# Patient Record
Sex: Male | Born: 1940
Health system: Southern US, Community
[De-identification: ages and names within clinical notes are randomized; demographics above are authoritative.]

## PROBLEM LIST (undated history)

## (undated) DIAGNOSIS — I4510 Unspecified right bundle-branch block: Secondary | ICD-10-CM

## (undated) DIAGNOSIS — A419 Sepsis, unspecified organism: Secondary | ICD-10-CM

## (undated) DIAGNOSIS — N400 Enlarged prostate without lower urinary tract symptoms: Secondary | ICD-10-CM

## (undated) DIAGNOSIS — N1831 Chronic kidney disease, stage 3a: Secondary | ICD-10-CM

## (undated) DIAGNOSIS — I2510 Atherosclerotic heart disease of native coronary artery without angina pectoris: Secondary | ICD-10-CM

## (undated) DIAGNOSIS — C801 Malignant (primary) neoplasm, unspecified: Secondary | ICD-10-CM

## (undated) DIAGNOSIS — H9190 Unspecified hearing loss, unspecified ear: Secondary | ICD-10-CM

## (undated) DIAGNOSIS — R0609 Other forms of dyspnea: Secondary | ICD-10-CM

## (undated) DIAGNOSIS — K219 Gastro-esophageal reflux disease without esophagitis: Secondary | ICD-10-CM

## (undated) DIAGNOSIS — I4891 Unspecified atrial fibrillation: Secondary | ICD-10-CM

## (undated) DIAGNOSIS — F419 Anxiety disorder, unspecified: Secondary | ICD-10-CM

## (undated) DIAGNOSIS — I493 Ventricular premature depolarization: Secondary | ICD-10-CM

## (undated) DIAGNOSIS — K4091 Unilateral inguinal hernia, without obstruction or gangrene, recurrent: Secondary | ICD-10-CM

## (undated) DIAGNOSIS — K250 Acute gastric ulcer with hemorrhage: Secondary | ICD-10-CM

## (undated) DIAGNOSIS — K5790 Diverticulosis of intestine, part unspecified, without perforation or abscess without bleeding: Secondary | ICD-10-CM

## (undated) DIAGNOSIS — H353290 Exudative age-related macular degeneration, unspecified eye, stage unspecified: Secondary | ICD-10-CM

## (undated) DIAGNOSIS — E785 Hyperlipidemia, unspecified: Secondary | ICD-10-CM

## (undated) DIAGNOSIS — M4186 Other forms of scoliosis, lumbar region: Secondary | ICD-10-CM

## (undated) DIAGNOSIS — M1990 Unspecified osteoarthritis, unspecified site: Secondary | ICD-10-CM

## (undated) DIAGNOSIS — T7840XA Allergy, unspecified, initial encounter: Secondary | ICD-10-CM

## (undated) DIAGNOSIS — R0600 Dyspnea, unspecified: Secondary | ICD-10-CM

## (undated) DIAGNOSIS — K4090 Unilateral inguinal hernia, without obstruction or gangrene, not specified as recurrent: Secondary | ICD-10-CM

## (undated) DIAGNOSIS — Z7902 Long term (current) use of antithrombotics/antiplatelets: Secondary | ICD-10-CM

## (undated) DIAGNOSIS — K635 Polyp of colon: Secondary | ICD-10-CM

## (undated) DIAGNOSIS — F329 Major depressive disorder, single episode, unspecified: Secondary | ICD-10-CM

## (undated) DIAGNOSIS — I255 Ischemic cardiomyopathy: Secondary | ICD-10-CM

## (undated) DIAGNOSIS — I509 Heart failure, unspecified: Secondary | ICD-10-CM

## (undated) DIAGNOSIS — K4020 Bilateral inguinal hernia, without obstruction or gangrene, not specified as recurrent: Secondary | ICD-10-CM

## (undated) DIAGNOSIS — H9313 Tinnitus, bilateral: Secondary | ICD-10-CM

## (undated) DIAGNOSIS — R7303 Prediabetes: Secondary | ICD-10-CM

## (undated) DIAGNOSIS — F32A Depression, unspecified: Secondary | ICD-10-CM

## (undated) DIAGNOSIS — K589 Irritable bowel syndrome without diarrhea: Secondary | ICD-10-CM

## (undated) DIAGNOSIS — J189 Pneumonia, unspecified organism: Secondary | ICD-10-CM

## (undated) DIAGNOSIS — E079 Disorder of thyroid, unspecified: Secondary | ICD-10-CM

## (undated) DIAGNOSIS — K439 Ventral hernia without obstruction or gangrene: Secondary | ICD-10-CM

## (undated) DIAGNOSIS — D649 Anemia, unspecified: Secondary | ICD-10-CM

## (undated) DIAGNOSIS — I700 Atherosclerosis of aorta: Secondary | ICD-10-CM

## (undated) DIAGNOSIS — I219 Acute myocardial infarction, unspecified: Secondary | ICD-10-CM

## (undated) DIAGNOSIS — E039 Hypothyroidism, unspecified: Secondary | ICD-10-CM

## (undated) DIAGNOSIS — I499 Cardiac arrhythmia, unspecified: Secondary | ICD-10-CM

## (undated) DIAGNOSIS — I10 Essential (primary) hypertension: Secondary | ICD-10-CM

## (undated) HISTORY — DX: Essential (primary) hypertension: I10

## (undated) HISTORY — DX: Irritable bowel syndrome, unspecified: K58.9

## (undated) HISTORY — DX: Depression, unspecified: F32.A

## (undated) HISTORY — DX: Major depressive disorder, single episode, unspecified: F32.9

## (undated) HISTORY — DX: Anxiety disorder, unspecified: F41.9

## (undated) HISTORY — DX: Hyperlipidemia, unspecified: E78.5

## (undated) HISTORY — DX: Malignant (primary) neoplasm, unspecified: C80.1

## (undated) HISTORY — DX: Allergy, unspecified, initial encounter: T78.40XA

## (undated) HISTORY — DX: Gastro-esophageal reflux disease without esophagitis: K21.9

## (undated) HISTORY — PX: VASECTOMY: SHX75

## (undated) HISTORY — PX: CARDIAC CATHETERIZATION: SHX172

## (undated) HISTORY — PX: EYE SURGERY: SHX253

## (undated) HISTORY — PX: JOINT REPLACEMENT: SHX530

## (undated) HISTORY — DX: Disorder of thyroid, unspecified: E07.9

## (undated) HISTORY — PX: TONSILLECTOMY: SUR1361

---

## 1966-12-25 HISTORY — PX: CHOLECYSTECTOMY: SHX55

## 1996-02-26 HISTORY — PX: HERNIA REPAIR: SHX51

## 1996-04-18 HISTORY — PX: HERNIA REPAIR: SHX51

## 2002-08-07 HISTORY — PX: HERNIA REPAIR: SHX51

## 2009-01-06 ENCOUNTER — Ambulatory Visit: Payer: Self-pay | Admitting: Family Medicine

## 2009-02-02 ENCOUNTER — Ambulatory Visit: Payer: Self-pay | Admitting: Family Medicine

## 2009-12-01 ENCOUNTER — Ambulatory Visit: Payer: Self-pay | Admitting: Cardiology

## 2012-01-02 DIAGNOSIS — M238X9 Other internal derangements of unspecified knee: Secondary | ICD-10-CM | POA: Diagnosis not present

## 2012-01-08 ENCOUNTER — Ambulatory Visit: Payer: Self-pay

## 2012-01-08 DIAGNOSIS — M7120 Synovial cyst of popliteal space [Baker], unspecified knee: Secondary | ICD-10-CM | POA: Diagnosis not present

## 2012-01-08 DIAGNOSIS — IMO0002 Reserved for concepts with insufficient information to code with codable children: Secondary | ICD-10-CM | POA: Diagnosis not present

## 2012-01-29 DIAGNOSIS — L819 Disorder of pigmentation, unspecified: Secondary | ICD-10-CM | POA: Diagnosis not present

## 2012-01-29 DIAGNOSIS — L723 Sebaceous cyst: Secondary | ICD-10-CM | POA: Diagnosis not present

## 2012-01-30 DIAGNOSIS — M238X9 Other internal derangements of unspecified knee: Secondary | ICD-10-CM | POA: Diagnosis not present

## 2012-02-05 ENCOUNTER — Ambulatory Visit: Payer: Self-pay | Admitting: General Practice

## 2012-02-05 DIAGNOSIS — Z01812 Encounter for preprocedural laboratory examination: Secondary | ICD-10-CM | POA: Diagnosis not present

## 2012-02-05 DIAGNOSIS — M2390 Unspecified internal derangement of unspecified knee: Secondary | ICD-10-CM | POA: Diagnosis not present

## 2012-02-07 ENCOUNTER — Ambulatory Visit: Payer: Self-pay | Admitting: Anesthesiology

## 2012-02-07 DIAGNOSIS — I2510 Atherosclerotic heart disease of native coronary artery without angina pectoris: Secondary | ICD-10-CM | POA: Diagnosis not present

## 2012-02-07 DIAGNOSIS — Z87891 Personal history of nicotine dependence: Secondary | ICD-10-CM | POA: Diagnosis not present

## 2012-02-07 DIAGNOSIS — Z79899 Other long term (current) drug therapy: Secondary | ICD-10-CM | POA: Diagnosis not present

## 2012-02-07 DIAGNOSIS — Z01812 Encounter for preprocedural laboratory examination: Secondary | ICD-10-CM | POA: Diagnosis not present

## 2012-02-07 DIAGNOSIS — M2390 Unspecified internal derangement of unspecified knee: Secondary | ICD-10-CM | POA: Diagnosis not present

## 2012-02-07 DIAGNOSIS — M23329 Other meniscus derangements, posterior horn of medial meniscus, unspecified knee: Secondary | ICD-10-CM | POA: Diagnosis not present

## 2012-02-07 LAB — BASIC METABOLIC PANEL
Chloride: 104 mmol/L (ref 98–107)
Co2: 30 mmol/L (ref 21–32)
Creatinine: 0.89 mg/dL (ref 0.60–1.30)
Potassium: 4.5 mmol/L (ref 3.5–5.1)
Sodium: 142 mmol/L (ref 136–145)

## 2012-02-07 LAB — CBC WITH DIFFERENTIAL/PLATELET
Basophil #: 0 10*3/uL (ref 0.0–0.1)
Basophil %: 0.4 %
Eosinophil #: 0.1 10*3/uL (ref 0.0–0.7)
MCHC: 33.5 g/dL (ref 32.0–36.0)
MCV: 96 fL (ref 80–100)
Monocyte #: 0.4 10*3/uL (ref 0.0–0.7)
Neutrophil #: 3.4 10*3/uL (ref 1.4–6.5)
RBC: 4.77 10*6/uL (ref 4.40–5.90)
WBC: 6 10*3/uL (ref 3.8–10.6)

## 2012-02-14 ENCOUNTER — Ambulatory Visit: Payer: Self-pay | Admitting: General Practice

## 2012-02-14 DIAGNOSIS — I2510 Atherosclerotic heart disease of native coronary artery without angina pectoris: Secondary | ICD-10-CM | POA: Diagnosis not present

## 2012-02-14 DIAGNOSIS — M9420 Chondromalacia, unspecified site: Secondary | ICD-10-CM | POA: Diagnosis not present

## 2012-02-14 DIAGNOSIS — M129 Arthropathy, unspecified: Secondary | ICD-10-CM | POA: Diagnosis not present

## 2012-02-14 DIAGNOSIS — IMO0001 Reserved for inherently not codable concepts without codable children: Secondary | ICD-10-CM | POA: Diagnosis not present

## 2012-02-14 DIAGNOSIS — E079 Disorder of thyroid, unspecified: Secondary | ICD-10-CM | POA: Diagnosis not present

## 2012-02-14 DIAGNOSIS — M2390 Unspecified internal derangement of unspecified knee: Secondary | ICD-10-CM | POA: Diagnosis not present

## 2012-02-14 DIAGNOSIS — M2240 Chondromalacia patellae, unspecified knee: Secondary | ICD-10-CM | POA: Diagnosis not present

## 2012-02-14 DIAGNOSIS — Z8262 Family history of osteoporosis: Secondary | ICD-10-CM | POA: Diagnosis not present

## 2012-02-14 DIAGNOSIS — Z79899 Other long term (current) drug therapy: Secondary | ICD-10-CM | POA: Diagnosis not present

## 2012-02-14 DIAGNOSIS — Z87891 Personal history of nicotine dependence: Secondary | ICD-10-CM | POA: Diagnosis not present

## 2012-02-14 DIAGNOSIS — M23329 Other meniscus derangements, posterior horn of medial meniscus, unspecified knee: Secondary | ICD-10-CM | POA: Diagnosis not present

## 2012-02-14 HISTORY — PX: KNEE ARTHROSCOPY: SUR90

## 2012-04-15 DIAGNOSIS — J029 Acute pharyngitis, unspecified: Secondary | ICD-10-CM | POA: Diagnosis not present

## 2012-04-15 DIAGNOSIS — E039 Hypothyroidism, unspecified: Secondary | ICD-10-CM | POA: Diagnosis not present

## 2012-04-15 DIAGNOSIS — H6690 Otitis media, unspecified, unspecified ear: Secondary | ICD-10-CM | POA: Diagnosis not present

## 2012-05-15 DIAGNOSIS — H6120 Impacted cerumen, unspecified ear: Secondary | ICD-10-CM | POA: Diagnosis not present

## 2012-05-15 DIAGNOSIS — H903 Sensorineural hearing loss, bilateral: Secondary | ICD-10-CM | POA: Diagnosis not present

## 2012-05-29 DIAGNOSIS — E039 Hypothyroidism, unspecified: Secondary | ICD-10-CM | POA: Diagnosis not present

## 2012-05-29 DIAGNOSIS — E785 Hyperlipidemia, unspecified: Secondary | ICD-10-CM | POA: Diagnosis not present

## 2012-05-29 DIAGNOSIS — M549 Dorsalgia, unspecified: Secondary | ICD-10-CM | POA: Diagnosis not present

## 2012-05-29 DIAGNOSIS — I2510 Atherosclerotic heart disease of native coronary artery without angina pectoris: Secondary | ICD-10-CM | POA: Diagnosis not present

## 2012-05-30 DIAGNOSIS — E785 Hyperlipidemia, unspecified: Secondary | ICD-10-CM | POA: Diagnosis not present

## 2012-05-30 DIAGNOSIS — E039 Hypothyroidism, unspecified: Secondary | ICD-10-CM | POA: Diagnosis not present

## 2012-05-30 DIAGNOSIS — Z79899 Other long term (current) drug therapy: Secondary | ICD-10-CM | POA: Diagnosis not present

## 2012-06-05 DIAGNOSIS — R0609 Other forms of dyspnea: Secondary | ICD-10-CM | POA: Diagnosis not present

## 2012-06-05 DIAGNOSIS — I4949 Other premature depolarization: Secondary | ICD-10-CM | POA: Diagnosis not present

## 2012-06-05 DIAGNOSIS — R0989 Other specified symptoms and signs involving the circulatory and respiratory systems: Secondary | ICD-10-CM | POA: Diagnosis not present

## 2012-08-07 DIAGNOSIS — R42 Dizziness and giddiness: Secondary | ICD-10-CM | POA: Diagnosis not present

## 2012-08-07 DIAGNOSIS — R5381 Other malaise: Secondary | ICD-10-CM | POA: Diagnosis not present

## 2012-08-07 DIAGNOSIS — E785 Hyperlipidemia, unspecified: Secondary | ICD-10-CM | POA: Diagnosis not present

## 2012-08-07 DIAGNOSIS — E039 Hypothyroidism, unspecified: Secondary | ICD-10-CM | POA: Diagnosis not present

## 2012-08-16 DIAGNOSIS — I10 Essential (primary) hypertension: Secondary | ICD-10-CM | POA: Diagnosis not present

## 2012-08-16 DIAGNOSIS — R9431 Abnormal electrocardiogram [ECG] [EKG]: Secondary | ICD-10-CM | POA: Diagnosis not present

## 2012-08-16 DIAGNOSIS — I4891 Unspecified atrial fibrillation: Secondary | ICD-10-CM | POA: Diagnosis not present

## 2012-08-16 DIAGNOSIS — R42 Dizziness and giddiness: Secondary | ICD-10-CM | POA: Diagnosis not present

## 2012-09-17 DIAGNOSIS — M1710 Unilateral primary osteoarthritis, unspecified knee: Secondary | ICD-10-CM | POA: Diagnosis not present

## 2012-09-19 DIAGNOSIS — Z23 Encounter for immunization: Secondary | ICD-10-CM | POA: Diagnosis not present

## 2012-11-27 DIAGNOSIS — Z1212 Encounter for screening for malignant neoplasm of rectum: Secondary | ICD-10-CM | POA: Diagnosis not present

## 2012-11-27 DIAGNOSIS — Z125 Encounter for screening for malignant neoplasm of prostate: Secondary | ICD-10-CM | POA: Diagnosis not present

## 2012-11-27 DIAGNOSIS — Z1339 Encounter for screening examination for other mental health and behavioral disorders: Secondary | ICD-10-CM | POA: Diagnosis not present

## 2012-11-27 DIAGNOSIS — Z1331 Encounter for screening for depression: Secondary | ICD-10-CM | POA: Diagnosis not present

## 2012-11-27 DIAGNOSIS — Z0000 Encounter for general adult medical examination without abnormal findings: Secondary | ICD-10-CM | POA: Diagnosis not present

## 2012-11-27 DIAGNOSIS — R9431 Abnormal electrocardiogram [ECG] [EKG]: Secondary | ICD-10-CM | POA: Diagnosis not present

## 2012-12-03 DIAGNOSIS — I4949 Other premature depolarization: Secondary | ICD-10-CM | POA: Diagnosis not present

## 2012-12-03 DIAGNOSIS — I2510 Atherosclerotic heart disease of native coronary artery without angina pectoris: Secondary | ICD-10-CM | POA: Diagnosis not present

## 2012-12-25 HISTORY — PX: COLONOSCOPY: SHX174

## 2013-04-03 DIAGNOSIS — Z1339 Encounter for screening examination for other mental health and behavioral disorders: Secondary | ICD-10-CM | POA: Diagnosis not present

## 2013-04-03 DIAGNOSIS — R9431 Abnormal electrocardiogram [ECG] [EKG]: Secondary | ICD-10-CM | POA: Diagnosis not present

## 2013-04-03 DIAGNOSIS — J320 Chronic maxillary sinusitis: Secondary | ICD-10-CM | POA: Diagnosis not present

## 2013-04-03 DIAGNOSIS — Z1331 Encounter for screening for depression: Secondary | ICD-10-CM | POA: Diagnosis not present

## 2013-04-21 ENCOUNTER — Ambulatory Visit: Payer: Self-pay | Admitting: Gastroenterology

## 2013-04-21 DIAGNOSIS — E079 Disorder of thyroid, unspecified: Secondary | ICD-10-CM | POA: Diagnosis not present

## 2013-04-21 DIAGNOSIS — Z1211 Encounter for screening for malignant neoplasm of colon: Secondary | ICD-10-CM | POA: Diagnosis not present

## 2013-04-21 DIAGNOSIS — Z79899 Other long term (current) drug therapy: Secondary | ICD-10-CM | POA: Diagnosis not present

## 2013-04-21 DIAGNOSIS — Z885 Allergy status to narcotic agent status: Secondary | ICD-10-CM | POA: Diagnosis not present

## 2013-04-21 DIAGNOSIS — Z7982 Long term (current) use of aspirin: Secondary | ICD-10-CM | POA: Diagnosis not present

## 2013-04-21 DIAGNOSIS — K5730 Diverticulosis of large intestine without perforation or abscess without bleeding: Secondary | ICD-10-CM | POA: Diagnosis not present

## 2013-04-21 LAB — HM COLONOSCOPY

## 2013-06-18 DIAGNOSIS — H9319 Tinnitus, unspecified ear: Secondary | ICD-10-CM | POA: Diagnosis not present

## 2013-06-18 DIAGNOSIS — H903 Sensorineural hearing loss, bilateral: Secondary | ICD-10-CM | POA: Diagnosis not present

## 2013-06-18 DIAGNOSIS — Z822 Family history of deafness and hearing loss: Secondary | ICD-10-CM | POA: Diagnosis not present

## 2013-06-25 DIAGNOSIS — H9319 Tinnitus, unspecified ear: Secondary | ICD-10-CM | POA: Diagnosis not present

## 2013-06-25 DIAGNOSIS — R9431 Abnormal electrocardiogram [ECG] [EKG]: Secondary | ICD-10-CM | POA: Diagnosis not present

## 2013-06-25 DIAGNOSIS — E039 Hypothyroidism, unspecified: Secondary | ICD-10-CM | POA: Diagnosis not present

## 2013-06-25 DIAGNOSIS — H9190 Unspecified hearing loss, unspecified ear: Secondary | ICD-10-CM | POA: Diagnosis not present

## 2013-08-08 DIAGNOSIS — M1710 Unilateral primary osteoarthritis, unspecified knee: Secondary | ICD-10-CM | POA: Diagnosis not present

## 2013-08-26 DIAGNOSIS — M1710 Unilateral primary osteoarthritis, unspecified knee: Secondary | ICD-10-CM | POA: Diagnosis not present

## 2013-09-02 DIAGNOSIS — R9431 Abnormal electrocardiogram [ECG] [EKG]: Secondary | ICD-10-CM | POA: Diagnosis not present

## 2013-09-02 DIAGNOSIS — M129 Arthropathy, unspecified: Secondary | ICD-10-CM | POA: Diagnosis not present

## 2013-09-02 DIAGNOSIS — H9190 Unspecified hearing loss, unspecified ear: Secondary | ICD-10-CM | POA: Diagnosis not present

## 2013-09-02 DIAGNOSIS — M1710 Unilateral primary osteoarthritis, unspecified knee: Secondary | ICD-10-CM | POA: Diagnosis not present

## 2013-09-02 DIAGNOSIS — L509 Urticaria, unspecified: Secondary | ICD-10-CM | POA: Diagnosis not present

## 2013-09-09 DIAGNOSIS — M1710 Unilateral primary osteoarthritis, unspecified knee: Secondary | ICD-10-CM | POA: Diagnosis not present

## 2013-09-24 DIAGNOSIS — Z23 Encounter for immunization: Secondary | ICD-10-CM | POA: Diagnosis not present

## 2013-09-30 DIAGNOSIS — M1710 Unilateral primary osteoarthritis, unspecified knee: Secondary | ICD-10-CM | POA: Diagnosis not present

## 2013-11-07 DIAGNOSIS — M129 Arthropathy, unspecified: Secondary | ICD-10-CM | POA: Diagnosis not present

## 2013-11-07 DIAGNOSIS — J069 Acute upper respiratory infection, unspecified: Secondary | ICD-10-CM | POA: Diagnosis not present

## 2013-11-07 DIAGNOSIS — H9190 Unspecified hearing loss, unspecified ear: Secondary | ICD-10-CM | POA: Diagnosis not present

## 2013-11-07 DIAGNOSIS — R9431 Abnormal electrocardiogram [ECG] [EKG]: Secondary | ICD-10-CM | POA: Diagnosis not present

## 2013-12-03 DIAGNOSIS — I4949 Other premature depolarization: Secondary | ICD-10-CM | POA: Diagnosis not present

## 2013-12-03 DIAGNOSIS — I2510 Atherosclerotic heart disease of native coronary artery without angina pectoris: Secondary | ICD-10-CM | POA: Diagnosis not present

## 2013-12-29 DIAGNOSIS — R5381 Other malaise: Secondary | ICD-10-CM | POA: Diagnosis not present

## 2013-12-29 DIAGNOSIS — E039 Hypothyroidism, unspecified: Secondary | ICD-10-CM | POA: Diagnosis not present

## 2013-12-29 DIAGNOSIS — R5383 Other fatigue: Secondary | ICD-10-CM | POA: Diagnosis not present

## 2014-01-22 DIAGNOSIS — M1710 Unilateral primary osteoarthritis, unspecified knee: Secondary | ICD-10-CM | POA: Diagnosis not present

## 2014-01-22 DIAGNOSIS — IMO0002 Reserved for concepts with insufficient information to code with codable children: Secondary | ICD-10-CM | POA: Diagnosis not present

## 2014-02-13 DIAGNOSIS — M129 Arthropathy, unspecified: Secondary | ICD-10-CM | POA: Diagnosis not present

## 2014-02-13 DIAGNOSIS — L738 Other specified follicular disorders: Secondary | ICD-10-CM | POA: Diagnosis not present

## 2014-02-13 DIAGNOSIS — L678 Other hair color and hair shaft abnormalities: Secondary | ICD-10-CM | POA: Diagnosis not present

## 2014-02-13 DIAGNOSIS — H9190 Unspecified hearing loss, unspecified ear: Secondary | ICD-10-CM | POA: Diagnosis not present

## 2014-02-13 DIAGNOSIS — R9431 Abnormal electrocardiogram [ECG] [EKG]: Secondary | ICD-10-CM | POA: Diagnosis not present

## 2014-02-16 ENCOUNTER — Ambulatory Visit: Payer: Self-pay | Admitting: Family Medicine

## 2014-02-16 DIAGNOSIS — J189 Pneumonia, unspecified organism: Secondary | ICD-10-CM | POA: Diagnosis not present

## 2014-02-16 DIAGNOSIS — J9819 Other pulmonary collapse: Secondary | ICD-10-CM | POA: Diagnosis not present

## 2014-02-16 DIAGNOSIS — J209 Acute bronchitis, unspecified: Secondary | ICD-10-CM | POA: Diagnosis not present

## 2014-02-16 DIAGNOSIS — R059 Cough, unspecified: Secondary | ICD-10-CM | POA: Diagnosis not present

## 2014-02-16 DIAGNOSIS — R05 Cough: Secondary | ICD-10-CM | POA: Diagnosis not present

## 2014-02-16 DIAGNOSIS — R9431 Abnormal electrocardiogram [ECG] [EKG]: Secondary | ICD-10-CM | POA: Diagnosis not present

## 2014-03-02 DIAGNOSIS — H903 Sensorineural hearing loss, bilateral: Secondary | ICD-10-CM | POA: Diagnosis not present

## 2014-03-02 DIAGNOSIS — H9319 Tinnitus, unspecified ear: Secondary | ICD-10-CM | POA: Diagnosis not present

## 2014-03-02 DIAGNOSIS — H905 Unspecified sensorineural hearing loss: Secondary | ICD-10-CM | POA: Diagnosis not present

## 2014-03-04 ENCOUNTER — Other Ambulatory Visit: Payer: Self-pay | Admitting: Otolaryngology

## 2014-03-04 DIAGNOSIS — H9319 Tinnitus, unspecified ear: Secondary | ICD-10-CM

## 2014-03-04 DIAGNOSIS — H905 Unspecified sensorineural hearing loss: Secondary | ICD-10-CM

## 2014-03-04 DIAGNOSIS — H903 Sensorineural hearing loss, bilateral: Secondary | ICD-10-CM

## 2014-03-06 DIAGNOSIS — R9431 Abnormal electrocardiogram [ECG] [EKG]: Secondary | ICD-10-CM | POA: Diagnosis not present

## 2014-03-06 DIAGNOSIS — J209 Acute bronchitis, unspecified: Secondary | ICD-10-CM | POA: Diagnosis not present

## 2014-03-06 DIAGNOSIS — R05 Cough: Secondary | ICD-10-CM | POA: Diagnosis not present

## 2014-03-06 DIAGNOSIS — R059 Cough, unspecified: Secondary | ICD-10-CM | POA: Diagnosis not present

## 2014-03-06 DIAGNOSIS — J40 Bronchitis, not specified as acute or chronic: Secondary | ICD-10-CM | POA: Diagnosis not present

## 2014-03-10 ENCOUNTER — Ambulatory Visit
Admission: RE | Admit: 2014-03-10 | Discharge: 2014-03-10 | Disposition: A | Discharge status: Home / Self Care | Payer: Medicare Other | Source: Ambulatory Visit | Attending: Otolaryngology | Admitting: Otolaryngology

## 2014-03-10 DIAGNOSIS — H9319 Tinnitus, unspecified ear: Secondary | ICD-10-CM | POA: Diagnosis not present

## 2014-03-10 DIAGNOSIS — H903 Sensorineural hearing loss, bilateral: Secondary | ICD-10-CM

## 2014-03-10 DIAGNOSIS — H905 Unspecified sensorineural hearing loss: Secondary | ICD-10-CM

## 2014-03-10 MED ORDER — GADOBENATE DIMEGLUMINE 529 MG/ML IV SOLN
17.0000 mL | Freq: Once | INTRAVENOUS | Status: AC | PRN
  Administered 2014-03-10: 17 mL via INTRAVENOUS

## 2014-04-10 DIAGNOSIS — M1710 Unilateral primary osteoarthritis, unspecified knee: Secondary | ICD-10-CM | POA: Diagnosis not present

## 2014-04-10 DIAGNOSIS — IMO0002 Reserved for concepts with insufficient information to code with codable children: Secondary | ICD-10-CM | POA: Diagnosis not present

## 2014-04-17 DIAGNOSIS — R002 Palpitations: Secondary | ICD-10-CM | POA: Diagnosis not present

## 2014-04-17 DIAGNOSIS — R0609 Other forms of dyspnea: Secondary | ICD-10-CM | POA: Diagnosis not present

## 2014-04-17 DIAGNOSIS — R0989 Other specified symptoms and signs involving the circulatory and respiratory systems: Secondary | ICD-10-CM | POA: Diagnosis not present

## 2014-04-17 DIAGNOSIS — I4949 Other premature depolarization: Secondary | ICD-10-CM | POA: Diagnosis not present

## 2014-04-17 DIAGNOSIS — R0789 Other chest pain: Secondary | ICD-10-CM | POA: Diagnosis not present

## 2014-05-07 DIAGNOSIS — R0602 Shortness of breath: Secondary | ICD-10-CM | POA: Diagnosis not present

## 2014-05-14 DIAGNOSIS — I4949 Other premature depolarization: Secondary | ICD-10-CM | POA: Diagnosis not present

## 2014-05-14 DIAGNOSIS — I2510 Atherosclerotic heart disease of native coronary artery without angina pectoris: Secondary | ICD-10-CM | POA: Diagnosis not present

## 2014-05-14 DIAGNOSIS — I209 Angina pectoris, unspecified: Secondary | ICD-10-CM | POA: Diagnosis not present

## 2014-05-19 DIAGNOSIS — IMO0002 Reserved for concepts with insufficient information to code with codable children: Secondary | ICD-10-CM | POA: Diagnosis not present

## 2014-05-19 DIAGNOSIS — M1710 Unilateral primary osteoarthritis, unspecified knee: Secondary | ICD-10-CM | POA: Diagnosis not present

## 2014-05-26 DIAGNOSIS — IMO0002 Reserved for concepts with insufficient information to code with codable children: Secondary | ICD-10-CM | POA: Diagnosis not present

## 2014-05-26 DIAGNOSIS — M1710 Unilateral primary osteoarthritis, unspecified knee: Secondary | ICD-10-CM | POA: Diagnosis not present

## 2014-06-02 DIAGNOSIS — M1710 Unilateral primary osteoarthritis, unspecified knee: Secondary | ICD-10-CM | POA: Diagnosis not present

## 2014-06-02 DIAGNOSIS — IMO0002 Reserved for concepts with insufficient information to code with codable children: Secondary | ICD-10-CM | POA: Diagnosis not present

## 2014-06-29 DIAGNOSIS — IMO0002 Reserved for concepts with insufficient information to code with codable children: Secondary | ICD-10-CM | POA: Diagnosis not present

## 2014-06-29 DIAGNOSIS — M1710 Unilateral primary osteoarthritis, unspecified knee: Secondary | ICD-10-CM | POA: Diagnosis not present

## 2014-07-08 DIAGNOSIS — H1130 Conjunctival hemorrhage, unspecified eye: Secondary | ICD-10-CM | POA: Diagnosis not present

## 2014-10-01 DIAGNOSIS — Z23 Encounter for immunization: Secondary | ICD-10-CM | POA: Diagnosis not present

## 2014-10-15 DIAGNOSIS — H0016 Chalazion left eye, unspecified eyelid: Secondary | ICD-10-CM | POA: Diagnosis not present

## 2014-11-05 DIAGNOSIS — M1712 Unilateral primary osteoarthritis, left knee: Secondary | ICD-10-CM | POA: Diagnosis not present

## 2014-11-25 DIAGNOSIS — J0100 Acute maxillary sinusitis, unspecified: Secondary | ICD-10-CM | POA: Diagnosis not present

## 2014-11-25 DIAGNOSIS — R05 Cough: Secondary | ICD-10-CM | POA: Diagnosis not present

## 2014-11-25 DIAGNOSIS — J40 Bronchitis, not specified as acute or chronic: Secondary | ICD-10-CM | POA: Diagnosis not present

## 2014-12-08 DIAGNOSIS — I493 Ventricular premature depolarization: Secondary | ICD-10-CM | POA: Diagnosis not present

## 2014-12-08 DIAGNOSIS — I25118 Atherosclerotic heart disease of native coronary artery with other forms of angina pectoris: Secondary | ICD-10-CM | POA: Diagnosis not present

## 2014-12-22 DIAGNOSIS — M79671 Pain in right foot: Secondary | ICD-10-CM | POA: Diagnosis not present

## 2014-12-22 DIAGNOSIS — M545 Low back pain: Secondary | ICD-10-CM | POA: Diagnosis not present

## 2014-12-22 DIAGNOSIS — M1990 Unspecified osteoarthritis, unspecified site: Secondary | ICD-10-CM | POA: Diagnosis not present

## 2014-12-22 DIAGNOSIS — M25562 Pain in left knee: Secondary | ICD-10-CM | POA: Diagnosis not present

## 2015-01-06 DIAGNOSIS — H2513 Age-related nuclear cataract, bilateral: Secondary | ICD-10-CM | POA: Diagnosis not present

## 2015-01-06 DIAGNOSIS — M150 Primary generalized (osteo)arthritis: Secondary | ICD-10-CM | POA: Diagnosis not present

## 2015-01-18 DIAGNOSIS — K279 Peptic ulcer, site unspecified, unspecified as acute or chronic, without hemorrhage or perforation: Secondary | ICD-10-CM | POA: Diagnosis not present

## 2015-01-18 DIAGNOSIS — R1084 Generalized abdominal pain: Secondary | ICD-10-CM | POA: Diagnosis not present

## 2015-01-18 DIAGNOSIS — J40 Bronchitis, not specified as acute or chronic: Secondary | ICD-10-CM | POA: Diagnosis not present

## 2015-01-18 LAB — BASIC METABOLIC PANEL
BUN: 23 mg/dL — AB (ref 4–21)
Creatinine: 0.9 mg/dL (ref 0.6–1.3)
Glucose: 98 mg/dL
Potassium: 4.3 mmol/L (ref 3.4–5.3)
Sodium: 142 mmol/L (ref 137–147)

## 2015-01-18 LAB — HEPATIC FUNCTION PANEL
ALT: 13 U/L (ref 10–40)
AST: 16 U/L (ref 14–40)

## 2015-01-18 LAB — CBC AND DIFFERENTIAL
HCT: 46 % (ref 41–53)
Hemoglobin: 15.2 g/dL (ref 13.5–17.5)
WBC: 8.4 10^3/mL

## 2015-01-28 DIAGNOSIS — M1712 Unilateral primary osteoarthritis, left knee: Secondary | ICD-10-CM | POA: Diagnosis not present

## 2015-02-02 DIAGNOSIS — M1712 Unilateral primary osteoarthritis, left knee: Secondary | ICD-10-CM | POA: Diagnosis not present

## 2015-02-02 DIAGNOSIS — M179 Osteoarthritis of knee, unspecified: Secondary | ICD-10-CM | POA: Diagnosis not present

## 2015-03-17 ENCOUNTER — Ambulatory Visit: Payer: Self-pay | Admitting: General Practice

## 2015-03-17 DIAGNOSIS — M1712 Unilateral primary osteoarthritis, left knee: Secondary | ICD-10-CM | POA: Diagnosis not present

## 2015-03-17 DIAGNOSIS — Z01812 Encounter for preprocedural laboratory examination: Secondary | ICD-10-CM | POA: Diagnosis not present

## 2015-03-19 LAB — URINE CULTURE

## 2015-03-22 LAB — MRSA PCR SCREENING

## 2015-03-29 ENCOUNTER — Inpatient Hospital Stay
Admit: 2015-03-29 | Disposition: A | Discharge status: Home / Self Care | Payer: Self-pay | Attending: General Practice | Admitting: General Practice

## 2015-03-29 DIAGNOSIS — Z8249 Family history of ischemic heart disease and other diseases of the circulatory system: Secondary | ICD-10-CM | POA: Diagnosis not present

## 2015-03-29 DIAGNOSIS — Z9852 Vasectomy status: Secondary | ICD-10-CM | POA: Diagnosis not present

## 2015-03-29 DIAGNOSIS — Z791 Long term (current) use of non-steroidal anti-inflammatories (NSAID): Secondary | ICD-10-CM | POA: Diagnosis not present

## 2015-03-29 DIAGNOSIS — Z8262 Family history of osteoporosis: Secondary | ICD-10-CM | POA: Diagnosis not present

## 2015-03-29 DIAGNOSIS — M1712 Unilateral primary osteoarthritis, left knee: Secondary | ICD-10-CM | POA: Diagnosis not present

## 2015-03-29 DIAGNOSIS — F419 Anxiety disorder, unspecified: Secondary | ICD-10-CM | POA: Diagnosis present

## 2015-03-29 DIAGNOSIS — I493 Ventricular premature depolarization: Secondary | ICD-10-CM | POA: Diagnosis present

## 2015-03-29 DIAGNOSIS — H918X9 Other specified hearing loss, unspecified ear: Secondary | ICD-10-CM | POA: Diagnosis present

## 2015-03-29 DIAGNOSIS — R0609 Other forms of dyspnea: Secondary | ICD-10-CM | POA: Diagnosis present

## 2015-03-29 DIAGNOSIS — Z471 Aftercare following joint replacement surgery: Secondary | ICD-10-CM | POA: Diagnosis not present

## 2015-03-29 DIAGNOSIS — E039 Hypothyroidism, unspecified: Secondary | ICD-10-CM | POA: Diagnosis present

## 2015-03-29 DIAGNOSIS — Z7982 Long term (current) use of aspirin: Secondary | ICD-10-CM | POA: Diagnosis not present

## 2015-03-29 DIAGNOSIS — Z9049 Acquired absence of other specified parts of digestive tract: Secondary | ICD-10-CM | POA: Diagnosis present

## 2015-03-29 DIAGNOSIS — D62 Acute posthemorrhagic anemia: Secondary | ICD-10-CM | POA: Diagnosis present

## 2015-03-29 DIAGNOSIS — Z96652 Presence of left artificial knee joint: Secondary | ICD-10-CM | POA: Diagnosis not present

## 2015-03-29 DIAGNOSIS — I2510 Atherosclerotic heart disease of native coronary artery without angina pectoris: Secondary | ICD-10-CM | POA: Diagnosis present

## 2015-03-29 DIAGNOSIS — Z79891 Long term (current) use of opiate analgesic: Secondary | ICD-10-CM | POA: Diagnosis not present

## 2015-03-29 DIAGNOSIS — Z79899 Other long term (current) drug therapy: Secondary | ICD-10-CM | POA: Diagnosis not present

## 2015-03-29 HISTORY — PX: TOTAL KNEE ARTHROPLASTY: SHX125

## 2015-03-30 LAB — HEMOGLOBIN: HGB: 12.5 g/dL — ABNORMAL LOW (ref 13.0–18.0)

## 2015-03-30 LAB — BASIC METABOLIC PANEL
ANION GAP: 5 — AB (ref 7–16)
BUN: 18 mg/dL
CO2: 26 mmol/L
Calcium, Total: 8.3 mg/dL — ABNORMAL LOW
Chloride: 105 mmol/L
Creatinine: 0.79 mg/dL
EGFR (Non-African Amer.): >60
GLUCOSE: 115 mg/dL — AB
Potassium: 3.8 mmol/L
Sodium: 136 mmol/L

## 2015-03-30 LAB — PLATELET COUNT: Platelet: 137 10*3/uL — ABNORMAL LOW (ref 150–440)

## 2015-03-31 LAB — BASIC METABOLIC PANEL
Anion Gap: 6 — ABNORMAL LOW (ref 7–16)
BUN: 14 mg/dL
Calcium, Total: 8.4 mg/dL — ABNORMAL LOW
Chloride: 102 mmol/L
Co2: 29 mmol/L
Creatinine: 0.79 mg/dL
EGFR (African American): >60
Glucose: 100 mg/dL — ABNORMAL HIGH
Potassium: 3.6 mmol/L
Sodium: 137 mmol/L

## 2015-03-31 LAB — PLATELET COUNT: PLATELETS: 147 10*3/uL — AB (ref 150–440)

## 2015-03-31 LAB — HEMOGLOBIN: HGB: 12.2 g/dL — ABNORMAL LOW (ref 13.0–18.0)

## 2015-04-02 DIAGNOSIS — Z96652 Presence of left artificial knee joint: Secondary | ICD-10-CM | POA: Diagnosis not present

## 2015-04-02 DIAGNOSIS — F419 Anxiety disorder, unspecified: Secondary | ICD-10-CM | POA: Diagnosis not present

## 2015-04-02 DIAGNOSIS — Z471 Aftercare following joint replacement surgery: Secondary | ICD-10-CM | POA: Diagnosis not present

## 2015-04-02 DIAGNOSIS — I2510 Atherosclerotic heart disease of native coronary artery without angina pectoris: Secondary | ICD-10-CM | POA: Diagnosis not present

## 2015-04-02 DIAGNOSIS — M1990 Unspecified osteoarthritis, unspecified site: Secondary | ICD-10-CM | POA: Diagnosis not present

## 2015-04-03 DIAGNOSIS — M1990 Unspecified osteoarthritis, unspecified site: Secondary | ICD-10-CM | POA: Diagnosis not present

## 2015-04-03 DIAGNOSIS — F419 Anxiety disorder, unspecified: Secondary | ICD-10-CM | POA: Diagnosis not present

## 2015-04-03 DIAGNOSIS — Z471 Aftercare following joint replacement surgery: Secondary | ICD-10-CM | POA: Diagnosis not present

## 2015-04-03 DIAGNOSIS — Z96652 Presence of left artificial knee joint: Secondary | ICD-10-CM | POA: Diagnosis not present

## 2015-04-03 DIAGNOSIS — I2510 Atherosclerotic heart disease of native coronary artery without angina pectoris: Secondary | ICD-10-CM | POA: Diagnosis not present

## 2015-04-06 DIAGNOSIS — I2510 Atherosclerotic heart disease of native coronary artery without angina pectoris: Secondary | ICD-10-CM | POA: Diagnosis not present

## 2015-04-06 DIAGNOSIS — F419 Anxiety disorder, unspecified: Secondary | ICD-10-CM | POA: Diagnosis not present

## 2015-04-06 DIAGNOSIS — Z96652 Presence of left artificial knee joint: Secondary | ICD-10-CM | POA: Diagnosis not present

## 2015-04-06 DIAGNOSIS — Z471 Aftercare following joint replacement surgery: Secondary | ICD-10-CM | POA: Diagnosis not present

## 2015-04-06 DIAGNOSIS — M1990 Unspecified osteoarthritis, unspecified site: Secondary | ICD-10-CM | POA: Diagnosis not present

## 2015-04-07 DIAGNOSIS — F419 Anxiety disorder, unspecified: Secondary | ICD-10-CM | POA: Diagnosis not present

## 2015-04-07 DIAGNOSIS — M1990 Unspecified osteoarthritis, unspecified site: Secondary | ICD-10-CM | POA: Diagnosis not present

## 2015-04-07 DIAGNOSIS — Z96652 Presence of left artificial knee joint: Secondary | ICD-10-CM | POA: Diagnosis not present

## 2015-04-07 DIAGNOSIS — Z471 Aftercare following joint replacement surgery: Secondary | ICD-10-CM | POA: Diagnosis not present

## 2015-04-07 DIAGNOSIS — I2510 Atherosclerotic heart disease of native coronary artery without angina pectoris: Secondary | ICD-10-CM | POA: Diagnosis not present

## 2015-04-08 DIAGNOSIS — I2510 Atherosclerotic heart disease of native coronary artery without angina pectoris: Secondary | ICD-10-CM | POA: Diagnosis not present

## 2015-04-08 DIAGNOSIS — Z471 Aftercare following joint replacement surgery: Secondary | ICD-10-CM | POA: Diagnosis not present

## 2015-04-08 DIAGNOSIS — F419 Anxiety disorder, unspecified: Secondary | ICD-10-CM | POA: Diagnosis not present

## 2015-04-08 DIAGNOSIS — M1990 Unspecified osteoarthritis, unspecified site: Secondary | ICD-10-CM | POA: Diagnosis not present

## 2015-04-08 DIAGNOSIS — Z96652 Presence of left artificial knee joint: Secondary | ICD-10-CM | POA: Diagnosis not present

## 2015-04-09 DIAGNOSIS — Z471 Aftercare following joint replacement surgery: Secondary | ICD-10-CM | POA: Diagnosis not present

## 2015-04-09 DIAGNOSIS — F419 Anxiety disorder, unspecified: Secondary | ICD-10-CM | POA: Diagnosis not present

## 2015-04-09 DIAGNOSIS — I2510 Atherosclerotic heart disease of native coronary artery without angina pectoris: Secondary | ICD-10-CM | POA: Diagnosis not present

## 2015-04-09 DIAGNOSIS — M1990 Unspecified osteoarthritis, unspecified site: Secondary | ICD-10-CM | POA: Diagnosis not present

## 2015-04-09 DIAGNOSIS — Z96652 Presence of left artificial knee joint: Secondary | ICD-10-CM | POA: Diagnosis not present

## 2015-04-12 DIAGNOSIS — I2510 Atherosclerotic heart disease of native coronary artery without angina pectoris: Secondary | ICD-10-CM | POA: Diagnosis not present

## 2015-04-12 DIAGNOSIS — F419 Anxiety disorder, unspecified: Secondary | ICD-10-CM | POA: Diagnosis not present

## 2015-04-12 DIAGNOSIS — Z471 Aftercare following joint replacement surgery: Secondary | ICD-10-CM | POA: Diagnosis not present

## 2015-04-12 DIAGNOSIS — M1990 Unspecified osteoarthritis, unspecified site: Secondary | ICD-10-CM | POA: Diagnosis not present

## 2015-04-12 DIAGNOSIS — Z96652 Presence of left artificial knee joint: Secondary | ICD-10-CM | POA: Diagnosis not present

## 2015-04-13 DIAGNOSIS — Z96652 Presence of left artificial knee joint: Secondary | ICD-10-CM | POA: Diagnosis not present

## 2015-04-13 DIAGNOSIS — Z96659 Presence of unspecified artificial knee joint: Secondary | ICD-10-CM | POA: Insufficient documentation

## 2015-04-14 DIAGNOSIS — Z96652 Presence of left artificial knee joint: Secondary | ICD-10-CM | POA: Diagnosis not present

## 2015-04-16 DIAGNOSIS — Z96652 Presence of left artificial knee joint: Secondary | ICD-10-CM | POA: Diagnosis not present

## 2015-04-18 NOTE — Op Note (Signed)
PATIENT NAME:  Gary Mueller, Gary Mueller MR#:  [id number] DATE OF BIRTH:  June 17, 1941  DATE OF PROCEDURE:  02/14/2012  PREOPERATIVE DIAGNOSIS: Internal derangement of the left knee.   POSTOPERATIVE DIAGNOSES:  1. Tear of the posterior horn medial meniscus, left knee.  2. 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   PROCEDURES PERFORMED:  1. Left knee arthroscopy. 2. Partial medial meniscectomy. 3. Chondroplasty of the medial and lateral compartments.   SURGEON: Laurice Record. Holley Bouche., MD   ANESTHESIA: General.   ESTIMATED BLOOD LOSS: Minimal.   FLUIDS REPLACED: 800 mL of Crystalloid.   DRAINS: None.   TOURNIQUET TIME: Not used.   INDICATIONS FOR SURGERY: The patient is a 74 year old male who has been seen for complaints of left knee pain and swelling. MRI demonstrated findings consistent with meniscal pathology. After discussion of the risks and benefits of surgical intervention, the patient expressed his understanding of the risks and benefits and agreed with plans for surgical intervention.   PROCEDURE IN DETAIL: The patient was brought in the operating room and, after adequate general anesthesia was achieved, tourniquet was placed on the patient's left thigh and leg was placed in a leg holder. All bony prominences were well padded. The patient's left knee and leg were cleaned and prepped with alcohol and DuraPrep and draped in the usual sterile fashion. A "time-out" was performed as per usual protocol. The anticipated portal sites were injected with 0.25% Marcaine with epinephrine. An anterolateral portal was created and cannula was inserted. The scope was inserted and the knee was distended with fluid using the DePuy Mitek pump. Scope was advanced down the medial gutter into the medial compartment of the knee. Under visualization with the scope, an anteromedial portal was created and hook probe was inserted.  Inspection of the medial compartment demonstrated a flap-type degenerative tear of the posterior horn of the medial meniscus. The flap had actually become lodged between the epicondyle and the capsule medially. The tear was debrided using meniscal punches and a 4.5 mm shaver. Transition zone was trimmed and contoured accordingly using a 4.5 mm shaver. Some additional degenerative changes were noted along the more posterior aspect and this was also debrided using meniscal punches and 4.5 mm shaver. Final contouring was performed using a 50 degree ArthroCare wand. The remaining rim of meniscus was probed and felt to be stable. Anterior horn of the medial meniscus was visualized and probed and felt to be stable. There were changes of grade III chondromalacia involving the medial femoral condyle. These areas were debrided and margins contoured using the 50 degree ArthroCare wand. Lesser changes were noted to the plateau.   The scope was then advanced into the intercondylar notch. Anterior cruciate ligament was visualized and probed and felt to be stable. Scope was removed from the anterolateral portal and reinserted via the anteromedial portal so as to better visualize the lateral compartment. The articular surface along the lateral femoral condyle was in reasonably good condition. There were some changes of grade II to early grade III changes involving the lateral tibial plateau. These were debrided and contoured using the 50 degree ArthroCare wand. Some minor fraying of the inner rim of the lateral meniscus was noted. These areas were debrided using the ArthroCare wand. The remaining portion of the meniscus was probed and felt to be stable. Finally, scope was positioned so as to visualize the patellofemoral articulation. Good patellar tracking was noted. Articular surface was in  reasonably good condition.   The knee was irrigated with copious amounts of fluid and then suctioned dry. The anterolateral portal was  reapproximated using #3-0 nylon. A combination of 0.25% Marcaine with epinephrine and 4 mg morphine was injected via the scope. Scope was removed and the anteromedial portal was reapproximated using #3-0 nylon. Sterile dressing was applied followed by application of an ice wrap.       The patient tolerated the procedure well. He was transported to the recovery room in stable condition.   ____________________________ Laurice Record. Holley Bouche., MD jph:drc D: 02/15/2012 06:41:05 ET T: 02/15/2012 09:41:41 ET JOB#: [id number]  cc: Jeneen Rinks P. Holley Bouche., MD, <Dictator> Laurice Record Holley Bouche MD ELECTRONICALLY SIGNED 02/15/2012 19:06

## 2015-04-19 DIAGNOSIS — Z96652 Presence of left artificial knee joint: Secondary | ICD-10-CM | POA: Diagnosis not present

## 2015-04-21 DIAGNOSIS — Z96652 Presence of left artificial knee joint: Secondary | ICD-10-CM | POA: Diagnosis not present

## 2015-04-23 DIAGNOSIS — Z96652 Presence of left artificial knee joint: Secondary | ICD-10-CM | POA: Diagnosis not present

## 2015-04-25 NOTE — Discharge Summary (Signed)
PATIENT NAME:  Gary Mueller, Gary Mueller MR#:  [id number] DATE OF BIRTH:  1941/09/26  DATE OF ADMISSION:  03/29/2015 DATE OF DISCHARGE:  04/01/2015  ADMITTING DIAGNOSIS: Degenerative arthrosis of left knee.   DISCHARGE DIAGNOSIS: Degenerative arthrosis of the right knee.   HISTORY OF PRESENT ILLNESS: The patient is a 74 year old who has been followed at [facility] for progression of left knee pain. He had a long history of progressive left knee pain. He previously underwent a remote left knee arthroscopy with partial meniscectomy. The patient did not seen any significant improvement in his condition despite meloxicam, intra-articular cortisone injections, as well as a series of Synvisc injection. He had localized most of the pain along the medial aspect of the knee. His pain was aggravated with weightbearing activities. At the time of surgery, he was not using any ambulatory aid. The patient was noted to have some activity-related swelling to the left knee. He denied any gross locking or giving way of the knee. The left knee pain had progressed to the point that it was significantly interfering with his activities of daily living. [facility] showed narrowing of the medial cartilage space with bone-on-bone articulation being noted along with the patient being in a varus alignment. Subchondral sclerosis as well as some osteophyte formation was noted. After discussion of the risks, benefits of surgical intervention, the patient expressed his understanding of the risks and benefits and agreed for plans for surgical intervention.   PROCEDURE: Left total knee arthroplasty using computer-assisted navigation.   ANESTHESIA: Spinal.   SOFT TISSUE RELEASES: Anterior cruciate ligament, posterior cruciate ligament, deep and superficial medial collateral ligaments, as well as the patellofemoral ligament.   IMPLANTS UTILIZED:  DePuy PFC Sigma size 4 posterior stabilized femoral component (cemented),  size 5 MBT tibial component (cemented), 38 mm 3 peg oval dome patella (cemented), and a 10 mm stabilized rotating platform polyethylene insert.   HOSPITAL COURSE:  The patient tolerated the procedure very well. He had no complications. He was then taken to the PACU where he was stabilized and then transferred to the orthopedic floor. He began receiving anticoagulation therapy of Lovenox 30 mg subcutaneous every 12 hours per anesthesia and pharmacy protocol. He was fitted with TED stockings bilaterally. These were allowed to be removed 1 hour per 8 hour shift. The left one was applied on day 2 following removal of the Hemovac and dressing change. The patient also was fitted with AVI compression foot pumps bilaterally set at 80 mmHg.  His calves have been nontender. There has been no evidence of any DVTs. Negative Homan sign.   The patient has denied any chest pain or shortness of breath. Vital signs have been stable. He has been afebrile. Hemodynamically, he is stable. No transfusions were given other than the Autovac transfusion given the first 6 hours postoperatively.   Physical therapy was initiated on day 1 for gait training and transfers. Upon being discharged, he was ambulating greater than 250 feet. Was able go up and down 4 sets of steps. He was independent with bed to chair transfers safely. Occupational therapy was also initiated on day 1 for activities of daily living and assistive devices.   The patient's IV, Foley, and Hemovac were discontinued on day 2 along with a dressing change. The wound was free of any drainage or signs of infection. Polar Care was reapplied to the surgical leg maintaining a temperature of 40 to 50 degrees Fahrenheit.   The patient is being discharged to  home in improved stable condition.  He may continue weight-bearing as tolerated. Continue using a rolling walker until cleared by physical therapy to go to a quad cane. He was instructed on elevation of the lower  extremity. He is to continue with TED stockings bilaterally. These may be removed at night, but are to be worn during the daytime. He is encouraged to continue with incentive spirometer every 1 hour while awake and encourage cough, deep breathing every 2 hours while awake. He is placed on a regular diet. Continue the Polar Care and maintain a temperature of 40 to 50 degrees Fahrenheit. I recommend that he wear this around-the-clock for the first 2 weeks. He has a followup appointment with [facility] on April 19 at 9:45. He is to call the clinic sooner for any temperatures of 101.5 or greater or excessive bleeding.   The patient may resume his regular medications that he was on prior to admission. He was given a prescription for oxycodone 5 to 10 mg every 4 to 6 hours p.r.n. for pain, tramadol 50 to 100 mg every 4 to 6 hours p.r.n. for pain and Lovenox 40 mg subcutaneously daily for 14 days, then discontinue and begin taking 181 mg enteric-coated aspirin per day.   PAST MEDICAL HISTORY:  1.  History of coronary artery disease. 2.  PVCs. 3.  Anxiety. 4.  Hypothyroidism.   ____________________________ Vance Peper, PA jrw:sp D: 04/01/2015 07:35:56 ET T: 04/01/2015 12:08:30 ET JOB#: [id number]  cc: Vance Peper, PA, <Dictator> Silverio Hagan PA ELECTRONICALLY SIGNED 04/03/2015 9:50

## 2015-04-25 NOTE — Op Note (Signed)
PATIENT NAME:  Gary Mueller, Gary Mueller MR#:  [id number] DATE OF BIRTH:  1941-10-27  DATE OF PROCEDURE:  03/29/2015  PREOPERATIVE DIAGNOSIS: Degenerative arthrosis of the left knee (primary).   POSTOPERATIVE DIAGNOSIS: Degenerative arthrosis of the left knee (primary).   PROCEDURE PERFORMED: Left total knee arthroplasty using computer-assisted navigation.   SURGEON: Laurice Record. Holley Bouche., MD   ASSISTANT: Vance Peper, PA (required to maintain retraction throughout the procedure).   ANESTHESIA: Spinal.   ESTIMATED BLOOD LOSS: 50 mL.   FLUIDS REPLACED: 1600 mL of crystalloid.  TOURNIQUET TIME: 77 minutes.   DRAINS: Two medium drains to reinfusion system.  SOFT TISSUE RELEASES: Anterior cruciate ligament, posterior cruciate ligament, deep and superficial medial collateral ligament, and patellofemoral ligament.  IMPLANTS UTILIZED: DePuy PFC Sigma size 4 posterior stabilized femoral component (cemented), size 5 MBT tibial component (cemented), 38 mm 3-peg oval dome patella (cemented), and a 10 mm stabilized rotating platform polyethylene insert.  INDICATIONS FOR SURGERY: The patient is a 74 year old male who has been seen for complaints of progressive left knee pain. X-rays demonstrated severe degenerative changes in tricompartmental fascia with relative varus deformity. After discussion of the risks and benefits of surgical intervention, the patient expressed understanding of the risks and benefits, and agreed with plans for surgical intervention.   PROCEDURE IN DETAIL: The patient was brought into the operating room and, after adequate spinal anesthesia was achieved, a tourniquet was placed on the patient's upper left thigh. The patient's left knee and leg were cleaned and prepped with alcohol and DuraPrep and draped in the usual sterile fashion. A "timeout" was performed as per usual protocol. The left lower extremity was exsanguinated using an Esmarch, and the tourniquet was inflated to 300 mmHg. An  anterior longitudinal incision was made followed by a standard mid vastus approach. A moderate effusion was evacuated. The deep fibers of the medial collateral ligament were elevated in a subperiosteal fashion off the medial flare of the tibia so as to maintain a continuous soft tissue sleeve. The patella was subluxed laterally and the patellofemoral ligament was incised. Inspection of the knee demonstrated full-thickness loss of the articular cartilage to the medial compartment. Lesser changes were noted to the patellofemoral and lateral compartments. Prominent osteophytes were debrided using a rongeur. Anterior and posterior cruciate ligaments were excised. Two 4.0 mm Schanz pins were inserted into the femur and into the tibia for attachment of the array of trackers used for computer-assisted navigation. Hip center was identified using circumduction technique. Distal landmarks were mapped using the computer. The distal femur and proximal tibia were mapped using the computer. Distal femoral cutting guide was positioned using computer-assisted navigation so as to achieve a 5-degree distal valgus cut. Cut was performed and verified using the computer. Distal femur was sized and it was felt that a size 4 femoral component was appropriate. A size 4 cutting guide was positioned and anterior cut was performed and verified using the computer. This was followed by completion of the posterior and chamfer cuts. Femoral cutting guide for the central box was then positioned. A central box cut was performed. Attention was then directed to the proximal tibia. Medial and lateral menisci were excised. The extramedullary tibial cutting guide was positioned using computer-assisted navigation so as to achieve a 0-degree varus-valgus alignment and a 0-degree posterior slope. Cut was performed and verified using the computer. The proximal tibia was sized and it was felt that a size 5 tibial tray was appropriate. Tibial and femoral  trials were positioned, followed  by insertion of a 10 mm polyethylene trial. The knee was felt to be tight medially. A Cobb elevator was used to elevate the superficial fibers of the medial collateral ligament. This allowed for excellent medial and lateral soft tissue balancing both in full extension and in flexion. Finally, the patella was cut and prepared so as to accommodate a 38 mm 3-peg oval dome patella. Patellar trial was placed and the knee was placed through a range of motion with excellent patellar tracking appreciated. The femoral trial was removed after debridement of posterior osteophytes. The central post hole for the tibial component was reamed, followed by insertion of a keel punch. Tibial trials were then removed. The cut surfaces of bone were irrigated with copious amounts of normal saline with antibiotic solution using pulsatile lavage and then suctioned dry. Polymethyl methacrylate cement was prepared in the usual fashion using a vacuum mixer. Cement was applied to the cut surface of the proximal tibia as well as along the undersurface of a size 5 MBT tibial component. The tibial component was positioned and impacted into place. Excess cement was removed using freer elevators. Cement was then applied to the cut surface of the femur as well as on the posterior flanges of a size 4 posterior stabilized femoral component. Femoral component was positioned and impacted into place. Excess cement was removed using [employer] fast streamer. A 10 mm polyethylene trial was inserted and the knee was brought into full extension with steady axial compression applied. Finally, the cement was applied to the backside of the 38 mm 3-peg oval dome patella and the patellar component was positioned and patellar clamp applied. Excess cement was removed using [employer] fast streamer.   After adequate curing of cement, the tourniquet was deflated after a total tourniquet time of 77 minutes. Hemostasis was achieved using  electrocautery. The knee was irrigated with copious amounts of normal saline with antibiotic solution using pulsatile lavage and then suctioned dry. The knee was inspected for any residual cement debris. Then, 20 mL of 1.3% Exparel and 40 mL of normal saline was injected along the posterior capsule, medial and lateral gutters, and along the arthrotomy site. A 10 mm stabilized rotating platform polyethylene insert was inserted and the knee was placed through a range of motion with excellent patellar tracking appreciated and excellent medial and lateral soft tissue balancing also noted. Two medium drains were placed in the wound bed and brought out through a separate stab incision to be attached to a reinfusion system. The medial parapatellar portion of the incision was reapproximated using interrupted sutures of #1 Vicryl. The subcutaneous tissue was approximated in layers using first #0 Vicryl followed by #2-0 Vicryl. Then, 30 mL of 0.25% Marcaine with epinephrine was injected into the subcutaneous tissue in line with the skin incision. The skin edges were then reapproximated using skin staples. A sterile dressing was applied.  The patient tolerated the procedure well. He was transported to the recovery room in stable condition.   ____________________________ Laurice Record. Holley Bouche., MD jph:ST D: 03/29/2015 20:45:01 ET T: 03/29/2015 21:56:47 ET JOB#: [id number]  cc: Laurice Record. Holley Bouche., MD, <Dictator> JAMES P Holley Bouche MD ELECTRONICALLY SIGNED 04/03/2015 10:03

## 2015-04-26 DIAGNOSIS — Z96652 Presence of left artificial knee joint: Secondary | ICD-10-CM | POA: Diagnosis not present

## 2015-04-28 DIAGNOSIS — Z96652 Presence of left artificial knee joint: Secondary | ICD-10-CM | POA: Diagnosis not present

## 2015-04-30 DIAGNOSIS — Z96652 Presence of left artificial knee joint: Secondary | ICD-10-CM | POA: Diagnosis not present

## 2015-05-03 DIAGNOSIS — Z96652 Presence of left artificial knee joint: Secondary | ICD-10-CM | POA: Diagnosis not present

## 2015-05-05 DIAGNOSIS — Z96652 Presence of left artificial knee joint: Secondary | ICD-10-CM | POA: Diagnosis not present

## 2015-05-07 DIAGNOSIS — Z96652 Presence of left artificial knee joint: Secondary | ICD-10-CM | POA: Diagnosis not present

## 2015-05-10 DIAGNOSIS — Z96652 Presence of left artificial knee joint: Secondary | ICD-10-CM | POA: Diagnosis not present

## 2015-05-11 DIAGNOSIS — Z96652 Presence of left artificial knee joint: Secondary | ICD-10-CM | POA: Diagnosis not present

## 2015-05-12 DIAGNOSIS — Z96652 Presence of left artificial knee joint: Secondary | ICD-10-CM | POA: Diagnosis not present

## 2015-05-14 DIAGNOSIS — Z96652 Presence of left artificial knee joint: Secondary | ICD-10-CM | POA: Diagnosis not present

## 2015-05-16 DIAGNOSIS — Z96652 Presence of left artificial knee joint: Secondary | ICD-10-CM | POA: Diagnosis not present

## 2015-05-16 DIAGNOSIS — Z471 Aftercare following joint replacement surgery: Secondary | ICD-10-CM | POA: Diagnosis not present

## 2015-05-16 DIAGNOSIS — M1990 Unspecified osteoarthritis, unspecified site: Secondary | ICD-10-CM | POA: Diagnosis not present

## 2015-05-19 DIAGNOSIS — Z96652 Presence of left artificial knee joint: Secondary | ICD-10-CM | POA: Diagnosis not present

## 2015-06-07 ENCOUNTER — Telehealth: Payer: Self-pay | Admitting: Emergency Medicine

## 2015-06-07 DIAGNOSIS — I493 Ventricular premature depolarization: Secondary | ICD-10-CM | POA: Diagnosis not present

## 2015-06-07 DIAGNOSIS — F419 Anxiety disorder, unspecified: Secondary | ICD-10-CM

## 2015-06-07 DIAGNOSIS — I25118 Atherosclerotic heart disease of native coronary artery with other forms of angina pectoris: Secondary | ICD-10-CM | POA: Diagnosis not present

## 2015-06-07 MED ORDER — ALPRAZOLAM 0.5 MG PO TABS: 0.5000 mg | ORAL_TABLET | Freq: Two times a day (BID) | ORAL | Status: DC | PRN

## 2015-06-07 NOTE — Telephone Encounter (Signed)
Pt called and needs a refill on his Xanax. Thanks bb

## 2015-06-07 NOTE — Telephone Encounter (Signed)
Okay to refill for 6 months 

## 2015-06-07 NOTE — Addendum Note (Signed)
Addended by: Althea Charon D on: 06/07/2015 05:13 PM   Modules accepted: Orders

## 2015-06-16 ENCOUNTER — Telehealth: Payer: Self-pay

## 2015-06-16 NOTE — Telephone Encounter (Signed)
Patient c/o having dizziness intermittently for about 1 week. Patient reports that he his symptoms were at its worse today. Patient reports that this morning around 6:30, he felt the sensation of the whole room spinning. He reports that he had 2 other episodes as well. One around lunch time, then again around 4:30pm. Patient denies any chest pain or SOB. He reports that he had a F/U appt with Dr. Josefa Half last week, and had a normal exam. Patient denies any PMH of Hypertension. Denies any dietary changes. He only takes Xanax 1 tablet at bedtime to help him sleep. Dr. Rosanna Randy was advised of patient's symptoms, and recommended that patient start Meclizine 25 mg po TID prn #90 RF 5. Patient is also scheduled for an appt tomorrow (06/17/15) to see Dr. Rosanna Randy.

## 2015-06-17 ENCOUNTER — Encounter: Payer: Self-pay | Admitting: Family Medicine

## 2015-06-17 ENCOUNTER — Ambulatory Visit (INDEPENDENT_AMBULATORY_CARE_PROVIDER_SITE_OTHER): Payer: Medicare Other | Admitting: Family Medicine

## 2015-06-17 VITALS — BP 120/64 | HR 62 | Temp 97.9°F | Resp 16 | Ht 71.0 in | Wt 181.0 lb

## 2015-06-17 DIAGNOSIS — H9190 Unspecified hearing loss, unspecified ear: Secondary | ICD-10-CM | POA: Insufficient documentation

## 2015-06-17 DIAGNOSIS — F419 Anxiety disorder, unspecified: Secondary | ICD-10-CM | POA: Insufficient documentation

## 2015-06-17 DIAGNOSIS — J309 Allergic rhinitis, unspecified: Secondary | ICD-10-CM | POA: Insufficient documentation

## 2015-06-17 DIAGNOSIS — R42 Dizziness and giddiness: Secondary | ICD-10-CM

## 2015-06-17 DIAGNOSIS — E785 Hyperlipidemia, unspecified: Secondary | ICD-10-CM | POA: Insufficient documentation

## 2015-06-17 DIAGNOSIS — E039 Hypothyroidism, unspecified: Secondary | ICD-10-CM | POA: Insufficient documentation

## 2015-06-17 DIAGNOSIS — K279 Peptic ulcer, site unspecified, unspecified as acute or chronic, without hemorrhage or perforation: Secondary | ICD-10-CM | POA: Insufficient documentation

## 2015-06-17 DIAGNOSIS — G4700 Insomnia, unspecified: Secondary | ICD-10-CM | POA: Insufficient documentation

## 2015-06-17 DIAGNOSIS — I493 Ventricular premature depolarization: Secondary | ICD-10-CM | POA: Insufficient documentation

## 2015-06-17 DIAGNOSIS — Z79899 Other long term (current) drug therapy: Secondary | ICD-10-CM | POA: Insufficient documentation

## 2015-06-17 DIAGNOSIS — H8109 Meniere's disease, unspecified ear: Secondary | ICD-10-CM | POA: Insufficient documentation

## 2015-06-17 DIAGNOSIS — B001 Herpesviral vesicular dermatitis: Secondary | ICD-10-CM | POA: Insufficient documentation

## 2015-06-17 DIAGNOSIS — H9319 Tinnitus, unspecified ear: Secondary | ICD-10-CM | POA: Insufficient documentation

## 2015-06-17 DIAGNOSIS — M25569 Pain in unspecified knee: Secondary | ICD-10-CM | POA: Insufficient documentation

## 2015-06-17 DIAGNOSIS — F329 Major depressive disorder, single episode, unspecified: Secondary | ICD-10-CM | POA: Insufficient documentation

## 2015-06-17 DIAGNOSIS — M549 Dorsalgia, unspecified: Secondary | ICD-10-CM | POA: Insufficient documentation

## 2015-06-17 DIAGNOSIS — I2510 Atherosclerotic heart disease of native coronary artery without angina pectoris: Secondary | ICD-10-CM | POA: Insufficient documentation

## 2015-06-17 DIAGNOSIS — I10 Essential (primary) hypertension: Secondary | ICD-10-CM | POA: Insufficient documentation

## 2015-06-17 DIAGNOSIS — R5383 Other fatigue: Secondary | ICD-10-CM | POA: Insufficient documentation

## 2015-06-17 DIAGNOSIS — K589 Irritable bowel syndrome without diarrhea: Secondary | ICD-10-CM | POA: Insufficient documentation

## 2015-06-17 DIAGNOSIS — I4891 Unspecified atrial fibrillation: Secondary | ICD-10-CM | POA: Insufficient documentation

## 2015-06-17 DIAGNOSIS — K219 Gastro-esophageal reflux disease without esophagitis: Secondary | ICD-10-CM | POA: Insufficient documentation

## 2015-06-17 MED ORDER — DIAZEPAM 2 MG PO TABS: 2.0000 mg | ORAL_TABLET | Freq: Four times a day (QID) | ORAL | Status: DC | PRN

## 2015-06-17 NOTE — Progress Notes (Signed)
Subjective:    Patient ID: Gary Mueller, male    DOB: October 12, 1941, 74 y.o.   MRN: [id number]  Dizziness This is a recurrent problem. The current episode started 1 to 4 weeks ago. The problem occurs constantly. The problem has been gradually worsening. Associated symptoms include fatigue, myalgias, nausea, vertigo, a visual change and weakness. Pertinent negatives include no abdominal pain, chest pain, congestion, coughing, fever, headaches, neck pain, numbness or vomiting. The symptoms are aggravated by bending. Treatments tried: Patient reports that he has taken two doses of meclizine 25 mg  The treatment provided no relief.  Patient reports that symptoms started 2 weeks ago on and off. Patient reports that dizziness has been consistent for the last 2 days. Patient reports that quick movement worsens symptoms.    Review of Systems  Constitutional: Positive for fatigue. Negative for fever.  HENT: Negative for congestion.   Eyes: Negative.   Respiratory: Negative.  Negative for cough.   Cardiovascular: Negative for chest pain.  Gastrointestinal: Positive for nausea. Negative for vomiting and abdominal pain.  Endocrine: Negative.   Musculoskeletal: Positive for myalgias. Negative for neck pain.  Neurological: Positive for dizziness, vertigo and weakness. Negative for numbness and headaches.  Psychiatric/Behavioral: Negative.        Grossly nonfocal. No nystagmus noted.    Patient Active Problem List   Diagnosis Date Noted  . Anxiety 06/17/2015  . Allergic rhinitis 06/17/2015  . CAD in native artery 06/17/2015  . A-fib 06/17/2015  . Back ache 06/17/2015  . Gonalgia 06/17/2015  . Clinical depression 06/17/2015  . Dizziness 06/17/2015  . Polypharmacy 06/17/2015  . Fatigue 06/17/2015  . Cold sore 06/17/2015  . Difficulty hearing 06/17/2015  . HLD (hyperlipidemia) 06/17/2015  . BP (high blood pressure) 06/17/2015  . Adult hypothyroidism 06/17/2015  . Adaptive colitis 06/17/2015   . Cannot sleep 06/17/2015  . Meniere's disease 06/17/2015  . Gastroduodenal ulcer 06/17/2015  . Gastro-esophageal reflux disease without esophagitis 06/17/2015  . Buzzing in ear 06/17/2015  . Beat, premature ventricular 06/17/2015  . H/O total knee replacement 04/13/2015   Past Medical History  Diagnosis Date  . Allergy   . Anxiety   . Depression   . GERD (gastroesophageal reflux disease)   . Hyperlipidemia   . Hypertension   . Thyroid disease    Current Outpatient Prescriptions on File Prior to Visit  Medication Sig  . ALPRAZolam (XANAX) 0.5 MG tablet Take 1 tablet (0.5 mg total) by mouth 2 (two) times daily as needed for anxiety.   No current facility-administered medications on file prior to visit.   Allergies  Allergen Reactions  . Codeine     GI intolerance   Past Surgical History  Procedure Laterality Date  . Cholecystectomy  1968  . Hernia repair Right     inguinal hernia repair  . Total knee arthroplasty Left 03/29/2015    [facility] Dr. Marry Guan   History   Social History  . Marital Status: Married    Spouse Name: Lelon Frohlich  . Number of Children: 3  . Years of Education: N/A   Occupational History  . full time    Social History Main Topics  . Smoking status: Former Smoker    Types: Cigarettes    Quit date: 12/24/1964  . Smokeless tobacco: Never Used  . Alcohol Use: No  . Drug Use: No  . Sexual Activity: Not on file   Other Topics Concern  . Not on file   Social History Narrative  Family History  Problem Relation Age of Onset  . Heart disease Mother     A fib  . Healthy Sister        Objective:   Physical Exam  Constitutional: He is oriented to person, place, and time. He appears well-developed and well-nourished.  Eyes: Conjunctivae and EOM are normal. Pupils are equal, round, and reactive to light.  Cardiovascular: Normal rate and regular rhythm.   Pulmonary/Chest: Effort normal and breath sounds normal.  Neurological: He is alert and oriented  to person, place, and time. No cranial nerve deficit.   Blood pressure 120/64, pulse 62, temperature 97.9 F (36.6 C), temperature source Oral, resp. rate 16, height 5\' 11"  (1.803 m), weight 181 lb (82.101 kg), SpO2 96 %.       Assessment & Plan:   1. Dizziness Worsening. Patient started on Diazepam 2 mg as below. Patient advised to stay well hydrated. Patient advised to call if symptoms are not improving or worsening. Patient aware that he may need referral to ENT. - diazepam (VALIUM) 2 MG tablet; Take 1 tablet (2 mg total) by mouth every 6 (six) hours as needed for anxiety.  Dispense: 120 tablet; Refill: 5

## 2015-06-29 DIAGNOSIS — H8110 Benign paroxysmal vertigo, unspecified ear: Secondary | ICD-10-CM | POA: Diagnosis not present

## 2015-06-29 DIAGNOSIS — H8309 Labyrinthitis, unspecified ear: Secondary | ICD-10-CM | POA: Diagnosis not present

## 2015-06-29 DIAGNOSIS — H903 Sensorineural hearing loss, bilateral: Secondary | ICD-10-CM | POA: Diagnosis not present

## 2015-07-06 DIAGNOSIS — R42 Dizziness and giddiness: Secondary | ICD-10-CM | POA: Diagnosis not present

## 2015-07-19 DIAGNOSIS — H903 Sensorineural hearing loss, bilateral: Secondary | ICD-10-CM | POA: Diagnosis not present

## 2015-07-19 DIAGNOSIS — R42 Dizziness and giddiness: Secondary | ICD-10-CM | POA: Diagnosis not present

## 2015-07-20 ENCOUNTER — Other Ambulatory Visit: Payer: Self-pay | Admitting: Family Medicine

## 2015-08-02 LAB — CBC
HCT: 41.4 % (ref 40.0–52.0)
HGB: 13.6 g/dL (ref 13.0–18.0)
MCH: 31 pg (ref 26.0–34.0)
MCHC: 33 g/dL (ref 32.0–36.0)
MCV: 84 fL (ref 80–100)
PLATELETS: 173 10*3/uL (ref 150–440)
RBC: 4.39 10*6/uL — ABNORMAL LOW (ref 4.40–5.90)
RDW: 12.9 % (ref 11.5–14.5)
WBC: 6.3 10*3/uL (ref 3.8–10.6)

## 2015-08-02 LAB — URINALYSIS, COMPLETE
BACTERIA: NONE SEEN
BILIRUBIN, UR: NEGATIVE
Blood: NEGATIVE
Glucose,UR: NEGATIVE mg/dL (ref 0–75)
Leukocyte Esterase: NEGATIVE
NITRITE: NEGATIVE
PH: 6 (ref 4.5–8.0)
Protein: NEGATIVE
Specific Gravity: 1.016 (ref 1.003–1.030)
Squamous Epithelial: NONE SEEN
WBC UR: NONE SEEN /HPF (ref 0–5)

## 2015-08-02 LAB — BASIC METABOLIC PANEL
ANION GAP: 7 (ref 7–16)
BUN: 30 mg/dL — ABNORMAL HIGH
CALCIUM: 9.3 mg/dL
CREATININE: 0.84 mg/dL
Chloride: 104 mmol/L
Co2: 29 mmol/L
EGFR (African American): >60
GLUCOSE: 101 mg/dL — AB
Potassium: 4.3 mmol/L
SODIUM: 140 mmol/L

## 2015-08-02 LAB — APTT: Activated PTT: 31.1 secs (ref 23.6–35.9)

## 2015-08-02 LAB — PROTIME-INR
INR: 1
PROTHROMBIN TIME: 13.3 s

## 2015-08-02 LAB — SEDIMENTATION RATE: Erythrocyte Sed Rate: 1 mm/hr (ref 0–20)

## 2015-09-29 DIAGNOSIS — Z23 Encounter for immunization: Secondary | ICD-10-CM | POA: Diagnosis not present

## 2015-12-07 DIAGNOSIS — I25118 Atherosclerotic heart disease of native coronary artery with other forms of angina pectoris: Secondary | ICD-10-CM | POA: Diagnosis not present

## 2015-12-07 DIAGNOSIS — I493 Ventricular premature depolarization: Secondary | ICD-10-CM | POA: Diagnosis not present

## 2016-03-08 ENCOUNTER — Ambulatory Visit (INDEPENDENT_AMBULATORY_CARE_PROVIDER_SITE_OTHER): Payer: Medicare Other | Admitting: Family Medicine

## 2016-03-08 ENCOUNTER — Encounter: Payer: Self-pay | Admitting: Family Medicine

## 2016-03-08 VITALS — BP 130/72 | Temp 97.8°F | Resp 16 | Wt 184.0 lb

## 2016-03-08 DIAGNOSIS — J329 Chronic sinusitis, unspecified: Secondary | ICD-10-CM

## 2016-03-08 MED ORDER — AMOXICILLIN-POT CLAVULANATE 875-125 MG PO TABS: 1.0000 | ORAL_TABLET | Freq: Two times a day (BID) | ORAL | Status: DC

## 2016-03-08 NOTE — Progress Notes (Signed)
Patient ID: Gary Mueller, male   DOB: 07-28-1941, 75 y.o.   MRN: [id number]       Patient: Gary Mueller Male    DOB: Dec 24, 1941   75 y.o.   MRN: [id number] Visit Date: 03/08/2016  Today's Provider: Wilhemena Durie, MD   Chief Complaint  Patient presents with  . URI    X 3 days.    Subjective:    URI  This is a new problem. The current episode started in the past 7 days. The problem has been gradually worsening. The maximum temperature recorded prior to his arrival was 100.4 - 100.9 F. The fever has been present for less than 1 day. Associated symptoms include congestion, coughing, ear pain, a plugged ear sensation, rhinorrhea, sinus pain, sneezing and a sore throat. He has tried decongestant for the symptoms. The treatment provided mild relief.  Patient reports that he has had symptoms for the last 3 days. He reports that he has a lot of PND along with sore throat. He denies any shortness of breath or wheezing. However, he does have a productive cough.       Allergies  Allergen Reactions  . Codeine     GI intolerance   Previous Medications   ACYCLOVIR (ZOVIRAX) 400 MG TABLET    Take by mouth.   ALPRAZOLAM (XANAX) 0.5 MG TABLET    Take 1 tablet (0.5 mg total) by mouth 2 (two) times daily as needed for anxiety.   ASCORBIC ACID (VITAMIN C) 1000 MG TABLET    Take by mouth.   ASPIRIN 81 MG TABLET    Take by mouth.   DIAZEPAM (VALIUM) 2 MG TABLET    Take 1 tablet (2 mg total) by mouth every 6 (six) hours as needed for anxiety.   LEVOTHYROXINE (SYNTHROID, LEVOTHROID) 50 MCG TABLET    TAKE ONE TABLET BY MOUTH ONCE DAILY   MECLIZINE (ANTIVERT) 25 MG TABLET    Take 25 mg by mouth 3 (three) times daily as needed for dizziness.   MELOXICAM (MOBIC) 7.5 MG TABLET    Take by mouth. Reported on 03/08/2016   MULTIPLE VITAMIN PO    Take by mouth.   RANITIDINE (ZANTAC) 150 MG TABLET    Take by mouth.    Review of Systems  Constitutional: Positive for fatigue.  HENT: Positive for  congestion, ear pain, postnasal drip, rhinorrhea, sinus pressure, sneezing and sore throat.   Respiratory: Positive for cough.   Cardiovascular: Negative.     Social History  Substance Use Topics  . Smoking status: Former Smoker    Types: Cigarettes    Quit date: 12/24/1964  . Smokeless tobacco: Never Used  . Alcohol Use: No   Objective:   BP 130/72 mmHg  Temp(Src) 97.8 F (36.6 C)  Resp 16  Wt 184 lb (83.462 kg)  Physical Exam  Constitutional: He is oriented to person, place, and time. He appears well-developed and well-nourished.  HENT:  Head: Normocephalic and atraumatic.  Right Ear: External ear normal.  Left Ear: External ear normal.  Nose: Nose normal.  Mouth/Throat: No oropharyngeal exudate.  Eyes: Conjunctivae are normal.  Neck: Neck supple. No thyromegaly present.  Cardiovascular: Normal rate, regular rhythm and normal heart sounds.   Pulmonary/Chest: Effort normal and breath sounds normal.  Abdominal: Soft.  Lymphadenopathy:    He has no cervical adenopathy.  Neurological: He is alert and oriented to person, place, and time.  Skin: Skin is dry.  Psychiatric: He has a normal mood  and affect. His behavior is normal. Judgment and thought content normal.        Assessment & Plan:     1. Sinusitis, unspecified chronicity, unspecified location  - amoxicillin-clavulanate (AUGMENTIN) 875-125 MG tablet; Take 1 tablet by mouth 2 (two) times daily.  Dispense: 20 tablet; Refill: 0 I have done the exam and reviewed the above chart and it is accurate to the best of my knowledge.       Richard Cranford Mon, MD  [facility]

## 2016-03-20 ENCOUNTER — Other Ambulatory Visit: Payer: Self-pay | Admitting: Family Medicine

## 2016-03-20 DIAGNOSIS — J329 Chronic sinusitis, unspecified: Secondary | ICD-10-CM

## 2016-03-20 MED ORDER — AMOXICILLIN-POT CLAVULANATE 875-125 MG PO TABS: 1.0000 | ORAL_TABLET | Freq: Two times a day (BID) | ORAL | Status: DC

## 2016-03-20 NOTE — Telephone Encounter (Signed)
Medication sent into the pharmacy. Patient advised.

## 2016-03-20 NOTE — Telephone Encounter (Signed)
lov was 03/08/16 for sinusitis. Will you refill or need to come in?-aa

## 2016-03-20 NOTE — Telephone Encounter (Signed)
Pt contacted office for refill request on the following medications:  amoxicillin-clavulanate (AUGMENTIN) 875-125 MG tablet.  [place]  CB#[phone]/MW

## 2016-03-20 NOTE — Telephone Encounter (Signed)
Ok--BID for 1 week.

## 2016-03-27 ENCOUNTER — Telehealth: Payer: Self-pay | Admitting: Family Medicine

## 2016-03-27 NOTE — Telephone Encounter (Signed)
Please review-aa 

## 2016-03-27 NOTE — Telephone Encounter (Signed)
Ok thx.

## 2016-03-27 NOTE — Telephone Encounter (Signed)
Pt was in about two weeks ago for a cough, sinus infection.  He still has the lingering cough.  He wants to know if you could call him in the tussinex.  He has finished the antibiotic.  Sinus infection is better.  He uses [facility] on [address]  His call back is [phone]

## 2016-03-28 MED ORDER — HYDROCOD POLST-CPM POLST ER 10-8 MG/5ML PO SUER: 5.0000 mL | Freq: Two times a day (BID) | ORAL | Status: DC | PRN

## 2016-03-28 NOTE — Telephone Encounter (Signed)
Pt advised, we have allergy to codeine for patient but he states that he did fine with this medication before, advised patient he will have to pick up RX to take to the pharmacy-aa

## 2016-03-28 NOTE — Telephone Encounter (Signed)
Yes--172ml--thx

## 2016-03-28 NOTE — Telephone Encounter (Signed)
Does this mean to print him a Rx for Tussinex?

## 2016-03-30 DIAGNOSIS — Z96652 Presence of left artificial knee joint: Secondary | ICD-10-CM | POA: Diagnosis not present

## 2016-04-12 ENCOUNTER — Encounter: Payer: Self-pay | Admitting: Family Medicine

## 2016-04-12 ENCOUNTER — Ambulatory Visit (INDEPENDENT_AMBULATORY_CARE_PROVIDER_SITE_OTHER): Payer: Medicare Other | Admitting: Family Medicine

## 2016-04-12 VITALS — BP 128/60 | HR 74 | Temp 97.7°F | Resp 14 | Wt 189.0 lb

## 2016-04-12 DIAGNOSIS — K279 Peptic ulcer, site unspecified, unspecified as acute or chronic, without hemorrhage or perforation: Secondary | ICD-10-CM

## 2016-04-12 DIAGNOSIS — K2970 Gastritis, unspecified, without bleeding: Secondary | ICD-10-CM

## 2016-04-12 MED ORDER — OMEPRAZOLE 20 MG PO CPDR: 20.0000 mg | DELAYED_RELEASE_CAPSULE | Freq: Every day | ORAL | Status: DC

## 2016-04-12 NOTE — Progress Notes (Signed)
Patient ID: Gary Mueller, male   DOB: [birth year], 75 y.o.   MRN: [id number]    Subjective:  HPI Pt is here today for abdominal pain. It has been occuring since about the fall of this past year. He reports that he spoke with Dr. Rosanna Randy about it and the PA student that was following him at the time. I do not see anything about this in the chart. He reports that shortly after this visit the pain went away, then came back and he ignored it for a while. He reports that it is a difficult pain to describe, it is an inconsistent pain that is made worse by certain foods but is worse if he does not eat. It is located in the center of his upper stomach. He reports that the muscle in this area seem to be tender. Denies nausea, diarrhea, or heartburn. He has not taken anything for his stomach in a while.   Prior to Admission medications   Medication Sig Start Date End Date Taking? Authorizing Provider  ALPRAZolam Duanne Moron) 0.5 MG tablet Take 1 tablet (0.5 mg total) by mouth 2 (two) times daily as needed for anxiety. 06/07/15  Yes Richard Maceo Pro., MD  Ascorbic Acid (VITAMIN C) 1000 MG tablet Take by mouth.   Yes Historical Provider, MD  aspirin 81 MG tablet Take by mouth.   Yes Historical Provider, MD  cholecalciferol (VITAMIN D) 1000 units tablet Take 1,000 Units by mouth 2 (two) times daily.   Yes Historical Provider, MD  levothyroxine (SYNTHROID, LEVOTHROID) 50 MCG tablet TAKE ONE TABLET BY MOUTH ONCE DAILY 07/20/15  Yes Jerrol Banana., MD  MULTIPLE VITAMIN PO Take by mouth. 11/29/11  Yes Historical Provider, MD  acyclovir (ZOVIRAX) 400 MG tablet Take by mouth. Reported on 04/12/2016 03/22/15   Historical Provider, MD  ranitidine (ZANTAC) 150 MG tablet Take by mouth. Reported on 04/12/2016 02/24/14   Historical Provider, MD    Patient Active Problem List   Diagnosis Date Noted  . Anxiety 06/17/2015  . Allergic rhinitis 06/17/2015  . CAD in native artery 06/17/2015  . A-fib ([place]) 06/17/2015  .  Back ache 06/17/2015  . Gonalgia 06/17/2015  . Clinical depression 06/17/2015  . Dizziness 06/17/2015  . Polypharmacy 06/17/2015  . Fatigue 06/17/2015  . Cold sore 06/17/2015  . Difficulty hearing 06/17/2015  . HLD (hyperlipidemia) 06/17/2015  . BP (high blood pressure) 06/17/2015  . Adult hypothyroidism 06/17/2015  . Adaptive colitis 06/17/2015  . Cannot sleep 06/17/2015  . Meniere's disease 06/17/2015  . Gastroduodenal ulcer 06/17/2015  . Gastro-esophageal reflux disease without esophagitis 06/17/2015  . Buzzing in ear 06/17/2015  . Beat, premature ventricular 06/17/2015  . H/O total knee replacement 04/13/2015    Past Medical History  Diagnosis Date  . Allergy   . Anxiety   . Depression   . GERD (gastroesophageal reflux disease)   . Hyperlipidemia   . Hypertension   . Thyroid disease     Social History   Social History  . Marital Status: Married    Spouse Name: Lelon Frohlich  . Number of Children: 3  . Years of Education: N/A   Occupational History  . full time    Social History Main Topics  . Smoking status: Former Smoker    Types: Cigarettes    Quit date: 12/24/1964  . Smokeless tobacco: Never Used  . Alcohol Use: No  . Drug Use: No  . Sexual Activity: Not on file   Other Topics Concern  .  Not on file   Social History Narrative    Allergies  Allergen Reactions  . Codeine     GI intolerance    Review of Systems  Constitutional: Negative.   HENT: Negative.   Eyes: Negative.   Respiratory: Negative.   Cardiovascular: Negative.   Gastrointestinal: Positive for abdominal pain.  Genitourinary: Negative.   Musculoskeletal: Negative.   Skin: Negative.   Neurological: Negative.   Endo/Heme/Allergies: Negative.   Psychiatric/Behavioral: Negative.     Immunization History  Administered Date(s) Administered  . Influenza-Unspecified 09/24/2013  . Pneumococcal Polysaccharide-23 08/10/1998  . Zoster 12/12/2007   Objective:  BP 128/60 mmHg  Pulse 74   Temp(Src) 97.7 F (36.5 C) (Oral)  Resp 14  Wt 189 lb (85.73 kg)  Physical Exam  Constitutional: He is oriented to person, place, and time and well-developed, well-nourished, and in no distress.  HENT:  Head: Normocephalic and atraumatic.  Eyes: Conjunctivae and EOM are normal. Pupils are equal, round, and reactive to light.  Neck: Normal range of motion. Neck supple.  Cardiovascular: Normal rate, regular rhythm, normal heart sounds and intact distal pulses.   Pulmonary/Chest: Effort normal and breath sounds normal.  Abdominal: Soft. Bowel sounds are normal.  Musculoskeletal: Normal range of motion.  Neurological: He is alert and oriented to person, place, and time. He has normal reflexes. Gait normal. GCS score is 15.  Skin: Skin is warm and dry.  Psychiatric: Mood, memory, affect and judgment normal.    Lab Results  Component Value Date   WBC 6.3 03/17/2015   HGB 12.2* 03/31/2015   HCT 41.4 03/17/2015   PLT 147* 03/31/2015   GLUCOSE 100* 03/31/2015   INR 1.0 03/17/2015    CMP     Component Value Date/Time   NA 137 03/31/2015 0624   NA 142 01/18/2015   K 3.6 03/31/2015 0624   K 4.3 01/18/2015   CL 102 03/31/2015 0624   CO2 29 03/31/2015 0624   GLUCOSE 100* 03/31/2015 0624   BUN 14 03/31/2015 0624   BUN 23* 01/18/2015   CREATININE 0.79 03/31/2015 0624   CREATININE 0.9 01/18/2015   CALCIUM 8.4* 03/31/2015 0624   AST 16 01/18/2015   ALT 13 01/18/2015   GFRNONAA >60 03/31/2015 0624   GFRNONAA >60 02/07/2012 1043   GFRAA >60 03/31/2015 0624   GFRAA >60 02/07/2012 1043    Assessment and Plan :  1. Gastritis Omeprazole in the am and Zantac in the pm recheck in 2 months.  - omeprazole (PRILOSEC) 20 MG capsule; Take 1 capsule (20 mg total) by mouth daily.  Dispense: 30 capsule; Refill: 12 No alarm symptoms. 2. Peptic ulcer--possible-- May need GI referral. - omeprazole (PRILOSEC) 20 MG capsule; Take 1 capsule (20 mg total) by mouth daily.  Dispense: 30 capsule;  Refill: 12  I have done the exam and reviewed the above chart and it is accurate to the best of my knowledge.  Patient was seen and examined by Dr. Miguel Aschoff, and noted scribed by Webb Laws, [place] MD [facility] 04/12/2016 3:41 PM

## 2016-04-25 DIAGNOSIS — M19071 Primary osteoarthritis, right ankle and foot: Secondary | ICD-10-CM | POA: Diagnosis not present

## 2016-04-25 DIAGNOSIS — M79671 Pain in right foot: Secondary | ICD-10-CM | POA: Diagnosis not present

## 2016-06-08 DIAGNOSIS — M79641 Pain in right hand: Secondary | ICD-10-CM | POA: Diagnosis not present

## 2016-06-08 DIAGNOSIS — M79671 Pain in right foot: Secondary | ICD-10-CM | POA: Diagnosis not present

## 2016-06-08 DIAGNOSIS — M150 Primary generalized (osteo)arthritis: Secondary | ICD-10-CM | POA: Diagnosis not present

## 2016-06-08 DIAGNOSIS — G8929 Other chronic pain: Secondary | ICD-10-CM | POA: Diagnosis not present

## 2016-06-08 DIAGNOSIS — M79642 Pain in left hand: Secondary | ICD-10-CM | POA: Diagnosis not present

## 2016-06-12 ENCOUNTER — Ambulatory Visit (INDEPENDENT_AMBULATORY_CARE_PROVIDER_SITE_OTHER): Payer: Medicare Other | Admitting: Family Medicine

## 2016-06-12 VITALS — BP 134/62 | HR 60 | Temp 97.9°F | Resp 16 | Wt 187.0 lb

## 2016-06-12 DIAGNOSIS — I10 Essential (primary) hypertension: Secondary | ICD-10-CM

## 2016-06-12 DIAGNOSIS — E039 Hypothyroidism, unspecified: Secondary | ICD-10-CM

## 2016-06-12 DIAGNOSIS — E7800 Pure hypercholesterolemia, unspecified: Secondary | ICD-10-CM

## 2016-06-12 DIAGNOSIS — K219 Gastro-esophageal reflux disease without esophagitis: Secondary | ICD-10-CM

## 2016-06-12 DIAGNOSIS — R5383 Other fatigue: Secondary | ICD-10-CM | POA: Diagnosis not present

## 2016-06-12 NOTE — Progress Notes (Signed)
Gary Mueller  MRN: [id number] DOB: 09-09-1941  Subjective:  HPI   The patient is a 75 year old male who presents for follow up of his acid reflux.  His last visit was on 04/12/16.  At that time he was instructed to take Omeprazole 20 mg every mornoing and Ranitidine 150 mg every evening.  He states that this has helped and feels that his symptoms are well controlled with the mediciation. He has a daily stress of a wife with cancer and sons in poor health for whom he is responsible for her day-to-day welfare.  Patient Active Problem List   Diagnosis Date Noted  . Anxiety 06/17/2015  . Allergic rhinitis 06/17/2015  . CAD in native artery 06/17/2015  . A-fib ([place]) 06/17/2015  . Back ache 06/17/2015  . Gonalgia 06/17/2015  . Clinical depression 06/17/2015  . Dizziness 06/17/2015  . Polypharmacy 06/17/2015  . Fatigue 06/17/2015  . Cold sore 06/17/2015  . Difficulty hearing 06/17/2015  . HLD (hyperlipidemia) 06/17/2015  . BP (high blood pressure) 06/17/2015  . Adult hypothyroidism 06/17/2015  . Adaptive colitis 06/17/2015  . Cannot sleep 06/17/2015  . Meniere's disease 06/17/2015  . Gastroduodenal ulcer 06/17/2015  . Gastro-esophageal reflux disease without esophagitis 06/17/2015  . Buzzing in ear 06/17/2015  . Beat, premature ventricular 06/17/2015  . H/O total knee replacement 04/13/2015    Past Medical History  Diagnosis Date  . Allergy   . Anxiety   . Depression   . GERD (gastroesophageal reflux disease)   . Hyperlipidemia   . Hypertension   . Thyroid disease     Social History   Social History  . Marital Status: Married    Spouse Name: Lelon Frohlich  . Number of Children: 3  . Years of Education: N/A   Occupational History  . full time    Social History Main Topics  . Smoking status: Former Smoker    Types: Cigarettes    Quit date: 12/24/1964  . Smokeless tobacco: Never Used  . Alcohol Use: No  . Drug Use: No  . Sexual Activity: Not on file   Other Topics  Concern  . Not on file   Social History Narrative    Outpatient Prescriptions Prior to Visit  Medication Sig Dispense Refill  . acyclovir (ZOVIRAX) 400 MG tablet Take by mouth. Reported on 04/12/2016    . ALPRAZolam (XANAX) 0.5 MG tablet Take 1 tablet (0.5 mg total) by mouth 2 (two) times daily as needed for anxiety. 60 tablet 5  . Ascorbic Acid (VITAMIN C) 1000 MG tablet Take by mouth.    Marland Kitchen aspirin 81 MG tablet Take by mouth.    . cholecalciferol (VITAMIN D) 1000 units tablet Take 1,000 Units by mouth 2 (two) times daily.    Marland Kitchen levothyroxine (SYNTHROID, LEVOTHROID) 50 MCG tablet TAKE ONE TABLET BY MOUTH ONCE DAILY 90 tablet 3  . MULTIPLE VITAMIN PO Take by mouth.    Marland Kitchen omeprazole (PRILOSEC) 20 MG capsule Take 1 capsule (20 mg total) by mouth daily. 30 capsule 12  . ranitidine (ZANTAC) 150 MG tablet Take by mouth. Reported on 04/12/2016     No facility-administered medications prior to visit.    Allergies  Allergen Reactions  . Codeine     GI intolerance    Review of Systems  Constitutional: Negative for fever and malaise/fatigue.  Respiratory: Negative for cough, shortness of breath and wheezing.   Cardiovascular: Negative for chest pain, orthopnea, claudication and leg swelling. Palpitations: Not new and no  changes.  Gastrointestinal: Negative for heartburn, nausea, vomiting, abdominal pain, diarrhea, constipation, blood in stool and melena.  Neurological: Negative for dizziness, weakness and headaches.   Objective:  BP 134/62 mmHg  Pulse 60  Temp(Src) 97.9 F (36.6 C) (Oral)  Resp 16  Wt 187 lb (84.823 kg)  Physical Exam  Constitutional: He is oriented to person, place, and time and well-developed, well-nourished, and in no distress.  HENT:  Head: Normocephalic.  Eyes: Pupils are equal, round, and reactive to light.  Neck: Normal range of motion.  Cardiovascular: Normal rate, regular rhythm and normal heart sounds.   Pulmonary/Chest: Effort normal and breath sounds  normal.  Abdominal: Soft. Bowel sounds are normal.  Neurological: He is alert and oriented to person, place, and time. Gait normal.  Skin: Skin is warm and dry.  Psychiatric: Mood, memory, affect and judgment normal.    Assessment and Plan :   1. Gastroesophageal reflux disease, esophagitis presence not specified Continue presents medications for at least 6 months.    2. Other fatigue  - CBC with Differential/Platelet - Comprehensive metabolic panel - TSH  3. Hypothyroidism, unspecified hypothyroidism type  - TSH  4. Essential hypertension  - CBC with Differential/Platelet - Comprehensive metabolic panel  5. Hypercholesteremia  - Lipid Panel With LDL/HDL Ratio   Patient was seen and examined by Dr. Delfino Lovett L. Cranford Mon and the note was scribed by Althea Charon, RMA.   Miguel Aschoff MD [facility] 06/12/2016 3:29 PM

## 2016-06-13 DIAGNOSIS — I493 Ventricular premature depolarization: Secondary | ICD-10-CM | POA: Diagnosis not present

## 2016-06-13 DIAGNOSIS — I2510 Atherosclerotic heart disease of native coronary artery without angina pectoris: Secondary | ICD-10-CM | POA: Diagnosis not present

## 2016-07-19 ENCOUNTER — Encounter: Payer: Self-pay | Admitting: Family Medicine

## 2016-07-19 ENCOUNTER — Ambulatory Visit (INDEPENDENT_AMBULATORY_CARE_PROVIDER_SITE_OTHER): Payer: Medicare Other | Admitting: Family Medicine

## 2016-07-19 VITALS — BP 116/62 | HR 72 | Temp 98.5°F | Resp 14 | Wt 186.0 lb

## 2016-07-19 DIAGNOSIS — R1084 Generalized abdominal pain: Secondary | ICD-10-CM

## 2016-07-19 DIAGNOSIS — R5383 Other fatigue: Secondary | ICD-10-CM | POA: Diagnosis not present

## 2016-07-19 DIAGNOSIS — R159 Full incontinence of feces: Secondary | ICD-10-CM

## 2016-07-19 DIAGNOSIS — K219 Gastro-esophageal reflux disease without esophagitis: Secondary | ICD-10-CM | POA: Diagnosis not present

## 2016-07-19 NOTE — Progress Notes (Signed)
Subjective:  HPI  Patient is here to discuss abdominal pain.  Patient states his symptoms currently are just like when he came in in April for this except bowel incontinence was not as bad at that time. He was then started on Omeprazole and Zantac and on follow up in June he was feeling better. ABout 2 to 3 weeks ago the pain has come back-pain located in the middle of the abdomen, rumbling sensation is present, he has had issues with bowel incontinence also. He states some days he has a bowel movement 5 to 6 times a day but then can also go 2 to 3 days and not have a bowel movement. His appetite has not really changed. He has not had any nausea or vomiting. He does feel fatigue.  Prior to Admission medications   Medication Sig Start Date End Date Taking? Authorizing Provider  ALPRAZolam Duanne Moron) 0.5 MG tablet Take 1 tablet (0.5 mg total) by mouth 2 (two) times daily as needed for anxiety. 06/07/15  Yes Richard Maceo Pro., MD  Ascorbic Acid (VITAMIN C) 1000 MG tablet Take by mouth.   Yes Historical Provider, MD  aspirin 81 MG tablet Take by mouth.   Yes Historical Provider, MD  cholecalciferol (VITAMIN D) 1000 units tablet Take 1,000 Units by mouth 2 (two) times daily.   Yes Historical Provider, MD  levothyroxine (SYNTHROID, LEVOTHROID) 50 MCG tablet TAKE ONE TABLET BY MOUTH ONCE DAILY 07/20/15  Yes Jerrol Banana., MD  MULTIPLE VITAMIN PO Take by mouth. 11/29/11  Yes Historical Provider, MD  omeprazole (PRILOSEC) 20 MG capsule Take 1 capsule (20 mg total) by mouth daily. 04/12/16  Yes Richard Maceo Pro., MD  ranitidine (ZANTAC) 150 MG tablet Take by mouth. Reported on 04/12/2016 02/24/14  Yes Historical Provider, MD  acyclovir (ZOVIRAX) 400 MG tablet Take by mouth. Reported on 04/12/2016 03/22/15   Historical Provider, MD    Patient Active Problem List   Diagnosis Date Noted  . Anxiety 06/17/2015  . Allergic rhinitis 06/17/2015  . CAD in native artery 06/17/2015  . A-fib ([place])  06/17/2015  . Back ache 06/17/2015  . Gonalgia 06/17/2015  . Clinical depression 06/17/2015  . Dizziness 06/17/2015  . Polypharmacy 06/17/2015  . Fatigue 06/17/2015  . Cold sore 06/17/2015  . Difficulty hearing 06/17/2015  . HLD (hyperlipidemia) 06/17/2015  . BP (high blood pressure) 06/17/2015  . Adult hypothyroidism 06/17/2015  . Adaptive colitis 06/17/2015  . Cannot sleep 06/17/2015  . Meniere's disease 06/17/2015  . Gastroduodenal ulcer 06/17/2015  . Gastro-esophageal reflux disease without esophagitis 06/17/2015  . Buzzing in ear 06/17/2015  . Beat, premature ventricular 06/17/2015  . H/O total knee replacement 04/13/2015    Past Medical History:  Diagnosis Date  . Allergy   . Anxiety   . Depression   . GERD (gastroesophageal reflux disease)   . Hyperlipidemia   . Hypertension   . Thyroid disease     Social History   Social History  . Marital status: Married    Spouse name: Lelon Frohlich  . Number of children: 3  . Years of education: N/A   Occupational History  . full time    Social History Main Topics  . Smoking status: Former Smoker    Types: Cigarettes    Quit date: 12/24/1964  . Smokeless tobacco: Never Used  . Alcohol use No  . Drug use: No  . Sexual activity: Not on file   Other Topics Concern  . Not on  file   Social History Narrative  . No narrative on file    Allergies  Allergen Reactions  . Codeine     GI intolerance    Review of Systems  Constitutional: Positive for malaise/fatigue.  Respiratory: Negative.   Cardiovascular: Negative.   Gastrointestinal: Positive for abdominal pain.       Bowel incontinence.  Neurological: Positive for weakness.    Immunization History  Administered Date(s) Administered  . Influenza-Unspecified 09/24/2013  . Pneumococcal Polysaccharide-23 08/10/1998  . Zoster 12/12/2007   Objective:  BP 116/62   Pulse 72   Temp 98.5 F (36.9 C)   Resp 14   Wt 186 lb (84.4 kg)   BMI 25.94 kg/m   Physical  Exam  Constitutional: He is oriented to person, place, and time and well-developed, well-nourished, and in no distress.  HENT:  Head: Normocephalic and atraumatic.  Right Ear: External ear normal.  Left Ear: External ear normal.  Eyes: Conjunctivae are normal. Pupils are equal, round, and reactive to light.  Neck: Normal range of motion. Neck supple.  Cardiovascular: Normal rate, regular rhythm, normal heart sounds and intact distal pulses.   No murmur heard. Pulmonary/Chest: Effort normal and breath sounds normal. No respiratory distress. He has no wheezes.  Abdominal: There is tenderness (worse in the mid epigastric).  Musculoskeletal: Normal range of motion. He exhibits no edema.  Neurological: He is alert and oriented to person, place, and time.  Psychiatric: Mood, memory, affect and judgment normal.    Lab Results  Component Value Date   WBC 6.3 03/17/2015   HGB 12.2 (L) 03/31/2015   HCT 41.4 03/17/2015   PLT 147 (L) 03/31/2015   GLUCOSE 100 (H) 03/31/2015   INR 1.0 03/17/2015    CMP     Component Value Date/Time   NA 137 03/31/2015 0624   K 3.6 03/31/2015 0624   CL 102 03/31/2015 0624   CO2 29 03/31/2015 0624   GLUCOSE 100 (H) 03/31/2015 0624   BUN 14 03/31/2015 0624   CREATININE 0.79 03/31/2015 0624   CALCIUM 8.4 (L) 03/31/2015 0624   AST 16 01/18/2015   ALT 13 01/18/2015   GFRNONAA >60 03/31/2015 0624   GFRAA >60 03/31/2015 0624    Assessment and Plan :  1. Generalized abdominal pain Worsening again. Will check labs. Add another Omeprazole tablet at lunch time.  2. Gastroesophageal reflux disease, esophagitis presence not specified  3. Other fatigue  4. Bowel incontinence Use Imodium as needed. Will refer to GI. 5.Diverticulitis possible Try doxycycline.f/u clinically. Patient was seen and examined by Dr. Eulas Post and note was scribed by Theressa Millard, RMA.    Miguel Aschoff MD [facility] 07/19/2016 2:27 PM

## 2016-07-20 ENCOUNTER — Ambulatory Visit: Payer: Medicare Other | Admitting: Family Medicine

## 2016-07-20 LAB — CBC WITH DIFFERENTIAL/PLATELET
BASOS ABS: 0 10*3/uL (ref 0.0–0.2)
Basos: 0 %
EOS (ABSOLUTE): 0.1 10*3/uL (ref 0.0–0.4)
Eos: 1 %
Hematocrit: 43.1 % (ref 37.5–51.0)
Hemoglobin: 14.7 g/dL (ref 12.6–17.7)
Immature Grans (Abs): 0 10*3/uL (ref 0.0–0.1)
Immature Granulocytes: 0 %
LYMPHS ABS: 1.9 10*3/uL (ref 0.7–3.1)
Lymphs: 26 %
MCH: 32.2 pg (ref 26.6–33.0)
MCHC: 34.1 g/dL (ref 31.5–35.7)
MCV: 95 fL (ref 79–97)
MONOS ABS: 0.6 10*3/uL (ref 0.1–0.9)
Monocytes: 9 %
NEUTROS ABS: 4.7 10*3/uL (ref 1.4–7.0)
Neutrophils: 64 %
Platelets: 199 10*3/uL (ref 150–379)
RBC: 4.56 x10E6/uL (ref 4.14–5.80)
RDW: 13.4 % (ref 12.3–15.4)
WBC: 7.3 10*3/uL (ref 3.4–10.8)

## 2016-07-20 LAB — COMPREHENSIVE METABOLIC PANEL
ALK PHOS: 82 IU/L (ref 39–117)
ALT: 19 IU/L (ref 0–44)
AST: 19 IU/L (ref 0–40)
Albumin/Globulin Ratio: 2 (ref 1.2–2.2)
Albumin: 4.2 g/dL (ref 3.5–4.8)
BILIRUBIN TOTAL: 0.6 mg/dL (ref 0.0–1.2)
BUN / CREAT RATIO: 20 (ref 10–24)
BUN: 18 mg/dL (ref 8–27)
CHLORIDE: 98 mmol/L (ref 96–106)
CO2: 25 mmol/L (ref 18–29)
CREATININE: 0.9 mg/dL (ref 0.76–1.27)
Calcium: 9.7 mg/dL (ref 8.6–10.2)
GFR calc Af Amer: 96 mL/min/{1.73_m2} (ref >59)
GFR calc non Af Amer: 83 mL/min/{1.73_m2} (ref >59)
GLOBULIN, TOTAL: 2.1 g/dL (ref 1.5–4.5)
GLUCOSE: 93 mg/dL (ref 65–99)
Potassium: 4.3 mmol/L (ref 3.5–5.2)
SODIUM: 140 mmol/L (ref 134–144)
Total Protein: 6.3 g/dL (ref 6.0–8.5)

## 2016-07-20 LAB — TSH: TSH: 6.45 u[IU]/mL — AB (ref 0.450–4.500)

## 2016-07-20 LAB — LIPASE: LIPASE: 43 U/L (ref 0–59)

## 2016-07-20 MED ORDER — LEVOTHYROXINE SODIUM 75 MCG PO TABS: 75.0000 ug | ORAL_TABLET | Freq: Every day | ORAL | 12 refills | Status: DC

## 2016-07-25 ENCOUNTER — Other Ambulatory Visit: Payer: Self-pay

## 2016-07-25 DIAGNOSIS — K5792 Diverticulitis of intestine, part unspecified, without perforation or abscess without bleeding: Secondary | ICD-10-CM

## 2016-07-25 MED ORDER — SULFAMETHOXAZOLE-TRIMETHOPRIM 800-160 MG PO TABS: 1.0000 | ORAL_TABLET | Freq: Two times a day (BID) | ORAL | 0 refills | Status: DC

## 2016-08-23 ENCOUNTER — Other Ambulatory Visit: Payer: Self-pay

## 2016-09-21 DIAGNOSIS — Z23 Encounter for immunization: Secondary | ICD-10-CM | POA: Diagnosis not present

## 2016-09-22 DIAGNOSIS — L821 Other seborrheic keratosis: Secondary | ICD-10-CM | POA: Diagnosis not present

## 2016-09-22 DIAGNOSIS — L570 Actinic keratosis: Secondary | ICD-10-CM | POA: Diagnosis not present

## 2016-09-22 DIAGNOSIS — C4441 Basal cell carcinoma of skin of scalp and neck: Secondary | ICD-10-CM | POA: Diagnosis not present

## 2016-09-22 DIAGNOSIS — X32XXXA Exposure to sunlight, initial encounter: Secondary | ICD-10-CM | POA: Diagnosis not present

## 2016-09-22 DIAGNOSIS — D485 Neoplasm of uncertain behavior of skin: Secondary | ICD-10-CM | POA: Diagnosis not present

## 2016-10-02 ENCOUNTER — Ambulatory Visit: Payer: Medicare Other | Attending: Rheumatology | Admitting: Occupational Therapy

## 2016-10-02 DIAGNOSIS — M25642 Stiffness of left hand, not elsewhere classified: Secondary | ICD-10-CM

## 2016-10-02 DIAGNOSIS — M6281 Muscle weakness (generalized): Secondary | ICD-10-CM

## 2016-10-02 DIAGNOSIS — M79642 Pain in left hand: Secondary | ICD-10-CM | POA: Diagnosis not present

## 2016-10-02 NOTE — Patient Instructions (Signed)
Ed on joint protection principles And AE   hand out provided   Pt to do moist heat  and then tendon glides AROM  Opposition to all digits 10 reps  2 x day

## 2016-10-02 NOTE — Therapy (Signed)
[place] [facility] [address], [place], [address] Phone: [phone]   Fax:  [phone]  Occupational Therapy Treatment  Patient Details  Name: Gary Mueller MRN: [id number] Date of Birth: 01-26-1941 Referring Provider: Jefm Bryant  Encounter Date: 10/02/2016      OT End of Session - 10/02/16 1712    Visit Number 1   Number of Visits 4   Date for OT Re-Evaluation 10/30/16   OT Start Time 0810   OT Stop Time 0901   OT Time Calculation (min) 51 min   Activity Tolerance Patient tolerated treatment well   Behavior During Therapy [facility] for tasks assessed/performed      Past Medical History:  Diagnosis Date  . Allergy   . Anxiety   . Depression   . GERD (gastroesophageal reflux disease)   . Hyperlipidemia   . Hypertension   . Thyroid disease     Past Surgical History:  Procedure Laterality Date  . CHOLECYSTECTOMY  1968  . HERNIA REPAIR Right    inguinal hernia repair  . TOTAL KNEE ARTHROPLASTY Left 03/29/2015   [facility] Dr. Marry Guan    There were no vitals filed for this visit.      Subjective Assessment - 10/02/16 1705    Currently in Pain? 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[employer],  R hand  domiinant , likes to read, play golf , some yard work      Pharmacist, community Grip (lbs) 57   Right Hand Lateral Pinch 14 lbs   Right Hand 3 Point Pinch 14 lbs   Left Hand Grip (lbs) 40   Left Hand Lateral Pinch 10 lbs   Left Hand 3 Point Pinch 8 lbs     Right Hand AROM   R Index  MCP 0-90 90 Degrees   R Index PIP 0-100 90 Degrees   R Long  MCP 0-90 90 Degrees   R Long PIP 0-100 80 Degrees   R Ring  MCP 0-90 90 Degrees   R Ring PIP 0-100 95 Degrees   R Little  MCP 0-90 95 Degrees   R Little PIP 0-100 85 Degrees     Left Hand AROM   L Index  MCP 0-90 84 Degrees   L Index PIP 0-100 80 Degrees   L Long  MCP 0-90 90 Degrees   L Long PIP 0-100 82 Degrees   L Ring  MCP 0-90 90 Degrees   L Ring PIP 0-100 90 Degrees   L Little  MCP 0-90 90 Degrees   L Little PIP 0-100 80 Degrees     Fluido therapy done prior to review of HEP   Pt to do heat prior to ROM - pain decrease to about 1-2/10    Ed on joint protection principles And AE   hand out provided   Pt to do moist  heat  and then tendon glides AROM  Opposition to all digits 10 reps  2 x day                         OT Education - 10/02/16 1711    Education provided Yes   Education Details HEP and joint protection /AE education done    [employer]) Educated Patient   Methods Explanation;Demonstration;Tactile cues;Verbal cues;Handout   Comprehension Verbal cues required;Returned demonstration;Verbalized understanding          OT Short Term Goals - 10/02/16 1757      OT SHORT TERM GOAL #1   Title Pain on PRWHE  improve with 15 points    Baseline pain on PRWHE at eval 31/50   Time 2   Period Weeks   Status New     OT SHORT TERM GOAL #2   Title Pt to be ind in HEP to increase AROM in L hand digits and decrease pain    Baseline see flowsheet for AROM - MC's and PIP 's decrease    Time 3   Period Weeks   Status New           OT Long Term Goals - 10/02/16 1759      OT LONG TERM GOAL #1   Title Pt verbalize 3 joint protection principles and AE to increase ease at home    Baseline no to very little knowledge   Time 4   Period Weeks   Status New     OT LONG TERM GOAL #2   Title L grip strength improve with at least 5-10 lbs to  report increase functional use on PRWHE   Baseline grip strength R  57/ L 40 lbs - function on PRWHE 30.5/50   Time 4   Period Weeks   Status New               Plan - 10/02/16 1712    Clinical Impression Statement Pt present with diagnosis of arthritis with L hand pain more than R - pt show decrease AROM in digits and decrease grip strength in L - most pain in 3rd digit - all limiting his functional use in  ADL's and IADL's - or increase pain after use of R hand    Rehab Potential Good   OT Frequency 1x / week   OT Duration 4 weeks   OT Treatment/Interventions Self-care/ADL training;Fluidtherapy   Plan assess progress in pain and ROM with Home program   OT Home Exercise Plan see pt instruction    Consulted and Agree with Plan of Care Patient      Patient will benefit from skilled therapeutic intervention in order to improve the following deficits and impairments:  Decreased range of motion, Impaired flexibility, Pain, Impaired UE functional use, Decreased strength  Visit Diagnosis: Pain in left hand - Plan: Ot plan of care cert/re-cert  Stiffness of left hand, not elsewhere classified - Plan: Ot plan of care cert/re-cert  Muscle weakness (generalized) - Plan: Ot plan of care cert/re-cert    Problem List Patient Active Problem List   Diagnosis Date Noted  . Anxiety 06/17/2015  . Allergic rhinitis 06/17/2015  . CAD in native artery 06/17/2015  . A-fib ([place]) 06/17/2015  . Back ache 06/17/2015  . Gonalgia 06/17/2015  . Clinical depression 06/17/2015  . Dizziness 06/17/2015  . Polypharmacy 06/17/2015  . Fatigue 06/17/2015  . Cold sore 06/17/2015  . Difficulty hearing 06/17/2015  .  HLD (hyperlipidemia) 06/17/2015  . BP (high blood pressure) 06/17/2015  . Adult hypothyroidism 06/17/2015  . Adaptive colitis 06/17/2015  . Cannot sleep 06/17/2015  . Meniere's disease 06/17/2015  . Gastroduodenal ulcer 06/17/2015  . Gastro-esophageal reflux disease without esophagitis 06/17/2015  . Buzzing in ear 06/17/2015  . Beat, premature  ventricular 06/17/2015  . H/O total knee replacement 04/13/2015    Rosalyn Gess OTR/L,CLT  10/02/2016, 6:07 PM  [place] [facility] [address] [place], [address] Phone: [phone]   Fax:  [phone]  Name: Gary Mueller MRN: [id number] Date of Birth: 11-07-1941

## 2016-10-11 ENCOUNTER — Encounter: Payer: Medicare Other | Admitting: Occupational Therapy

## 2016-10-16 ENCOUNTER — Ambulatory Visit: Payer: Medicare Other | Admitting: Occupational Therapy

## 2016-10-17 ENCOUNTER — Other Ambulatory Visit: Payer: Self-pay

## 2016-10-17 DIAGNOSIS — F419 Anxiety disorder, unspecified: Secondary | ICD-10-CM

## 2016-10-17 MED ORDER — ALPRAZOLAM 0.5 MG PO TABS: 0.5000 mg | ORAL_TABLET | Freq: Two times a day (BID) | ORAL | 5 refills | Status: DC | PRN

## 2016-10-17 NOTE — Telephone Encounter (Signed)
Refill request from [facility]

## 2016-10-20 DIAGNOSIS — L905 Scar conditions and fibrosis of skin: Secondary | ICD-10-CM | POA: Diagnosis not present

## 2016-10-20 DIAGNOSIS — C4491 Basal cell carcinoma of skin, unspecified: Secondary | ICD-10-CM

## 2016-10-20 DIAGNOSIS — C801 Malignant (primary) neoplasm, unspecified: Secondary | ICD-10-CM

## 2016-10-20 DIAGNOSIS — C4441 Basal cell carcinoma of skin of scalp and neck: Secondary | ICD-10-CM | POA: Diagnosis not present

## 2016-10-20 HISTORY — DX: Basal cell carcinoma of skin, unspecified: C44.91

## 2016-10-20 HISTORY — DX: Malignant (primary) neoplasm, unspecified: C80.1

## 2016-10-23 ENCOUNTER — Ambulatory Visit: Payer: Medicare Other | Admitting: Occupational Therapy

## 2016-10-23 DIAGNOSIS — M6281 Muscle weakness (generalized): Secondary | ICD-10-CM

## 2016-10-23 DIAGNOSIS — M79642 Pain in left hand: Secondary | ICD-10-CM

## 2016-10-23 DIAGNOSIS — M25642 Stiffness of left hand, not elsewhere classified: Secondary | ICD-10-CM | POA: Diagnosis not present

## 2016-10-23 NOTE — Patient Instructions (Signed)
Cont with paraffin   Tendon glides  Opposition  10 reps each Reviewed again joint protection principles - avoid tight and sustained grip  As well as  Use larger joints

## 2016-10-23 NOTE — Therapy (Signed)
[place] [facility] [address] [place], [address] Phone: [phone]   Fax:  [phone]  Occupational Therapy Treatment/discharge  Patient Details  Name: Gary Mueller MRN: [id number] Date of Birth: 12-21-1941 Referring Provider: Jefm Bryant  Encounter Date: 10/23/2016      OT End of Session - 10/23/16 1159    Visit Number 2   Number of Visits 2   Date for OT Re-Evaluation 10/23/16   OT Start Time 1112   OT Stop Time 1155   OT Time Calculation (min) 43 min   Activity Tolerance Patient tolerated treatment well   Behavior During Therapy [facility] for tasks assessed/performed      Past Medical History:  Diagnosis Date  . Allergy   . Anxiety   . Depression   . GERD (gastroesophageal reflux disease)   . Hyperlipidemia   . Hypertension   . Thyroid disease     Past Surgical History:  Procedure Laterality Date  . CHOLECYSTECTOMY  1968  . HERNIA REPAIR Right    inguinal hernia repair  . TOTAL KNEE ARTHROPLASTY Left 03/29/2015   [facility] Dr. Marry Guan    There were no vitals filed for this visit.      Subjective Assessment - 10/23/16 1113    Subjective  DId do parafffin 3 x but I make to much of mess -pain better and did my exercises  - and I played and my finger did not hurt as bad - trying to hold and pick up object more with my palms   Patient Stated Goals Want to get my hand pain better, that I can use them with more ease- I do not want to loose the function - play golf with less pain , reading , pick up small objects    Currently in Pain? Yes   Pain Score 4    Pain Location Hand   Pain Orientation Right;Left   Pain Descriptors / Indicators Aching   Pain Onset More than a month ago            [facility] OT Assessment - 10/23/16 0001      Strength   Right Hand Grip (lbs) 57   Right Hand Lateral Pinch 17 lbs   Right Hand 3 Point Pinch 14 lbs   Left Hand Grip (lbs) 40  46 after paraffin bath   Left Hand  Lateral Pinch 14 lbs   Left Hand 3 Point Pinch 11 lbs     Right Hand AROM   R Index  MCP 0-90 90 Degrees   R Index PIP 0-100 95 Degrees   R Long  MCP 0-90 90 Degrees   R Long PIP 0-100 85 Degrees   R Ring  MCP 0-90 90 Degrees   R Ring PIP 0-100 100 Degrees   R Little  MCP 0-90 95 Degrees   R Little PIP 0-100 90 Degrees     Left Hand AROM   L Index  MCP 0-90 85 Degrees   L Index PIP 0-100 85 Degrees   L Long  MCP 0-90 90 Degrees   L Long PIP 0-100 95 Degrees   L Ring  MCP 0-90 90 Degrees   L Ring PIP 0-100 95 Degrees   L Little  MCP 0-90 90 Degrees   L Little PIP 0-100 85 Degrees    Measured ROM and grip/prehension - see flowsheet Paraffin bath for bilateral hands  - to decrease pain and stiffness ( ed pt on  use of paraffin at home -not to make mess) - pt report he has knowledge how to use it now at home)  Reviewed HEP with pt  Tendon glides  Opposition  Needed min A and mod v/c  Did show increase ROM again after paraffin and grip in L hand increase from 40 to 46 lbs  Reviewed again joint protection principles - avoid tight and sustained grip  As well as  Use larger joints               OT Treatments/Exercises (OP) - 10/23/16 0001      RUE Paraffin   Number Minutes Paraffin 10 Minutes   RUE Paraffin Location Hand   Comments Prior to review of HEP - decrease pain and increase ROM      LUE Paraffin   Number Minutes Paraffin 10 Minutes   LUE Paraffin Location Hand   Comments Prior to review HEP                 OT Education - 10/23/16 1159    Education provided Yes   Education Details HEP and joint protection for discharge   Person(s) Educated Patient   Methods Explanation;Demonstration;Tactile cues;Verbal cues   Comprehension Verbalized understanding;Returned demonstration;Verbal cues required          OT Short Term Goals - 10/23/16 1201      OT SHORT TERM GOAL #1   Title Pain on PRWHE  improve with 15 points    Baseline pain on PRWHE at  eval 31/50 and now 18 /50   Status Partially Met     OT SHORT TERM GOAL #2   Title Pt to be ind in HEP to increase AROM in L hand digits and decrease pain    Status Achieved           OT Long Term Goals - 10/23/16 1201      OT LONG TERM GOAL #1   Title Pt verbalize 3 joint protection principles and AE to increase ease at home    Status Achieved     OT LONG TERM GOAL #2   Title L grip strength improve with at least 5-10 lbs to  report increase functional use on PRWHE   Baseline grip strength R 57/ L 40 lbs - 46lbs in L hand after paraffin    Status Achieved               Plan - 10/23/16 1159    Clinical Impression Statement Pt show increase AROM in bilateral hands - and increase grip/prehension on L hand - pt has knowledge on joint protection and using it- report had less pain in 2nd digit during  golfing -  pt met all goals and discharge with HEP    Rehab Potential Good   OT Treatment/Interventions Self-care/ADL training;Fluidtherapy   Plan discharge with HEP   OT Home Exercise Plan see pt instruction for discharge HEP   Consulted and Agree with Plan of Care Patient      Patient will benefit from skilled therapeutic intervention in order to improve the following deficits and impairments:  Decreased range of motion, Impaired flexibility, Pain, Impaired UE functional use, Decreased strength  Visit Diagnosis: Pain in left hand  Stiffness of left hand, not elsewhere classified  Muscle weakness (generalized)    Problem List Patient Active Problem List   Diagnosis Date Noted  . Anxiety 06/17/2015  . Allergic rhinitis 06/17/2015  . CAD in native artery 06/17/2015  . A-fib ([place])  06/17/2015  . Back ache 06/17/2015  . Gonalgia 06/17/2015  . Clinical depression 06/17/2015  . Dizziness 06/17/2015  . Polypharmacy 06/17/2015  . Fatigue 06/17/2015  . Cold sore 06/17/2015  . Difficulty hearing 06/17/2015  . HLD (hyperlipidemia) 06/17/2015  . BP (high blood  pressure) 06/17/2015  . Adult hypothyroidism 06/17/2015  . Adaptive colitis 06/17/2015  . Cannot sleep 06/17/2015  . Meniere's disease 06/17/2015  . Gastroduodenal ulcer 06/17/2015  . Gastro-esophageal reflux disease without esophagitis 06/17/2015  . Buzzing in ear 06/17/2015  . Beat, premature ventricular 06/17/2015  . H/O total knee replacement 04/13/2015    Rosalyn Gess OTR/L,CLT 10/23/2016, 12:02 PM  [place] [facility] [address] [place], [address] Phone: [phone]   Fax:  [phone]  Name: Gary Mueller MRN: [id number] Date of Birth: June 22, 1941

## 2016-11-07 DIAGNOSIS — M79671 Pain in right foot: Secondary | ICD-10-CM | POA: Diagnosis not present

## 2016-11-07 DIAGNOSIS — G8929 Other chronic pain: Secondary | ICD-10-CM | POA: Diagnosis not present

## 2016-11-07 DIAGNOSIS — M150 Primary generalized (osteo)arthritis: Secondary | ICD-10-CM | POA: Diagnosis not present

## 2016-11-13 ENCOUNTER — Telehealth: Payer: Self-pay | Admitting: Family Medicine

## 2016-11-13 NOTE — Telephone Encounter (Signed)
Spoke with patient on the phone and reprinted labs ordered back in June.KW

## 2016-11-13 NOTE — Telephone Encounter (Addendum)
Pt called stating he has lost his lab sheet that was printed out for him and was wondering if the nurse could reprint another one this evening for pt come to by and pick it up. Thanks CC

## 2016-11-14 DIAGNOSIS — I10 Essential (primary) hypertension: Secondary | ICD-10-CM | POA: Diagnosis not present

## 2016-11-14 DIAGNOSIS — E039 Hypothyroidism, unspecified: Secondary | ICD-10-CM | POA: Diagnosis not present

## 2016-11-14 DIAGNOSIS — E7800 Pure hypercholesterolemia, unspecified: Secondary | ICD-10-CM | POA: Diagnosis not present

## 2016-11-14 DIAGNOSIS — R5383 Other fatigue: Secondary | ICD-10-CM | POA: Diagnosis not present

## 2016-11-15 LAB — CBC WITH DIFFERENTIAL/PLATELET
Basophils Absolute: 0 10*3/uL (ref 0.0–0.2)
Basos: 0 %
EOS (ABSOLUTE): 0.1 10*3/uL (ref 0.0–0.4)
EOS: 1 %
HEMOGLOBIN: 15.7 g/dL (ref 12.6–17.7)
Hematocrit: 44.9 % (ref 37.5–51.0)
Immature Grans (Abs): 0 10*3/uL (ref 0.0–0.1)
Immature Granulocytes: 0 %
LYMPHS ABS: 2.3 10*3/uL (ref 0.7–3.1)
LYMPHS: 29 %
MCH: 31.8 pg (ref 26.6–33.0)
MCHC: 35 g/dL (ref 31.5–35.7)
MCV: 91 fL (ref 79–97)
Monocytes Absolute: 0.6 10*3/uL (ref 0.1–0.9)
Monocytes: 7 %
NEUTROS ABS: 5 10*3/uL (ref 1.4–7.0)
Neutrophils: 63 %
Platelets: 218 10*3/uL (ref 150–379)
RBC: 4.94 x10E6/uL (ref 4.14–5.80)
RDW: 13.3 % (ref 12.3–15.4)
WBC: 7.9 10*3/uL (ref 3.4–10.8)

## 2016-11-15 LAB — COMPREHENSIVE METABOLIC PANEL
ALBUMIN: 4.6 g/dL (ref 3.5–4.8)
ALT: 15 IU/L (ref 0–44)
AST: 16 IU/L (ref 0–40)
Albumin/Globulin Ratio: 2.3 — ABNORMAL HIGH (ref 1.2–2.2)
Alkaline Phosphatase: 85 IU/L (ref 39–117)
BILIRUBIN TOTAL: 0.7 mg/dL (ref 0.0–1.2)
BUN/Creatinine Ratio: 20 (ref 10–24)
BUN: 20 mg/dL (ref 8–27)
CO2: 27 mmol/L (ref 18–29)
Calcium: 9.6 mg/dL (ref 8.6–10.2)
Chloride: 100 mmol/L (ref 96–106)
Creatinine, Ser: 1.01 mg/dL (ref 0.76–1.27)
GFR calc non Af Amer: 71 mL/min/{1.73_m2} (ref >59)
GFR, EST AFRICAN AMERICAN: 83 mL/min/{1.73_m2} (ref >59)
GLOBULIN, TOTAL: 2 g/dL (ref 1.5–4.5)
GLUCOSE: 108 mg/dL — AB (ref 65–99)
Potassium: 4.2 mmol/L (ref 3.5–5.2)
SODIUM: 141 mmol/L (ref 134–144)
TOTAL PROTEIN: 6.6 g/dL (ref 6.0–8.5)

## 2016-11-15 LAB — LIPID PANEL WITH LDL/HDL RATIO
Cholesterol, Total: 228 mg/dL — ABNORMAL HIGH (ref 100–199)
HDL: 43 mg/dL (ref >39)
LDL CALC: 164 mg/dL — AB (ref 0–99)
LDl/HDL Ratio: 3.8 ratio units — ABNORMAL HIGH (ref 0.0–3.6)
Triglycerides: 107 mg/dL (ref 0–149)
VLDL CHOLESTEROL CAL: 21 mg/dL (ref 5–40)

## 2016-11-15 LAB — TSH: TSH: 4.75 u[IU]/mL — AB (ref 0.450–4.500)

## 2016-11-21 ENCOUNTER — Telehealth: Payer: Self-pay

## 2016-11-21 NOTE — Telephone Encounter (Signed)
Patient has been advised. KW 

## 2016-11-21 NOTE — Telephone Encounter (Signed)
-----   Message from Jerrol Banana., MD sent at 11/21/2016  8:12 AM EST ----- Labs stable

## 2016-11-30 DIAGNOSIS — I493 Ventricular premature depolarization: Secondary | ICD-10-CM | POA: Diagnosis not present

## 2016-11-30 DIAGNOSIS — I2510 Atherosclerotic heart disease of native coronary artery without angina pectoris: Secondary | ICD-10-CM | POA: Diagnosis not present

## 2016-12-12 ENCOUNTER — Encounter: Payer: Self-pay | Admitting: Family Medicine

## 2016-12-12 ENCOUNTER — Ambulatory Visit (INDEPENDENT_AMBULATORY_CARE_PROVIDER_SITE_OTHER): Payer: Medicare Other

## 2016-12-12 ENCOUNTER — Ambulatory Visit (INDEPENDENT_AMBULATORY_CARE_PROVIDER_SITE_OTHER): Payer: Medicare Other | Admitting: Family Medicine

## 2016-12-12 VITALS — BP 132/68 | HR 64 | Temp 98.1°F | Ht 70.0 in | Wt 189.0 lb

## 2016-12-12 VITALS — BP 132/69 | HR 64 | Temp 98.1°F | Ht 70.0 in | Wt 189.4 lb

## 2016-12-12 DIAGNOSIS — Z23 Encounter for immunization: Secondary | ICD-10-CM

## 2016-12-12 DIAGNOSIS — E7800 Pure hypercholesterolemia, unspecified: Secondary | ICD-10-CM

## 2016-12-12 DIAGNOSIS — K4090 Unilateral inguinal hernia, without obstruction or gangrene, not specified as recurrent: Secondary | ICD-10-CM | POA: Diagnosis not present

## 2016-12-12 DIAGNOSIS — I10 Essential (primary) hypertension: Secondary | ICD-10-CM

## 2016-12-12 DIAGNOSIS — Z0000 Encounter for general adult medical examination without abnormal findings: Secondary | ICD-10-CM | POA: Diagnosis not present

## 2016-12-12 DIAGNOSIS — E039 Hypothyroidism, unspecified: Secondary | ICD-10-CM | POA: Diagnosis not present

## 2016-12-12 NOTE — Progress Notes (Addendum)
Subjective:   Gary Mueller is a 75 y.o. male who presents for Medicare Annual/Subsequent preventive examination.  Review of Systems:  N/A  Cardiac Risk Factors include: advanced age (>52men, >91 women);dyslipidemia;hypertension;male gender     Objective:    Vitals: BP 132/69 (BP Location: Right Arm)   Pulse 64   Temp 98.1 F (36.7 C) (Oral)   Ht 5\' 10"  (1.778 m)   Wt 189 lb 6 oz (85.9 kg)   BMI 27.17 kg/m   Body mass index is 27.17 kg/m.  Tobacco History  Smoking Status  . Former Smoker  . Types: Cigarettes  . Quit date: 12/24/1964  Smokeless Tobacco  . Never Used     Counseling given: Not Answered   Past Medical History:  Diagnosis Date  . Allergy   . Anxiety   . Depression   . GERD (gastroesophageal reflux disease)   . Hyperlipidemia   . Hypertension   . Thyroid disease    Past Surgical History:  Procedure Laterality Date  . CHOLECYSTECTOMY  1968  . HERNIA REPAIR Right    inguinal hernia repair  . TOTAL KNEE ARTHROPLASTY Left 03/29/2015   [facility] Dr. Marry Guan   Family History  Problem Relation Age of Onset  . Heart disease Mother     A fib  . Healthy Sister    History  Sexual Activity  . Sexual activity: Not on file    Outpatient Encounter Prescriptions as of 12/12/2016  Medication Sig  . acyclovir (ZOVIRAX) 400 MG tablet Take by mouth. Reported on 04/12/2016  . ALPRAZolam (XANAX) 0.5 MG tablet Take 1 tablet (0.5 mg total) by mouth 2 (two) times daily as needed for anxiety.  . Ascorbic Acid (VITAMIN C) 1000 MG tablet Take by mouth.  Marland Kitchen aspirin 81 MG tablet Take by mouth.  . cholecalciferol (VITAMIN D) 1000 units tablet Take 1,000 Units by mouth daily.   Marland Kitchen levothyroxine (SYNTHROID, LEVOTHROID) 75 MCG tablet Take 1 tablet (75 mcg total) by mouth daily.  . MULTIPLE VITAMIN PO Take by mouth.  Marland Kitchen omeprazole (PRILOSEC) 20 MG capsule Take 1 capsule (20 mg total) by mouth daily. (Patient taking differently: Take 20 mg by mouth daily. )  . ranitidine  (ZANTAC) 150 MG tablet Take by mouth. Reported on 04/12/2016  . sulfamethoxazole-trimethoprim (BACTRIM DS,SEPTRA DS) 800-160 MG tablet Take 1 tablet by mouth 2 (two) times daily. (Patient not taking: Reported on 12/12/2016)   No facility-administered encounter medications on file as of 12/12/2016.     Activities of Daily Living In your present state of health, do you have any difficulty performing the following activities: 12/12/2016  Hearing? Y  Vision? Y  Difficulty concentrating or making decisions? N  Walking or climbing stairs? N  Dressing or bathing? N  Doing errands, shopping? N  Preparing Food and eating ? N  Using the Toilet? N  In the past six months, have you accidently leaked urine? N  Do you have problems with loss of bowel control? N  Managing your Medications? N  Managing your Finances? N  Housekeeping or managing your Housekeeping? N  Some recent data might be hidden    Patient Care Team: Jerrol Banana., MD as PCP - General (Family Medicine) Ronnell Freshwater, MD as Referring Physician (Ophthalmology)   Assessment:     Exercise Activities and Dietary recommendations Current Exercise Habits: Home exercise routine, Type of exercise: walking;yoga, Time (Minutes): 20 (15-30), Frequency (Times/Week): 7, Weekly Exercise (Minutes/Week): 140, Intensity: Mild  Goals    .  Increase water intake          Starting 12/12/16, I will increase my water intake to 5-6 glasses a day.      Fall Risk Fall Risk  12/12/2016 08/23/2016 06/17/2015  Falls in the past year? Yes Yes No  Number falls in past yr: 1 1 -  Injury with Fall? No No -  Follow up Falls prevention discussed - -   Depression Screen PHQ 2/9 Scores 12/12/2016 06/17/2015  PHQ - 2 Score 1 0    Cognitive Function     6CIT Screen 12/12/2016  What Year? 0 points  What month? 0 points  What time? 0 points  Count back from 20 0 points  Months in reverse 0 points  Repeat phrase 2 points  Total  Score 2    Immunization History  Administered Date(s) Administered  . Influenza-Unspecified 09/24/2013  . Pneumococcal Conjugate-13 12/12/2016  . Pneumococcal Polysaccharide-23 08/10/1998  . Zoster 12/12/2007   Screening Tests Health Maintenance  Topic Date Due  . TETANUS/TDAP  12/12/2017 (Originally 10/19/1960)  . PNA vac Low Risk Adult (2 of 2 - PPSV23) 12/12/2017  . INFLUENZA VACCINE  Completed  . ZOSTAVAX  Completed      Plan:  I have personally reviewed and addressed the Medicare Annual Wellness questionnaire and have noted the following in the patient's chart:  A. Medical and social history B. Use of alcohol, tobacco or illicit drugs  C. Current medications and supplements D. Functional ability and status E.  Nutritional status F.  Physical activity G. Advance directives H. List of other physicians I.  Hospitalizations, surgeries, and ER visits in previous 12 months J.  [place] such as hearing and vision if needed, cognitive and depression L. Referrals and appointments - none  In addition, I have reviewed and discussed with patient certain preventive protocols, quality metrics, and best practice recommendations. A written personalized care plan for preventive services as well as general preventive health recommendations were provided to patient.  See attached scanned questionnaire for additional information.   Signed,  Fabio Neighbors, LPN Nurse Health Advisor   MD Recommendations: none I have reviewed the health advisors note, was  available for consultation and I agree with documentation and plan. Miguel Aschoff MD [facility]

## 2016-12-12 NOTE — Progress Notes (Signed)
Subjective:  HPI Pt is here for a follow up of chronic problems. Gary Mueller reports that Gary Mueller is doing well. However Gary Mueller wonders if Gary Mueller may have a hernia because Gary Mueller was trying to help his wife after having surgery and Gary Mueller tried to lift her and felt a pain in his left groin area. Gary Mueller reports that Gary Mueller thinks Gary Mueller feels a bulge but is unsure.    Pt was seen earlier this afternoon for his AWE with [facility].  Gary Mueller is married father of 3. 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 Gary Mueller has a daughter who is doing well. Gary Mueller has one son who has chronic medical problems and some mental retardation. Gary Mueller has another son who is not working as an Government social research officer. because of alcoholism. His wife has severe chronic heart disease in the past year has  been diagnosed with ovarian cancer. Prior to Admission medications   Medication Sig Start Date End Date Taking? Authorizing Provider  acyclovir (ZOVIRAX) 400 MG tablet Take by mouth. Reported on 04/12/2016 03/22/15   Historical Provider, MD  ALPRAZolam Duanne Moron) 0.5 MG tablet Take 1 tablet (0.5 mg total) by mouth 2 (two) times daily as needed for anxiety. 10/17/16   Jatavia Keltner Maceo Pro., MD  Ascorbic Acid (VITAMIN C) 1000 MG tablet Take by mouth.    Historical Provider, MD  aspirin 81 MG tablet Take by mouth.    Historical Provider, MD  cholecalciferol (VITAMIN D) 1000 units tablet Take 1,000 Units by mouth daily.     Historical Provider, MD  diclofenac sodium (VOLTAREN) 1 % GEL  11/07/16   Historical Provider, MD  levothyroxine (SYNTHROID, LEVOTHROID) 75 MCG tablet Take 1 tablet (75 mcg total) by mouth daily. 07/20/16   Jerlean Peralta Maceo Pro., MD  MULTIPLE VITAMIN PO Take by mouth. 11/29/11   Historical Provider, MD  omeprazole (PRILOSEC) 20 MG capsule Take 1 capsule (20 mg total) by mouth daily. Patient taking differently: Take 20 mg by mouth daily.  04/12/16   Manu Rubey Maceo Pro., MD  ranitidine (ZANTAC) 150 MG tablet Take by mouth. Reported on 04/12/2016 02/24/14   Historical Provider, MD  sulfamethoxazole-trimethoprim  (BACTRIM DS,SEPTRA DS) 800-160 MG tablet Take 1 tablet by mouth 2 (two) times daily. Patient not taking: Reported on 12/12/2016 07/25/16   Jerrol Banana., MD    Patient Active Problem List   Diagnosis Date Noted  . Anxiety 06/17/2015  . Allergic rhinitis 06/17/2015  . CAD in native artery 06/17/2015  . A-fib ([place]) 06/17/2015  . Back ache 06/17/2015  . Gonalgia 06/17/2015  . Clinical depression 06/17/2015  . Dizziness 06/17/2015  . Polypharmacy 06/17/2015  . Fatigue 06/17/2015  . Cold sore 06/17/2015  . Difficulty hearing 06/17/2015  . HLD (hyperlipidemia) 06/17/2015  . BP (high blood pressure) 06/17/2015  . Adult hypothyroidism 06/17/2015  . Adaptive colitis 06/17/2015  . Cannot sleep 06/17/2015  . Meniere's disease 06/17/2015  . Gastroduodenal ulcer 06/17/2015  . Gastro-esophageal reflux disease without esophagitis 06/17/2015  . Buzzing in ear 06/17/2015  . Beat, premature ventricular 06/17/2015  . H/O total knee replacement 04/13/2015    Past Medical History:  Diagnosis Date  . Allergy   . Anxiety   . Depression   . GERD (gastroesophageal reflux disease)   . Hyperlipidemia   . Hypertension   . Thyroid disease     Social History   Social History  . Marital status: Married    Spouse name: Lelon Frohlich  . Number of children: 3  . Years of  education: N/A   Occupational History  . full time    Social History Main Topics  . Smoking status: Former Smoker    Types: Cigarettes    Quit date: 12/24/1964  . Smokeless tobacco: Never Used  . Alcohol use No     Comment: beer or wine occasionally  . Drug use: No  . Sexual activity: Not on file   Other Topics Concern  . Not on file   Social History Narrative  . No narrative on file    Allergies  Allergen Reactions  . Codeine     GI intolerance    Review of Systems  Constitutional: Negative.   HENT: Positive for congestion, hearing loss and sinus pain.   Eyes: Positive for photophobia.  Respiratory:  Negative.   Cardiovascular: Negative.   Gastrointestinal: Positive for abdominal pain.  Musculoskeletal: Positive for joint pain.  Skin: Negative.   Neurological: Negative.   Endo/Heme/Allergies: Negative.   Psychiatric/Behavioral: Negative.     Immunization History  Administered Date(s) Administered  . Influenza-Unspecified 09/24/2013  . Pneumococcal Conjugate-13 12/12/2016  . Pneumococcal Polysaccharide-23 08/10/1998  . Tdap 05/21/2012, 05/21/2012  . Zoster 12/12/2007    Objective:  BP 132/68   Pulse 64   Temp 98.1 F (36.7 C) (Oral)   Ht 5\' 10"  (1.778 m)   Wt 189 lb (85.7 kg)   BMI 27.12 kg/m   Physical Exam  Constitutional: Gary Mueller is oriented to person, place, and time and well-developed, well-nourished, and in no distress.  HENT:  Head: Normocephalic and atraumatic.  Right Ear: External ear normal.  Left Ear: External ear normal.  Nose: Nose normal.  Mouth/Throat: Oropharynx is clear and moist.  Eyes: Conjunctivae and EOM are normal. Pupils are equal, round, and reactive to light. No scleral icterus.  Neck: Neck supple. No thyromegaly present.  Cardiovascular: Normal rate, regular rhythm, normal heart sounds and intact distal pulses.   Pulmonary/Chest: Effort normal and breath sounds normal.  Abdominal: Soft.  Genitourinary: Rectum normal, prostate normal and penis normal.  Genitourinary Comments: Reducible small left inguinal hernia.  Musculoskeletal: Gary Mueller exhibits no edema.  Neurological: Gary Mueller is alert and oriented to person, place, and time. Gait normal. GCS score is 15.  Skin: Skin is warm and dry.  Psychiatric: Mood, memory, affect and judgment normal.    Lab Results  Component Value Date   WBC 7.9 11/14/2016   HGB 12.2 (L) 03/31/2015   HCT 44.9 11/14/2016   PLT 218 11/14/2016   GLUCOSE 108 (H) 11/14/2016   CHOL 228 (H) 11/14/2016   TRIG 107 11/14/2016   HDL 43 11/14/2016   LDLCALC 164 (H) 11/14/2016   TSH 4.750 (H) 11/14/2016   INR 1.0 03/17/2015     CMP     Component Value Date/Time   NA 141 11/14/2016 0805   NA 137 03/31/2015 0624   K 4.2 11/14/2016 0805   K 3.6 03/31/2015 0624   CL 100 11/14/2016 0805   CL 102 03/31/2015 0624   CO2 27 11/14/2016 0805   CO2 29 03/31/2015 0624   GLUCOSE 108 (H) 11/14/2016 0805   GLUCOSE 100 (H) 03/31/2015 0624   BUN 20 11/14/2016 0805   BUN 14 03/31/2015 0624   CREATININE 1.01 11/14/2016 0805   CREATININE 0.79 03/31/2015 0624   CALCIUM 9.6 11/14/2016 0805   CALCIUM 8.4 (L) 03/31/2015 0624   PROT 6.6 11/14/2016 0805   ALBUMIN 4.6 11/14/2016 0805   AST 16 11/14/2016 0805   ALT 15 11/14/2016 0805   ALKPHOS  85 11/14/2016 0805   BILITOT 0.7 11/14/2016 0805   GFRNONAA 71 11/14/2016 0805   GFRNONAA >60 03/31/2015 0624   GFRAA 83 11/14/2016 0805   GFRAA >60 03/31/2015 0624    Assessment and Plan :  1. Essential hypertension Stable.   2. Hypercholesteremia   3. Adult hypothyroidism   4. Inguinal hernia, left Dr. Bary Castilla did the right side.  - Ambulatory referral to General Surgery Last colonoscopy Dr Candace Cruise 04/21/13. 5.GERD 6.OA 7.Occasional PVCs I have done the exam and reviewed the above chart and it is accurate to the best of my knowledge. Development worker, community has been used in this note in any air is in the dictation or transcription are unintentional.  [facility] 12/12/2016 1:59 PM

## 2016-12-12 NOTE — Patient Instructions (Signed)
Gary Mueller , Thank you for taking time to come for your Medicare Wellness Visit. I appreciate your ongoing commitment to your health goals. Please review the following plan we discussed and let me know if I can assist you in the future.   These are the goals we discussed: Goals    . Increase water intake          Starting 12/12/16, I will increase my water intake to 5-6 glasses a day.       This is a list of the screening recommended for you and due dates:  Health Maintenance  Topic Date Due  . Tetanus Vaccine  12/12/2017*  . Pneumonia vaccines (2 of 2 - PPSV23) 12/12/2017  . Flu Shot  Completed  . Shingles Vaccine  Completed  *Topic was postponed. The date shown is not the original due date.   Preventive Care for Adults  A healthy lifestyle and preventive care can promote health and wellness. Preventive health guidelines for adults include the following key practices.  . A routine yearly physical is a good way to check with your health care provider about your health and preventive screening. It is a chance to share any concerns and updates on your health and to receive a thorough exam.  . Visit your dentist for a routine exam and preventive care every 6 months. Brush your teeth twice a day and floss once a day. Good oral hygiene prevents tooth decay and gum disease.  . The frequency of eye exams is based on your age, health, family medical history, use  of contact lenses, and other factors. Follow your health care provider's ecommendations for frequency of eye exams.  . Eat a healthy diet. Foods like vegetables, fruits, whole grains, low-fat dairy products, and lean protein foods contain the nutrients you need without too many calories. Decrease your intake of foods high in solid fats, added sugars, and salt. Eat the right amount of calories for you. Get information about a proper diet from your health care provider, if necessary.  . Regular physical exercise is one of the most  important things you can do for your health. Most adults should get at least 150 minutes of moderate-intensity exercise (any activity that increases your heart rate and causes you to sweat) each week. In addition, most adults need muscle-strengthening exercises on 2 or more days a week.  Silver Sneakers may be a benefit available to you. To determine eligibility, you may visit the website: www.silversneakers.com or contact program at (318) 784-7313 Mon-Fri between 8AM-8PM.   . Maintain a healthy weight. The body mass index (BMI) is a screening tool to identify possible weight problems. It provides an estimate of body fat based on height and weight. Your health care provider can find your BMI and can help you achieve or maintain a healthy weight.   For adults 20 years and older: ? A BMI below 18.5 is considered underweight. ? A BMI of 18.5 to 24.9 is normal. ? A BMI of 25 to 29.9 is considered overweight. ? A BMI of 30 and above is considered obese.   . Maintain normal blood lipids and cholesterol levels by exercising and minimizing your intake of saturated fat. Eat a balanced diet with plenty of fruit and vegetables. Blood tests for lipids and cholesterol should begin at age 34 and be repeated every 5 years. If your lipid or cholesterol levels are high, you are over 50, or you are at high risk for heart disease, you may  need your cholesterol levels checked more frequently. Ongoing high lipid and cholesterol levels should be treated with medicines if diet and exercise are not working.  . If you smoke, find out from your health care provider how to quit. If you do not use tobacco, please do not start.  . If you choose to drink alcohol, please do not consume more than 2 drinks per day. One drink is considered to be 12 ounces (355 mL) of beer, 5 ounces (148 mL) of wine, or 1.5 ounces (44 mL) of liquor.  . If you are 65-43 years old, ask your health care provider if you should take aspirin to prevent  strokes.  . Use sunscreen. Apply sunscreen liberally and repeatedly throughout the day. You should seek shade when your shadow is shorter than you. Protect yourself by wearing long sleeves, pants, a wide-brimmed hat, and sunglasses year round, whenever you are outdoors.  . Once a month, do a whole body skin exam, using a mirror to look at the skin on your back. Tell your health care provider of new moles, moles that have irregular borders, moles that are larger than a pencil eraser, or moles that have changed in shape or color.

## 2016-12-26 ENCOUNTER — Other Ambulatory Visit: Payer: Self-pay

## 2016-12-26 MED ORDER — AZITHROMYCIN 250 MG PO TABS: ORAL_TABLET | ORAL | 0 refills | Status: DC

## 2017-01-03 ENCOUNTER — Ambulatory Visit (INDEPENDENT_AMBULATORY_CARE_PROVIDER_SITE_OTHER): Payer: Medicare Other | Admitting: General Surgery

## 2017-01-03 ENCOUNTER — Encounter: Payer: Self-pay | Admitting: General Surgery

## 2017-01-03 VITALS — BP 130/72 | HR 68 | Resp 12 | Ht 72.0 in | Wt 189.0 lb

## 2017-01-03 DIAGNOSIS — K4091 Unilateral inguinal hernia, without obstruction or gangrene, recurrent: Secondary | ICD-10-CM

## 2017-01-03 NOTE — Patient Instructions (Addendum)
The patient is aware to call back for any questions or concerns. May take 2 Aleve twice a day for groin pain. Heating pad to the area for comfort.  Send a MyChart message in one week with a status update.  Inguinal Hernia, Adult Introduction An inguinal hernia is when fat or the intestines push through the area where the leg meets the lower belly (groin) and make a rounded lump (bulge). This condition happens over time. There are three types of inguinal hernias. These types include:  Hernias that can be pushed back into the belly (are reducible).  Hernias that cannot be pushed back into the belly (are incarcerated).  Hernias that cannot be pushed back into the belly and lose their blood supply (get strangulated). This type needs emergency surgery. Follow these instructions at home: Lifestyle  Drink enough fluid to keep your urine (pee) clear or pale yellow.  Eat plenty of fruits, vegetables, and whole grains. These have a lot of fiber. Talk with your doctor if you have questions.  Avoid lifting heavy objects.  Avoid standing for long periods of time.  Do not use tobacco products. These include cigarettes, chewing tobacco, or e-cigarettes. If you need help quitting, ask your doctor.  Try to stay at a healthy weight. General instructions  Do not try to force the hernia back in.  Watch your hernia for any changes in color or size. Let your doctor know if there are any changes.  Take over-the-counter and prescription medicines only as told by your doctor.  Keep all follow-up visits as told by your doctor. This is important. Contact a doctor if:  You have a fever.  You have new symptoms.  Your symptoms get worse. Get help right away if:  The area where the legs meets the lower belly has:  Pain that gets worse suddenly.  A bulge that gets bigger suddenly and does not go down.  A bulge that turns red or purple.  A bulge that is painful to the touch.  You are a man and  your scrotum:  Suddenly feels painful.  Suddenly changes in size.  You feel sick to your stomach (nauseous) and this feeling does not go away.  You throw up (vomit) and this keeps happening.  You feel your heart beating a lot more quickly than normal.  You cannot poop (have a bowel movement) or pass gas. This information is not intended to replace advice given to you by your health care provider. Make sure you discuss any questions you have with your health care provider. Document Released: 01/11/2007 Document Revised: 05/18/2016 Document Reviewed: 10/21/2014  2017 Elsevier

## 2017-01-03 NOTE — Progress Notes (Signed)
Patient ID: Gary Mueller, male   DOB: 06-10-1941, 76 y.o.   MRN: [id number]  Chief Complaint  Patient presents with  . Hernia    HPI Gary Mueller is a 76 y.o. male.  Patient here today for an evaluation of a possible left inguinal hernia.  He states that he has noticed a knot about 6 weeks ago.  It does seem to be causing some lower abdominal Discomfort, but the thinks this is fairly minimal. He thinks he first noticied the area after helping get his wife up out of bed after she had surgery at [facility]. Scheduled to start chemoradiation next week. Patient continues to work nearly full-time at [employer]. .  No nausea, vomiting, constipation or diarrhea noted. He admits to IBS and states his bowel habits are unchanged.  HPI  Past Medical History:  Diagnosis Date  . Allergy   . Anxiety   . Cancer ([place]) 10/20/2016   basel cell  . Depression   . GERD (gastroesophageal reflux disease)   . Hyperlipidemia   . Hypertension   . IBS (irritable bowel syndrome)   . Thyroid disease     Past Surgical History:  Procedure Laterality Date  . CHOLECYSTECTOMY  1968  . COLONOSCOPY  2014  . HERNIA REPAIR Left 04/18/1996   inguinal hernia repair/ Dr Bary Castilla  . HERNIA REPAIR Right 02/26/1996   Dr Bary Castilla  . HERNIA REPAIR Left 08/07/2002   Dr Bary Castilla  . TOTAL KNEE ARTHROPLASTY Left 03/29/2015   [facility] Dr. Marry Guan    Family History  Problem Relation Age of Onset  . Heart disease Mother     A fib  . Healthy Sister     Social History Social History  Substance Use Topics  . Smoking status: Former Smoker    Types: Cigarettes    Quit date: 12/24/1964  . Smokeless tobacco: Never Used  . Alcohol use No     Comment: beer or wine occasionally    Allergies  Allergen Reactions  . Codeine     GI intolerance    Current Outpatient Prescriptions  Medication Sig Dispense Refill  . acyclovir (ZOVIRAX) 400 MG tablet Take by mouth. Reported on 04/12/2016    . ALPRAZolam (XANAX)  0.5 MG tablet Take 1 tablet (0.5 mg total) by mouth 2 (two) times daily as needed for anxiety. 60 tablet 5  . Ascorbic Acid (VITAMIN C) 1000 MG tablet Take by mouth.    Marland Kitchen aspirin 81 MG tablet Take by mouth.    . cholecalciferol (VITAMIN D) 1000 units tablet Take 1,000 Units by mouth daily.     . diclofenac sodium (VOLTAREN) 1 % GEL   0  . levothyroxine (SYNTHROID, LEVOTHROID) 75 MCG tablet Take 1 tablet (75 mcg total) by mouth daily. 30 tablet 12  . MULTIPLE VITAMIN PO Take by mouth.    Marland Kitchen omeprazole (PRILOSEC) 20 MG capsule Take 1 capsule (20 mg total) by mouth daily. (Patient taking differently: Take 20 mg by mouth as needed. ) 30 capsule 12  . ranitidine (ZANTAC) 150 MG tablet Take by mouth. Reported on 04/12/2016     No current facility-administered medications for this visit.     Review of Systems Review of Systems  Constitutional: Negative.   Respiratory: Negative.   Cardiovascular: Negative.   Gastrointestinal: Negative for constipation, diarrhea and nausea.    Blood pressure 130/72, pulse 68, resp. rate 12, height 6' (1.829 m), weight 189 lb (85.7 kg).  Physical Exam Physical Exam  Constitutional:  He is oriented to person, place, and time. He appears well-developed and well-nourished.  HENT:  Mouth/Throat: Oropharynx is clear and moist.  Eyes: Conjunctivae are normal. No scleral icterus.  Neck: Neck supple.  Cardiovascular: Normal rate, regular rhythm and normal heart sounds.   Pulmonary/Chest: Effort normal and breath sounds normal.  Abdominal: Soft. Normal appearance and bowel sounds are normal. A hernia is present. Hernia confirmed positive in the left inguinal area.  tender left inguinal area  Genitourinary:     Lymphadenopathy:    He has no cervical adenopathy.  Neurological: He is alert and oriented to person, place, and time.  Skin: Skin is warm and dry.  Psychiatric: His behavior is normal.    Data Reviewed Review of the 08/07/2002 operative note it which  time a recurrent left inguinal hernia repair showed both direct and indirect hernias. A large Prolene hernia system was placed at that time. The operative note describes a medial weakness thought to represent an early direct defect. Original repair had been completed in 1968.  Assessment    Recurrent left inguinal hernia with incarcerated omentum, minimally symptomatic.    Plan    The patient is primary caregiver for his wife who is recovering from cancer. He is having no difficulty with bowel or bladder function and the nonreducible mass is clinically and historically consistent with omentum.  In the attempt to postpone surgical intervention for allow him to care for his wife, he has been asked to make use of 2 Aleve tablets, twice a day and to give a MyChart report in one week regardng his progress. If he becomes essentially asymptomatic and regards to the discomfort present at the site, would plan from sedation until surgical repair is more appropriate. If the patient remains symptomatic, early surgical intervention would be appropriate.    Hernia precautions and incarceration were discussed with the patient. If they develop symptoms of an incarcerated hernia, they were encouraged to seek prompt medical attention.     This information has been scribed by Karie Fetch RN, BSN,BC.   Robert Bellow 01/05/2017, 7:40 PM

## 2017-01-04 DIAGNOSIS — K4091 Unilateral inguinal hernia, without obstruction or gangrene, recurrent: Secondary | ICD-10-CM | POA: Insufficient documentation

## 2017-01-16 ENCOUNTER — Encounter: Payer: Self-pay | Admitting: General Surgery

## 2017-01-17 ENCOUNTER — Encounter: Payer: Self-pay | Admitting: Physician Assistant

## 2017-01-17 ENCOUNTER — Ambulatory Visit (INDEPENDENT_AMBULATORY_CARE_PROVIDER_SITE_OTHER): Payer: Medicare Other | Admitting: Physician Assistant

## 2017-01-17 ENCOUNTER — Ambulatory Visit
Admission: RE | Admit: 2017-01-17 | Discharge: 2017-01-17 | Disposition: A | Discharge status: Home / Self Care | Payer: Medicare Other | Source: Ambulatory Visit | Attending: Physician Assistant | Admitting: Physician Assistant

## 2017-01-17 ENCOUNTER — Telehealth: Payer: Self-pay

## 2017-01-17 VITALS — BP 124/60 | HR 80 | Temp 98.6°F | Resp 16 | Wt 191.0 lb

## 2017-01-17 DIAGNOSIS — R0602 Shortness of breath: Secondary | ICD-10-CM | POA: Diagnosis not present

## 2017-01-17 DIAGNOSIS — R509 Fever, unspecified: Secondary | ICD-10-CM | POA: Insufficient documentation

## 2017-01-17 DIAGNOSIS — J101 Influenza due to other identified influenza virus with other respiratory manifestations: Secondary | ICD-10-CM | POA: Diagnosis not present

## 2017-01-17 DIAGNOSIS — I700 Atherosclerosis of aorta: Secondary | ICD-10-CM | POA: Insufficient documentation

## 2017-01-17 DIAGNOSIS — R05 Cough: Secondary | ICD-10-CM | POA: Diagnosis not present

## 2017-01-17 DIAGNOSIS — J019 Acute sinusitis, unspecified: Secondary | ICD-10-CM | POA: Diagnosis not present

## 2017-01-17 LAB — POCT INFLUENZA A/B
Influenza A, POC: NEGATIVE
Influenza B, POC: NEGATIVE

## 2017-01-17 MED ORDER — OSELTAMIVIR PHOSPHATE 75 MG PO CAPS: 75.0000 mg | ORAL_CAPSULE | Freq: Two times a day (BID) | ORAL | 0 refills | Status: AC

## 2017-01-17 MED ORDER — DOXYCYCLINE HYCLATE 100 MG PO TABS: 100.0000 mg | ORAL_TABLET | Freq: Two times a day (BID) | ORAL | 0 refills | Status: DC

## 2017-01-17 NOTE — Patient Instructions (Addendum)

## 2017-01-17 NOTE — Telephone Encounter (Signed)
-----   Message from Trinna Post, [place] sent at 01/17/2017 11:02 AM EST ----- No evidence of pneumonia. Have sent in Tamiflu to pharmacy instead of doxycycline.

## 2017-01-17 NOTE — Progress Notes (Addendum)
Patient: Gary Mueller Male    DOB: 02-04-1941   76 y.o.   MRN: [id number] Visit Date: 01/17/2017  Today's Provider: Trinna Post, PA-C   Chief Complaint  Patient presents with  . URI   Subjective:    URI   This is a new problem. The current episode started yesterday. The problem has been gradually worsening. The maximum temperature recorded prior to his arrival was 100.4 - 100.9 F. Associated symptoms include congestion, coughing, headaches, rhinorrhea, sneezing, a sore throat and wheezing. Pertinent negatives include no abdominal pain, chest pain, diarrhea, ear pain, joint pain, joint swelling, nausea, neck pain, plugged ear sensation, rash, sinus pain, swollen glands or vomiting. He has tried NSAIDs and decongestant for the symptoms. The treatment provided mild relief.   Patient is a 76 y/o man with history of pneumonia presenting today for cough, congestion, fever x 4 days. He reports coughing a couple of days ago and then feeling feverish yesterday. He is coughing some green colored sputum. Has sinus congestion and headache. No nausea or vomiting. Wife is currently undergoing chemotherapy for uterine cancer and he is concerned he has pneumonia and doesn't want to give it to her.    Allergies  Allergen Reactions  . Codeine     GI intolerance     Current Outpatient Prescriptions:  .  ALPRAZolam (XANAX) 0.5 MG tablet, Take 1 tablet (0.5 mg total) by mouth 2 (two) times daily as needed for anxiety., Disp: 60 tablet, Rfl: 5 .  Ascorbic Acid (VITAMIN C) 1000 MG tablet, Take by mouth., Disp: , Rfl:  .  aspirin 81 MG tablet, Take by mouth., Disp: , Rfl:  .  cholecalciferol (VITAMIN D) 1000 units tablet, Take 1,000 Units by mouth daily. , Disp: , Rfl:  .  levothyroxine (SYNTHROID, LEVOTHROID) 75 MCG tablet, Take 1 tablet (75 mcg total) by mouth daily., Disp: 30 tablet, Rfl: 12 .  MULTIPLE VITAMIN PO, Take by mouth., Disp: , Rfl:  .  omeprazole (PRILOSEC) 20 MG capsule, Take  1 capsule (20 mg total) by mouth daily. (Patient taking differently: Take 20 mg by mouth as needed. ), Disp: 30 capsule, Rfl: 12 .  ranitidine (ZANTAC) 150 MG tablet, Take by mouth. Reported on 04/12/2016, Disp: , Rfl:  .  acyclovir (ZOVIRAX) 400 MG tablet, Take by mouth. Reported on 04/12/2016, Disp: , Rfl:  .  diclofenac sodium (VOLTAREN) 1 % GEL, , Disp: , Rfl: 0 .  oseltamivir (TAMIFLU) 75 MG capsule, Take 1 capsule (75 mg total) by mouth 2 (two) times daily., Disp: 10 capsule, Rfl: 0  Review of Systems  Constitutional: Positive for chills, fatigue and fever (Pt reported having a fever last night off 100.9). Negative for activity change, appetite change, diaphoresis and unexpected weight change.  HENT: Positive for congestion, postnasal drip, rhinorrhea, sinus pressure, sneezing and sore throat. Negative for ear discharge, ear pain, nosebleeds, sinus pain and trouble swallowing.   Eyes: Negative.   Respiratory: Positive for cough, chest tightness, shortness of breath and wheezing. Negative for apnea, choking and stridor.   Cardiovascular: Negative for chest pain.  Gastrointestinal: Negative.  Negative for abdominal pain, diarrhea, nausea and vomiting.  Musculoskeletal: Negative for joint pain and neck pain.  Skin: Negative for rash.  Neurological: Positive for light-headedness and headaches. Negative for dizziness.    Social History  Substance Use Topics  . Smoking status: Former Smoker    Types: Cigarettes    Quit date: 12/24/1964  .  Smokeless tobacco: Never Used  . Alcohol use No     Comment: beer or wine occasionally   Objective:   BP 124/60 (BP Location: Left Arm, Patient Position: Sitting, Cuff Size: Normal)   Pulse 80   Temp 98.6 F (37 C) (Oral)   Resp 16   Wt 191 lb (86.6 kg)   SpO2 94%   BMI 25.90 kg/m   Physical Exam  Constitutional: He is oriented to person, place, and time. He appears well-developed and well-nourished. No distress.  HENT:  Right Ear: External  ear normal.  Left Ear: External ear normal.  Nose: Right sinus exhibits maxillary sinus tenderness and frontal sinus tenderness. Left sinus exhibits maxillary sinus tenderness and frontal sinus tenderness.  Mouth/Throat: Oropharynx is clear and moist and mucous membranes are normal. No oropharyngeal exudate, posterior oropharyngeal edema, posterior oropharyngeal erythema or tonsillar abscesses.  Eyes: Conjunctivae are normal.  Neck: Normal range of motion.  Cardiovascular: Normal rate and regular rhythm.   Pulmonary/Chest: Effort normal. He has wheezes.  Scant wheezing diffusely  Lymphadenopathy:    He has no cervical adenopathy.  Neurological: He is alert and oriented to person, place, and time.  Skin: Skin is warm and dry. He is not diaphoretic.  Psychiatric: He has a normal mood and affect. His behavior is normal.        Assessment & Plan:     1. Influenza A  Had originally thought patient had sinusitis, but rapid flu swab was positive after patient left clinic. Called mobile and left message. Called home and talked to wife who is on DPR and told her about prescription of Tamiflu for influenza.  - oseltamivir (TAMIFLU) 75 MG capsule; Take 1 capsule (75 mg total) by mouth 2 (two) times daily.  Dispense: 10 capsule; Refill: 0  2. Fever, unspecified fever cause  Rapid flu came back positive for Flu A. Sent in Tamiflu. CXR clear.  - POCT Influenza A/B - DG Chest 2 View; Future   Patient Instructions  Influenza, Adult Influenza ("the flu") is an infection in the lungs, nose, and throat (respiratory tract). It is caused by a virus. The flu causes many common cold symptoms, as well as a high fever and body aches. It can make you feel very sick. The flu spreads easily from person to person (is contagious). Getting a flu shot (influenza vaccination) every year is the best way to prevent the flu. Follow these instructions at home:  Take over-the-counter and prescription medicines only  as told by your doctor.  Use a cool mist humidifier to add moisture (humidity) to the air in your home. This can make it easier to breathe.  Rest as needed.  Drink enough fluid to keep your pee (urine) clear or pale yellow.  Cover your mouth and nose when you cough or sneeze.  Wash your hands with soap and water often, especially after you cough or sneeze. If you cannot use soap and water, use hand sanitizer.  Stay home from work or school as told by your doctor. Unless you are visiting your doctor, try to avoid leaving home until your fever has been gone for 24 hours without the use of medicine.  Keep all follow-up visits as told by your doctor. This is important. How is this prevented?  Getting a yearly (annual) flu shot is the best way to avoid getting the flu. You may get the flu shot in late summer, fall, or winter. Ask your doctor when you should get your flu  shot.  Wash your hands often or use hand sanitizer often.  Avoid contact with people who are sick during cold and flu season.  Eat healthy foods.  Drink plenty of fluids.  Get enough sleep.  Exercise regularly. Contact a doctor if:  You get new symptoms.  You have:  Chest pain.  Watery poop (diarrhea).  A fever.  Your cough gets worse.  You start to have more mucus.  You feel sick to your stomach (nauseous).  You throw up (vomit). Get help right away if:  You start to be short of breath or have trouble breathing.  Your skin or nails turn a bluish color.  You have very bad pain or stiffness in your neck.  You get a sudden headache.  You get sudden pain in your face or ear.  You cannot stop throwing up. This information is not intended to replace advice given to you by your health care provider. Make sure you discuss any questions you have with your health care provider. Document Released: 09/19/2008 Document Revised: 05/18/2016 Document Reviewed: 10/05/2015 Elsevier Interactive Patient  Education  2017 [employer].   The entirety of the information documented in the History of Present Illness, Review of Systems and Physical Exam were personally obtained by me. Portions of this information were initially documented by Ashley Royalty, CMA and reviewed by me for thoroughness and accuracy.   Return if symptoms worsen or fail to improve.       Trinna Post, PA-C  [facility]

## 2017-01-17 NOTE — Telephone Encounter (Signed)
LMTCB 01/17/2017  Thanks,   -Mickel Baas

## 2017-01-17 NOTE — Addendum Note (Signed)
Addended by: Trinna Post on: 01/17/2017 11:16 AM   Modules accepted: Orders

## 2017-01-18 NOTE — Telephone Encounter (Signed)
Patient advised as below. Patient verbalizes understanding and is in agreement with treatment plan.  

## 2017-02-02 DIAGNOSIS — M71342 Other bursal cyst, left hand: Secondary | ICD-10-CM | POA: Diagnosis not present

## 2017-02-02 DIAGNOSIS — Z08 Encounter for follow-up examination after completed treatment for malignant neoplasm: Secondary | ICD-10-CM | POA: Diagnosis not present

## 2017-02-02 DIAGNOSIS — Z85828 Personal history of other malignant neoplasm of skin: Secondary | ICD-10-CM | POA: Diagnosis not present

## 2017-03-01 ENCOUNTER — Other Ambulatory Visit: Payer: Self-pay | Admitting: Family Medicine

## 2017-03-01 MED ORDER — AMOXICILLIN-POT CLAVULANATE 875-125 MG PO TABS: 1.0000 | ORAL_TABLET | Freq: Two times a day (BID) | ORAL | 0 refills | Status: DC

## 2017-04-02 ENCOUNTER — Encounter: Payer: Self-pay | Admitting: Family Medicine

## 2017-04-02 ENCOUNTER — Ambulatory Visit (INDEPENDENT_AMBULATORY_CARE_PROVIDER_SITE_OTHER): Payer: Medicare Other | Admitting: Family Medicine

## 2017-04-02 VITALS — BP 128/72 | HR 60 | Temp 97.7°F | Resp 16 | Wt 189.0 lb

## 2017-04-02 DIAGNOSIS — R059 Cough, unspecified: Secondary | ICD-10-CM

## 2017-04-02 DIAGNOSIS — R05 Cough: Secondary | ICD-10-CM | POA: Diagnosis not present

## 2017-04-02 DIAGNOSIS — J0100 Acute maxillary sinusitis, unspecified: Secondary | ICD-10-CM

## 2017-04-02 MED ORDER — DOXYCYCLINE HYCLATE 100 MG PO TABS: 100.0000 mg | ORAL_TABLET | Freq: Two times a day (BID) | ORAL | 0 refills | Status: DC

## 2017-04-02 MED ORDER — HYDROCOD POLST-CPM POLST ER 10-8 MG/5ML PO SUER: 5.0000 mL | Freq: Two times a day (BID) | ORAL | 0 refills | Status: DC | PRN

## 2017-04-02 NOTE — Progress Notes (Signed)
Patient: Gary Mueller Male    DOB: [birth year]   76 y.o.   MRN: [id number] Visit Date: 04/02/2017  Today's Provider: Wilhemena Durie, MD   Chief Complaint  Patient presents with  . Cough   Subjective:    Cough  This is a recurrent problem. The current episode started more than 1 month ago (saw [place] on 01/17/2017, and was treated for influenza A. Was treated with Tamiflu. CXR was negative for pneumonia. Pt reports his improved for about 1 week after being treated, but then sx returned.). The problem has been unchanged. The cough is productive of sputum (green sputum). Associated symptoms include chest pain (pt reports he has chest pain with deep breaths. Pt also concerned because his heart skips a beat.  Pt is F/B cardiology.), headaches, nasal congestion, postnasal drip, a sore throat and shortness of breath (on exertion). Pertinent negatives include no chills, ear congestion, ear pain, fever, heartburn, hemoptysis, myalgias, rhinorrhea, sweats, weight loss or wheezing. Treatments tried: Tamiflu. The treatment provided mild relief. His past medical history is significant for pneumonia. There is no history of environmental allergies.       Allergies  Allergen Reactions  . Codeine     GI intolerance; hallucinations      Current Outpatient Prescriptions:  .  Ascorbic Acid (VITAMIN C) 1000 MG tablet, Take by mouth., Disp: , Rfl:  .  aspirin 81 MG tablet, Take by mouth., Disp: , Rfl:  .  levothyroxine (SYNTHROID, LEVOTHROID) 75 MCG tablet, Take 1 tablet (75 mcg total) by mouth daily., Disp: 30 tablet, Rfl: 12 .  MULTIPLE VITAMIN PO, Take by mouth., Disp: , Rfl:  .  acyclovir (ZOVIRAX) 400 MG tablet, Take by mouth. Reported on 04/12/2016, Disp: , Rfl:  .  ALPRAZolam (XANAX) 0.5 MG tablet, Take 1 tablet (0.5 mg total) by mouth 2 (two) times daily as needed for anxiety. (Patient not taking: Reported on 04/02/2017), Disp: 60 tablet, Rfl: 5 .  chlorpheniramine-HYDROcodone (TUSSIONEX  PENNKINETIC ER) 10-8 MG/5ML SUER, Take 5 mLs by mouth every 12 (twelve) hours as needed for cough., Disp: 140 mL, Rfl: 0 .  cholecalciferol (VITAMIN D) 1000 units tablet, Take 1,000 Units by mouth daily. , Disp: , Rfl:  .  diclofenac sodium (VOLTAREN) 1 % GEL, , Disp: , Rfl: 0 .  doxycycline (VIBRA-TABS) 100 MG tablet, Take 1 tablet (100 mg total) by mouth 2 (two) times daily., Disp: 20 tablet, Rfl: 0 .  omeprazole (PRILOSEC) 20 MG capsule, Take 1 capsule (20 mg total) by mouth daily. (Patient not taking: Reported on 04/02/2017), Disp: 30 capsule, Rfl: 12 .  ranitidine (ZANTAC) 150 MG tablet, Take by mouth. Reported on 04/12/2016, Disp: , Rfl:   Review of Systems  Constitutional: Positive for fatigue. Negative for chills, fever and weight loss.  HENT: Positive for postnasal drip and sore throat. Negative for ear pain and rhinorrhea.   Respiratory: Positive for cough and shortness of breath (on exertion). Negative for hemoptysis and wheezing.   Cardiovascular: Positive for chest pain (pt reports he has chest pain with deep breaths. Pt also concerned because his heart skips a beat.  Pt is F/B cardiology.).  Gastrointestinal: Negative for heartburn.  Musculoskeletal: Negative for myalgias.  Allergic/Immunologic: Negative for environmental allergies.  Neurological: Positive for headaches.    Social History  Substance Use Topics  . Smoking status: Former Smoker    Types: Cigarettes    Quit date: 12/24/1964  . Smokeless tobacco: Never Used  .  Alcohol use No     Comment: beer or wine occasionally   Objective:   BP 128/72 (BP Location: Left Arm, Patient Position: Sitting, Cuff Size: Normal)   Pulse 60   Temp 97.7 F (36.5 C) (Oral)   Resp 16   Wt 189 lb (85.7 kg)   SpO2 97%   BMI 25.63 kg/m  Vitals:   04/02/17 1037  BP: 128/72  Pulse: 60  Resp: 16  Temp: 97.7 F (36.5 C)  TempSrc: Oral  SpO2: 97%  Weight: 189 lb (85.7 kg)     Physical Exam  Constitutional: He appears  well-developed and well-nourished.  HENT:  Head: Normocephalic.  Right Ear: Tympanic membrane normal.  Left Ear: Tympanic membrane normal.  Nose: Right sinus exhibits maxillary sinus tenderness. Left sinus exhibits maxillary sinus tenderness.  Mouth/Throat: Oropharynx is clear and moist.  Eyes: Pupils are equal, round, and reactive to light. Right eye exhibits no discharge. Left eye exhibits no discharge.  Neck: Normal range of motion. Neck supple. No tracheal deviation present. No thyromegaly present.  Cardiovascular: Normal rate, regular rhythm and normal heart sounds.   Pulmonary/Chest: Effort normal and breath sounds normal. No respiratory distress.  Lymphadenopathy:    He has no cervical adenopathy.  Psychiatric: He has a normal mood and affect. His behavior is normal.        Assessment & Plan:     1. Acute maxillary sinusitis, recurrence not specified Will treat with Doxy as below. Call if sx worsen or do not improve. - doxycycline (VIBRA-TABS) 100 MG tablet; Take 1 tablet (100 mg total) by mouth 2 (two) times daily.  Dispense: 20 tablet; Refill: 0  2. Cough Will prescribe Tussionex for sleep. 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 Benefits outweighs risk. - chlorpheniramine-HYDROcodone (TUSSIONEX PENNKINETIC ER) 10-8 MG/5ML SUER; Take 5 mLs by mouth every 12 (twelve) hours as needed for cough.  Dispense: 140 mL; Refill: 0     Patient seen and examined by Miguel Aschoff, MD, and note scribed by Renaldo Fiddler, CMA. I have done the exam and reviewed the above chart and it is accurate to the best of my knowledge. Development worker, community has been used in this note in any air is in the dictation or transcription are unintentional.  Wilhemena Durie, MD  [place]

## 2017-04-10 DIAGNOSIS — M79671 Pain in right foot: Secondary | ICD-10-CM | POA: Diagnosis not present

## 2017-04-10 DIAGNOSIS — M79642 Pain in left hand: Secondary | ICD-10-CM | POA: Diagnosis not present

## 2017-04-14 ENCOUNTER — Other Ambulatory Visit: Payer: Self-pay | Admitting: Family Medicine

## 2017-04-14 DIAGNOSIS — K279 Peptic ulcer, site unspecified, unspecified as acute or chronic, without hemorrhage or perforation: Secondary | ICD-10-CM

## 2017-04-14 DIAGNOSIS — K2970 Gastritis, unspecified, without bleeding: Secondary | ICD-10-CM

## 2017-05-09 DIAGNOSIS — M79645 Pain in left finger(s): Secondary | ICD-10-CM | POA: Diagnosis not present

## 2017-05-09 DIAGNOSIS — M67449 Ganglion, unspecified hand: Secondary | ICD-10-CM | POA: Diagnosis not present

## 2017-05-09 DIAGNOSIS — M19042 Primary osteoarthritis, left hand: Secondary | ICD-10-CM | POA: Diagnosis not present

## 2017-05-14 DIAGNOSIS — M9903 Segmental and somatic dysfunction of lumbar region: Secondary | ICD-10-CM | POA: Diagnosis not present

## 2017-05-14 DIAGNOSIS — M5136 Other intervertebral disc degeneration, lumbar region: Secondary | ICD-10-CM | POA: Diagnosis not present

## 2017-05-18 DIAGNOSIS — I493 Ventricular premature depolarization: Secondary | ICD-10-CM | POA: Diagnosis not present

## 2017-05-18 DIAGNOSIS — I2510 Atherosclerotic heart disease of native coronary artery without angina pectoris: Secondary | ICD-10-CM | POA: Diagnosis not present

## 2017-05-30 DIAGNOSIS — I2510 Atherosclerotic heart disease of native coronary artery without angina pectoris: Secondary | ICD-10-CM | POA: Diagnosis not present

## 2017-08-22 DIAGNOSIS — R0602 Shortness of breath: Secondary | ICD-10-CM | POA: Diagnosis not present

## 2017-08-22 DIAGNOSIS — R079 Chest pain, unspecified: Secondary | ICD-10-CM | POA: Diagnosis not present

## 2017-08-22 DIAGNOSIS — R002 Palpitations: Secondary | ICD-10-CM | POA: Diagnosis not present

## 2017-08-22 DIAGNOSIS — I493 Ventricular premature depolarization: Secondary | ICD-10-CM | POA: Diagnosis not present

## 2017-08-22 DIAGNOSIS — I2510 Atherosclerotic heart disease of native coronary artery without angina pectoris: Secondary | ICD-10-CM | POA: Diagnosis not present

## 2017-08-24 DIAGNOSIS — I493 Ventricular premature depolarization: Secondary | ICD-10-CM | POA: Diagnosis not present

## 2017-09-02 IMAGING — CR DG CHEST 2V
1 series · 2 of 2 positions shown · non-contrast
Comparison: PA and lateral chest x-ray February 16, 2014

CLINICAL DATA: Onset of cough and fever and shortness of breath
with pleuritic chest discomfort on the left since yesterday morning.
History of previous episodes of pneumonia, coronary artery disease,
atrial fibrillation, remote history of smoking.

EXAM:
CHEST  2 VIEW

[Series 1: dg chest 2 view · 0.14mm/px · 2 of 2 slices shown]
[im 1/2]
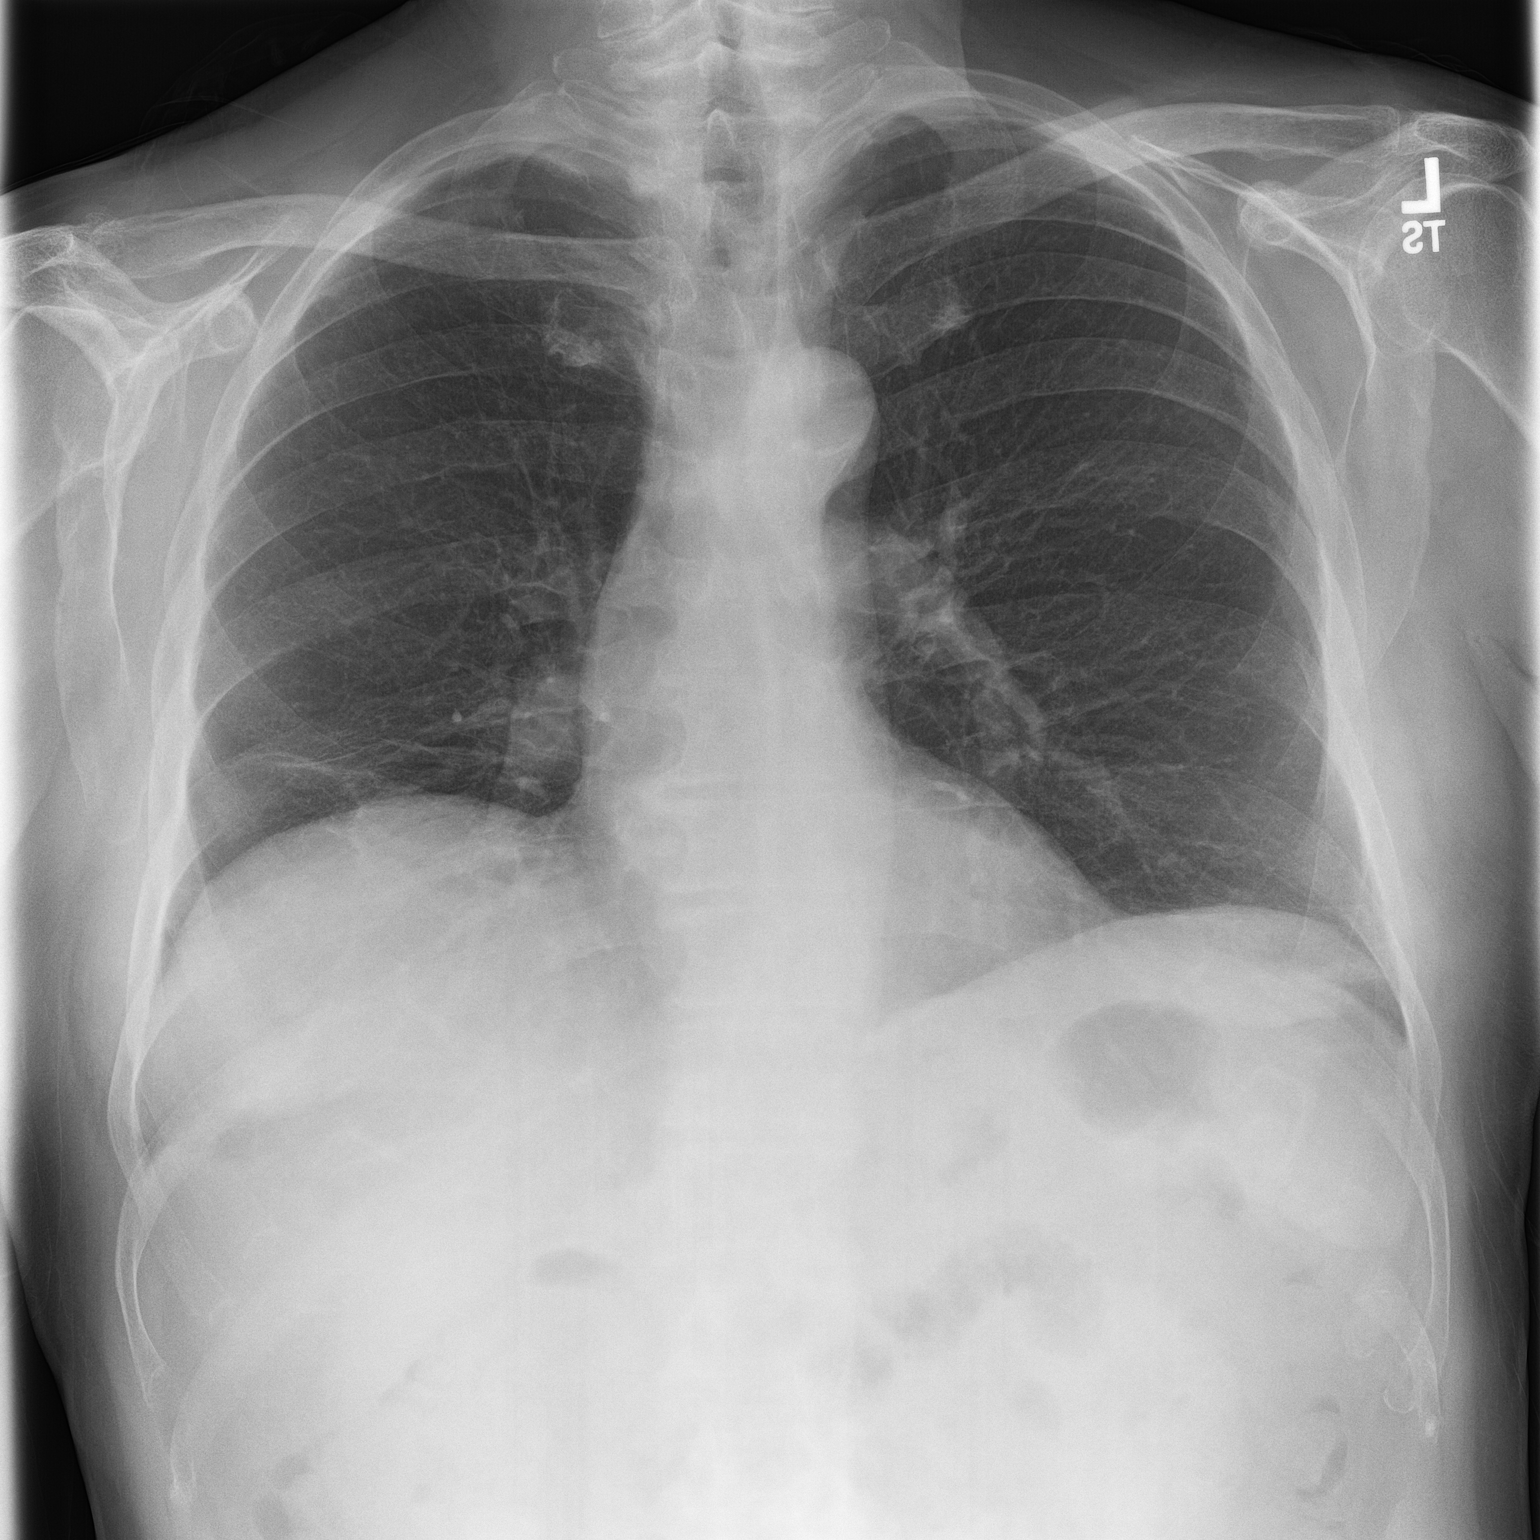
[im 2/2]
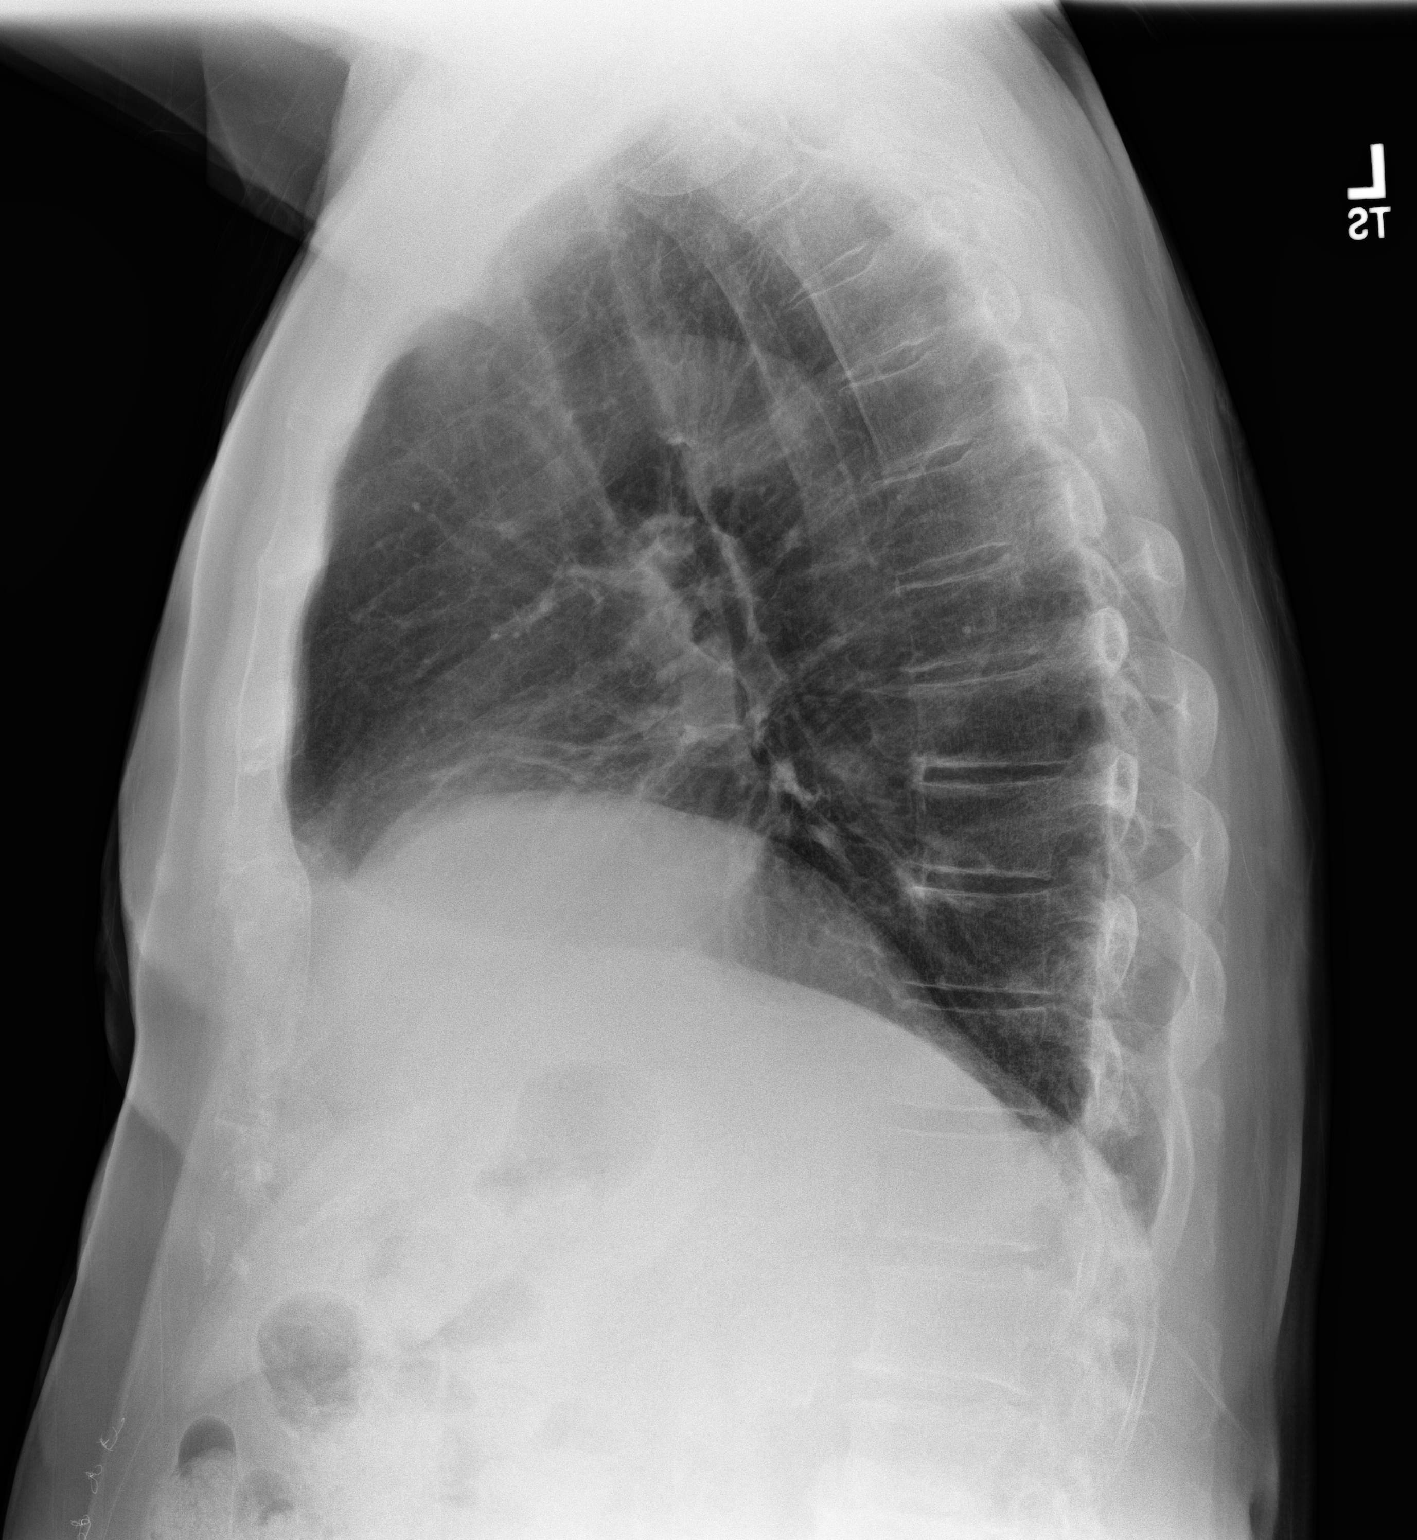

[2 of 2 positions shown; findings below may reference images not displayed]

FINDINGS: The lungs are mildly hypoinflated. There is stable scarring at the
lung bases. The heart and pulmonary vascularity are normal. There is
calcification in the wall of the thoracic aorta. The mediastinum is
normal in width. There is no pleural effusion. The bony thorax
exhibits no acute abnormality.
IMPRESSION: There is no pneumonia nor other acute cardiopulmonary abnormality.

Thoracic aortic atherosclerosis.

## 2017-09-03 DIAGNOSIS — I2510 Atherosclerotic heart disease of native coronary artery without angina pectoris: Secondary | ICD-10-CM | POA: Diagnosis not present

## 2017-09-03 DIAGNOSIS — R0602 Shortness of breath: Secondary | ICD-10-CM | POA: Diagnosis not present

## 2017-09-03 DIAGNOSIS — I493 Ventricular premature depolarization: Secondary | ICD-10-CM | POA: Diagnosis not present

## 2017-09-03 DIAGNOSIS — Z96652 Presence of left artificial knee joint: Secondary | ICD-10-CM | POA: Diagnosis not present

## 2017-09-03 DIAGNOSIS — R079 Chest pain, unspecified: Secondary | ICD-10-CM | POA: Diagnosis not present

## 2017-09-06 DIAGNOSIS — M25561 Pain in right knee: Secondary | ICD-10-CM | POA: Diagnosis not present

## 2017-09-06 DIAGNOSIS — M1711 Unilateral primary osteoarthritis, right knee: Secondary | ICD-10-CM | POA: Diagnosis not present

## 2017-09-22 ENCOUNTER — Other Ambulatory Visit: Payer: Self-pay | Admitting: Family Medicine

## 2017-10-08 ENCOUNTER — Encounter: Payer: Self-pay | Admitting: Family Medicine

## 2017-10-09 ENCOUNTER — Other Ambulatory Visit: Payer: Self-pay

## 2017-10-09 DIAGNOSIS — K279 Peptic ulcer, site unspecified, unspecified as acute or chronic, without hemorrhage or perforation: Secondary | ICD-10-CM

## 2017-10-09 DIAGNOSIS — K2970 Gastritis, unspecified, without bleeding: Secondary | ICD-10-CM

## 2017-10-09 MED ORDER — OMEPRAZOLE 20 MG PO CPDR: 20.0000 mg | DELAYED_RELEASE_CAPSULE | Freq: Every day | ORAL | 3 refills | Status: DC

## 2017-10-31 DIAGNOSIS — Z23 Encounter for immunization: Secondary | ICD-10-CM | POA: Diagnosis not present

## 2017-11-22 DIAGNOSIS — M2391 Unspecified internal derangement of right knee: Secondary | ICD-10-CM | POA: Diagnosis not present

## 2017-11-22 DIAGNOSIS — M25561 Pain in right knee: Secondary | ICD-10-CM | POA: Diagnosis not present

## 2017-11-29 DIAGNOSIS — M2391 Unspecified internal derangement of right knee: Secondary | ICD-10-CM | POA: Diagnosis not present

## 2017-11-29 DIAGNOSIS — M1711 Unilateral primary osteoarthritis, right knee: Secondary | ICD-10-CM | POA: Diagnosis not present

## 2017-12-25 DIAGNOSIS — Z9841 Cataract extraction status, right eye: Secondary | ICD-10-CM

## 2017-12-25 HISTORY — DX: Cataract extraction status, right eye: Z98.41

## 2017-12-26 ENCOUNTER — Encounter
Admission: RE | Admit: 2017-12-26 | Discharge: 2017-12-26 | Disposition: A | Discharge status: Home / Self Care | Payer: Medicare Other | Source: Ambulatory Visit | Attending: Orthopedic Surgery | Admitting: Orthopedic Surgery

## 2017-12-26 ENCOUNTER — Other Ambulatory Visit: Payer: Self-pay

## 2017-12-26 DIAGNOSIS — Z01812 Encounter for preprocedural laboratory examination: Secondary | ICD-10-CM | POA: Diagnosis not present

## 2017-12-26 DIAGNOSIS — I4510 Unspecified right bundle-branch block: Secondary | ICD-10-CM | POA: Insufficient documentation

## 2017-12-26 DIAGNOSIS — R001 Bradycardia, unspecified: Secondary | ICD-10-CM | POA: Diagnosis not present

## 2017-12-26 DIAGNOSIS — Z01810 Encounter for preprocedural cardiovascular examination: Secondary | ICD-10-CM | POA: Diagnosis not present

## 2017-12-26 DIAGNOSIS — Z01818 Encounter for other preprocedural examination: Secondary | ICD-10-CM | POA: Insufficient documentation

## 2017-12-26 HISTORY — DX: Hypothyroidism, unspecified: E03.9

## 2017-12-26 HISTORY — DX: Unspecified hearing loss, unspecified ear: H91.90

## 2017-12-26 HISTORY — DX: Unspecified osteoarthritis, unspecified site: M19.90

## 2017-12-26 HISTORY — DX: Tinnitus, bilateral: H93.13

## 2017-12-26 LAB — CBC
HCT: 44.6 % (ref 40.0–52.0)
HEMOGLOBIN: 15.1 g/dL (ref 13.0–18.0)
MCH: 32.4 pg (ref 26.0–34.0)
MCHC: 33.9 g/dL (ref 32.0–36.0)
MCV: 95.5 fL (ref 80.0–100.0)
PLATELETS: 196 10*3/uL (ref 150–440)
RBC: 4.67 MIL/uL (ref 4.40–5.90)
RDW: 13.6 % (ref 11.5–14.5)
WBC: 7 10*3/uL (ref 3.8–10.6)

## 2017-12-26 NOTE — Patient Instructions (Addendum)
Your procedure is scheduled on: January 02, 2018 (Wednesday )Report to Same Day Surgery 2nd floor medical mall [facility] Entrance-take elevator on left to 2nd floor.  Check in with surgery information desk.) To find out your arrival time please call [phone] between 1PM - 3PM on January 01, 2018 (Tuesday ) Remember: Instructions that are not followed completely may result in serious medical risk, up to and including death, or upon the discretion of your surgeon and anesthesiologist your surgery may need to be rescheduled.    _x___ 1. Do not eat food after midnight the night before your procedure. You may drink clear liquids up to 2 hours before you are scheduled to arrive at the hospital for your procedure.  Do not drink clear liquids within 2 hours of your scheduled arrival to the hospital.  Clear liquids include  --Water or Apple juice without pulp  --Clear carbohydrate beverage such as ClearFast or Gatorade  --Black Coffee or Clear Tea (No milk, no creamers, do not add anything to coffee or tea             Type 1 and type 2 diabetics should only drink water.  No gum chewing or hard candies.     __x__ 2. No Alcohol for 24 hours before or after surgery.   __x__3. No Smoking for 24 prior to surgery.   ____  4. Bring all medications with you on the day of surgery if instructed.    __x__ 5. Notify your doctor if there is any change in your medical condition     (cold, fever, infections).     Do not wear jewelry, make-up, hairpins, clips or nail polish.  Do not wear lotions, powders, or perfumes.  Do not shave 48 hours prior to surgery. Men may shave face and neck.  Do not bring valuables to the hospital.    [facility] is not responsible for any belongings or valuables.               Contacts, dentures or bridgework may not be worn into surgery.  Leave your suitcase in the car. After surgery it may be brought to your room.  For patients admitted to the hospital, discharge time is  determined by your treatment team                        Patients discharged the day of surgery will not be allowed to drive home.  You will need someone to drive you home and stay with you the night of your procedure.    Please read over the following fact sheets that you were given:   [facility] Preparing for Surgery and or MRSA Information   _x___ Take anti-hypertensive listed below, cardiac, seizure, asthma,     anti-reflux and psychiatric medicines with a sip of water. These include:  1. OMEPRAZOLE  2. OMEPRAZOLE AT BEDTIME ON TUESDAY NIGHT  3.  4.  5.  6.  ____Fleets enema or Magnesium Citrate as directed.   _x___ Use CHG Soap or sage wipes as directed on instruction sheet   ____ Use inhalers on the day of surgery and bring to hospital day of surgery  ____ Stop Metformin and Janumet 2 days prior to surgery.    ____ Take 1/2 of usual insulin dose the night before surgery and none on the morning     surgery.   _x___ Follow recommendations from Cardiologist, Pulmonologist or PCP regarding  stopping Aspirin, Coumadin, Plavix ,Eliquis, Effient, or Pradaxa, and Pletal. (STOP ASPIRIN TODAY )  X____Stop Anti-inflammatories such as Advil, Aleve, Ibuprofen, Motrin, Naproxen, Naprosyn, Goodies powders or aspirin products. OK to take Tylenol                           _x___ Stop supplements until after surgery.  But may continue Vitamin D, Vitamin B, and multivitamin       ____ Bring C-Pap to the hospital.

## 2017-12-30 NOTE — Discharge Instructions (Signed)
°  Instructions after Knee Arthroscopy  ° ° Amerigo Mcglory P. Goldman Birchall, [name], M.D.    ° Dept. of Orthopaedics & Sports Medicine ° [facility] ° [address] ° [place], [place]  [address] ° ° Phone: [phone]   Fax: [phone] ° ° °DIET: °• Drink plenty of non-alcoholic fluids & begin a light diet. °• Resume your normal diet the day after surgery. ° °ACTIVITY:  °• You may use crutches or a walker with weight-bearing as tolerated, unless instructed otherwise. °• You may wean yourself off of the walker or crutches as tolerated.  °• Begin doing gentle exercises. Exercising will reduce the pain and swelling, increase motion, and prevent muscle weakness.   °• Avoid strenuous activities or athletics for a minimum of 4-6 weeks after arthroscopic surgery. °• Do not drive or operate any equipment until instructed. ° °WOUND CARE:  °• Place one to two pillows under the knee the first day or two when sitting or lying.  °• Continue to use the ice packs periodically to reduce pain and swelling. °• The small incisions in your knee are closed with nylon stitches. The stitches will be removed in the office. °• The bulky dressing may be removed on the second day after surgery. DO NOT TOUCH THE STITCHES. Put a Band-Aid over each stitch. Do NOT use any ointments or creams on the incisions.  °• You may bathe or shower after the stitches are removed at the first office visit following surgery. ° °MEDICATIONS: °• You may resume your regular medications. °• Please take the pain medication as prescribed. °• Do not take pain medication on an empty stomach. °• Do not drive or drink alcoholic beverages when taking pain medications. ° °CALL THE OFFICE FOR: °• Temperature above 101 degrees °• Excessive bleeding or drainage on the dressing. °• Excessive swelling, coldness, or paleness of the toes. °• Persistent nausea and vomiting. ° °FOLLOW-UP:  °• You should have an appointment to return to the office in 7-10 days after surgery.  °  °

## 2017-12-31 MED ORDER — PROPOFOL 10 MG/ML IV BOLUS
INTRAVENOUS | Status: AC
Start: 2017-12-31 — End: ?
  Filled 2017-12-31: qty 20

## 2018-01-01 MED ORDER — CEFAZOLIN SODIUM-DEXTROSE 2-4 GM/100ML-% IV SOLN
2.0000 g | INTRAVENOUS | Status: AC
  Administered 2018-01-02: 2 g via INTRAVENOUS

## 2018-01-02 ENCOUNTER — Ambulatory Visit: Payer: Medicare Other | Admitting: Anesthesiology

## 2018-01-02 ENCOUNTER — Encounter
Admission: RE | Disposition: A | Discharge status: Home / Self Care | Payer: Self-pay | Source: Ambulatory Visit | Attending: Orthopedic Surgery

## 2018-01-02 ENCOUNTER — Ambulatory Visit
Admission: RE | Admit: 2018-01-02 | Discharge: 2018-01-02 | Disposition: A | Discharge status: Home / Self Care | Payer: Medicare Other | Source: Ambulatory Visit | Attending: Orthopedic Surgery | Admitting: Orthopedic Surgery

## 2018-01-02 DIAGNOSIS — M94261 Chondromalacia, right knee: Secondary | ICD-10-CM | POA: Insufficient documentation

## 2018-01-02 DIAGNOSIS — Z885 Allergy status to narcotic agent status: Secondary | ICD-10-CM | POA: Insufficient documentation

## 2018-01-02 DIAGNOSIS — M23221 Derangement of posterior horn of medial meniscus due to old tear or injury, right knee: Secondary | ICD-10-CM | POA: Insufficient documentation

## 2018-01-02 DIAGNOSIS — M2391 Unspecified internal derangement of right knee: Secondary | ICD-10-CM | POA: Diagnosis not present

## 2018-01-02 DIAGNOSIS — Z96652 Presence of left artificial knee joint: Secondary | ICD-10-CM | POA: Diagnosis not present

## 2018-01-02 DIAGNOSIS — I493 Ventricular premature depolarization: Secondary | ICD-10-CM | POA: Insufficient documentation

## 2018-01-02 DIAGNOSIS — Z8711 Personal history of peptic ulcer disease: Secondary | ICD-10-CM | POA: Insufficient documentation

## 2018-01-02 DIAGNOSIS — Z7982 Long term (current) use of aspirin: Secondary | ICD-10-CM | POA: Diagnosis not present

## 2018-01-02 DIAGNOSIS — Z87891 Personal history of nicotine dependence: Secondary | ICD-10-CM | POA: Diagnosis not present

## 2018-01-02 DIAGNOSIS — E039 Hypothyroidism, unspecified: Secondary | ICD-10-CM | POA: Diagnosis not present

## 2018-01-02 DIAGNOSIS — M238X1 Other internal derangements of right knee: Secondary | ICD-10-CM | POA: Diagnosis present

## 2018-01-02 DIAGNOSIS — Z79899 Other long term (current) drug therapy: Secondary | ICD-10-CM | POA: Insufficient documentation

## 2018-01-02 DIAGNOSIS — I2510 Atherosclerotic heart disease of native coronary artery without angina pectoris: Secondary | ICD-10-CM | POA: Diagnosis not present

## 2018-01-02 DIAGNOSIS — I10 Essential (primary) hypertension: Secondary | ICD-10-CM | POA: Diagnosis not present

## 2018-01-02 DIAGNOSIS — F419 Anxiety disorder, unspecified: Secondary | ICD-10-CM | POA: Insufficient documentation

## 2018-01-02 DIAGNOSIS — M1711 Unilateral primary osteoarthritis, right knee: Secondary | ICD-10-CM | POA: Insufficient documentation

## 2018-01-02 DIAGNOSIS — E785 Hyperlipidemia, unspecified: Secondary | ICD-10-CM | POA: Diagnosis not present

## 2018-01-02 HISTORY — PX: CHONDROPLASTY: SHX5177

## 2018-01-02 HISTORY — PX: KNEE ARTHROSCOPY: SHX127

## 2018-01-02 HISTORY — PX: KNEE ARTHROSCOPY WITH MEDIAL MENISECTOMY: SHX5651

## 2018-01-02 SURGERY — ARTHROSCOPY, KNEE
Anesthesia: General | Laterality: Right

## 2018-01-02 MED ORDER — MORPHINE SULFATE (PF) 4 MG/ML IV SOLN
INTRAVENOUS | Status: AC
Start: 2018-01-02 — End: ?
  Filled 2018-01-02: qty 1

## 2018-01-02 MED ORDER — LACTATED RINGERS IV SOLN
INTRAVENOUS | Status: DC
  Administered 2018-01-02: 14:00:00 via INTRAVENOUS

## 2018-01-02 MED ORDER — GLYCOPYRROLATE 0.2 MG/ML IJ SOLN
INTRAMUSCULAR | Status: AC
  Filled 2018-01-02: qty 1

## 2018-01-02 MED ORDER — PROPOFOL 10 MG/ML IV BOLUS
INTRAVENOUS | Status: AC
  Filled 2018-01-02: qty 20

## 2018-01-02 MED ORDER — FENTANYL CITRATE (PF) 100 MCG/2ML IJ SOLN
INTRAMUSCULAR | Status: AC
  Filled 2018-01-02: qty 2

## 2018-01-02 MED ORDER — MIDAZOLAM HCL 2 MG/2ML IJ SOLN
INTRAMUSCULAR | Status: AC
  Filled 2018-01-02: qty 2

## 2018-01-02 MED ORDER — CEFAZOLIN SODIUM-DEXTROSE 2-4 GM/100ML-% IV SOLN
INTRAVENOUS | Status: AC
  Filled 2018-01-02: qty 100

## 2018-01-02 MED ORDER — DEXAMETHASONE SODIUM PHOSPHATE 10 MG/ML IJ SOLN
INTRAMUSCULAR | Status: AC
  Filled 2018-01-02: qty 1

## 2018-01-02 MED ORDER — LIDOCAINE HCL (CARDIAC) 20 MG/ML IV SOLN
INTRAVENOUS | Status: DC | PRN
  Administered 2018-01-02: 60 mg via INTRAVENOUS

## 2018-01-02 MED ORDER — FENTANYL CITRATE (PF) 100 MCG/2ML IJ SOLN: 25.0000 ug | INTRAMUSCULAR | Status: DC | PRN

## 2018-01-02 MED ORDER — BUPIVACAINE-EPINEPHRINE (PF) 0.25% -1:200000 IJ SOLN
INTRAMUSCULAR | Status: AC
  Filled 2018-01-02: qty 30

## 2018-01-02 MED ORDER — ONDANSETRON HCL 4 MG/2ML IJ SOLN
INTRAMUSCULAR | Status: DC | PRN
  Administered 2018-01-02: 4 mg via INTRAVENOUS

## 2018-01-02 MED ORDER — MORPHINE SULFATE 4 MG/ML IJ SOLN
INTRAMUSCULAR | Status: DC | PRN
  Administered 2018-01-02: 4 mg via SUBCUTANEOUS

## 2018-01-02 MED ORDER — ACETAMINOPHEN 10 MG/ML IV SOLN
INTRAVENOUS | Status: DC | PRN
  Administered 2018-01-02: 1000 mg via INTRAVENOUS

## 2018-01-02 MED ORDER — ACETAMINOPHEN 10 MG/ML IV SOLN
INTRAVENOUS | Status: AC
  Filled 2018-01-02: qty 100

## 2018-01-02 MED ORDER — BUPIVACAINE-EPINEPHRINE 0.25% -1:200000 IJ SOLN
INTRAMUSCULAR | Status: DC | PRN
  Administered 2018-01-02: 25 mL

## 2018-01-02 MED ORDER — HYDROCODONE-ACETAMINOPHEN 5-325 MG PO TABS: 1.0000 | ORAL_TABLET | ORAL | 0 refills | Status: DC | PRN

## 2018-01-02 MED ORDER — ONDANSETRON HCL 4 MG/2ML IJ SOLN: 4.0000 mg | Freq: Once | INTRAMUSCULAR | Status: DC | PRN

## 2018-01-02 MED ORDER — FENTANYL CITRATE (PF) 100 MCG/2ML IJ SOLN
INTRAMUSCULAR | Status: DC | PRN
  Administered 2018-01-02 (×4): 25 ug via INTRAVENOUS

## 2018-01-02 MED ORDER — ONDANSETRON HCL 4 MG/2ML IJ SOLN
INTRAMUSCULAR | Status: AC
  Filled 2018-01-02: qty 2

## 2018-01-02 MED ORDER — CHLORHEXIDINE GLUCONATE 4 % EX LIQD: 60.0000 mL | Freq: Once | CUTANEOUS | Status: DC

## 2018-01-02 MED ORDER — PROPOFOL 10 MG/ML IV BOLUS
INTRAVENOUS | Status: DC | PRN
  Administered 2018-01-02: 150 mg via INTRAVENOUS

## 2018-01-02 MED ORDER — GLYCOPYRROLATE 0.2 MG/ML IJ SOLN
INTRAMUSCULAR | Status: DC | PRN
  Administered 2018-01-02: 0.2 mg via INTRAVENOUS

## 2018-01-02 MED ORDER — DEXAMETHASONE SODIUM PHOSPHATE 10 MG/ML IJ SOLN
INTRAMUSCULAR | Status: DC | PRN
  Administered 2018-01-02: 5 mg via INTRAVENOUS

## 2018-01-02 MED ORDER — EPHEDRINE SULFATE 50 MG/ML IJ SOLN
INTRAMUSCULAR | Status: DC | PRN
  Administered 2018-01-02 (×2): 10 mg via INTRAVENOUS

## 2018-01-02 SURGICAL SUPPLY — 22 items
BLADE SHAVER 4.5 DBL SERAT CV (CUTTER) ×3 IMPLANT
CUFF TOURN 24 STER (MISCELLANEOUS) IMPLANT
CUFF TOURN 30 STER DUAL PORT (MISCELLANEOUS) ×3 IMPLANT
DRSG DERMACEA 8X12 NADH (GAUZE/BANDAGES/DRESSINGS) ×3 IMPLANT
DURAPREP 26ML APPLICATOR (WOUND CARE) ×6 IMPLANT
GAUZE SPONGE 4X4 12PLY STRL (GAUZE/BANDAGES/DRESSINGS) ×3 IMPLANT
GLOVE BIOGEL M STRL SZ7.5 (GLOVE) ×3 IMPLANT
GLOVE INDICATOR 8.0 STRL GRN (GLOVE) ×3 IMPLANT
GOWN STRL REUS W/ TWL LRG LVL3 (GOWN DISPOSABLE) ×2 IMPLANT
GOWN STRL REUS W/TWL LRG LVL3 (GOWN DISPOSABLE) ×4
IV LACTATED RINGER IRRG 3000ML (IV SOLUTION) ×12
IV LR IRRIG 3000ML ARTHROMATIC (IV SOLUTION) ×6 IMPLANT
KIT RM TURNOVER STRD PROC AR (KITS) ×3 IMPLANT
MANIFOLD NEPTUNE II (INSTRUMENTS) ×3 IMPLANT
PACK ARTHROSCOPY KNEE (MISCELLANEOUS) ×3 IMPLANT
SET TUBE SUCT SHAVER OUTFL 24K (TUBING) ×3 IMPLANT
SET TUBE TIP INTRA-ARTICULAR (MISCELLANEOUS) ×3 IMPLANT
SUT ETHILON 3-0 FS-10 30 BLK (SUTURE) ×3
SUTURE EHLN 3-0 FS-10 30 BLK (SUTURE) ×1 IMPLANT
TUBING ARTHRO INFLOW-ONLY STRL (TUBING) ×3 IMPLANT
WAND HAND CNTRL MULTIVAC 50 (MISCELLANEOUS) ×3 IMPLANT
WRAP KNEE W/COLD PACKS 25.5X14 (SOFTGOODS) ×3 IMPLANT

## 2018-01-02 NOTE — Anesthesia Post-op Follow-up Note (Signed)
Anesthesia QCDR form completed.        

## 2018-01-02 NOTE — Anesthesia Preprocedure Evaluation (Signed)
Anesthesia Evaluation  Patient identified by MRN, date of birth, ID band Patient awake    Reviewed: Allergy & Precautions, H&P , NPO status , Patient's Chart, lab work & pertinent test results, reviewed documented beta blocker date and time   Airway Mallampati: II  TM Distance: >3 FB Neck ROM: full    Dental  (+) Teeth Intact   Pulmonary neg pulmonary ROS, former smoker,    Pulmonary exam normal        Cardiovascular Exercise Tolerance: Good hypertension, On Medications + CAD  negative cardio ROS Normal cardiovascular exam Rate:Normal     Neuro/Psych PSYCHIATRIC DISORDERS negative neurological ROS  negative psych ROS   GI/Hepatic negative GI ROS, Neg liver ROS, PUD, GERD  ,  Endo/Other  negative endocrine ROSHypothyroidism   Renal/GU negative Renal ROS  negative genitourinary   Musculoskeletal   Abdominal   Peds  Hematology negative hematology ROS (+)   Anesthesia Other Findings   Reproductive/Obstetrics negative OB ROS                             Anesthesia Physical Anesthesia Plan  ASA: III  Anesthesia Plan: General LMA   Post-op Pain Management:    Induction:   PONV Risk Score and Plan:   Airway Management Planned:   Additional Equipment:   Intra-op Plan:   Post-operative Plan:   Informed Consent: I have reviewed the patients History and Physical, chart, labs and discussed the procedure including the risks, benefits and alternatives for the proposed anesthesia with the patient or authorized representative who has indicated his/her understanding and acceptance.     Plan Discussed with: CRNA  Anesthesia Plan Comments:         Anesthesia Quick Evaluation

## 2018-01-02 NOTE — Transfer of Care (Signed)
Immediate Anesthesia Transfer of Care Note  Patient: Gary Mueller  Procedure(s) Performed: ARTHROSCOPY KNEE (Right ) KNEE ARTHROSCOPY WITH MEDIAL MENISECTOMY (Right ) CHONDROPLASTY (Right )  Patient Location: PACU  Anesthesia Type:General  Level of Consciousness: sedated  Airway & Oxygen Therapy: Patient Spontanous Breathing and Patient connected to face mask oxygen  Post-op Assessment: Report given to RN and Post -op Vital signs reviewed and stable  Post vital signs: Reviewed and stable  Last Vitals:  Vitals:   01/02/18 1321 01/02/18 1608  BP: (!) 151/77   Pulse: (!) 58   Resp: 17   Temp: (!) 36.4 C (P) 36.7 C  SpO2: 99%     Last Pain:  Vitals:   01/02/18 1321  TempSrc: Oral  PainSc: 5          Complications: No apparent anesthesia complications

## 2018-01-02 NOTE — Progress Notes (Signed)
Pedal pulse positive to right foot  Toes warm   Can wiggle toes  Elevated on pillows

## 2018-01-02 NOTE — H&P (Signed)
The patient has been re-examined, and the chart reviewed, and there have been no interval changes to the documented history and physical.    The risks, benefits, and alternatives have been discussed at length. The patient expressed understanding of the risks benefits and agreed with plans for surgical intervention.  Makylie Rivere P. Jomaira Darr, [name] M.D.    

## 2018-01-02 NOTE — Op Note (Signed)
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   ASSISTANT: none  ANESTHESIA: general  ESTIMATED BLOOD LOSS: Minimal  FLUIDS REPLACED: 700 mL of crystalloid  TOURNIQUET TIME: Not used  DRAINS: none  IMPLANTS UTILIZED: None  INDICATIONS FOR SURGERY: TYLIN FORCE is a 77 y.o. year old male who has been seen for complaints of right knee pain.  Clinical findings were consistent with meniscal pathology. After discussion of the risks and benefits of surgical intervention, the patient expressed understanding of the risks benefits and agree with plans for right knee arthroscopy.   PROCEDURE IN DETAIL: The patient was brought into the operating room and, after adequate general anesthesia was achieved, a tourniquet was applied to the right thigh and the leg was placed in the leg holder. All bony prominences were well padded. The patient's right knee was cleaned and prepped with alcohol and Duraprep and draped in the usual sterile fashion. A "timeout" was performed as per usual protocol. The anticipated portal sites were injected with 0.25% Marcaine with epinephrine. An anterolateral incision was made and a cannula was inserted. A moderate effusion was evacuated and the knee was distended with fluid using the pump. The scope was advanced down the medial gutter into the medial compartment. Under visualization with the scope, an anteromedial portal was created and a hooked probe was inserted. The medial meniscus was visualized and probed.  There was a complex tear of the posterior horn of the  medial meniscus.  Tear was debrided using meniscal punches and a 4.5 mm incisor shaver.  Final contouring was performed using a 50 degree ArthroCare wand.  The articular cartilage was visualized.  There was a focal area of grade IV chondromalacia involving the medial tibial plateau.  Areas of grade III chondromalacia were noted to the medial femoral condyle and other areas of the medial tibial plateau.  The areas of chondromalacia were debrided and contoured using the ArthroCare wand.  The scope was then advanced into the intercondylar notch. The anterior cruciate ligament was visualized and probed and felt to be intact. The scope was removed from the lateral portal and reinserted via the anteromedial portal to better visualize the lateral compartment. The lateral meniscus was visualized and probed.  The lateral meniscus demonstrated only mild fraying but no stairs.  The articular cartilage of the lateral compartment was visualized and noted to be in good condition. Finally, the scope was advanced so as to visualize the patellofemoral articulation. Good patellar tracking was appreciated.   The knee was irrigated with copius amounts of fluid and suctioned dry. The anterolateral portal was re-approximated with #3-0 nylon. A combination of 0.25% Marcaine with epinephrine and 4 mg of Morphine were injected via the scope. The scope was removed and the anteromedial portal was re-approximated with #3-0 nylon. A sterile dressing was applied followed by application of an ice wrap.  The patient tolerated the procedure well and was transported to the PACU in stable condition.  Cleora Karnik P. Holley Bouche., M.D.

## 2018-01-02 NOTE — Anesthesia Procedure Notes (Signed)
Procedure Name: LMA Insertion Date/Time: 01/02/2018 2:42 PM Performed by: Dionne Bucy, CRNA Pre-anesthesia Checklist: Patient identified, Patient being monitored, Timeout performed, Emergency Drugs available and Suction available Patient Re-evaluated:Patient Re-evaluated prior to induction Oxygen Delivery Method: Circle system utilized Preoxygenation: Pre-oxygenation with 100% oxygen Induction Type: IV induction Ventilation: Mask ventilation without difficulty LMA: LMA inserted LMA Size: 5.0 Tube type: Oral Number of attempts: 1 Placement Confirmation: positive ETCO2 and breath sounds checked- equal and bilateral Tube secured with: Tape Dental Injury: Teeth and Oropharynx as per pre-operative assessment

## 2018-01-03 ENCOUNTER — Encounter: Payer: Self-pay | Admitting: Orthopedic Surgery

## 2018-01-04 NOTE — Anesthesia Postprocedure Evaluation (Signed)
Anesthesia Post Note  Patient: OAKLEE SUNGA  Procedure(s) Performed: ARTHROSCOPY KNEE (Right ) KNEE ARTHROSCOPY WITH MEDIAL MENISECTOMY (Right ) CHONDROPLASTY (Right )  Patient location during evaluation: PACU Anesthesia Type: General Level of consciousness: awake and alert Pain management: pain level controlled Vital Signs Assessment: post-procedure vital signs reviewed and stable Respiratory status: spontaneous breathing, nonlabored ventilation, respiratory function stable and patient connected to nasal cannula oxygen Cardiovascular status: blood pressure returned to baseline and stable Postop Assessment: no apparent nausea or vomiting Anesthetic complications: no     Last Vitals:  Vitals:   01/02/18 1645 01/02/18 1720  BP: (!) 144/63 (!) 159/71  Pulse: 65 65  Resp: 16   Temp: (!) 36.4 C   SpO2: 99% 99%    Last Pain:  Vitals:   01/03/18 0856  TempSrc:   PainSc: 4                  Martha Clan

## 2018-01-09 ENCOUNTER — Other Ambulatory Visit: Payer: Self-pay

## 2018-01-09 ENCOUNTER — Ambulatory Visit (INDEPENDENT_AMBULATORY_CARE_PROVIDER_SITE_OTHER): Payer: Medicare Other

## 2018-01-09 ENCOUNTER — Ambulatory Visit (INDEPENDENT_AMBULATORY_CARE_PROVIDER_SITE_OTHER): Payer: Medicare Other | Admitting: Family Medicine

## 2018-01-09 VITALS — BP 142/74 | HR 60 | Temp 97.5°F | Resp 16 | Wt 192.0 lb

## 2018-01-09 VITALS — BP 142/74 | HR 60 | Temp 97.5°F | Ht 72.0 in | Wt 192.2 lb

## 2018-01-09 DIAGNOSIS — E039 Hypothyroidism, unspecified: Secondary | ICD-10-CM

## 2018-01-09 DIAGNOSIS — I2510 Atherosclerotic heart disease of native coronary artery without angina pectoris: Secondary | ICD-10-CM | POA: Diagnosis not present

## 2018-01-09 DIAGNOSIS — I4891 Unspecified atrial fibrillation: Secondary | ICD-10-CM | POA: Diagnosis not present

## 2018-01-09 DIAGNOSIS — I10 Essential (primary) hypertension: Secondary | ICD-10-CM | POA: Diagnosis not present

## 2018-01-09 DIAGNOSIS — F419 Anxiety disorder, unspecified: Secondary | ICD-10-CM

## 2018-01-09 DIAGNOSIS — K219 Gastro-esophageal reflux disease without esophagitis: Secondary | ICD-10-CM

## 2018-01-09 DIAGNOSIS — Z0000 Encounter for general adult medical examination without abnormal findings: Secondary | ICD-10-CM | POA: Diagnosis not present

## 2018-01-09 DIAGNOSIS — E785 Hyperlipidemia, unspecified: Secondary | ICD-10-CM

## 2018-01-09 MED ORDER — AMLODIPINE BESYLATE 5 MG PO TABS: 5.0000 mg | ORAL_TABLET | Freq: Every day | ORAL | 3 refills | Status: DC

## 2018-01-09 NOTE — Progress Notes (Signed)
Gary Mueller  MRN: [id number] DOB: [birth year]  Subjective:  HPI   The patient is a 77 year old male who presents for follow up of his chronic illness.  His last visit to the office was for an acute visit for sinusitis.  Prior to that he was seen on 12/12/16.  Hypertension-The patient has a history of hypertension.  He is currently not on any medication for control.  BP Readings from Last 3 Encounters:  01/09/18 (!) 142/74  01/09/18 (!) 142/74  01/02/18 (!) 159/71   GERD-The patient has been on Omeprazole to control his acid reflux.  He reports  Hypothyroidism-Patient is due to have his thyroid level checked.  Hyperlipidemia-patient has history of hyperlipidemia.  He is currently not on medication.    Lab Results  Component Value Date   CHOL 228 (H) 11/14/2016   HDL 43 11/14/2016   LDLCALC 164 (H) 11/14/2016   TRIG 107 11/14/2016   Immunization History  Administered Date(s) Administered  . Influenza-Unspecified 10/05/2011, 09/25/2013  . Pneumococcal Conjugate-13 12/12/2016  . Pneumococcal Polysaccharide-23 08/10/1998  . Tdap 05/21/2012  . Zoster 12/12/2007   04/21/2013 Colonoscopy, Dr. Sylvan Cheese, otherwise normal, repeat in 10 years.   Patient Active Problem List   Diagnosis Date Noted  . Recurrent left inguinal hernia 01/04/2017  . Anxiety 06/17/2015  . Allergic rhinitis 06/17/2015  . CAD in native artery 06/17/2015  . A-fib ([place]) 06/17/2015  . Back ache 06/17/2015  . Gonalgia 06/17/2015  . Clinical depression 06/17/2015  . Dizziness 06/17/2015  . Polypharmacy 06/17/2015  . Fatigue 06/17/2015  . Cold sore 06/17/2015  . Difficulty hearing 06/17/2015  . HLD (hyperlipidemia) 06/17/2015  . BP (high blood pressure) 06/17/2015  . Adult hypothyroidism 06/17/2015  . Adaptive colitis 06/17/2015  . Cannot sleep 06/17/2015  . Meniere's disease 06/17/2015  . Gastroduodenal ulcer 06/17/2015  . Gastro-esophageal reflux disease without esophagitis 06/17/2015   . Buzzing in ear 06/17/2015  . Beat, premature ventricular 06/17/2015  . H/O total knee replacement 04/13/2015    Past Medical History:  Diagnosis Date  . Allergy   . Anxiety   . Arthritis   . Cancer ([place]) 10/20/2016   Basal Cell Skin Cancer  . Depression   . GERD (gastroesophageal reflux disease)   . HOH (hard of hearing)    Bilateral  . Hyperlipidemia   . Hypertension    Patient denies  . Hypothyroidism   . IBS (irritable bowel syndrome)   . Thyroid disease   . Tinnitus of both ears     Social History   Socioeconomic History  . Marital status: Married    Spouse name: Lelon Frohlich  . Number of children: 3  . Years of education: Not on file  . Highest education level: Some college, no degree  Social Needs  . Financial resource strain: Not hard at all  . Food insecurity - worry: Never true  . Food insecurity - inability: Never true  . Transportation needs - medical: No  . Transportation needs - non-medical: No  Occupational History  . Occupation: full time  Tobacco Use  . Smoking status: Former Smoker    Packs/day: 1.50    Types: Cigarettes    Last attempt to quit: 12/24/1964    Years since quitting: 53.0  . Smokeless tobacco: Never Used  Substance and Sexual Activity  . Alcohol use: Yes    Frequency: Never    Comment: wine rare  . Drug use: No  . Sexual activity: Not on file  Other Topics Concern  . Not on file  Social History Narrative  . Not on file    Outpatient Encounter Medications as of 01/09/2018  Medication Sig Note  . acetaminophen (TYLENOL) 500 MG tablet Take 500 mg by mouth every 6 (six) hours as needed.   . Ascorbic Acid (VITAMIN C) 1000 MG tablet Take 1,000 mg by mouth daily at 2 PM.    . aspirin EC 81 MG tablet Take 81 mg by mouth daily at 2 PM.   . cholecalciferol (VITAMIN D) 1000 units tablet Take 1,000 Units by mouth daily.  12/14/2017: On hold per patient   . levothyroxine (SYNTHROID, LEVOTHROID) 75 MCG tablet TAKE ONE TABLET BY MOUTH ONCE  DAILY (Patient taking differently: TAKE ONE TABLET BY MOUTH ONCE DAILY IN THE AFTERNOON.)   . Multiple Vitamin (MULTIVITAMIN WITH MINERALS) TABS tablet Take 1 tablet by mouth daily at 2 PM.   . omeprazole (PRILOSEC) 20 MG capsule Take 1 capsule (20 mg total) by mouth daily. (Patient taking differently: Take 20 mg by mouth daily as needed. )   . vitamin E 400 UNIT capsule Take 400 Units by mouth daily.    No facility-administered encounter medications on file as of 01/09/2018.     Allergies  Allergen Reactions  . Codeine     GI intolerance; hallucinations     Review of Systems  Constitutional: Negative.   HENT: Positive for tinnitus (chronic and unchanged).        Drooling   Eyes: Negative.   Respiratory: Positive for shortness of breath (chronic and unchanged).   Cardiovascular: Negative.   Gastrointestinal: Negative.   Genitourinary: Negative.   Musculoskeletal: Negative.   Skin: Negative.   Neurological: Negative.   Endo/Heme/Allergies: Negative.   Psychiatric/Behavioral: Negative.         Objective:  BP (!) 142/74 (BP Location: Right Arm, Patient Position: Sitting, Cuff Size: Normal)   Pulse 60   Temp (!) 97.5 F (36.4 C) (Oral)   Resp 16   Wt 192 lb (87.1 kg)   BMI 26.04 kg/m   Physical Exam  Constitutional: He is oriented to person, place, and time and well-developed, well-nourished, and in no distress.  HENT:  Head: Normocephalic and atraumatic.  Right Ear: External ear normal.  Left Ear: External ear normal.  Nose: Nose normal.  Mouth/Throat: Oropharynx is clear and moist.  Eyes: Conjunctivae and EOM are normal. Pupils are equal, round, and reactive to light.  Neck: Normal range of motion. Neck supple.  Cardiovascular: Normal rate, regular rhythm, normal heart sounds and intact distal pulses.  Pulmonary/Chest: Effort normal and breath sounds normal.  Abdominal: Soft. Bowel sounds are normal.  Genitourinary: Rectum normal, prostate normal and penis normal.    Genitourinary Comments: Small left inguinal hernia.  Musculoskeletal: Normal range of motion.  Neurological: He is alert and oriented to person, place, and time. Gait normal. GCS score is 15.  Skin: Skin is warm and dry.  Psychiatric: Mood, memory, affect and judgment normal.    Assessment and Plan :  1. Adult hypothyroidism  - TSH  2. Gastro-esophageal reflux disease without esophagitis Controlled. - Comprehensive metabolic panel  3. Hyperlipidemia, unspecified hyperlipidemia type Crestor 20 if LDL greater than 70. - Lipid Panel With LDL/HDL Ratio  4. Essential hypertension Watch /monitor BP at home. - CBC with Differential/Platelet - Comprehensive metabolic panel - TSH - amLODipine (NORVASC) 5 MG tablet; Take 1 tablet (5 mg total) by mouth daily.  Dispense: 90 tablet; Refill: 3 5.Left Inguinal  Hernia 6.Recent Knee Arthroscopy  7.Afib 8.CAD Treated medically.Discussed BP control and lowering LDLC.  I have done the exam and reviewed the chart and it is accurate to the best of my knowledge. Development worker, community has been used and  any errors in dictation or transcription are unintentional. Miguel Aschoff M.D. [facility]

## 2018-01-09 NOTE — Patient Instructions (Signed)
Gary Mueller , Thank you for taking time to come for your Medicare Wellness Visit. I appreciate your ongoing commitment to your health goals. Please review the following plan we discussed and let me know if I can assist you in the future.   Screening recommendations/referrals: Colonoscopy: Up to date Recommended yearly ophthalmology/optometry visit for glaucoma screening and checkup Recommended yearly dental visit for hygiene and checkup  Vaccinations: Influenza vaccine: Up to date Pneumococcal vaccine: Up to date Tdap vaccine: Up to date Shingles vaccine: Pt declines today Shingrix today but has had Zostavax in the past.     Advanced directives: Please bring a copy of your POA (Power of [place]) and/or Living Will to your next appointment.   Conditions/risks identified: Recommend increasing water intake to 4-6 glasses a day.   Next appointment: 2:00 PM today  Preventive Care 65 Years and Older, Male Preventive care refers to lifestyle choices and visits with your health care provider that can promote health and wellness. What does preventive care include?  A yearly physical exam. This is also called an annual well check.  Dental exams once or twice a year.  Routine eye exams. Ask your health care provider how often you should have your eyes checked.  Personal lifestyle choices, including:  Daily care of your teeth and gums.  Regular physical activity.  Eating a healthy diet.  Avoiding tobacco and drug use.  Limiting alcohol use.  Practicing safe sex.  Taking low doses of aspirin every day.  Taking vitamin and mineral supplements as recommended by your health care provider. What happens during an annual well check? The services and screenings done by your health care provider during your annual well check will depend on your age, overall health, lifestyle risk factors, and family history of disease. Counseling  Your health care provider may ask you questions about  your:  Alcohol use.  Tobacco use.  Drug use.  Emotional well-being.  Home and relationship well-being.  Sexual activity.  Eating habits.  History of falls.  Memory and ability to understand (cognition).  Work and work Statistician. Screening  You may have the following tests or measurements:  Height, weight, and BMI.  Blood pressure.  Lipid and cholesterol levels. These may be checked every 5 years, or more frequently if you are over 16 years old.  Skin check.  Lung cancer screening. You may have this screening every year starting at age 41 if you have a 30-pack-year history of smoking and currently smoke or have quit within the past 15 years.  Fecal occult blood test (FOBT) of the stool. You may have this test every year starting at age 62.  Flexible sigmoidoscopy or colonoscopy. You may have a sigmoidoscopy every 5 years or a colonoscopy every 10 years starting at age 50.  Prostate cancer screening. Recommendations will vary depending on your family history and other risks.  Hepatitis C blood test.  Hepatitis B blood test.  Sexually transmitted disease (STD) testing.  Diabetes screening. This is done by checking your blood sugar (glucose) after you have not eaten for a while (fasting). You may have this done every 1-3 years.  Abdominal aortic aneurysm (AAA) screening. You may need this if you are a current or former smoker.  Osteoporosis. You may be screened starting at age 86 if you are at high risk. Talk with your health care provider about your test results, treatment options, and if necessary, the need for more tests. Vaccines  Your health care provider may recommend  certain vaccines, such as:  Influenza vaccine. This is recommended every year.  Tetanus, diphtheria, and acellular pertussis (Tdap, Td) vaccine. You may need a Td booster every 10 years.  Zoster vaccine. You may need this after age 40.  Pneumococcal 13-valent conjugate (PCV13) vaccine.  One dose is recommended after age 73.  Pneumococcal polysaccharide (PPSV23) vaccine. One dose is recommended after age 47. Talk to your health care provider about which screenings and vaccines you need and how often you need them. This information is not intended to replace advice given to you by your health care provider. Make sure you discuss any questions you have with your health care provider. Document Released: 01/07/2016 Document Revised: 08/30/2016 Document Reviewed: 10/12/2015 Elsevier Interactive Patient Education  2017 [place] Prevention in the Home Falls can cause injuries. They can happen to people of all ages. There are many things you can do to make your home safe and to help prevent falls. What can I do on the outside of my home?  Regularly fix the edges of walkways and driveways and fix any cracks.  Remove anything that might make you trip as you walk through a door, such as a raised step or threshold.  Trim any bushes or trees on the path to your home.  Use bright outdoor lighting.  Clear any walking paths of anything that might make someone trip, such as rocks or tools.  Regularly check to see if handrails are loose or broken. Make sure that both sides of any steps have handrails.  Any raised decks and porches should have guardrails on the edges.  Have any leaves, snow, or ice cleared regularly.  Use sand or salt on walking paths during winter.  Clean up any spills in your garage right away. This includes oil or grease spills. What can I do in the bathroom?  Use night lights.  Install grab bars by the toilet and in the tub and shower. Do not use towel bars as grab bars.  Use non-skid mats or decals in the tub or shower.  If you need to sit down in the shower, use a plastic, non-slip stool.  Keep the floor dry. Clean up any water that spills on the floor as soon as it happens.  Remove soap buildup in the tub or shower regularly.  Attach bath  mats securely with double-sided non-slip rug tape.  Do not have throw rugs and other things on the floor that can make you trip. What can I do in the bedroom?  Use night lights.  Make sure that you have a light by your bed that is easy to reach.  Do not use any sheets or blankets that are too big for your bed. They should not hang down onto the floor.  Have a firm chair that has side arms. You can use this for support while you get dressed.  Do not have throw rugs and other things on the floor that can make you trip. What can I do in the kitchen?  Clean up any spills right away.  Avoid walking on wet floors.  Keep items that you use a lot in easy-to-reach places.  If you need to reach something above you, use a strong step stool that has a grab bar.  Keep electrical cords out of the way.  Do not use floor polish or wax that makes floors slippery. If you must use wax, use non-skid floor wax.  Do not have throw rugs and other  things on the floor that can make you trip. What can I do with my stairs?  Do not leave any items on the stairs.  Make sure that there are handrails on both sides of the stairs and use them. Fix handrails that are broken or loose. Make sure that handrails are as long as the stairways.  Check any carpeting to make sure that it is firmly attached to the stairs. Fix any carpet that is loose or worn.  Avoid having throw rugs at the top or bottom of the stairs. If you do have throw rugs, attach them to the floor with carpet tape.  Make sure that you have a light switch at the top of the stairs and the bottom of the stairs. If you do not have them, ask someone to add them for you. What else can I do to help prevent falls?  Wear shoes that:  Do not have high heels.  Have rubber bottoms.  Are comfortable and fit you well.  Are closed at the toe. Do not wear sandals.  If you use a stepladder:  Make sure that it is fully opened. Do not climb a closed  stepladder.  Make sure that both sides of the stepladder are locked into place.  Ask someone to hold it for you, if possible.  Clearly mark and make sure that you can see:  Any grab bars or handrails.  First and last steps.  Where the edge of each step is.  Use tools that help you move around (mobility aids) if they are needed. These include:  Canes.  Walkers.  Scooters.  Crutches.  Turn on the lights when you go into a dark area. Replace any light bulbs as soon as they burn out.  Set up your furniture so you have a clear path. Avoid moving your furniture around.  If any of your floors are uneven, fix them.  If there are any pets around you, be aware of where they are.  Review your medicines with your doctor. Some medicines can make you feel dizzy. This can increase your chance of falling. Ask your doctor what other things that you can do to help prevent falls. This information is not intended to replace advice given to you by your health care provider. Make sure you discuss any questions you have with your health care provider. Document Released: 10/07/2009 Document Revised: 05/18/2016 Document Reviewed: 01/15/2015 Elsevier Interactive Patient Education  2017 [employer].

## 2018-01-09 NOTE — Progress Notes (Signed)
Subjective:   Gary Mueller is a 77 y.o. male who presents for Medicare Annual/Subsequent preventive examination.  Review of Systems:  N/A  Cardiac Risk Factors include: advanced age (>42men, >72 women);dyslipidemia;hypertension;male gender     Objective:    Vitals: BP (!) 142/74 (BP Location: Left Arm)   Pulse 60   Temp (!) 97.5 F (36.4 C) (Oral)   Ht 6' (1.829 m)   Wt 192 lb 3.2 oz (87.2 kg)   BMI 26.07 kg/m   Body mass index is 26.07 kg/m.  Advanced Directives 01/09/2018 01/02/2018 12/26/2017 12/12/2016 06/12/2016 06/17/2015  Does Patient Have a Medical Advance Directive? - Yes Yes Yes Yes Yes  Type of Paramedic of [place];Living will [place];Living will [place];Living will Living will;Healthcare Power of Attorney Living will;Healthcare Power of Attorney Living will  Does patient want to make changes to medical advance directive? - - No - Patient declined - - -  Copy of [place] in Chart? No - copy requested No - copy requested - Yes No - copy requested -    Tobacco Social History   Tobacco Use  Smoking Status Former Smoker  . Packs/day: 1.50  . Types: Cigarettes  . Last attempt to quit: 12/24/1964  . Years since quitting: 53.0  Smokeless Tobacco Never Used     Counseling given: Not Answered   Clinical Intake:  Pre-visit preparation completed: Yes  Pain : 0-10 Pain Score: 5  Pain Type: Chronic pain Pain Location: Knee(Had a prepared meniscus in the right knee 1 week ago. ) Pain Orientation: Right     Nutritional Status: BMI 25 -29 Overweight Nutritional Risks: None Diabetes: No  How often do you need to have someone help you when you read instructions, pamphlets, or other written materials from your doctor or pharmacy?: 1 - Never  Interpreter Needed?: No  Information entered by :: [facility] LPN  Past Medical History:  Diagnosis Date  . Allergy   . Anxiety   .  Arthritis   . Cancer ([place]) 10/20/2016   Basal Cell Skin Cancer  . Depression   . GERD (gastroesophageal reflux disease)   . HOH (hard of hearing)    Bilateral  . Hyperlipidemia   . Hypertension    Patient denies  . Hypothyroidism   . IBS (irritable bowel syndrome)   . Thyroid disease   . Tinnitus of both ears    Past Surgical History:  Procedure Laterality Date  . CHOLECYSTECTOMY  1968  . CHONDROPLASTY Right 01/02/2018   Procedure: CHONDROPLASTY;  Surgeon: Dereck Leep, MD;  Location: [facility] ORS;  Service: Orthopedics;  Laterality: Right;  . COLONOSCOPY  2014  . HERNIA REPAIR Left 04/18/1996   inguinal hernia repair/ Dr Bary Castilla  . HERNIA REPAIR Right 02/26/1996   Dr Bary Castilla  . HERNIA REPAIR Left 08/07/2002   Dr Bary Castilla  . JOINT REPLACEMENT    . KNEE ARTHROSCOPY Right 01/02/2018   Procedure: ARTHROSCOPY KNEE;  Surgeon: Dereck Leep, MD;  Location: [facility] ORS;  Service: Orthopedics;  Laterality: Right;  . KNEE ARTHROSCOPY WITH MEDIAL MENISECTOMY Right 01/02/2018   Procedure: KNEE ARTHROSCOPY WITH MEDIAL MENISECTOMY;  Surgeon: Dereck Leep, MD;  Location: [facility] ORS;  Service: Orthopedics;  Laterality: Right;  . TONSILLECTOMY     as a child  . TOTAL KNEE ARTHROPLASTY Left 03/29/2015   [facility] Dr. Marry Guan   Family History  Problem Relation Age of Onset  . Heart disease Mother  A fib  . Healthy Sister    Social History   Socioeconomic History  . Marital status: Married    Spouse name: Lelon Frohlich  . Number of children: 3  . Years of education: None  . Highest education level: Some college, no degree  Social Needs  . Financial resource strain: Not hard at all  . Food insecurity - worry: Never true  . Food insecurity - inability: Never true  . Transportation needs - medical: No  . Transportation needs - non-medical: No  Occupational History  . Occupation: full time  Tobacco Use  . Smoking status: Former Smoker    Packs/day: 1.50    Types: Cigarettes    Last attempt to  quit: 12/24/1964    Years since quitting: 53.0  . Smokeless tobacco: Never Used  Substance and Sexual Activity  . Alcohol use: Yes    Frequency: Never    Comment: wine rare  . Drug use: No  . Sexual activity: None  Other Topics Concern  . None  Social History Narrative  . None    Outpatient Encounter Medications as of 01/09/2018  Medication Sig  . acetaminophen (TYLENOL) 500 MG tablet Take 500 mg by mouth every 6 (six) hours as needed.  . Ascorbic Acid (VITAMIN C) 1000 MG tablet Take 1,000 mg by mouth daily at 2 PM.   . aspirin EC 81 MG tablet Take 81 mg by mouth daily at 2 PM.  . cholecalciferol (VITAMIN D) 1000 units tablet Take 1,000 Units by mouth daily.   Marland Kitchen levothyroxine (SYNTHROID, LEVOTHROID) 75 MCG tablet TAKE ONE TABLET BY MOUTH ONCE DAILY (Patient taking differently: TAKE ONE TABLET BY MOUTH ONCE DAILY IN THE AFTERNOON.)  . Multiple Vitamin (MULTIVITAMIN WITH MINERALS) TABS tablet Take 1 tablet by mouth daily at 2 PM.  . omeprazole (PRILOSEC) 20 MG capsule Take 1 capsule (20 mg total) by mouth daily. (Patient taking differently: Take 20 mg by mouth daily as needed. )  . vitamin E 400 UNIT capsule Take 400 Units by mouth daily.  . [DISCONTINUED] ALPRAZolam (XANAX) 0.5 MG tablet Take 1 tablet (0.5 mg total) by mouth 2 (two) times daily as needed for anxiety. (Patient not taking: Reported on 01/09/2018)  . [DISCONTINUED] HYDROcodone-acetaminophen (NORCO) 5-325 MG tablet Take 1-2 tablets by mouth every 4 (four) hours as needed for moderate pain.  . [DISCONTINUED] ranitidine (ZANTAC) 150 MG tablet Take 150 mg by mouth daily as needed (for heartburn/indigestion.). Reported on 04/12/2016   No facility-administered encounter medications on file as of 01/09/2018.     Activities of Daily Living In your present state of health, do you have any difficulty performing the following activities: 01/09/2018 12/26/2017  Hearing? Tempie Donning  Comment wears hearing aids in both ears and has tinnitus wear  bilateral hearing aids  Vision? N N  Difficulty concentrating or making decisions? N N  Walking or climbing stairs? N Y  Dressing or bathing? N N  Doing errands, shopping? N -  Preparing Food and eating ? N -  Using the Toilet? N -  In the past six months, have you accidently leaked urine? N -  Do you have problems with loss of bowel control? N -  Managing your Medications? N -  Managing your Finances? N -  Housekeeping or managing your Housekeeping? N -  Some recent data might be hidden    Patient Care Team: Jerrol Banana., MD as PCP - General (Family Medicine) Bary Castilla, Forest Gleason, MD (General Surgery) Paraschos,  Sheppard Coil, MD as Consulting Physician (Cardiology)   Assessment:   This is a routine wellness examination for Lemay.  Exercise Activities and Dietary recommendations Current Exercise Habits: The patient does not participate in regular exercise at present, Exercise limited by: orthopedic condition(s)(Due to knee pain and surgery)  Goals    . DIET - INCREASE WATER INTAKE     Recommend increasing water intake to 4-6 glasses a day.        Fall Risk Fall Risk  01/09/2018 12/12/2016 08/23/2016 06/17/2015  Falls in the past year? No Yes Yes No  Comment - - Emmi Telephone Survey: data to providers prior to load -  Number falls in past yr: - 1 1 -  Comment - - Emmi Telephone Survey Actual Response = 1 -  Injury with Fall? - No No -  Follow up - Falls prevention discussed - -   Is the patient's home free of loose throw rugs in walkways, pet beds, electrical cords, etc?   yes      Grab bars in the bathroom? yes      Handrails on the stairs?   yes      Adequate lighting?   yes  Timed Get Up and Go Performed: N/A  Depression Screen PHQ 2/9 Scores 01/09/2018 12/12/2016 06/17/2015  PHQ - 2 Score 0 1 0    Cognitive Function: Pt declined screening today.      6CIT Screen 12/12/2016  What Year? 0 points  What month? 0 points  What time? 0 points  Count back from  20 0 points  Months in reverse 0 points  Repeat phrase 2 points  Total Score 2    Immunization History  Administered Date(s) Administered  . Influenza-Unspecified 10/05/2011, 09/25/2013  . Pneumococcal Conjugate-13 12/12/2016  . Pneumococcal Polysaccharide-23 08/10/1998  . Tdap 05/21/2012  . Zoster 12/12/2007    Qualifies for Shingles Vaccine? Due for Shingles vaccine. Declined my offer to administer today. Education has been provided regarding the importance of this vaccine. Pt has been advised to call her insurance company to determine her out of pocket expense. Advised she may also receive this vaccine at her local pharmacy or Health Dept. Verbalized acceptance and understanding.  Screening Tests Health Maintenance  Topic Date Due  . PNA vac Low Risk Adult (2 of 2 - PPSV23) 12/12/2017  . TETANUS/TDAP  05/21/2022  . INFLUENZA VACCINE  Completed   Cancer Screenings: Lung: Low Dose CT Chest recommended if Age 63-80 years, 30 pack-year currently smoking OR have quit w/in 15years. Patient does not qualify. Colorectal: Up to date  Additional Screenings:  Hepatitis B/HIV/Syphillis: Pt declines today.  Hepatitis C Screening: Pt declines today.     Plan:  I have personally reviewed and addressed the Medicare Annual Wellness questionnaire and have noted the following in the patient's chart:  A. Medical and social history B. Use of alcohol, tobacco or illicit drugs  C. Current medications and supplements D. Functional ability and status E.  Nutritional status F.  Physical activity G. Advance directives H. List of other physicians I.  Hospitalizations, surgeries, and ER visits in previous 12 months J.  [place] such as hearing and vision if needed, cognitive and depression L. Referrals and appointments - none  In addition, I have reviewed and discussed with patient certain preventive protocols, quality metrics, and best practice recommendations. A written  personalized care plan for preventive services as well as general preventive health recommendations were provided to patient.  See attached scanned  questionnaire for additional information.   Signed,  Fabio Neighbors, LPN Nurse Health Advisor   Nurse Recommendations: None.

## 2018-01-10 DIAGNOSIS — E039 Hypothyroidism, unspecified: Secondary | ICD-10-CM | POA: Diagnosis not present

## 2018-01-10 DIAGNOSIS — K219 Gastro-esophageal reflux disease without esophagitis: Secondary | ICD-10-CM | POA: Diagnosis not present

## 2018-01-10 DIAGNOSIS — E785 Hyperlipidemia, unspecified: Secondary | ICD-10-CM | POA: Diagnosis not present

## 2018-01-10 DIAGNOSIS — I10 Essential (primary) hypertension: Secondary | ICD-10-CM | POA: Diagnosis not present

## 2018-01-11 LAB — CBC WITH DIFFERENTIAL/PLATELET
BASOS: 0 %
Basophils Absolute: 0 10*3/uL (ref 0.0–0.2)
EOS (ABSOLUTE): 0.1 10*3/uL (ref 0.0–0.4)
EOS: 2 %
HEMATOCRIT: 44 % (ref 37.5–51.0)
Hemoglobin: 14.8 g/dL (ref 13.0–17.7)
Immature Grans (Abs): 0 10*3/uL (ref 0.0–0.1)
Immature Granulocytes: 0 %
LYMPHS ABS: 2.7 10*3/uL (ref 0.7–3.1)
Lymphs: 37 %
MCH: 32.1 pg (ref 26.6–33.0)
MCHC: 33.6 g/dL (ref 31.5–35.7)
MCV: 95 fL (ref 79–97)
MONOS ABS: 0.6 10*3/uL (ref 0.1–0.9)
Monocytes: 9 %
Neutrophils Absolute: 3.8 10*3/uL (ref 1.4–7.0)
Neutrophils: 52 %
Platelets: 201 10*3/uL (ref 150–379)
RBC: 4.61 x10E6/uL (ref 4.14–5.80)
RDW: 13.2 % (ref 12.3–15.4)
WBC: 7.2 10*3/uL (ref 3.4–10.8)

## 2018-01-11 LAB — LIPID PANEL WITH LDL/HDL RATIO
CHOLESTEROL TOTAL: 212 mg/dL — AB (ref 100–199)
HDL: 33 mg/dL — ABNORMAL LOW (ref >39)
LDL Calculated: 121 mg/dL — ABNORMAL HIGH (ref 0–99)
LDL/HDL RATIO: 3.7 ratio — AB (ref 0.0–3.6)
Triglycerides: 290 mg/dL — ABNORMAL HIGH (ref 0–149)
VLDL Cholesterol Cal: 58 mg/dL — ABNORMAL HIGH (ref 5–40)

## 2018-01-11 LAB — COMPREHENSIVE METABOLIC PANEL
A/G RATIO: 2.2 (ref 1.2–2.2)
ALK PHOS: 79 IU/L (ref 39–117)
ALT: 17 IU/L (ref 0–44)
AST: 14 IU/L (ref 0–40)
Albumin: 4.4 g/dL (ref 3.5–4.8)
BUN/Creatinine Ratio: 22 (ref 10–24)
BUN: 20 mg/dL (ref 8–27)
Bilirubin Total: 0.5 mg/dL (ref 0.0–1.2)
CO2: 26 mmol/L (ref 20–29)
Calcium: 10.3 mg/dL — ABNORMAL HIGH (ref 8.6–10.2)
Chloride: 103 mmol/L (ref 96–106)
Creatinine, Ser: 0.9 mg/dL (ref 0.76–1.27)
GFR calc Af Amer: 94 mL/min/{1.73_m2} (ref >59)
GFR calc non Af Amer: 82 mL/min/{1.73_m2} (ref >59)
GLOBULIN, TOTAL: 2 g/dL (ref 1.5–4.5)
Glucose: 97 mg/dL (ref 65–99)
POTASSIUM: 4.3 mmol/L (ref 3.5–5.2)
SODIUM: 143 mmol/L (ref 134–144)
Total Protein: 6.4 g/dL (ref 6.0–8.5)

## 2018-01-11 LAB — TSH: TSH: 8.28 u[IU]/mL — ABNORMAL HIGH (ref 0.450–4.500)

## 2018-01-14 ENCOUNTER — Telehealth: Payer: Self-pay

## 2018-01-14 ENCOUNTER — Other Ambulatory Visit: Payer: Self-pay

## 2018-01-14 MED ORDER — ROSUVASTATIN CALCIUM 20 MG PO TABS: 20.0000 mg | ORAL_TABLET | Freq: Every day | ORAL | 3 refills | Status: DC

## 2018-01-14 MED ORDER — LEVOTHYROXINE SODIUM 100 MCG PO TABS: 75.0000 ug | ORAL_TABLET | Freq: Every day | ORAL | 3 refills | Status: DC

## 2018-01-14 NOTE — Telephone Encounter (Signed)
Pt stated he was supposed to call back to advise Gary Mueller what his BP is this morning. Pt stated his BP was 150/78. Please advise. Thanks TNP

## 2018-01-14 NOTE — Telephone Encounter (Signed)
Advised  ED 

## 2018-01-14 NOTE — Telephone Encounter (Signed)
Patient was advised as below.    He wanted to inform us that since starting the blood pressure medicine he has been waking with a headache that lasts about an hour.  He is having dizziness when he gets up from sitting or when he bends over.  He also states that he is in general feeling weak.  He did not have any of these symptoms prior to starting the medicine. He was asked to have his blood pressure checked and let us know what the reading is when he has it done.  He said he would have to go somewhere and check it.    Please advise.   ----- Message from Jerrol Banana., MD sent at 01/11/2018  9:16 AM EST ----- Increase synthroid from 75 to 133mcg daily. Start Crestor 20mg  daily. Recheck 1 month.

## 2018-01-14 NOTE — Telephone Encounter (Signed)
Noted  ED

## 2018-01-14 NOTE — Telephone Encounter (Signed)
-----   Message from Jerrol Banana., MD sent at 01/11/2018  9:16 AM EST ----- Increase synthroid from 75 to 187mcg daily. Start Crestor 20mg  daily. Recheck 1 month.

## 2018-01-14 NOTE — Telephone Encounter (Signed)
Stop amlodipine and see if he feels better over next few days.

## 2018-01-22 ENCOUNTER — Encounter: Payer: Self-pay | Admitting: Family Medicine

## 2018-01-22 ENCOUNTER — Ambulatory Visit (INDEPENDENT_AMBULATORY_CARE_PROVIDER_SITE_OTHER): Payer: Medicare Other | Admitting: Family Medicine

## 2018-01-22 ENCOUNTER — Telehealth: Payer: Self-pay

## 2018-01-22 VITALS — BP 122/60 | HR 60 | Temp 97.9°F | Resp 16 | Wt 193.0 lb

## 2018-01-22 DIAGNOSIS — F419 Anxiety disorder, unspecified: Secondary | ICD-10-CM

## 2018-01-22 DIAGNOSIS — E785 Hyperlipidemia, unspecified: Secondary | ICD-10-CM

## 2018-01-22 DIAGNOSIS — I2510 Atherosclerotic heart disease of native coronary artery without angina pectoris: Secondary | ICD-10-CM

## 2018-01-22 DIAGNOSIS — I4891 Unspecified atrial fibrillation: Secondary | ICD-10-CM | POA: Diagnosis not present

## 2018-01-22 NOTE — Telephone Encounter (Signed)
Blood pressure has been running 170's over 60's-70's since the last visit.  Today it is 211/77 and patient is feeling a little light headed.   His wife has 3 medical appointments today one of which is at [facility].   Patient has been instructed to come in to the office and we would work him in.   He said he is going to try and come over now but, will call back if he is unable.

## 2018-01-22 NOTE — Progress Notes (Signed)
Gary Mueller  MRN: [id number] DOB: 11/05/1941  Subjective:  HPI   The patient is a 77 year old male who presents today for evaluation  Of elevated blood pressure with light headedness.  The patient was seen on 01/08/18 and started on Amlodipine for his blood pressure.    BP Readings from Last 3 Encounters:  01/22/18 122/60  01/09/18 (!) 142/74  01/09/18 (!) 142/74   Since that time he has been getting reading slightly higher than the reading we had in the office when he checked it at the pharmacy.  However, today he reports having a reading of 211/77 and light headed feeling.  He denies any chest pain.    He was at the cancer center with his wife and they checked his BP and got 156/71.  They told him that they thought his machine may not have been calibrated.  He has been using a wrist cuff.   Patient Active Problem List   Diagnosis Date Noted  . Recurrent left inguinal hernia 01/04/2017  . Anxiety 06/17/2015  . Allergic rhinitis 06/17/2015  . CAD in native artery 06/17/2015  . A-fib ([place]) 06/17/2015  . Back ache 06/17/2015  . Gonalgia 06/17/2015  . Clinical depression 06/17/2015  . Dizziness 06/17/2015  . Polypharmacy 06/17/2015  . Fatigue 06/17/2015  . Cold sore 06/17/2015  . Difficulty hearing 06/17/2015  . HLD (hyperlipidemia) 06/17/2015  . BP (high blood pressure) 06/17/2015  . Adult hypothyroidism 06/17/2015  . Adaptive colitis 06/17/2015  . Cannot sleep 06/17/2015  . Meniere's disease 06/17/2015  . Gastro-esophageal reflux disease without esophagitis 06/17/2015  . Buzzing in ear 06/17/2015  . Beat, premature ventricular 06/17/2015  . H/O total knee replacement 04/13/2015    Past Medical History:  Diagnosis Date  . Allergy   . Anxiety   . Arthritis   . Cancer ([place]) 10/20/2016   Basal Cell Skin Cancer  . Depression   . GERD (gastroesophageal reflux disease)   . HOH (hard of hearing)    Bilateral  . Hyperlipidemia   . Hypertension    Patient denies    . Hypothyroidism   . IBS (irritable bowel syndrome)   . Thyroid disease   . Tinnitus of both ears     Social History   Socioeconomic History  . Marital status: Married    Spouse name: Lelon Frohlich  . Number of children: 3  . Years of education: Not on file  . Highest education level: Some college, no degree  Social Needs  . Financial resource strain: Not hard at all  . Food insecurity - worry: Never true  . Food insecurity - inability: Never true  . Transportation needs - medical: No  . Transportation needs - non-medical: No  Occupational History  . Occupation: full time  Tobacco Use  . Smoking status: Former Smoker    Packs/day: 1.50    Types: Cigarettes    Last attempt to quit: 12/24/1964    Years since quitting: 53.1  . Smokeless tobacco: Never Used  Substance and Sexual Activity  . Alcohol use: Yes    Frequency: Never    Comment: wine rare  . Drug use: No  . Sexual activity: Not on file  Other Topics Concern  . Not on file  Social History Narrative  . Not on file    Outpatient Encounter Medications as of 01/22/2018  Medication Sig Note  . acetaminophen (TYLENOL) 500 MG tablet Take 500 mg by mouth every 6 (six) hours as needed.   Marland Kitchen  amLODipine (NORVASC) 5 MG tablet Take 1 tablet (5 mg total) by mouth daily.   . Ascorbic Acid (VITAMIN C) 1000 MG tablet Take 1,000 mg by mouth daily at 2 PM.    . aspirin EC 81 MG tablet Take 81 mg by mouth daily at 2 PM.   . cholecalciferol (VITAMIN D) 1000 units tablet Take 1,000 Units by mouth daily.  12/14/2017: On hold per patient   . levothyroxine (SYNTHROID, LEVOTHROID) 100 MCG tablet Take 1 tablet (100 mcg total) by mouth daily.   . Multiple Vitamin (MULTIVITAMIN WITH MINERALS) TABS tablet Take 1 tablet by mouth daily at 2 PM.   . omeprazole (PRILOSEC) 20 MG capsule Take 1 capsule (20 mg total) by mouth daily. (Patient taking differently: Take 20 mg by mouth daily as needed. )   . rosuvastatin (CRESTOR) 20 MG tablet Take 1 tablet (20  mg total) by mouth daily.   . vitamin E 400 UNIT capsule Take 400 Units by mouth daily.    No facility-administered encounter medications on file as of 01/22/2018.     Allergies  Allergen Reactions  . Codeine     GI intolerance; hallucinations     Review of Systems  Constitutional: Negative for malaise/fatigue.  HENT: Negative.   Eyes: Negative.   Respiratory: Positive for shortness of breath (chronic). Negative for cough and wheezing.   Cardiovascular: Negative for chest pain, palpitations, orthopnea, claudication and leg swelling.  Gastrointestinal: Negative.   Skin: Negative.   Neurological: Negative for dizziness (lightheadedness), weakness and headaches.  Endo/Heme/Allergies: Negative.   Psychiatric/Behavioral: Negative.     Objective:  BP 122/60 (BP Location: Right Arm, Patient Position: Sitting, Cuff Size: Normal)   Pulse 60   Temp 97.9 F (36.6 C) (Oral)   Resp 16   Wt 193 lb (87.5 kg)   BMI 26.18 kg/m   Physical Exam  Constitutional: He is oriented to person, place, and time and well-developed, well-nourished, and in no distress.  HENT:  Head: Normocephalic and atraumatic.  Right Ear: External ear normal.  Left Ear: External ear normal.  Nose: Nose normal.  Eyes: Conjunctivae are normal. Pupils are equal, round, and reactive to light. No scleral icterus.  Neck: Normal range of motion. No thyromegaly present.  Cardiovascular: Normal rate, regular rhythm and normal heart sounds.  Pulmonary/Chest: Effort normal and breath sounds normal.  Abdominal: Soft.  Neurological: He is alert and oriented to person, place, and time. Gait normal. GCS score is 15.  Skin: Skin is warm.  Psychiatric: Mood, memory, affect and judgment normal.    Assessment and Plan :  HTN Possibly double Norvasc to 10mg . GERD  I have done the exam and reviewed the chart and it is accurate to the best of my knowledge. Development worker, community has been used and  any errors in dictation or  transcription are unintentional. Miguel Aschoff M.D. [facility]

## 2018-02-01 DIAGNOSIS — L728 Other follicular cysts of the skin and subcutaneous tissue: Secondary | ICD-10-CM | POA: Diagnosis not present

## 2018-02-01 DIAGNOSIS — Z85828 Personal history of other malignant neoplasm of skin: Secondary | ICD-10-CM | POA: Diagnosis not present

## 2018-02-01 DIAGNOSIS — Z08 Encounter for follow-up examination after completed treatment for malignant neoplasm: Secondary | ICD-10-CM | POA: Diagnosis not present

## 2018-02-01 DIAGNOSIS — D485 Neoplasm of uncertain behavior of skin: Secondary | ICD-10-CM | POA: Diagnosis not present

## 2018-02-01 DIAGNOSIS — D0461 Carcinoma in situ of skin of right upper limb, including shoulder: Secondary | ICD-10-CM | POA: Diagnosis not present

## 2018-02-04 DIAGNOSIS — M25561 Pain in right knee: Secondary | ICD-10-CM | POA: Diagnosis not present

## 2018-02-13 ENCOUNTER — Ambulatory Visit (INDEPENDENT_AMBULATORY_CARE_PROVIDER_SITE_OTHER): Payer: Medicare Other | Admitting: Family Medicine

## 2018-02-13 ENCOUNTER — Encounter: Payer: Self-pay | Admitting: Family Medicine

## 2018-02-13 VITALS — BP 130/60 | HR 64 | Temp 97.6°F | Resp 16 | Wt 192.0 lb

## 2018-02-13 DIAGNOSIS — I2510 Atherosclerotic heart disease of native coronary artery without angina pectoris: Secondary | ICD-10-CM

## 2018-02-13 DIAGNOSIS — I10 Essential (primary) hypertension: Secondary | ICD-10-CM

## 2018-02-13 DIAGNOSIS — E785 Hyperlipidemia, unspecified: Secondary | ICD-10-CM | POA: Diagnosis not present

## 2018-02-13 MED ORDER — EZETIMIBE 10 MG PO TABS: 10.0000 mg | ORAL_TABLET | Freq: Every day | ORAL | 12 refills | Status: DC

## 2018-02-13 NOTE — Progress Notes (Signed)
Patient: Gary Mueller Male    DOB: [birth year]   77 y.o.   MRN: [id number] Visit Date: 02/13/2018  Today's Provider: Wilhemena Durie, MD   Chief Complaint  Patient presents with  . Follow-up  . Hypertension   Subjective:    HPI  Hypertension, follow-up:  BP Readings from Last 3 Encounters:  01/22/18 122/60  01/09/18 (!) 142/74  01/09/18 (!) 142/74    He was last seen for hypertension 4 weeks ago.  BP at that visit was 122/60. Management since that visit includes possibly doubling dose of Norvasc to 46m daily. He reports good compliance with treatment. He is not having side effects.  He is exercising. He is adherent to low salt diet.   Outside blood pressures are checked at the pharmacy and average 140-150/ 70's. He is experiencing chest pain, dyspnea and irregular heart beat.  Patient denies chest pressure/discomfort, lower extremity edema, near-syncope, orthopnea, palpitations, paroxysmal nocturnal dyspnea, syncope and tachypnea.   Cardiovascular risk factors include advanced age (older than 530for men, 69for women), hypertension and male gender.  Use of agents associated with hypertension: NSAIDS.     Weight trend: stable Wt Readings from Last 3 Encounters:  01/22/18 193 lb (87.5 kg)  01/09/18 192 lb (87.1 kg)  01/09/18 192 lb 3.2 oz (87.2 kg)    Current diet: in general, a "healthy" diet    ------------------------------------------------------------------------  Lipid/Cholesterol, Follow-up:   Last seen for this1 months ago.  Management changes since that visit include started Rosuvastatin.  . Last Lipid Panel:    Component Value Date/Time   CHOL 212 (H) 01/10/2018 0831   TRIG 290 (H) 01/10/2018 0831   HDL 33 (L) 01/10/2018 0831   LDLCALC 121 (H) 01/10/2018 0831    Risk factors for vascular disease include hypercholesterolemia and hypertension  He reports poor compliance with treatment. Patient stopped taking Rosuvastatin a few days  ago due to it causing severe body aches and dizziness. He is having side effects.  Current symptoms include visual disturbances and have been stable. Weight trend: stable Prior visit with dietician: no Current diet: in general, a "healthy" diet   Current exercise: Tai Chi and walking  Wt Readings from Last 3 Encounters:  01/22/18 193 lb (87.5 kg)  01/09/18 192 lb (87.1 kg)  01/09/18 192 lb 3.2 oz (87.2 kg)    -------------------------------------------------------------------     Allergies  Allergen Reactions  . Codeine     GI intolerance; hallucinations      Current Outpatient Medications:  .  acetaminophen (TYLENOL) 500 MG tablet, Take 500 mg by mouth every 6 (six) hours as needed., Disp: , Rfl:  .  amLODipine (NORVASC) 5 MG tablet, Take 1 tablet (5 mg total) by mouth daily., Disp: 90 tablet, Rfl: 3 .  Ascorbic Acid (VITAMIN C) 1000 MG tablet, Take 1,000 mg by mouth daily at 2 PM. , Disp: , Rfl:  .  aspirin EC 81 MG tablet, Take 81 mg by mouth daily at 2 PM., Disp: , Rfl:  .  cholecalciferol (VITAMIN D) 1000 units tablet, Take 1,000 Units by mouth daily. , Disp: , Rfl:  .  levothyroxine (SYNTHROID, LEVOTHROID) 100 MCG tablet, Take 1 tablet (100 mcg total) by mouth daily., Disp: 90 tablet, Rfl: 3 .  Multiple Vitamin (MULTIVITAMIN WITH MINERALS) TABS tablet, Take 1 tablet by mouth daily at 2 PM., Disp: , Rfl:  .  omeprazole (PRILOSEC) 20 MG capsule, Take 1 capsule (20 mg total)  by mouth daily. (Patient taking differently: Take 20 mg by mouth daily as needed. ), Disp: 90 capsule, Rfl: 3 .  rosuvastatin (CRESTOR) 20 MG tablet, Take 1 tablet (20 mg total) by mouth daily., Disp: 90 tablet, Rfl: 3 .  vitamin E 400 UNIT capsule, Take 400 Units by mouth daily., Disp: , Rfl:   Review of Systems  Constitutional: Negative for appetite change, chills and fever.  HENT: Negative.   Eyes: Negative.   Respiratory: Positive for shortness of breath. Negative for chest tightness and wheezing.    Cardiovascular: Positive for chest pain. Negative for palpitations.  Gastrointestinal: Negative for abdominal pain, nausea and vomiting.  Endocrine: Negative.   Musculoskeletal: Positive for myalgias (resolved after stopping Rosuvastatin).  Allergic/Immunologic: Negative.   Neurological: Negative.   Hematological: Negative.   Psychiatric/Behavioral: Negative.     Social History   Tobacco Use  . Smoking status: Former Smoker    Packs/day: 1.50    Types: Cigarettes    Last attempt to quit: 12/24/1964    Years since quitting: 53.1  . Smokeless tobacco: Never Used  Substance Use Topics  . Alcohol use: Yes    Frequency: Never    Comment: wine rare   Objective:   BP 130/60 (BP Location: Right Arm, Patient Position: Sitting, Cuff Size: Normal)   Pulse 64   Temp 97.6 F (36.4 C) (Oral)   Resp 16   Wt 192 lb (87.1 kg)   SpO2 96% Comment: room air  BMI 26.04 kg/m  There were no vitals filed for this visit.   Physical Exam  Constitutional: He is oriented to person, place, and time. He appears well-developed and well-nourished.  HENT:  Head: Normocephalic and atraumatic.  Eyes: Conjunctivae are normal.  Neck: No thyromegaly present.  Cardiovascular: Normal rate, regular rhythm and normal heart sounds.  Pulmonary/Chest: Effort normal and breath sounds normal.  Abdominal: Soft.  Neurological: He is alert and oriented to person, place, and time.  Skin: Skin is warm and dry.  Psychiatric: He has a normal mood and affect. His behavior is normal. Judgment and thought content normal.        Assessment & Plan:     1. Hyperlipidemia, unspecified hyperlipidemia type  Patient is intolerance to Statins. Will discontinue Rosuvastatin and try Zetia 27m daily. Will check Lipids in 4 weeks.  - ezetimibe (ZETIA) 10 MG tablet; Take 1 tablet (10 mg total) by mouth daily.  Dispense: 30 tablet; Refill: 12  2. Essential hypertension Stable. No change. Continue current medication. 3.Mild  CAD     I have done the exam and reviewed the above chart and it is accurate to the best of my knowledge. DDevelopment worker, communityhas been used in this note in any air is in the dictation or transcription are unintentional.  RWilhemena Durie MD  [facility]

## 2018-02-28 DIAGNOSIS — D0461 Carcinoma in situ of skin of right upper limb, including shoulder: Secondary | ICD-10-CM | POA: Diagnosis not present

## 2018-03-04 DIAGNOSIS — R0602 Shortness of breath: Secondary | ICD-10-CM | POA: Diagnosis not present

## 2018-03-04 DIAGNOSIS — R079 Chest pain, unspecified: Secondary | ICD-10-CM | POA: Diagnosis not present

## 2018-03-04 DIAGNOSIS — I493 Ventricular premature depolarization: Secondary | ICD-10-CM | POA: Diagnosis not present

## 2018-03-04 DIAGNOSIS — I2510 Atherosclerotic heart disease of native coronary artery without angina pectoris: Secondary | ICD-10-CM | POA: Diagnosis not present

## 2018-03-04 DIAGNOSIS — E7801 Familial hypercholesterolemia: Secondary | ICD-10-CM | POA: Diagnosis not present

## 2018-03-08 DIAGNOSIS — M1711 Unilateral primary osteoarthritis, right knee: Secondary | ICD-10-CM | POA: Diagnosis not present

## 2018-03-18 DIAGNOSIS — M1711 Unilateral primary osteoarthritis, right knee: Secondary | ICD-10-CM | POA: Diagnosis not present

## 2018-03-25 DIAGNOSIS — M1711 Unilateral primary osteoarthritis, right knee: Secondary | ICD-10-CM | POA: Diagnosis not present

## 2018-04-29 DIAGNOSIS — R21 Rash and other nonspecific skin eruption: Secondary | ICD-10-CM | POA: Diagnosis not present

## 2018-07-09 DIAGNOSIS — L300 Nummular dermatitis: Secondary | ICD-10-CM | POA: Diagnosis not present

## 2018-08-27 DIAGNOSIS — H2513 Age-related nuclear cataract, bilateral: Secondary | ICD-10-CM | POA: Diagnosis not present

## 2018-09-02 DIAGNOSIS — R0602 Shortness of breath: Secondary | ICD-10-CM | POA: Diagnosis not present

## 2018-09-02 DIAGNOSIS — I493 Ventricular premature depolarization: Secondary | ICD-10-CM | POA: Diagnosis not present

## 2018-09-02 DIAGNOSIS — I2510 Atherosclerotic heart disease of native coronary artery without angina pectoris: Secondary | ICD-10-CM | POA: Diagnosis not present

## 2018-09-02 DIAGNOSIS — I480 Paroxysmal atrial fibrillation: Secondary | ICD-10-CM | POA: Diagnosis not present

## 2018-09-02 DIAGNOSIS — E7801 Familial hypercholesterolemia: Secondary | ICD-10-CM | POA: Diagnosis not present

## 2018-09-23 DIAGNOSIS — H2511 Age-related nuclear cataract, right eye: Secondary | ICD-10-CM | POA: Diagnosis not present

## 2018-09-24 DIAGNOSIS — Z23 Encounter for immunization: Secondary | ICD-10-CM | POA: Diagnosis not present

## 2018-10-02 ENCOUNTER — Encounter: Payer: Self-pay | Admitting: *Deleted

## 2018-10-02 DIAGNOSIS — M25561 Pain in right knee: Secondary | ICD-10-CM | POA: Diagnosis not present

## 2018-10-02 DIAGNOSIS — M1711 Unilateral primary osteoarthritis, right knee: Secondary | ICD-10-CM | POA: Diagnosis not present

## 2018-10-10 ENCOUNTER — Ambulatory Visit: Payer: Medicare Other | Admitting: Certified Registered Nurse Anesthetist

## 2018-10-10 ENCOUNTER — Encounter: Payer: Self-pay | Admitting: *Deleted

## 2018-10-10 ENCOUNTER — Other Ambulatory Visit: Payer: Self-pay

## 2018-10-10 ENCOUNTER — Encounter
Admission: RE | Disposition: A | Discharge status: Home / Self Care | Payer: Self-pay | Source: Ambulatory Visit | Attending: Ophthalmology

## 2018-10-10 ENCOUNTER — Ambulatory Visit
Admission: RE | Admit: 2018-10-10 | Discharge: 2018-10-10 | Disposition: A | Discharge status: Home / Self Care | Payer: Medicare Other | Source: Ambulatory Visit | Attending: Ophthalmology | Admitting: Ophthalmology

## 2018-10-10 DIAGNOSIS — H2511 Age-related nuclear cataract, right eye: Secondary | ICD-10-CM | POA: Insufficient documentation

## 2018-10-10 DIAGNOSIS — I10 Essential (primary) hypertension: Secondary | ICD-10-CM | POA: Diagnosis not present

## 2018-10-10 DIAGNOSIS — Z79899 Other long term (current) drug therapy: Secondary | ICD-10-CM | POA: Diagnosis not present

## 2018-10-10 DIAGNOSIS — Z85828 Personal history of other malignant neoplasm of skin: Secondary | ICD-10-CM | POA: Diagnosis not present

## 2018-10-10 DIAGNOSIS — K219 Gastro-esophageal reflux disease without esophagitis: Secondary | ICD-10-CM | POA: Diagnosis not present

## 2018-10-10 DIAGNOSIS — Z888 Allergy status to other drugs, medicaments and biological substances status: Secondary | ICD-10-CM | POA: Insufficient documentation

## 2018-10-10 DIAGNOSIS — Z7982 Long term (current) use of aspirin: Secondary | ICD-10-CM | POA: Diagnosis not present

## 2018-10-10 DIAGNOSIS — M1990 Unspecified osteoarthritis, unspecified site: Secondary | ICD-10-CM | POA: Diagnosis not present

## 2018-10-10 DIAGNOSIS — F419 Anxiety disorder, unspecified: Secondary | ICD-10-CM | POA: Insufficient documentation

## 2018-10-10 DIAGNOSIS — Z87891 Personal history of nicotine dependence: Secondary | ICD-10-CM | POA: Insufficient documentation

## 2018-10-10 DIAGNOSIS — E785 Hyperlipidemia, unspecified: Secondary | ICD-10-CM | POA: Insufficient documentation

## 2018-10-10 DIAGNOSIS — I4891 Unspecified atrial fibrillation: Secondary | ICD-10-CM | POA: Diagnosis not present

## 2018-10-10 DIAGNOSIS — E039 Hypothyroidism, unspecified: Secondary | ICD-10-CM | POA: Insufficient documentation

## 2018-10-10 DIAGNOSIS — F329 Major depressive disorder, single episode, unspecified: Secondary | ICD-10-CM | POA: Insufficient documentation

## 2018-10-10 DIAGNOSIS — Z885 Allergy status to narcotic agent status: Secondary | ICD-10-CM | POA: Insufficient documentation

## 2018-10-10 DIAGNOSIS — K589 Irritable bowel syndrome without diarrhea: Secondary | ICD-10-CM | POA: Diagnosis not present

## 2018-10-10 HISTORY — DX: Cardiac arrhythmia, unspecified: I49.9

## 2018-10-10 HISTORY — PX: CATARACT EXTRACTION W/PHACO: SHX586

## 2018-10-10 SURGERY — PHACOEMULSIFICATION, CATARACT, WITH IOL INSERTION
Anesthesia: Monitor Anesthesia Care | Site: Eye | Laterality: Right

## 2018-10-10 MED ORDER — LIDOCAINE HCL (PF) 4 % IJ SOLN
INTRAMUSCULAR | Status: AC
  Filled 2018-10-10: qty 5

## 2018-10-10 MED ORDER — MOXIFLOXACIN HCL 0.5 % OP SOLN
OPHTHALMIC | Status: DC | PRN
  Administered 2018-10-10: 0.2 mL via OPHTHALMIC

## 2018-10-10 MED ORDER — SODIUM CHLORIDE 0.9 % IV SOLN
INTRAVENOUS | Status: DC
  Administered 2018-10-10: 06:00:00 via INTRAVENOUS

## 2018-10-10 MED ORDER — EPINEPHRINE PF 1 MG/ML IJ SOLN
INTRAMUSCULAR | Status: AC
  Filled 2018-10-10: qty 1

## 2018-10-10 MED ORDER — POVIDONE-IODINE 5 % OP SOLN
OPHTHALMIC | Status: DC | PRN
  Administered 2018-10-10: 1 via OPHTHALMIC

## 2018-10-10 MED ORDER — FENTANYL CITRATE (PF) 100 MCG/2ML IJ SOLN
INTRAMUSCULAR | Status: AC
  Filled 2018-10-10: qty 2

## 2018-10-10 MED ORDER — NA HYALUR & NA CHOND-NA HYALUR 0.55-0.5 ML IO KIT
PACK | INTRAOCULAR | Status: DC | PRN
  Administered 2018-10-10: 1 via OPHTHALMIC

## 2018-10-10 MED ORDER — TRYPAN BLUE 0.06 % OP SOLN
OPHTHALMIC | Status: DC | PRN
  Administered 2018-10-10: 0.5 mL via INTRAOCULAR

## 2018-10-10 MED ORDER — NA HYALUR & NA CHOND-NA HYALUR 0.55-0.5 ML IO KIT
PACK | INTRAOCULAR | Status: AC
  Filled 2018-10-10: qty 1.05

## 2018-10-10 MED ORDER — TETRACAINE HCL 0.5 % OP SOLN
1.0000 [drp] | OPHTHALMIC | Status: DC | PRN
  Administered 2018-10-10 (×3): 1 [drp] via OPHTHALMIC

## 2018-10-10 MED ORDER — ARMC OPHTHALMIC DILATING DROPS
OPHTHALMIC | Status: AC
  Administered 2018-10-10: 1 via OPHTHALMIC
  Filled 2018-10-10: qty 0.5

## 2018-10-10 MED ORDER — POVIDONE-IODINE 5 % OP SOLN
OPHTHALMIC | Status: AC
  Filled 2018-10-10: qty 30

## 2018-10-10 MED ORDER — EPINEPHRINE PF 1 MG/ML IJ SOLN
INTRAOCULAR | Status: DC | PRN
  Administered 2018-10-10: 1 mL via OPHTHALMIC

## 2018-10-10 MED ORDER — MOXIFLOXACIN HCL 0.5 % OP SOLN
1.0000 [drp] | OPHTHALMIC | Status: DC | PRN
Start: 2018-10-10 — End: 2018-10-10

## 2018-10-10 MED ORDER — ARMC OPHTHALMIC DILATING DROPS
1.0000 "application " | OPHTHALMIC | Status: AC
  Administered 2018-10-10 (×3): 1 via OPHTHALMIC

## 2018-10-10 MED ORDER — MOXIFLOXACIN HCL 0.5 % OP SOLN
OPHTHALMIC | Status: AC
  Filled 2018-10-10: qty 3

## 2018-10-10 MED ORDER — FENTANYL CITRATE (PF) 100 MCG/2ML IJ SOLN
INTRAMUSCULAR | Status: DC | PRN
  Administered 2018-10-10 (×2): 25 ug via INTRAVENOUS
  Administered 2018-10-10: 50 ug via INTRAVENOUS

## 2018-10-10 MED ORDER — LIDOCAINE HCL (PF) 4 % IJ SOLN
INTRAOCULAR | Status: DC | PRN
  Administered 2018-10-10: 2 mL via OPHTHALMIC

## 2018-10-10 MED ORDER — TETRACAINE HCL 0.5 % OP SOLN
OPHTHALMIC | Status: AC
  Administered 2018-10-10: 1 [drp] via OPHTHALMIC
  Filled 2018-10-10: qty 4

## 2018-10-10 MED ORDER — TRYPAN BLUE 0.06 % OP SOLN
OPHTHALMIC | Status: AC
  Filled 2018-10-10: qty 0.5

## 2018-10-10 SURGICAL SUPPLY — 19 items
DISSECTOR HYDRO NUCLEUS 50X22 (MISCELLANEOUS) ×12 IMPLANT
GLOVE BIOGEL M 6.5 STRL (GLOVE) ×3 IMPLANT
GOWN STRL REUS W/ TWL LRG LVL3 (GOWN DISPOSABLE) ×1 IMPLANT
GOWN STRL REUS W/ TWL XL LVL3 (GOWN DISPOSABLE) ×1 IMPLANT
GOWN STRL REUS W/TWL LRG LVL3 (GOWN DISPOSABLE) ×2
GOWN STRL REUS W/TWL XL LVL3 (GOWN DISPOSABLE) ×2
KNIFE 45D UP 2.3 (MISCELLANEOUS) ×3 IMPLANT
LABEL CATARACT MEDS ST (LABEL) ×3 IMPLANT
LENS IOL ACRSF IQ TRC 4 19.5 ×1 IMPLANT
LENS IOL ACRYSOF IQ TORIC 19.5 ×2 IMPLANT
LENS IOL IQ TORIC 4 19.5 ×1 IMPLANT
PACK CATARACT (MISCELLANEOUS) ×3 IMPLANT
PACK CATARACT KING (MISCELLANEOUS) ×3 IMPLANT
PACK EYE AFTER SURG (MISCELLANEOUS) ×3 IMPLANT
SOL BSS BAG (MISCELLANEOUS) ×3
SOLUTION BSS BAG (MISCELLANEOUS) ×1 IMPLANT
SYR 5ML LL (SYRINGE) ×3 IMPLANT
WATER STERILE IRR 250ML POUR (IV SOLUTION) ×3 IMPLANT
WIPE NON LINTING 3.25X3.25 (MISCELLANEOUS) ×3 IMPLANT

## 2018-10-10 NOTE — H&P (Signed)
  I have reviewed with the H&P and agree with its findings. There have been no interval changes.  Marchia Meiers MD

## 2018-10-10 NOTE — Transfer of Care (Signed)
Immediate Anesthesia Transfer of Care Note  Patient: Gary Mueller  Procedure(s) Performed: CATARACT EXTRACTION PHACO AND INTRAOCULAR LENS PLACEMENT (IOC) (Right Eye)  Patient Location: Short Stay  Anesthesia Type:MAC  Level of Consciousness: awake, alert  and oriented  Airway & Oxygen Therapy: Patient Spontanous Breathing  Post-op Assessment: Report given to RN and Post -op Vital signs reviewed and stable  Post vital signs: Reviewed and stable  Last Vitals:  Vitals Value Taken Time  BP 155/61 10/10/2018  8:40 AM  Temp 36.4 C 10/10/2018  8:40 AM  Pulse 56 10/10/2018  8:40 AM  Resp 16 10/10/2018  8:40 AM  SpO2 99 % 10/10/2018  8:40 AM    Last Pain:  Vitals:   10/10/18 0840  TempSrc: Temporal  PainSc: 0-No pain         Complications: No apparent anesthesia complications

## 2018-10-10 NOTE — Anesthesia Procedure Notes (Signed)
Procedure Name: MAC Performed by: Demetrius Charity, CRNA Pre-anesthesia Checklist: Patient identified, Emergency Drugs available, Suction available, Patient being monitored and Timeout performed Oxygen Delivery Method: Nasal cannula

## 2018-10-10 NOTE — Anesthesia Post-op Follow-up Note (Signed)
Anesthesia QCDR form completed.        

## 2018-10-10 NOTE — Anesthesia Postprocedure Evaluation (Signed)
Anesthesia Post Note  Patient: Gary Mueller  Procedure(s) Performed: CATARACT EXTRACTION PHACO AND INTRAOCULAR LENS PLACEMENT (IOC) (Right Eye)  Patient location during evaluation: Short Stay Anesthesia Type: MAC Level of consciousness: awake and alert and oriented Pain management: satisfactory to patient Vital Signs Assessment: post-procedure vital signs reviewed and stable Respiratory status: spontaneous breathing Cardiovascular status: stable Anesthetic complications: no     Last Vitals:  Vitals:   10/10/18 0610 10/10/18 0840  BP:  (!) 155/61  Pulse:  (!) 56  Resp:  16  Temp: (!) 36.3 C 36.4 C  SpO2:  99%    Last Pain:  Vitals:   10/10/18 0840  TempSrc: Temporal  PainSc: 0-No pain                 Blima Singer

## 2018-10-10 NOTE — Op Note (Signed)
  PREOPERATIVE DIAGNOSIS:  Nuclear sclerotic cataract of the right eye.   POSTOPERATIVE DIAGNOSIS:  Nuclear sclerotic cataract of the right eye.   OPERATIVE PROCEDURE: Procedure(s): CATARACT EXTRACTION PHACO AND INTRAOCULAR LENS PLACEMENT (IOC)   SURGEON:  Marchia Meiers, MD.   ANESTHESIA: 1.      Managed anesthesia care. 2.     0.58ml of Shugarcaine was instilled following the paracentesis  Anesthesiologist: Emmie Niemann, MD CRNA: Demetrius Charity, CRNA  COMPLICATIONS:  None.   TECHNIQUE:  Divide and conquer    DESCRIPTION OF PROCEDURE:  The patient was examined and consented in the preoperative holding area where the aforementioned topical anesthesia was applied to the right eye.  The patient was brought back to the Operating Room where he was sat upright on the gurney and given a target to fixate upon while the eye was marked at the 3:00 and 9:00 position.  The patient was then reclined on the operating table.  The eye was prepped and draped in the usual sterile ophthalmic fashion and a lid speculum was placed. A paracentesis was created with the side port blade and the anterior chamber was filled with viscoelastic. A near clear corneal incision was performed with the steel keratome. A continuous curvilinear capsulorrhexis was performed with a cystotome followed by the capsulorrhexis forceps. Hydrodissection and hydrodelineation were carried out with BSS on a blunt cannula. The lens was removed and the remaining cortical material was removed with the irrigation-aspiration handpiece. The eye was inflated with viscoelastic and the lens  was placed in the eye and rotated to within a few degrees of the predetermined orientation.  The remaining viscoelastic was removed from the eye.  The Sinskey hook was used to rotate the toric lens into its final resting place at 179 degrees.  The eye was inflated to a physiologic pressure and found to be watertight. 0.86ml of Vigamox was placed in the anterior  chamber.  The eye was dressed with Vigamox. The patient was given protective glasses to wear throughout the day and a shield with which to sleep tonight. The patient was also given drops with which to begin a drop regimen today and will follow-up with me in one day. Implant Name Type Inv. Item Serial No. Manufacturer Lot No. LRB No. Used  LENS IOL TORIC 19.5 - [id number]  LENS IOL TORIC 19.5 [id number] ALCON  Right 1   Procedure(s) with comments: CATARACT EXTRACTION PHACO AND INTRAOCULAR LENS PLACEMENT (IOC) (Right) - [place] 00:41 CDE 6.87 Fluid pack lot # [id number]  Electronically signed: Marchia Meiers 10/10/2018 8:41 AM

## 2018-10-10 NOTE — Anesthesia Preprocedure Evaluation (Signed)
Anesthesia Evaluation  Patient identified by MRN, date of birth, ID band Patient awake    Reviewed: Allergy & Precautions, NPO status , Patient's Chart, lab work & pertinent test results  History of Anesthesia Complications Negative for: history of anesthetic complications  Airway Mallampati: II  TM Distance: >3 FB Neck ROM: Full    Dental no notable dental hx.    Pulmonary neg sleep apnea, neg COPD, former smoker,    breath sounds clear to auscultation- rhonchi (-) wheezing      Cardiovascular Exercise Tolerance: Good hypertension, Pt. on medications (-) CAD, (-) Past MI, (-) Cardiac Stents and (-) CABG + dysrhythmias Atrial Fibrillation  Rhythm:Regular Rate:Normal - Systolic murmurs and - Diastolic murmurs    Neuro/Psych PSYCHIATRIC DISORDERS Anxiety Depression negative neurological ROS     GI/Hepatic Neg liver ROS, GERD  ,  Endo/Other  neg diabetesHypothyroidism   Renal/GU negative Renal ROS     Musculoskeletal  (+) Arthritis ,   Abdominal (+) - obese,   Peds  Hematology   Anesthesia Other Findings Past Medical History: No date: Allergy No date: Anxiety No date: Arthritis 10/20/2016: Cancer ([place])     Comment:  Basal Cell Skin Cancer No date: Depression No date: Dysrhythmia     Comment:  A FIB No date: GERD (gastroesophageal reflux disease) No date: HOH (hard of hearing)     Comment:  Bilateral No date: Hyperlipidemia No date: Hypertension     Comment:  Patient denies No date: Hypothyroidism No date: IBS (irritable bowel syndrome) No date: Thyroid disease No date: Tinnitus of both ears   Reproductive/Obstetrics                             Anesthesia Physical Anesthesia Plan  ASA: II  Anesthesia Plan: MAC   Post-op Pain Management:    Induction: Intravenous  PONV Risk Score and Plan: 1 and Midazolam  Airway Management Planned: Natural Airway  Additional  Equipment:   Intra-op Plan:   Post-operative Plan:   Informed Consent: I have reviewed the patients History and Physical, chart, labs and discussed the procedure including the risks, benefits and alternatives for the proposed anesthesia with the patient or authorized representative who has indicated his/her understanding and acceptance.     Plan Discussed with: CRNA and Anesthesiologist  Anesthesia Plan Comments:         Anesthesia Quick Evaluation

## 2018-10-10 NOTE — Discharge Instructions (Addendum)
Eye Surgery Discharge Instructions  Expect mild scratchy sensation or mild soreness. DO NOT RUB YOUR EYE!  The day of surgery:  Minimal physical activity, but bed rest is not required  No reading, computer work, or close hand work  No bending, lifting, or straining.  May watch TV  For 24 hours:  No driving, legal decisions, or alcoholic beverages  Safety precautions  Eat anything you prefer: It is better to start with liquids, then soup then solid foods.  Solar shield eyeglasses should be worn for comfort in the sunlight/patch while sleeping  Resume all regular medications including aspirin or Coumadin if these were discontinued prior to surgery. You may shower, bathe, shave, or wash your hair. Tylenol may be taken for mild discomfort. EYE DROPS AS INSTRUCTED BY MD - SEE EYE DROP INSTRUCTION SHEET.  Call your doctor if you experience significant pain, nausea, or vomiting, fever > 101 or other signs of infection. [phone] or [phone] Specific instructions:  Follow-up Information    Marchia Meiers, MD Follow up.   Specialty:  Ophthalmology Why:  10/11/18 @ 9:00 am Contact information: [address] [place] [address] [phone]

## 2018-11-11 DIAGNOSIS — H2512 Age-related nuclear cataract, left eye: Secondary | ICD-10-CM | POA: Diagnosis not present

## 2018-11-13 ENCOUNTER — Encounter: Payer: Self-pay | Admitting: *Deleted

## 2018-11-14 DIAGNOSIS — D2272 Melanocytic nevi of left lower limb, including hip: Secondary | ICD-10-CM | POA: Diagnosis not present

## 2018-11-14 DIAGNOSIS — D225 Melanocytic nevi of trunk: Secondary | ICD-10-CM | POA: Diagnosis not present

## 2018-11-14 DIAGNOSIS — L300 Nummular dermatitis: Secondary | ICD-10-CM | POA: Diagnosis not present

## 2018-11-14 DIAGNOSIS — L821 Other seborrheic keratosis: Secondary | ICD-10-CM | POA: Diagnosis not present

## 2018-11-14 DIAGNOSIS — D2262 Melanocytic nevi of left upper limb, including shoulder: Secondary | ICD-10-CM | POA: Diagnosis not present

## 2018-11-14 DIAGNOSIS — D2261 Melanocytic nevi of right upper limb, including shoulder: Secondary | ICD-10-CM | POA: Diagnosis not present

## 2018-11-14 DIAGNOSIS — D2271 Melanocytic nevi of right lower limb, including hip: Secondary | ICD-10-CM | POA: Diagnosis not present

## 2018-11-28 ENCOUNTER — Ambulatory Visit: Payer: Medicare Other | Admitting: Certified Registered"

## 2018-11-28 ENCOUNTER — Encounter: Payer: Self-pay | Admitting: *Deleted

## 2018-11-28 ENCOUNTER — Ambulatory Visit
Admission: RE | Admit: 2018-11-28 | Discharge: 2018-11-28 | Disposition: A | Discharge status: Home / Self Care | Payer: Medicare Other | Source: Ambulatory Visit | Attending: Ophthalmology | Admitting: Ophthalmology

## 2018-11-28 ENCOUNTER — Encounter
Admission: RE | Disposition: A | Discharge status: Home / Self Care | Payer: Self-pay | Source: Ambulatory Visit | Attending: Ophthalmology

## 2018-11-28 ENCOUNTER — Other Ambulatory Visit: Payer: Self-pay

## 2018-11-28 DIAGNOSIS — I10 Essential (primary) hypertension: Secondary | ICD-10-CM | POA: Diagnosis not present

## 2018-11-28 DIAGNOSIS — Z7982 Long term (current) use of aspirin: Secondary | ICD-10-CM | POA: Diagnosis not present

## 2018-11-28 DIAGNOSIS — Z87891 Personal history of nicotine dependence: Secondary | ICD-10-CM | POA: Insufficient documentation

## 2018-11-28 DIAGNOSIS — Z96652 Presence of left artificial knee joint: Secondary | ICD-10-CM | POA: Diagnosis not present

## 2018-11-28 DIAGNOSIS — H59212 Accidental puncture and laceration of left eye and adnexa during an ophthalmic procedure: Secondary | ICD-10-CM | POA: Diagnosis not present

## 2018-11-28 DIAGNOSIS — F418 Other specified anxiety disorders: Secondary | ICD-10-CM | POA: Diagnosis not present

## 2018-11-28 DIAGNOSIS — Z85828 Personal history of other malignant neoplasm of skin: Secondary | ICD-10-CM | POA: Insufficient documentation

## 2018-11-28 DIAGNOSIS — H2102 Hyphema, left eye: Secondary | ICD-10-CM | POA: Diagnosis not present

## 2018-11-28 DIAGNOSIS — H2512 Age-related nuclear cataract, left eye: Secondary | ICD-10-CM | POA: Diagnosis not present

## 2018-11-28 DIAGNOSIS — E039 Hypothyroidism, unspecified: Secondary | ICD-10-CM | POA: Diagnosis not present

## 2018-11-28 DIAGNOSIS — Y838 Other surgical procedures as the cause of abnormal reaction of the patient, or of later complication, without mention of misadventure at the time of the procedure: Secondary | ICD-10-CM | POA: Diagnosis not present

## 2018-11-28 DIAGNOSIS — Z79899 Other long term (current) drug therapy: Secondary | ICD-10-CM | POA: Insufficient documentation

## 2018-11-28 DIAGNOSIS — Z79890 Hormone replacement therapy (postmenopausal): Secondary | ICD-10-CM | POA: Diagnosis not present

## 2018-11-28 HISTORY — PX: ANTERIOR VITRECTOMY: SHX1173

## 2018-11-28 HISTORY — PX: CATARACT EXTRACTION W/PHACO: SHX586

## 2018-11-28 SURGERY — PHACOEMULSIFICATION, CATARACT, WITH IOL INSERTION
Anesthesia: Monitor Anesthesia Care | Site: Eye | Laterality: Left

## 2018-11-28 MED ORDER — MOXIFLOXACIN HCL 0.5 % OP SOLN: 1.0000 [drp] | OPHTHALMIC | Status: DC | PRN

## 2018-11-28 MED ORDER — TETRACAINE HCL 0.5 % OP SOLN
1.0000 [drp] | OPHTHALMIC | Status: AC | PRN
  Administered 2018-11-28 (×3): 1 [drp] via OPHTHALMIC

## 2018-11-28 MED ORDER — CARBACHOL 0.01 % IO SOLN
INTRAOCULAR | Status: DC | PRN
  Administered 2018-11-28: 0.5 mL via INTRAOCULAR

## 2018-11-28 MED ORDER — POVIDONE-IODINE 5 % OP SOLN
OPHTHALMIC | Status: AC
  Filled 2018-11-28: qty 60

## 2018-11-28 MED ORDER — DORZOLAMIDE HCL-TIMOLOL MAL 2-0.5 % OP SOLN
OPHTHALMIC | Status: DC | PRN
  Administered 2018-11-28: 1 [drp] via OPHTHALMIC

## 2018-11-28 MED ORDER — MOXIFLOXACIN HCL 0.5 % OP SOLN
OPHTHALMIC | Status: AC
  Filled 2018-11-28: qty 3

## 2018-11-28 MED ORDER — ACETAZOLAMIDE SODIUM 500 MG IJ SOLR
INTRAMUSCULAR | Status: AC
  Filled 2018-11-28: qty 500

## 2018-11-28 MED ORDER — TRIAMCINOLONE ACETONIDE 40 MG/ML IJ SUSP
INTRAMUSCULAR | Status: DC | PRN
  Administered 2018-11-28: 40 mg

## 2018-11-28 MED ORDER — NA HYALUR & NA CHOND-NA HYALUR 0.55-0.5 ML IO KIT
PACK | INTRAOCULAR | Status: DC | PRN
  Administered 2018-11-28 (×3): 1 via OPHTHALMIC

## 2018-11-28 MED ORDER — EPINEPHRINE PF 1 MG/ML IJ SOLN
INTRAOCULAR | Status: DC | PRN
  Administered 2018-11-28: 07:00:00 via OPHTHALMIC

## 2018-11-28 MED ORDER — DORZOLAMIDE HCL-TIMOLOL MAL 2-0.5 % OP SOLN
OPHTHALMIC | Status: AC
  Filled 2018-11-28: qty 10

## 2018-11-28 MED ORDER — TRYPAN BLUE 0.06 % OP SOLN
OPHTHALMIC | Status: AC
  Filled 2018-11-28: qty 0.5

## 2018-11-28 MED ORDER — SODIUM CHLORIDE 0.9 % IV SOLN
INTRAVENOUS | Status: DC
  Administered 2018-11-28: 07:00:00 via INTRAVENOUS

## 2018-11-28 MED ORDER — EPINEPHRINE PF 1 MG/ML IJ SOLN
INTRAMUSCULAR | Status: AC
  Filled 2018-11-28: qty 2

## 2018-11-28 MED ORDER — ARMC OPHTHALMIC DILATING DROPS
OPHTHALMIC | Status: AC
  Filled 2018-11-28: qty 0.5

## 2018-11-28 MED ORDER — BSS IO SOLN
INTRAOCULAR | Status: DC | PRN
  Administered 2018-11-28 (×2): 15 mL via INTRAOCULAR

## 2018-11-28 MED ORDER — LIDOCAINE HCL (PF) 4 % IJ SOLN
INTRAMUSCULAR | Status: AC
  Filled 2018-11-28: qty 5

## 2018-11-28 MED ORDER — TETRACAINE HCL 0.5 % OP SOLN
OPHTHALMIC | Status: AC
  Administered 2018-11-28: 1 [drp] via OPHTHALMIC
  Filled 2018-11-28: qty 4

## 2018-11-28 MED ORDER — PHENYLEPHRINE HCL 10 MG/ML IJ SOLN
INTRAMUSCULAR | Status: AC
  Filled 2018-11-28: qty 1

## 2018-11-28 MED ORDER — NA HYALUR & NA CHOND-NA HYALUR 0.55-0.5 ML IO KIT
PACK | INTRAOCULAR | Status: AC
  Filled 2018-11-28: qty 1.05

## 2018-11-28 MED ORDER — LIDOCAINE HCL (PF) 4 % IJ SOLN
INTRAOCULAR | Status: DC | PRN
  Administered 2018-11-28: 4 mL via OPHTHALMIC

## 2018-11-28 MED ORDER — FENTANYL CITRATE (PF) 100 MCG/2ML IJ SOLN: 25.0000 ug | INTRAMUSCULAR | Status: DC | PRN

## 2018-11-28 MED ORDER — ARMC OPHTHALMIC DILATING DROPS
1.0000 "application " | OPHTHALMIC | Status: AC
  Administered 2018-11-28 (×3): 1 via OPHTHALMIC

## 2018-11-28 MED ORDER — NEOMYCIN-POLYMYXIN-DEXAMETH 3.5-10000-0.1 OP OINT
TOPICAL_OINTMENT | OPHTHALMIC | Status: AC
  Filled 2018-11-28: qty 3.5

## 2018-11-28 MED ORDER — MIDAZOLAM HCL 5 MG/5ML IJ SOLN
INTRAMUSCULAR | Status: DC | PRN
  Administered 2018-11-28 (×2): 0.5 mg via INTRAVENOUS

## 2018-11-28 MED ORDER — MOXIFLOXACIN HCL 0.5 % OP SOLN
OPHTHALMIC | Status: DC | PRN
  Administered 2018-11-28: 0.2 mL via OPHTHALMIC

## 2018-11-28 MED ORDER — TRIAMCINOLONE ACETONIDE 40 MG/ML IJ SUSP
INTRAMUSCULAR | Status: AC
  Filled 2018-11-28: qty 1

## 2018-11-28 MED ORDER — NEOMYCIN-POLYMYXIN-DEXAMETH 3.5-10000-0.1 OP OINT
TOPICAL_OINTMENT | OPHTHALMIC | Status: DC | PRN
  Administered 2018-11-28: 1 via OPHTHALMIC

## 2018-11-28 MED ORDER — POVIDONE-IODINE 5 % OP SOLN
OPHTHALMIC | Status: DC | PRN
  Administered 2018-11-28 (×2): 1 via OPHTHALMIC

## 2018-11-28 MED ORDER — ACETAZOLAMIDE SODIUM 500 MG IJ SOLR
INTRAMUSCULAR | Status: DC | PRN
  Administered 2018-11-28: 500 mg via INTRAVENOUS

## 2018-11-28 MED ORDER — NA CHONDROIT SULF-NA HYALURON 40-30 MG/ML IO SOLN
INTRAOCULAR | Status: DC | PRN
  Administered 2018-11-28: 0.5 mL via INTRAOCULAR

## 2018-11-28 MED ORDER — FENTANYL CITRATE (PF) 100 MCG/2ML IJ SOLN
INTRAMUSCULAR | Status: AC
  Filled 2018-11-28: qty 2

## 2018-11-28 MED ORDER — FENTANYL CITRATE (PF) 100 MCG/2ML IJ SOLN
INTRAMUSCULAR | Status: DC | PRN
  Administered 2018-11-28 (×4): 25 ug via INTRAVENOUS

## 2018-11-28 MED ORDER — NA CHONDROIT SULF-NA HYALURON 40-30 MG/ML IO SOLN
INTRAOCULAR | Status: AC
  Filled 2018-11-28: qty 0.5

## 2018-11-28 MED ORDER — MIDAZOLAM HCL 2 MG/2ML IJ SOLN
INTRAMUSCULAR | Status: AC
  Filled 2018-11-28: qty 2

## 2018-11-28 MED ORDER — TRYPAN BLUE 0.06 % OP SOLN
OPHTHALMIC | Status: DC | PRN
  Administered 2018-11-28: 0.5 mL via INTRAOCULAR

## 2018-11-28 MED ORDER — ONDANSETRON HCL 4 MG/2ML IJ SOLN: 4.0000 mg | Freq: Once | INTRAMUSCULAR | Status: DC | PRN

## 2018-11-28 SURGICAL SUPPLY — 26 items
BNDG EYE OVAL (GAUZE/BANDAGES/DRESSINGS) ×12 IMPLANT
DISSECTOR HYDRO NUCLEUS 50X22 (MISCELLANEOUS) ×12 IMPLANT
GLOVE BIOGEL M 6.5 STRL (GLOVE) ×3 IMPLANT
GOWN STRL REUS W/ TWL LRG LVL3 (GOWN DISPOSABLE) ×1 IMPLANT
GOWN STRL REUS W/ TWL XL LVL3 (GOWN DISPOSABLE) ×1 IMPLANT
GOWN STRL REUS W/TWL LRG LVL3 (GOWN DISPOSABLE) ×2
GOWN STRL REUS W/TWL XL LVL3 (GOWN DISPOSABLE) ×2
KNIFE 45D UP 2.3 (MISCELLANEOUS) ×3 IMPLANT
LABEL CATARACT MEDS ST (LABEL) ×3 IMPLANT
LENS IOL ACRSF IQ TRC 4 20.0 IMPLANT
LENS IOL ACRSF MP 19.0 (Intraocular Lens) ×1 IMPLANT
LENS IOL ACRYSOF IQ TORIC 20.0 IMPLANT
LENS IOL ACRYSOF POST 19.0 (Intraocular Lens) ×3 IMPLANT
LENS IOL IQ TORIC 4 20.0 IMPLANT
PACK CATARACT (MISCELLANEOUS) ×3 IMPLANT
PACK CATARACT KING (MISCELLANEOUS) ×3 IMPLANT
PACK EYE AFTER SURG (MISCELLANEOUS) ×3 IMPLANT
PACK VIT ANT 23G (MISCELLANEOUS) ×3 IMPLANT
SHIELD EYE LENSE ONLY DISP (GAUZE/BANDAGES/DRESSINGS) ×3 IMPLANT
SOL BSS BAG (MISCELLANEOUS) ×3
SOLUTION BSS BAG (MISCELLANEOUS) ×1 IMPLANT
SPEAR FASTAKII (SLEEVE) ×9 IMPLANT
SUT ETHILON 10 0 CS140 6 (SUTURE) ×3 IMPLANT
SYR 5ML LL (SYRINGE) ×3 IMPLANT
WATER STERILE IRR 250ML POUR (IV SOLUTION) ×3 IMPLANT
WIPE NON LINTING 3.25X3.25 (MISCELLANEOUS) ×6 IMPLANT

## 2018-11-28 NOTE — Anesthesia Post-op Follow-up Note (Signed)
Anesthesia QCDR form completed.        

## 2018-11-28 NOTE — Discharge Instructions (Signed)
Eye Surgery Discharge Instructions    Expect mild scratchy sensation or mild soreness. DO NOT RUB YOUR EYE!  The day of surgery:  Minimal physical activity, but bed rest is not required  No reading, computer work, or close hand work  No bending, lifting, or straining.  May watch TV  For 24 hours:  No driving, legal decisions, or alcoholic beverages  Safety precautions  Eat anything you prefer: It is better to start with liquids, then soup then solid foods.  _____ Eye patch should be worn until postoperative exam tomorrow.  ____ Solar shield eyeglasses should be worn for comfort in the sunlight/patch while sleeping  Resume all regular medications including aspirin or Coumadin if these were discontinued prior to surgery. You may shower, bathe, shave, or wash your hair. Tylenol may be taken for mild discomfort.  Call your doctor if you experience significant pain, nausea, or vomiting, fever > 101 or other signs of infection. [phone] or [phone] Specific instructions:  Follow-up Information    Marchia Meiers, MD Follow up.   Specialty:  Ophthalmology Why:  December 6 at 9:30am Contact information: [address] [place] [address] [phone]

## 2018-11-28 NOTE — Op Note (Signed)
  PREOPERATIVE DIAGNOSIS:  Nuclear sclerotic cataract of the LEFT eye.   POSTOPERATIVE DIAGNOSIS:  Nuclear sclerotic cataract of the LEFT eye.   OPERATIVE PROCEDURE: Cataract surgery OS   SURGEON:  Marchia Meiers, MD.   ANESTHESIA:  Anesthesiologist: Alvin Critchley, MD CRNA: Silvana Newness, CRNA  1.      Managed anesthesia care. 2.     0.97ml of Shugarcaine was instilled following the paracentesis   COMPLICATIONS:  Posterior capsule tear; placement of sulcus IOL; anterior vitrectomy; hyphema   TECHNIQUE:   Divide and conquer   DESCRIPTION OF PROCEDURE:  The patient was examined and consented in the preoperative holding area where the aforementioned topical anesthesia was applied to the LEFT eye and then brought back to the Operating Room where the left eye was prepped and draped in the usual sterile ophthalmic fashion and a lid speculum was placed. A paracentesis was created with the side port blade, the anterior chamber was washed out with trypan blue to stain the anterior capsule, and the anterior chamber was filled with viscoelastic. A near clear corneal incision was performed with the steel keratome. A continuous curvilinear capsulorrhexis was performed with a cystotome followed by the capsulorrhexis forceps. Hydrodissection and hydrodelineation were carried out with BSS on a blunt cannula. The lens was removed in a divide and conquer  technique and the remaining cortical material was removed with the irrigation-aspiration handpiece. The capsular bag was inflated with viscoelastic.  At this point, a posterior capsular tear was noted from approximately 12:00 to 2:00. Provisc was injected to tamponade the vitreous. The main wound was enlarged to approximately 2.8mm, and the 19.0 sulcus IOL was injected into the anterior chamber. The IOL was re-positioned so that the haptics were in the sulcus. A hyphema was noted. Anterior vitrectomy was performed to remove blood from the [facility], as well as vitreous  in the [facility] that we had stained with triescence. At the conclusion of this, the lens was well placed, bleeding had stopped, and no additional vitreous was seen in the [facility]. A 10-0 nylon suture was placed in the main wound. The wounds were hydrated. The anterior chamber was flushed and the eye was inflated to physiologic pressure. 0.39ml Vigamox was placed in the anterior chamber. The wounds were found to be water tight. The eye was dressed with Maxitrol ointment and a pressure patch was placed.  The patient was discharged in good condition and will follow up at [facility] tomorrow. Implant Name Type Inv. Item Serial No. Manufacturer Lot No. LRB No. Used  LENS IOL TORIC 20.0 - [id number]  LENS IOL TORIC 20.0 [id number] ALCON  Left 1  LENS IOL POST 19.0 - [id number] Intraocular Lens LENS IOL POST 19.0 [id number] ALCON  Left 1    Procedure(s) with comments: CATARACT EXTRACTION PHACO AND INTRAOCULAR LENS PLACEMENT (IOC)-LEFT (Left) - [place] 00:47.7 CDE 8.92 Fluid Pack lot # [id number] ANTERIOR VITRECTOMY (Left)  Electronically signed: Marchia Meiers 11/28/2018 9:51 AM

## 2018-11-28 NOTE — Anesthesia Preprocedure Evaluation (Signed)
Anesthesia Evaluation  Patient identified by MRN, date of birth, ID band Patient awake    Reviewed: Allergy & Precautions, NPO status , Patient's Chart, lab work & pertinent test results  History of Anesthesia Complications Negative for: history of anesthetic complications  Airway Mallampati: II  TM Distance: >3 FB Neck ROM: Full    Dental no notable dental hx.    Pulmonary neg sleep apnea, neg COPD, former smoker,    breath sounds clear to auscultation- rhonchi (-) wheezing      Cardiovascular Exercise Tolerance: Good hypertension, Pt. on medications (-) Past MI, (-) Cardiac Stents and (-) CABG + dysrhythmias Atrial Fibrillation  Rhythm:Regular Rate:Normal - Systolic murmurs and - Diastolic murmurs    Neuro/Psych PSYCHIATRIC DISORDERS Anxiety Depression negative neurological ROS     GI/Hepatic Neg liver ROS, GERD  ,  Endo/Other  neg diabetesHypothyroidism   Renal/GU negative Renal ROS     Musculoskeletal  (+) Arthritis ,   Abdominal (+) - obese,   Peds  Hematology   Anesthesia Other Findings Past Medical History: No date: Allergy No date: Anxiety No date: Arthritis 10/20/2016: Cancer ([place])     Comment:  Basal Cell Skin Cancer No date: Depression No date: Dysrhythmia     Comment:  A FIB No date: GERD (gastroesophageal reflux disease) No date: HOH (hard of hearing)     Comment:  Bilateral No date: Hyperlipidemia No date: Hypertension     Comment:  Patient denies No date: Hypothyroidism No date: IBS (irritable bowel syndrome) No date: Thyroid disease No date: Tinnitus of both ears   Reproductive/Obstetrics                             Anesthesia Physical  Anesthesia Plan  ASA: III  Anesthesia Plan: MAC   Post-op Pain Management:    Induction: Intravenous  PONV Risk Score and Plan: 1 and Midazolam  Airway Management Planned: Natural Airway  Additional Equipment:    Intra-op Plan:   Post-operative Plan:   Informed Consent: I have reviewed the patients History and Physical, chart, labs and discussed the procedure including the risks, benefits and alternatives for the proposed anesthesia with the patient or authorized representative who has indicated his/her understanding and acceptance.     Plan Discussed with: CRNA and Anesthesiologist  Anesthesia Plan Comments:         Anesthesia Quick Evaluation

## 2018-11-28 NOTE — Anesthesia Postprocedure Evaluation (Signed)
Anesthesia Post Note  Patient: Gary Mueller  Procedure(s) Performed: CATARACT EXTRACTION PHACO AND INTRAOCULAR LENS PLACEMENT (IOC)-LEFT (Left Eye) ANTERIOR VITRECTOMY (Left Eye)  Patient location during evaluation: PACU Anesthesia Type: MAC Level of consciousness: awake and alert Pain management: pain level controlled Vital Signs Assessment: post-procedure vital signs reviewed and stable Respiratory status: spontaneous breathing, nonlabored ventilation, respiratory function stable and patient connected to nasal cannula oxygen Cardiovascular status: stable and blood pressure returned to baseline Postop Assessment: no apparent nausea or vomiting Anesthetic complications: no     Last Vitals:  Vitals:   11/28/18 0830 11/28/18 0841  BP: (!) (P) 136/46 (!) 136/46  Pulse:  (!) 51  Resp: (P) 16   Temp: (!) (P) 36.2 C   SpO2: (P) 99%     Last Pain:  Vitals:   11/28/18 0622  TempSrc: Oral  PainSc: 0-No pain                 Silvana Newness A

## 2018-11-28 NOTE — H&P (Signed)
   I have reviewed the patient's H&P and agree with its findings. There have been no interval changes.  Toryn Mcclinton MD Ophthalmology 

## 2018-11-28 NOTE — Transfer of Care (Signed)
Immediate Anesthesia Transfer of Care Note  Patient: Gary Mueller  Procedure(s) Performed: CATARACT EXTRACTION PHACO AND INTRAOCULAR LENS PLACEMENT (IOC)-LEFT (Left Eye) ANTERIOR VITRECTOMY (Left Eye)  Patient Location: PACU  Anesthesia Type:MAC  Level of Consciousness: awake, alert , oriented and patient cooperative  Airway & Oxygen Therapy: Patient Spontanous Breathing  Post-op Assessment: Report given to RN, Post -op Vital signs reviewed and stable and Patient moving all extremities X 4  Post vital signs: Reviewed and stable  Last Vitals:  Vitals Value Taken Time  BP 136/46 11/28/2018  8:41 AM  Temp    Pulse 51 11/28/2018  8:41 AM  Resp    SpO2      Last Pain:  Vitals:   11/28/18 0622  TempSrc: Oral  PainSc: 0-No pain         Complications: No apparent anesthesia complications

## 2018-11-28 NOTE — OR Nursing (Signed)
Discharge instructions discussed with pt and daughter. Both voice understanding. 

## 2018-12-02 DIAGNOSIS — Z961 Presence of intraocular lens: Secondary | ICD-10-CM | POA: Diagnosis not present

## 2018-12-02 DIAGNOSIS — H4312 Vitreous hemorrhage, left eye: Secondary | ICD-10-CM | POA: Diagnosis not present

## 2018-12-03 ENCOUNTER — Other Ambulatory Visit: Payer: Self-pay | Admitting: Family Medicine

## 2018-12-03 DIAGNOSIS — I10 Essential (primary) hypertension: Secondary | ICD-10-CM

## 2018-12-09 DIAGNOSIS — H5989 Other postprocedural complications and disorders of eye and adnexa, not elsewhere classified: Secondary | ICD-10-CM | POA: Diagnosis not present

## 2018-12-30 DIAGNOSIS — H5989 Other postprocedural complications and disorders of eye and adnexa, not elsewhere classified: Secondary | ICD-10-CM | POA: Diagnosis not present

## 2019-01-02 DIAGNOSIS — Z961 Presence of intraocular lens: Secondary | ICD-10-CM | POA: Diagnosis not present

## 2019-01-07 ENCOUNTER — Ambulatory Visit (INDEPENDENT_AMBULATORY_CARE_PROVIDER_SITE_OTHER): Payer: Medicare Other | Admitting: Family Medicine

## 2019-01-07 ENCOUNTER — Encounter: Payer: Self-pay | Admitting: Family Medicine

## 2019-01-07 VITALS — BP 140/70 | HR 61 | Temp 97.7°F | Resp 16 | Wt 179.8 lb

## 2019-01-07 DIAGNOSIS — J0100 Acute maxillary sinusitis, unspecified: Secondary | ICD-10-CM

## 2019-01-07 MED ORDER — AMOXICILLIN 875 MG PO TABS: 875.0000 mg | ORAL_TABLET | Freq: Two times a day (BID) | ORAL | 0 refills | Status: DC

## 2019-01-07 MED ORDER — SERTRALINE HCL 50 MG PO TABS: 25.0000 mg | ORAL_TABLET | Freq: Every day | ORAL | 0 refills | Status: DC

## 2019-01-07 NOTE — Progress Notes (Signed)
Patient: Gary Mueller Male    DOB: Nov 06, 1941   78 y.o.   MRN: [id number] Visit Date: 01/07/2019  Today's Provider: Vernie Murders, PA   Chief Complaint  Patient presents with  . Sinusitis   Subjective:     Sinusitis  This is a new problem. The current episode started in the past 7 days (Sunday evening). The problem has been gradually worsening since onset. There has been no fever. The pain is moderate. Associated symptoms include congestion, headaches (left side), sinus pressure and sneezing. Pertinent negatives include no chills, coughing, ear pain, shortness of breath, sore throat or swollen glands. (Pain on left side of face) Past treatments include spray decongestants. The treatment provided no relief.   Past Medical History:  Diagnosis Date  . Allergy   . Anxiety   . Arthritis   . Cancer ([place]) 10/20/2016   Basal Cell Skin Cancer  . Depression   . Dysrhythmia    A FIB  . GERD (gastroesophageal reflux disease)   . HOH (hard of hearing)    Bilateral  . Hyperlipidemia   . Hypertension    Patient denies  . Hypothyroidism   . IBS (irritable bowel syndrome)   . Thyroid disease   . Tinnitus of both ears    Past Surgical History:  Procedure Laterality Date  . ANTERIOR VITRECTOMY Left 11/28/2018   Procedure: ANTERIOR VITRECTOMY;  Surgeon: Marchia Meiers, MD;  Location: [facility] ORS;  Service: Ophthalmology;  Laterality: Left;  . CATARACT EXTRACTION W/PHACO Right 10/10/2018   Procedure: CATARACT EXTRACTION PHACO AND INTRAOCULAR LENS PLACEMENT ([place]);  Surgeon: Marchia Meiers, MD;  Location: [facility] ORS;  Service: Ophthalmology;  Laterality: Right;  [place] 00:41 CDE 6.87 Fluid pack lot # [id number]  . CATARACT EXTRACTION W/PHACO Left 11/28/2018   Procedure: CATARACT EXTRACTION PHACO AND INTRAOCULAR LENS PLACEMENT (IOC)-LEFT;  Surgeon: Marchia Meiers, MD;  Location: [facility] ORS;  Service: Ophthalmology;  Laterality: Left;  [place] 00:47.7 CDE 8.92 Fluid Pack lot # [id number]  .  CHOLECYSTECTOMY  1968  . CHONDROPLASTY Right 01/02/2018   Procedure: CHONDROPLASTY;  Surgeon: Dereck Leep, MD;  Location: [facility] ORS;  Service: Orthopedics;  Laterality: Right;  . COLONOSCOPY  2014  . HERNIA REPAIR Left 04/18/1996   inguinal hernia repair/ Dr Bary Castilla  . HERNIA REPAIR Right 02/26/1996   Dr Bary Castilla  . HERNIA REPAIR Left 08/07/2002   Dr Bary Castilla  . JOINT REPLACEMENT    . KNEE ARTHROSCOPY Right 01/02/2018   Procedure: ARTHROSCOPY KNEE;  Surgeon: Dereck Leep, MD;  Location: [facility] ORS;  Service: Orthopedics;  Laterality: Right;  . KNEE ARTHROSCOPY WITH MEDIAL MENISECTOMY Right 01/02/2018   Procedure: KNEE ARTHROSCOPY WITH MEDIAL MENISECTOMY;  Surgeon: Dereck Leep, MD;  Location: [facility] ORS;  Service: Orthopedics;  Laterality: Right;  . TONSILLECTOMY     as a child  . TOTAL KNEE ARTHROPLASTY Left 03/29/2015   [facility] Dr. Marry Guan   Family History  Problem Relation Age of Onset  . Heart disease Mother        A fib  . Healthy Sister     Allergies  Allergen Reactions  . Codeine     GI intolerance; hallucinations   . Statins     Muscle pain    Current Outpatient Medications:  .  acetaminophen (TYLENOL) 500 MG tablet, Take 1,000 mg by mouth daily as needed for moderate pain., Disp: , Rfl:  .  amLODipine (NORVASC) 5 MG tablet, TAKE ONE TABLET BY MOUTH EVERY DAY,  Disp: 90 tablet, Rfl: 3 .  Ascorbic Acid (VITAMIN C) 1000 MG tablet, Take 1,000 mg by mouth every evening. , Disp: , Rfl:  .  aspirin EC 81 MG tablet, Take 81 mg by mouth every evening. , Disp: , Rfl:  .  cholecalciferol (VITAMIN D3) 25 MCG (1000 UT) tablet, Take 1,000 Units by mouth every evening., Disp: , Rfl:  .  diclofenac sodium (VOLTAREN) 1 % GEL, Apply 1 application topically as needed (knee pain)., Disp: , Rfl:  .  ezetimibe (ZETIA) 10 MG tablet, Take 1 tablet (10 mg total) by mouth daily., Disp: 30 tablet, Rfl: 12 .  levothyroxine (SYNTHROID, LEVOTHROID) 100 MCG tablet, TAKE ONE TABLET BY MOUTH EVERY DAY,  Disp: 90 tablet, Rfl: 3 .  Multiple Vitamin (MULTIVITAMIN WITH MINERALS) TABS tablet, Take 1 tablet by mouth every evening. , Disp: , Rfl:   Review of Systems  Constitutional: Negative for chills and fever.  HENT: Positive for congestion, postnasal drip, rhinorrhea, sinus pressure, sinus pain (left side) and sneezing. Negative for ear discharge, ear pain, facial swelling and sore throat.   Respiratory: Negative for cough, chest tightness, shortness of breath and wheezing.   Cardiovascular: Negative for chest pain, palpitations and leg swelling.  Neurological: Positive for headaches (left side).   Social History   Tobacco Use  . Smoking status: Former Smoker    Packs/day: 1.50    Types: Cigarettes    Last attempt to quit: 12/24/1964    Years since quitting: 54.0  . Smokeless tobacco: Never Used  Substance Use Topics  . Alcohol use: Not Currently    Frequency: Never    Comment: wine rare     Objective:   BP 140/70 (BP Location: Right Arm, Patient Position: Sitting, Cuff Size: Large)   Pulse 61   Temp 97.7 F (36.5 C) (Oral)   Resp 16   Wt 179 lb 12.8 oz (81.6 kg)   SpO2 97%   BMI 23.72 kg/m  Vitals:   01/07/19 1056  BP: 140/70  Pulse: 61  Resp: 16  Temp: 97.7 F (36.5 C)  TempSrc: Oral  SpO2: 97%  Weight: 179 lb 12.8 oz (81.6 kg)   Physical Exam Constitutional:      General: He is not in acute distress.    Appearance: He is well-developed.  HENT:     Head: Normocephalic and atraumatic.     Right Ear: Hearing and tympanic membrane normal.     Left Ear: Hearing and tympanic membrane normal.     Ears:     Comments: Wears hearing aids bilaterally.    Nose: Nose normal.     Comments: Tender maxillary sinuses to palpation and percussion (L>R). Poor transillumination.    Mouth/Throat:     Pharynx: Oropharynx is clear.  Eyes:     General: Lids are normal. No scleral icterus.       Right eye: No discharge.        Left eye: No discharge.     Conjunctiva/sclera:  Conjunctivae normal.  Neck:     Musculoskeletal: Normal range of motion and neck supple.  Cardiovascular:     Rate and Rhythm: Normal rate and regular rhythm.     Heart sounds: Normal heart sounds.  Pulmonary:     Effort: Pulmonary effort is normal. No respiratory distress.     Breath sounds: Normal breath sounds.  Musculoskeletal: Normal range of motion.  Skin:    Findings: No lesion or rash.  Neurological:     Mental  Status: He is alert and oriented to person, place, and time.  Psychiatric:        Speech: Speech normal.        Behavior: Behavior normal.        Thought Content: Thought content normal.       Assessment & Plan    1. Acute maxillary sinusitis, recurrence not specified Onset 01-05-19 with rhinorrhea, sneezing, headache and stuffy head. No fever or cough. Will treat sinusitis with Amoxil, Mucinex and Flonase. May use Tylenol prn discomfort. Recheck if no better in 5-7 days. - amoxicillin (AMOXIL) 875 MG tablet; Take 1 tablet (875 mg total) by mouth 2 (two) times daily.  Dispense: 20 tablet; Refill: [place], [place]  [facility]

## 2019-01-07 NOTE — Patient Instructions (Signed)
Sinusitis, Adult  Sinusitis is inflammation of your sinuses. Sinuses are hollow spaces in the bones around your face. Your sinuses are located:   Around your eyes.   In the middle of your forehead.   Behind your nose.   In your cheekbones.  Mucus normally drains out of your sinuses. When your nasal tissues become inflamed or swollen, mucus can become trapped or blocked. This allows bacteria, viruses, and fungi to grow, which leads to infection. Most infections of the sinuses are caused by a virus.  Sinusitis can develop quickly. It can last for up to 4 weeks (acute) or for more than 12 weeks (chronic). Sinusitis often develops after a cold.  What are the causes?  This condition is caused by anything that creates swelling in the sinuses or stops mucus from draining. This includes:   Allergies.   Asthma.   Infection from bacteria or viruses.   Deformities or blockages in your nose or sinuses.   Abnormal growths in the nose (nasal polyps).   Pollutants, such as chemicals or irritants in the air.   Infection from fungi (rare).  What increases the risk?  You are more likely to develop this condition if you:   Have a weak body defense system (immune system).   Do a lot of swimming or diving.   Overuse nasal sprays.   Smoke.  What are the signs or symptoms?  The main symptoms of this condition are pain and a feeling of pressure around the affected sinuses. Other symptoms include:   Stuffy nose or congestion.   Thick drainage from your nose.   Swelling and warmth over the affected sinuses.   Headache.   Upper toothache.   A cough that may get worse at night.   Extra mucus that collects in the throat or the back of the nose (postnasal drip).   Decreased sense of smell and taste.   Fatigue.   A fever.   Sore throat.   Bad breath.  How is this diagnosed?  This condition is diagnosed based on:   Your symptoms.   Your medical history.   A physical exam.   Tests to find out if your condition is  acute or chronic. This may include:  ? Checking your nose for nasal polyps.  ? Viewing your sinuses using a device that has a light (endoscope).  ? Testing for allergies or bacteria.  ? Imaging tests, such as an MRI or CT scan.  In rare cases, a bone biopsy may be done to rule out more serious types of fungal sinus disease.  How is this treated?  Treatment for sinusitis depends on the cause and whether your condition is chronic or acute.   If caused by a virus, your symptoms should go away on their own within 10 days. You may be given medicines to relieve symptoms. They include:  ? Medicines that shrink swollen nasal passages (topical intranasal decongestants).  ? Medicines that treat allergies (antihistamines).  ? A spray that eases inflammation of the nostrils (topical intranasal corticosteroids).  ? Rinses that help get rid of thick mucus in your nose (nasal saline washes).   If caused by bacteria, your health care provider may recommend waiting to see if your symptoms improve. Most bacterial infections will get better without antibiotic medicine. You may be given antibiotics if you have:  ? A severe infection.  ? A weak immune system.   If caused by narrow nasal passages or nasal polyps, you may need   to have surgery.  Follow these instructions at home:  Medicines   Take, use, or apply over-the-counter and prescription medicines only as told by your health care provider. These may include nasal sprays.   If you were prescribed an antibiotic medicine, take it as told by your health care provider. Do not stop taking the antibiotic even if you start to feel better.  Hydrate and humidify     Drink enough fluid to keep your urine pale yellow. Staying hydrated will help to thin your mucus.   Use a cool mist humidifier to keep the humidity level in your home above 50%.   Inhale steam for 10-15 minutes, 3-4 times a day, or as told by your health care provider. You can do this in the bathroom while a hot shower is  running.   Limit your exposure to cool or dry air.  Rest   Rest as much as possible.   Sleep with your head raised (elevated).   Make sure you get enough sleep each night.  General instructions     Apply a warm, moist washcloth to your face 3-4 times a day or as told by your health care provider. This will help with discomfort.   Wash your hands often with soap and water to reduce your exposure to germs. If soap and water are not available, use hand sanitizer.   Do not smoke. Avoid being around people who are smoking (secondhand smoke).   Keep all follow-up visits as told by your health care provider. This is important.  Contact a health care provider if:   You have a fever.   Your symptoms get worse.   Your symptoms do not improve within 10 days.  Get help right away if:   You have a severe headache.   You have persistent vomiting.   You have severe pain or swelling around your face or eyes.   You have vision problems.   You develop confusion.   Your neck is stiff.   You have trouble breathing.  Summary   Sinusitis is soreness and inflammation of your sinuses. Sinuses are hollow spaces in the bones around your face.   This condition is caused by nasal tissues that become inflamed or swollen. The swelling traps or blocks the flow of mucus. This allows bacteria, viruses, and fungi to grow, which leads to infection.   If you were prescribed an antibiotic medicine, take it as told by your health care provider. Do not stop taking the antibiotic even if you start to feel better.   Keep all follow-up visits as told by your health care provider. This is important.  This information is not intended to replace advice given to you by your health care provider. Make sure you discuss any questions you have with your health care provider.  Document Released: 12/11/2005 Document Revised: 05/13/2018 Document Reviewed: 05/13/2018  Elsevier Interactive Patient Education  2019 [employer]

## 2019-01-30 ENCOUNTER — Ambulatory Visit: Payer: Medicare Other

## 2019-01-30 ENCOUNTER — Telehealth: Payer: Self-pay | Admitting: Family Medicine

## 2019-01-30 NOTE — Telephone Encounter (Signed)
I left a message on both the home and mobile numbers explaining that we need to reschedule his AWV appointment because McKenzie is out of the office today. VDM (DD)

## 2019-02-07 ENCOUNTER — Ambulatory Visit (INDEPENDENT_AMBULATORY_CARE_PROVIDER_SITE_OTHER): Payer: Medicare Other

## 2019-02-07 VITALS — BP 136/60 | HR 51 | Temp 98.5°F | Ht 73.0 in | Wt 178.0 lb

## 2019-02-07 DIAGNOSIS — Z23 Encounter for immunization: Secondary | ICD-10-CM | POA: Diagnosis not present

## 2019-02-07 DIAGNOSIS — Z0000 Encounter for general adult medical examination without abnormal findings: Secondary | ICD-10-CM | POA: Diagnosis not present

## 2019-02-07 NOTE — Progress Notes (Signed)
Subjective:   Gary Mueller is a 78 y.o. male who presents for Medicare Annual/Subsequent preventive examination.  Review of Systems:  N/A  Cardiac Risk Factors include: advanced age (>65men, >8 women);dyslipidemia;hypertension;male gender     Objective:    Vitals: BP 136/60 (BP Location: Right Arm)   Pulse (!) 51   Temp 98.5 F (36.9 C) (Oral)   Ht 6\' 1"  (1.854 m)   Wt 178 lb (80.7 kg)   BMI 23.48 kg/m   Body mass index is 23.48 kg/m.  Advanced Directives 02/07/2019 10/10/2018 01/09/2018 01/02/2018 12/26/2017 12/12/2016 06/12/2016  Does Patient Have a Medical Advance Directive? Yes No - Yes Yes Yes Yes  Type of Paramedic of [place];Living will - [place];Living will [place];Living will [place];Living will Living will;Healthcare Power of Attorney Living will;Healthcare Power of Attorney  Does patient want to make changes to medical advance directive? - - - - No - Patient declined - -  Copy of [place] in Chart? No - copy requested - No - copy requested No - copy requested - Yes No - copy requested  Would patient like information on creating a medical advance directive? - No - Patient declined - - - - -    Tobacco Social History   Tobacco Use  Smoking Status Former Smoker  . Packs/day: 1.50  . Types: Cigarettes  . Last attempt to quit: 12/24/1964  . Years since quitting: 54.1  Smokeless Tobacco Never Used     Counseling given: Not Answered   Clinical Intake:  Pre-visit preparation completed: Yes  Pain : No/denies pain Pain Score: 0-No pain     Nutritional Status: BMI of 19-24  Normal Nutritional Risks: None Diabetes: No  How often do you need to have someone help you when you read instructions, pamphlets, or other written materials from your doctor or pharmacy?: 1 - Never  Interpreter Needed?: No  Information entered by :: [facility], LPN  Past Medical  History:  Diagnosis Date  . Allergy   . Anxiety   . Arthritis   . Cancer ([place]) 10/20/2016   Basal Cell Skin Cancer  . Depression   . Dysrhythmia    A FIB  . GERD (gastroesophageal reflux disease)   . HOH (hard of hearing)    Bilateral  . Hyperlipidemia   . Hypertension    Patient denies  . Hypothyroidism   . IBS (irritable bowel syndrome)   . Thyroid disease   . Tinnitus of both ears    Past Surgical History:  Procedure Laterality Date  . ANTERIOR VITRECTOMY Left 11/28/2018   Procedure: ANTERIOR VITRECTOMY;  Surgeon: Marchia Meiers, MD;  Location: [facility] ORS;  Service: Ophthalmology;  Laterality: Left;  . CATARACT EXTRACTION W/PHACO Right 10/10/2018   Procedure: CATARACT EXTRACTION PHACO AND INTRAOCULAR LENS PLACEMENT ([place]);  Surgeon: Marchia Meiers, MD;  Location: [facility] ORS;  Service: Ophthalmology;  Laterality: Right;  [place] 00:41 CDE 6.87 Fluid pack lot # [id number]  . CATARACT EXTRACTION W/PHACO Left 11/28/2018   Procedure: CATARACT EXTRACTION PHACO AND INTRAOCULAR LENS PLACEMENT ([facility])-LEFT;  Surgeon: Marchia Meiers, MD;  Location: [facility] ORS;  Service: Ophthalmology;  Laterality: Left;  [place] 00:47.7 CDE 8.92 Fluid Pack lot # [id number]  . CHOLECYSTECTOMY  1968  . CHONDROPLASTY Right 01/02/2018   Procedure: CHONDROPLASTY;  Surgeon: Dereck Leep, MD;  Location: [facility] ORS;  Service: Orthopedics;  Laterality: Right;  . COLONOSCOPY  2014  . HERNIA REPAIR Left  04/18/1996   inguinal hernia repair/ Dr Bary Castilla  . HERNIA REPAIR Right 02/26/1996   Dr Bary Castilla  . HERNIA REPAIR Left 08/07/2002   Dr Bary Castilla  . JOINT REPLACEMENT    . KNEE ARTHROSCOPY Right 01/02/2018   Procedure: ARTHROSCOPY KNEE;  Surgeon: Dereck Leep, MD;  Location: [facility] ORS;  Service: Orthopedics;  Laterality: Right;  . KNEE ARTHROSCOPY WITH MEDIAL MENISECTOMY Right 01/02/2018   Procedure: KNEE ARTHROSCOPY WITH MEDIAL MENISECTOMY;  Surgeon: Dereck Leep, MD;  Location: [facility] ORS;  Service: Orthopedics;  Laterality: Right;  .  TONSILLECTOMY     as a child  . TOTAL KNEE ARTHROPLASTY Left 03/29/2015   [facility] Dr. Marry Guan   Family History  Problem Relation Age of Onset  . Heart disease Mother        A fib  . Healthy Sister    Social History   Socioeconomic History  . Marital status: Widowed    Spouse name: Lelon Frohlich  . Number of children: 3  . Years of education: Not on file  . Highest education level: Some college, no degree  Occupational History  . Occupation: full time  Social Needs  . Financial resource strain: Not hard at all  . Food insecurity:    Worry: Never true    Inability: Never true  . Transportation needs:    Medical: No    Non-medical: No  Tobacco Use  . Smoking status: Former Smoker    Packs/day: 1.50    Types: Cigarettes    Last attempt to quit: 12/24/1964    Years since quitting: 54.1  . Smokeless tobacco: Never Used  Substance and Sexual Activity  . Alcohol use: Yes    Frequency: Never    Comment: 1 beer or glass of wine monthly  . Drug use: No  . Sexual activity: Not on file  Lifestyle  . Physical activity:    Days per week: 0 days    Minutes per session: 0 min  . Stress: Not on file  Relationships  . Social connections:    Talks on phone: Patient refused    Gets together: Patient refused    Attends religious service: Patient refused    Active member of club or organization: Patient refused    Attends meetings of clubs or organizations: Patient refused    Relationship status: Patient refused  Other Topics Concern  . Not on file  Social History Narrative  . Not on file    Outpatient Encounter Medications as of 02/07/2019  Medication Sig  . acetaminophen (TYLENOL) 500 MG tablet Take 1,000 mg by mouth daily as needed for moderate pain.  Marland Kitchen amLODipine (NORVASC) 5 MG tablet TAKE ONE TABLET BY MOUTH EVERY DAY  . Ascorbic Acid (VITAMIN C) 1000 MG tablet Take 1,000 mg by mouth every evening.   Marland Kitchen aspirin EC 81 MG tablet Take 81 mg by mouth every evening.   . cholecalciferol  (VITAMIN D3) 25 MCG (1000 UT) tablet Take 1,000 Units by mouth every evening.  . diclofenac sodium (VOLTAREN) 1 % GEL Apply 1 application topically as needed (knee pain).  Marland Kitchen ezetimibe (ZETIA) 10 MG tablet Take 1 tablet (10 mg total) by mouth daily.  Marland Kitchen levothyroxine (SYNTHROID, LEVOTHROID) 100 MCG tablet TAKE ONE TABLET BY MOUTH EVERY DAY  . Multiple Vitamin (MULTIVITAMIN WITH MINERALS) TABS tablet Take 1 tablet by mouth every evening.   . prednisoLONE acetate (PRED FORTE) 1 % ophthalmic suspension Place 1 drop into the left eye every 6 (six) hours as  needed.   . sertraline (ZOLOFT) 50 MG tablet Take 0.5 tablets (25 mg total) by mouth at bedtime.  Marland Kitchen amoxicillin (AMOXIL) 875 MG tablet Take 1 tablet (875 mg total) by mouth 2 (two) times daily. (Patient not taking: Reported on 02/07/2019)   No facility-administered encounter medications on file as of 02/07/2019.     Activities of Daily Living In your present state of health, do you have any difficulty performing the following activities: 02/07/2019  Hearing? Y  Comment Wears bilateral hearing aids due to hearing loss in both ears.  Vision? Y  Comment Due to torn retina. Being followed by [place].   Difficulty concentrating or making decisions? N  Walking or climbing stairs? Y  Comment Due to right knee pain.   Dressing or bathing? N  Doing errands, shopping? N  Preparing Food and eating ? N  Using the Toilet? N  In the past six months, have you accidently leaked urine? N  Do you have problems with loss of bowel control? N  Managing your Medications? N  Managing your Finances? N  Housekeeping or managing your Housekeeping? N  Some recent data might be hidden    Patient Care Team: Jerrol Banana., MD as PCP - General (Family Medicine) Bary Castilla, Forest Gleason, MD (General Surgery) Isaias Cowman, MD as Consulting Physician (Cardiology) Marry Guan Laurice Record, MD (Orthopedic Surgery) Phil Dopp, MD (Ophthalmology)     Assessment:   This is a routine wellness examination for [place].  Exercise Activities and Dietary recommendations Current Exercise Habits: The patient does not participate in regular exercise at present(Walks a lot at job and plays golf. ), Exercise limited by: None identified  Goals    . DIET - INCREASE WATER INTAKE     Recommend increasing water intake to 4-6 glasses a day.     . Exercise 3x per week (30 min per time)     Recommend to exercise for 3 days a week for at least 30 minutes at a time.     . Increase water intake     Starting 12/12/16, I will increase my water intake to 5-6 glasses a day.       Fall Risk Fall Risk  02/07/2019 01/09/2018 12/12/2016 08/23/2016 06/17/2015  Falls in the past year? 0 No Yes Yes No  Comment - - - Emmi Telephone Survey: data to providers prior to load -  Number falls in past yr: - - 1 1 -  Comment - - - Emmi Telephone Survey Actual Response = 1 -  Injury with Fall? - - No No -  Follow up - - Falls prevention discussed - -   FALL RISK PREVENTION PERTAINING TO THE HOME:  Any stairs in or around the home WITH handrails? Yes  Home free of loose throw rugs in walkways, pet beds, electrical cords, etc? Yes  Adequate lighting in your home to reduce risk of falls? Yes   ASSISTIVE DEVICES UTILIZED TO PREVENT FALLS:  Life alert? No  Use of a cane, walker or w/c? No  Grab bars in the bathroom? Yes  Shower chair or bench in shower? No  Elevated toilet seat or a handicapped toilet? No    TIMED UP AND GO:  Was the test performed? No .    Depression Screen PHQ 2/9 Scores 02/07/2019 01/09/2018 12/12/2016 06/17/2015  PHQ - 2 Score 1 0 1 0    Cognitive Function: Declined today.      6CIT Screen 12/12/2016  What Year? 0  points  What month? 0 points  What time? 0 points  Count back from 20 0 points  Months in reverse 0 points  Repeat phrase 2 points  Total Score 2    Immunization History  Administered Date(s) Administered  .  Influenza-Unspecified 10/05/2011, 09/25/2013, 10/25/2017, 09/24/2018  . Pneumococcal Conjugate-13 12/12/2016  . Pneumococcal Polysaccharide-23 08/10/1998, 02/07/2019  . Tdap 05/21/2012  . Zoster 12/12/2007  . Zoster Recombinat (Shingrix) 08/13/2018, 11/07/2018    Qualifies for Shingles Vaccine? Up to date  Tdap: Up to date  Flu Vaccine: Up to date  Pneumococcal Vaccine: Up to date  Screening Tests Health Maintenance  Topic Date Due  . TETANUS/TDAP  05/21/2022  . INFLUENZA VACCINE  Completed  . PNA vac Low Risk Adult  Completed   Cancer Screenings:  Colorectal Screening: No longer required.   Lung Cancer Screening: (Low Dose CT Chest recommended if Age 1-80 years, 30 pack-year currently smoking OR have quit w/in 15years.) does not qualify.    Additional Screening:  Vision Screening: Recommended annual ophthalmology exams for early detection of glaucoma and other disorders of the eye.  Dental Screening: Recommended annual dental exams for proper oral hygiene  Community Resource Referral:  CRR required this visit?  No        Plan:  I have personally reviewed and addressed the Medicare Annual Wellness questionnaire and have noted the following in the patient's chart:  A. Medical and social history B. Use of alcohol, tobacco or illicit drugs  C. Current medications and supplements D. Functional ability and status E.  Nutritional status F.  Physical activity G. Advance directives H. List of other physicians I.  Hospitalizations, surgeries, and ER visits in previous 12 months J.  [place] such as hearing and vision if needed, cognitive and depression L. Referrals and appointments - none  In addition, I have reviewed and discussed with patient certain preventive protocols, quality metrics, and best practice recommendations. A written personalized care plan for preventive services as well as general preventive health recommendations were provided to  patient.  See attached scanned questionnaire for additional information.   Signed,  Fabio Neighbors, LPN Nurse Health Advisor   Nurse Recommendations: None.

## 2019-02-07 NOTE — Patient Instructions (Addendum)
Gary Mueller , Thank you for taking time to come for your Medicare Wellness Visit. I appreciate your ongoing commitment to your health goals. Please review the following plan we discussed and let me know if I can assist you in the future.   Screening recommendations/referrals: Colonoscopy: No longer required.  Recommended yearly ophthalmology/optometry visit for glaucoma screening and checkup Recommended yearly dental visit for hygiene and checkup  Vaccinations: Influenza vaccine: Up to date Pneumococcal vaccine: Completed series today. Tdap vaccine: Up to date, due 04/2022 Shingles vaccine: Completed series    Advanced directives: Please bring a copy of your POA (Power of Attorney) and/or Living Will to your next appointment.   Conditions/risks identified: Recommend to exercise for 3 days a week for at least 30 minutes at a time.   Next appointment: 04/20/19 @ 2:00 PM with Dr Rosanna Randy.   Preventive Care 80 Years and Older, Male Preventive care refers to lifestyle choices and visits with your health care provider that can promote health and wellness. What does preventive care include?  A yearly physical exam. This is also called an annual well check.  Dental exams once or twice a year.  Routine eye exams. Ask your health care provider how often you should have your eyes checked.  Personal lifestyle choices, including:  Daily care of your teeth and gums.  Regular physical activity.  Eating a healthy diet.  Avoiding tobacco and drug use.  Limiting alcohol use.  Practicing safe sex.  Taking low doses of aspirin every day.  Taking vitamin and mineral supplements as recommended by your health care provider. What happens during an annual well check? The services and screenings done by your health care provider during your annual well check will depend on your age, overall health, lifestyle risk factors, and family history of disease. Counseling  Your health care provider may  ask you questions about your:  Alcohol use.  Tobacco use.  Drug use.  Emotional well-being.  Home and relationship well-being.  Sexual activity.  Eating habits.  History of falls.  Memory and ability to understand (cognition).  Work and work Statistician. Screening  You may have the following tests or measurements:  Height, weight, and BMI.  Blood pressure.  Lipid and cholesterol levels. These may be checked every 5 years, or more frequently if you are over 10 years old.  Skin check.  Lung cancer screening. You may have this screening every year starting at age 61 if you have a 30-pack-year history of smoking and currently smoke or have quit within the past 15 years.  Fecal occult blood test (FOBT) of the stool. You may have this test every year starting at age 12.  Flexible sigmoidoscopy or colonoscopy. You may have a sigmoidoscopy every 5 years or a colonoscopy every 10 years starting at age 63.  Prostate cancer screening. Recommendations will vary depending on your family history and other risks.  Hepatitis C blood test.  Hepatitis B blood test.  Sexually transmitted disease (STD) testing.  Diabetes screening. This is done by checking your blood sugar (glucose) after you have not eaten for a while (fasting). You may have this done every 1-3 years.  Abdominal aortic aneurysm (AAA) screening. You may need this if you are a current or former smoker.  Osteoporosis. You may be screened starting at age 80 if you are at high risk. Talk with your health care provider about your test results, treatment options, and if necessary, the need for more tests. Vaccines  Your health  care provider may recommend certain vaccines, such as:  Influenza vaccine. This is recommended every year.  Tetanus, diphtheria, and acellular pertussis (Tdap, Td) vaccine. You may need a Td booster every 10 years.  Zoster vaccine. You may need this after age 80.  Pneumococcal 13-valent  conjugate (PCV13) vaccine. One dose is recommended after age 80.  Pneumococcal polysaccharide (PPSV23) vaccine. One dose is recommended after age 80. Talk to your health care provider about which screenings and vaccines you need and how often you need them. This information is not intended to replace advice given to you by your health care provider. Make sure you discuss any questions you have with your health care provider. Document Released: 01/07/2016 Document Revised: 08/30/2016 Document Reviewed: 10/12/2015 Elsevier Interactive Patient Education  2017 [place] Prevention in the Home Falls can cause injuries. They can happen to people of all ages. There are many things you can do to make your home safe and to help prevent falls. What can I do on the outside of my home?  Regularly fix the edges of walkways and driveways and fix any cracks.  Remove anything that might make you trip as you walk through a door, such as a raised step or threshold.  Trim any bushes or trees on the path to your home.  Use bright outdoor lighting.  Clear any walking paths of anything that might make someone trip, such as rocks or tools.  Regularly check to see if handrails are loose or broken. Make sure that both sides of any steps have handrails.  Any raised decks and porches should have guardrails on the edges.  Have any leaves, snow, or ice cleared regularly.  Use sand or salt on walking paths during winter.  Clean up any spills in your garage right away. This includes oil or grease spills. What can I do in the bathroom?  Use night lights.  Install grab bars by the toilet and in the tub and shower. Do not use towel bars as grab bars.  Use non-skid mats or decals in the tub or shower.  If you need to sit down in the shower, use a plastic, non-slip stool.  Keep the floor dry. Clean up any water that spills on the floor as soon as it happens.  Remove soap buildup in the tub or  shower regularly.  Attach bath mats securely with double-sided non-slip rug tape.  Do not have throw rugs and other things on the floor that can make you trip. What can I do in the bedroom?  Use night lights.  Make sure that you have a light by your bed that is easy to reach.  Do not use any sheets or blankets that are too big for your bed. They should not hang down onto the floor.  Have a firm chair that has side arms. You can use this for support while you get dressed.  Do not have throw rugs and other things on the floor that can make you trip. What can I do in the kitchen?  Clean up any spills right away.  Avoid walking on wet floors.  Keep items that you use a lot in easy-to-reach places.  If you need to reach something above you, use a strong step stool that has a grab bar.  Keep electrical cords out of the way.  Do not use floor polish or wax that makes floors slippery. If you must use wax, use non-skid floor wax.  Do not have  throw rugs and other things on the floor that can make you trip. What can I do with my stairs?  Do not leave any items on the stairs.  Make sure that there are handrails on both sides of the stairs and use them. Fix handrails that are broken or loose. Make sure that handrails are as long as the stairways.  Check any carpeting to make sure that it is firmly attached to the stairs. Fix any carpet that is loose or worn.  Avoid having throw rugs at the top or bottom of the stairs. If you do have throw rugs, attach them to the floor with carpet tape.  Make sure that you have a light switch at the top of the stairs and the bottom of the stairs. If you do not have them, ask someone to add them for you. What else can I do to help prevent falls?  Wear shoes that:  Do not have high heels.  Have rubber bottoms.  Are comfortable and fit you well.  Are closed at the toe. Do not wear sandals.  If you use a stepladder:  Make sure that it is fully  opened. Do not climb a closed stepladder.  Make sure that both sides of the stepladder are locked into place.  Ask someone to hold it for you, if possible.  Clearly mark and make sure that you can see:  Any grab bars or handrails.  First and last steps.  Where the edge of each step is.  Use tools that help you move around (mobility aids) if they are needed. These include:  Canes.  Walkers.  Scooters.  Crutches.  Turn on the lights when you go into a dark area. Replace any light bulbs as soon as they burn out.  Set up your furniture so you have a clear path. Avoid moving your furniture around.  If any of your floors are uneven, fix them.  If there are any pets around you, be aware of where they are.  Review your medicines with your doctor. Some medicines can make you feel dizzy. This can increase your chance of falling. Ask your doctor what other things that you can do to help prevent falls. This information is not intended to replace advice given to you by your health care provider. Make sure you discuss any questions you have with your health care provider. Document Released: 10/07/2009 Document Revised: 05/18/2016 Document Reviewed: 01/15/2015 Elsevier Interactive Patient Education  2017 [employer].

## 2019-02-10 DIAGNOSIS — H5989 Other postprocedural complications and disorders of eye and adnexa, not elsewhere classified: Secondary | ICD-10-CM | POA: Diagnosis not present

## 2019-02-24 DIAGNOSIS — H353132 Nonexudative age-related macular degeneration, bilateral, intermediate dry stage: Secondary | ICD-10-CM | POA: Diagnosis not present

## 2019-02-24 DIAGNOSIS — H4312 Vitreous hemorrhage, left eye: Secondary | ICD-10-CM | POA: Diagnosis not present

## 2019-02-24 DIAGNOSIS — Z961 Presence of intraocular lens: Secondary | ICD-10-CM | POA: Diagnosis not present

## 2019-02-24 DIAGNOSIS — H5989 Other postprocedural complications and disorders of eye and adnexa, not elsewhere classified: Secondary | ICD-10-CM | POA: Diagnosis not present

## 2019-02-24 DIAGNOSIS — H35352 Cystoid macular degeneration, left eye: Secondary | ICD-10-CM | POA: Diagnosis not present

## 2019-02-27 ENCOUNTER — Ambulatory Visit (INDEPENDENT_AMBULATORY_CARE_PROVIDER_SITE_OTHER): Payer: Medicare Other | Admitting: Family Medicine

## 2019-02-27 ENCOUNTER — Encounter: Payer: Self-pay | Admitting: Family Medicine

## 2019-02-27 VITALS — BP 142/70 | HR 56 | Temp 98.5°F | Wt 179.0 lb

## 2019-02-27 DIAGNOSIS — J0100 Acute maxillary sinusitis, unspecified: Secondary | ICD-10-CM

## 2019-02-27 MED ORDER — AMOXICILLIN 875 MG PO TABS: 875.0000 mg | ORAL_TABLET | Freq: Two times a day (BID) | ORAL | 0 refills | Status: DC

## 2019-02-27 NOTE — Progress Notes (Signed)
Patient: Gary Mueller Male    DOB: [birth year]   78 y.o.   MRN: [id number] Visit Date: 02/27/2019  Today's Provider: Vernie Murders, PA   No chief complaint on file.  Subjective:     URI   This is a new problem. The current episode started in the past 7 days (started 02/25/19). The problem has been gradually worsening. There has been no fever. Associated symptoms include congestion, coughing, headaches and a sore throat. Pertinent negatives include no ear pain. Associated symptoms comments: Coughing up (yuckie) mucus. He has tried decongestant for the symptoms.   Past Medical History:  Diagnosis Date   Allergy    Anxiety    Arthritis    Cancer ([place]) 10/20/2016   Basal Cell Skin Cancer   Depression    Dysrhythmia    A FIB   GERD (gastroesophageal reflux disease)    HOH (hard of hearing)    Bilateral   Hyperlipidemia    Hypertension    Patient denies   Hypothyroidism    IBS (irritable bowel syndrome)    Thyroid disease    Tinnitus of both ears    Past Surgical History:  Procedure Laterality Date   ANTERIOR VITRECTOMY Left 11/28/2018   Procedure: ANTERIOR VITRECTOMY;  Surgeon: Marchia Meiers, MD;  Location: [facility] ORS;  Service: Ophthalmology;  Laterality: Left;   CATARACT EXTRACTION W/PHACO Right 10/10/2018   Procedure: CATARACT EXTRACTION PHACO AND INTRAOCULAR LENS PLACEMENT (IOC);  Surgeon: Marchia Meiers, MD;  Location: [facility] ORS;  Service: Ophthalmology;  Laterality: Right;  [place] 00:41 CDE 6.87 Fluid pack lot # [id number]   CATARACT EXTRACTION W/PHACO Left 11/28/2018   Procedure: CATARACT EXTRACTION PHACO AND INTRAOCULAR LENS PLACEMENT (IOC)-LEFT;  Surgeon: Marchia Meiers, MD;  Location: [facility] OR[facility];  Service: Ophthalmology;  Laterality: Left;  [place] 00:47.7 CDE 8.92 Fluid Pack lot # [id number]   CHOLECYSTECTOMY  1968   CHONDROPLASTY Right 01/02/2018   Procedure: CHONDROPLASTY;  Surgeon: Dereck Leep, MD;  Location: [facility] ORS;  Service: Orthopedics;   Laterality: Right;   COLONOSCOPY  2014   HERNIA REPAIR Left 04/18/1996   inguinal hernia repair/ Dr Bary Castilla   HERNIA REPAIR Right 02/26/1996   Dr Bary Castilla   HERNIA REPAIR Left 08/07/2002   Dr Bary Castilla   JOINT REPLACEMENT     KNEE ARTHROSCOPY Right 01/02/2018   Procedure: ARTHROSCOPY KNEE;  Surgeon: Dereck Leep, MD;  Location: [facility] OR[facility];  Service: Orthopedics;  Laterality: Right;   KNEE ARTHROSCOPY WITH MEDIAL MENISECTOMY Right 01/02/2018   Procedure: KNEE ARTHROSCOPY WITH MEDIAL MENISECTOMY;  Surgeon: Dereck Leep, MD;  Location: [facility] OR[facility];  Service: Orthopedics;  Laterality: Right;   TONSILLECTOMY     as a child   TOTAL KNEE ARTHROPLASTY Left 03/29/2015   [facility] Dr. Marry Guan   Family History  Problem Relation Age of Onset   Heart disease Mother        A fib   Healthy Sister    Allergies  Allergen Reactions   Codeine     GI intolerance; hallucinations    Statins     Muscle pain    Current Outpatient Medications:    acetaminophen (TYLENOL) 500 MG tablet, Take 1,000 mg by mouth daily as needed for moderate pain., Disp: , Rfl:    amLODipine (NORVASC) 5 MG tablet, TAKE ONE TABLET BY MOUTH EVERY DAY, Disp: 90 tablet, Rfl: 3   Ascorbic Acid (VITAMIN C) 1000 MG tablet, Take 1,000 mg by mouth every evening. , Disp: ,  Rfl:    aspirin EC 81 MG tablet, Take 81 mg by mouth every evening. , Disp: , Rfl:    cholecalciferol (VITAMIN D3) 25 MCG (1000 UT) tablet, Take 1,000 Units by mouth every evening., Disp: , Rfl:    diclofenac sodium (VOLTAREN) 1 % GEL, Apply 1 application topically as needed (knee pain)., Disp: , Rfl:    levothyroxine (SYNTHROID, LEVOTHROID) 100 MCG tablet, TAKE ONE TABLET BY MOUTH EVERY DAY, Disp: 90 tablet, Rfl: 3   Multiple Vitamin (MULTIVITAMIN WITH MINERALS) TABS tablet, Take 1 tablet by mouth every evening. , Disp: , Rfl:    prednisoLONE acetate (PRED FORTE) 1 % ophthalmic suspension, Place 1 drop into the left eye every 6 (six) hours as needed. ,  Disp: , Rfl:    sertraline (ZOLOFT) 50 MG tablet, Take 0.5 tablets (25 mg total) by mouth at bedtime., Disp: 1 tablet, Rfl: 0   amoxicillin (AMOXIL) 875 MG tablet, Take 1 tablet (875 mg total) by mouth 2 (two) times daily. (Patient not taking: Reported on 02/07/2019), Disp: 20 tablet, Rfl: 0   ezetimibe (ZETIA) 10 MG tablet, Take 1 tablet (10 mg total) by mouth daily. (Patient not taking: Reported on 02/27/2019), Disp: 30 tablet, Rfl: 12  Review of Systems  Constitutional: Positive for chills and fatigue.  HENT: Positive for congestion, sore throat and trouble swallowing. Negative for ear pain.   Respiratory: Positive for cough and shortness of breath.   Neurological: Positive for headaches. Negative for dizziness.   Social History   Tobacco Use   Smoking status: Former Smoker    Packs/day: 1.50    Types: Cigarettes    Last attempt to quit: 12/24/1964    Years since quitting: 54.2   Smokeless tobacco: Never Used  Substance Use Topics   Alcohol use: Yes    Frequency: Never    Comment: 1 beer or glass of wine monthly     Objective:   BP (!) 142/70 (BP Location: Left Arm, Patient Position: Sitting, Cuff Size: Normal)    Pulse (!) 56    Temp 98.5 F (36.9 C) (Oral)    Wt 179 lb (81.2 kg)    SpO2 97%    BMI 23.62 kg/m  Vitals:   02/27/19 0955  BP: (!) 142/70  Pulse: (!) 56  Temp: 98.5 F (36.9 C)  TempSrc: Oral  SpO2: 97%  Weight: 179 lb (81.2 kg)   Physical Exam HENT:     Head: Normocephalic and atraumatic.     Right Ear: Tympanic membrane normal.     Left Ear: Tympanic membrane normal.     Nose:     Comments: No transillumination of maxillary sinuses with minimal tenderness.    Mouth/Throat:     Pharynx: No oropharyngeal exudate.     Comments: Slight irritation from PND. Eyes:     Conjunctiva/sclera: Conjunctivae normal.  Neck:     Musculoskeletal: Neck supple.  Cardiovascular:     Rate and Rhythm: Normal rate and regular rhythm.     Heart sounds: Normal heart  sounds.  Pulmonary:     Effort: Pulmonary effort is normal.     Breath sounds: Normal breath sounds.  Lymphadenopathy:     Cervical: No cervical adenopathy.  Neurological:     Mental Status: He is alert and oriented to person, place, and time.       Assessment & Plan    1. Subacute maxillary sinusitis Onset the past couple days. Similar to sinusitis in January 2020. Has tried Mucinex  and cough drops with some relief. Feels a swollen scratch throat with PND and some sputum production. Symptoms seem worse in the evenings and early morning. Recommend repeat of Amoxil treatment and add Flonase to the above OTC symptomatic treatment regimen. Increase fluid intake and recheck prn. - amoxicillin (AMOXIL) 875 MG tablet; Take 1 tablet (875 mg total) by mouth 2 (two) times daily.  Dispense: 20 tablet; Refill: [place], [place]  [facility]

## 2019-03-03 ENCOUNTER — Telehealth: Payer: Self-pay | Admitting: Family Medicine

## 2019-03-03 DIAGNOSIS — I493 Ventricular premature depolarization: Secondary | ICD-10-CM | POA: Diagnosis not present

## 2019-03-03 DIAGNOSIS — I2510 Atherosclerotic heart disease of native coronary artery without angina pectoris: Secondary | ICD-10-CM | POA: Diagnosis not present

## 2019-03-03 DIAGNOSIS — R0602 Shortness of breath: Secondary | ICD-10-CM | POA: Diagnosis not present

## 2019-03-03 DIAGNOSIS — E785 Hyperlipidemia, unspecified: Secondary | ICD-10-CM | POA: Diagnosis not present

## 2019-03-03 NOTE — Telephone Encounter (Signed)
Pt called saying he seen Dr. Lorinda Creed today and he suggested that Dr. Rosanna Randy put him on a low dose of Crestor 5 mg/  Dr. Lorinda Creed drew his labs a couple months ago and his Cholesterol was up.  Pt ststes he has an appt with Dr. Rosanna Randy in April  [place]  CB#  [phone]  Thanks Con Memos

## 2019-03-04 ENCOUNTER — Other Ambulatory Visit: Payer: Self-pay | Admitting: Family Medicine

## 2019-03-04 DIAGNOSIS — M1711 Unilateral primary osteoarthritis, right knee: Secondary | ICD-10-CM | POA: Diagnosis not present

## 2019-03-04 DIAGNOSIS — E785 Hyperlipidemia, unspecified: Secondary | ICD-10-CM

## 2019-03-04 MED ORDER — ROSUVASTATIN CALCIUM 5 MG PO TABS: 5.0000 mg | ORAL_TABLET | Freq: Every day | ORAL | 3 refills | Status: DC

## 2019-03-04 NOTE — Telephone Encounter (Signed)
Please start rosuvastatin 5 mg daily.  We will check labs when he comes in in April

## 2019-03-04 NOTE — Telephone Encounter (Signed)
Pt returned call and was advised that rx was sent to pharmacy.  dbs

## 2019-03-04 NOTE — Telephone Encounter (Signed)
LMTCB.  Sent rosuvastatin to pharmacy.  dbs

## 2019-03-04 NOTE — Telephone Encounter (Signed)
Please Review

## 2019-03-06 DIAGNOSIS — H538 Other visual disturbances: Secondary | ICD-10-CM | POA: Diagnosis not present

## 2019-03-06 DIAGNOSIS — H35352 Cystoid macular degeneration, left eye: Secondary | ICD-10-CM | POA: Diagnosis not present

## 2019-03-06 DIAGNOSIS — H353132 Nonexudative age-related macular degeneration, bilateral, intermediate dry stage: Secondary | ICD-10-CM | POA: Diagnosis not present

## 2019-03-14 ENCOUNTER — Other Ambulatory Visit: Payer: Self-pay | Admitting: Family Medicine

## 2019-03-14 DIAGNOSIS — E785 Hyperlipidemia, unspecified: Secondary | ICD-10-CM

## 2019-04-11 ENCOUNTER — Other Ambulatory Visit: Payer: Self-pay | Admitting: Family Medicine

## 2019-04-11 MED ORDER — AMOXICILLIN-POT CLAVULANATE 875-125 MG PO TABS: 1.0000 | ORAL_TABLET | Freq: Two times a day (BID) | ORAL | 0 refills | Status: DC

## 2019-04-11 NOTE — Progress Notes (Signed)
Treating sinusitis during covid pandemic.

## 2019-04-17 ENCOUNTER — Encounter: Payer: Medicare Other | Admitting: Family Medicine

## 2019-04-21 ENCOUNTER — Other Ambulatory Visit: Payer: Self-pay

## 2019-04-22 MED ORDER — SERTRALINE HCL 50 MG PO TABS: 25.0000 mg | ORAL_TABLET | Freq: Every day | ORAL | 3 refills | Status: DC

## 2019-05-26 DIAGNOSIS — H27122 Anterior dislocation of lens, left eye: Secondary | ICD-10-CM | POA: Diagnosis not present

## 2019-05-26 DIAGNOSIS — H35352 Cystoid macular degeneration, left eye: Secondary | ICD-10-CM | POA: Diagnosis not present

## 2019-05-26 DIAGNOSIS — H353132 Nonexudative age-related macular degeneration, bilateral, intermediate dry stage: Secondary | ICD-10-CM | POA: Diagnosis not present

## 2019-05-26 DIAGNOSIS — H4312 Vitreous hemorrhage, left eye: Secondary | ICD-10-CM | POA: Diagnosis not present

## 2019-06-02 DIAGNOSIS — H35352 Cystoid macular degeneration, left eye: Secondary | ICD-10-CM | POA: Diagnosis not present

## 2019-06-02 DIAGNOSIS — H353132 Nonexudative age-related macular degeneration, bilateral, intermediate dry stage: Secondary | ICD-10-CM | POA: Diagnosis not present

## 2019-06-08 DIAGNOSIS — Z1159 Encounter for screening for other viral diseases: Secondary | ICD-10-CM | POA: Diagnosis not present

## 2019-06-08 DIAGNOSIS — Z01812 Encounter for preprocedural laboratory examination: Secondary | ICD-10-CM | POA: Diagnosis not present

## 2019-06-08 DIAGNOSIS — H25812 Combined forms of age-related cataract, left eye: Secondary | ICD-10-CM | POA: Diagnosis not present

## 2019-06-08 DIAGNOSIS — H27122 Anterior dislocation of lens, left eye: Secondary | ICD-10-CM | POA: Diagnosis not present

## 2019-06-10 DIAGNOSIS — H27122 Anterior dislocation of lens, left eye: Secondary | ICD-10-CM | POA: Diagnosis not present

## 2019-06-10 DIAGNOSIS — H25812 Combined forms of age-related cataract, left eye: Secondary | ICD-10-CM | POA: Diagnosis not present

## 2019-06-16 ENCOUNTER — Other Ambulatory Visit: Payer: Self-pay

## 2019-06-16 ENCOUNTER — Ambulatory Visit (INDEPENDENT_AMBULATORY_CARE_PROVIDER_SITE_OTHER): Payer: Medicare Other | Admitting: Family Medicine

## 2019-06-16 ENCOUNTER — Encounter: Payer: Self-pay | Admitting: Family Medicine

## 2019-06-16 VITALS — BP 144/57 | HR 54 | Temp 98.5°F | Resp 16 | Ht 72.0 in | Wt 175.0 lb

## 2019-06-16 DIAGNOSIS — I10 Essential (primary) hypertension: Secondary | ICD-10-CM

## 2019-06-16 DIAGNOSIS — E785 Hyperlipidemia, unspecified: Secondary | ICD-10-CM

## 2019-06-16 DIAGNOSIS — I2510 Atherosclerotic heart disease of native coronary artery without angina pectoris: Secondary | ICD-10-CM

## 2019-06-16 DIAGNOSIS — E039 Hypothyroidism, unspecified: Secondary | ICD-10-CM

## 2019-06-16 NOTE — Progress Notes (Signed)
Patient: Gary Mueller Male    DOB: 1941/02/20   78 y.o.   MRN: [id number] Visit Date: 06/16/2019  Today's Provider: Wilhemena Durie, MD   Chief Complaint  Patient presents with  . Hyperlipidemia  . Hypertension  . Hypothyroidism   Subjective:   Patient had AWV on 02-08-2019.   HPI  Wife died earlier this year. He has  2 sons and a daughter in [place]. Son Merry Proud has intellectual disabilities and works for pt. Other son has has recent recurrence of alcoholism but has been sober for past month. Daughter is doing well. Pt had cataract surgery Dec 2019 and has had a torn cornea now followed at [facility].  Lipid/Cholesterol, Follow-up:   Last seen for this6 months ago.  Management changes since that visit include no changes. . Last Lipid Panel:    Component Value Date/Time   CHOL 212 (H) 01/10/2018 0831   TRIG 290 (H) 01/10/2018 0831   HDL 33 (L) 01/10/2018 0831   LDLCALC 121 (H) 01/10/2018 0831    Risk factors for vascular disease include hypertension  He reports good compliance with treatment. He is not having side effects.  Current symptoms include none and have been stable. Weight trend: stable Prior visit with dietician: no Current diet: well balanced Current exercise: no regular exercise  Wt Readings from Last 3 Encounters:  06/16/19 175 lb (79.4 kg)  02/27/19 179 lb (81.2 kg)  02/08/19 178 lb (80.7 kg)    Hypertension, follow-up:  BP Readings from Last 3 Encounters:  06/16/19 (!) 144/57  02/27/19 (!) 142/70  02-08-19 136/60    He was last seen for hypertension 6 months ago.  BP at that visit was 140/70. Management since that visit includes no changes. He reports good compliance with treatment. He is not having side effects.  He is not exercising. He is adherent to low salt diet.   Outside blood pressures are not being checked. He is experiencing none.  Patient denies exertional chest pressure/discomfort, lower extremity edema and  palpitations.      Hypothyroidism, follow up: Patient was last seen in the office 6 months ago. No medications were changed since last visit. He is currently taking levothyroxine 172mcg daily, and reports good compliance and good symptom control.  Lab Results  Component Value Date   TSH 8.280 (H) 01/10/2018    Allergies  Allergen Reactions  . Codeine     GI intolerance; hallucinations   . Statins     Muscle pain     Current Outpatient Medications:  .  acetaminophen (TYLENOL) 500 MG tablet, Take 1,000 mg by mouth daily as needed for moderate pain., Disp: , Rfl:  .  amLODipine (NORVASC) 5 MG tablet, TAKE ONE TABLET BY MOUTH EVERY DAY, Disp: 90 tablet, Rfl: 3 .  amoxicillin (AMOXIL) 875 MG tablet, Take 1 tablet (875 mg total) by mouth 2 (two) times daily., Disp: 20 tablet, Rfl: 0 .  Ascorbic Acid (VITAMIN C) 1000 MG tablet, Take 1,000 mg by mouth every evening. , Disp: , Rfl:  .  aspirin EC 81 MG tablet, Take 81 mg by mouth every evening. , Disp: , Rfl:  .  cholecalciferol (VITAMIN D3) 25 MCG (1000 UT) tablet, Take 1,000 Units by mouth every evening., Disp: , Rfl:  .  diclofenac sodium (VOLTAREN) 1 % GEL, Apply 1 application topically as needed (knee pain)., Disp: , Rfl:  .  ezetimibe (ZETIA) 10 MG tablet, TAKE 1 TABLET BY MOUTH  DAILY, Disp: 30 tablet, Rfl: 12 .  levothyroxine (SYNTHROID, LEVOTHROID) 100 MCG tablet, TAKE ONE TABLET BY MOUTH EVERY DAY, Disp: 90 tablet, Rfl: 3 .  Multiple Vitamin (MULTIVITAMIN WITH MINERALS) TABS tablet, Take 1 tablet by mouth every evening. , Disp: , Rfl:  .  prednisoLONE acetate (PRED FORTE) 1 % ophthalmic suspension, Place 1 drop into the left eye every 6 (six) hours as needed. , Disp: , Rfl:  .  rosuvastatin (CRESTOR) 5 MG tablet, Take 1 tablet (5 mg total) by mouth daily., Disp: 90 tablet, Rfl: 3 .  sertraline (ZOLOFT) 50 MG tablet, Take 0.5 tablets (25 mg total) by mouth at bedtime., Disp: 45 tablet, Rfl: 3 .  amoxicillin-clavulanate (AUGMENTIN)  875-125 MG tablet, Take 1 tablet by mouth 2 (two) times daily., Disp: 20 tablet, Rfl: 0  Review of Systems  Constitutional: Negative.   HENT: Negative.   Eyes: Negative.   Respiratory: Negative.   Cardiovascular: Negative.   Gastrointestinal: Negative.   Endocrine: Negative.   Musculoskeletal: Negative.   Skin: Negative.   Allergic/Immunologic: Negative.   Neurological: Negative.   Hematological: Negative.   Psychiatric/Behavioral: Negative.     Social History   Tobacco Use  . Smoking status: Former Smoker    Packs/day: 1.50    Types: Cigarettes    Quit date: 12/24/1964    Years since quitting: 54.5  . Smokeless tobacco: Never Used  Substance Use Topics  . Alcohol use: Yes    Frequency: Never    Comment: 1 beer or glass of wine monthly   Depression screen [facility] 2/9 02/07/2019 01/09/2018 12/12/2016  Decreased Interest 0 0 0  Down, Depressed, Hopeless 1 0 1  PHQ - 2 Score 1 0 1   6CIT Screen 06/16/2019 12/12/2016  What Year? 0 points 0 points  What month? 0 points 0 points  What time? 0 points 0 points  Count back from 20 0 points 0 points  Months in reverse 0 points 0 points  Repeat phrase 2 points 2 points  Total Score 2 2       Objective:   BP (!) 144/57 (BP Location: Left Arm, Patient Position: Sitting, Cuff Size: Normal)   Pulse (!) 54   Temp 98.5 F (36.9 C)   Resp 16   Ht 6' (1.829 m)   Wt 175 lb (79.4 kg)   BMI 23.73 kg/m  Vitals:   06/16/19 1026  BP: (!) 144/57  Pulse: (!) 54  Resp: 16  Temp: 98.5 F (36.9 C)  Weight: 175 lb (79.4 kg)  Height: 6' (1.829 m)     Physical Exam Vitals signs reviewed.  Constitutional:      Appearance: Normal appearance. He is well-developed.  HENT:     Head: Normocephalic and atraumatic.     Right Ear: External ear normal.     Left Ear: External ear normal.     Nose: Nose normal.  Eyes:     General: No scleral icterus.    Conjunctiva/sclera: Conjunctivae normal.  Neck:     Thyroid: No thyromegaly.      Vascular: No carotid bruit.  Cardiovascular:     Rate and Rhythm: Normal rate and regular rhythm.     Heart sounds: Normal heart sounds.  Pulmonary:     Effort: Pulmonary effort is normal.     Breath sounds: Normal breath sounds.  Abdominal:     Palpations: Abdomen is soft.  Lymphadenopathy:     Cervical: No cervical adenopathy.  Skin:  General: Skin is warm and dry.  Neurological:     General: No focal deficit present.     Mental Status: He is alert and oriented to person, place, and time.  Psychiatric:        Mood and Affect: Mood normal.        Behavior: Behavior normal.        Thought Content: Thought content normal.        Judgment: Judgment normal.         Assessment & Plan    1. CAD in native artery  All risk factors treated.Followed by dr Saralyn Pilar.  - Comprehensive metabolic panel  2. Hyperlipidemia, unspecified hyperlipidemia type Crestor. - Lipid panel  3. Essential hypertension On norvasc.RTC 6 months. - CBC with Differential/Platelet  4. Adult hypothyroidism On synthroid 113mcg. - TSH    I have done the exam and reviewed the above chart and it is accurate to the best of my knowledge. Development worker, community has been used in this note in any air is in the dictation or transcription are unintentional.  Wilhemena Durie, MD  [place]

## 2019-06-17 LAB — COMPREHENSIVE METABOLIC PANEL
ALT: 21 IU/L (ref 0–44)
AST: 21 IU/L (ref 0–40)
Albumin/Globulin Ratio: 2.9 — ABNORMAL HIGH (ref 1.2–2.2)
Albumin: 4.7 g/dL (ref 3.7–4.7)
Alkaline Phosphatase: 78 IU/L (ref 39–117)
BUN/Creatinine Ratio: 20 (ref 10–24)
BUN: 18 mg/dL (ref 8–27)
Bilirubin Total: 0.7 mg/dL (ref 0.0–1.2)
CO2: 23 mmol/L (ref 20–29)
Calcium: 9.3 mg/dL (ref 8.6–10.2)
Chloride: 103 mmol/L (ref 96–106)
Creatinine, Ser: 0.91 mg/dL (ref 0.76–1.27)
GFR calc Af Amer: 92 mL/min/{1.73_m2} (ref >59)
GFR calc non Af Amer: 80 mL/min/{1.73_m2} (ref >59)
Globulin, Total: 1.6 g/dL (ref 1.5–4.5)
Glucose: 101 mg/dL — ABNORMAL HIGH (ref 65–99)
Potassium: 4.3 mmol/L (ref 3.5–5.2)
Sodium: 142 mmol/L (ref 134–144)
Total Protein: 6.3 g/dL (ref 6.0–8.5)

## 2019-06-17 LAB — CBC WITH DIFFERENTIAL/PLATELET
Basophils Absolute: 0 x10E3/uL (ref 0.0–0.2)
Basos: 0 %
EOS (ABSOLUTE): 0.1 x10E3/uL (ref 0.0–0.4)
Eos: 1 %
Hematocrit: 40.6 % (ref 37.5–51.0)
Hemoglobin: 14.2 g/dL (ref 13.0–17.7)
Immature Grans (Abs): 0 x10E3/uL (ref 0.0–0.1)
Immature Granulocytes: 0 %
Lymphocytes Absolute: 2.4 x10E3/uL (ref 0.7–3.1)
Lymphs: 30 %
MCH: 32.3 pg (ref 26.6–33.0)
MCHC: 35 g/dL (ref 31.5–35.7)
MCV: 92 fL (ref 79–97)
Monocytes Absolute: 0.5 x10E3/uL (ref 0.1–0.9)
Monocytes: 6 %
Neutrophils Absolute: 5 x10E3/uL (ref 1.4–7.0)
Neutrophils: 63 %
Platelets: 181 x10E3/uL (ref 150–450)
RBC: 4.4 x10E6/uL (ref 4.14–5.80)
RDW: 11.9 % (ref 11.6–15.4)
WBC: 7.9 x10E3/uL (ref 3.4–10.8)

## 2019-06-17 LAB — LIPID PANEL
Chol/HDL Ratio: 3.1 ratio (ref 0.0–5.0)
Cholesterol, Total: 150 mg/dL (ref 100–199)
HDL: 49 mg/dL (ref >39)
LDL Calculated: 81 mg/dL (ref 0–99)
Triglycerides: 101 mg/dL (ref 0–149)
VLDL Cholesterol Cal: 20 mg/dL (ref 5–40)

## 2019-06-17 LAB — TSH: TSH: 0.894 u[IU]/mL (ref 0.450–4.500)

## 2019-06-19 ENCOUNTER — Telehealth: Payer: Self-pay | Admitting: Family Medicine

## 2019-06-19 ENCOUNTER — Telehealth: Payer: Self-pay

## 2019-06-19 NOTE — Telephone Encounter (Signed)
It is ok but can cut synthroid from 100 to 50 mcg daily

## 2019-06-19 NOTE — Telephone Encounter (Signed)
Pt's TSH - level 0.894 in recent labs. Wants to know what Dr. Rosanna Randy would advise him to do since it's low.  Thanks, [employer]

## 2019-06-19 NOTE — Telephone Encounter (Signed)
Tried calling patient, no answer. Will try again later.  

## 2019-06-19 NOTE — Telephone Encounter (Signed)
Please review. Thanks!  

## 2019-06-19 NOTE — Telephone Encounter (Signed)
-----   Message from Jerrol Banana., MD sent at 06/19/2019  7:43 AM EDT ----- Lipids much better.

## 2019-06-20 NOTE — Telephone Encounter (Signed)
Patient reports that since he is asymptomatic, he will continue to take his current dose.

## 2019-06-20 NOTE — Telephone Encounter (Signed)
Patient advised.

## 2019-07-07 DIAGNOSIS — H353132 Nonexudative age-related macular degeneration, bilateral, intermediate dry stage: Secondary | ICD-10-CM | POA: Insufficient documentation

## 2019-07-07 DIAGNOSIS — H35352 Cystoid macular degeneration, left eye: Secondary | ICD-10-CM | POA: Diagnosis not present

## 2019-07-07 HISTORY — DX: Cystoid macular degeneration, left eye: H35.352

## 2019-08-07 ENCOUNTER — Other Ambulatory Visit: Payer: Self-pay

## 2019-08-07 DIAGNOSIS — R6889 Other general symptoms and signs: Secondary | ICD-10-CM | POA: Diagnosis not present

## 2019-08-07 DIAGNOSIS — Z20822 Contact with and (suspected) exposure to covid-19: Secondary | ICD-10-CM

## 2019-08-09 LAB — NOVEL CORONAVIRUS, NAA: SARS-CoV-2, NAA: NOT DETECTED

## 2019-08-09 LAB — SPECIMEN STATUS REPORT

## 2019-08-18 DIAGNOSIS — H353132 Nonexudative age-related macular degeneration, bilateral, intermediate dry stage: Secondary | ICD-10-CM | POA: Diagnosis not present

## 2019-08-18 DIAGNOSIS — H35352 Cystoid macular degeneration, left eye: Secondary | ICD-10-CM | POA: Diagnosis not present

## 2019-09-04 DIAGNOSIS — E785 Hyperlipidemia, unspecified: Secondary | ICD-10-CM | POA: Diagnosis not present

## 2019-09-04 DIAGNOSIS — I493 Ventricular premature depolarization: Secondary | ICD-10-CM | POA: Diagnosis not present

## 2019-09-04 DIAGNOSIS — R0602 Shortness of breath: Secondary | ICD-10-CM | POA: Diagnosis not present

## 2019-09-04 DIAGNOSIS — I2510 Atherosclerotic heart disease of native coronary artery without angina pectoris: Secondary | ICD-10-CM | POA: Diagnosis not present

## 2019-09-10 DIAGNOSIS — L309 Dermatitis, unspecified: Secondary | ICD-10-CM | POA: Diagnosis not present

## 2019-09-15 ENCOUNTER — Telehealth: Payer: Self-pay | Admitting: Family Medicine

## 2019-09-15 NOTE — Telephone Encounter (Signed)
Pt wants to know if he can get a flu and pneumonia vaccine the same day  [phone]

## 2019-09-15 NOTE — Telephone Encounter (Signed)
Appointment scheduled.

## 2019-09-25 ENCOUNTER — Ambulatory Visit (INDEPENDENT_AMBULATORY_CARE_PROVIDER_SITE_OTHER): Payer: Medicare Other | Admitting: Family Medicine

## 2019-09-25 ENCOUNTER — Other Ambulatory Visit: Payer: Self-pay

## 2019-09-25 DIAGNOSIS — Z23 Encounter for immunization: Secondary | ICD-10-CM

## 2019-10-02 NOTE — Progress Notes (Signed)
Vaccines only 

## 2019-10-14 DIAGNOSIS — L309 Dermatitis, unspecified: Secondary | ICD-10-CM | POA: Diagnosis not present

## 2019-11-24 DIAGNOSIS — H353132 Nonexudative age-related macular degeneration, bilateral, intermediate dry stage: Secondary | ICD-10-CM | POA: Diagnosis not present

## 2019-11-24 DIAGNOSIS — H35352 Cystoid macular degeneration, left eye: Secondary | ICD-10-CM | POA: Diagnosis not present

## 2020-01-05 ENCOUNTER — Ambulatory Visit: Payer: Medicare Other | Admitting: Family Medicine

## 2020-01-22 DIAGNOSIS — H40023 Open angle with borderline findings, high risk, bilateral: Secondary | ICD-10-CM | POA: Diagnosis not present

## 2020-01-27 ENCOUNTER — Encounter: Payer: Self-pay | Admitting: Family Medicine

## 2020-01-27 ENCOUNTER — Other Ambulatory Visit: Payer: Self-pay

## 2020-01-27 ENCOUNTER — Ambulatory Visit (INDEPENDENT_AMBULATORY_CARE_PROVIDER_SITE_OTHER): Payer: Medicare Other | Admitting: Family Medicine

## 2020-01-27 VITALS — BP 134/61 | HR 58 | Temp 96.6°F | Wt 173.0 lb

## 2020-01-27 DIAGNOSIS — I10 Essential (primary) hypertension: Secondary | ICD-10-CM | POA: Diagnosis not present

## 2020-01-27 DIAGNOSIS — I2510 Atherosclerotic heart disease of native coronary artery without angina pectoris: Secondary | ICD-10-CM

## 2020-01-27 DIAGNOSIS — E039 Hypothyroidism, unspecified: Secondary | ICD-10-CM | POA: Diagnosis not present

## 2020-01-27 DIAGNOSIS — E785 Hyperlipidemia, unspecified: Secondary | ICD-10-CM

## 2020-01-27 DIAGNOSIS — S0502XD Injury of conjunctiva and corneal abrasion without foreign body, left eye, subsequent encounter: Secondary | ICD-10-CM

## 2020-01-27 DIAGNOSIS — I4891 Unspecified atrial fibrillation: Secondary | ICD-10-CM | POA: Diagnosis not present

## 2020-01-27 DIAGNOSIS — M25561 Pain in right knee: Secondary | ICD-10-CM | POA: Diagnosis not present

## 2020-01-27 DIAGNOSIS — M1711 Unilateral primary osteoarthritis, right knee: Secondary | ICD-10-CM | POA: Diagnosis not present

## 2020-01-27 NOTE — Progress Notes (Signed)
Patient: Gary Mueller Male    DOB: 09-12-1941   79 y.o.   MRN: [id number] Visit Date: 01/27/2020  Today's Provider: Wilhemena Durie, MD   Chief Complaint  Patient presents with  . Hypertension   Subjective:     HPI  Pt now retired as of January 1st. He is doing well. Widowed. Daughter doing well but both sons in 71s with  Health issues. CAD in native artery  From 06/16/2019-All risk factors treated. Followed by dr Saralyn Pilar. Labs improving.  Hyperlipidemia, unspecified hyperlipidemia type From 06/16/2019-on Crestor. Labs improving.  Essential hypertension From 06/16/2019-On norvasc. Labs improving.  Adult hypothyroidism From 06/16/2019-On synthroid 141mcg. Labs improving.   Allergies  Allergen Reactions  . Codeine     GI intolerance; hallucinations   . Statins     Muscle pain     Current Outpatient Medications:  .  acetaminophen (TYLENOL) 500 MG tablet, Take 1,000 mg by mouth daily as needed for moderate pain., Disp: , Rfl:  .  amLODipine (NORVASC) 5 MG tablet, TAKE ONE TABLET BY MOUTH EVERY DAY, Disp: 90 tablet, Rfl: 3 .  amoxicillin (AMOXIL) 875 MG tablet, Take 1 tablet (875 mg total) by mouth 2 (two) times daily., Disp: 20 tablet, Rfl: 0 .  amoxicillin-clavulanate (AUGMENTIN) 875-125 MG tablet, Take 1 tablet by mouth 2 (two) times daily., Disp: 20 tablet, Rfl: 0 .  Ascorbic Acid (VITAMIN C) 1000 MG tablet, Take 1,000 mg by mouth every evening. , Disp: , Rfl:  .  aspirin EC 81 MG tablet, Take 81 mg by mouth every evening. , Disp: , Rfl:  .  cholecalciferol (VITAMIN D3) 25 MCG (1000 UT) tablet, Take 1,000 Units by mouth every evening., Disp: , Rfl:  .  diclofenac sodium (VOLTAREN) 1 % GEL, Apply 1 application topically as needed (knee pain)., Disp: , Rfl:  .  ezetimibe (ZETIA) 10 MG tablet, TAKE 1 TABLET BY MOUTH DAILY, Disp: 30 tablet, Rfl: 12 .  levothyroxine (SYNTHROID, LEVOTHROID) 100 MCG tablet, TAKE ONE TABLET BY MOUTH EVERY DAY, Disp: 90 tablet,  Rfl: 3 .  Multiple Vitamin (MULTIVITAMIN WITH MINERALS) TABS tablet, Take 1 tablet by mouth every evening. , Disp: , Rfl:  .  prednisoLONE acetate (PRED FORTE) 1 % ophthalmic suspension, Place 1 drop into the left eye every 6 (six) hours as needed. , Disp: , Rfl:  .  rosuvastatin (CRESTOR) 5 MG tablet, Take 1 tablet (5 mg total) by mouth daily., Disp: 90 tablet, Rfl: 3 .  sertraline (ZOLOFT) 50 MG tablet, Take 0.5 tablets (25 mg total) by mouth at bedtime., Disp: 45 tablet, Rfl: 3  Review of Systems  Constitutional: Negative for appetite change, chills and fever.  HENT: Negative.   Eyes: Positive for visual disturbance.       He had complication from [id number] cataract surgery and is now followed at [facility].  Respiratory: Negative for chest tightness, shortness of breath and wheezing.   Cardiovascular: Negative for chest pain and palpitations.  Gastrointestinal: Negative for abdominal pain, nausea and vomiting.  Endocrine: Negative.   Allergic/Immunologic: Negative.   Neurological: Negative.   Hematological: Negative.   Psychiatric/Behavioral: Negative.     Social History   Tobacco Use  . Smoking status: Former Smoker    Packs/day: 1.50    Types: Cigarettes    Quit date: 12/24/1964    Years since quitting: 55.1  . Smokeless tobacco: Never Used  Substance Use Topics  . Alcohol use: Yes    Comment:  1 beer or glass of wine monthly      Objective:   There were no vitals taken for this visit. There were no vitals filed for this visit.There is no height or weight on file to calculate BMI.   Physical Exam Vitals reviewed.  Constitutional:      Appearance: Normal appearance. He is well-developed.  HENT:     Head: Normocephalic and atraumatic.     Right Ear: External ear normal.     Left Ear: External ear normal.     Nose: Nose normal.  Eyes:     General: No scleral icterus.    Conjunctiva/sclera: Conjunctivae normal.  Neck:     Thyroid: No thyromegaly.     Vascular: No carotid  bruit.  Cardiovascular:     Rate and Rhythm: Normal rate and regular rhythm.     Heart sounds: Normal heart sounds.  Pulmonary:     Effort: Pulmonary effort is normal.     Breath sounds: Normal breath sounds.  Abdominal:     Palpations: Abdomen is soft.  Lymphadenopathy:     Cervical: No cervical adenopathy.  Skin:    General: Skin is warm and dry.  Neurological:     General: No focal deficit present.     Mental Status: He is alert and oriented to person, place, and time.  Psychiatric:        Mood and Affect: Mood normal.        Behavior: Behavior normal.        Thought Content: Thought content normal.        Judgment: Judgment normal.      No results found for any visits on 01/27/20.     Assessment & Plan    1. Essential hypertension  - CBC w/Diff/Platelet - Comprehensive Metabolic Panel (CMET) - Lipid panel - TSH  2. Hyperlipidemia, unspecified hyperlipidemia type Crestor/zetia - CBC w/Diff/Platelet - Comprehensive Metabolic Panel (CMET) - Lipid panel - TSH  3. CAD in native artery Risk factors treated. - CBC w/Diff/Platelet - Comprehensive Metabolic Panel (CMET) - Lipid panel - TSH  4. Adult hypothyroidism  - CBC w/Diff/Platelet - Comprehensive Metabolic Panel (CMET) - Lipid panel - TSH  5. Atrial fibrillation, unspecified type ([place])   6. Abrasion of left cornea, subsequent encounter Per [facility].   Follow up in 4-6 months for CPE.     Richard Cranford Mon, MD  [facility]

## 2020-01-28 DIAGNOSIS — I10 Essential (primary) hypertension: Secondary | ICD-10-CM | POA: Diagnosis not present

## 2020-01-28 DIAGNOSIS — I2510 Atherosclerotic heart disease of native coronary artery without angina pectoris: Secondary | ICD-10-CM | POA: Diagnosis not present

## 2020-01-28 DIAGNOSIS — E039 Hypothyroidism, unspecified: Secondary | ICD-10-CM | POA: Diagnosis not present

## 2020-01-28 DIAGNOSIS — E785 Hyperlipidemia, unspecified: Secondary | ICD-10-CM | POA: Diagnosis not present

## 2020-01-29 LAB — LIPID PANEL
Chol/HDL Ratio: 4.8 ratio (ref 0.0–5.0)
Cholesterol, Total: 193 mg/dL (ref 100–199)
HDL: 40 mg/dL (ref >39)
LDL Chol Calc (NIH): 134 mg/dL — ABNORMAL HIGH (ref 0–99)
Triglycerides: 103 mg/dL (ref 0–149)
VLDL Cholesterol Cal: 19 mg/dL (ref 5–40)

## 2020-01-29 LAB — CBC WITH DIFFERENTIAL/PLATELET
Basophils Absolute: 0 10*3/uL (ref 0.0–0.2)
Basos: 0 %
EOS (ABSOLUTE): 0.1 10*3/uL (ref 0.0–0.4)
Eos: 2 %
Hematocrit: 41.4 % (ref 37.5–51.0)
Hemoglobin: 14 g/dL (ref 13.0–17.7)
Immature Grans (Abs): 0 10*3/uL (ref 0.0–0.1)
Immature Granulocytes: 0 %
Lymphocytes Absolute: 1.9 10*3/uL (ref 0.7–3.1)
Lymphs: 37 %
MCH: 31.9 pg (ref 26.6–33.0)
MCHC: 33.8 g/dL (ref 31.5–35.7)
MCV: 94 fL (ref 79–97)
Monocytes Absolute: 0.4 10*3/uL (ref 0.1–0.9)
Monocytes: 7 %
Neutrophils Absolute: 2.7 10*3/uL (ref 1.4–7.0)
Neutrophils: 54 %
Platelets: 209 10*3/uL (ref 150–450)
RBC: 4.39 x10E6/uL (ref 4.14–5.80)
RDW: 11.6 % (ref 11.6–15.4)
WBC: 5 10*3/uL (ref 3.4–10.8)

## 2020-01-29 LAB — COMPREHENSIVE METABOLIC PANEL
ALT: 28 IU/L (ref 0–44)
AST: 25 IU/L (ref 0–40)
Albumin/Globulin Ratio: 2.3 — ABNORMAL HIGH (ref 1.2–2.2)
Albumin: 4.2 g/dL (ref 3.7–4.7)
Alkaline Phosphatase: 83 IU/L (ref 39–117)
BUN/Creatinine Ratio: 33 — ABNORMAL HIGH (ref 10–24)
BUN: 28 mg/dL — ABNORMAL HIGH (ref 8–27)
Bilirubin Total: 0.3 mg/dL (ref 0.0–1.2)
CO2: 24 mmol/L (ref 20–29)
Calcium: 9.4 mg/dL (ref 8.6–10.2)
Chloride: 107 mmol/L — ABNORMAL HIGH (ref 96–106)
Creatinine, Ser: 0.84 mg/dL (ref 0.76–1.27)
GFR calc Af Amer: 96 mL/min/{1.73_m2} (ref >59)
GFR calc non Af Amer: 83 mL/min/{1.73_m2} (ref >59)
Globulin, Total: 1.8 g/dL (ref 1.5–4.5)
Glucose: 103 mg/dL — ABNORMAL HIGH (ref 65–99)
Potassium: 4.2 mmol/L (ref 3.5–5.2)
Sodium: 143 mmol/L (ref 134–144)
Total Protein: 6 g/dL (ref 6.0–8.5)

## 2020-01-29 LAB — TSH: TSH: 1.29 u[IU]/mL (ref 0.450–4.500)

## 2020-01-30 ENCOUNTER — Telehealth: Payer: Self-pay | Admitting: *Deleted

## 2020-01-30 NOTE — Telephone Encounter (Signed)
-----   Message from Jerrol Banana., MD sent at 01/30/2020  9:24 AM EST ----- Labs in normal range.

## 2020-01-30 NOTE — Telephone Encounter (Signed)
Tried calling ppt with results. No answer and no vm. Okay for [facility] triage to give results.

## 2020-02-02 ENCOUNTER — Other Ambulatory Visit: Payer: Self-pay | Admitting: Family Medicine

## 2020-02-02 DIAGNOSIS — I10 Essential (primary) hypertension: Secondary | ICD-10-CM

## 2020-02-03 DIAGNOSIS — M1711 Unilateral primary osteoarthritis, right knee: Secondary | ICD-10-CM | POA: Diagnosis not present

## 2020-02-04 NOTE — Telephone Encounter (Signed)
Advised 

## 2020-02-10 DIAGNOSIS — M1711 Unilateral primary osteoarthritis, right knee: Secondary | ICD-10-CM | POA: Diagnosis not present

## 2020-02-13 ENCOUNTER — Other Ambulatory Visit: Payer: Self-pay | Admitting: Family Medicine

## 2020-02-17 DIAGNOSIS — M1711 Unilateral primary osteoarthritis, right knee: Secondary | ICD-10-CM | POA: Diagnosis not present

## 2020-02-23 DIAGNOSIS — H35352 Cystoid macular degeneration, left eye: Secondary | ICD-10-CM | POA: Diagnosis not present

## 2020-02-23 DIAGNOSIS — H353132 Nonexudative age-related macular degeneration, bilateral, intermediate dry stage: Secondary | ICD-10-CM | POA: Diagnosis not present

## 2020-03-03 DIAGNOSIS — I493 Ventricular premature depolarization: Secondary | ICD-10-CM | POA: Diagnosis not present

## 2020-03-03 DIAGNOSIS — R0602 Shortness of breath: Secondary | ICD-10-CM | POA: Diagnosis not present

## 2020-03-03 DIAGNOSIS — I2510 Atherosclerotic heart disease of native coronary artery without angina pectoris: Secondary | ICD-10-CM | POA: Diagnosis not present

## 2020-03-03 DIAGNOSIS — R079 Chest pain, unspecified: Secondary | ICD-10-CM | POA: Diagnosis not present

## 2020-03-03 DIAGNOSIS — E785 Hyperlipidemia, unspecified: Secondary | ICD-10-CM | POA: Diagnosis not present

## 2020-03-04 ENCOUNTER — Other Ambulatory Visit: Payer: Self-pay

## 2020-03-04 ENCOUNTER — Ambulatory Visit (INDEPENDENT_AMBULATORY_CARE_PROVIDER_SITE_OTHER): Payer: Medicare Other | Admitting: Family Medicine

## 2020-03-04 ENCOUNTER — Encounter: Payer: Self-pay | Admitting: Family Medicine

## 2020-03-04 VITALS — BP 111/65 | HR 53 | Temp 96.8°F | Resp 17 | Ht 72.0 in | Wt 172.3 lb

## 2020-03-04 DIAGNOSIS — E785 Hyperlipidemia, unspecified: Secondary | ICD-10-CM | POA: Diagnosis not present

## 2020-03-04 DIAGNOSIS — I4891 Unspecified atrial fibrillation: Secondary | ICD-10-CM

## 2020-03-04 DIAGNOSIS — I2510 Atherosclerotic heart disease of native coronary artery without angina pectoris: Secondary | ICD-10-CM

## 2020-03-04 DIAGNOSIS — I10 Essential (primary) hypertension: Secondary | ICD-10-CM | POA: Diagnosis not present

## 2020-03-04 DIAGNOSIS — K279 Peptic ulcer, site unspecified, unspecified as acute or chronic, without hemorrhage or perforation: Secondary | ICD-10-CM

## 2020-03-04 MED ORDER — PANTOPRAZOLE SODIUM 40 MG PO TBEC: 40.0000 mg | DELAYED_RELEASE_TABLET | Freq: Every day | ORAL | 2 refills | Status: DC

## 2020-03-04 NOTE — Progress Notes (Signed)
Patient: Gary Mueller Male    DOB: Mar 01, 1941   79 y.o.   MRN: [id number] Visit Date: 03/04/2020  Today's Provider: Wilhemena Durie, MD   Chief Complaint  Patient presents with  . Dizziness  . Abdominal Pain  . Emesis   Subjective:    Patient states that his has taken both COVID vaccines. Patient states that he is having dizziness, loss bowel movement (tarry stool) and vomiting (tarry vomit) that started on Monday. Patient states that he is having moderate abdominal pain and weakness. Patient saw Dr. Saralyn Pilar yesterday and was advised to see his pcp.   4 days ago he was just not feeling well and then developed black stools and then threw up blood a couple of times.  He states the smell was exactly like it was when his wife had a upper GI bleed several years ago. Since then he has had no further hematemesis but his stools remain dark until yesterday.  Not tarry.  They are back to normal color today.  He is still having some mild abdominal pain.  Allergies  Allergen Reactions  . Codeine     GI intolerance; hallucinations   . Statins     Muscle pain     Current Outpatient Medications:  .  acetaminophen (TYLENOL) 500 MG tablet, Take 1,000 mg by mouth daily as needed for moderate pain., Disp: , Rfl:  .  amLODipine (NORVASC) 5 MG tablet, TAKE ONE TABLET EVERY DAY, Disp: 90 tablet, Rfl: 3 .  Ascorbic Acid (VITAMIN C) 1000 MG tablet, Take 1,000 mg by mouth every evening. , Disp: , Rfl:  .  aspirin EC 81 MG tablet, Take 81 mg by mouth every evening. , Disp: , Rfl:  .  cholecalciferol (VITAMIN D3) 25 MCG (1000 UT) tablet, Take 1,000 Units by mouth every evening., Disp: , Rfl:  .  diclofenac sodium (VOLTAREN) 1 % GEL, Apply 1 application topically as needed (knee pain)., Disp: , Rfl:  .  levothyroxine (SYNTHROID) 100 MCG tablet, TAKE 1 TABLET EVERY DAY ON EMPTY STOMACHWITH A GLASS OF WATER AT LEAST 30-60 MINBEFORE BREAKFAST, Disp: 90 tablet, Rfl: 1 .  Multiple Vitamin  (MULTIVITAMIN WITH MINERALS) TABS tablet, Take 1 tablet by mouth every evening. , Disp: , Rfl:  .  amoxicillin (AMOXIL) 875 MG tablet, Take 1 tablet (875 mg total) by mouth 2 (two) times daily. (Patient not taking: Reported on 03/04/2020), Disp: 20 tablet, Rfl: 0 .  amoxicillin-clavulanate (AUGMENTIN) 875-125 MG tablet, Take 1 tablet by mouth 2 (two) times daily. (Patient not taking: Reported on 03/04/2020), Disp: 20 tablet, Rfl: 0 .  ezetimibe (ZETIA) 10 MG tablet, TAKE 1 TABLET BY MOUTH DAILY (Patient not taking: Reported on 03/04/2020), Disp: 30 tablet, Rfl: 12 .  prednisoLONE acetate (PRED FORTE) 1 % ophthalmic suspension, Place 1 drop into the left eye every 6 (six) hours as needed. , Disp: , Rfl:  .  rosuvastatin (CRESTOR) 5 MG tablet, Take 1 tablet (5 mg total) by mouth daily. (Patient not taking: Reported on 01/27/2020), Disp: 90 tablet, Rfl: 3 .  sertraline (ZOLOFT) 50 MG tablet, Take 0.5 tablets (25 mg total) by mouth at bedtime. (Patient not taking: Reported on 01/27/2020), Disp: 45 tablet, Rfl: 3  Review of Systems  Constitutional: Negative for appetite change, chills, fatigue and fever.  Eyes: Negative.   Respiratory: Negative for chest tightness, shortness of breath and wheezing.   Cardiovascular: Negative for chest pain and palpitations.  Gastrointestinal: Positive for  vomiting. Negative for abdominal pain, change in bowel habit and nausea.  Endocrine: Negative.   Allergic/Immunologic: Negative.   Neurological: Positive for dizziness and weakness.  Hematological: Negative.   Psychiatric/Behavioral: Negative.     Social History   Tobacco Use  . Smoking status: Former Smoker    Packs/day: 1.50    Types: Cigarettes    Quit date: 12/24/1964    Years since quitting: 55.2  . Smokeless tobacco: Never Used  Substance Use Topics  . Alcohol use: Yes    Comment: 1 beer or glass of wine monthly      Objective:   BP 111/65 (BP Location: Right Arm, Patient Position: Sitting, Cuff Size:  Normal)   Pulse (!) 53   Temp (!) 96.8 F (36 C) (Temporal)   Resp 17   Ht 6' (1.829 m)   Wt 172 lb 4.8 oz (78.2 kg)   SpO2 100%   BMI 23.37 kg/m  Vitals:   03/04/20 1134  BP: 111/65  Pulse: (!) 53  Resp: 17  Temp: (!) 96.8 F (36 C)  TempSrc: Temporal  SpO2: 100%  Weight: 172 lb 4.8 oz (78.2 kg)  Height: 6' (1.829 m)  Body mass index is 23.37 kg/m.   Physical Exam Vitals reviewed.  Constitutional:      Appearance: Normal appearance. He is well-developed.  HENT:     Head: Normocephalic and atraumatic.     Right Ear: External ear normal.     Left Ear: External ear normal.     Nose: Nose normal.  Eyes:     General: No scleral icterus.    Conjunctiva/sclera: Conjunctivae normal.  Neck:     Thyroid: No thyromegaly.     Vascular: No carotid bruit.  Cardiovascular:     Rate and Rhythm: Normal rate and regular rhythm.     Heart sounds: Normal heart sounds.  Pulmonary:     Effort: Pulmonary effort is normal.     Breath sounds: Normal breath sounds.  Abdominal:     General: Bowel sounds are normal.     Palpations: Abdomen is soft.     Tenderness: There is abdominal tenderness in the epigastric area. There is no guarding or rebound.  Lymphadenopathy:     Cervical: No cervical adenopathy.  Skin:    General: Skin is warm and dry.  Neurological:     General: No focal deficit present.     Mental Status: He is alert and oriented to person, place, and time.  Psychiatric:        Mood and Affect: Mood normal.        Behavior: Behavior normal.        Thought Content: Thought content normal.        Judgment: Judgment normal.      No results found for any visits on 03/04/20.     Assessment & Plan    1. PUD (peptic ulcer disease) Try pantoprazole 40 mg daily. Refer back to [facility].  Clinically this is PUD.  Will stop Voltaren gel although it is unlikely to be a problem.  He is on nothing else I can see that could be a GI irritant.  Refer back to GI for  consideration of EGD. - CBC w/Diff/Platelet - Lipase - Comprehensive Metabolic Panel (CMET) - H Pylori, IGM, IGG, IGA AB - pantoprazole (PROTONIX) 40 MG tablet; Take 1 tablet (40 mg total) by mouth daily.  Dispense: 30 tablet; Refill: 2 - Ambulatory referral to Gastroenterology  2. CAD in  native artery   3. Atrial fibrillation, unspecified type ([place]) Consider stopping at baby aspirin also if the pain continues  4. Essential hypertension On amlodipine  5. Hyperlipidemia, unspecified hyperlipidemia type On rosuvastatin.    Follow up in 1-2 months.    I,April Miller,acting as a scribe for Wilhemena Durie, MD.,have documented all relevant documentation on the behalf of Wilhemena Durie, MD,as directed by  Wilhemena Durie, MD while in the presence of Wilhemena Durie, MD.      Wilhemena Durie, MD  [facility]

## 2020-03-04 NOTE — Patient Instructions (Addendum)
Stop taking Voltaren gel. Follow up in 1-2 months.

## 2020-03-08 LAB — CBC WITH DIFFERENTIAL/PLATELET
Basophils Absolute: 0 10*3/uL (ref 0.0–0.2)
Basos: 0 %
EOS (ABSOLUTE): 0.1 10*3/uL (ref 0.0–0.4)
Eos: 2 %
Hematocrit: 34 % — ABNORMAL LOW (ref 37.5–51.0)
Hemoglobin: 11.1 g/dL — ABNORMAL LOW (ref 13.0–17.7)
Immature Grans (Abs): 0 10*3/uL (ref 0.0–0.1)
Immature Granulocytes: 0 %
Lymphocytes Absolute: 2 10*3/uL (ref 0.7–3.1)
Lymphs: 37 %
MCH: 31.1 pg (ref 26.6–33.0)
MCHC: 32.6 g/dL (ref 31.5–35.7)
MCV: 95 fL (ref 79–97)
Monocytes Absolute: 0.4 10*3/uL (ref 0.1–0.9)
Monocytes: 8 %
Neutrophils Absolute: 2.9 10*3/uL (ref 1.4–7.0)
Neutrophils: 53 %
Platelets: 203 10*3/uL (ref 150–450)
RBC: 3.57 x10E6/uL — ABNORMAL LOW (ref 4.14–5.80)
RDW: 12.3 % (ref 11.6–15.4)
WBC: 5.5 10*3/uL (ref 3.4–10.8)

## 2020-03-08 LAB — H PYLORI, IGM, IGG, IGA AB
H pylori, IgM Abs: <9 units (ref 0.0–8.9)
H. pylori, IgA Abs: <9 units (ref 0.0–8.9)
H. pylori, IgG AbS: 0.06 Index Value (ref 0.00–0.79)

## 2020-03-08 LAB — COMPREHENSIVE METABOLIC PANEL
ALT: 23 IU/L (ref 0–44)
AST: 20 IU/L (ref 0–40)
Albumin/Globulin Ratio: 2.3 — ABNORMAL HIGH (ref 1.2–2.2)
Albumin: 4.1 g/dL (ref 3.7–4.7)
Alkaline Phosphatase: 73 IU/L (ref 39–117)
BUN/Creatinine Ratio: 26 — ABNORMAL HIGH (ref 10–24)
BUN: 22 mg/dL (ref 8–27)
Bilirubin Total: 0.2 mg/dL (ref 0.0–1.2)
CO2: 25 mmol/L (ref 20–29)
Calcium: 9.4 mg/dL (ref 8.6–10.2)
Chloride: 106 mmol/L (ref 96–106)
Creatinine, Ser: 0.84 mg/dL (ref 0.76–1.27)
GFR calc Af Amer: 96 mL/min/{1.73_m2} (ref >59)
GFR calc non Af Amer: 83 mL/min/{1.73_m2} (ref >59)
Globulin, Total: 1.8 g/dL (ref 1.5–4.5)
Glucose: 95 mg/dL (ref 65–99)
Potassium: 4.2 mmol/L (ref 3.5–5.2)
Sodium: 143 mmol/L (ref 134–144)
Total Protein: 5.9 g/dL — ABNORMAL LOW (ref 6.0–8.5)

## 2020-03-08 LAB — LIPASE: Lipase: 51 U/L (ref 13–78)

## 2020-03-10 DIAGNOSIS — H401134 Primary open-angle glaucoma, bilateral, indeterminate stage: Secondary | ICD-10-CM | POA: Diagnosis not present

## 2020-03-25 DIAGNOSIS — M170 Bilateral primary osteoarthritis of knee: Secondary | ICD-10-CM | POA: Diagnosis not present

## 2020-03-25 DIAGNOSIS — Z96652 Presence of left artificial knee joint: Secondary | ICD-10-CM | POA: Diagnosis not present

## 2020-03-28 DIAGNOSIS — M1711 Unilateral primary osteoarthritis, right knee: Secondary | ICD-10-CM | POA: Insufficient documentation

## 2020-04-11 NOTE — Discharge Instructions (Signed)
Instructions after Total Knee Replacement   Gary Mueller [name], [name], M.D.     Dept. of Orthopaedics & Sports Medicine  [facility]  [address]  [place], [place]  [address]  Phone: [phone]   Fax: [phone]    DIET: Drink plenty of non-alcoholic fluids. Resume your normal diet. Include foods high in fiber.  ACTIVITY:  You may use crutches or a walker with weight-bearing as tolerated, unless instructed otherwise. You may be weaned off of the walker or crutches by your Physical Therapist.  Do NOT place pillows under the knee. Anything placed under the knee could limit your ability to straighten the knee.   Continue doing gentle exercises. Exercising will reduce the pain and swelling, increase motion, and prevent muscle weakness.   Please continue to use the TED compression stockings for 6 weeks. You may remove the stockings at night, but should reapply them in the morning. Do not drive or operate any equipment until instructed.  WOUND CARE:  Continue to use the PolarCare or ice packs periodically to reduce pain and swelling. You may bathe or shower after the staples are removed at the first office visit following surgery.  MEDICATIONS: You may resume your regular medications. Please take the pain medication as prescribed on the medication. Do not take pain medication on an empty stomach. You have been given a prescription for a blood thinner (Lovenox or Coumadin). Please take the medication as instructed. (NOTE: After completing a 2 week course of Lovenox, take one Enteric-coated aspirin once a day. This along with elevation will help reduce the possibility of phlebitis in your operated leg.) Do not drive or drink alcoholic beverages when taking pain medications.  CALL THE OFFICE FOR: Temperature above 101 degrees Excessive bleeding or drainage on the dressing. Excessive swelling, coldness, or paleness of the toes. Persistent nausea and vomiting.  FOLLOW-UP:  You  should have an appointment to return to the office in 10-14 days after surgery. Arrangements have been made for continuation of Physical Therapy (either home therapy or outpatient therapy).   [facility] Department Directory         [email or web address]       [email or web address]          Cardiology  Appointments: [place] - [phone] [place] - [phone]  Endocrinology  Appointments: [place] - [phone] [place] - [phone]  Gastroenterology  Appointments: [place] - [phone] [place] - [phone]        General Surgery   Appointments: [place] - [phone]  Internal Medicine/Family Medicine  Appointments: [place] - [phone] [place] - [phone] [place] - [phone]  Metabolic and Weigh Loss Surgery  Appointments: [place] - [phone]        Neurology  Appointments: [place] - [phone] [place] - [phone]  Neurosurgery  Appointments: [place] - [phone]  Obstetrics & Gynecology  Appointments: [place] - [phone] [place] - [phone]        Pediatrics  Appointments: [place] - [phone] [place] - [phone]  Physiatry  Appointments: [place] -[phone]  Physical Therapy  Appointments: [place] - [phone] [place] - [phone]        Podiatry  Appointments: [place] - [phone] [place] - [phone]  Pulmonology  Appointments: [place] - [phone]  Rheumatology  Appointments: [place] - [phone]        [place] Location: [facility]  [address] , [place]  [address]  [place] Location: [facility] [address] [place], [place]  [address]  [place] Location: [facility] [address] [place], [place]  [address]    

## 2020-04-13 ENCOUNTER — Ambulatory Visit: Payer: Self-pay | Admitting: Family Medicine

## 2020-04-22 ENCOUNTER — Other Ambulatory Visit: Payer: Self-pay

## 2020-04-22 ENCOUNTER — Encounter
Admission: RE | Admit: 2020-04-22 | Discharge: 2020-04-22 | Disposition: A | Discharge status: Home / Self Care | Payer: Medicare Other | Source: Ambulatory Visit | Attending: Orthopedic Surgery | Admitting: Orthopedic Surgery

## 2020-04-22 DIAGNOSIS — Z01818 Encounter for other preprocedural examination: Secondary | ICD-10-CM | POA: Insufficient documentation

## 2020-04-22 HISTORY — DX: Unilateral inguinal hernia, without obstruction or gangrene, not specified as recurrent: K40.90

## 2020-04-22 HISTORY — DX: Pneumonia, unspecified organism: J18.9

## 2020-04-22 LAB — CBC
HCT: 40.4 % (ref 39.0–52.0)
Hemoglobin: 13.4 g/dL (ref 13.0–17.0)
MCH: 31.3 pg (ref 26.0–34.0)
MCHC: 33.2 g/dL (ref 30.0–36.0)
MCV: 94.4 fL (ref 80.0–100.0)
Platelets: 174 10*3/uL (ref 150–400)
RBC: 4.28 MIL/uL (ref 4.22–5.81)
RDW: 13.8 % (ref 11.5–15.5)
WBC: 6.8 10*3/uL (ref 4.0–10.5)
nRBC: 0 % (ref 0.0–0.2)

## 2020-04-22 LAB — TYPE AND SCREEN
ABO/RH(D): B POS
Antibody Screen: NEGATIVE

## 2020-04-22 LAB — URINALYSIS, ROUTINE W REFLEX MICROSCOPIC
Bilirubin Urine: NEGATIVE
Glucose, UA: NEGATIVE mg/dL
Hgb urine dipstick: NEGATIVE
Ketones, ur: NEGATIVE mg/dL
Leukocytes,Ua: NEGATIVE
Nitrite: NEGATIVE
Protein, ur: NEGATIVE mg/dL
Specific Gravity, Urine: >1.03 — ABNORMAL HIGH (ref 1.005–1.030)
pH: 6 (ref 5.0–8.0)

## 2020-04-22 LAB — SURGICAL PCR SCREEN
MRSA, PCR: NEGATIVE
Staphylococcus aureus: POSITIVE — AB

## 2020-04-22 LAB — COMPREHENSIVE METABOLIC PANEL
ALT: 23 U/L (ref 0–44)
AST: 25 U/L (ref 15–41)
Albumin: 4.3 g/dL (ref 3.5–5.0)
Alkaline Phosphatase: 69 U/L (ref 38–126)
Anion gap: 6 (ref 5–15)
BUN: 26 mg/dL — ABNORMAL HIGH (ref 8–23)
CO2: 26 mmol/L (ref 22–32)
Calcium: 9.4 mg/dL (ref 8.9–10.3)
Chloride: 106 mmol/L (ref 98–111)
Creatinine, Ser: 0.75 mg/dL (ref 0.61–1.24)
GFR calc Af Amer: >60 mL/min (ref >60)
GFR calc non Af Amer: >60 mL/min (ref >60)
Glucose, Bld: 93 mg/dL (ref 70–99)
Potassium: 4.1 mmol/L (ref 3.5–5.1)
Sodium: 138 mmol/L (ref 135–145)
Total Bilirubin: 0.8 mg/dL (ref 0.3–1.2)
Total Protein: 6.8 g/dL (ref 6.5–8.1)

## 2020-04-22 LAB — APTT: aPTT: 31 seconds (ref 24–36)

## 2020-04-22 LAB — PROTIME-INR
INR: 1.1 (ref 0.8–1.2)
Prothrombin Time: 13.5 seconds (ref 11.4–15.2)

## 2020-04-22 LAB — C-REACTIVE PROTEIN: CRP: 0.5 mg/dL (ref <1.0)

## 2020-04-22 LAB — SEDIMENTATION RATE: Sed Rate: 8 mm/hr (ref 0–20)

## 2020-04-22 NOTE — Patient Instructions (Addendum)
COVID TESTING Date: Wednesday, May 5 Testing site:  [facility] Hours:  [id number] am - 1:00 pm Once you are tested, you are asked to stay quarantined (avoiding public places) until after your surgery.   Your procedure is scheduled on: Friday, May 7 Report to Day Surgery on the 2nd floor of the [facility]. To find out your arrival time, please call [phone] between 1PM - 3PM on: Thursday, May 6  REMEMBER: Instructions that are not followed completely may result in serious medical risk, up to and including death; or upon the discretion of your surgeon and anesthesiologist your surgery may need to be rescheduled.  Do not eat food after midnight the night before surgery.  No gum chewing, lozengers or hard candies.  You may however, drink CLEAR liquids up to 2 hours before you are scheduled to arrive for your surgery. Do not drink anything within 2 hours of your scheduled arrival time.  Clear liquids include: - water  - apple juice without pulp - gatorade (not RED) - black coffee or tea (Do NOT add milk or creamers to the coffee or tea) Do NOT drink anything that is not on this list.  ENSURE PRE-SURGERY CARBOHYDRATE DRINK:  Complete drinking 2 hours prior to scheduled arrival time.  TAKE THESE MEDICATIONS THE MORNING OF SURGERY WITH A SIP OF WATER:  1.  Amlodipine 2.  Levothyroxine  Stop Anti-inflammatories (NSAIDS) such as Advil, Aleve, Ibuprofen, Motrin, Naproxen, Naprosyn and Aspirin based products such as Excedrin, Goodys Powder, BC Powder. (May take Tylenol or Acetaminophen if needed.)  Stop ANY OVER THE COUNTER supplements until after surgery. (May continue multivitamin.)  No Alcohol for 24 hours before or after surgery.  On the morning of surgery brush your teeth with toothpaste and water, you may rinse your mouth with mouthwash if you wish. Do not swallow any toothpaste or mouthwash.  Do not wear jewelry.  Do not  wear lotions, powders, or perfumes.   Do not shave 48 hours prior to surgery.   Hearing aids and dentures may not be worn into surgery.  Do not bring valuables to the hospital. [facility] is not responsible for any missing/lost belongings or valuables.   Use CHG Soap as directed on instruction sheet.  Notify your doctor if there is any change in your medical condition (cold, fever, infection).  Wear comfortable clothing (specific to your surgery type) to the hospital.  Plan for stool softeners for home use; pain medications have a tendency to cause constipation. You can also help prevent constipation by eating foods high in fiber such as fruits and vegetables and drinking plenty of fluids as your diet allows.  After surgery, you can help prevent lung complications by doing breathing exercises.  Take deep breaths and cough every 1-2 hours. Your doctor may order a device called an Incentive Spirometer to help you take deep breaths. When coughing or sneezing, hold a pillow firmly against your incision with both hands. This is called "splinting." Doing this helps protect your incision. It also decreases belly discomfort.  If you are being admitted to the hospital overnight, leave your suitcase in the car. After surgery it may be brought to your room.  Please call the [place] Dept. at [phone] if you have any questions about these instructions.  Visitation Policy:  Patients undergoing a surgery or procedure may have one family member or support person with them as long as that person is not  COVID-19 positive or experiencing its symptoms.  That person may remain in the waiting area during the procedure.  Inpatient Visitation Update:  Two designated support people may visit a patient during visiting hours 7 am to 8 pm. It must be the same two designated people for the duration of the patient stay. The visitors may come and go during the day, and there is no switching out  to have different visitors. A mask must be worn at all times, including in the patient room.

## 2020-04-23 LAB — URINE CULTURE
Culture: NO GROWTH
Special Requests: NORMAL

## 2020-04-28 ENCOUNTER — Other Ambulatory Visit: Payer: Self-pay

## 2020-04-28 ENCOUNTER — Other Ambulatory Visit
Admission: RE | Admit: 2020-04-28 | Discharge: 2020-04-28 | Disposition: A | Discharge status: Home / Self Care | Payer: Medicare Other | Source: Ambulatory Visit | Attending: Orthopedic Surgery | Admitting: Orthopedic Surgery

## 2020-04-28 LAB — SARS CORONAVIRUS 2 (TAT 6-24 HRS): SARS Coronavirus 2: NEGATIVE

## 2020-04-28 MED ORDER — PROPOFOL 10 MG/ML IV BOLUS
INTRAVENOUS | Status: AC
  Filled 2020-04-28: qty 20

## 2020-04-29 MED ORDER — TRANEXAMIC ACID-NACL 1000-0.7 MG/100ML-% IV SOLN: 1000.0000 mg | INTRAVENOUS | Status: DC

## 2020-04-29 MED ORDER — CEFAZOLIN SODIUM-DEXTROSE 2-4 GM/100ML-% IV SOLN: 2.0000 g | INTRAVENOUS | Status: DC

## 2020-04-30 ENCOUNTER — Inpatient Hospital Stay: Payer: Medicare Other

## 2020-04-30 ENCOUNTER — Inpatient Hospital Stay
Admission: RE | Admit: 2020-04-30 | Discharge: 2020-05-02 | DRG: 470 | Disposition: A | Discharge status: Home Health | Payer: Medicare Other | Attending: Orthopedic Surgery | Admitting: Orthopedic Surgery

## 2020-04-30 ENCOUNTER — Other Ambulatory Visit: Payer: Self-pay

## 2020-04-30 ENCOUNTER — Encounter
Admission: RE | Disposition: A | Discharge status: Home Health | Payer: Self-pay | Source: Home / Self Care | Attending: Orthopedic Surgery

## 2020-04-30 ENCOUNTER — Inpatient Hospital Stay: Payer: Medicare Other | Admitting: Anesthesiology

## 2020-04-30 ENCOUNTER — Encounter: Payer: Self-pay | Admitting: Orthopedic Surgery

## 2020-04-30 DIAGNOSIS — M1711 Unilateral primary osteoarthritis, right knee: Principal | ICD-10-CM | POA: Diagnosis present

## 2020-04-30 DIAGNOSIS — I2510 Atherosclerotic heart disease of native coronary artery without angina pectoris: Secondary | ICD-10-CM | POA: Diagnosis present

## 2020-04-30 DIAGNOSIS — I10 Essential (primary) hypertension: Secondary | ICD-10-CM | POA: Diagnosis present

## 2020-04-30 DIAGNOSIS — Z79899 Other long term (current) drug therapy: Secondary | ICD-10-CM | POA: Diagnosis not present

## 2020-04-30 DIAGNOSIS — Z8249 Family history of ischemic heart disease and other diseases of the circulatory system: Secondary | ICD-10-CM

## 2020-04-30 DIAGNOSIS — Z79890 Hormone replacement therapy (postmenopausal): Secondary | ICD-10-CM

## 2020-04-30 DIAGNOSIS — Z96651 Presence of right artificial knee joint: Secondary | ICD-10-CM | POA: Diagnosis not present

## 2020-04-30 DIAGNOSIS — Z20822 Contact with and (suspected) exposure to covid-19: Secondary | ICD-10-CM | POA: Diagnosis present

## 2020-04-30 DIAGNOSIS — H9193 Unspecified hearing loss, bilateral: Secondary | ICD-10-CM | POA: Diagnosis present

## 2020-04-30 DIAGNOSIS — Z87891 Personal history of nicotine dependence: Secondary | ICD-10-CM

## 2020-04-30 DIAGNOSIS — Z85828 Personal history of other malignant neoplasm of skin: Secondary | ICD-10-CM

## 2020-04-30 DIAGNOSIS — Z96659 Presence of unspecified artificial knee joint: Secondary | ICD-10-CM

## 2020-04-30 DIAGNOSIS — Z96652 Presence of left artificial knee joint: Secondary | ICD-10-CM | POA: Diagnosis present

## 2020-04-30 DIAGNOSIS — Z885 Allergy status to narcotic agent status: Secondary | ICD-10-CM | POA: Diagnosis not present

## 2020-04-30 DIAGNOSIS — E039 Hypothyroidism, unspecified: Secondary | ICD-10-CM | POA: Diagnosis present

## 2020-04-30 DIAGNOSIS — Z8701 Personal history of pneumonia (recurrent): Secondary | ICD-10-CM

## 2020-04-30 DIAGNOSIS — K219 Gastro-esophageal reflux disease without esophagitis: Secondary | ICD-10-CM | POA: Diagnosis present

## 2020-04-30 DIAGNOSIS — M25761 Osteophyte, right knee: Secondary | ICD-10-CM | POA: Diagnosis present

## 2020-04-30 DIAGNOSIS — E785 Hyperlipidemia, unspecified: Secondary | ICD-10-CM | POA: Diagnosis present

## 2020-04-30 DIAGNOSIS — Z471 Aftercare following joint replacement surgery: Secondary | ICD-10-CM | POA: Diagnosis not present

## 2020-04-30 DIAGNOSIS — Z888 Allergy status to other drugs, medicaments and biological substances status: Secondary | ICD-10-CM

## 2020-04-30 DIAGNOSIS — F418 Other specified anxiety disorders: Secondary | ICD-10-CM | POA: Diagnosis not present

## 2020-04-30 HISTORY — PX: KNEE ARTHROPLASTY: SHX992

## 2020-04-30 LAB — ABO/RH: ABO/RH(D): B POS

## 2020-04-30 SURGERY — ARTHROPLASTY, KNEE, TOTAL, USING IMAGELESS COMPUTER-ASSISTED NAVIGATION
Anesthesia: Spinal | Site: Knee | Laterality: Right

## 2020-04-30 MED ORDER — PANTOPRAZOLE SODIUM 40 MG PO TBEC
40.0000 mg | DELAYED_RELEASE_TABLET | Freq: Two times a day (BID) | ORAL | Status: DC
  Administered 2020-04-30 – 2020-05-02 (×4): 40 mg via ORAL
  Filled 2020-04-30 (×4): qty 1

## 2020-04-30 MED ORDER — ROSUVASTATIN CALCIUM 5 MG PO TABS
5.0000 mg | ORAL_TABLET | Freq: Every day | ORAL | Status: DC
  Administered 2020-05-01 – 2020-05-02 (×2): 5 mg via ORAL
  Filled 2020-04-30 (×2): qty 1

## 2020-04-30 MED ORDER — LEVOTHYROXINE SODIUM 100 MCG PO TABS
100.0000 ug | ORAL_TABLET | Freq: Every day | ORAL | Status: DC
  Administered 2020-05-01 – 2020-05-02 (×2): 100 ug via ORAL
  Filled 2020-04-30 (×2): qty 1

## 2020-04-30 MED ORDER — OXYCODONE HCL 5 MG PO TABS
5.0000 mg | ORAL_TABLET | ORAL | Status: DC | PRN
  Filled 2020-04-30: qty 1

## 2020-04-30 MED ORDER — CELECOXIB 200 MG PO CAPS
ORAL_CAPSULE | ORAL | Status: AC
  Administered 2020-04-30: 07:00:00 400 mg via ORAL
  Filled 2020-04-30: qty 2

## 2020-04-30 MED ORDER — CELECOXIB 200 MG PO CAPS
200.0000 mg | ORAL_CAPSULE | Freq: Two times a day (BID) | ORAL | Status: DC
  Administered 2020-04-30 – 2020-05-02 (×4): 200 mg via ORAL
  Filled 2020-04-30 (×4): qty 1

## 2020-04-30 MED ORDER — ALUM & MAG HYDROXIDE-SIMETH 200-200-20 MG/5ML PO SUSP: 30.0000 mL | ORAL | Status: DC | PRN

## 2020-04-30 MED ORDER — CHLORHEXIDINE GLUCONATE 4 % EX LIQD: 60.0000 mL | Freq: Once | CUTANEOUS | Status: DC

## 2020-04-30 MED ORDER — ACETAMINOPHEN 325 MG PO TABS: 325.0000 mg | ORAL_TABLET | Freq: Four times a day (QID) | ORAL | Status: DC | PRN

## 2020-04-30 MED ORDER — GABAPENTIN 300 MG PO CAPS
ORAL_CAPSULE | ORAL | Status: AC
  Administered 2020-04-30: 300 mg via ORAL
  Filled 2020-04-30: qty 1

## 2020-04-30 MED ORDER — BUPIVACAINE LIPOSOME 1.3 % IJ SUSP
INTRAMUSCULAR | Status: AC
  Filled 2020-04-30: qty 20

## 2020-04-30 MED ORDER — LIDOCAINE HCL (PF) 2 % IJ SOLN
INTRAMUSCULAR | Status: AC
  Filled 2020-04-30: qty 5

## 2020-04-30 MED ORDER — METOCLOPRAMIDE HCL 10 MG PO TABS
10.0000 mg | ORAL_TABLET | Freq: Three times a day (TID) | ORAL | Status: DC
  Administered 2020-04-30 – 2020-05-02 (×7): 10 mg via ORAL
  Filled 2020-04-30 (×7): qty 1

## 2020-04-30 MED ORDER — GABAPENTIN 300 MG PO CAPS: 300.0000 mg | ORAL_CAPSULE | Freq: Once | ORAL | Status: AC

## 2020-04-30 MED ORDER — LACTATED RINGERS IV SOLN: INTRAVENOUS | Status: DC

## 2020-04-30 MED ORDER — DEXAMETHASONE SODIUM PHOSPHATE 10 MG/ML IJ SOLN: 8.0000 mg | Freq: Once | INTRAMUSCULAR | Status: AC

## 2020-04-30 MED ORDER — SODIUM CHLORIDE FLUSH 0.9 % IV SOLN
INTRAVENOUS | Status: AC
  Filled 2020-04-30: qty 40

## 2020-04-30 MED ORDER — TRANEXAMIC ACID-NACL 1000-0.7 MG/100ML-% IV SOLN
INTRAVENOUS | Status: DC | PRN
  Administered 2020-04-30: 1000 mg via INTRAVENOUS

## 2020-04-30 MED ORDER — ONDANSETRON HCL 4 MG/2ML IJ SOLN: 4.0000 mg | Freq: Four times a day (QID) | INTRAMUSCULAR | Status: DC | PRN

## 2020-04-30 MED ORDER — BUPIVACAINE HCL (PF) 0.25 % IJ SOLN
INTRAMUSCULAR | Status: AC
  Filled 2020-04-30: qty 60

## 2020-04-30 MED ORDER — BUPIVACAINE HCL (PF) 0.25 % IJ SOLN
INTRAMUSCULAR | Status: DC | PRN
  Administered 2020-04-30: 60 mL

## 2020-04-30 MED ORDER — METOCLOPRAMIDE HCL 5 MG/ML IJ SOLN: 5.0000 mg | Freq: Three times a day (TID) | INTRAMUSCULAR | Status: DC | PRN

## 2020-04-30 MED ORDER — CEFAZOLIN SODIUM-DEXTROSE 2-4 GM/100ML-% IV SOLN
2.0000 g | Freq: Four times a day (QID) | INTRAVENOUS | Status: AC
  Administered 2020-04-30 – 2020-05-01 (×4): 2 g via INTRAVENOUS
  Filled 2020-04-30 (×4): qty 100

## 2020-04-30 MED ORDER — TRAMADOL HCL 50 MG PO TABS: 50.0000 mg | ORAL_TABLET | ORAL | Status: DC | PRN

## 2020-04-30 MED ORDER — DIPHENHYDRAMINE HCL 12.5 MG/5ML PO ELIX: 12.5000 mg | ORAL_SOLUTION | ORAL | Status: DC | PRN

## 2020-04-30 MED ORDER — METOCLOPRAMIDE HCL 10 MG PO TABS: 5.0000 mg | ORAL_TABLET | Freq: Three times a day (TID) | ORAL | Status: DC | PRN

## 2020-04-30 MED ORDER — TRANEXAMIC ACID-NACL 1000-0.7 MG/100ML-% IV SOLN
INTRAVENOUS | Status: AC
  Administered 2020-04-30: 12:00:00 1000 mg via INTRAVENOUS
  Filled 2020-04-30: qty 100

## 2020-04-30 MED ORDER — VITAMIN E 180 MG (400 UNIT) PO CAPS
400.0000 [IU] | ORAL_CAPSULE | Freq: Every day | ORAL | Status: DC
  Administered 2020-05-01 – 2020-05-02 (×2): 400 [IU] via ORAL
  Filled 2020-04-30 (×3): qty 1

## 2020-04-30 MED ORDER — FENTANYL CITRATE (PF) 100 MCG/2ML IJ SOLN
INTRAMUSCULAR | Status: DC | PRN
  Administered 2020-04-30 (×4): 25 ug via INTRAVENOUS
  Administered 2020-04-30: 12.5 ug via INTRAVENOUS

## 2020-04-30 MED ORDER — PROPOFOL 500 MG/50ML IV EMUL
INTRAVENOUS | Status: AC
  Filled 2020-04-30: qty 50

## 2020-04-30 MED ORDER — ENOXAPARIN SODIUM 30 MG/0.3ML ~~LOC~~ SOLN
30.0000 mg | Freq: Two times a day (BID) | SUBCUTANEOUS | Status: DC
  Administered 2020-05-01 – 2020-05-02 (×3): 30 mg via SUBCUTANEOUS
  Filled 2020-04-30 (×3): qty 0.3

## 2020-04-30 MED ORDER — FAMOTIDINE 20 MG PO TABS: 20.0000 mg | ORAL_TABLET | Freq: Once | ORAL | Status: AC

## 2020-04-30 MED ORDER — EPHEDRINE SULFATE 50 MG/ML IJ SOLN
INTRAMUSCULAR | Status: DC | PRN
  Administered 2020-04-30 (×2): 10 mg via INTRAVENOUS

## 2020-04-30 MED ORDER — DEXMEDETOMIDINE HCL IN NACL 200 MCG/50ML IV SOLN
INTRAVENOUS | Status: DC | PRN
  Administered 2020-04-30 (×2): 2 ug via INTRAVENOUS
  Administered 2020-04-30 (×3): 4 ug via INTRAVENOUS

## 2020-04-30 MED ORDER — GLYCOPYRROLATE 0.2 MG/ML IJ SOLN
INTRAMUSCULAR | Status: DC | PRN
Start: 2020-04-30 — End: 2020-04-30
  Administered 2020-04-30: .2 mg via INTRAVENOUS

## 2020-04-30 MED ORDER — CEFAZOLIN SODIUM-DEXTROSE 2-4 GM/100ML-% IV SOLN
INTRAVENOUS | Status: AC
  Filled 2020-04-30: qty 100

## 2020-04-30 MED ORDER — BISACODYL 10 MG RE SUPP: 10.0000 mg | Freq: Every day | RECTAL | Status: DC | PRN

## 2020-04-30 MED ORDER — ACETAMINOPHEN 10 MG/ML IV SOLN
INTRAVENOUS | Status: DC | PRN
  Administered 2020-04-30: 1000 mg via INTRAVENOUS

## 2020-04-30 MED ORDER — CELECOXIB 200 MG PO CAPS: 400.0000 mg | ORAL_CAPSULE | Freq: Once | ORAL | Status: AC

## 2020-04-30 MED ORDER — MENTHOL 3 MG MT LOZG
1.0000 | LOZENGE | OROMUCOSAL | Status: DC | PRN
  Administered 2020-05-01: 3 mg via ORAL
  Filled 2020-04-30 (×2): qty 9

## 2020-04-30 MED ORDER — PROPOFOL 500 MG/50ML IV EMUL
INTRAVENOUS | Status: DC | PRN
  Administered 2020-04-30: 50 ug/kg/min via INTRAVENOUS

## 2020-04-30 MED ORDER — PHENOL 1.4 % MT LIQD
1.0000 | OROMUCOSAL | Status: DC | PRN
  Filled 2020-04-30: qty 177

## 2020-04-30 MED ORDER — GABAPENTIN 300 MG PO CAPS
300.0000 mg | ORAL_CAPSULE | Freq: Every day | ORAL | Status: DC
  Administered 2020-04-30 – 2020-05-01 (×2): 300 mg via ORAL
  Filled 2020-04-30 (×2): qty 1

## 2020-04-30 MED ORDER — OXYCODONE HCL 5 MG PO TABS: 10.0000 mg | ORAL_TABLET | ORAL | Status: DC | PRN

## 2020-04-30 MED ORDER — ENSURE ENLIVE PO LIQD: 296.0000 mL | Freq: Once | ORAL | Status: DC

## 2020-04-30 MED ORDER — PROPOFOL 10 MG/ML IV BOLUS
INTRAVENOUS | Status: DC | PRN
  Administered 2020-04-30 (×2): 20 mg via INTRAVENOUS

## 2020-04-30 MED ORDER — FENTANYL CITRATE (PF) 100 MCG/2ML IJ SOLN
INTRAMUSCULAR | Status: AC
  Filled 2020-04-30: qty 2

## 2020-04-30 MED ORDER — ADULT MULTIVITAMIN W/MINERALS CH: 1.0000 | ORAL_TABLET | Freq: Every evening | ORAL | Status: DC

## 2020-04-30 MED ORDER — TRANEXAMIC ACID-NACL 1000-0.7 MG/100ML-% IV SOLN
INTRAVENOUS | Status: AC
  Filled 2020-04-30: qty 100

## 2020-04-30 MED ORDER — CEFAZOLIN SODIUM-DEXTROSE 2-3 GM-%(50ML) IV SOLR
INTRAVENOUS | Status: DC | PRN
  Administered 2020-04-30: 2 g via INTRAVENOUS

## 2020-04-30 MED ORDER — FERROUS SULFATE 325 (65 FE) MG PO TABS
325.0000 mg | ORAL_TABLET | Freq: Two times a day (BID) | ORAL | Status: DC
  Administered 2020-04-30 – 2020-05-02 (×4): 325 mg via ORAL
  Filled 2020-04-30 (×4): qty 1

## 2020-04-30 MED ORDER — OXYCODONE HCL 5 MG PO TABS
ORAL_TABLET | ORAL | Status: AC
  Administered 2020-04-30: 13:00:00 5 mg via ORAL
  Filled 2020-04-30: qty 1

## 2020-04-30 MED ORDER — DEXAMETHASONE SODIUM PHOSPHATE 10 MG/ML IJ SOLN
INTRAMUSCULAR | Status: AC
  Administered 2020-04-30: 07:00:00 8 mg via INTRAVENOUS
  Filled 2020-04-30: qty 1

## 2020-04-30 MED ORDER — SODIUM CHLORIDE 0.9 % IV SOLN
INTRAVENOUS | Status: DC | PRN
  Administered 2020-04-30: 30 ug/min via INTRAVENOUS

## 2020-04-30 MED ORDER — SODIUM CHLORIDE 0.9 % IV SOLN
INTRAVENOUS | Status: DC | PRN
  Administered 2020-04-30: 60 mL

## 2020-04-30 MED ORDER — NEOMYCIN-POLYMYXIN B GU 40-200000 IR SOLN
Status: DC | PRN
  Administered 2020-04-30: 16 mL

## 2020-04-30 MED ORDER — ASCORBIC ACID 500 MG PO TABS
1000.0000 mg | ORAL_TABLET | Freq: Every day | ORAL | Status: DC
  Administered 2020-05-01 – 2020-05-02 (×2): 1000 mg via ORAL
  Filled 2020-04-30 (×2): qty 2

## 2020-04-30 MED ORDER — SENNOSIDES-DOCUSATE SODIUM 8.6-50 MG PO TABS
1.0000 | ORAL_TABLET | Freq: Two times a day (BID) | ORAL | Status: DC
  Administered 2020-04-30 – 2020-05-02 (×4): 1 via ORAL
  Filled 2020-04-30 (×4): qty 1

## 2020-04-30 MED ORDER — SODIUM CHLORIDE 0.9 % IV SOLN: INTRAVENOUS | Status: DC

## 2020-04-30 MED ORDER — PROPOFOL 10 MG/ML IV BOLUS
INTRAVENOUS | Status: AC
  Filled 2020-04-30: qty 20

## 2020-04-30 MED ORDER — ACETAMINOPHEN 10 MG/ML IV SOLN
INTRAVENOUS | Status: AC
  Filled 2020-04-30: qty 100

## 2020-04-30 MED ORDER — FLEET ENEMA 7-19 GM/118ML RE ENEM: 1.0000 | ENEMA | Freq: Once | RECTAL | Status: DC | PRN

## 2020-04-30 MED ORDER — ACETAMINOPHEN 10 MG/ML IV SOLN
1000.0000 mg | Freq: Four times a day (QID) | INTRAVENOUS | Status: AC
  Administered 2020-04-30 – 2020-05-01 (×4): 1000 mg via INTRAVENOUS
  Filled 2020-04-30 (×4): qty 100

## 2020-04-30 MED ORDER — TRANEXAMIC ACID-NACL 1000-0.7 MG/100ML-% IV SOLN: 1000.0000 mg | Freq: Once | INTRAVENOUS | Status: AC

## 2020-04-30 MED ORDER — ONDANSETRON HCL 4 MG/2ML IJ SOLN: 4.0000 mg | Freq: Once | INTRAMUSCULAR | Status: DC | PRN

## 2020-04-30 MED ORDER — HYDROMORPHONE HCL 1 MG/ML IJ SOLN: 0.5000 mg | INTRAMUSCULAR | Status: DC | PRN

## 2020-04-30 MED ORDER — FENTANYL CITRATE (PF) 100 MCG/2ML IJ SOLN: 25.0000 ug | INTRAMUSCULAR | Status: DC | PRN

## 2020-04-30 MED ORDER — FAMOTIDINE 20 MG PO TABS
ORAL_TABLET | ORAL | Status: AC
  Administered 2020-04-30: 07:00:00 20 mg via ORAL
  Filled 2020-04-30: qty 1

## 2020-04-30 MED ORDER — AMLODIPINE BESYLATE 5 MG PO TABS
5.0000 mg | ORAL_TABLET | Freq: Every day | ORAL | Status: DC
  Administered 2020-05-01 – 2020-05-02 (×2): 5 mg via ORAL
  Filled 2020-04-30 (×2): qty 1

## 2020-04-30 MED ORDER — ONDANSETRON HCL 4 MG PO TABS: 4.0000 mg | ORAL_TABLET | Freq: Four times a day (QID) | ORAL | Status: DC | PRN

## 2020-04-30 MED ORDER — MAGNESIUM HYDROXIDE 400 MG/5ML PO SUSP
30.0000 mL | Freq: Every day | ORAL | Status: DC
  Administered 2020-05-01 – 2020-05-02 (×2): 30 mL via ORAL
  Filled 2020-04-30 (×2): qty 30

## 2020-04-30 SURGICAL SUPPLY — 71 items
BATTERY INSTRU NAVIGATION (MISCELLANEOUS) ×12 IMPLANT
BLADE SAW 70X12.5 (BLADE) ×3 IMPLANT
BLADE SAW 90X13X1.19 OSCILLAT (BLADE) ×3 IMPLANT
BLADE SAW 90X25X1.19 OSCILLAT (BLADE) ×3 IMPLANT
CANISTER SUCT 3000ML PPV (MISCELLANEOUS) ×3 IMPLANT
CEMENT HV SMART SET (Cement) ×4 IMPLANT
CEMENT TIBIA MBT SIZE 5 (Knees) IMPLANT
COOLER POLAR GLACIER W/PUMP (MISCELLANEOUS) ×3 IMPLANT
COVER WAND RF STERILE (DRAPES) ×3 IMPLANT
CUFF TOURN SGL QUICK 24 (TOURNIQUET CUFF) ×2
CUFF TRNQT CYL 24X4X16.5-23 (TOURNIQUET CUFF) IMPLANT
DRAPE 3/4 80X56 (DRAPES) ×3 IMPLANT
DRSG DERMACEA 8X12 NADH (GAUZE/BANDAGES/DRESSINGS) ×3 IMPLANT
DRSG OPSITE POSTOP 4X12 (GAUZE/BANDAGES/DRESSINGS) ×2 IMPLANT
DRSG OPSITE POSTOP 4X14 (GAUZE/BANDAGES/DRESSINGS) ×3 IMPLANT
DRSG TEGADERM 4X4.75 (GAUZE/BANDAGES/DRESSINGS) ×3 IMPLANT
DURAPREP 26ML APPLICATOR (WOUND CARE) ×6 IMPLANT
ELECT REM PT RETURN 9FT ADLT (ELECTROSURGICAL) ×3
ELECTRODE REM PT RTRN 9FT ADLT (ELECTROSURGICAL) ×1 IMPLANT
EX-PIN ORTHOLOCK NAV 4X150 (PIN) ×6 IMPLANT
FEMUR SIGMA PS SZ 4.0 R (Femur) ×2 IMPLANT
GLOVE BIO SURGEON STRL SZ7.5 (GLOVE) ×6 IMPLANT
GLOVE BIOGEL M STRL SZ7.5 (GLOVE) ×6 IMPLANT
GLOVE BIOGEL PI IND STRL 7.5 (GLOVE) ×1 IMPLANT
GLOVE BIOGEL PI INDICATOR 7.5 (GLOVE) ×2
GLOVE INDICATOR 8.0 STRL GRN (GLOVE) ×3 IMPLANT
GOWN STRL REUS W/ TWL LRG LVL3 (GOWN DISPOSABLE) ×2 IMPLANT
GOWN STRL REUS W/ TWL XL LVL3 (GOWN DISPOSABLE) ×1 IMPLANT
GOWN STRL REUS W/TWL LRG LVL3 (GOWN DISPOSABLE) ×4
GOWN STRL REUS W/TWL XL LVL3 (GOWN DISPOSABLE) ×2
HEMOVAC 400CC 10FR (MISCELLANEOUS) ×3 IMPLANT
HOLDER FOLEY CATH W/STRAP (MISCELLANEOUS) ×3 IMPLANT
HOOD PEEL AWAY FLYTE STAYCOOL (MISCELLANEOUS) ×6 IMPLANT
KIT TURNOVER KIT A (KITS) ×3 IMPLANT
KNIFE SCULPS 14X20 (INSTRUMENTS) ×3 IMPLANT
LABEL OR SOLS (LABEL) ×3 IMPLANT
MANIFOLD NEPTUNE II (INSTRUMENTS) ×3 IMPLANT
NDL SAFETY ECLIPSE 18X1.5 (NEEDLE) ×1 IMPLANT
NDL SPNL 20GX3.5 QUINCKE YW (NEEDLE) ×2 IMPLANT
NEEDLE HYPO 18GX1.5 SHARP (NEEDLE) ×2
NEEDLE SPNL 20GX3.5 QUINCKE YW (NEEDLE) ×6 IMPLANT
NS IRRIG 500ML POUR BTL (IV SOLUTION) ×3 IMPLANT
PACK TOTAL KNEE (MISCELLANEOUS) ×3 IMPLANT
PAD WRAPON POLAR KNEE (MISCELLANEOUS) ×1 IMPLANT
PATELLA DOME PFC 41MM (Knees) ×2 IMPLANT
PENCIL SMOKE EVACUATOR COATED (MISCELLANEOUS) ×3 IMPLANT
PENCIL SMOKE ULTRAEVAC 22 CON (MISCELLANEOUS) ×3 IMPLANT
PIN DRILL QUICK PACK ×3 IMPLANT
PIN FIXATION 1/8DIA X 3INL (PIN) ×9 IMPLANT
PLATE ROT INSERT 12.5MM SIZE 4 (Plate) ×2 IMPLANT
PULSAVAC PLUS IRRIG FAN TIP (DISPOSABLE) ×3
SOL .9 NS 3000ML IRR  AL (IV SOLUTION) ×2
SOL .9 NS 3000ML IRR UROMATIC (IV SOLUTION) ×1 IMPLANT
SOL PREP PVP 2OZ (MISCELLANEOUS) ×3
SOLUTION PREP PVP 2OZ (MISCELLANEOUS) ×1 IMPLANT
SPONGE DRAIN TRACH 4X4 STRL 2S (GAUZE/BANDAGES/DRESSINGS) ×3 IMPLANT
STAPLER SKIN PROX 35W (STAPLE) ×3 IMPLANT
STRAP TIBIA SHORT (MISCELLANEOUS) ×3 IMPLANT
SUCTION FRAZIER HANDLE 10FR (MISCELLANEOUS) ×2
SUCTION TUBE FRAZIER 10FR DISP (MISCELLANEOUS) ×1 IMPLANT
SUT VIC AB 0 CT1 36 (SUTURE) ×6 IMPLANT
SUT VIC AB 1 CT1 36 (SUTURE) ×6 IMPLANT
SUT VIC AB 2-0 CT2 27 (SUTURE) ×3 IMPLANT
SYR 20ML LL LF (SYRINGE) ×3 IMPLANT
SYR 30ML LL (SYRINGE) ×6 IMPLANT
TIBIA MBT CEMENT SIZE 5 (Knees) ×3 IMPLANT
TIP FAN IRRIG PULSAVAC PLUS (DISPOSABLE) ×1 IMPLANT
TOWEL OR 17X26 4PK STRL BLUE (TOWEL DISPOSABLE) ×3 IMPLANT
TOWER CARTRIDGE SMART MIX (DISPOSABLE) ×3 IMPLANT
TRAY FOLEY MTR SLVR 16FR STAT (SET/KITS/TRAYS/PACK) ×3 IMPLANT
WRAPON POLAR PAD KNEE (MISCELLANEOUS) ×3

## 2020-04-30 NOTE — Anesthesia Preprocedure Evaluation (Addendum)
Anesthesia Evaluation  Patient identified by MRN, date of birth, ID band Patient awake    Reviewed: Allergy & Precautions, NPO status , Patient's Chart, lab work & pertinent test results  History of Anesthesia Complications Negative for: history of anesthetic complications  Airway Mallampati: II       Dental   Pulmonary neg sleep apnea, neg COPD, Not current smoker, former smoker,           Cardiovascular hypertension, Pt. on medications (-) Past MI and (-) CHF + dysrhythmias (hx of afib) Atrial Fibrillation (-) Valvular Problems/Murmurs     Neuro/Psych neg Seizures Anxiety Depression    GI/Hepatic Neg liver ROS, GERD  Medicated and Controlled,  Endo/Other  neg diabetesHypothyroidism   Renal/GU negative Renal ROS     Musculoskeletal   Abdominal   Peds  Hematology   Anesthesia Other Findings   Reproductive/Obstetrics                             Anesthesia Physical Anesthesia Plan  ASA: III  Anesthesia Plan: General   Post-op Pain Management:    Induction:   PONV Risk Score and Plan: 2  Airway Management Planned: LMA  Additional Equipment:   Intra-op Plan:   Post-operative Plan:   Informed Consent: I have reviewed the patients History and Physical, chart, labs and discussed the procedure including the risks, benefits and alternatives for the proposed anesthesia with the patient or authorized representative who has indicated his/her understanding and acceptance.       Plan Discussed with:   Anesthesia Plan Comments:        Anesthesia Quick Evaluation

## 2020-04-30 NOTE — Op Note (Signed)
OPERATIVE NOTE  DATE OF SURGERY:  04/30/2020  PATIENT NAME:  ZAMARIAN KWAK   DOB: 06-Jul-1941  MRN: [id number]  PRE-OPERATIVE DIAGNOSIS: Degenerative arthrosis of the right knee, primary  POST-OPERATIVE DIAGNOSIS:  Same  PROCEDURE:  Right total knee arthroplasty using computer-assisted navigation  SURGEON:  Marciano Sequin. M.D.  ANESTHESIA: spinal  ESTIMATED BLOOD LOSS: 50 mL  FLUIDS REPLACED: 700 mL of crystalloid  TOURNIQUET TIME: 101 minutes  DRAINS: 2 medium Hemovac drains  SOFT TISSUE RELEASES: Anterior cruciate ligament, posterior cruciate ligament, deep and superficial medial collateral ligament, patellofemoral ligament  IMPLANTS UTILIZED: DePuy PFC Sigma size 4 posterior stabilized femoral component (cemented), size 5 MBT tibial component (cemented), 41 mm 3 peg oval dome patella (cemented), and a 12.5 mm stabilized rotating platform polyethylene insert.  INDICATIONS FOR SURGERY: GEON CLAUDE is a 79 y.o. year old male with a long history of progressive knee pain. X-rays demonstrated severe degenerative changes in tricompartmental fashion. The patient had not seen any significant improvement despite conservative nonsurgical intervention. After discussion of the risks and benefits of surgical intervention, the patient expressed understanding of the risks benefits and agree with plans for total knee arthroplasty.   The risks, benefits, and alternatives were discussed at length including but not limited to the risks of infection, bleeding, nerve injury, stiffness, blood clots, the need for revision surgery, cardiopulmonary complications, among others, and they were willing to proceed.  PROCEDURE IN DETAIL: The patient was brought into the operating room and, after adequate spinal anesthesia was achieved, a tourniquet was placed on the patient's upper thigh. The patient's knee and leg were cleaned and prepped with alcohol and DuraPrep and draped in the usual sterile fashion.  A "timeout" was performed as per usual protocol. The lower extremity was exsanguinated using an Esmarch, and the tourniquet was inflated to 300 mmHg. An anterior longitudinal incision was made followed by a standard mid vastus approach. The deep fibers of the medial collateral ligament were elevated in a subperiosteal fashion off of the medial flare of the tibia so as to maintain a continuous soft tissue sleeve. The patella was subluxed laterally and the patellofemoral ligament was incised. Inspection of the knee demonstrated severe degenerative changes with full-thickness loss of articular cartilage. Osteophytes were debrided using a rongeur. Anterior and posterior cruciate ligaments were excised. Two 4.0 mm Schanz pins were inserted in the femur and into the tibia for attachment of the array of trackers used for computer-assisted navigation. Hip center was identified using a circumduction technique. Distal landmarks were mapped using the computer. The distal femur and proximal tibia were mapped using the computer. The distal femoral cutting guide was positioned using computer-assisted navigation so as to achieve a 5 distal valgus cut. The femur was sized and it was felt that a size 4 femoral component was appropriate. A size 4 femoral cutting guide was positioned and the anterior cut was performed and verified using the computer. This was followed by completion of the posterior and chamfer cuts. Femoral cutting guide for the central box was then positioned in the center box cut was performed.  Attention was then directed to the proximal tibia. Medial and lateral menisci were excised. The extramedullary tibial cutting guide was positioned using computer-assisted navigation so as to achieve a 0 varus-valgus alignment and 0 posterior slope. The cut was performed and verified using the computer. The proximal tibia was sized and it was felt that a size 5 tibial tray was appropriate. Tibial and femoral trials  were  inserted followed by insertion of a 10 mm polyethylene insert.  The knee was felt to be tight both in flexion and in extension.  Trials were removed and the extra medullary tibial cutting guide was repositioned so as to resect an additional 2 mm of bone from the tibia.  Trial components were reinserted followed by insertion of a 12.5 mm polyethylene trial. The knee was felt to be tight medially. A Cobb elevator was used to elevate the superficial fibers of the medial collateral ligament.  This allowed for excellent mediolateral soft tissue balancing both in flexion and in full extension. Finally, the patella was cut and prepared so as to accommodate a 41 mm 3 peg oval dome patella. A patella trial was placed and the knee was placed through a range of motion with excellent patellar tracking appreciated. The femoral trial was removed after debridement of posterior osteophytes. The central post-hole for the tibial component was reamed followed by insertion of a keel punch. Tibial trials were then removed. Cut surfaces of bone were irrigated with copious amounts of normal saline with antibiotic solution using pulsatile lavage and then suctioned dry. Polymethylmethacrylate cement was prepared in the usual fashion using a vacuum mixer. Cement was applied to the cut surface of the proximal tibia as well as along the undersurface of a size 5 MBT tibial component. Tibial component was positioned and impacted into place. Excess cement was removed using [employer] fast streamer. Cement was then applied to the cut surfaces of the femur as well as along the posterior flanges of the size 4 femoral component. The femoral component was positioned and impacted into place. Excess cement was removed using [employer] fast streamer. A 12.5 mm polyethylene trial was inserted and the knee was brought into full extension with steady axial compression applied. Finally, cement was applied to the backside of a 41 mm 3 peg oval dome patella and the patellar  component was positioned and patellar clamp applied. Excess cement was removed using [employer] fast streamer. After adequate curing of the cement, the tourniquet was deflated after a total tourniquet time of 101 minutes. Hemostasis was achieved using electrocautery. The knee was irrigated with copious amounts of normal saline with antibiotic solution using pulsatile lavage and then suctioned dry. 20 mL of 1.3% Exparel and 60 mL of 0.25% Marcaine in 40 mL of normal saline was injected along the posterior capsule, medial and lateral gutters, and along the arthrotomy site. A 12.5 mm stabilized rotating platform polyethylene insert was inserted and the knee was placed through a range of motion with excellent mediolateral soft tissue balancing appreciated and excellent patellar tracking noted. 2 medium drains were placed in the wound bed and brought out through separate stab incisions. The medial parapatellar portion of the incision was reapproximated using interrupted sutures of #1 Vicryl. Subcutaneous tissue was approximated in layers using first #0 Vicryl followed #2-0 Vicryl. The skin was approximated with skin staples. A sterile dressing was applied.  The patient tolerated the procedure well and was transported to the recovery room in stable condition.    Marua Qin P. Holley Bouche., M.D.

## 2020-04-30 NOTE — H&P (Signed)
ORTHOPAEDIC HISTORY & PHYSICAL Progress Notes Gary Mueller, [place] - 04/23/2020 9:15 AM EDT [facility] Chief Complaint:   Chief Complaint  Patient presents with  . Knee Pain  H & P RIGHT KNEE   History of Present Illness:   Gary Mueller is a 79 y.o. male that presents to clinic today for his preoperative history and evaluation. Patient presents unaccompanied. The patient is scheduled to undergo a right total knee arthroplasty on 04/30/20 by Dr. Marry Guan. He reports a long history of medial knee pain. He reports associated swelling with some giving way of the knee. He denies associated numbness or tingling, denies locking of the knee.   The patient's symptoms have progressed to the point that they decrease his quality of life. The patient has previously undergone conservative treatment including NSAIDS and injections to the knee, as well as a right knee arthroscopy, without adequate control of his symptoms.  Patient denies history of back surgery, blood clots, or significant cardiac history.  Past Medical, Surgical, Family, Social History, Allergies, Medications:   Past Medical History:  Past Medical History:  Diagnosis Date  . Anxiety  . Arthritis  affects right knee and right hand  . CME (cystoid macular edema), left 07/07/2019  . Coronary artery disease  Insignificant coronary artery disease by cardiac catheterization 12/01/09 revealing 30% stenosis left main.  Marland Kitchen Hypertension  . Hypothyroidism  . Nonexudative age-related macular degeneration, bilateral, intermediate dry stage 07/07/2019  . Premature ventricular contractions  Occasional   Past Surgical History:  Past Surgical History:  Procedure Laterality Date  . CHOLECYSTECTOMY 1967  . HERNIA REPAIR Bilateral  . INJECTION INTRAVITREAL PHARMACOLOGIC AGENT Left 06/02/2019  ASK OS  . KNEE ARTHROSCOPY Left  Left Knee Arthroscopy, partial menisectomy, and chondroplasty 02/14/12   . Left total knee arthoplasty using computer assisted navigation Left 04.04.16  . LENS EYE SURGERY Left 11/28/2018  CE and PCIOL OS (elsewhere)  . LENS EYE SURGERY Right 09/2018  CE and PCIOL OD (elsewhere)  . REPOSITIONING INTRAOCULAR LENS Left 06/10/2019  Procedure: L- REPOSITIONING OF INTRAOCULAR LENS PROSTHESIS, REQUIRING AN INCISION (SEPARATE PROCEDURE); Surgeon: Phil Dopp, MD; Location: [facility] OR; Service: Ophthalmology; Laterality: Left;  . Right knee arthroscopy, partial medial meniscectomy, and chondroplasty 01/02/2018  Dr Marry Guan  . VASECTOMY   Current Medications:  Current Outpatient Medications  Medication Sig Dispense Refill  . amLODIPine (NORVASC) 5 MG tablet Take 5 mg by mouth once daily  . ascorbic acid, vitamin C, (VITAMIN C) 1000 MG tablet Take 1,000 mg by mouth once daily  . lutein 10 mg Tab Take 1 tablet by mouth once daily  . vitamin E 400 UNIT capsule Take 400 Units by mouth once daily  . levothyroxine (SYNTHROID) 100 MCG tablet TAKE 1 TABLET EVERY DAY ON EMPTY STOMACHWITH A GLASS OF WATER AT LEAST 30-60 MINBEFORE BREAKFAST  . multivitamin tablet Take 1 tablet by mouth once daily.  . pantoprazole (PROTONIX) 40 MG DR tablet  . rosuvastatin (CRESTOR) 5 MG tablet   No current facility-administered medications for this visit.   Allergies:  Allergies  Allergen Reactions  . Codeine Hallucination  . Statins-Hmg-Coa Reductase Inhibitors Muscle Pain   Social History:  Social History   Socioeconomic History  . Marital status: Widowed  Spouse name: Lelon Frohlich  . Number of children: 3  . Years of education: 70  . Highest education level: Not on file  Occupational History  . Occupation: Animator- Investment banker, operational  Tobacco Use  .  Smoking status: Former Smoker  Packs/day: 3.00  Years: 5.00  Pack years: 15.00  Types: Cigarettes  Quit date: 1965  Years since quitting: 56.3  . Smokeless tobacco: Never Used  Vaping Use  . Vaping Use: Former  Substance and  Sexual Activity  . Alcohol use: No  Alcohol/week: 0.0 standard drinks  . Drug use: No  . Sexual activity: Defer  Partners: Female  Other Topics Concern  . Not on file  Social History Narrative  . Not on file   Social Determinants of Health   Financial Resource Strain:  . Difficulty of Paying Living Expenses:  Food Insecurity:  . Worried About Charity fundraiser in the Last Year:  . Arboriculturist in the Last Year:  Transportation Needs:  . Film/video editor (Medical):  Marland Kitchen Lack of Transportation (Non-Medical):  Physical Activity:  . Days of Exercise per Week:  . Minutes of Exercise per Session:  Stress:  . Feeling of Stress :  Social Connections:  . Frequency of Communication with Friends and Family:  . Frequency of Social Gatherings with Friends and Family:  . Attends Religious Services:  . Active Member of Clubs or Organizations:  . Attends Archivist Meetings:  Marland Kitchen Marital Status:   Family History:  Family History  Problem Relation Age of Onset  . Osteoporosis (Thinning of bones) Mother  . High blood pressure (Hypertension) Mother  . Myocardial Infarction (Heart attack) Maternal Grandfather  . Glaucoma Neg Hx  . Macular degeneration Neg Hx  . Diabetes Neg Hx  . Blindness Neg Hx   Review of Systems:   A 10+ ROS was performed, reviewed, and the pertinent orthopaedic findings are documented in the HPI.   Physical Examination:   BP 130/70  Ht 182.9 cm (6')  Wt 77.5 kg (170 lb 12.8 oz)  BMI 23.16 kg/m   Patient is a well-developed, well-nourished male in no acute distress. Patient has normal mood and affect. Patient is alert and oriented to person, place, and time.   HEENT: Atraumatic, normocephalic. Pupils equal and reactive to light. Extraocular motion intact. Noninjected sclera.  Cardiovascular: Regular rate and rhythm, with no murmurs, rubs, or gallops. Distal pulses palpable.  Respiratory: Lungs clear to auscultation bilaterally.    Right Knee: Soft tissue swelling: mild Effusion: none Erythema: none Crepitance: mild Tenderness: medial Alignment: relative varus Mediolateral laxity: medial pseudolaxity Posterior sag: negative Patellar tracking: Good tracking without evidence of subluxation or tilt Atrophy: No significant atrophy.  Quadriceps tone was good. Range of motion: 0/8/117 degrees   Sensation intact over the saphenous, lateral sural cutaneous, superficial fibular, and deep fibular nerve distributions.  Tests Performed/Reviewed:  X-rays  No new radiographs were obtained today. Previous radiographs of the right knee from 03/25/20 were reviewed and revealed narrowing of the medial cartilage space with bone-on-bone articulation and associated varus alignment. Osteophyte formation is noted. Subchondral sclerosis is noted. No evidence of fracture or dislocation.   Impression:   ICD-10-CM  1. Primary osteoarthritis of right knee M17.11   Plan:   The patient has end-stage degenerative changes of the right knee. It was explained to the patient that the condition is progressive in nature. Having failed conservative treatment, the patient has elected to proceed with a total joint arthroplasty. The patient will undergo a total joint arthroplasty with Dr. Marry Guan. The risks of surgery, including blood clot and infection, were discussed with the patient. Measures to reduce these risks, including the use of anticoagulation, perioperative antibiotics, and  early ambulation were discussed. The importance of postoperative physical therapy was discussed with the patient. The patient elects to proceed with surgery. The patient is instructed to stop all blood thinners prior to surgery. The patient is instructed to call the hospital the day before surgery to learn of the proper arrival time.   Contact our office with any questions or concerns. Follow up as indicated, or sooner should any new problems arise, if conditions  worsen, or if they are otherwise concerned.   Gary Fudge, PA-C [facility] [address] [place], [place] [address] Phone: [phone]  This note was generated in part with voice recognition software and I apologize for any typographical errors that were not detected and corrected.   Electronically signed by Gary Fudge, PA at 04/26/2020 10:43 AM EDT

## 2020-04-30 NOTE — Anesthesia Procedure Notes (Signed)
Spinal  Patient location during procedure: OR Start time: 04/30/2020 7:31 AM End time: 04/30/2020 7:47 AM Staffing Anesthesiologist: Gunnar Fusi, MD Resident/CRNA: Allean Found, CRNA Preanesthetic Checklist Completed: patient identified, IV checked, site marked, risks and benefits discussed, surgical consent, monitors and equipment checked, pre-op evaluation and timeout performed Spinal Block Patient position: sitting Prep: Betadine Patient monitoring: heart rate, continuous pulse ox, blood pressure and cardiac monitor Approach: midline Location: L4-5 Injection technique: single-shot Needle Needle type: Introducer and Pencan  Needle gauge: 24 G Needle length: 9 cm Needle insertion depth: 7.5 cm Assessment Sensory level: T6 Additional Notes Negative paresthesia. Negative blood return. Positive free-flowing CSF. Expiration date of kit checked and confirmed. Patient tolerated procedure well, without complications.

## 2020-04-30 NOTE — Transfer of Care (Signed)
Immediate Anesthesia Transfer of Care Note  Patient: Gary Mueller  Procedure(s) Performed: COMPUTER ASSISTED TOTAL KNEE ARTHROPLASTY (Right Knee)  Patient Location: PACU  Anesthesia Type:Spinal  Level of Consciousness: awake, alert  and oriented  Airway & Oxygen Therapy: Patient connected to face mask oxygen  Post-op Assessment: Post -op Vital signs reviewed and stable  Post vital signs: stable  Last Vitals:  Vitals Value Taken Time  BP 101/54 04/30/20 1148  Temp    Pulse 66 04/30/20 1150  Resp 23 04/30/20 1150  SpO2 97 % 04/30/20 1150  Vitals shown include unvalidated device data.  Last Pain:  Vitals:   04/30/20 0629  TempSrc: Tympanic  PainSc: 5          Complications: No apparent anesthesia complications

## 2020-04-30 NOTE — H&P (Signed)
The patient has been re-examined, and the chart reviewed, and there have been no interval changes to the documented history and physical.    The risks, benefits, and alternatives have been discussed at length. The patient expressed understanding of the risks benefits and agreed with plans for surgical intervention.  Lotus Gover P. Annabelle Rexroad, [name] M.D.    

## 2020-04-30 NOTE — Progress Notes (Signed)
Initial Nutrition Assessment  DOCUMENTATION CODES:   Not applicable  INTERVENTION:   Recommend Ensure Enlive po BID, each supplement provides 350 kcal and 20 grams of protein  Recommend MVI daily   NUTRITION DIAGNOSIS:   Increased nutrient needs related to post-op healing as evidenced by increased estimated needs.  GOAL:   Patient will meet greater than or equal to 90% of their needs  MONITOR:   PO intake, Supplement acceptance, Labs, Weight trends, Skin, I & O's  REASON FOR ASSESSMENT:   Malnutrition Screening Tool    ASSESSMENT:   79 y/o male with h/o CAD and HTN who is s/p right total knee arthroplasty 5/7   Met with pt in room today. Pt reports fairly good appetite and oral intake at baseline. Pt ate 100% of his lunch today. Pt reports that his UBW used to be between 185-190lbs. Pt reports gradual weight loss over the past several years r/t the loss of his wife; pt reports that he eats out a lot less now. Per chart, pt appears weight stable for the past year. RD discussed with pt the importance of adequate nutrition needed for post op healing. Recommended supplements between meals to prevent loss of lean muscle.   Medications reviewed and include: vitamin C, lovenox, ferrous sulfate, synthroid, Mg, reglan, MVI, protonix, cefazolin, senokot, vitamin E, NaCl '@100ml' /hr  Labs reviewed:   NUTRITION - FOCUSED PHYSICAL EXAM:    Most Recent Value  Orbital Region  No depletion  Upper Arm Region  Moderate depletion  Thoracic and Lumbar Region  Mild depletion  Buccal Region  No depletion  Temple Region  Mild depletion  Clavicle Bone Region  Moderate depletion  Clavicle and Acromion Bone Region  Moderate depletion  Scapular Bone Region  Mild depletion  Dorsal Hand  No depletion  Patellar Region  No depletion  Anterior Thigh Region  No depletion  Posterior Calf Region  No depletion  Edema (RD Assessment)  None  Hair  Reviewed  Eyes  Reviewed  Mouth  Reviewed  Skin   Reviewed  Nails  Reviewed     Diet Order:   Diet Order            Diet regular Room service appropriate? Yes; Fluid consistency: Thin  Diet effective now             EDUCATION NEEDS:   Education needs have been addressed  Skin:  Skin Assessment: Reviewed RN Assessment(incision R knee)  Last BM:  pta  Height:   Ht Readings from Last 1 Encounters:  04/30/20 6' (1.829 m)    Weight:   Wt Readings from Last 1 Encounters:  04/30/20 78 kg    Ideal Body Weight:  80.9 kg  BMI:  Body mass index is 23.32 kg/m.  Estimated Nutritional Needs:   Kcal:  2000-2300kcal/day  Protein:  100-115g/day  Fluid:  >2L/day  Koleen Distance MS, RD, LDN Please refer to [facility] for RD and/or RD on-call/weekend/after hours pager

## 2020-04-30 NOTE — Evaluation (Signed)
Physical Therapy Evaluation Patient Details Name: Gary Mueller MRN: [id number] DOB: August 20, 1941 Today's Date: 04/30/2020   History of Present Illness  79 y/o s/p R TKA 04/30/20.  Clinical Impression  Pt showed great motivation with POD0 PT exam and had very good results with all metrics.  He had nearly 90* of AROM, easily performed SLRs, did not need physical assist with mobility, maintained stable vitals and was able to ambulate ~75 ft with consistent walker motion (minimal R WBing hesitancy).  Pt reports he has taken up throw rugs, has been doing "prehab" exercises and generally shows great trajectory per initial PT interaction.  Pt will have family support when first returning home and should be able to transition home once medically ready for d/c.    Follow Up Recommendations Home health PT;Follow surgeon's recommendation for DC plan and follow-up therapies;Supervision - Intermittent    Equipment Recommendations  Rolling walker with 5" wheels;3in1 (PT)    Recommendations for Other Services       Precautions / Restrictions Precautions Precautions: Fall;Knee Precaution Booklet Issued: Yes (comment)(HEP) Restrictions Weight Bearing Restrictions: Yes RLE Weight Bearing: Weight bearing as tolerated      Mobility  Bed Mobility Overal bed mobility: Independent             General bed mobility comments: Pt able to transition to sitting EOB confidently and w/o assist  Transfers Overall transfer level: Modified independent Equipment used: Rolling walker (2 wheeled)             General transfer comment: cuing for UE use/set up and sequencing.  He did try to sit before squared/backed up with recliner - cues for safety awareness  Ambulation/Gait Ambulation/Gait assistance: Supervision Gait Distance (Feet): 75 Feet Assistive device: Rolling walker (2 wheeled)       General Gait Details: Pt with mild hesitancy on R LE, but able to maintain consistent walker motion w/o  excessive UE reliance.  No buckling, stable vitals, good overall execution for first post-op bout of ambulation.  Stairs            Wheelchair Mobility    Modified Rankin (Stroke Patients Only)       Balance Overall balance assessment: Modified Independent                                           Pertinent Vitals/Pain Pain Assessment: 0-10 Pain Score: 3 (increases as expected with ROM, but POD0 appropriate)    Home Living Family/patient expects to be discharged to:: Private residence Living Arrangements: Alone(wife passed 2 years ago) Available Help at Discharge: Family(children will be covering 24/7 initially, 3 live locally) Type of Home: House Home Access: Stairs to enter Entrance Stairs-Rails: Left Entrance Stairs-Number of Steps: 3 Home Layout: Multi-level;Able to live on main level with bedroom/bathroom Home Equipment: Walker - 4 wheels      Prior Function Level of Independence: Independent         Comments: Pt does not need AD, drives, manages the home, runs errands, etc     Hand Dominance        Extremity/Trunk Assessment   Upper Extremity Assessment Upper Extremity Assessment: Overall WFL for tasks assessed    Lower Extremity Assessment Lower Extremity Assessment: Overall WFL for tasks assessed(expected post-op weakness, confident SLRs w/o warm up)       Communication   Communication: No difficulties;HOH  Cognition Arousal/Alertness:  Awake/alert Behavior During Therapy: WFL for tasks assessed/performed Overall Cognitive Status: Within Functional Limits for tasks assessed                                        General Comments      Exercises Total Joint Exercises Ankle Circles/Pumps: AROM;10 reps Quad Sets: Strengthening;10 reps Short Arc Quad: AROM;Strengthening;10 reps Heel Slides: AROM;Strengthening;10 reps Hip ABduction/ADduction: Strengthening;10 reps Straight Leg Raises: AROM;10 reps Knee  Flexion: PROM;5 reps Goniometric ROM: 0-94 (89 AROM)   Assessment/Plan    PT Assessment Patient needs continued PT services  PT Problem List Decreased strength;Decreased range of motion;Decreased activity tolerance;Decreased balance;Decreased mobility;Decreased coordination;Decreased knowledge of precautions;Decreased safety awareness;Decreased knowledge of use of DME;Pain       PT Treatment Interventions DME instruction;Gait training;Stair training;Functional mobility training;Therapeutic exercise;Therapeutic activities;Balance training;Neuromuscular re-education;Patient/family education    PT Goals (Current goals can be found in the Care Plan section)  Acute Rehab PT Goals Patient Stated Goal: pt very motivated to do all he needs to maximize success PT Goal Formulation: With patient Time For Goal Achievement: 05/14/20 Potential to Achieve Goals: Good    Frequency BID   Barriers to discharge        Co-evaluation               AM-PAC PT "6 Clicks" Mobility  Outcome Measure Help needed turning from your back to your side while in a flat bed without using bedrails?: None Help needed moving from lying on your back to sitting on the side of a flat bed without using bedrails?: None Help needed moving to and from a bed to a chair (including a wheelchair)?: None Help needed standing up from a chair using your arms (e.g., wheelchair or bedside chair)?: A Little Help needed to walk in hospital room?: A Little Help needed climbing 3-5 steps with a railing? : A Little 6 Click Score: 21    End of Session Equipment Utilized During Treatment: Gait belt Activity Tolerance: Patient tolerated treatment well Patient left: in chair;with call bell/phone within reach(notified nursing of malfunctioning alarm hook up) Nurse Communication: Mobility status PT Visit Diagnosis: Muscle weakness (generalized) (M62.81);Difficulty in walking, not elsewhere classified (R26.2);Pain Pain - Right/Left:  Right Pain - part of body: Knee    Time: [id number] PT Time Calculation (min) (ACUTE ONLY): 49 min   Charges:   PT Evaluation $PT Eval Low Complexity: 1 Low PT Treatments $Gait Training: 8-22 mins $Therapeutic Exercise: 8-22 mins        Kreg Shropshire, DPT 04/30/2020, 4:39 PM

## 2020-04-30 NOTE — Anesthesia Procedure Notes (Signed)
Date/Time: 04/30/2020 8:20 AM Performed by: Allean Found, CRNA Airway Equipment and Method: Oral airway Placement Confirmation: positive ETCO2

## 2020-05-01 NOTE — Progress Notes (Signed)
Physical Therapy Treatment Patient Details Name: Gary Mueller MRN: [id number] DOB: 10/16/1941 Today's Date: 05/01/2020    History of Present Illness 79 y/o s/p R TKA 04/30/20.    PT Comments    Patient was able to perform stairs and household ambulation distance goal with supervision/ guarding this session. He demonstrates exceptional quadriceps strength and knee ROM at this time in his course of recovery. He reports minimal pain and denies need for pain medications when asked by RN. He is progressing quite well.     Follow Up Recommendations  Home health PT;Follow surgeon's recommendation for DC plan and follow-up therapies;Supervision - Intermittent     Equipment Recommendations  Rolling walker with 5" wheels;3in1 (PT)    Recommendations for Other Services       Precautions / Restrictions Precautions Precautions: Fall;Knee Precaution Booklet Issued: Yes (comment)(HEP) Restrictions Weight Bearing Restrictions: Yes RLE Weight Bearing: Weight bearing as tolerated    Mobility  Bed Mobility Overal bed mobility: Independent             General bed mobility comments: Patient is able to get to the EOB without assistance or discomfort.  Transfers Overall transfer level: Modified independent Equipment used: Rolling walker (2 wheeled)             General transfer comment: Patient is able to transfer from chair and from bed w/o need for PT assistance and very minimal cuing.  Ambulation/Gait Ambulation/Gait assistance: Supervision Gait Distance (Feet): 250 Feet Assistive device: Rolling walker (2 wheeled)     Gait velocity interpretation: 1.31 - 2.62 ft/sec, indicative of limited community ambulator General Gait Details: Patient demonstrates decreased stride length and externally rotated LEs. No buckling or fatigue noted/observed.   Stairs             Wheelchair Mobility    Modified Rankin (Stroke Patients Only)       Balance Overall balance  assessment: Modified Independent                                          Cognition Arousal/Alertness: Awake/alert Behavior During Therapy: WFL for tasks assessed/performed Overall Cognitive Status: Within Functional Limits for tasks assessed                                        Exercises Total Joint Exercises Ankle Circles/Pumps: AROM;10 reps Heel Slides: AROM;Strengthening;15 reps Straight Leg Raises: AROM;10 reps;15 reps Long Arc Quad: 15 reps;Both;Strengthening Knee Flexion: PROM;15 reps;Both Goniometric ROM: 5-105 Other Exercises Other Exercises: Patient was able to ascend/descend 4 steps with CGA x 1 and 1 hand rail for 2 separate bouts    General Comments General comments (skin integrity, edema, etc.): Drain/IV in place throughout session.      Pertinent Vitals/Pain Pain Assessment: No/denies pain    Home Living                      Prior Function            PT Goals (current goals can now be found in the care plan section) Acute Rehab PT Goals Patient Stated Goal: pt very motivated to do all he needs to maximize success PT Goal Formulation: With patient Time For Goal Achievement: 05/14/20 Potential to Achieve Goals: Good Progress towards PT goals: Progressing  toward goals    Frequency    BID      PT Plan Current plan remains appropriate    Co-evaluation              AM-PAC PT "6 Clicks" Mobility   Outcome Measure  Help needed turning from your back to your side while in a flat bed without using bedrails?: None Help needed moving from lying on your back to sitting on the side of a flat bed without using bedrails?: None Help needed moving to and from a bed to a chair (including a wheelchair)?: None Help needed standing up from a chair using your arms (e.g., wheelchair or bedside chair)?: None Help needed to walk in hospital room?: None Help needed climbing 3-5 steps with a railing? : None 6 Click  Score: 24    End of Session Equipment Utilized During Treatment: Gait belt Activity Tolerance: Patient tolerated treatment well Patient left: in chair;with call bell/phone within reach;with family/visitor present(notified nursing of malfunctioning alarm hook up) Nurse Communication: Mobility status PT Visit Diagnosis: Muscle weakness (generalized) (M62.81);Difficulty in walking, not elsewhere classified (R26.2);Pain Pain - Right/Left: Right Pain - part of body: Knee     Time: [id number] PT Time Calculation (min) (ACUTE ONLY): 33 min  Charges:  $Gait Training: 8-22 mins $Therapeutic Exercise: 8-22 mins            Royce Macadamia PT, DPT, CSCS             05/01/2020, 10:19 AM

## 2020-05-01 NOTE — Progress Notes (Signed)
Physical Therapy Treatment Patient Details Name: Gary Mueller MRN: [id number] DOB: Sep 05, 1941 Today's Date: 05/01/2020    History of Present Illness 79 y/o s/p R TKA 04/30/20.    PT Comments    Patient has continued to progress quite well, of note he did seem to have an audible "popping" sound mid-way through ambulation with his RLE. No pain, alteration in gait pattern observed. PT informed RN. In supine, patient was able to flex knee through full ROM with no auditory sounds. He was able to perform toilet transfer appropriately. He has met all acute rehab goals at this point.    Follow Up Recommendations  Home health PT;Follow surgeon's recommendation for DC plan and follow-up therapies;Supervision - Intermittent     Equipment Recommendations  Rolling walker with 5" wheels;3in1 (PT)    Recommendations for Other Services       Precautions / Restrictions Precautions Precautions: Fall;Knee Precaution Booklet Issued: Yes (comment)(HEP) Restrictions Weight Bearing Restrictions: Yes RLE Weight Bearing: Weight bearing as tolerated    Mobility  Bed Mobility Overal bed mobility: Independent             General bed mobility comments: Patient is able to get to the EOB without assistance or discomfort. Excellent speed demonstrated.  Transfers Overall transfer level: Modified independent Equipment used: Rolling walker (2 wheeled)             General transfer comment: Patient is able to transfer from chair and from bed w/o need for PT assistance and very minimal cuing.Excellent speed and balance demonstrated  Ambulation/Gait Ambulation/Gait assistance: Supervision Gait Distance (Feet): 250 Feet Assistive device: Rolling walker (2 wheeled) Gait Pattern/deviations: WFL(Within Functional Limits)   Gait velocity interpretation: <1.31 ft/sec, indicative of household ambulator General Gait Details: Cued patient to ambulate with step through gait pattern with notable  improvement in gait pattern. Of note an audiple "popping" sound noted in gait session, unclear if from patient, RW, or ancillary equipment.No alterations in gait pattern, no pain reported by patient. Informed RN.   Stairs             Wheelchair Mobility    Modified Rankin (Stroke Patients Only)       Balance Overall balance assessment: Modified Independent                                          Cognition Arousal/Alertness: Awake/alert Behavior During Therapy: WFL for tasks assessed/performed Overall Cognitive Status: Within Functional Limits for tasks assessed                                        Exercises Total Joint Exercises Ankle Circles/Pumps: AROM;20 reps Heel Slides: AROM;Strengthening;15 reps Straight Leg Raises: AROM;15 reps Long Arc Quad: 15 reps;Both;Strengthening Goniometric ROM: 5-105 in AM session Marching in Standing: Seated;15 reps;AROM Other Exercises Other Exercises: Assisted patient with toilet transfer with cuing for directions to elevated commode seat.    General Comments General comments (skin integrity, edema, etc.): Drain in place, no IV throughout session.      Pertinent Vitals/Pain Pain Assessment: No/denies pain(Reports very minimal discomfort to complete session.)    Home Living                      Prior Function  PT Goals (current goals can now be found in the care plan section) Acute Rehab PT Goals Patient Stated Goal: pt very motivated to do all he needs to maximize success PT Goal Formulation: With patient Time For Goal Achievement: 05/14/20 Potential to Achieve Goals: Good Progress towards PT goals: Progressing toward goals    Frequency    BID      PT Plan Current plan remains appropriate    Co-evaluation              AM-PAC PT "6 Clicks" Mobility   Outcome Measure  Help needed turning from your back to your side while in a flat bed without using  bedrails?: None Help needed moving from lying on your back to sitting on the side of a flat bed without using bedrails?: None Help needed moving to and from a bed to a chair (including a wheelchair)?: None Help needed standing up from a chair using your arms (e.g., wheelchair or bedside chair)?: None Help needed to walk in hospital room?: None Help needed climbing 3-5 steps with a railing? : A Little 6 Click Score: 23    End of Session Equipment Utilized During Treatment: Gait belt Activity Tolerance: Patient tolerated treatment well Patient left: in chair;with call bell/phone within reach(notified nursing of malfunctioning alarm hook up) Nurse Communication: Mobility status PT Visit Diagnosis: Muscle weakness (generalized) (M62.81);Difficulty in walking, not elsewhere classified (R26.2);Pain Pain - Right/Left: Right Pain - part of body: Knee     Time: 1422-1445 PT Time Calculation (min) (ACUTE ONLY): 23 min  Charges:  $Gait Training: 8-22 mins $Therapeutic Exercise: 8-22 mins            Royce Macadamia PT, DPT, CSCS             05/01/2020, 3:50 PM

## 2020-05-01 NOTE — Evaluation (Signed)
Occupational Therapy Evaluation Patient Details Name: Gary Mueller MRN: [id number] DOB: 1941-06-02 Today's Date: 05/01/2020    History of Present Illness 79 y/o s/p R TKA 04/30/20.   Clinical Impression   Gary Mueller was seen for OT evaluation this date, POD#1 from above surgery. Pt was independent in all I/ADLs prior to surgery including driving. Pt is eager to return to PLOF with less pain and improved safety and independence. Pt currently requires SBA + RW for toilet transfer c SUPERVISION for perihygiene at standard commode c riser. SBA + RW hand washing standing sink side. MOD I don/doff L sock at bed level. MIN A don/doff R sock at bed level - assist for threading over toes. Pt and caregiver instructed in polar care mgt, falls prevention strategies, home/routines modifications, DME/AE for LB bathing and dressing tasks, and compression stocking mgt. Pt presents to acute OT demonstrating impaired ADL performance and functional mobility 2/2 decreased LB access and decreased activity tolerance. Pt would benefit from skilled OT to address noted impairments and functional limitations (see below for any additional details) in order to maximize safety and independence while minimizing falls risk and caregiver burden. Upon hospital discharge, recommend HHOT to maximize pt safety and return to functional independence during meaningful occupations of daily life     Follow Up Recommendations  Home health OT;Supervision - Intermittent;Follow surgeon's recommendation for DC plan and follow-up therapies    Equipment Recommendations  3 in 1 bedside commode    Recommendations for Other Services       Precautions / Restrictions Precautions Precautions: Fall;Knee Precaution Booklet Issued: Yes (comment)(HEP) Restrictions Weight Bearing Restrictions: Yes RLE Weight Bearing: Weight bearing as tolerated      Mobility Bed Mobility Overal bed mobility: Independent             General bed mobility  comments: Sit>sup c flat bed   Transfers Overall transfer level: Modified independent Equipment used: Rolling walker (2 wheeled)             General transfer comment: MOD I + RW + MIN VCs for safe t/f technique sit<>stand at chair, commode, and bed.     Balance Overall balance assessment: Modified Independent                                         ADL either performed or assessed with clinical judgement   ADL Overall ADL's : Needs assistance/impaired                                       General ADL Comments: SBA + RW toilet t/f c SUPERVISION perihygiene. SBA + RW hand washing standing sink side. MOD I don/doff L sock at bed level. MIN A don/doff R sock at bed level - assist for threading over toes. INDEPENDENT self-feeding/srinking at bed level     Vision         Perception     Praxis      Pertinent Vitals/Pain Pain Assessment: No/denies pain     Hand Dominance     Extremity/Trunk Assessment Upper Extremity Assessment Upper Extremity Assessment: Overall WFL for tasks assessed   Lower Extremity Assessment Lower Extremity Assessment: Overall WFL for tasks assessed       Communication Communication Communication: No difficulties;HOH   Cognition Arousal/Alertness: Awake/alert Behavior During  Therapy: WFL for tasks assessed/performed Overall Cognitive Status: Within Functional Limits for tasks assessed                                     General Comments  Drain/IV in place throughout session.    Exercises Exercises: Other exercises Other Exercises Other Exercises: Pt and caregiver educated re: falls prevention, polar care mgmnt, home/routines modifications, adapted dressing techniques, pet management, safe RW technique, DME recs, and d/c recs Other Exercises: Toileting at standard commode, hand washing, sitting/standing balance/tolerance, sit<>stand, sit>sup, bed mobility, ~20 feet in room mobility    Shoulder Instructions      Home Living Family/patient expects to be discharged to:: Private residence Living Arrangements: Alone(wife passed 2 years ago) Available Help at Discharge: Family;Available 24 hours/day(children will be covering 24/7 initially, 3 live locally) Type of Home: House Home Access: Stairs to enter [employer] of Steps: 3 Entrance Stairs-Rails: Left Home Layout: Multi-level;Able to live on main level with bedroom/bathroom Alternate Level Stairs-Number of Steps: flight   Bathroom Shower/Tub: Occupational psychologist: Standard Bathroom Accessibility: Yes How Accessible: Accessible via walker Home Equipment: Walker - 4 wheels          Prior Functioning/Environment Level of Independence: Independent        Comments: Pt does not need AD, drives, manages the home, runs errands, etc        OT Problem List: Decreased strength;Decreased range of motion;Decreased knowledge of use of DME or AE      OT Treatment/Interventions: Self-care/ADL training;Therapeutic exercise;Neuromuscular education;Energy conservation;DME and/or AE instruction;Therapeutic activities;Patient/family education;Balance training    OT Goals(Current goals can be found in the care plan section) Acute Rehab OT Goals Patient Stated Goal: return to PLOF OT Goal Formulation: With patient/family Time For Goal Achievement: 05/15/20 Potential to Achieve Goals: Good ADL Goals Pt Will Perform Lower Body Dressing: with modified independence;sit to/from stand(c LRAD PRN) Pt Will Transfer to Toilet: with modified independence;ambulating;regular height toilet(c LRAD PRN) Pt Will Perform Toileting - Clothing Manipulation and hygiene: with modified independence;sit to/from stand(c LRAD PRN)  OT Frequency: Min 1X/week   Barriers to D/C:            Co-evaluation              AM-PAC OT "6 Clicks" Daily Activity     Outcome Measure Help from another person eating meals?:  None Help from another person taking care of personal grooming?: None Help from another person toileting, which includes using toliet, bedpan, or urinal?: A Little Help from another person bathing (including washing, rinsing, drying)?: A Little Help from another person to put on and taking off regular upper body clothing?: None Help from another person to put on and taking off regular lower body clothing?: A Little 6 Click Score: 21   End of Session Equipment Utilized During Treatment: Rolling walker  Activity Tolerance: Patient tolerated treatment well Patient left: in bed;with call bell/phone within reach;with family/visitor present;with SCD's reapplied  OT Visit Diagnosis: Other abnormalities of gait and mobility (R26.89)                Time: [id number] OT Time Calculation (min): 16 min Charges:  OT General Charges $OT Visit: 1 Visit OT Evaluation $OT Eval Low Complexity: 1 Low OT Treatments $Self Care/Home Management : 8-22 mins  Dessie Coma, M.S. OTR/L  05/01/20, 10:50 AM

## 2020-05-01 NOTE — Anesthesia Postprocedure Evaluation (Signed)
Anesthesia Post Note  Patient: Gary Mueller  Procedure(s) Performed: COMPUTER ASSISTED TOTAL KNEE ARTHROPLASTY (Right Knee)  Patient location during evaluation: Other (regular hospital floor) Anesthesia Type: Spinal Level of consciousness: oriented and awake and alert Pain management: pain level controlled Vital Signs Assessment: post-procedure vital signs reviewed and stable Respiratory status: spontaneous breathing, respiratory function stable and nonlabored ventilation Cardiovascular status: blood pressure returned to baseline and stable Postop Assessment: no headache, no backache and no apparent nausea or vomiting Anesthetic complications: no     Last Vitals:  Vitals:   05/01/20 0856 05/01/20 1014  BP: 124/62 (!) 158/64  Pulse: (!) 57 (!) 58  Resp: 19   Temp: 36.7 C   SpO2: 97%     Last Pain:  Vitals:   05/01/20 0856  TempSrc: Oral  PainSc:                  Arita Miss

## 2020-05-01 NOTE — Progress Notes (Signed)
   Subjective: 1 Day Post-Op Procedure(s) (LRB): COMPUTER ASSISTED TOTAL KNEE ARTHROPLASTY (Right) Patient reports pain as mild.   Patient is well, and has had no acute complaints or problems Denies any CP, SOB, ABD pain. + BM We will continue therapy today.  Plan is to go Home after hospital stay.  Objective: Vital signs in last 24 hours: Temp:  [97.1 F (36.2 C)-97.8 F (36.6 C)] 97.8 F (36.6 C) (05/08 0458) Pulse Rate:  [53-67] 65 (05/08 0458) Resp:  [11-17] 17 (05/08 0458) BP: (98-149)/(47-67) 134/57 (05/08 0458) SpO2:  [92 %-99 %] 96 % (05/08 0458)  Intake/Output from previous day: 05/07 0701 - 05/08 0700 In: 1996.1 [P.O.:240; I.V.:1256.1; IV Piggyback:500] Out: 1170 [Urine:800; Drains:320; Blood:50] Intake/Output this shift: No intake/output data recorded.  No results for input(s): HGB in the last 72 hours. No results for input(s): WBC, RBC, HCT, PLT in the last 72 hours. No results for input(s): NA, K, CL, CO2, BUN, CREATININE, GLUCOSE, CALCIUM in the last 72 hours. No results for input(s): LABPT, INR in the last 72 hours.  EXAM General - Patient is Alert, Appropriate and Oriented Extremity - Neurovascular intact Sensation intact distally Intact pulses distally Dorsiflexion/Plantar flexion intact No cellulitis present Compartment soft Dressing - dressing C/D/I, no drainage and Hemovac intact Motor Function - intact, moving foot and toes well on exam.   Past Medical History:  Diagnosis Date  . Allergy   . Anxiety   . Arthritis   . Cancer ([place]) 10/20/2016   Basal Cell Skin Cancer; lip, neck  . Cystoid macular edema of left eye 07/07/2019  . Depression   . Dysrhythmia    A FIB  . GERD (gastroesophageal reflux disease)   . HOH (hard of hearing)    Bilateral  . Hyperlipidemia   . Hypertension    Patient denies  . Hypothyroidism   . IBS (irritable bowel syndrome)   . Inguinal hernia    bilateral  . Pneumonia   . Thyroid disease   . Tinnitus of  both ears     Assessment/Plan:   1 Day Post-Op Procedure(s) (LRB): COMPUTER ASSISTED TOTAL KNEE ARTHROPLASTY (Right) Active Problems:   Total knee replacement status  Estimated body mass index is 23.32 kg/m as calculated from the following:   Height as of this encounter: 6' (1.829 m).   Weight as of this encounter: 78 kg. Advance diet Up with therapy, weightbearing as tolerated Vital signs are stable Pain well controlled.  Good progress with physical therapy yesterday. Patient has had bowel movement. Plan on discharge to home with home health PT tomorrow  DVT Prophylaxis - Lovenox, Foot Pumps and TED hose Weight-Bearing as tolerated to right leg   T. Rachelle Hora, PA-C [place] 05/01/2020, 7:51 AM

## 2020-05-02 MED ORDER — CELECOXIB 200 MG PO CAPS: 200.0000 mg | ORAL_CAPSULE | Freq: Two times a day (BID) | ORAL | 0 refills | Status: DC

## 2020-05-02 MED ORDER — TRAMADOL HCL 50 MG PO TABS: 50.0000 mg | ORAL_TABLET | ORAL | 0 refills | Status: DC | PRN

## 2020-05-02 MED ORDER — OXYCODONE HCL 5 MG PO TABS: 5.0000 mg | ORAL_TABLET | ORAL | 0 refills | Status: DC | PRN

## 2020-05-02 MED ORDER — ENOXAPARIN SODIUM 40 MG/0.4ML ~~LOC~~ SOLN: 40.0000 mg | SUBCUTANEOUS | 0 refills | Status: DC

## 2020-05-02 NOTE — TOC Transition Note (Addendum)
Transition of Care [facility]) - CM/SW Discharge Note   Patient Details  Name: Gary Mueller MRN: [id number] Date of Birth: 01-22-1941  Transition of Care [facility]) CM/SW Contact:  Boris Sharper, LCSW Phone Number: 05/02/2020, 9:14 AM   Clinical Narrative:    Pt medically stable for discharge per MD. CSW notified pt's daughter of discharge and she will be transporting him home. CSW contacted Helene Kelp with [facility] to notify her of pt's discharge. [facility] to follow for [facility] PT and OT. Pt's daughter notified CSW that pt will need RW and 3n1. CSW notified Brad with [facility] of DME needs, RW and 3n1 will be delivered to pt's room.   Final next level of care: [place] Barriers to Discharge: No Barriers Identified   Patient Goals and CMS Choice        Discharge Placement                Patient to be transferred to facility by: [name] Name of family member notified: [place] Patient and family notified of of transfer: 05/02/20  Discharge Plan and Services                DME Arranged: 3-N-1, Walker rolling DME Agency: [employer] Date DME Agency Contacted: 05/02/20 Time DME Agency Contacted: [phone] Representative spoke with at DME Agency: [place]: OT, PT [place] Agency: [facility] (now [facility]) Date [place]: 05/02/20 Time [place]: [phone] Representative spoke with at [place]: [place] ([place]) Interventions     Readmission Risk Interventions No flowsheet data found.

## 2020-05-02 NOTE — Progress Notes (Signed)
Patient discharging home, IV removed, instructions and prescriptions given to patient, verbalized understanding. Dressing changed on knee, dry and intact. Daughter to transport patient home.

## 2020-05-02 NOTE — Discharge Summary (Signed)
Physician Discharge Summary  Patient ID: Gary Mueller MRN: [id number] DOB/AGE: [birth year] 79 y.o.  Admit date: 04/30/2020 Discharge date: 05/02/2020  Admission Diagnoses:  Total knee replacement status [Z96.659]   Discharge Diagnoses: Patient Active Problem List   Diagnosis Date Noted  . Total knee replacement status 04/30/2020  . Primary osteoarthritis of right knee 03/28/2020  . CME (cystoid macular edema), left 07/07/2019  . Nonexudative age-related macular degeneration, bilateral, intermediate dry stage 07/07/2019  . SOB (shortness of breath) on exertion 08/22/2017  . Chest pain with high risk for cardiac etiology 08/22/2017  . Recurrent left inguinal hernia 01/04/2017  . Allergic rhinitis 06/17/2015  . CAD in native artery 06/17/2015  . A-fib ([place]) 06/17/2015  . Back ache 06/17/2015  . Gonalgia 06/17/2015  . Polypharmacy 06/17/2015  . Cold sore 06/17/2015  . Difficulty hearing 06/17/2015  . HLD (hyperlipidemia) 06/17/2015  . BP (high blood pressure) 06/17/2015  . Adult hypothyroidism 06/17/2015  . Meniere's disease 06/17/2015  . Buzzing in ear 06/17/2015  . Beat, premature ventricular 06/17/2015  . Status post total left knee replacement 04/13/2015    Past Medical History:  Diagnosis Date  . Allergy   . Anxiety   . Arthritis   . Cancer ([place]) 10/20/2016   Basal Cell Skin Cancer; lip, neck  . Cystoid macular edema of left eye 07/07/2019  . Depression   . Dysrhythmia    A FIB  . GERD (gastroesophageal reflux disease)   . HOH (hard of hearing)    Bilateral  . Hyperlipidemia   . Hypertension    Patient denies  . Hypothyroidism   . IBS (irritable bowel syndrome)   . Inguinal hernia    bilateral  . Pneumonia   . Thyroid disease   . Tinnitus of both ears      Transfusion: none   Consultants (if any):   Discharged Condition: Improved  Hospital Course: EFOSA SIRCY is an 79 y.o. male who was admitted 04/30/2020 with a diagnosis of right knee  osteoarthritis and went to the operating room on 04/30/2020 and underwent the above named procedures.    Surgeries: Procedure(s): COMPUTER ASSISTED TOTAL KNEE ARTHROPLASTY on 04/30/2020 Patient tolerated the surgery well. Taken to PACU where she was stabilized and then transferred to the orthopedic floor.  Started on Lovenox 30 mg q 12 hrs. Foot pumps applied bilaterally at 80 mm. Heels elevated on bed with rolled towels. No evidence of DVT. Negative [place]. Physical therapy started on day #1 for gait training and transfer. OT started day #1 for ADL and assisted devices.  Patient's foley was d/c on day #1. Patient's IV and hemovac was d/c on day #2.  On post op day #2 patient completed all goals of physical therapy, pain well controlled, vital signs stable, patient was stable and ready for discharge to home with HHPT.  Implants: DePuy PFC Sigma size 4 posterior stabilized femoral component (cemented), size 5 MBT tibial component (cemented), 41 mm 3 peg oval dome patella (cemented), and a 12.5 mm stabilized rotating platform polyethylene insert  He was given perioperative antibiotics:  Anti-infectives (From admission, onward)   Start     Dose/Rate Route Frequency Ordered Stop   04/30/20 1400  ceFAZolin (ANCEF) IVPB 2g/100 mL premix     2 g 200 mL/hr over 30 Minutes Intravenous Every 6 hours 04/30/20 1313 05/01/20 0834   04/30/20 0620  ceFAZolin (ANCEF) 2-4 GM/100ML-% IVPB    Note to Pharmacy: Myles Lipps   : cabinet override  04/30/20 0620 04/30/20 1829   04/30/20 0600  ceFAZolin (ANCEF) IVPB 2g/100 mL premix  Status:  Discontinued     2 g 200 mL/hr over 30 Minutes Intravenous On call to O.R. 04/29/20 2251 04/30/20 [id number]    .  He was given sequential compression devices, early ambulation, and Lovenox TEDs for DVT prophylaxis.  He benefited maximally from the hospital stay and there were no complications.    Recent vital signs:  Vitals:   05/01/20 2215 05/02/20 0748  BP: (!)  133/56 (!) 149/63  Pulse: (!) 59 (!) 56  Resp: 17   Temp: 98.3 F (36.8 C) 97.7 F (36.5 C)  SpO2: 97% 96%    Recent laboratory studies:  Lab Results  Component Value Date   HGB 13.4 04/22/2020   HGB 11.1 (L) 03/04/2020   HGB 14.0 01/28/2020   Lab Results  Component Value Date   WBC 6.8 04/22/2020   PLT 174 04/22/2020   Lab Results  Component Value Date   INR 1.1 04/22/2020   Lab Results  Component Value Date   NA 138 04/22/2020   K 4.1 04/22/2020   CL 106 04/22/2020   CO2 26 04/22/2020   BUN 26 (H) 04/22/2020   CREATININE 0.75 04/22/2020   GLUCOSE 93 04/22/2020    Discharge Medications:   Allergies as of 05/02/2020      Reactions   Codeine    GI intolerance; hallucinations    Statins    Muscle pain      Medication List    TAKE these medications   acetaminophen 500 MG tablet Commonly known as: TYLENOL Take 1,000 mg by mouth daily as needed for moderate pain.   amLODipine 5 MG tablet Commonly known as: NORVASC TAKE ONE TABLET EVERY DAY   ascorbic acid 1000 MG tablet Commonly known as: VITAMIN C Take 1 tablet by mouth daily.   celecoxib 200 MG capsule Commonly known as: CELEBREX Take 1 capsule (200 mg total) by mouth 2 (two) times daily.   enoxaparin 40 MG/0.4ML injection Commonly known as: Lovenox Inject 0.4 mLs (40 mg total) into the skin daily for 14 days.   levothyroxine 100 MCG tablet Commonly known as: SYNTHROID TAKE 1 TABLET EVERY DAY ON EMPTY STOMACHWITH A GLASS OF WATER AT LEAST 30-60 MINBEFORE BREAKFAST What changed: See the new instructions.   Lutein 10 MG Tabs Take 1 tablet by mouth daily.   multivitamin with minerals Tabs tablet Take 1 tablet by mouth every evening.   oxyCODONE 5 MG immediate release tablet Commonly known as: Oxy IR/ROXICODONE Take 1 tablet (5 mg total) by mouth every 4 (four) hours as needed for moderate pain (pain score 4-6).   pantoprazole 40 MG tablet Commonly known as: PROTONIX Take 1 tablet by mouth  daily.   rosuvastatin 5 MG tablet Commonly known as: Crestor Take 1 tablet (5 mg total) by mouth daily.   traMADol 50 MG tablet Commonly known as: ULTRAM Take 1-2 tablets (50-100 mg total) by mouth every 4 (four) hours as needed for moderate pain.   vitamin E 180 MG (400 UNITS) capsule Take 1 capsule by mouth daily.            Durable Medical Equipment  (From admission, onward)         Start     Ordered   04/30/20 1314  DME Walker rolling  Once    Question:  Patient needs a walker to treat with the following condition  Answer:  Total knee replacement  status   04/30/20 1313   04/30/20 1314  DME Bedside commode  Once    Question:  Patient needs a bedside commode to treat with the following condition  Answer:  Total knee replacement status   04/30/20 1313          Diagnostic Studies: DG Knee Right Port  Result Date: 04/30/2020 CLINICAL DATA:  Right knee arthroplasty. EXAM: PORTABLE RIGHT KNEE - 1-2 VIEW COMPARISON:  None. FINDINGS: Total knee arthroplasty components appear well seated. No complicating features are identified. IMPRESSION: Well seated components of a total knee arthroplasty. Electronically Signed   By: Marijo Sanes M.D.   On: 04/30/2020 12:13    Disposition:     Follow-up Information    [facility] On 05/14/2020.   Specialty: Orthopedic Surgery Why: at 1:15pm Contact information: [place] [address] [phone]        Dereck Leep, MD On 06/15/2020.   Specialty: Orthopedic Surgery Why: at 2:30pm Contact information: [place] [address] [phone]            Signed: Feliberto Gottron 05/02/2020, 8:59 AM

## 2020-05-02 NOTE — Progress Notes (Signed)
Physical Therapy Treatment Patient Details Name: Gary Mueller MRN: [id number] DOB: March 15, 1941 Today's Date: 05/02/2020    History of Present Illness 79 y/o s/p R TKA 04/30/20.    PT Comments    Minimal pain.  Completed exercises, lap around unit and stair training with ease.  Reviewed d/c instructions, safety and expectations.  No further questions or concerns.  No" popping" with gait as noted yesterday.  Follow Up Recommendations  Home health PT;Follow surgeon's recommendation for DC plan and follow-up therapies;Supervision - Intermittent     Equipment Recommendations  Rolling walker with 5" wheels;3in1 (PT)    Recommendations for Other Services       Precautions / Restrictions Precautions Precautions: Fall;Knee Precaution Booklet Issued: Yes (comment)(HEP) Restrictions Weight Bearing Restrictions: Yes RLE Weight Bearing: Weight bearing as tolerated    Mobility  Bed Mobility Overal bed mobility: Independent                Transfers Overall transfer level: Modified independent Equipment used: Rolling walker (2 wheeled)                Ambulation/Gait Ambulation/Gait assistance: Supervision Gait Distance (Feet): 250 Feet Assistive device: Rolling walker (2 wheeled) Gait Pattern/deviations: WFL(Within Functional Limits) Gait velocity: decreased   General Gait Details: no popping noted today   Stairs Stairs: Yes Stairs assistance: Min guard;Supervision Stair Management: One rail Left;Step to pattern;Alternating pattern Number of Stairs: 4 General stair comments: does well with either gait pattern.  Rail on left so he does better with good foot down first with no pain or buckling.   Wheelchair Mobility    Modified Rankin (Stroke Patients Only)       Balance Overall balance assessment: Modified Independent                                          Cognition Arousal/Alertness: Awake/alert Behavior During Therapy: WFL for  tasks assessed/performed Overall Cognitive Status: Within Functional Limits for tasks assessed                                        Exercises Total Joint Exercises Ankle Circles/Pumps: AROM;20 reps Long Arc Quad: 15 reps;Strengthening;Right Knee Flexion: PROM;15 reps;Right Goniometric ROM: 5-108 limited by rail under bed vs pain    General Comments        Pertinent Vitals/Pain Pain Assessment: No/denies pain    Home Living                      Prior Function            PT Goals (current goals can now be found in the care plan section) Progress towards PT goals: Progressing toward goals    Frequency    BID      PT Plan Current plan remains appropriate    Co-evaluation              AM-PAC PT "6 Clicks" Mobility   Outcome Measure  Help needed turning from your back to your side while in a flat bed without using bedrails?: None Help needed moving from lying on your back to sitting on the side of a flat bed without using bedrails?: None Help needed moving to and from a bed to a chair (including a wheelchair)?: None Help  needed standing up from a chair using your arms (e.g., wheelchair or bedside chair)?: None Help needed to walk in hospital room?: None Help needed climbing 3-5 steps with a railing? : A Little 6 Click Score: 23    End of Session Equipment Utilized During Treatment: Gait belt Activity Tolerance: Patient tolerated treatment well Patient left: in chair;with call bell/phone within reach Nurse Communication: Mobility status Pain - Right/Left: Right Pain - part of body: Knee     Time: 0820-0833 PT Time Calculation (min) (ACUTE ONLY): 13 min  Charges:  $Gait Training: 8-22 mins                     Chesley Noon, PTA 05/02/20, 9:09 AM

## 2020-05-02 NOTE — Plan of Care (Signed)
  Problem: Increased Nutrient Needs (NI-5.1) Goal: Food and/or nutrient delivery Description: Individualized approach for food/nutrient provision. Outcome: Adequate for Discharge   Problem: Education: Goal: Knowledge of the prescribed therapeutic regimen will improve Outcome: Adequate for Discharge Goal: Individualized Educational Video(s) Outcome: Adequate for Discharge   Problem: Activity: Goal: Ability to avoid complications of mobility impairment will improve Outcome: Adequate for Discharge Goal: Range of joint motion will improve Outcome: Adequate for Discharge   Problem: Clinical Measurements: Goal: Postoperative complications will be avoided or minimized Outcome: Adequate for Discharge   Problem: Pain Management: Goal: Pain level will decrease with appropriate interventions Outcome: Adequate for Discharge   Problem: Skin Integrity: Goal: Will show signs of wound healing Outcome: Adequate for Discharge

## 2020-05-02 NOTE — Progress Notes (Signed)
   Subjective: 2 Days Post-Op Procedure(s) (LRB): COMPUTER ASSISTED TOTAL KNEE ARTHROPLASTY (Right) Patient reports pain as mild.  Patient completed all tasks with PT this morning.  Pain well controlled. Patient is well, and has had no acute complaints or problems Denies any CP, SOB, ABD pain. We will continue therapy today.  Plan is to go Home after hospital stay.  Objective: Vital signs in last 24 hours: Temp:  [97.7 F (36.5 C)-98.3 F (36.8 C)] 97.7 F (36.5 C) (05/09 0748) Pulse Rate:  [56-59] 56 (05/09 0748) Resp:  [17-19] 17 (05/08 2215) BP: (124-158)/(56-64) 149/63 (05/09 0748) SpO2:  [96 %-97 %] 96 % (05/09 0748)  Intake/Output from previous day: 05/08 0701 - 05/09 0700 In: 937.8 [P.O.:240; I.V.:461.5; IV Piggyback:236.3] Out: 30 [Drains:30] Intake/Output this shift: No intake/output data recorded.  No results for input(s): HGB in the last 72 hours. No results for input(s): WBC, RBC, HCT, PLT in the last 72 hours. No results for input(s): NA, K, CL, CO2, BUN, CREATININE, GLUCOSE, CALCIUM in the last 72 hours. No results for input(s): LABPT, INR in the last 72 hours.  EXAM General - Patient is Alert, Appropriate and Oriented Extremity - Neurovascular intact Sensation intact distally Intact pulses distally Dorsiflexion/Plantar flexion intact No cellulitis present Compartment soft Dressing - dressing C/D/I, no drainage and Hemovac intact Motor Function - intact, moving foot and toes well on exam.   Past Medical History:  Diagnosis Date  . Allergy   . Anxiety   . Arthritis   . Cancer ([place]) 10/20/2016   Basal Cell Skin Cancer; lip, neck  . Cystoid macular edema of left eye 07/07/2019  . Depression   . Dysrhythmia    A FIB  . GERD (gastroesophageal reflux disease)   . HOH (hard of hearing)    Bilateral  . Hyperlipidemia   . Hypertension    Patient denies  . Hypothyroidism   . IBS (irritable bowel syndrome)   . Inguinal hernia    bilateral  .  Pneumonia   . Thyroid disease   . Tinnitus of both ears     Assessment/Plan:   2 Days Post-Op Procedure(s) (LRB): COMPUTER ASSISTED TOTAL KNEE ARTHROPLASTY (Right) Active Problems:   Total knee replacement status  Estimated body mass index is 23.32 kg/m as calculated from the following:   Height as of this encounter: 6' (1.829 m).   Weight as of this encounter: 78 kg. Advance diet Up with therapy, weightbearing as tolerated Vital signs are stable Pain well controlled.   Discharge home with home health PT today.  DVT Prophylaxis - Lovenox, Foot Pumps and TED hose Weight-Bearing as tolerated to right leg   T. Rachelle Hora, PA-C [place] 05/02/2020, 8:53 AM

## 2020-05-03 ENCOUNTER — Other Ambulatory Visit: Payer: Self-pay | Admitting: Family Medicine

## 2020-05-03 DIAGNOSIS — I10 Essential (primary) hypertension: Secondary | ICD-10-CM | POA: Diagnosis not present

## 2020-05-03 DIAGNOSIS — E039 Hypothyroidism, unspecified: Secondary | ICD-10-CM | POA: Diagnosis not present

## 2020-05-03 DIAGNOSIS — Z96653 Presence of artificial knee joint, bilateral: Secondary | ICD-10-CM | POA: Diagnosis not present

## 2020-05-03 DIAGNOSIS — H9193 Unspecified hearing loss, bilateral: Secondary | ICD-10-CM | POA: Diagnosis not present

## 2020-05-03 DIAGNOSIS — M19041 Primary osteoarthritis, right hand: Secondary | ICD-10-CM | POA: Diagnosis not present

## 2020-05-03 DIAGNOSIS — H353132 Nonexudative age-related macular degeneration, bilateral, intermediate dry stage: Secondary | ICD-10-CM | POA: Diagnosis not present

## 2020-05-03 DIAGNOSIS — E785 Hyperlipidemia, unspecified: Secondary | ICD-10-CM | POA: Diagnosis not present

## 2020-05-03 DIAGNOSIS — H8109 Meniere's disease, unspecified ear: Secondary | ICD-10-CM | POA: Diagnosis not present

## 2020-05-03 DIAGNOSIS — Z471 Aftercare following joint replacement surgery: Secondary | ICD-10-CM | POA: Diagnosis not present

## 2020-05-03 DIAGNOSIS — F419 Anxiety disorder, unspecified: Secondary | ICD-10-CM | POA: Diagnosis not present

## 2020-05-03 DIAGNOSIS — Z87891 Personal history of nicotine dependence: Secondary | ICD-10-CM | POA: Diagnosis not present

## 2020-05-03 DIAGNOSIS — K589 Irritable bowel syndrome without diarrhea: Secondary | ICD-10-CM | POA: Diagnosis not present

## 2020-05-03 DIAGNOSIS — I4891 Unspecified atrial fibrillation: Secondary | ICD-10-CM | POA: Diagnosis not present

## 2020-05-03 DIAGNOSIS — Z85828 Personal history of other malignant neoplasm of skin: Secondary | ICD-10-CM | POA: Diagnosis not present

## 2020-05-03 DIAGNOSIS — Z7901 Long term (current) use of anticoagulants: Secondary | ICD-10-CM | POA: Diagnosis not present

## 2020-05-03 DIAGNOSIS — I2510 Atherosclerotic heart disease of native coronary artery without angina pectoris: Secondary | ICD-10-CM | POA: Diagnosis not present

## 2020-05-05 DIAGNOSIS — I4891 Unspecified atrial fibrillation: Secondary | ICD-10-CM | POA: Diagnosis not present

## 2020-05-05 DIAGNOSIS — E039 Hypothyroidism, unspecified: Secondary | ICD-10-CM | POA: Diagnosis not present

## 2020-05-05 DIAGNOSIS — I2510 Atherosclerotic heart disease of native coronary artery without angina pectoris: Secondary | ICD-10-CM | POA: Diagnosis not present

## 2020-05-05 DIAGNOSIS — Z471 Aftercare following joint replacement surgery: Secondary | ICD-10-CM | POA: Diagnosis not present

## 2020-05-05 DIAGNOSIS — I10 Essential (primary) hypertension: Secondary | ICD-10-CM | POA: Diagnosis not present

## 2020-05-05 DIAGNOSIS — M19041 Primary osteoarthritis, right hand: Secondary | ICD-10-CM | POA: Diagnosis not present

## 2020-05-07 DIAGNOSIS — E039 Hypothyroidism, unspecified: Secondary | ICD-10-CM | POA: Diagnosis not present

## 2020-05-07 DIAGNOSIS — I2510 Atherosclerotic heart disease of native coronary artery without angina pectoris: Secondary | ICD-10-CM | POA: Diagnosis not present

## 2020-05-07 DIAGNOSIS — Z471 Aftercare following joint replacement surgery: Secondary | ICD-10-CM | POA: Diagnosis not present

## 2020-05-07 DIAGNOSIS — I4891 Unspecified atrial fibrillation: Secondary | ICD-10-CM | POA: Diagnosis not present

## 2020-05-07 DIAGNOSIS — M19041 Primary osteoarthritis, right hand: Secondary | ICD-10-CM | POA: Diagnosis not present

## 2020-05-07 DIAGNOSIS — I10 Essential (primary) hypertension: Secondary | ICD-10-CM | POA: Diagnosis not present

## 2020-05-09 DIAGNOSIS — I2510 Atherosclerotic heart disease of native coronary artery without angina pectoris: Secondary | ICD-10-CM | POA: Diagnosis not present

## 2020-05-09 DIAGNOSIS — I4891 Unspecified atrial fibrillation: Secondary | ICD-10-CM | POA: Diagnosis not present

## 2020-05-09 DIAGNOSIS — Z471 Aftercare following joint replacement surgery: Secondary | ICD-10-CM | POA: Diagnosis not present

## 2020-05-09 DIAGNOSIS — M19041 Primary osteoarthritis, right hand: Secondary | ICD-10-CM | POA: Diagnosis not present

## 2020-05-09 DIAGNOSIS — E039 Hypothyroidism, unspecified: Secondary | ICD-10-CM | POA: Diagnosis not present

## 2020-05-09 DIAGNOSIS — I10 Essential (primary) hypertension: Secondary | ICD-10-CM | POA: Diagnosis not present

## 2020-05-11 DIAGNOSIS — Z471 Aftercare following joint replacement surgery: Secondary | ICD-10-CM | POA: Diagnosis not present

## 2020-05-11 DIAGNOSIS — I2510 Atherosclerotic heart disease of native coronary artery without angina pectoris: Secondary | ICD-10-CM | POA: Diagnosis not present

## 2020-05-11 DIAGNOSIS — I10 Essential (primary) hypertension: Secondary | ICD-10-CM | POA: Diagnosis not present

## 2020-05-11 DIAGNOSIS — I4891 Unspecified atrial fibrillation: Secondary | ICD-10-CM | POA: Diagnosis not present

## 2020-05-11 DIAGNOSIS — E039 Hypothyroidism, unspecified: Secondary | ICD-10-CM | POA: Diagnosis not present

## 2020-05-11 DIAGNOSIS — M19041 Primary osteoarthritis, right hand: Secondary | ICD-10-CM | POA: Diagnosis not present

## 2020-05-13 DIAGNOSIS — I2510 Atherosclerotic heart disease of native coronary artery without angina pectoris: Secondary | ICD-10-CM | POA: Diagnosis not present

## 2020-05-13 DIAGNOSIS — I10 Essential (primary) hypertension: Secondary | ICD-10-CM | POA: Diagnosis not present

## 2020-05-13 DIAGNOSIS — E039 Hypothyroidism, unspecified: Secondary | ICD-10-CM | POA: Diagnosis not present

## 2020-05-13 DIAGNOSIS — M19041 Primary osteoarthritis, right hand: Secondary | ICD-10-CM | POA: Diagnosis not present

## 2020-05-13 DIAGNOSIS — Z471 Aftercare following joint replacement surgery: Secondary | ICD-10-CM | POA: Diagnosis not present

## 2020-05-13 DIAGNOSIS — I4891 Unspecified atrial fibrillation: Secondary | ICD-10-CM | POA: Diagnosis not present

## 2020-05-14 ENCOUNTER — Other Ambulatory Visit: Payer: Self-pay | Admitting: Orthopedic Surgery

## 2020-05-14 ENCOUNTER — Other Ambulatory Visit: Payer: Self-pay

## 2020-05-14 ENCOUNTER — Ambulatory Visit
Admission: RE | Admit: 2020-05-14 | Discharge: 2020-05-14 | Disposition: A | Discharge status: Home / Self Care | Payer: Medicare Other | Source: Ambulatory Visit | Attending: Orthopedic Surgery | Admitting: Orthopedic Surgery

## 2020-05-14 DIAGNOSIS — Z96651 Presence of right artificial knee joint: Secondary | ICD-10-CM | POA: Insufficient documentation

## 2020-05-14 DIAGNOSIS — M7989 Other specified soft tissue disorders: Secondary | ICD-10-CM | POA: Diagnosis not present

## 2020-05-14 DIAGNOSIS — T8484XA Pain due to internal orthopedic prosthetic devices, implants and grafts, initial encounter: Secondary | ICD-10-CM

## 2020-05-14 DIAGNOSIS — M79661 Pain in right lower leg: Secondary | ICD-10-CM | POA: Diagnosis not present

## 2020-05-17 DIAGNOSIS — R29898 Other symptoms and signs involving the musculoskeletal system: Secondary | ICD-10-CM | POA: Diagnosis not present

## 2020-05-17 DIAGNOSIS — M25661 Stiffness of right knee, not elsewhere classified: Secondary | ICD-10-CM | POA: Diagnosis not present

## 2020-05-17 DIAGNOSIS — M25561 Pain in right knee: Secondary | ICD-10-CM | POA: Diagnosis not present

## 2020-05-17 DIAGNOSIS — Z96651 Presence of right artificial knee joint: Secondary | ICD-10-CM | POA: Diagnosis not present

## 2020-05-18 ENCOUNTER — Other Ambulatory Visit: Payer: Self-pay | Admitting: Family Medicine

## 2020-05-20 DIAGNOSIS — M25561 Pain in right knee: Secondary | ICD-10-CM | POA: Diagnosis not present

## 2020-05-20 DIAGNOSIS — Z96651 Presence of right artificial knee joint: Secondary | ICD-10-CM | POA: Diagnosis not present

## 2020-05-22 ENCOUNTER — Other Ambulatory Visit: Payer: Self-pay | Admitting: Family Medicine

## 2020-05-22 NOTE — Telephone Encounter (Signed)
Requested medication (s) are due for refill today: yes  Requested medication (s) are on the active medication list: ---  Last refill:  04/30/20  Future visit scheduled: yes  Notes to clinic:  hx med and provider   Requested Prescriptions  Pending Prescriptions Disp Refills   pantoprazole (PROTONIX) 40 MG tablet [Pharmacy Med Name: PANTOPRAZOLE SODIUM 40 MG DR TAB] 30 tablet     Sig: TAKE ONE TABLET (40 MG) BY MOUTH EVERY DAY      Gastroenterology: Proton Pump Inhibitors Passed - 05/22/2020 12:39 PM      Passed - Valid encounter within last 12 months    Recent Outpatient Visits           2 months ago PUD (peptic ulcer disease)   [facility] Gary Mueller., MD   3 months ago Essential hypertension   [facility] Gary Mueller., MD   8 months ago Flu vaccine need   [facility] Gary Mueller., MD   11 months ago CAD in native artery   [facility] Gary Mueller., MD   1 year ago Subacute maxillary sinusitis   [facility] [place], Vickki Muff, [place]       Future Appointments             In 1 week Gary Mueller., MD [facility], [place]   In 1 month Gary Mueller., MD [facility], [place]

## 2020-05-25 DIAGNOSIS — M25561 Pain in right knee: Secondary | ICD-10-CM | POA: Diagnosis not present

## 2020-05-25 DIAGNOSIS — Z96651 Presence of right artificial knee joint: Secondary | ICD-10-CM | POA: Diagnosis not present

## 2020-05-25 NOTE — Telephone Encounter (Signed)
Please advise refill? Rx not filled by provider before.

## 2020-05-26 ENCOUNTER — Ambulatory Visit: Payer: Self-pay | Admitting: Family Medicine

## 2020-05-27 DIAGNOSIS — Z96651 Presence of right artificial knee joint: Secondary | ICD-10-CM | POA: Diagnosis not present

## 2020-05-27 DIAGNOSIS — M25561 Pain in right knee: Secondary | ICD-10-CM | POA: Diagnosis not present

## 2020-06-01 DIAGNOSIS — Z96651 Presence of right artificial knee joint: Secondary | ICD-10-CM | POA: Diagnosis not present

## 2020-06-01 DIAGNOSIS — M25561 Pain in right knee: Secondary | ICD-10-CM | POA: Diagnosis not present

## 2020-06-02 ENCOUNTER — Ambulatory Visit: Payer: Medicare Other | Admitting: Family Medicine

## 2020-06-02 DIAGNOSIS — Z471 Aftercare following joint replacement surgery: Secondary | ICD-10-CM | POA: Diagnosis not present

## 2020-06-03 DIAGNOSIS — Z96651 Presence of right artificial knee joint: Secondary | ICD-10-CM | POA: Diagnosis not present

## 2020-06-03 DIAGNOSIS — M25561 Pain in right knee: Secondary | ICD-10-CM | POA: Diagnosis not present

## 2020-06-23 ENCOUNTER — Ambulatory Visit: Payer: Self-pay | Admitting: Family Medicine

## 2020-06-24 DIAGNOSIS — Z471 Aftercare following joint replacement surgery: Secondary | ICD-10-CM | POA: Diagnosis not present

## 2020-06-29 ENCOUNTER — Ambulatory Visit (INDEPENDENT_AMBULATORY_CARE_PROVIDER_SITE_OTHER): Payer: Medicare Other

## 2020-06-29 ENCOUNTER — Other Ambulatory Visit: Payer: Self-pay

## 2020-06-29 VITALS — BP 132/60 | HR 65 | Temp 97.9°F | Ht 72.0 in | Wt 172.6 lb

## 2020-06-29 DIAGNOSIS — Z0000 Encounter for general adult medical examination without abnormal findings: Secondary | ICD-10-CM

## 2020-06-29 NOTE — Progress Notes (Signed)
Subjective:   Gary Mueller is a 79 y.o. male who presents for Medicare Annual/Subsequent preventive examination.  Review of Systems    N/A  Cardiac Risk Factors include: advanced age (>30men, >70 women);dyslipidemia;male gender;hypertension     Objective:    Today's Vitals   06/29/20 1436  BP: 132/60  Pulse: 65  Temp: 97.9 F (36.6 C)  TempSrc: Temporal  SpO2: 96%  Weight: 172 lb 9.6 oz (78.3 kg)  Height: 6' (1.829 m)  PainSc: 0-No pain   Body mass index is 23.41 kg/m.  Advanced Directives 06/29/2020 04/30/2020 04/22/2020 02/07/2019 10/10/2018 01/09/2018 01/02/2018  Does Patient Have a Medical Advance Directive? Yes Yes Yes Yes No - Yes  Type of Paramedic of [place];Living will Healthcare Power of [place];Living will [place];Living will - [place];Living will [place];Living will  Does patient want to make changes to medical advance directive? - No - Patient declined No - Patient declined - - - -  Copy of [place] in Chart? No - copy requested Yes - validated most recent copy scanned in chart (See row information) No - copy requested No - copy requested - No - copy requested No - copy requested  Would patient like information on creating a medical advance directive? - - - - No - Patient declined - -    Current Medications (verified) Outpatient Encounter Medications as of 06/29/2020  Medication Sig  . acetaminophen (TYLENOL) 500 MG tablet Take 1,000 mg by mouth daily as needed for moderate pain.  Marland Kitchen amLODipine (NORVASC) 5 MG tablet TAKE ONE TABLET EVERY DAY (Patient taking differently: Take 5 mg by mouth daily. )  . ascorbic acid (VITAMIN C) 1000 MG tablet Take 1 tablet by mouth daily.  Marland Kitchen levothyroxine (SYNTHROID) 100 MCG tablet TAKE 1 TABLET EVERY DAY ON EMPTY STOMACHWITH A GLASS OF WATER AT LEAST 30-60 MINBEFORE BREAKFAST  . Multiple Vitamin  (MULTIVITAMIN WITH MINERALS) TABS tablet Take 1 tablet by mouth every evening.   . pantoprazole (PROTONIX) 40 MG tablet TAKE ONE TABLET (40 MG) BY MOUTH EVERY DAY  . rosuvastatin (CRESTOR) 5 MG tablet TAKE ONE TABLET BY MOUTH EVERY DAY  . vitamin E 180 MG (400 UNITS) capsule Take 1 capsule by mouth daily.  . celecoxib (CELEBREX) 200 MG capsule Take 1 capsule (200 mg total) by mouth 2 (two) times daily.  Marland Kitchen enoxaparin (LOVENOX) 40 MG/0.4ML injection Inject 0.4 mLs (40 mg total) into the skin daily for 14 days.  . Lutein 10 MG TABS Take 1 tablet by mouth daily.  Marland Kitchen oxyCODONE (OXY IR/ROXICODONE) 5 MG immediate release tablet Take 1 tablet (5 mg total) by mouth every 4 (four) hours as needed for moderate pain (pain score 4-6).  Marland Kitchen traMADol (ULTRAM) 50 MG tablet Take 1-2 tablets (50-100 mg total) by mouth every 4 (four) hours as needed for moderate pain.   No facility-administered encounter medications on file as of 06/29/2020.    Allergies (verified) Codeine and Statins   History: Past Medical History:  Diagnosis Date  . Allergy   . Anxiety   . Arthritis   . Cancer ([place]) 10/20/2016   Basal Cell Skin Cancer; lip, neck  . Cystoid macular edema of left eye 07/07/2019  . Depression   . Dysrhythmia    A FIB  . GERD (gastroesophageal reflux disease)   . HOH (hard of hearing)    Bilateral  . Hyperlipidemia   .  Hypertension    Patient denies  . Hypothyroidism   . IBS (irritable bowel syndrome)   . Inguinal hernia    bilateral  . Pneumonia   . Thyroid disease   . Tinnitus of both ears    Past Surgical History:  Procedure Laterality Date  . ANTERIOR VITRECTOMY Left 11/28/2018   Procedure: ANTERIOR VITRECTOMY;  Surgeon: Marchia Meiers, MD;  Location: [facility] ORS;  Service: Ophthalmology;  Laterality: Left;  . CARDIAC CATHETERIZATION    . CATARACT EXTRACTION W/PHACO Right 10/10/2018   Procedure: CATARACT EXTRACTION PHACO AND INTRAOCULAR LENS PLACEMENT ([place]);  Surgeon: Marchia Meiers, MD;   Location: [facility] OR[facility];  Service: Ophthalmology;  Laterality: Right;  [place] 00:41 CDE 6.87 Fluid pack lot # [id number]  . CATARACT EXTRACTION W/PHACO Left 11/28/2018   Procedure: CATARACT EXTRACTION PHACO AND INTRAOCULAR LENS PLACEMENT ([facility])-LEFT;  Surgeon: Marchia Meiers, MD;  Location: [facility] ORS;  Service: Ophthalmology;  Laterality: Left;  [place] 00:47.7 CDE 8.92 Fluid Pack lot # [id number]  . CHOLECYSTECTOMY  1968  . CHONDROPLASTY Right 01/02/2018   Procedure: CHONDROPLASTY;  Surgeon: Dereck Leep, MD;  Location: [facility] ORS;  Service: Orthopedics;  Laterality: Right;  . COLONOSCOPY  2014  . EYE SURGERY    . HERNIA REPAIR Left 04/18/1996   inguinal hernia repair/ Dr Bary Castilla  . HERNIA REPAIR Right 02/26/1996   Dr Bary Castilla  . HERNIA REPAIR Left 08/07/2002   Dr Bary Castilla  . JOINT REPLACEMENT    . KNEE ARTHROPLASTY Right 04/30/2020   Procedure: COMPUTER ASSISTED TOTAL KNEE ARTHROPLASTY;  Surgeon: Dereck Leep, MD;  Location: [facility] OR[facility];  Service: Orthopedics;  Laterality: Right;  . KNEE ARTHROSCOPY Right 01/02/2018   Procedure: ARTHROSCOPY KNEE;  Surgeon: Dereck Leep, MD;  Location: [facility] OR[facility];  Service: Orthopedics;  Laterality: Right;  . KNEE ARTHROSCOPY Left 02/14/2012   partial menisectomy and chondroplasty  . KNEE ARTHROSCOPY WITH MEDIAL MENISECTOMY Right 01/02/2018   Procedure: KNEE ARTHROSCOPY WITH MEDIAL MENISECTOMY;  Surgeon: Dereck Leep, MD;  Location: [facility];  Service: Orthopedics;  Laterality: Right;  . TONSILLECTOMY     as a child  . TOTAL KNEE ARTHROPLASTY Left 03/29/2015   [facility] Dr. Marry Guan  . VASECTOMY     Family History  Problem Relation Age of Onset  . Heart disease Mother        A fib  . Healthy Sister    Social History   Socioeconomic History  . Marital status: Widowed    Spouse name: Lelon Frohlich  . Number of children: 3  . Years of education: Not on file  . Highest education level: Some college, no degree  Occupational History  . Occupation: retired  Tobacco Use  . Smoking  status: Former Smoker    Packs/day: 1.50    Types: Cigarettes    Quit date: 12/24/1964    Years since quitting: 55.5  . Smokeless tobacco: Never Used  Vaping Use  . Vaping Use: Never used  Substance and Sexual Activity  . Alcohol use: Yes    Comment: 0-1 beer or glass of wine monthly  . Drug use: No  . Sexual activity: Not on file  Other Topics Concern  . Not on file  Social History Narrative  . Not on file   Social Determinants of Health   Financial Resource Strain: Low Risk   . Difficulty of Paying Living Expenses: Not hard at all  Food Insecurity: No Food Insecurity  . Worried About Charity fundraiser in the Last Year: Never true  .  Ran Out of Food in the Last Year: Never true  Transportation Needs: No Transportation Needs  . Lack of Transportation (Medical): No  . Lack of Transportation (Non-Medical): No  Physical Activity: Inactive  . Days of Exercise per Week: 0 days  . Minutes of Exercise per Session: 0 min  Stress: No Stress Concern Present  . Feeling of Stress : Not at all  Social Connections: Moderately Isolated  . Frequency of Communication with Friends and Family: More than three times a week  . Frequency of Social Gatherings with Friends and Family: More than three times a week  . Attends Religious Services: Never  . Active Member of Clubs or Organizations: Yes  . Attends Archivist Meetings: More than 4 times per year  . Marital Status: Widowed    Tobacco Counseling Counseling given: Not Answered   Clinical Intake:  Pre-visit preparation completed: Yes  Pain : No/denies pain Pain Score: 0-No pain     Nutritional Status: BMI of 19-24  Normal Nutritional Risks: None Diabetes: No  How often do you need to have someone help you when you read instructions, pamphlets, or other written materials from your doctor or pharmacy?: 1 - Never  Diabetic? No  Interpreter Needed?: No  Information entered by :: [facility], LPN   Activities of  Daily Living In your present state of health, do you have any difficulty performing the following activities: 06/29/2020 04/30/2020  Hearing? Tempie Donning  Comment Wears bilateral hearing aids. -  Vision? Y N  Comment Due to sx from a torn cornea and a damaged retina. Wears eye glasses. -  Difficulty concentrating or making decisions? N N  Walking or climbing stairs? N Y  Dressing or bathing? N N  Doing errands, shopping? N N  Preparing Food and eating ? N -  Using the Toilet? N -  In the past six months, have you accidently leaked urine? N -  Do you have problems with loss of bowel control? N -  Managing your Medications? N -  Managing your Finances? N -  Housekeeping or managing your Housekeeping? N -  Some recent data might be hidden    Patient Care Team: Jerrol Banana., MD as PCP - General (Family Medicine) Bary Castilla, Forest Gleason, MD (General Surgery) Isaias Cowman, MD as Consulting Physician (Cardiology) Marry Guan, Laurice Record, MD (Orthopedic Surgery) Phil Dopp, MD (Ophthalmology) Mettu, Leandro Reasoner, MD as Referring Physician (Ophthalmology) Byrd Hesselbach, MD as Referring Physician (Ophthalmology)  Indicate any recent Medical Services you may have received from other than [facility] providers in the past year (date may be approximate).     Assessment:   This is a routine wellness examination for [place].  Hearing/Vision screen No exam data present  Dietary issues and exercise activities discussed: Current Exercise Habits: The patient does not participate in regular exercise at present (Plays golf and walks dog daily.), Exercise limited by: None identified  Goals    . Exercise 3x per week (30 min per time)     Recommend to exercise for 3 days a week for at least 30 minutes at a time.     . Increase water intake     Starting 12/12/16, I will increase my water intake to 5-6 glasses a day.      Depression Screen PHQ 2/9 Scores 06/29/2020 02/07/2019 01/09/2018  12/12/2016 06/17/2015  PHQ - 2 Score 0 1 0 1 0    Fall Risk Fall Risk  06/29/2020 03/04/2020 02/07/2019 01/09/2018  12/12/2016  Falls in the past year? 0 0 0 No Yes  Comment - - - - -  Number falls in past yr: 0 0 - - 1  Comment - - - - -  Injury with Fall? 0 0 - - No  Follow up - - - - Falls prevention discussed    Any stairs in or around the home? Yes  If so, are there any without handrails? No  Home free of loose throw rugs in walkways, pet beds, electrical cords, etc? Yes  Adequate lighting in your home to reduce risk of falls? Yes   ASSISTIVE DEVICES UTILIZED TO PREVENT FALLS:  Life alert? No  Use of a cane, walker or w/c? No  Grab bars in the bathroom? No  Shower chair or bench in shower? Yes  Elevated toilet seat or a handicapped toilet? No   TIMED UP AND GO:  Was the test performed? Yes .  Length of time to ambulate 10 feet: 9 sec.   Gait steady and fast without use of assistive device  Cognitive Function: Declined today.      6CIT Screen 06/16/2019 12/12/2016  What Year? 0 points 0 points  What month? 0 points 0 points  What time? 0 points 0 points  Count back from 20 0 points 0 points  Months in reverse 0 points 0 points  Repeat phrase 2 points 2 points  Total Score 2 2    Immunizations Immunization History  Administered Date(s) Administered  . Fluad Quad(high Dose 65+) 09/25/2019  . Influenza-Unspecified 10/05/2011, 09/25/2013, 10/25/2017, 09/24/2018  . PFIZER SARS-COV-2 Vaccination 12/30/2019, 01/20/2020  . Pneumococcal Conjugate-13 12/12/2016  . Pneumococcal Polysaccharide-23 08/10/1998, 02/07/2019  . Tdap 05/21/2012  . Zoster 12/12/2007  . Zoster Recombinat (Shingrix) 08/13/2018, 11/07/2018    TDAP status: Up to date Flu Vaccine status: Up to date Pneumococcal vaccine status: Up to date Covid-19 vaccine status: Completed vaccines  Qualifies for Shingles Vaccine? Yes   Zostavax completed No   Shingrix Completed?: Yes  Screening Tests Health  Maintenance  Topic Date Due  . INFLUENZA VACCINE  07/25/2020  . TETANUS/TDAP  05/21/2022  . COVID-19 Vaccine  Completed  . PNA vac Low Risk Adult  Completed    Health Maintenance  There are no preventive care reminders to display for this patient.  Colorectal cancer screening: No longer required.   Lung Cancer Screening: (Low Dose CT Chest recommended if Age 81-80 years, 30 pack-year currently smoking OR have quit w/in 15years.) does not qualify.    Additional Screening:  Vision Screening: Recommended annual ophthalmology exams for early detection of glaucoma and other disorders of the eye. Is the patient up to date with their annual eye exam?  Yes  Who is the provider or what is the name of the office in which the patient attends annual eye exams? Dr Jimmye Norman If pt is not established with a provider, would they like to be referred to a provider to establish care? No .   Dental Screening: Recommended annual dental exams for proper oral hygiene  Community Resource Referral / Chronic Care Management: CRR required this visit?  No   CCM required this visit?  No      Plan:     I have personally reviewed and noted the following in the patient's chart:   . Medical and social history . Use of alcohol, tobacco or illicit drugs  . Current medications and supplements . Functional ability and status . Nutritional status . Physical activity .  Advanced directives . List of other physicians . Hospitalizations, surgeries, and ER visits in previous 12 months . Vitals . Screenings to include cognitive, depression, and falls . Referrals and appointments  In addition, I have reviewed and discussed with patient certain preventive protocols, quality metrics, and best practice recommendations. A written personalized care plan for preventive services as well as general preventive health recommendations were provided to patient.     Emony Dormer [place], [place]   03/25/6605   Nurse Notes:  None.

## 2020-06-29 NOTE — Patient Instructions (Signed)
[name] , Thank you for taking time to come for your Medicare Wellness Visit. I appreciate your ongoing commitment to your health goals. Please review the following plan we discussed and let me know if I can assist you in the future.   Screening recommendations/referrals: Colonoscopy: No longer required.  Recommended yearly ophthalmology/optometry visit for glaucoma screening and checkup Recommended yearly dental visit for hygiene and checkup  Vaccinations: Influenza vaccine: Done 09/25/19 Pneumococcal vaccine: Completed series Tdap vaccine: Up to date, due 04/2022 Shingles vaccine: Completed series    Advanced directives: Please bring a copy of your POA (Power of Attorney) and/or Living Will to your next appointment.   Conditions/risks identified: Recommend to start exercising 3 days a week for 30 minutes at a time. Continue to increase water intake to 6-8 8 oz glasses a day.   Next appointment: 07/08/20 @ 1:00 PM with Dr Gary Mueller   Preventive Care 81 Years and Older, Male Preventive care refers to lifestyle choices and visits with your health care provider that can promote health and wellness. What does preventive care include?  A yearly physical exam. This is also called an annual well check.  Dental exams once or twice a year.  Routine eye exams. Ask your health care provider how often you should have your eyes checked.  Personal lifestyle choices, including:  Daily care of your teeth and gums.  Regular physical activity.  Eating a healthy diet.  Avoiding tobacco and drug use.  Limiting alcohol use.  Practicing safe sex.  Taking low doses of aspirin every day.  Taking vitamin and mineral supplements as recommended by your health care provider. What happens during an annual well check? The services and screenings done by your health care provider during your annual well check will depend on your age, overall health, lifestyle risk factors, and family history of  disease. Counseling  Your health care provider may ask you questions about your:  Alcohol use.  Tobacco use.  Drug use.  Emotional well-being.  Home and relationship well-being.  Sexual activity.  Eating habits.  History of falls.  Memory and ability to understand (cognition).  Work and work Statistician. Screening  You may have the following tests or measurements:  Height, weight, and BMI.  Blood pressure.  Lipid and cholesterol levels. These may be checked every 5 years, or more frequently if you are over 81 years old.  Skin check.  Lung cancer screening. You may have this screening every year starting at age 81 if you have a 30-pack-year history of smoking and currently smoke or have quit within the past 15 years.  Fecal occult blood test (FOBT) of the stool. You may have this test every year starting at age 81.  Flexible sigmoidoscopy or colonoscopy. You may have a sigmoidoscopy every 5 years or a colonoscopy every 10 years starting at age 81.  Prostate cancer screening. Recommendations will vary depending on your family history and other risks.  Hepatitis C blood test.  Hepatitis B blood test.  Sexually transmitted disease (STD) testing.  Diabetes screening. This is done by checking your blood sugar (glucose) after you have not eaten for a while (fasting). You may have this done every 1-3 years.  Abdominal aortic aneurysm (AAA) screening. You may need this if you are a current or former smoker.  Osteoporosis. You may be screened starting at age 81 if you are at high risk. Talk with your health care provider about your test results, treatment options, and if necessary, the  need for more tests. Vaccines  Your health care provider may recommend certain vaccines, such as:  Influenza vaccine. This is recommended every year.  Tetanus, diphtheria, and acellular pertussis (Tdap, Td) vaccine. You may need a Td booster every 10 years.  Zoster vaccine. You may  need this after age 81.  Pneumococcal 13-valent conjugate (PCV13) vaccine. One dose is recommended after age 81.  Pneumococcal polysaccharide (PPSV23) vaccine. One dose is recommended after age 81. Talk to your health care provider about which screenings and vaccines you need and how often you need them. This information is not intended to replace advice given to you by your health care provider. Make sure you discuss any questions you have with your health care provider. Document Released: 01/07/2016 Document Revised: 08/30/2016 Document Reviewed: 10/12/2015 Elsevier Interactive Patient Education  2017 [employer] Prevention in the Home Falls can cause injuries. They can happen to people of all ages. There are many things you can do to make your home safe and to help prevent falls. What can I do on the outside of my home?  Regularly fix the edges of walkways and driveways and fix any cracks.  Remove anything that might make you trip as you walk through a door, such as a raised step or threshold.  Trim any bushes or trees on the path to your home.  Use bright outdoor lighting.  Clear any walking paths of anything that might make someone trip, such as rocks or tools.  Regularly check to see if handrails are loose or broken. Make sure that both sides of any steps have handrails.  Any raised decks and porches should have guardrails on the edges.  Have any leaves, snow, or ice cleared regularly.  Use sand or salt on walking paths during winter.  Clean up any spills in your garage right away. This includes oil or grease spills. What can I do in the bathroom?  Use night lights.  Install grab bars by the toilet and in the tub and shower. Do not use towel bars as grab bars.  Use non-skid mats or decals in the tub or shower.  If you need to sit down in the shower, use a plastic, non-slip stool.  Keep the floor dry. Clean up any water that spills on the floor as soon as it  happens.  Remove soap buildup in the tub or shower regularly.  Attach bath mats securely with double-sided non-slip rug tape.  Do not have throw rugs and other things on the floor that can make you trip. What can I do in the bedroom?  Use night lights.  Make sure that you have a light by your bed that is easy to reach.  Do not use any sheets or blankets that are too big for your bed. They should not hang down onto the floor.  Have a firm chair that has side arms. You can use this for support while you get dressed.  Do not have throw rugs and other things on the floor that can make you trip. What can I do in the kitchen?  Clean up any spills right away.  Avoid walking on wet floors.  Keep items that you use a lot in easy-to-reach places.  If you need to reach something above you, use a strong step stool that has a grab bar.  Keep electrical cords out of the way.  Do not use floor polish or wax that makes floors slippery. If you must use wax,  use non-skid floor wax.  Do not have throw rugs and other things on the floor that can make you trip. What can I do with my stairs?  Do not leave any items on the stairs.  Make sure that there are handrails on both sides of the stairs and use them. Fix handrails that are broken or loose. Make sure that handrails are as long as the stairways.  Check any carpeting to make sure that it is firmly attached to the stairs. Fix any carpet that is loose or worn.  Avoid having throw rugs at the top or bottom of the stairs. If you do have throw rugs, attach them to the floor with carpet tape.  Make sure that you have a light switch at the top of the stairs and the bottom of the stairs. If you do not have them, ask someone to add them for you. What else can I do to help prevent falls?  Wear shoes that:  Do not have high heels.  Have rubber bottoms.  Are comfortable and fit you well.  Are closed at the toe. Do not wear sandals.  If you  use a stepladder:  Make sure that it is fully opened. Do not climb a closed stepladder.  Make sure that both sides of the stepladder are locked into place.  Ask someone to hold it for you, if possible.  Clearly mark and make sure that you can see:  Any grab bars or handrails.  First and last steps.  Where the edge of each step is.  Use tools that help you move around (mobility aids) if they are needed. These include:  Canes.  Walkers.  Scooters.  Crutches.  Turn on the lights when you go into a dark area. Replace any light bulbs as soon as they burn out.  Set up your furniture so you have a clear path. Avoid moving your furniture around.  If any of your floors are uneven, fix them.  If there are any pets around you, be aware of where they are.  Review your medicines with your doctor. Some medicines can make you feel dizzy. This can increase your chance of falling. Ask your doctor what other things that you can do to help prevent falls. This information is not intended to replace advice given to you by your health care provider. Make sure you discuss any questions you have with your health care provider. Document Released: 10/07/2009 Document Revised: 05/18/2016 Document Reviewed: 01/15/2015 Elsevier Interactive Patient Education  2017 [employer].

## 2020-07-05 ENCOUNTER — Telehealth: Payer: Self-pay

## 2020-07-05 NOTE — Telephone Encounter (Signed)
Copied from [place] [id number]. Topic: General - Call Back - No Documentation >> Jul 05, 2020  9:13 PM Doyce Loose D wrote: Reason for CRM: Pt is providing number for health care directive registered with the state of [place]/ # [id number]

## 2020-07-08 ENCOUNTER — Ambulatory Visit: Payer: Self-pay | Admitting: Family Medicine

## 2020-07-19 NOTE — Progress Notes (Signed)
Trena Platt Cummings,acting as a scribe for Wilhemena Durie, MD.,have documented all relevant documentation on the behalf of Wilhemena Durie, MD,as directed by  Wilhemena Durie, MD while in the presence of Wilhemena Durie, MD.  Established patient visit   Patient: Gary Mueller   DOB: 03/02/1941   79 y.o. Male  MRN: [id number] Visit Date: 07/22/2020  Today's healthcare provider: Wilhemena Durie, MD   Chief Complaint  Patient presents with   Atrial Fibrillation   Hyperlipidemia   Hypertension   Subjective    HPI  Patient comes in today for follow-up.  He is doing well after right total knee replacement and is having his glaucoma and eye problems still followed by [facility] ophthalmology.  Overall he is doing fairly well.  He is in joint retirement since 12/26/2019 Patient had AWV with [facility] 06/29/2020.  Hypertension, follow-up  BP Readings from Last 3 Encounters:  07/22/20 (!) 119/64  06/29/20 132/60  05/02/20 (!) 149/63   Wt Readings from Last 3 Encounters:  07/22/20 173 lb 12.8 oz (78.8 kg)  06/29/20 172 lb 9.6 oz (78.3 kg)  04/30/20 171 lb 15.3 oz (78 kg)     He was last seen for hypertension 4 months ago.  BP at that visit was 111/65. Management since that visit includes; On amlodipine. He reports excellent compliance with treatment. He is not having side effects.  He is exercising. He is not adherent to low salt diet.   Outside blood pressures are not being checked.  He does not smoke.  Use of agents associated with hypertension: none.   --------------------------------------------------------------------------------------------------- Lipid/Cholesterol, follow-up  Last Lipid Panel: Lab Results  Component Value Date   CHOL 193 01/28/2020   LDLCALC 134 (H) 01/28/2020   HDL 40 01/28/2020   TRIG 103 01/28/2020    He was last seen for this 4 months ago.  Management since that visit includes; On rosuvastatin. He reports excellent compliance with  treatment. He is not having side effects.  He is following a Regular diet. Current exercise: walking  Last metabolic panel Lab Results  Component Value Date   GLUCOSE 93 04/22/2020   NA 138 04/22/2020   K 4.1 04/22/2020   BUN 26 (H) 04/22/2020   CREATININE 0.75 04/22/2020   GFRNONAA >60 04/22/2020   GFRAA >60 04/22/2020   CALCIUM 9.4 04/22/2020   AST 25 04/22/2020   ALT 23 04/22/2020   The ASCVD Risk score (Goff DC [name], et al., 2013) failed to calculate for the following reasons:   The 2013 ASCVD risk score is only valid for ages 52 to 2  ---------------------------------------------------------------------------------------------------   PUD (peptic ulcer disease) From 03/04/2020-Try pantoprazole 40 mg daily. Refer back to [facility] GI.  Clinically this is PUD.  Will stop Voltaren gel although it is unlikely to be a problem.  He is on nothing else I can see that could be a GI irritant.  Refer back to GI for consideration of EGD.  Atrial fibrillation, unspecified type ([place]) From 03/04/2020-Consider stopping at baby aspirin also if the pain continues.   Social History   Tobacco Use   Smoking status: Former Smoker    Packs/day: 1.50    Types: Cigarettes    Quit date: 12/24/1964    Years since quitting: 55.6   Smokeless tobacco: Never Used  Vaping Use   Vaping Use: Never used  Substance Use Topics   Alcohol use: Yes    Comment: 0-1 beer or glass of wine monthly  Drug use: No       Medications: Outpatient Medications Prior to Visit  Medication Sig   acetaminophen (TYLENOL) 500 MG tablet Take 1,000 mg by mouth daily as needed for moderate pain.   amLODipine (NORVASC) 5 MG tablet TAKE ONE TABLET EVERY DAY (Patient taking differently: Take 5 mg by mouth daily. )   ascorbic acid (VITAMIN C) 1000 MG tablet Take 1 tablet by mouth daily.   levothyroxine (SYNTHROID) 100 MCG tablet TAKE 1 TABLET EVERY DAY ON EMPTY STOMACHWITH A GLASS OF WATER AT LEAST 30-60  MINBEFORE BREAKFAST   Lutein 10 MG TABS Take 1 tablet by mouth daily.   Multiple Vitamin (MULTIVITAMIN WITH MINERALS) TABS tablet Take 1 tablet by mouth every evening.    pantoprazole (PROTONIX) 40 MG tablet TAKE ONE TABLET (40 MG) BY MOUTH EVERY DAY   rosuvastatin (CRESTOR) 5 MG tablet TAKE ONE TABLET BY MOUTH EVERY DAY   vitamin E 180 MG (400 UNITS) capsule Take 1 capsule by mouth daily.   celecoxib (CELEBREX) 200 MG capsule Take 1 capsule (200 mg total) by mouth 2 (two) times daily. (Patient not taking: Reported on 07/22/2020)   enoxaparin (LOVENOX) 40 MG/0.4ML injection Inject 0.4 mLs (40 mg total) into the skin daily for 14 days.   oxyCODONE (OXY IR/ROXICODONE) 5 MG immediate release tablet Take 1 tablet (5 mg total) by mouth every 4 (four) hours as needed for moderate pain (pain score 4-6). (Patient not taking: Reported on 07/22/2020)   traMADol (ULTRAM) 50 MG tablet Take 1-2 tablets (50-100 mg total) by mouth every 4 (four) hours as needed for moderate pain. (Patient not taking: Reported on 07/22/2020)   No facility-administered medications prior to visit.    Review of Systems  Constitutional: Negative.   HENT: Negative.   Eyes: Positive for photophobia.  Respiratory: Negative.   Cardiovascular: Negative.   Gastrointestinal: Negative.   Endocrine: Negative.   Genitourinary: Negative.   Musculoskeletal: Positive for arthralgias and back pain.  Skin: Negative.   Allergic/Immunologic: Negative.   Neurological: Negative.   Hematological: Negative.   Psychiatric/Behavioral: Negative.        Objective    BP (!) 119/64 (BP Location: Left Arm, Patient Position: Sitting, Cuff Size: Normal)    Pulse 51    Temp 97.7 F (36.5 C) (Oral)    Ht 6' (1.829 m)    Wt 173 lb 12.8 oz (78.8 kg)    BMI 23.57 kg/m  BP Readings from Last 3 Encounters:  07/22/20 (!) 119/64  06/29/20 132/60  05/02/20 (!) 149/63   Wt Readings from Last 3 Encounters:  07/22/20 173 lb 12.8 oz (78.8 kg)   06/29/20 172 lb 9.6 oz (78.3 kg)  04/30/20 171 lb 15.3 oz (78 kg)      Physical Exam Vitals reviewed.  Constitutional:      Appearance: Normal appearance. He is well-developed.  HENT:     Head: Normocephalic and atraumatic.     Right Ear: External ear normal.     Left Ear: External ear normal.     Nose: Nose normal.  Eyes:     General: No scleral icterus.    Conjunctiva/sclera: Conjunctivae normal.  Neck:     Thyroid: No thyromegaly.     Vascular: No carotid bruit.  Cardiovascular:     Rate and Rhythm: Normal rate and regular rhythm.     Heart sounds: Normal heart sounds.  Pulmonary:     Effort: Pulmonary effort is normal.     Breath sounds: Normal  breath sounds.  Abdominal:     Palpations: Abdomen is soft.  Lymphadenopathy:     Cervical: No cervical adenopathy.  Skin:    General: Skin is warm and dry.  Neurological:     General: No focal deficit present.     Mental Status: He is alert and oriented to person, place, and time.  Psychiatric:        Mood and Affect: Mood normal.        Behavior: Behavior normal.        Thought Content: Thought content normal.        Judgment: Judgment normal.       No results found for any visits on 07/22/20.  Assessment & Plan     1. Essential hypertension Good control.  2. Adult hypothyroidism Follow-up TSH  3. Hyperlipidemia, unspecified hyperlipidemia type   4. Atrial fibrillation, unspecified type ([place])   5. PUD (peptic ulcer disease) Restart pantoprazole for 2 more months as he is having some recent epigastric distress.  I do think the patient has had peptic ulcer earlier this year.  Etc. GI referral if symptoms persist despite PPI.   Return in about 4 months (around 11/22/2020).         Aailyah Dunbar Cranford Mon, MD  [facility] [phone] (phone) [phone] (fax)  [employer]

## 2020-07-22 ENCOUNTER — Encounter: Payer: Self-pay | Admitting: Family Medicine

## 2020-07-22 ENCOUNTER — Ambulatory Visit (INDEPENDENT_AMBULATORY_CARE_PROVIDER_SITE_OTHER): Payer: Medicare Other | Admitting: Family Medicine

## 2020-07-22 ENCOUNTER — Other Ambulatory Visit: Payer: Self-pay

## 2020-07-22 VITALS — BP 119/64 | HR 51 | Temp 97.7°F | Ht 72.0 in | Wt 173.8 lb

## 2020-07-22 DIAGNOSIS — I4891 Unspecified atrial fibrillation: Secondary | ICD-10-CM | POA: Diagnosis not present

## 2020-07-22 DIAGNOSIS — E039 Hypothyroidism, unspecified: Secondary | ICD-10-CM | POA: Diagnosis not present

## 2020-07-22 DIAGNOSIS — K279 Peptic ulcer, site unspecified, unspecified as acute or chronic, without hemorrhage or perforation: Secondary | ICD-10-CM

## 2020-07-22 DIAGNOSIS — I10 Essential (primary) hypertension: Secondary | ICD-10-CM

## 2020-07-22 DIAGNOSIS — E785 Hyperlipidemia, unspecified: Secondary | ICD-10-CM

## 2020-07-22 DIAGNOSIS — I2510 Atherosclerotic heart disease of native coronary artery without angina pectoris: Secondary | ICD-10-CM

## 2020-07-22 NOTE — Patient Instructions (Signed)
RESTART PANTOPRAZOLE FOR 2 MONTHS!!!

## 2020-08-11 DIAGNOSIS — Z85828 Personal history of other malignant neoplasm of skin: Secondary | ICD-10-CM | POA: Diagnosis not present

## 2020-08-11 DIAGNOSIS — L570 Actinic keratosis: Secondary | ICD-10-CM | POA: Diagnosis not present

## 2020-08-11 DIAGNOSIS — B351 Tinea unguium: Secondary | ICD-10-CM | POA: Diagnosis not present

## 2020-08-12 DIAGNOSIS — M8949 Other hypertrophic osteoarthropathy, multiple sites: Secondary | ICD-10-CM | POA: Diagnosis not present

## 2020-08-12 DIAGNOSIS — M79641 Pain in right hand: Secondary | ICD-10-CM | POA: Diagnosis not present

## 2020-08-12 DIAGNOSIS — M1812 Unilateral primary osteoarthritis of first carpometacarpal joint, left hand: Secondary | ICD-10-CM | POA: Diagnosis not present

## 2020-08-12 DIAGNOSIS — M79642 Pain in left hand: Secondary | ICD-10-CM | POA: Diagnosis not present

## 2020-08-18 ENCOUNTER — Other Ambulatory Visit: Payer: Self-pay

## 2020-08-18 ENCOUNTER — Encounter: Payer: Self-pay | Admitting: Occupational Therapy

## 2020-08-18 ENCOUNTER — Ambulatory Visit: Payer: Medicare Other | Attending: Rheumatology | Admitting: Occupational Therapy

## 2020-08-18 DIAGNOSIS — M79641 Pain in right hand: Secondary | ICD-10-CM | POA: Diagnosis not present

## 2020-08-18 DIAGNOSIS — M25642 Stiffness of left hand, not elsewhere classified: Secondary | ICD-10-CM | POA: Insufficient documentation

## 2020-08-18 DIAGNOSIS — M79642 Pain in left hand: Secondary | ICD-10-CM | POA: Diagnosis not present

## 2020-08-18 DIAGNOSIS — M25641 Stiffness of right hand, not elsewhere classified: Secondary | ICD-10-CM | POA: Insufficient documentation

## 2020-08-18 DIAGNOSIS — M6281 Muscle weakness (generalized): Secondary | ICD-10-CM | POA: Diagnosis not present

## 2020-08-18 NOTE — Therapy (Signed)
[place] [facility] [address] [place], [address] Phone: [phone]   Fax:  [phone]  Occupational Therapy Evaluation  Patient Details  Name: Gary Mueller MRN: [id number] Date of Birth: [birth year] Referring Provider (OT): Dr Jefm Bryant   Encounter Date: 08/18/2020   OT End of Session - 08/18/20 1631    Visit Number 1    Number of Visits 4    Date for OT Re-Evaluation 09/15/20    OT Start Time 1510    OT Stop Time 1608    OT Time Calculation (min) 58 min    Activity Tolerance Patient tolerated treatment well    Behavior During Therapy [facility] for tasks assessed/performed           Past Medical History:  Diagnosis Date  . Allergy   . Anxiety   . Arthritis   . Cancer ([place]) 10/20/2016   Basal Cell Skin Cancer; lip, neck  . Cystoid macular edema of left eye 07/07/2019  . Depression   . Dysrhythmia    A FIB  . GERD (gastroesophageal reflux disease)   . HOH (hard of hearing)    Bilateral  . Hyperlipidemia   . Hypertension    Patient denies  . Hypothyroidism   . IBS (irritable bowel syndrome)   . Inguinal hernia    bilateral  . Pneumonia   . Thyroid disease   . Tinnitus of both ears     Past Surgical History:  Procedure Laterality Date  . ANTERIOR VITRECTOMY Left 11/28/2018   Procedure: ANTERIOR VITRECTOMY;  Surgeon: Marchia Meiers, MD;  Location: [facility] ORS;  Service: Ophthalmology;  Laterality: Left;  . CARDIAC CATHETERIZATION    . CATARACT EXTRACTION W/PHACO Right 10/10/2018   Procedure: CATARACT EXTRACTION PHACO AND INTRAOCULAR LENS PLACEMENT ([place]);  Surgeon: Marchia Meiers, MD;  Location: [facility] ORS;  Service: Ophthalmology;  Laterality: Right;  [place] 00:41 CDE 6.87 Fluid pack lot # [id number]  . CATARACT EXTRACTION W/PHACO Left 11/28/2018   Procedure: CATARACT EXTRACTION PHACO AND INTRAOCULAR LENS PLACEMENT (I[facility])-LEFT;  Surgeon: Marchia Meiers, MD;  Location: [facility] ORS;  Service: Ophthalmology;   Laterality: Left;  [place] 00:47.7 CDE 8.92 Fluid Pack lot # [id number]  . CHOLECYSTECTOMY  1968  . CHONDROPLASTY Right 01/02/2018   Procedure: CHONDROPLASTY;  Surgeon: Dereck Leep, MD;  Location: [facility] ORS;  Service: Orthopedics;  Laterality: Right;  . COLONOSCOPY  2014  . EYE SURGERY    . HERNIA REPAIR Left 04/18/1996   inguinal hernia repair/ Dr Bary Castilla  . HERNIA REPAIR Right 02/26/1996   Dr Bary Castilla  . HERNIA REPAIR Left 08/07/2002   Dr Bary Castilla  . JOINT REPLACEMENT    . KNEE ARTHROPLASTY Right 04/30/2020   Procedure: COMPUTER ASSISTED TOTAL KNEE ARTHROPLASTY;  Surgeon: Dereck Leep, MD;  Location: [facility] ORS;  Service: Orthopedics;  Laterality: Right;  . KNEE ARTHROSCOPY Right 01/02/2018   Procedure: ARTHROSCOPY KNEE;  Surgeon: Dereck Leep, MD;  Location: [facility] ORS;  Service: Orthopedics;  Laterality: Right;  . KNEE ARTHROSCOPY Left 02/14/2012   partial menisectomy and chondroplasty  . KNEE ARTHROSCOPY WITH MEDIAL MENISECTOMY Right 01/02/2018   Procedure: KNEE ARTHROSCOPY WITH MEDIAL MENISECTOMY;  Surgeon: Dereck Leep, MD;  Location: [facility] ORS;  Service: Orthopedics;  Laterality: Right;  . TONSILLECTOMY     as a child  . TOTAL KNEE ARTHROPLASTY Left 03/29/2015   [facility] Dr. Marry Guan  . VASECTOMY      There were no vitals filed for this visit.  Subjective Assessment - 08/18/20 1623    Subjective  My hands got worse again the last 6 months - but the R middle finger painfull longer than that - harder time picking up and holding like glass, pain with laudry, change bed , gripping objects    Pertinent History Pt has long standing history of OA - and was seen by this OT in 2017 for bilateral hand pain - done great in couple of session then in ROM , pain and strength - pt refer by Rheumathology again because of increase pain and decrease ROM /strength    Patient Stated Goals TO get the pain and range of motion better in my hands again - and little more strength hopely to hold and carry objects  better    Currently in Pain? Yes    Pain Score 6     Pain Location Hand    Pain Orientation Right;Left    Pain Descriptors / Indicators Aching;Shooting    Pain Type Chronic pain    Pain Onset More than a month ago    Pain Frequency Intermittent             [facility] OT Assessment - 08/18/20 0001      Assessment   Medical Diagnosis OA with bilateral hand pain     Referring Provider (OT) Dr Jefm Bryant    Onset Date/Surgical Date 01/26/20    Hand Dominance Right      Home  Environment   Lives With Alone      Prior Function   Vocation Retired    Leisure Do little yardwork, dog, read kindle mostly , golf       Strength   Right Hand Grip (lbs) 40    Right Hand Lateral Pinch 14 lbs    Right Hand 3 Point Pinch 11 lbs    Left Hand Grip (lbs) 45    Left Hand Lateral Pinch 12 lbs    Left Hand 3 Point Pinch 11 lbs      Right Hand AROM   R Thumb Opposition to Index --   base of 5th pain in thumb CMC    R Index  MCP 0-90 90 Degrees    R Index PIP 0-100 85 Degrees    R Long  MCP 0-90 90 Degrees    R Long PIP 0-100 65 Degrees    R Ring  MCP 0-90 85 Degrees    R Ring PIP 0-100 90 Degrees    R Little  MCP 0-90 90 Degrees    R Little PIP 0-100 75 Degrees      Left Hand AROM   L Thumb Opposition to Index --   Opposition to base of 5th pain at thumb CMC    L Index  MCP 0-90 80 Degrees    L Index PIP 0-100 70 Degrees    L Long  MCP 0-90 85 Degrees    L Long PIP 0-100 80 Degrees    L Ring  MCP 0-90 85 Degrees    L Ring PIP 0-100 85 Degrees    L Little  MCP 0-90 90 Degrees    L Little PIP 0-100 80 Degrees           done fluido with L hand - decrease stiffbess and increase ROM  Done contrast on R hand - had increase ROM    Moist heat - in water  Tendon glides - pain free and not force - composite fist to 3 cm foam block -  few days - if pain free can go to 2 cm foam block 10 reps  Opposition to all digits - 5 reps  Pain free  Joint protection principles ed on - hand out  provided  Compression sleeves provided to use night and daytime as much for L 2nd PIP and R 3rd digit PIP               OT Education - 08/18/20 1631    Education Details findings of eval and HEP    Person(s) Educated Patient    Methods Explanation;Demonstration;Tactile cues;Verbal cues;Handout    Comprehension Verbal cues required;Returned demonstration;Verbalized understanding            OT Short Term Goals - 08/18/20 1634      OT SHORT TERM GOAL #1   Title Pain on PRWHE  improve with 15 points     Baseline pain on PRWHE at eval 36/50    Time 3    Period Weeks    Status New    Target Date 09/09/20      OT SHORT TERM GOAL #2   Title Pt to be ind in HEP to increase AROM in bilateral hands digits and decrease pain    Baseline see flowsheet for AROM - MC's and PIP 's decrease  since compare to 2017    Time 3    Period Weeks    Status New    Target Date 09/08/20             OT Long Term Goals - 08/18/20 1637      OT LONG TERM GOAL #1   Title Pt verbalize 3 joint protection principles and AE to increase ease at home     Baseline forgot them - since seen in 2017    Time 4    Period Weeks    Status New    Target Date 09/15/20      OT LONG TERM GOAL #2   Title R grip strength improve with at least 5-10 lbs to  report increase functional use holding and picking up glass or other objects    Baseline grip strength  was R 57/ L 46 lbs  in 2017 - now 40 and prehension decrease too    Time 4    Period Weeks    Status New    Target Date 09/15/20                 Plan - 08/18/20 1632    Clinical Impression Statement Pt refer to OT for bilateral hand pain because of diagnosis of OA - pt with pain worse in PIP's than MC's - and L thumb CMC worse than R CMC - pt was seen in 2017 and responded great in OT - but show this date comapre to then with decrease AROM at [facility] and PIP's as well as grip and prehension in R hand - limiting his functional use of hands in ADL's  and IADL's - pt can benefit from OT services    OT Occupational Profile and History Problem Focused Assessment - Including review of records relating to presenting problem    Occupational performance deficits (Please refer to evaluation for details): ADL's;IADL's;Play;Leisure    Body Structure / Function / Physical Skills ADL;Flexibility;ROM;UE functional use;FMC;Pain;Strength;IADL    Rehab Potential Good    Clinical Decision Making Limited treatment options, no task modification necessary    Comorbidities Affecting Occupational Performance: May have comorbidities impacting occupational performance   OA  Modification or Assistance to Complete Evaluation  No modification of tasks or assist necessary to complete eval    OT Frequency 1x / week    OT Duration 4 weeks    OT Treatment/Interventions Self-care/ADL training;Therapeutic exercise;Paraffin;Fluidtherapy;Contrast Bath;Manual Therapy;Splinting;Patient/family education    Plan assess progress in HEP    Consulted and Agree with Plan of Care Patient           Patient will benefit from skilled therapeutic intervention in order to improve the following deficits and impairments:   Body Structure / Function / Physical Skills: ADL, Flexibility, ROM, UE functional use, FMC, Pain, Strength, IADL       Visit Diagnosis: Pain in left hand - Plan: Ot plan of care cert/re-cert  Pain in right hand - Plan: Ot plan of care cert/re-cert  Stiffness of left hand, not elsewhere classified - Plan: Ot plan of care cert/re-cert  Stiffness of right hand, not elsewhere classified - Plan: Ot plan of care cert/re-cert  Muscle weakness (generalized) - Plan: Ot plan of care cert/re-cert    Problem List Patient Active Problem List   Diagnosis Date Noted  . Total knee replacement status 04/30/2020  . Primary osteoarthritis of right knee 03/28/2020  . CME (cystoid macular edema), left 07/07/2019  . Nonexudative age-related macular degeneration,  bilateral, intermediate dry stage 07/07/2019  . SOB (shortness of breath) on exertion 08/22/2017  . Chest pain with high risk for cardiac etiology 08/22/2017  . Recurrent left inguinal hernia 01/04/2017  . Allergic rhinitis 06/17/2015  . CAD in native artery 06/17/2015  . A-fib ([place]) 06/17/2015  . Back ache 06/17/2015  . Gonalgia 06/17/2015  . Polypharmacy 06/17/2015  . Cold sore 06/17/2015  . Difficulty hearing 06/17/2015  . HLD (hyperlipidemia) 06/17/2015  . BP (high blood pressure) 06/17/2015  . Adult hypothyroidism 06/17/2015  . Meniere's disease 06/17/2015  . Buzzing in ear 06/17/2015  . Beat, premature ventricular 06/17/2015  . Status post total left knee replacement 04/13/2015    Rosalyn Gess OTR/l,CLT 08/18/2020, 4:41 PM  [place] [facility] [address], [place], [address] Phone: [phone]   Fax:  [phone]  Name: Gary Mueller MRN: [id number] Date of Birth: Mar 17, 1941

## 2020-08-18 NOTE — Patient Instructions (Signed)
Moist heat - in water  Tendon glides - pain free and not force - composite fist to 3 cm foam block - few days - if pain free can go to 2 cm foam block 10 reps  Opposition to all digits - 5 reps  Pain free  Joint protection principles ed on - hand out provided  Compression sleeves provided to use night and daytime as much for L 2nd PIP and R 3rd digit PIP

## 2020-08-19 DIAGNOSIS — M79671 Pain in right foot: Secondary | ICD-10-CM | POA: Diagnosis not present

## 2020-08-19 DIAGNOSIS — M19071 Primary osteoarthritis, right ankle and foot: Secondary | ICD-10-CM | POA: Diagnosis not present

## 2020-08-19 DIAGNOSIS — Z79899 Other long term (current) drug therapy: Secondary | ICD-10-CM | POA: Diagnosis not present

## 2020-08-19 DIAGNOSIS — B351 Tinea unguium: Secondary | ICD-10-CM | POA: Diagnosis not present

## 2020-08-26 ENCOUNTER — Ambulatory Visit: Payer: Medicare Other | Attending: Rheumatology | Admitting: Occupational Therapy

## 2020-08-26 ENCOUNTER — Other Ambulatory Visit: Payer: Self-pay

## 2020-08-26 DIAGNOSIS — M6281 Muscle weakness (generalized): Secondary | ICD-10-CM | POA: Diagnosis not present

## 2020-08-26 DIAGNOSIS — M25641 Stiffness of right hand, not elsewhere classified: Secondary | ICD-10-CM

## 2020-08-26 DIAGNOSIS — M25642 Stiffness of left hand, not elsewhere classified: Secondary | ICD-10-CM | POA: Insufficient documentation

## 2020-08-26 DIAGNOSIS — M79641 Pain in right hand: Secondary | ICD-10-CM | POA: Diagnosis not present

## 2020-08-26 DIAGNOSIS — M79642 Pain in left hand: Secondary | ICD-10-CM | POA: Diagnosis not present

## 2020-08-26 NOTE — Therapy (Signed)
[facility] [address], [place], [address] Phone: [phone]   Fax:  [phone]  Occupational Therapy Treatment  Patient Details  Name: Gary Mueller MRN: [id number] Date of Birth: 1941/10/30 Referring Provider (OT): Dr Jefm Bryant   Encounter Date: 08/26/2020   OT End of Session - 08/26/20 1139    Visit Number 2    Number of Visits 2    Date for OT Re-Evaluation 08/26/20    OT Start Time 1055    OT Stop Time 1128    OT Time Calculation (min) 33 min    Activity Tolerance Patient tolerated treatment well    Behavior During Therapy [facility] for tasks assessed/performed           Past Medical History:  Diagnosis Date  . Allergy   . Anxiety   . Arthritis   . Cancer ([place]) 10/20/2016   Basal Cell Skin Cancer; lip, neck  . Cystoid macular edema of left eye 07/07/2019  . Depression   . Dysrhythmia    A FIB  . GERD (gastroesophageal reflux disease)   . HOH (hard of hearing)    Bilateral  . Hyperlipidemia   . Hypertension    Patient denies  . Hypothyroidism   . IBS (irritable bowel syndrome)   . Inguinal hernia    bilateral  . Pneumonia   . Thyroid disease   . Tinnitus of both ears     Past Surgical History:  Procedure Laterality Date  . ANTERIOR VITRECTOMY Left 11/28/2018   Procedure: ANTERIOR VITRECTOMY;  Surgeon: Marchia Meiers, MD;  Location: [facility] ORS;  Service: Ophthalmology;  Laterality: Left;  . CARDIAC CATHETERIZATION    . CATARACT EXTRACTION W/PHACO Right 10/10/2018   Procedure: CATARACT EXTRACTION PHACO AND INTRAOCULAR LENS PLACEMENT ([place]);  Surgeon: Marchia Meiers, MD;  Location: [facility] ORS;  Service: Ophthalmology;  Laterality: Right;  [place] 00:41 CDE 6.87 Fluid pack lot # [id number]  . CATARACT EXTRACTION W/PHACO Left 11/28/2018   Procedure: CATARACT EXTRACTION PHACO AND INTRAOCULAR LENS PLACEMENT (I[facility])-LEFT;  Surgeon: Marchia Meiers, MD;  Location: [facility] ORS;  Service: Ophthalmology;   Laterality: Left;  [place] 00:47.7 CDE 8.92 Fluid Pack lot # [id number]  . CHOLECYSTECTOMY  1968  . CHONDROPLASTY Right 01/02/2018   Procedure: CHONDROPLASTY;  Surgeon: Dereck Leep, MD;  Location: [facility] ORS;  Service: Orthopedics;  Laterality: Right;  . COLONOSCOPY  2014  . EYE SURGERY    . HERNIA REPAIR Left 04/18/1996   inguinal hernia repair/ Dr Bary Castilla  . HERNIA REPAIR Right 02/26/1996   Dr Bary Castilla  . HERNIA REPAIR Left 08/07/2002   Dr Bary Castilla  . JOINT REPLACEMENT    . KNEE ARTHROPLASTY Right 04/30/2020   Procedure: COMPUTER ASSISTED TOTAL KNEE ARTHROPLASTY;  Surgeon: Dereck Leep, MD;  Location: [facility];  Service: Orthopedics;  Laterality: Right;  . KNEE ARTHROSCOPY Right 01/02/2018   Procedure: ARTHROSCOPY KNEE;  Surgeon: Dereck Leep, MD;  Location: [facility] OR[facility];  Service: Orthopedics;  Laterality: Right;  . KNEE ARTHROSCOPY Left 02/14/2012   partial menisectomy and chondroplasty  . KNEE ARTHROSCOPY WITH MEDIAL MENISECTOMY Right 01/02/2018   Procedure: KNEE ARTHROSCOPY WITH MEDIAL MENISECTOMY;  Surgeon: Dereck Leep, MD;  Location: [facility] OR[facility];  Service: Orthopedics;  Laterality: Right;  . TONSILLECTOMY     as a child  . TOTAL KNEE ARTHROPLASTY Left 03/29/2015   [facility] Dr. Marry Guan  . VASECTOMY      There were no vitals filed for this visit.  Subjective Assessment - 08/26/20 1134    Subjective  Doing much better - did the warm water 4 x day and the exercises - trying to hold my book and kindle with my palm - my L thumb and R middle finger still hurting - shot did not help and the splint not helping    Pertinent History Pt has long standing history of OA - and was seen by this OT in 2017 for bilateral hand pain - done great in couple of session then in ROM , pain and strength - pt refer by Rheumathology again because of increase pain and decrease ROM /strength    Patient Stated Goals TO get the pain and range of motion better in my hands again - and little more strength hopely to hold and  carry objects better    Currently in Pain? Yes    Pain Score 5     Pain Location --   L thumb CMC   Pain Orientation Left    Pain Descriptors / Indicators Aching;Shooting    Pain Type Chronic pain    Pain Onset More than a month ago    Pain Frequency Intermittent              [facility] OT Assessment - 08/26/20 0001      Strength   Right Hand Grip (lbs) 45    Right Hand Lateral Pinch 15 lbs    Right Hand 3 Point Pinch 13 lbs    Left Hand Grip (lbs) 46    Left Hand Lateral Pinch 12 lbs    Left Hand 3 Point Pinch 11 lbs      Right Hand AROM   R Thumb Opposition to Index --   base of 5th - pain at [facility]   R Index  MCP 0-90 90 Degrees    R Index PIP 0-100 90 Degrees    R Long  MCP 0-90 90 Degrees    R Long PIP 0-100 72 Degrees    R Ring  MCP 0-90 90 Degrees    R Ring PIP 0-100 98 Degrees    R Little  MCP 0-90 90 Degrees    R Little PIP 0-100 75 Degrees      Left Hand AROM   L Thumb Opposition to Index --   base of 5th - pain at [facility]   L Index  MCP 0-90 80 Degrees    L Index PIP 0-100 70 Degrees    L Long  MCP 0-90 90 Degrees    L Long PIP 0-100 90 Degrees    L Ring  MCP 0-90 90 Degrees    L Ring PIP 0-100 90 Degrees    L Little  MCP 0-90 90 Degrees    L Little PIP 0-100 85 Degrees          measurements taken - see flowsheet - progress in grip and prehension in R hand  Increase AROM in all digits - bilateral hands  Pain still at L CMC of thumb and R PIP            OT Treatments/Exercises (OP) - 08/26/20 0001      RUE Fluidotherapy   Number Minutes Fluidotherapy 8 Minutes    RUE Fluidotherapy Location Hand;Wrist    Comments stiffness and pain decrease      LUE Fluidotherapy   Number Minutes Fluidotherapy 8 Minutes    LUE Fluidotherapy Location Hand;Wrist    Comments pain and stiffness decrease  done fluido bilateral hands -  decrease stiffbess and increase ROM  Cont at home  Moist heat - in water  Tendon glides review - pain free and not force  - can do composite fist to palm now 10 reps  Opposition to all digits - 5 reps  Pain free  Joint protection principles ed on - hand out provided  [employer] sleeves provided to use night and daytime as much for L 2nd PIP and R 3rd digit PIP  And pt showed to order them on [employer] with [employer] neoprene splint to use during activities that cause pain to L thumb - pt report feeling better supported at thumb [facility] when using it in clinic         OT Education - 08/26/20 1139    Education Details cont with HEP/joint protection  and splint wearing    Person(s) Educated Patient    Methods Explanation;Demonstration;Tactile cues;Verbal cues;Handout    Comprehension Verbal cues required;Returned demonstration;Verbalized understanding            OT Short Term Goals - 08/18/20 1634      OT SHORT TERM GOAL #1   Title Pain on PRWHE  improve with 15 points     Baseline pain on PRWHE at eval 36/50    Time 3    Period Weeks    Status New    Target Date 09/09/20      OT SHORT TERM GOAL #2   Title Pt to be ind in HEP to increase AROM in bilateral hands digits and decrease pain    Baseline see flowsheet for AROM - MC's and PIP 's decrease  since compare to 2017    Time 3    Period Weeks    Status New    Target Date 09/08/20             OT Long Term Goals - 08/18/20 1637      OT LONG TERM GOAL #1   Title Pt verbalize 3 joint protection principles and AE to increase ease at home     Baseline forgot them - since seen in 2017    Time 4    Period Weeks    Status New    Target Date 09/15/20      OT LONG TERM GOAL #2   Title R grip strength improve with at least 5-10 lbs to  report increase functional use holding and picking up glass or other objects    Baseline grip strength  was R 57/ L 46 lbs  in 2017 - now 40 and prehension decrease too    Time 4    Period Weeks    Status New    Target Date 09/15/20                 Plan - 08/26/20 1140    Clinical Impression  Statement Pt was seen the last time 2017 and responded great to HEP -and this date done great since last weeks evaluation - show increase AROM in bilateral hands digits flexion , and grip and prehension strength on R hand - pt report still pain in L thumb CMC - fitted with CMC neoprene that pt report feels providing better support for thumb CMC - and then cont haveing pain at R 3rd digit- pt to order some compression from [employer] to wear - pt independent in HEP and joint protection - discharge at this time    OT Occupational Profile and History Problem Focused  Assessment - Including review of records relating to presenting problem    Occupational performance deficits (Please refer to evaluation for details): ADL's;IADL's;Play;Leisure    Body Structure / Function / Physical Skills ADL;Flexibility;ROM;UE functional use;FMC;Pain;Strength;IADL    Rehab Potential Good    Clinical Decision Making Limited treatment options, no task modification necessary    Comorbidities Affecting Occupational Performance: May have comorbidities impacting occupational performance    Modification or Assistance to Complete Evaluation  No modification of tasks or assist necessary to complete eval    OT Frequency 1x / week    OT Duration 4 weeks    OT Treatment/Interventions Self-care/ADL training;Therapeutic exercise;Paraffin;Fluidtherapy;Contrast Bath;Manual Therapy;Splinting;Patient/family education    Plan assess progress in HEP    Consulted and Agree with Plan of Care Patient           Patient will benefit from skilled therapeutic intervention in order to improve the following deficits and impairments:   Body Structure / Function / Physical Skills: ADL, Flexibility, ROM, UE functional use, FMC, Pain, Strength, IADL       Visit Diagnosis: Pain in left hand  Pain in right hand  Stiffness of left hand, not elsewhere classified  Stiffness of right hand, not elsewhere classified  Muscle weakness  (generalized)    Problem List Patient Active Problem List   Diagnosis Date Noted  . Total knee replacement status 04/30/2020  . Primary osteoarthritis of right knee 03/28/2020  . CME (cystoid macular edema), left 07/07/2019  . Nonexudative age-related macular degeneration, bilateral, intermediate dry stage 07/07/2019  . SOB (shortness of breath) on exertion 08/22/2017  . Chest pain with high risk for cardiac etiology 08/22/2017  . Recurrent left inguinal hernia 01/04/2017  . Allergic rhinitis 06/17/2015  . CAD in native artery 06/17/2015  . A-fib ([place]) 06/17/2015  . Back ache 06/17/2015  . Gonalgia 06/17/2015  . Polypharmacy 06/17/2015  . Cold sore 06/17/2015  . Difficulty hearing 06/17/2015  . HLD (hyperlipidemia) 06/17/2015  . BP (high blood pressure) 06/17/2015  . Adult hypothyroidism 06/17/2015  . Meniere's disease 06/17/2015  . Buzzing in ear 06/17/2015  . Beat, premature ventricular 06/17/2015  . Status post total left knee replacement 04/13/2015    Rosalyn Gess OTR/l,CLT 08/26/2020, 11:45 AM  [facility] [address] [place], [address] Phone: [phone]   Fax:  [phone]  Name: CHRISTIN MCCREEDY MRN: [id number] Date of Birth: 10-11-1941

## 2020-09-02 DIAGNOSIS — I2510 Atherosclerotic heart disease of native coronary artery without angina pectoris: Secondary | ICD-10-CM | POA: Diagnosis not present

## 2020-09-02 DIAGNOSIS — E785 Hyperlipidemia, unspecified: Secondary | ICD-10-CM | POA: Diagnosis not present

## 2020-09-02 DIAGNOSIS — I493 Ventricular premature depolarization: Secondary | ICD-10-CM | POA: Diagnosis not present

## 2020-09-02 DIAGNOSIS — Z23 Encounter for immunization: Secondary | ICD-10-CM | POA: Diagnosis not present

## 2020-09-06 DIAGNOSIS — H353132 Nonexudative age-related macular degeneration, bilateral, intermediate dry stage: Secondary | ICD-10-CM | POA: Diagnosis not present

## 2020-09-06 DIAGNOSIS — H35352 Cystoid macular degeneration, left eye: Secondary | ICD-10-CM | POA: Diagnosis not present

## 2020-09-07 DIAGNOSIS — Z23 Encounter for immunization: Secondary | ICD-10-CM | POA: Diagnosis not present

## 2020-09-20 DIAGNOSIS — Z23 Encounter for immunization: Secondary | ICD-10-CM | POA: Diagnosis not present

## 2020-09-29 DIAGNOSIS — D3701 Neoplasm of uncertain behavior of lip: Secondary | ICD-10-CM | POA: Diagnosis not present

## 2020-09-29 DIAGNOSIS — L570 Actinic keratosis: Secondary | ICD-10-CM | POA: Diagnosis not present

## 2020-09-29 DIAGNOSIS — D040 Carcinoma in situ of skin of lip: Secondary | ICD-10-CM | POA: Diagnosis not present

## 2020-11-03 DIAGNOSIS — Z961 Presence of intraocular lens: Secondary | ICD-10-CM | POA: Diagnosis not present

## 2020-11-03 DIAGNOSIS — H02889 Meibomian gland dysfunction of unspecified eye, unspecified eyelid: Secondary | ICD-10-CM | POA: Diagnosis not present

## 2020-11-15 DIAGNOSIS — D040 Carcinoma in situ of skin of lip: Secondary | ICD-10-CM | POA: Diagnosis not present

## 2020-11-15 DIAGNOSIS — L988 Other specified disorders of the skin and subcutaneous tissue: Secondary | ICD-10-CM | POA: Diagnosis not present

## 2020-11-15 DIAGNOSIS — L814 Other melanin hyperpigmentation: Secondary | ICD-10-CM | POA: Diagnosis not present

## 2020-11-15 DIAGNOSIS — L578 Other skin changes due to chronic exposure to nonionizing radiation: Secondary | ICD-10-CM | POA: Diagnosis not present

## 2020-11-23 ENCOUNTER — Ambulatory Visit: Payer: Self-pay | Admitting: Family Medicine

## 2020-11-23 ENCOUNTER — Other Ambulatory Visit: Payer: Self-pay | Admitting: Family Medicine

## 2020-11-25 DIAGNOSIS — L814 Other melanin hyperpigmentation: Secondary | ICD-10-CM | POA: Diagnosis not present

## 2020-11-25 DIAGNOSIS — L821 Other seborrheic keratosis: Secondary | ICD-10-CM | POA: Diagnosis not present

## 2020-11-25 DIAGNOSIS — L570 Actinic keratosis: Secondary | ICD-10-CM | POA: Diagnosis not present

## 2020-11-25 DIAGNOSIS — B351 Tinea unguium: Secondary | ICD-10-CM | POA: Diagnosis not present

## 2020-11-25 DIAGNOSIS — D229 Melanocytic nevi, unspecified: Secondary | ICD-10-CM | POA: Diagnosis not present

## 2020-11-25 DIAGNOSIS — Z85828 Personal history of other malignant neoplasm of skin: Secondary | ICD-10-CM | POA: Diagnosis not present

## 2020-12-28 IMAGING — US US EXTREM LOW VENOUS*R*
2 series · 14 of 24 positions shown · non-contrast
Comparison: None.

CLINICAL DATA: 78-year-old male with right lower extremity pain and
swelling following total knee replacement

EXAM:
RIGHT LOWER EXTREMITY VENOUS DOPPLER ULTRASOUND
TECHNIQUE: Gray-scale sonography with compression, as well as color and duplex
ultrasound, were performed to evaluate the deep venous system(s)
from the level of the common femoral vein through the popliteal and
proximal calf veins.

[Series 1: us venous img lower uni right (dvt) · portal-venous · 12 of 34 slices shown]
[im 1/34]
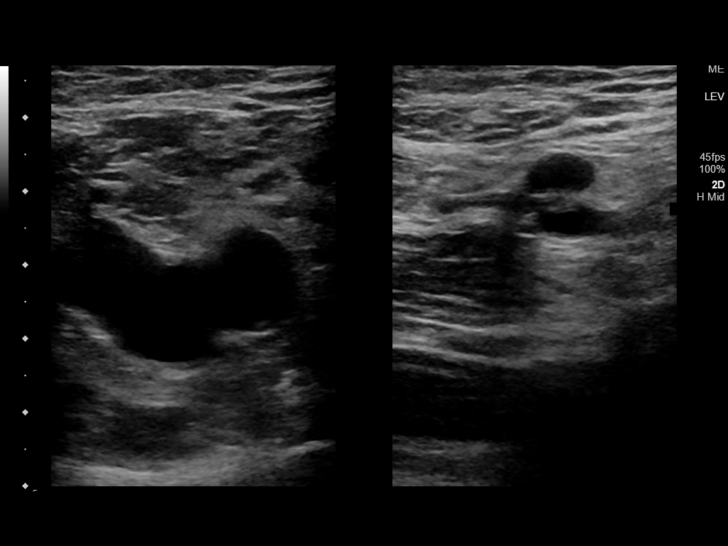
[im 4/34]
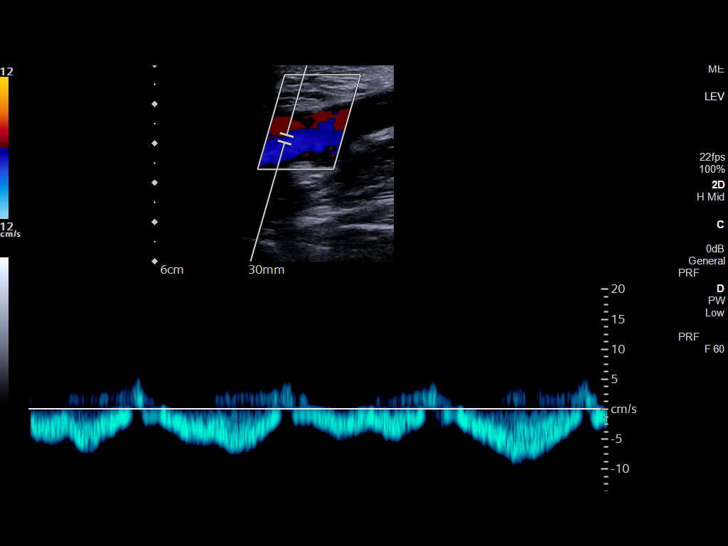
[im 7/34]
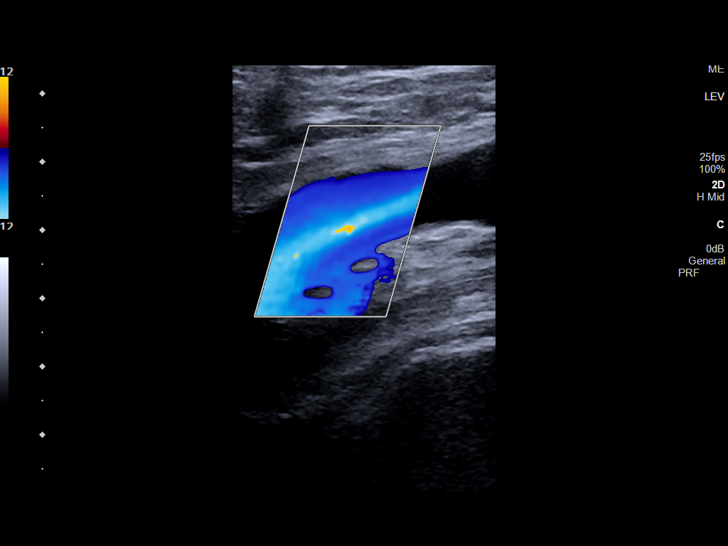
[im 10/34]
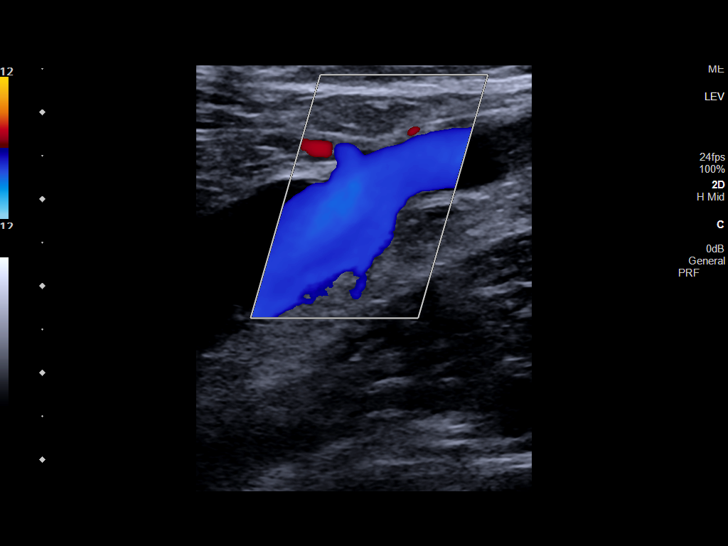
[im 12/34]
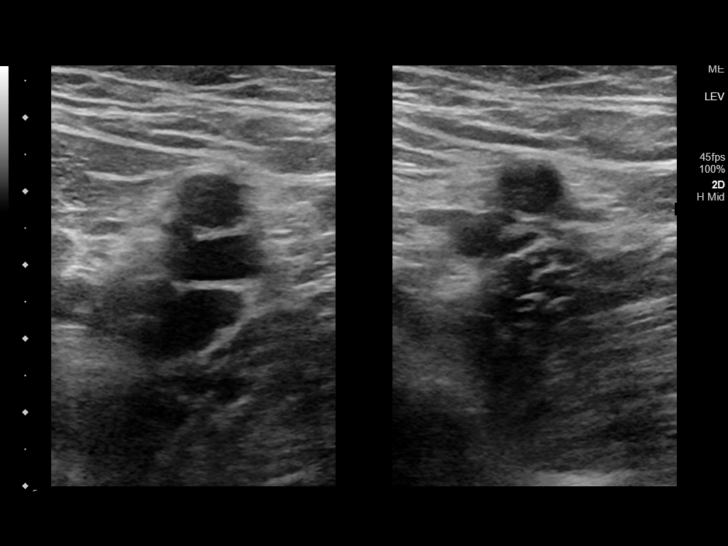
[im 15/34]
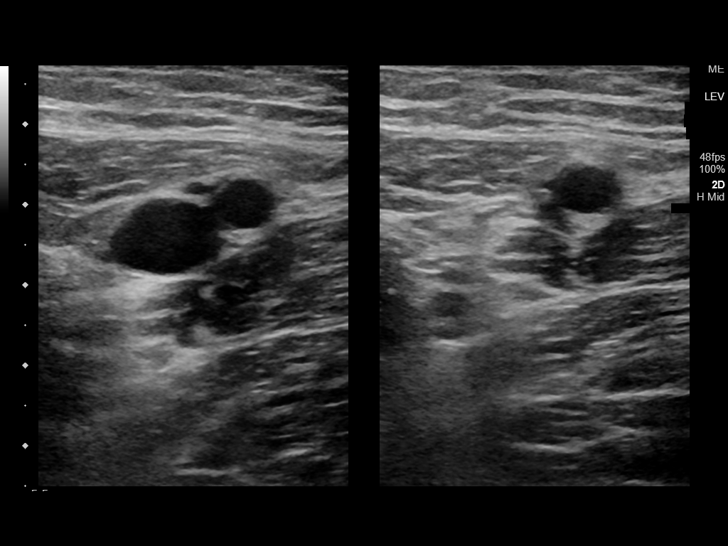
[im 19/34]
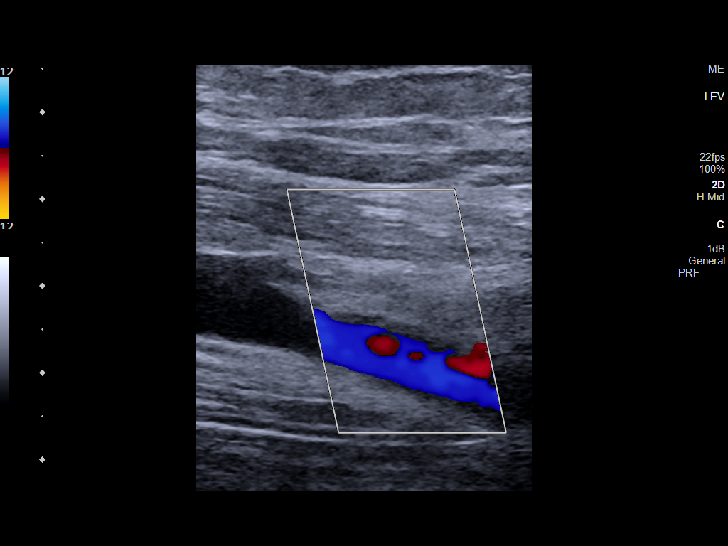
[im 20/34]
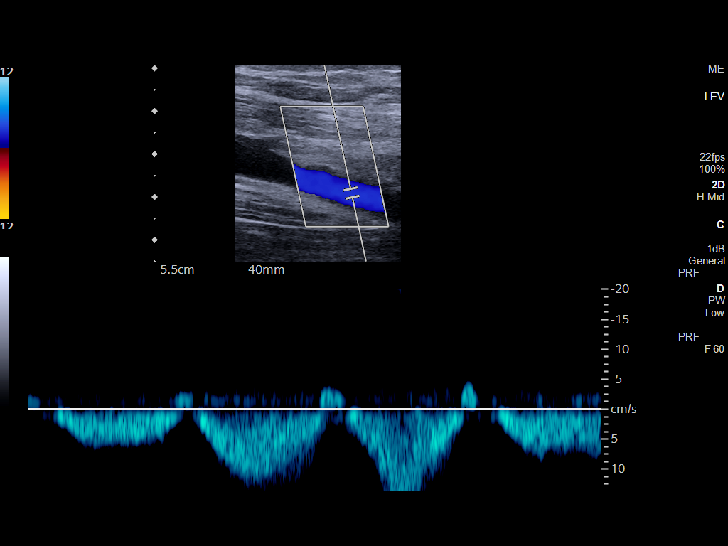
[im 24/34]
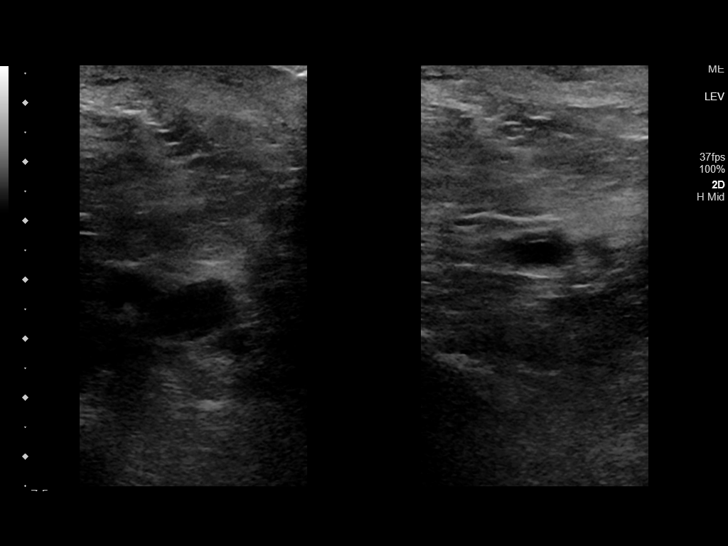
[im 27/34]
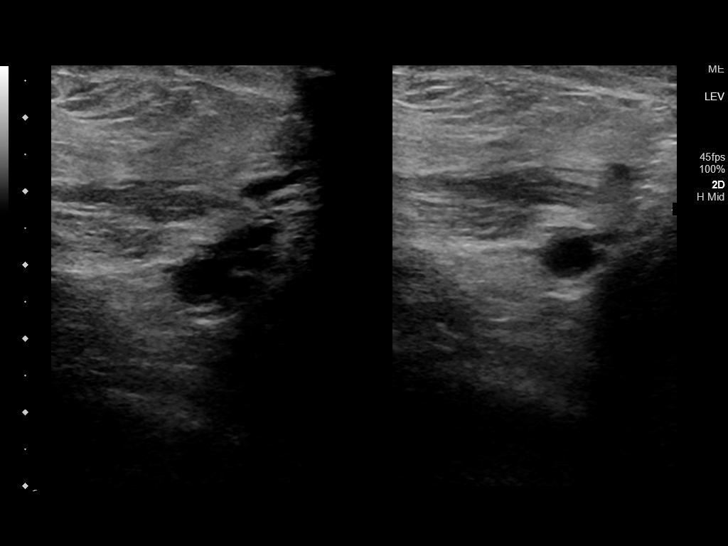
[im 30/34]
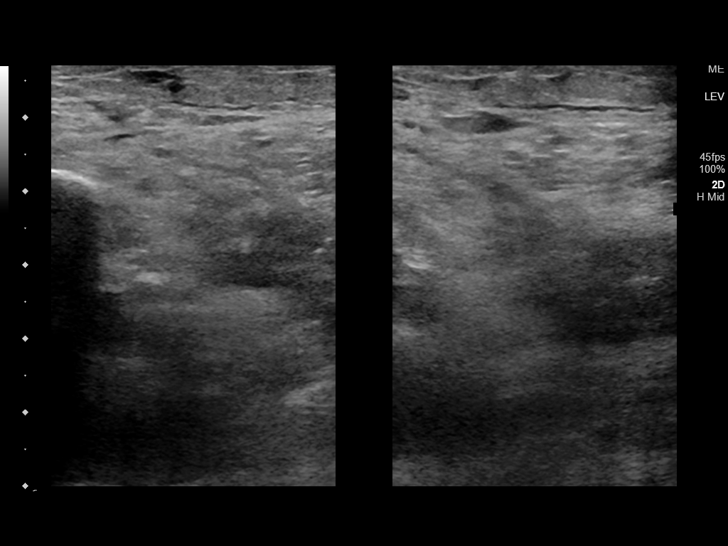
[im 32/34]
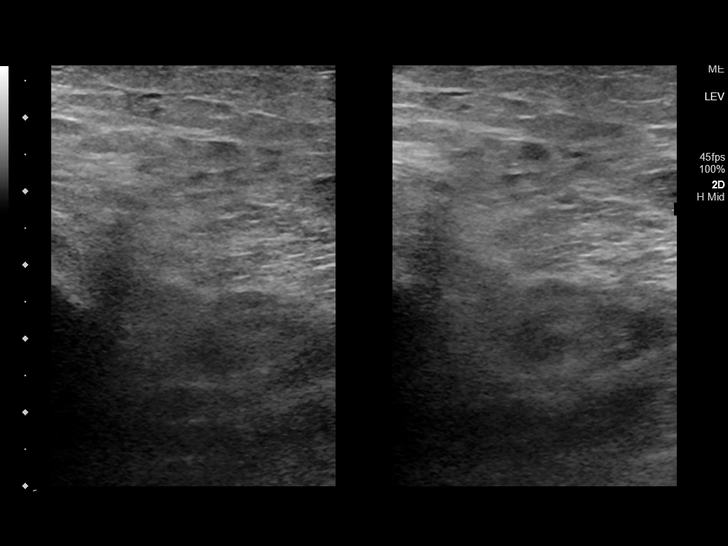

[Series 1001: lev · 2 of 4 slices shown]
[im 1/4]
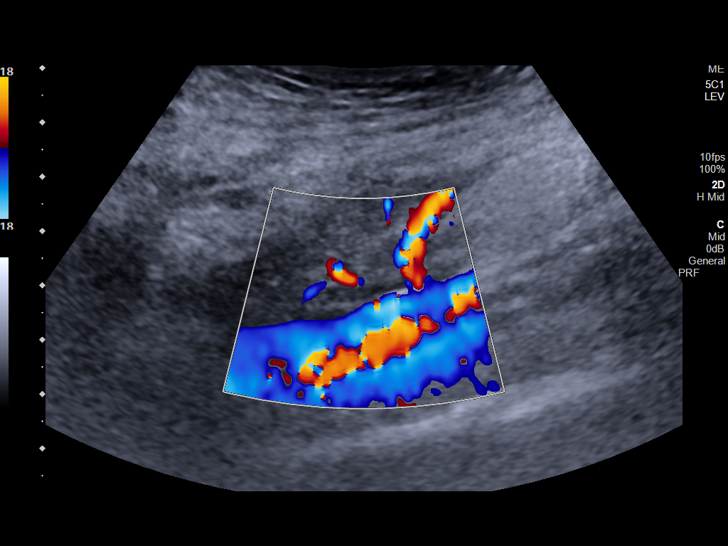
[im 4/4]
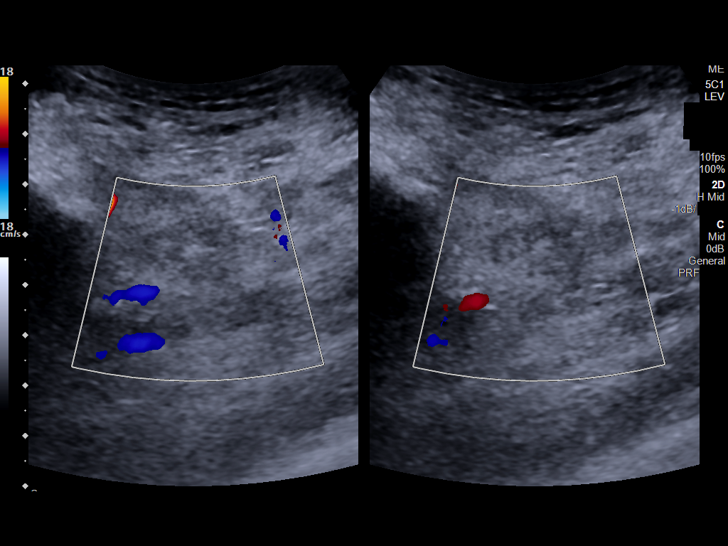

[14 of 24 positions shown; findings below may reference images not displayed]

FINDINGS: VENOUS

Normal compressibility of the common femoral, superficial femoral,
and popliteal veins, as well as the visualized calf veins.
Visualized portions of profunda femoral vein and great saphenous
vein unremarkable. No filling defects to suggest DVT on grayscale or
color Doppler imaging. Doppler waveforms show normal direction of
venous flow, normal respiratory plasticity and response to
augmentation.

Limited views of the contralateral common femoral vein are
unremarkable.

OTHER

None.

Limitations: none
IMPRESSION: Negative.

## 2021-01-19 DIAGNOSIS — Z85828 Personal history of other malignant neoplasm of skin: Secondary | ICD-10-CM | POA: Diagnosis not present

## 2021-01-31 ENCOUNTER — Encounter: Payer: Self-pay | Admitting: Family Medicine

## 2021-01-31 ENCOUNTER — Ambulatory Visit (INDEPENDENT_AMBULATORY_CARE_PROVIDER_SITE_OTHER): Payer: Medicare Other | Admitting: Family Medicine

## 2021-01-31 ENCOUNTER — Other Ambulatory Visit: Payer: Self-pay

## 2021-01-31 VITALS — BP 132/77 | HR 52 | Temp 98.3°F | Resp 16 | Ht 72.0 in | Wt 177.0 lb

## 2021-01-31 DIAGNOSIS — I2510 Atherosclerotic heart disease of native coronary artery without angina pectoris: Secondary | ICD-10-CM

## 2021-01-31 DIAGNOSIS — M1990 Unspecified osteoarthritis, unspecified site: Secondary | ICD-10-CM | POA: Diagnosis not present

## 2021-01-31 DIAGNOSIS — R2681 Unsteadiness on feet: Secondary | ICD-10-CM

## 2021-01-31 DIAGNOSIS — G629 Polyneuropathy, unspecified: Secondary | ICD-10-CM

## 2021-01-31 DIAGNOSIS — E039 Hypothyroidism, unspecified: Secondary | ICD-10-CM

## 2021-01-31 DIAGNOSIS — E785 Hyperlipidemia, unspecified: Secondary | ICD-10-CM | POA: Diagnosis not present

## 2021-01-31 DIAGNOSIS — I4891 Unspecified atrial fibrillation: Secondary | ICD-10-CM

## 2021-01-31 DIAGNOSIS — R2689 Other abnormalities of gait and mobility: Secondary | ICD-10-CM

## 2021-01-31 DIAGNOSIS — I10 Essential (primary) hypertension: Secondary | ICD-10-CM | POA: Diagnosis not present

## 2021-01-31 MED ORDER — GABAPENTIN 100 MG PO CAPS: 100.0000 mg | ORAL_CAPSULE | Freq: Three times a day (TID) | ORAL | 12 refills | Status: DC

## 2021-01-31 NOTE — Progress Notes (Signed)
I,April Miller,acting as a scribe for Wilhemena Durie, MD.,have documented all relevant documentation on the behalf of Wilhemena Durie, MD,as directed by  Wilhemena Durie, MD while in the presence of Wilhemena Durie, MD.   Established patient visit   Patient: Gary Mueller   DOB: 08-May-1941   79 y.o. Male  MRN: [id number] Visit Date: 01/31/2021  Today's healthcare provider: Wilhemena Durie, MD   Chief Complaint  Patient presents with  . Follow-up  . Hypertension  . Hyperlipidemia  . Hypothyroidism   Subjective    HPI  Patient comes in today for follow-up.  He feels well and is taking his medications.  Does complain of pain in his ankles and feet at night.  Bothers him mainly when he tries to sleep.  It feels cold at times.  When he feels his feet they are not cold.  He has not had routine labs in about a year. Hypertension, follow-up  BP Readings from Last 3 Encounters:  01/31/21 132/77  07/22/20 (!) 119/64  06/29/20 132/60   Wt Readings from Last 3 Encounters:  01/31/21 177 lb (80.3 kg)  07/22/20 173 lb 12.8 oz (78.8 kg)  06/29/20 172 lb 9.6 oz (78.3 kg)     He was last seen for hypertension 6 months ago.  BP at that visit was 119/64. Management since that visit includes; good control. He reports good compliance with treatment. He is not having side effects. none He is exercising. He is adherent to low salt diet.   Outside blood pressures are not checking.  He does not smoke.  Use of agents associated with hypertension: none.   --------------------------------------------------------------------  Lipid/Cholesterol, follow-up  Last Lipid Panel: Lab Results  Component Value Date   CHOL 193 01/28/2020   LDLCALC 134 (H) 01/28/2020   HDL 40 01/28/2020   TRIG 103 01/28/2020    He was last seen for this 1 years ago.  Management since that visit includes; on rosuvastatin.   He reports good compliance with treatment. He is not having side  effects. none He is following a Regular, Low Sodium diet. Current exercise: walking  Last metabolic panel Lab Results  Component Value Date   GLUCOSE 93 04/22/2020   NA 138 04/22/2020   K 4.1 04/22/2020   BUN 26 (H) 04/22/2020   CREATININE 0.75 04/22/2020   GFRNONAA >60 04/22/2020   GFRAA >60 04/22/2020   CALCIUM 9.4 04/22/2020   AST 25 04/22/2020   ALT 23 04/22/2020   The ASCVD Risk score (Goff DC [name], et al., 2013) failed to calculate for the following reasons:   The 2013 ASCVD risk score is only valid for ages 17 to 3  --------------------------------------------------------------------  Hypothyroid, follow-up  Lab Results  Component Value Date   TSH 1.290 01/28/2020   TSH 0.894 06/16/2019   TSH 8.280 (H) 01/10/2018   Wt Readings from Last 3 Encounters:  01/31/21 177 lb (80.3 kg)  07/22/20 173 lb 12.8 oz (78.8 kg)  06/29/20 172 lb 9.6 oz (78.3 kg)    He was last seen for hypothyroid 1 years ago.  Management since that visit includes; on levothyroxine 100 mcg. He reports good compliance with treatment. He is not having side effects. none  --------------------------------------------------------------------  PUD (peptic ulcer disease) From 07/22/2020-Restarted pantoprazole for 2 more months as he is having some recent epigastric distress.  I do think the patient has had peptic ulcer earlier this year.  Etc. GI referral if symptoms persist  despite PPI.  Patient also has complaints of pain in his ankles and feet at night. Pain has been occurring for a while.         Medications: Outpatient Medications Prior to Visit  Medication Sig  . acetaminophen (TYLENOL) 500 MG tablet Take 1,000 mg by mouth daily as needed for moderate pain.  Marland Kitchen amLODipine (NORVASC) 5 MG tablet TAKE ONE TABLET EVERY DAY (Patient taking differently: Take 5 mg by mouth daily.)  . ascorbic acid (VITAMIN C) 1000 MG tablet Take 1 tablet by mouth daily.  Marland Kitchen levothyroxine (SYNTHROID) 100 MCG  tablet TAKE 1 TABLET EVERY DAY ON EMPTY STOMACHWITH A GLASS OF WATER AT LEAST 30-60 MINBEFORE BREAKFAST  . Lutein 10 MG TABS Take 1 tablet by mouth daily.  . Multiple Vitamin (MULTIVITAMIN WITH MINERALS) TABS tablet Take 1 tablet by mouth every evening.   . pantoprazole (PROTONIX) 40 MG tablet TAKE ONE TABLET (40 MG) BY MOUTH EVERY DAY  . rosuvastatin (CRESTOR) 5 MG tablet TAKE ONE TABLET BY MOUTH EVERY DAY  . vitamin E 180 MG (400 UNITS) capsule Take 1 capsule by mouth daily.  . celecoxib (CELEBREX) 200 MG capsule Take 1 capsule (200 mg total) by mouth 2 (two) times daily. (Patient not taking: No sig reported)  . enoxaparin (LOVENOX) 40 MG/0.4ML injection Inject 0.4 mLs (40 mg total) into the skin daily for 14 days.  Marland Kitchen oxyCODONE (OXY IR/ROXICODONE) 5 MG immediate release tablet Take 1 tablet (5 mg total) by mouth every 4 (four) hours as needed for moderate pain (pain score 4-6). (Patient not taking: No sig reported)  . traMADol (ULTRAM) 50 MG tablet Take 1-2 tablets (50-100 mg total) by mouth every 4 (four) hours as needed for moderate pain. (Patient not taking: No sig reported)   No facility-administered medications prior to visit.    Review of Systems  Constitutional: Negative for appetite change, chills and fever.  Respiratory: Negative for chest tightness, shortness of breath and wheezing.   Cardiovascular: Negative for chest pain and palpitations.  Gastrointestinal: Negative for abdominal pain, nausea and vomiting.        Objective    BP 132/77 (BP Location: Left Arm, Patient Position: Sitting, Cuff Size: Normal)   Pulse (!) 52   Temp 98.3 F (36.8 C) (Oral)   Resp 16   Ht 6' (1.829 m)   Wt 177 lb (80.3 kg)   SpO2 99%   BMI 24.01 kg/m  BP Readings from Last 3 Encounters:  01/31/21 132/77  07/22/20 (!) 119/64  06/29/20 132/60   Wt Readings from Last 3 Encounters:  01/31/21 177 lb (80.3 kg)  07/22/20 173 lb 12.8 oz (78.8 kg)  06/29/20 172 lb 9.6 oz (78.3 kg)        Physical Exam Vitals reviewed.  Constitutional:      Appearance: Normal appearance. He is well-developed.  HENT:     Head: Normocephalic and atraumatic.     Right Ear: External ear normal.     Left Ear: External ear normal.     Nose: Nose normal.  Eyes:     General: No scleral icterus.    Conjunctiva/sclera: Conjunctivae normal.  Neck:     Thyroid: No thyromegaly.     Vascular: No carotid bruit.  Cardiovascular:     Rate and Rhythm: Normal rate and regular rhythm.     Pulses: Normal pulses.     Heart sounds: Normal heart sounds.  Pulmonary:     Effort: Pulmonary effort is normal.  Breath sounds: Normal breath sounds.  Abdominal:     Palpations: Abdomen is soft.  Lymphadenopathy:     Cervical: No cervical adenopathy.  Skin:    General: Skin is warm and dry.     Capillary Refill: Capillary refill takes less than 2 seconds.  Neurological:     General: No focal deficit present.     Mental Status: He is alert and oriented to person, place, and time.  Psychiatric:        Mood and Affect: Mood normal.        Behavior: Behavior normal.        Thought Content: Thought content normal.        Judgment: Judgment normal.       No results found for any visits on 01/31/21.  Assessment & Plan     1. Essential hypertension  - CBC w/Diff/Platelet - Comprehensive Metabolic Panel (CMET) - TSH - Lipid panel - Vitamin B12 - Folate  2. Adult hypothyroidism  - CBC w/Diff/Platelet - Comprehensive Metabolic Panel (CMET) - TSH - Lipid panel - Vitamin B12 - Folate  3. Hyperlipidemia, unspecified hyperlipidemia type  - CBC w/Diff/Platelet - Comprehensive Metabolic Panel (CMET) - TSH - Lipid panel - Vitamin B12 - Folate  4. Atrial fibrillation, unspecified type (HCC)  - CBC w/Diff/Platelet - Comprehensive Metabolic Panel (CMET) - TSH - Lipid panel - Vitamin B12 - Folate  5. CAD in native artery  - CBC w/Diff/Platelet - Comprehensive Metabolic Panel (CMET) -  TSH - Lipid panel - Vitamin B12 - Folate  6. Neuropathy I think his foot and ankle symptoms are neuropathy.  Lab work will treat with gabapentin.  Consider alpha folic acid in the future. - gabapentin (NEURONTIN) 100 MG capsule; Take 1 capsule (100 mg total) by mouth 3 (three) times daily.  Dispense: 30 capsule; Refill: 12 - CBC w/Diff/Platelet - Comprehensive Metabolic Panel (CMET) - TSH - Lipid panel - Vitamin B12 - Folate  7. Osteoarthritis, unspecified osteoarthritis type, unspecified site  - gabapentin (NEURONTIN) 100 MG capsule; Take 1 capsule (100 mg total) by mouth 3 (three) times daily.  Dispense: 30 capsule; Refill: 12 - CBC w/Diff/Platelet - Comprehensive Metabolic Panel (CMET) - TSH - Lipid panel - Vitamin B12 - Folate  8. Unsteadiness on feet   - Vitamin B12  9. Other abnormalities of gait and mobility   - Folate   Return in about 3 months (around 04/30/2021).         Alarik Radu Cranford Mon, MD  [facility] [phone] (phone) [phone] (fax)  [place]

## 2021-02-01 DIAGNOSIS — E039 Hypothyroidism, unspecified: Secondary | ICD-10-CM | POA: Diagnosis not present

## 2021-02-01 DIAGNOSIS — G629 Polyneuropathy, unspecified: Secondary | ICD-10-CM | POA: Diagnosis not present

## 2021-02-01 DIAGNOSIS — I10 Essential (primary) hypertension: Secondary | ICD-10-CM | POA: Diagnosis not present

## 2021-02-01 DIAGNOSIS — I4891 Unspecified atrial fibrillation: Secondary | ICD-10-CM | POA: Diagnosis not present

## 2021-02-01 DIAGNOSIS — R2681 Unsteadiness on feet: Secondary | ICD-10-CM | POA: Diagnosis not present

## 2021-02-01 DIAGNOSIS — R2689 Other abnormalities of gait and mobility: Secondary | ICD-10-CM | POA: Diagnosis not present

## 2021-02-01 DIAGNOSIS — I2510 Atherosclerotic heart disease of native coronary artery without angina pectoris: Secondary | ICD-10-CM | POA: Diagnosis not present

## 2021-02-01 DIAGNOSIS — E785 Hyperlipidemia, unspecified: Secondary | ICD-10-CM | POA: Diagnosis not present

## 2021-02-01 DIAGNOSIS — M1990 Unspecified osteoarthritis, unspecified site: Secondary | ICD-10-CM | POA: Diagnosis not present

## 2021-02-02 LAB — COMPREHENSIVE METABOLIC PANEL WITH GFR
ALT: 14 IU/L (ref 0–44)
AST: 21 IU/L (ref 0–40)
Albumin/Globulin Ratio: 2.9 — ABNORMAL HIGH (ref 1.2–2.2)
Albumin: 4.7 g/dL — ABNORMAL HIGH (ref 3.6–4.6)
Alkaline Phosphatase: 83 IU/L (ref 44–121)
BUN/Creatinine Ratio: 16 (ref 10–24)
BUN: 17 mg/dL (ref 8–27)
Bilirubin Total: 0.3 mg/dL (ref 0.0–1.2)
CO2: 22 mmol/L (ref 20–29)
Calcium: 9.6 mg/dL (ref 8.6–10.2)
Chloride: 105 mmol/L (ref 96–106)
Creatinine, Ser: 1.07 mg/dL (ref 0.76–1.27)
GFR calc Af Amer: 75 mL/min/1.73
GFR calc non Af Amer: 65 mL/min/1.73
Globulin, Total: 1.6 g/dL (ref 1.5–4.5)
Glucose: 114 mg/dL — ABNORMAL HIGH (ref 65–99)
Potassium: 4.1 mmol/L (ref 3.5–5.2)
Sodium: 142 mmol/L (ref 134–144)
Total Protein: 6.3 g/dL (ref 6.0–8.5)

## 2021-02-02 LAB — CBC WITH DIFFERENTIAL/PLATELET
Basophils Absolute: 0 x10E3/uL (ref 0.0–0.2)
Basos: 0 %
EOS (ABSOLUTE): 0.1 x10E3/uL (ref 0.0–0.4)
Eos: 1 %
Hematocrit: 42 % (ref 37.5–51.0)
Hemoglobin: 14.3 g/dL (ref 13.0–17.7)
Immature Grans (Abs): 0 x10E3/uL (ref 0.0–0.1)
Immature Granulocytes: 0 %
Lymphocytes Absolute: 2.2 x10E3/uL (ref 0.7–3.1)
Lymphs: 33 %
MCH: 31.9 pg (ref 26.6–33.0)
MCHC: 34 g/dL (ref 31.5–35.7)
MCV: 94 fL (ref 79–97)
Monocytes Absolute: 0.5 x10E3/uL (ref 0.1–0.9)
Monocytes: 8 %
Neutrophils Absolute: 3.8 x10E3/uL (ref 1.4–7.0)
Neutrophils: 58 %
Platelets: 195 x10E3/uL (ref 150–450)
RBC: 4.48 x10E6/uL (ref 4.14–5.80)
RDW: 12 % (ref 11.6–15.4)
WBC: 6.6 x10E3/uL (ref 3.4–10.8)

## 2021-02-02 LAB — LIPID PANEL
Chol/HDL Ratio: 5 ratio (ref 0.0–5.0)
Cholesterol, Total: 201 mg/dL — ABNORMAL HIGH (ref 100–199)
HDL: 40 mg/dL
LDL Chol Calc (NIH): 139 mg/dL — ABNORMAL HIGH (ref 0–99)
Triglycerides: 119 mg/dL (ref 0–149)
VLDL Cholesterol Cal: 22 mg/dL (ref 5–40)

## 2021-02-02 LAB — TSH: TSH: 2.45 u[IU]/mL (ref 0.450–4.500)

## 2021-02-02 LAB — VITAMIN B12: Vitamin B-12: 662 pg/mL (ref 232–1245)

## 2021-02-02 LAB — FOLATE: Folate: >20 ng/mL

## 2021-03-02 DIAGNOSIS — H02402 Unspecified ptosis of left eyelid: Secondary | ICD-10-CM | POA: Diagnosis not present

## 2021-03-16 DIAGNOSIS — L910 Hypertrophic scar: Secondary | ICD-10-CM | POA: Diagnosis not present

## 2021-03-16 DIAGNOSIS — Z85828 Personal history of other malignant neoplasm of skin: Secondary | ICD-10-CM | POA: Diagnosis not present

## 2021-03-16 DIAGNOSIS — L578 Other skin changes due to chronic exposure to nonionizing radiation: Secondary | ICD-10-CM | POA: Diagnosis not present

## 2021-03-21 DIAGNOSIS — E785 Hyperlipidemia, unspecified: Secondary | ICD-10-CM | POA: Diagnosis not present

## 2021-03-21 DIAGNOSIS — I2510 Atherosclerotic heart disease of native coronary artery without angina pectoris: Secondary | ICD-10-CM | POA: Diagnosis not present

## 2021-03-21 DIAGNOSIS — I493 Ventricular premature depolarization: Secondary | ICD-10-CM | POA: Diagnosis not present

## 2021-03-21 DIAGNOSIS — R0602 Shortness of breath: Secondary | ICD-10-CM | POA: Diagnosis not present

## 2021-04-01 DIAGNOSIS — H02402 Unspecified ptosis of left eyelid: Secondary | ICD-10-CM | POA: Diagnosis not present

## 2021-04-25 ENCOUNTER — Other Ambulatory Visit: Payer: Self-pay | Admitting: Family Medicine

## 2021-04-25 DIAGNOSIS — H1132 Conjunctival hemorrhage, left eye: Secondary | ICD-10-CM | POA: Diagnosis not present

## 2021-04-25 DIAGNOSIS — I10 Essential (primary) hypertension: Secondary | ICD-10-CM

## 2021-04-28 DIAGNOSIS — Z96653 Presence of artificial knee joint, bilateral: Secondary | ICD-10-CM | POA: Diagnosis not present

## 2021-05-02 DIAGNOSIS — M79641 Pain in right hand: Secondary | ICD-10-CM | POA: Diagnosis not present

## 2021-05-02 DIAGNOSIS — M79642 Pain in left hand: Secondary | ICD-10-CM | POA: Diagnosis not present

## 2021-05-02 DIAGNOSIS — M1812 Unilateral primary osteoarthritis of first carpometacarpal joint, left hand: Secondary | ICD-10-CM | POA: Diagnosis not present

## 2021-05-02 DIAGNOSIS — M1990 Unspecified osteoarthritis, unspecified site: Secondary | ICD-10-CM | POA: Diagnosis not present

## 2021-05-03 ENCOUNTER — Ambulatory Visit: Payer: Self-pay | Admitting: Family Medicine

## 2021-05-17 DIAGNOSIS — L910 Hypertrophic scar: Secondary | ICD-10-CM | POA: Diagnosis not present

## 2021-05-26 DIAGNOSIS — L570 Actinic keratosis: Secondary | ICD-10-CM | POA: Diagnosis not present

## 2021-05-26 DIAGNOSIS — Z85828 Personal history of other malignant neoplasm of skin: Secondary | ICD-10-CM | POA: Diagnosis not present

## 2021-05-26 DIAGNOSIS — D229 Melanocytic nevi, unspecified: Secondary | ICD-10-CM | POA: Diagnosis not present

## 2021-05-26 DIAGNOSIS — L821 Other seborrheic keratosis: Secondary | ICD-10-CM | POA: Diagnosis not present

## 2021-05-26 DIAGNOSIS — L814 Other melanin hyperpigmentation: Secondary | ICD-10-CM | POA: Diagnosis not present

## 2021-06-06 DIAGNOSIS — H35352 Cystoid macular degeneration, left eye: Secondary | ICD-10-CM | POA: Diagnosis not present

## 2021-06-07 DIAGNOSIS — Z79899 Other long term (current) drug therapy: Secondary | ICD-10-CM | POA: Diagnosis not present

## 2021-06-07 DIAGNOSIS — M79642 Pain in left hand: Secondary | ICD-10-CM | POA: Diagnosis not present

## 2021-06-07 DIAGNOSIS — M159 Polyosteoarthritis, unspecified: Secondary | ICD-10-CM | POA: Diagnosis not present

## 2021-06-07 DIAGNOSIS — M79641 Pain in right hand: Secondary | ICD-10-CM | POA: Diagnosis not present

## 2021-06-07 DIAGNOSIS — M1990 Unspecified osteoarthritis, unspecified site: Secondary | ICD-10-CM | POA: Diagnosis not present

## 2021-07-05 ENCOUNTER — Ambulatory Visit: Payer: Medicare Other | Admitting: Family Medicine

## 2021-07-05 DIAGNOSIS — Z85828 Personal history of other malignant neoplasm of skin: Secondary | ICD-10-CM | POA: Diagnosis not present

## 2021-07-05 DIAGNOSIS — L905 Scar conditions and fibrosis of skin: Secondary | ICD-10-CM | POA: Diagnosis not present

## 2021-07-21 ENCOUNTER — Other Ambulatory Visit: Payer: Self-pay

## 2021-07-21 ENCOUNTER — Encounter: Payer: Self-pay | Admitting: Family Medicine

## 2021-07-21 ENCOUNTER — Ambulatory Visit (INDEPENDENT_AMBULATORY_CARE_PROVIDER_SITE_OTHER): Payer: Medicare Other | Admitting: Family Medicine

## 2021-07-21 VITALS — BP 130/68 | HR 52 | Temp 98.6°F | Ht 72.0 in | Wt 176.0 lb

## 2021-07-21 DIAGNOSIS — L739 Follicular disorder, unspecified: Secondary | ICD-10-CM | POA: Diagnosis not present

## 2021-07-21 DIAGNOSIS — E039 Hypothyroidism, unspecified: Secondary | ICD-10-CM

## 2021-07-21 DIAGNOSIS — E785 Hyperlipidemia, unspecified: Secondary | ICD-10-CM

## 2021-07-21 DIAGNOSIS — I2510 Atherosclerotic heart disease of native coronary artery without angina pectoris: Secondary | ICD-10-CM | POA: Diagnosis not present

## 2021-07-21 DIAGNOSIS — M1990 Unspecified osteoarthritis, unspecified site: Secondary | ICD-10-CM

## 2021-07-21 DIAGNOSIS — I10 Essential (primary) hypertension: Secondary | ICD-10-CM | POA: Diagnosis not present

## 2021-07-21 DIAGNOSIS — I4891 Unspecified atrial fibrillation: Secondary | ICD-10-CM

## 2021-07-21 MED ORDER — BACTROBAN NASAL 2 % NA OINT: 1.0000 "application " | TOPICAL_OINTMENT | Freq: Two times a day (BID) | NASAL | 5 refills | Status: DC

## 2021-07-21 NOTE — Progress Notes (Signed)
Established patient visit   Patient: Gary Mueller   DOB: 01/27/1941   80 y.o. Male  MRN: [id number] Visit Date: 07/21/2021  Today's healthcare provider: Wilhemena Durie, MD   Chief Complaint  Patient presents with   Hypertension   Hyperlipidemia   Hypothyroidism   nose lesion   Subjective    HPI  Patient comes in today for follow-up. He has a history of MRSA with screening for surgeries. He states he has had folliculitis in his nose recently consistent with his MRSA. Hypertension, follow-up  BP Readings from Last 3 Encounters:  07/21/21 130/68  01/31/21 132/77  07/22/20 (!) 119/64   Wt Readings from Last 3 Encounters:  07/21/21 176 lb (79.8 kg)  01/31/21 177 lb (80.3 kg)  07/22/20 173 lb 12.8 oz (78.8 kg)     He was last seen for hypertension 5 months ago.  BP at that visit was 132/77. Management since that visit includes no medication changes.  He reports good compliance with treatment. He is not having side effects.  He is following a Regular diet. He is exercising. He does not smoke.  Use of agents associated with hypertension: none.   Outside blood pressures are checked occasionally. Symptoms: No chest pain No chest pressure  No palpitations No syncope  No dyspnea No orthopnea  No paroxysmal nocturnal dyspnea No lower extremity edema   Pertinent labs: Lab Results  Component Value Date   CHOL 201 (H) 02/01/2021   HDL 40 02/01/2021   LDLCALC 139 (H) 02/01/2021   TRIG 119 02/01/2021   CHOLHDL 5.0 02/01/2021   Lab Results  Component Value Date   NA 142 02/01/2021   K 4.1 02/01/2021   CREATININE 1.07 02/01/2021   GFRNONAA 65 02/01/2021   GFRAA 75 02/01/2021   GLUCOSE 114 (H) 02/01/2021     The ASCVD Risk score (Goff DC [name], et al., 2013) failed to calculate for the following reasons:   The 2013 ASCVD risk score is only valid for ages 36 to 82    Lipid/Cholesterol, Follow-up  Last lipid panel Other pertinent labs  Lab Results   Component Value Date   CHOL 201 (H) 02/01/2021   HDL 40 02/01/2021   LDLCALC 139 (H) 02/01/2021   TRIG 119 02/01/2021   CHOLHDL 5.0 02/01/2021   Lab Results  Component Value Date   ALT 14 02/01/2021   AST 21 02/01/2021   PLT 195 02/01/2021   TSH 2.450 02/01/2021     He was last seen for this 5 months ago.  Management since that visit includes no medication changes.  He reports good compliance with treatment. He is not having side effects.   Symptoms: No chest pain No chest pressure/discomfort  No dyspnea No lower extremity edema  No numbness or tingling of extremity No orthopnea  No palpitations No paroxysmal nocturnal dyspnea  No speech difficulty No syncope   Current diet: well balanced Current exercise: walking  The ASCVD Risk score Mikey Bussing DC [name], et al., 2013) failed to calculate for the following reasons:   The 2013 ASCVD risk score is only valid for ages 31 to 36  Hypothyroid, follow-up  Lab Results  Component Value Date   TSH 2.450 02/01/2021   TSH 1.290 01/28/2020   TSH 0.894 06/16/2019   Wt Readings from Last 3 Encounters:  07/21/21 176 lb (79.8 kg)  01/31/21 177 lb (80.3 kg)  07/22/20 173 lb 12.8 oz (78.8 kg)    He was last  seen for hypothyroid 5 months ago.  Management since that visit includes no medication changes. He reports good compliance with treatment. He is not having side effects.   Symptoms: No change in energy level No constipation  No diarrhea No heat / cold intolerance  No nervousness No palpitations  No weight changes        Medications: Outpatient Medications Prior to Visit  Medication Sig   acetaminophen (TYLENOL) 500 MG tablet Take 1,000 mg by mouth daily as needed for moderate pain.   amLODipine (NORVASC) 5 MG tablet TAKE ONE TABLET EVERY DAY   ascorbic acid (VITAMIN C) 1000 MG tablet Take 1 tablet by mouth daily.   gabapentin (NEURONTIN) 100 MG capsule Take 1 capsule (100 mg total) by mouth 3 (three) times daily.    levothyroxine (SYNTHROID) 100 MCG tablet TAKE 1 TABLET EVERY DAY ON EMPTY STOMACHWITH A GLASS OF WATER AT LEAST 30-60 MINBEFORE BREAKFAST   Multiple Vitamin (MULTIVITAMIN WITH MINERALS) TABS tablet Take 1 tablet by mouth every evening.    pantoprazole (PROTONIX) 40 MG tablet TAKE ONE TABLET (40 MG) BY MOUTH EVERY DAY   rosuvastatin (CRESTOR) 5 MG tablet TAKE ONE TABLET BY MOUTH EVERY DAY   vitamin E 180 MG (400 UNITS) capsule Take 1 capsule by mouth daily.   celecoxib (CELEBREX) 200 MG capsule Take 1 capsule (200 mg total) by mouth 2 (two) times daily.   enoxaparin (LOVENOX) 40 MG/0.4ML injection Inject 0.4 mLs (40 mg total) into the skin daily for 14 days.   Lutein 10 MG TABS Take 1 tablet by mouth daily.   oxyCODONE (OXY IR/ROXICODONE) 5 MG immediate release tablet Take 1 tablet (5 mg total) by mouth every 4 (four) hours as needed for moderate pain (pain score 4-6). (Patient not taking: No sig reported)   traMADol (ULTRAM) 50 MG tablet Take 1-2 tablets (50-100 mg total) by mouth every 4 (four) hours as needed for moderate pain.   No facility-administered medications prior to visit.    Review of Systems  Constitutional:  Negative for activity change and fatigue.  Respiratory:  Negative for cough and shortness of breath.   Musculoskeletal:  Negative for arthralgias and myalgias.  Neurological:  Negative for dizziness, light-headedness and headaches.  Psychiatric/Behavioral:  Negative for self-injury, sleep disturbance and suicidal ideas. The patient is not nervous/anxious.        Objective    BP 130/68   Pulse (!) 52   Temp 98.6 F (37 C)   Ht 6' (1.829 m)   Wt 176 lb (79.8 kg)   BMI 23.87 kg/m  BP Readings from Last 3 Encounters:  07/21/21 130/68  01/31/21 132/77  07/22/20 (!) 119/64   Wt Readings from Last 3 Encounters:  07/21/21 176 lb (79.8 kg)  01/31/21 177 lb (80.3 kg)  07/22/20 173 lb 12.8 oz (78.8 kg)       Physical Exam Vitals reviewed.  Constitutional:       Appearance: Normal appearance. He is well-developed.  HENT:     Head: Normocephalic and atraumatic.     Right Ear: External ear normal.     Left Ear: External ear normal.     Nose: Nose normal.  Eyes:     General: No scleral icterus.    Conjunctiva/sclera: Conjunctivae normal.  Neck:     Thyroid: No thyromegaly.     Vascular: No carotid bruit.  Cardiovascular:     Rate and Rhythm: Normal rate and regular rhythm.     Pulses: Normal pulses.  Heart sounds: Normal heart sounds.  Pulmonary:     Effort: Pulmonary effort is normal.     Breath sounds: Normal breath sounds.  Abdominal:     Palpations: Abdomen is soft.  Lymphadenopathy:     Cervical: No cervical adenopathy.  Skin:    General: Skin is warm and dry.     Capillary Refill: Capillary refill takes less than 2 seconds.  Neurological:     General: No focal deficit present.     Mental Status: He is alert and oriented to person, place, and time.  Psychiatric:        Mood and Affect: Mood normal.        Behavior: Behavior normal.        Thought Content: Thought content normal.        Judgment: Judgment normal.      No results found for any visits on 07/21/21.  Assessment & Plan     1. Essential hypertension Good control on amlodipine  2. Adult hypothyroidism Followed levels for treatment with Synthroid  3. Hyperlipidemia, unspecified hyperlipidemia type On rosuvastatin  4. Folliculitis History of MRSA.  Give refills on mupirocin - mupirocin nasal ointment (BACTROBAN NASAL) 2 %; Place 1 application into the nose 2 (two) times daily. Use one-half of tube in each nostril twice daily for five (5) days. After application, press sides of nose together and gently massage.  Dispense: 10 g; Refill: 5  5. Atrial fibrillation, unspecified type ([place]) Followed by cardiology  6. CAD in native artery All risk factors treated.  7. Osteoarthritis, unspecified osteoarthritis type, unspecified site On Celebrex as  needed.   No follow-ups on file.      I, Wilhemena Durie, MD, have reviewed all documentation for this visit. The documentation on 08/06/21 for the exam, diagnosis, procedures, and orders are all accurate and complete.    Chellsea Beckers Cranford Mon, MD  [facility] [phone] (phone) [phone] (fax)  [place]

## 2021-07-28 ENCOUNTER — Other Ambulatory Visit: Payer: Self-pay | Admitting: Family Medicine

## 2021-07-28 DIAGNOSIS — I10 Essential (primary) hypertension: Secondary | ICD-10-CM

## 2021-07-28 NOTE — Telephone Encounter (Signed)
Future OV 01/19/22. Approved per protocol.  Requested Prescriptions  Pending Prescriptions Disp Refills  . amLODipine (NORVASC) 5 MG tablet [Pharmacy Med Name: AMLODIPINE BESYLATE 5 MG TAB] 90 tablet 0    Sig: TAKE ONE TABLET EVERY DAY     Cardiovascular:  Calcium Channel Blockers Passed - 07/28/2021 11:15 AM      Passed - Last BP in normal range    BP Readings from Last 1 Encounters:  07/21/21 130/68         Passed - Valid encounter within last 6 months    Recent Outpatient Visits          1 week ago Essential hypertension   [facility] Jerrol Banana., MD   5 months ago Essential hypertension   [facility] Jerrol Banana., MD   1 year ago Essential hypertension   [facility] Jerrol Banana., MD   1 year ago PUD (peptic ulcer disease)   [facility] Jerrol Banana., MD   1 year ago Essential hypertension   [facility] Jerrol Banana., MD      Future Appointments            In 5 months Jerrol Banana., MD [facility], [place]

## 2021-08-11 ENCOUNTER — Other Ambulatory Visit: Payer: Self-pay | Admitting: Family Medicine

## 2021-08-11 NOTE — Telephone Encounter (Signed)
   Notes to clinic:  last filled on 01/23/2021 Review for continued use    Requested Prescriptions  Pending Prescriptions Disp Refills   pantoprazole (PROTONIX) 40 MG tablet [Pharmacy Med Name: PANTOPRAZOLE SODIUM 40 MG DR TAB] 30 tablet 11    Sig: TAKE ONE TABLET (40 MG) BY MOUTH EVERY DAY     Gastroenterology: Proton Pump Inhibitors Passed - 08/11/2021  1:02 PM      Passed - Valid encounter within last 12 months    Recent Outpatient Visits           3 weeks ago Essential hypertension   [facility] Jerrol Banana., MD   6 months ago Essential hypertension   [facility] Jerrol Banana., MD   1 year ago Essential hypertension   [facility] Jerrol Banana., MD   1 year ago PUD (peptic ulcer disease)   [facility] Jerrol Banana., MD   1 year ago Essential hypertension   [facility] Jerrol Banana., MD       Future Appointments             In 5 months Jerrol Banana., MD [facility], [place]

## 2021-08-22 DIAGNOSIS — L814 Other melanin hyperpigmentation: Secondary | ICD-10-CM | POA: Diagnosis not present

## 2021-08-22 DIAGNOSIS — D489 Neoplasm of uncertain behavior, unspecified: Secondary | ICD-10-CM | POA: Diagnosis not present

## 2021-08-22 DIAGNOSIS — L988 Other specified disorders of the skin and subcutaneous tissue: Secondary | ICD-10-CM | POA: Diagnosis not present

## 2021-08-22 DIAGNOSIS — L578 Other skin changes due to chronic exposure to nonionizing radiation: Secondary | ICD-10-CM | POA: Diagnosis not present

## 2021-08-22 DIAGNOSIS — L570 Actinic keratosis: Secondary | ICD-10-CM | POA: Diagnosis not present

## 2021-08-25 ENCOUNTER — Telehealth: Payer: Self-pay

## 2021-08-25 NOTE — Telephone Encounter (Signed)
No mention in patient's chart were anyone has tried to call him.

## 2021-08-25 NOTE — Telephone Encounter (Signed)
Copied from [place] [id number]. Topic: General - Other >> Aug 25, 2021  2:42 PM Camille Bal, [place] wrote: Reason for [id number] called says missed call, not sure what its about. Please call back

## 2021-09-07 DIAGNOSIS — Z79899 Other long term (current) drug therapy: Secondary | ICD-10-CM | POA: Diagnosis not present

## 2021-09-07 DIAGNOSIS — M159 Polyosteoarthritis, unspecified: Secondary | ICD-10-CM | POA: Diagnosis not present

## 2021-10-03 DIAGNOSIS — I493 Ventricular premature depolarization: Secondary | ICD-10-CM | POA: Diagnosis not present

## 2021-10-03 DIAGNOSIS — R0602 Shortness of breath: Secondary | ICD-10-CM | POA: Diagnosis not present

## 2021-10-03 DIAGNOSIS — I2510 Atherosclerotic heart disease of native coronary artery without angina pectoris: Secondary | ICD-10-CM | POA: Diagnosis not present

## 2021-10-03 DIAGNOSIS — E785 Hyperlipidemia, unspecified: Secondary | ICD-10-CM | POA: Diagnosis not present

## 2021-10-03 DIAGNOSIS — Z23 Encounter for immunization: Secondary | ICD-10-CM | POA: Diagnosis not present

## 2021-10-13 DIAGNOSIS — M65311 Trigger thumb, right thumb: Secondary | ICD-10-CM | POA: Diagnosis not present

## 2021-10-13 DIAGNOSIS — M159 Polyosteoarthritis, unspecified: Secondary | ICD-10-CM | POA: Diagnosis not present

## 2021-10-13 DIAGNOSIS — Z79899 Other long term (current) drug therapy: Secondary | ICD-10-CM | POA: Diagnosis not present

## 2021-10-13 DIAGNOSIS — M1990 Unspecified osteoarthritis, unspecified site: Secondary | ICD-10-CM | POA: Diagnosis not present

## 2021-10-18 ENCOUNTER — Encounter: Payer: Self-pay | Admitting: Intensive Care

## 2021-10-18 ENCOUNTER — Inpatient Hospital Stay
Admission: EM | Admit: 2021-10-18 | Discharge: 2021-10-20 | DRG: 247 | Disposition: A | Discharge status: Home / Self Care | Payer: Medicare Other | Attending: Internal Medicine | Admitting: Internal Medicine

## 2021-10-18 ENCOUNTER — Other Ambulatory Visit: Payer: Self-pay

## 2021-10-18 ENCOUNTER — Emergency Department: Payer: Medicare Other

## 2021-10-18 DIAGNOSIS — Z9049 Acquired absence of other specified parts of digestive tract: Secondary | ICD-10-CM | POA: Diagnosis not present

## 2021-10-18 DIAGNOSIS — I2510 Atherosclerotic heart disease of native coronary artery without angina pectoris: Secondary | ICD-10-CM | POA: Diagnosis present

## 2021-10-18 DIAGNOSIS — I214 Non-ST elevation (NSTEMI) myocardial infarction: Secondary | ICD-10-CM

## 2021-10-18 DIAGNOSIS — Z20822 Contact with and (suspected) exposure to covid-19: Secondary | ICD-10-CM | POA: Diagnosis present

## 2021-10-18 DIAGNOSIS — I4891 Unspecified atrial fibrillation: Secondary | ICD-10-CM | POA: Diagnosis present

## 2021-10-18 DIAGNOSIS — Z888 Allergy status to other drugs, medicaments and biological substances status: Secondary | ICD-10-CM | POA: Diagnosis not present

## 2021-10-18 DIAGNOSIS — Z8249 Family history of ischemic heart disease and other diseases of the circulatory system: Secondary | ICD-10-CM | POA: Diagnosis not present

## 2021-10-18 DIAGNOSIS — K219 Gastro-esophageal reflux disease without esophagitis: Secondary | ICD-10-CM

## 2021-10-18 DIAGNOSIS — Z885 Allergy status to narcotic agent status: Secondary | ICD-10-CM | POA: Diagnosis not present

## 2021-10-18 DIAGNOSIS — Z85828 Personal history of other malignant neoplasm of skin: Secondary | ICD-10-CM | POA: Diagnosis not present

## 2021-10-18 DIAGNOSIS — H9193 Unspecified hearing loss, bilateral: Secondary | ICD-10-CM | POA: Diagnosis present

## 2021-10-18 DIAGNOSIS — M1990 Unspecified osteoarthritis, unspecified site: Secondary | ICD-10-CM | POA: Diagnosis present

## 2021-10-18 DIAGNOSIS — M069 Rheumatoid arthritis, unspecified: Secondary | ICD-10-CM | POA: Diagnosis present

## 2021-10-18 DIAGNOSIS — Z79890 Hormone replacement therapy (postmenopausal): Secondary | ICD-10-CM

## 2021-10-18 DIAGNOSIS — I10 Essential (primary) hypertension: Secondary | ICD-10-CM | POA: Diagnosis present

## 2021-10-18 DIAGNOSIS — Z79891 Long term (current) use of opiate analgesic: Secondary | ICD-10-CM | POA: Diagnosis not present

## 2021-10-18 DIAGNOSIS — Z87891 Personal history of nicotine dependence: Secondary | ICD-10-CM

## 2021-10-18 DIAGNOSIS — R001 Bradycardia, unspecified: Secondary | ICD-10-CM | POA: Diagnosis not present

## 2021-10-18 DIAGNOSIS — E039 Hypothyroidism, unspecified: Secondary | ICD-10-CM | POA: Diagnosis present

## 2021-10-18 DIAGNOSIS — Z7982 Long term (current) use of aspirin: Secondary | ICD-10-CM

## 2021-10-18 DIAGNOSIS — I200 Unstable angina: Secondary | ICD-10-CM

## 2021-10-18 DIAGNOSIS — Z79899 Other long term (current) drug therapy: Secondary | ICD-10-CM | POA: Diagnosis not present

## 2021-10-18 DIAGNOSIS — E785 Hyperlipidemia, unspecified: Secondary | ICD-10-CM | POA: Diagnosis present

## 2021-10-18 DIAGNOSIS — I252 Old myocardial infarction: Secondary | ICD-10-CM | POA: Diagnosis not present

## 2021-10-18 DIAGNOSIS — J309 Allergic rhinitis, unspecified: Secondary | ICD-10-CM | POA: Diagnosis present

## 2021-10-18 DIAGNOSIS — I25110 Atherosclerotic heart disease of native coronary artery with unstable angina pectoris: Secondary | ICD-10-CM | POA: Diagnosis present

## 2021-10-18 DIAGNOSIS — F419 Anxiety disorder, unspecified: Secondary | ICD-10-CM | POA: Diagnosis present

## 2021-10-18 DIAGNOSIS — Z96653 Presence of artificial knee joint, bilateral: Secondary | ICD-10-CM | POA: Diagnosis present

## 2021-10-18 DIAGNOSIS — R079 Chest pain, unspecified: Secondary | ICD-10-CM | POA: Diagnosis not present

## 2021-10-18 HISTORY — DX: Non-ST elevation (NSTEMI) myocardial infarction: I21.4

## 2021-10-18 LAB — BASIC METABOLIC PANEL
Anion gap: 8 (ref 5–15)
BUN: 27 mg/dL — ABNORMAL HIGH (ref 8–23)
CO2: 25 mmol/L (ref 22–32)
Calcium: 9.6 mg/dL (ref 8.9–10.3)
Chloride: 106 mmol/L (ref 98–111)
Creatinine, Ser: 0.94 mg/dL (ref 0.61–1.24)
GFR, Estimated: >60 mL/min (ref >60)
Glucose, Bld: 147 mg/dL — ABNORMAL HIGH (ref 70–99)
Potassium: 4.2 mmol/L (ref 3.5–5.1)
Sodium: 139 mmol/L (ref 135–145)

## 2021-10-18 LAB — TROPONIN I (HIGH SENSITIVITY)
Troponin I (High Sensitivity): 822 ng/L (ref <18)
Troponin I (High Sensitivity): 819 ng/L (ref <18)

## 2021-10-18 LAB — CBC
HCT: 41.2 % (ref 39.0–52.0)
Hemoglobin: 14.2 g/dL (ref 13.0–17.0)
MCH: 32.7 pg (ref 26.0–34.0)
MCHC: 34.5 g/dL (ref 30.0–36.0)
MCV: 94.9 fL (ref 80.0–100.0)
Platelets: 200 10*3/uL (ref 150–400)
RBC: 4.34 MIL/uL (ref 4.22–5.81)
RDW: 13.2 % (ref 11.5–15.5)
WBC: 9.3 10*3/uL (ref 4.0–10.5)
nRBC: 0 % (ref 0.0–0.2)

## 2021-10-18 LAB — APTT: aPTT: 30 seconds (ref 24–36)

## 2021-10-18 LAB — PROTIME-INR
INR: 1 (ref 0.8–1.2)
Prothrombin Time: 13.4 seconds (ref 11.4–15.2)

## 2021-10-18 MED ORDER — CARVEDILOL 3.125 MG PO TABS
3.1250 mg | ORAL_TABLET | Freq: Two times a day (BID) | ORAL | Status: DC
  Administered 2021-10-20: 3.125 mg via ORAL
  Filled 2021-10-18: qty 1

## 2021-10-18 MED ORDER — ACETAMINOPHEN 325 MG PO TABS: 650.0000 mg | ORAL_TABLET | ORAL | Status: DC | PRN

## 2021-10-18 MED ORDER — HEPARIN (PORCINE) 25000 UT/250ML-% IV SOLN
900.0000 [IU]/h | INTRAVENOUS | Status: DC
  Administered 2021-10-18: 900 [IU]/h via INTRAVENOUS
  Filled 2021-10-18: qty 250

## 2021-10-18 MED ORDER — ASPIRIN EC 81 MG PO TBEC: 81.0000 mg | DELAYED_RELEASE_TABLET | Freq: Every day | ORAL | Status: DC

## 2021-10-18 MED ORDER — HEPARIN BOLUS VIA INFUSION
4000.0000 [IU] | Freq: Once | INTRAVENOUS | Status: AC
  Administered 2021-10-18: 4000 [IU] via INTRAVENOUS
  Filled 2021-10-18: qty 4000

## 2021-10-18 MED ORDER — NITROGLYCERIN 0.4 MG SL SUBL: 0.4000 mg | SUBLINGUAL_TABLET | SUBLINGUAL | Status: DC | PRN

## 2021-10-18 MED ORDER — NITROGLYCERIN 0.4 MG SL SUBL
0.4000 mg | SUBLINGUAL_TABLET | SUBLINGUAL | Status: DC | PRN
  Administered 2021-10-18: 0.4 mg via SUBLINGUAL
  Filled 2021-10-18 (×2): qty 1

## 2021-10-18 MED ORDER — ONDANSETRON HCL 4 MG/2ML IJ SOLN: 4.0000 mg | Freq: Four times a day (QID) | INTRAMUSCULAR | Status: DC | PRN

## 2021-10-18 MED ORDER — ASPIRIN 81 MG PO CHEW
324.0000 mg | CHEWABLE_TABLET | Freq: Once | ORAL | Status: AC
  Administered 2021-10-18: 324 mg via ORAL
  Filled 2021-10-18: qty 4

## 2021-10-18 NOTE — ED Notes (Signed)
Family at bedside. NADN

## 2021-10-18 NOTE — Progress Notes (Addendum)
[facility] for heparin Indication: chest pain/ACS  Allergies  Allergen Reactions   Codeine     GI intolerance; hallucinations    Statins     Muscle pain    Patient Measurements: Height: 6' (182.9 cm) Weight: 77.1 kg (170 lb) IBW/kg (Calculated) : 77.6 Heparin Dosing Weight: 77 kg  Vital Signs: Temp: 98 F (36.7 C) (10/25 1851) Temp Source: Oral (10/25 1851) BP: 134/72 (10/25 2133) Pulse Rate: 61 (10/25 2108)  Labs: Recent Labs    10/18/21 1846 10/18/21 2117  HGB 14.2  --   HCT 41.2  --   PLT 200  --   APTT  --  30  LABPROT 13.4  --   INR 1.0  --   CREATININE 0.94  --   TROPONINIHS 822*  --     Estimated Creatinine Clearance: 67.2 mL/min (by C-G formula based on SCr of 0.94 mg/dL).   Medical History: Past Medical History:  Diagnosis Date   Allergy    Anxiety    Arthritis    Cancer ([place]) 10/20/2016   Basal Cell Skin Cancer; lip, neck   Cystoid macular edema of left eye 07/07/2019   Depression    Dysrhythmia    A FIB   GERD (gastroesophageal reflux disease)    HOH (hard of hearing)    Bilateral   Hyperlipidemia    Hypertension    Patient denies   Hypothyroidism    IBS (irritable bowel syndrome)    Inguinal hernia    bilateral   Pneumonia    Thyroid disease    Tinnitus of both ears      Assessment: 80 year old male presented with chest pain. Pharmacy consult for heparin management.  Goal of Therapy:  Heparin level 0.3-0.7 units/ml Monitor platelets by anticoagulation protocol: Yes   Plan:  Heparin 4000 unit bolus Start heparin infusion at 900 units/hr Check HL 10/26 at 0600 CBC daily while on heparin  Tawnya Crook, PharmD, BCPS Clinical Pharmacist 10/18/2021 9:44 PM

## 2021-10-18 NOTE — ED Notes (Signed)
Lab reports high troponin; acuity level changed; will take pt to next available room

## 2021-10-18 NOTE — ED Notes (Signed)
Pt to room 12; assisted into hosp gown & on card monitor; Dr Cheri Fowler notified of troponin results; care nurse B, Norris RN notified of pt and results as well

## 2021-10-18 NOTE — ED Notes (Signed)
Pt alert and family at bedside. NADN

## 2021-10-18 NOTE — ED Triage Notes (Signed)
Patient c/o chest pain that started this AM and has not subsided. Similar episode last week that subsided. Reports pain radiates down left arm.

## 2021-10-18 NOTE — ED Provider Notes (Signed)
[facility] Emergency Department Provider Note   ____________________________________________   Event Date/Time   First MD Initiated Contact with Patient 10/18/21 2046     (approximate)  I have reviewed the triage vital signs and the nursing notes.   HISTORY  Chief Complaint Chest Pain    HPI Gary Mueller is a 80 y.o. male who presents for chest pain  LOCATION: Left chest DURATION: Began approximately 12 hours prior to arrival TIMING: Stable since onset SEVERITY: Severe QUALITY: Chest pressure CONTEXT: Patient states he has a history of coronary artery disease currently being followed by Dr. Saralyn Pilar in cardiology and began having left-sided/substernal chest pain that radiates to the left arm MODIFYING FACTORS: Patient states that exertion slightly worsens this pain and is partially relieved at rest ASSOCIATED SYMPTOMS: Left arm pain   Per medical record review, patient has history of CAD          Past Medical History:  Diagnosis Date   Allergy    Anxiety    Arthritis    Cancer ([place]) 10/20/2016   Basal Cell Skin Cancer; lip, neck   Cystoid macular edema of left eye 07/07/2019   Depression    Dysrhythmia    A FIB   GERD (gastroesophageal reflux disease)    HOH (hard of hearing)    Bilateral   Hyperlipidemia    Hypertension    Patient denies   Hypothyroidism    IBS (irritable bowel syndrome)    Inguinal hernia    bilateral   Pneumonia    Thyroid disease    Tinnitus of both ears     Patient Active Problem List   Diagnosis Date Noted   Total knee replacement status 04/30/2020   Primary osteoarthritis of right knee 03/28/2020   CME (cystoid macular edema), left 07/07/2019   Nonexudative age-related macular degeneration, bilateral, intermediate dry stage 07/07/2019   SOB (shortness of breath) on exertion 08/22/2017   Chest pain with high risk for cardiac etiology 08/22/2017   Recurrent left inguinal hernia 01/04/2017    Allergic rhinitis 06/17/2015   CAD in native artery 06/17/2015   A-fib ([place]) 06/17/2015   Back ache 06/17/2015   Gonalgia 06/17/2015   Polypharmacy 06/17/2015   Cold sore 06/17/2015   Difficulty hearing 06/17/2015   HLD (hyperlipidemia) 06/17/2015   BP (high blood pressure) 06/17/2015   Adult hypothyroidism 06/17/2015   Meniere's disease 06/17/2015   Buzzing in ear 06/17/2015   Beat, premature ventricular 06/17/2015   Status post total left knee replacement 04/13/2015    Past Surgical History:  Procedure Laterality Date   ANTERIOR VITRECTOMY Left 11/28/2018   Procedure: ANTERIOR VITRECTOMY;  Surgeon: Marchia Meiers, MD;  Location: [facility] ORS;  Service: Ophthalmology;  Laterality: Left;   CARDIAC CATHETERIZATION     CATARACT EXTRACTION W/PHACO Right 10/10/2018   Procedure: CATARACT EXTRACTION PHACO AND INTRAOCULAR LENS PLACEMENT ([place]);  Surgeon: Marchia Meiers, MD;  Location: [facility] ORS;  Service: Ophthalmology;  Laterality: Right;  [place] 00:41 CDE 6.87 Fluid pack lot # [id number]   CATARACT EXTRACTION W/PHACO Left 11/28/2018   Procedure: CATARACT EXTRACTION PHACO AND INTRAOCULAR LENS PLACEMENT ([facility])-LEFT;  Surgeon: Marchia Meiers, MD;  Location: [facility] ORS;  Service: Ophthalmology;  Laterality: Left;  [place] 00:47.7 CDE 8.92 Fluid Pack lot # [id number]   CHOLECYSTECTOMY  1968   CHONDROPLASTY Right 01/02/2018   Procedure: CHONDROPLASTY;  Surgeon: Dereck Leep, MD;  Location: [facility] ORS;  Service: Orthopedics;  Laterality: Right;   COLONOSCOPY  2014   EYE  SURGERY     HERNIA REPAIR Left 04/18/1996   inguinal hernia repair/ Dr Bary Castilla   HERNIA REPAIR Right 02/26/1996   Dr Bary Castilla   HERNIA REPAIR Left 08/07/2002   Dr Bary Castilla   JOINT REPLACEMENT     KNEE ARTHROPLASTY Right 04/30/2020   Procedure: COMPUTER ASSISTED TOTAL KNEE ARTHROPLASTY;  Surgeon: Dereck Leep, MD;  Location: [facility] ORS;  Service: Orthopedics;  Laterality: Right;   KNEE ARTHROSCOPY Right 01/02/2018   Procedure: ARTHROSCOPY KNEE;   Surgeon: Dereck Leep, MD;  Location: [facility] ORS;  Service: Orthopedics;  Laterality: Right;   KNEE ARTHROSCOPY Left 02/14/2012   partial menisectomy and chondroplasty   KNEE ARTHROSCOPY WITH MEDIAL MENISECTOMY Right 01/02/2018   Procedure: KNEE ARTHROSCOPY WITH MEDIAL MENISECTOMY;  Surgeon: Dereck Leep, MD;  Location: [facility] ORS;  Service: Orthopedics;  Laterality: Right;   TONSILLECTOMY     as a child   TOTAL KNEE ARTHROPLASTY Left 03/29/2015   [facility] Dr. Marry Guan   VASECTOMY      Prior to Admission medications   Medication Sig Start Date End Date Taking? Authorizing Provider  acetaminophen (TYLENOL) 500 MG tablet Take 1,000 mg by mouth daily as needed for moderate pain.    [provider]  amLODipine (NORVASC) 5 MG tablet TAKE ONE TABLET EVERY DAY 07/28/21   Jerrol Banana., MD  ascorbic acid (VITAMIN C) 1000 MG tablet Take 1 tablet by mouth daily.    [provider]  celecoxib (CELEBREX) 200 MG capsule Take 1 capsule (200 mg total) by mouth 2 (two) times daily. 05/02/20   Duanne Guess, PA-C  enoxaparin (LOVENOX) 40 MG/0.4ML injection Inject 0.4 mLs (40 mg total) into the skin daily for 14 days. 05/02/20 05/16/20  Duanne Guess, PA-C  gabapentin (NEURONTIN) 100 MG capsule Take 1 capsule (100 mg total) by mouth 3 (three) times daily. 01/31/21   Jerrol Banana., MD  levothyroxine (SYNTHROID) 100 MCG tablet TAKE 1 TABLET EVERY DAY ON EMPTY STOMACHWITH A GLASS OF WATER AT LEAST 30-60 MINBEFORE BREAKFAST 11/23/20   Jerrol Banana., MD  Lutein 10 MG TABS Take 1 tablet by mouth daily.    [provider]  Multiple Vitamin (MULTIVITAMIN WITH MINERALS) TABS tablet Take 1 tablet by mouth every evening.     [provider]  mupirocin nasal ointment (BACTROBAN NASAL) 2 % Place 1 application into the nose 2 (two) times daily. Use one-half of tube in each nostril twice daily for five (5) days. After application, press sides of nose together and gently  massage. 07/21/21   Jerrol Banana., MD  oxyCODONE (OXY IR/ROXICODONE) 5 MG immediate release tablet Take 1 tablet (5 mg total) by mouth every 4 (four) hours as needed for moderate pain (pain score 4-6). Patient not taking: No sig reported 05/02/20   Duanne Guess, PA-C  pantoprazole (PROTONIX) 40 MG tablet TAKE ONE TABLET (40 MG) BY MOUTH EVERY DAY 08/11/21   Jerrol Banana., MD  rosuvastatin (CRESTOR) 5 MG tablet TAKE ONE TABLET BY MOUTH EVERY DAY 05/03/20   Jerrol Banana., MD  traMADol (ULTRAM) 50 MG tablet Take 1-2 tablets (50-100 mg total) by mouth every 4 (four) hours as needed for moderate pain. 05/02/20   Duanne Guess, PA-C  vitamin E 180 MG (400 UNITS) capsule Take 1 capsule by mouth daily.    [provider]    Allergies Codeine and Statins  Family History  Problem Relation Age of  Onset   Heart disease Mother        A fib   Healthy Sister     Social History Social History   Tobacco Use   Smoking status: Former    Packs/day: 1.50    Types: Cigarettes    Quit date: 12/24/1964    Years since quitting: 56.8   Smokeless tobacco: Never  Vaping Use   Vaping Use: Never used  Substance Use Topics   Alcohol use: Yes    Comment: 0-1 beer or glass of wine monthly   Drug use: No    Review of Systems Constitutional: No fever/chills Eyes: No visual changes. ENT: No sore throat. Cardiovascular: Endorses chest pain. Respiratory: Denies shortness of breath. Gastrointestinal: No abdominal pain.  No nausea, no vomiting.  No diarrhea. Genitourinary: Negative for dysuria. Musculoskeletal: Negative for acute arthralgias Skin: Negative for rash. Neurological: Negative for headaches, weakness/numbness/paresthesias in any extremity Psychiatric: Negative for suicidal ideation/homicidal ideation   ____________________________________________   PHYSICAL EXAM:  VITAL SIGNS: ED Triage Vitals [10/18/21 1851]  Enc Vitals Group     BP 128/73      Pulse Rate 68     Resp 18     Temp 98 F (36.7 C)     Temp Source Oral     SpO2 98 %     Weight 170 lb (77.1 kg)     Height 6' (1.829 m)     Head Circumference      Peak Flow      Pain Score 7     Pain Loc      Pain Edu?      Excl. in [place]?    Constitutional: Alert and oriented. Well appearing and in no acute distress. Eyes: Conjunctivae are normal. PERRL. Head: Atraumatic. Nose: No congestion/rhinnorhea. Mouth/Throat: Mucous membranes are moist. Neck: No stridor Cardiovascular: Grossly normal heart sounds.  Good peripheral circulation. Respiratory: Normal respiratory effort.  No retractions. Gastrointestinal: Soft and nontender. No distention. Musculoskeletal: No obvious deformities Neurologic:  Normal speech and language. No gross focal neurologic deficits are appreciated. Skin:  Skin is warm and dry. No rash noted. Psychiatric: Mood and affect are normal. Speech and behavior are normal.  ____________________________________________   LABS (all labs ordered are listed, but only abnormal results are displayed)  Labs Reviewed  BASIC METABOLIC PANEL - Abnormal; Notable for the following components:      Result Value   Glucose, Bld 147 (*)    BUN 27 (*)    All other components within normal limits  TROPONIN I (HIGH SENSITIVITY) - Abnormal; Notable for the following components:   Troponin I (High Sensitivity) 822 (*)    All other components within normal limits  CBC  PROTIME-INR  APTT  TROPONIN I (HIGH SENSITIVITY)   ____________________________________________  EKG  ED ECG REPORT I, Naaman Plummer, the attending physician, personally viewed and interpreted this ECG.  Date: 10/18/2021 EKG Time: 1848 Rate: 66 Rhythm: normal sinus rhythm QRS Axis: normal Intervals: RBBB ST/T Wave abnormalities: normal Narrative Interpretation: RBBB, no evidence of acute ischemia  ____________________________________________  RADIOLOGY  ED MD interpretation:  2 view Chest  x-ray shows bibasilar scarring and mild elevation of the right hemidiaphragm with no active disease  Official radiology report(s): DG Chest 2 View  Result Date: 10/18/2021 CLINICAL DATA:  Chest pain EXAM: CHEST - 2 VIEW COMPARISON:  01/17/2017 FINDINGS: Mild elevation of the right hemidiaphragm. Bibasilar scarring, stable. Heart is normal size. No effusions or acute bony abnormality. IMPRESSION: Bibasilar  scarring. Mild elevation of the right hemidiaphragm. No active disease. Electronically Signed   By: Rolm Baptise M.D.   On: 10/18/2021 19:10    ____________________________________________   PROCEDURES  Procedure(s) performed (including Critical Care):  .1-3 Lead EKG Interpretation Performed by: Naaman Plummer, MD Authorized by: Naaman Plummer, MD     Interpretation: abnormal     ECG rate:  66   ECG rate assessment: normal     Rhythm: sinus rhythm     Ectopy: none     Conduction: normal   Comments:     RBBB  CRITICAL CARE Performed by: Naaman Plummer  Total critical care time: 33 minutes  Critical care time was exclusive of separately billable procedures and treating other patients.  Critical care was necessary to treat or prevent imminent or life-threatening deterioration.  Critical care was time spent personally by me on the following activities: development of treatment plan with patient and/or surrogate as well as nursing, discussions with consultants, evaluation of patient's response to treatment, examination of patient, obtaining history from patient or surrogate, ordering and performing treatments and interventions, ordering and review of laboratory studies, ordering and review of radiographic studies, pulse oximetry and re-evaluation of patient's condition.  ____________________________________________   INITIAL IMPRESSION / ASSESSMENT AND PLAN / ED COURSE  As part of my medical decision making, I reviewed the following data within the electronic medical record,  if available:  Nursing notes reviewed and incorporated, Labs reviewed, EKG interpreted, Old chart reviewed, Radiograph reviewed and Notes from prior ED visits reviewed and incorporated        Workup: ECG, CXR, CBC, CMP, Troponin Findings: ECG: No overt evidence of STEMI. No evidence of Brugadas sign, delta wave, epsilon wave, significantly prolonged QTc, or malignant arrhythmia Troponin: 822 Other Labs unremarkable for emergent problems. CXR: Without PTX, PNA, or widened mediastinum  HEART Score: 6  Given History, Exam, and Workup concern for NSTEMI.  I have low suspicion for Pneumothorax, Pneumonia, Pulmonary Embolus, Tamponade, Aortic Dissection  Interventions: ASA 324or325mg  Heparin Bolus 60-70u/kg (max 5000) Heparin gtt about 12-15u/kg/hr (max 1000/hr) PRN analgesia with fentanyl, morphine PRN antiemetic therapy  Dispo: Admit      ____________________________________________   FINAL CLINICAL IMPRESSION(S) / ED DIAGNOSES  Final diagnoses:  Unstable angina pectoris (HCC)  NSTEMI (non-ST elevated myocardial infarction) [facility])     ED Discharge Orders     None        Note:  This document was prepared using Dragon voice recognition software and may include unintentional dictation errors.    Naaman Plummer, MD 10/18/21 2142

## 2021-10-18 NOTE — ED Notes (Signed)
Patient transported to X-ray 

## 2021-10-18 NOTE — H&P (Signed)
History and Physical    Gary Mueller:[id number] DOB: June 28, 1941 DOA: 10/18/2021  PCP: Jerrol Banana., MD   Patient coming from: home  I have personally briefly reviewed patient's old medical records in [place]  Chief Complaint: chest pain  HPI: Gary Mueller is a 80 y.o. male with medical history significant for PUD, HTN, hypothyroidism CAD with known 30% stenosis left main by cardiac cath December 2010 with a nonacute Myoview in 2018 who presents to the ED with a 1 week history of episodic exertional chest pain, retrosternal,  radiating down left arm relieved with rest..  At baseline patient is very active, playing golf and getting 10,000 steps daily however he has been increasingly fatigued with routine daily activities, since the onset of the first episode a week ago.  On the day of arrival, he had an episode of chest pain that was very severe and decided to come into the emergency room.  Patient had been doing well prior to this time and was last seen by his cardiologist on 10/03/21.  Patient denies associated nausea, vomiting or diaphoresis and denies cough, shortness of breath, palpitations or lightheadedness and denies fever or chills.  ED course: On arrival vitals within normal limits Blood work: Troponin 822> 819 and otherwise unremarkable  EKG, personally viewed and interpreted: Normal sinus rhythm at 66 with right bundle branch block  Imaging: Chest x-ray with bibasilar scarring.  Mild elevation of the right hemidiaphragm no active disease  Patient started on a heparin infusion given chewable aspirin and sublingual nitro.  Hospitalist consulted for admission.  Review of Systems: As per HPI otherwise all other systems on review of systems negative.    Past Medical History:  Diagnosis Date   Allergy    Anxiety    Arthritis    Cancer ([place]) 10/20/2016   Basal Cell Skin Cancer; lip, neck   Cystoid macular edema of left eye 07/07/2019   Depression     Dysrhythmia    A FIB   GERD (gastroesophageal reflux disease)    HOH (hard of hearing)    Bilateral   Hyperlipidemia    Hypertension    Patient denies   Hypothyroidism    IBS (irritable bowel syndrome)    Inguinal hernia    bilateral   Pneumonia    Thyroid disease    Tinnitus of both ears     Past Surgical History:  Procedure Laterality Date   ANTERIOR VITRECTOMY Left 11/28/2018   Procedure: ANTERIOR VITRECTOMY;  Surgeon: Marchia Meiers, MD;  Location: [facility] ORS;  Service: Ophthalmology;  Laterality: Left;   CARDIAC CATHETERIZATION     CATARACT EXTRACTION W/PHACO Right 10/10/2018   Procedure: CATARACT EXTRACTION PHACO AND INTRAOCULAR LENS PLACEMENT ([place]);  Surgeon: Marchia Meiers, MD;  Location: [facility];  Service: Ophthalmology;  Laterality: Right;  [place] 00:41 CDE 6.87 Fluid pack lot # [id number]   CATARACT EXTRACTION W/PHACO Left 11/28/2018   Procedure: CATARACT EXTRACTION PHACO AND INTRAOCULAR LENS PLACEMENT ([facility])-LEFT;  Surgeon: Marchia Meiers, MD;  Location: [facility];  Service: Ophthalmology;  Laterality: Left;  [place] 00:47.7 CDE 8.92 Fluid Pack lot # [id number]   CHOLECYSTECTOMY  1968   CHONDROPLASTY Right 01/02/2018   Procedure: CHONDROPLASTY;  Surgeon: Dereck Leep, MD;  Location: [facility];  Service: Orthopedics;  Laterality: Right;   COLONOSCOPY  2014   EYE SURGERY     HERNIA REPAIR Left 04/18/1996   inguinal hernia repair/ Dr Bary Castilla   HERNIA REPAIR Right 02/26/1996  Dr Bary Castilla   HERNIA REPAIR Left 08/07/2002   Dr Bary Castilla   JOINT REPLACEMENT     KNEE ARTHROPLASTY Right 04/30/2020   Procedure: COMPUTER ASSISTED TOTAL KNEE ARTHROPLASTY;  Surgeon: Dereck Leep, MD;  Location: [facility];  Service: Orthopedics;  Laterality: Right;   KNEE ARTHROSCOPY Right 01/02/2018   Procedure: ARTHROSCOPY KNEE;  Surgeon: Dereck Leep, MD;  Location: [facility];  Service: Orthopedics;  Laterality: Right;   KNEE ARTHROSCOPY Left 02/14/2012   partial menisectomy and chondroplasty   KNEE  ARTHROSCOPY WITH MEDIAL MENISECTOMY Right 01/02/2018   Procedure: KNEE ARTHROSCOPY WITH MEDIAL MENISECTOMY;  Surgeon: Dereck Leep, MD;  Location: [facility];  Service: Orthopedics;  Laterality: Right;   TONSILLECTOMY     as a child   TOTAL KNEE ARTHROPLASTY Left 03/29/2015   [facility] Dr. Marry Guan   VASECTOMY       reports that he quit smoking about 56 years ago. His smoking use included cigarettes. He smoked an average of 1.5 packs per day. He has never used smokeless tobacco. He reports current alcohol use. He reports that he does not use drugs.  Allergies  Allergen Reactions   Codeine     GI intolerance; hallucinations    Statins     Muscle pain    Family History  Problem Relation Age of Onset   Heart disease Mother        A fib   Healthy Sister       Prior to Admission medications   Medication Sig Start Date End Date Taking? Authorizing Provider  acetaminophen (TYLENOL) 500 MG tablet Take 1,000 mg by mouth daily as needed for moderate pain.    [provider]  amLODipine (NORVASC) 5 MG tablet TAKE ONE TABLET EVERY DAY 07/28/21   Jerrol Banana., MD  ascorbic acid (VITAMIN C) 1000 MG tablet Take 1 tablet by mouth daily.    [provider]  celecoxib (CELEBREX) 200 MG capsule Take 1 capsule (200 mg total) by mouth 2 (two) times daily. 05/02/20   Duanne Guess, PA-C  gabapentin (NEURONTIN) 100 MG capsule Take 1 capsule (100 mg total) by mouth 3 (three) times daily. 01/31/21   Jerrol Banana., MD  levothyroxine (SYNTHROID) 100 MCG tablet TAKE 1 TABLET EVERY DAY ON EMPTY STOMACHWITH A GLASS OF WATER AT LEAST 30-60 MINBEFORE BREAKFAST 11/23/20   Jerrol Banana., MD  Multiple Vitamin (MULTIVITAMIN WITH MINERALS) TABS tablet Take 1 tablet by mouth every evening.     [provider]  mupirocin nasal ointment (BACTROBAN NASAL) 2 % Place 1 application into the nose 2 (two) times daily. Use one-half of tube in each nostril twice daily for five (5)  days. After application, press sides of nose together and gently massage. 07/21/21   Jerrol Banana., MD  pantoprazole (PROTONIX) 40 MG tablet TAKE ONE TABLET (40 MG) BY MOUTH EVERY DAY 08/11/21   Jerrol Banana., MD  rosuvastatin (CRESTOR) 5 MG tablet TAKE ONE TABLET BY MOUTH EVERY DAY 05/03/20   Jerrol Banana., MD  traMADol (ULTRAM) 50 MG tablet Take 1-2 tablets (50-100 mg total) by mouth every 4 (four) hours as needed for moderate pain. 05/02/20   Duanne Guess, PA-C  vitamin E 180 MG (400 UNITS) capsule Take 1 capsule by mouth daily.    [provider]    Physical Exam: Vitals:   10/18/21 1851 10/18/21 2108 10/18/21 2133  BP: 128/73 (!) 148/70 134/72  Pulse: 68 61   Resp: 18 14 14   Temp: 98 F (36.7 C)    TempSrc: Oral    SpO2: 98% 96% 97%  Weight: 77.1 kg    Height: 6' (1.829 m)       Vitals:   10/18/21 1851 10/18/21 2108 10/18/21 2133  BP: 128/73 (!) 148/70 134/72  Pulse: 68 61   Resp: 18 14 14   Temp: 98 F (36.7 C)    TempSrc: Oral    SpO2: 98% 96% 97%  Weight: 77.1 kg    Height: 6' (1.829 m)        Constitutional: Alert and oriented x 3 . Not in any apparent distress HEENT:      Head: Normocephalic and atraumatic.         Eyes: PERLA, EOMI, Conjunctivae are normal. Sclera is non-icteric.       Mouth/Throat: Mucous membranes are moist.       Neck: Supple with no signs of meningismus. Cardiovascular: Regular rate and rhythm. No murmurs, gallops, or rubs. 2+ symmetrical distal pulses are present . No JVD. No LE edema Respiratory: Respiratory effort normal .Lungs sounds clear bilaterally. No wheezes, crackles, or rhonchi.  Gastrointestinal: Soft, non tender, and non distended with positive bowel sounds.  Genitourinary: No CVA tenderness. Musculoskeletal: Nontender with normal range of motion in all extremities. No cyanosis, or erythema of extremities. Neurologic:  Face is symmetric. Moving all extremities. No gross focal neurologic  deficits . Skin: Skin is warm, dry.  No rash or ulcers Psychiatric: Mood and affect are normal    Labs on Admission: I have personally reviewed following labs and imaging studies  CBC: Recent Labs  Lab 10/18/21 1846  WBC 9.3  HGB 14.2  HCT 41.2  MCV 94.9  PLT 258   Basic Metabolic Panel: Recent Labs  Lab 10/18/21 1846  NA 139  K 4.2  CL 106  CO2 25  GLUCOSE 147*  BUN 27*  CREATININE 0.94  CALCIUM 9.6   GFR: Estimated Creatinine Clearance: 67.2 mL/min (by C-G formula based on SCr of 0.94 mg/dL). Liver Function Tests: No results for input(s): AST, ALT, ALKPHOS, BILITOT, PROT, ALBUMIN in the last 168 hours. No results for input(s): LIPASE, AMYLASE in the last 168 hours. No results for input(s): AMMONIA in the last 168 hours. Coagulation Profile: Recent Labs  Lab 10/18/21 1846  INR 1.0   Cardiac Enzymes: No results for input(s): CKTOTAL, CKMB, CKMBINDEX, TROPONINI in the last 168 hours. BNP (last 3 results) No results for input(s): PROBNP in the last 8760 hours. HbA1C: No results for input(s): HGBA1C in the last 72 hours. CBG: No results for input(s): GLUCAP in the last 168 hours. Lipid Profile: No results for input(s): CHOL, HDL, LDLCALC, TRIG, CHOLHDL, LDLDIRECT in the last 72 hours. Thyroid Function Tests: No results for input(s): TSH, T4TOTAL, FREET4, T3FREE, THYROIDAB in the last 72 hours. Anemia Panel: No results for input(s): VITAMINB12, FOLATE, FERRITIN, TIBC, IRON, RETICCTPCT in the last 72 hours. Urine analysis:    Component Value Date/Time   COLORURINE YELLOW 04/22/2020 1226   APPEARANCEUR CLEAR 04/22/2020 1226   APPEARANCEUR CLEAR 03/17/2015 0928   LABSPEC >1.030 (H) 04/22/2020 1226   LABSPEC 1.016 03/17/2015 0928   PHURINE 6.0 04/22/2020 1226   GLUCOSEU NEGATIVE 04/22/2020 1226   GLUCOSEU NEGATIVE 03/17/2015 0928   HGBUR NEGATIVE 04/22/2020 1226   BILIRUBINUR NEGATIVE 04/22/2020 [place] 03/17/2015 [place] 04/22/2020 1226   PROTEINUR NEGATIVE 04/22/2020 1226  NITRITE NEGATIVE 04/22/2020 1226   LEUKOCYTESUR NEGATIVE 04/22/2020 1226   LEUKOCYTESUR NEGATIVE 03/17/2015 0928    Radiological Exams on Admission: DG Chest 2 View  Result Date: 10/18/2021 CLINICAL DATA:  Chest pain EXAM: CHEST - 2 VIEW COMPARISON:  01/17/2017 FINDINGS: Mild elevation of the right hemidiaphragm. Bibasilar scarring, stable. Heart is normal size. No effusions or acute bony abnormality. IMPRESSION: Bibasilar scarring. Mild elevation of the right hemidiaphragm. No active disease. Electronically Signed   By: Rolm Baptise M.D.   On: 10/18/2021 19:10     Assessment/Plan 80 year old male with history of PUD, HTN, hypothyroidism CAD with known 30% stenosis left main by cardiac cath December 2010 with a nonacute Myoview in 2018 presenting with 1 week of episodic exertional chest pain    NSTEMI/unstable angina   CAD in native artery -Patient with typical chest pain, EKG nonacute and troponin 822-819 - Last cath December 2010 with 30% stenosis left main and nonacute Myoview 2018 - Continue heparin infusion, aspirin. We will start Coreg. Statin allergy listed - Cardiology consult to evaluate for cath in the a.m. for further restratification    BP (high blood pressure) - Stable.  Continue home amlodipine    Adult hypothyroidism - Continue home levothyroxine  GERD - Continue home pantoprazole rosuvastatin    DVT prophylaxis: Heparin infusion Code Status: full code  Family Communication:  none  Disposition Plan: Back to previous home environment Consults called: Cardiology Status:At the time of admission, it appears that the appropriate admission status for this patient is INPATIENT. This is judged to be reasonable and necessary in order to provide the required intensity of service to ensure the patient's safety given the presenting symptoms, physical exam findings, and initial radiographic and laboratory data in  the context of their  Comorbid conditions.   Patient requires inpatient status due to high intensity of service, high risk for further deterioration and high frequency of surveillance required.   I certify that at the point of admission it is my clinical judgment that the patient will require inpatient hospital care spanning beyond [place] MD [facility]     10/18/2021, 10:26 PM

## 2021-10-19 ENCOUNTER — Other Ambulatory Visit: Payer: Self-pay

## 2021-10-19 ENCOUNTER — Encounter
Admission: EM | Disposition: A | Discharge status: Home / Self Care | Payer: Self-pay | Source: Home / Self Care | Attending: Internal Medicine

## 2021-10-19 DIAGNOSIS — I10 Essential (primary) hypertension: Secondary | ICD-10-CM

## 2021-10-19 DIAGNOSIS — E039 Hypothyroidism, unspecified: Secondary | ICD-10-CM

## 2021-10-19 DIAGNOSIS — K219 Gastro-esophageal reflux disease without esophagitis: Secondary | ICD-10-CM

## 2021-10-19 DIAGNOSIS — M069 Rheumatoid arthritis, unspecified: Secondary | ICD-10-CM

## 2021-10-19 HISTORY — PX: LEFT HEART CATH AND CORONARY ANGIOGRAPHY: CATH118249

## 2021-10-19 HISTORY — PX: CORONARY STENT INTERVENTION: CATH118234

## 2021-10-19 LAB — RESP PANEL BY RT-PCR (FLU A&B, COVID) ARPGX2
Influenza A by PCR: NEGATIVE
Influenza B by PCR: NEGATIVE
SARS Coronavirus 2 by RT PCR: NEGATIVE

## 2021-10-19 LAB — GLUCOSE, CAPILLARY: Glucose-Capillary: 137 mg/dL — ABNORMAL HIGH (ref 70–99)

## 2021-10-19 SURGERY — LEFT HEART CATH AND CORONARY ANGIOGRAPHY
Anesthesia: Moderate Sedation

## 2021-10-19 MED ORDER — HEPARIN (PORCINE) IN NACL 1000-0.9 UT/500ML-% IV SOLN
INTRAVENOUS | Status: DC | PRN
  Administered 2021-10-19: 500 mL
  Administered 2021-10-19: 1000 mL

## 2021-10-19 MED ORDER — MIDAZOLAM HCL 2 MG/2ML IJ SOLN
INTRAMUSCULAR | Status: DC | PRN
  Administered 2021-10-19 (×2): 1 mg via INTRAVENOUS

## 2021-10-19 MED ORDER — ASPIRIN 81 MG PO CHEW: 81.0000 mg | CHEWABLE_TABLET | Freq: Every day | ORAL | Status: DC

## 2021-10-19 MED ORDER — SODIUM CHLORIDE 0.9% FLUSH
3.0000 mL | Freq: Two times a day (BID) | INTRAVENOUS | Status: DC
  Administered 2021-10-20: 3 mL via INTRAVENOUS

## 2021-10-19 MED ORDER — FENTANYL CITRATE (PF) 100 MCG/2ML IJ SOLN
INTRAMUSCULAR | Status: AC
  Filled 2021-10-19: qty 2

## 2021-10-19 MED ORDER — NITROGLYCERIN 1 MG/10 ML FOR IR/CATH LAB
INTRA_ARTERIAL | Status: DC | PRN
  Administered 2021-10-19: 200 ug via INTRACORONARY

## 2021-10-19 MED ORDER — ASPIRIN 81 MG PO CHEW
CHEWABLE_TABLET | ORAL | Status: AC
  Filled 2021-10-19: qty 1

## 2021-10-19 MED ORDER — TICAGRELOR 90 MG PO TABS
90.0000 mg | ORAL_TABLET | Freq: Two times a day (BID) | ORAL | Status: DC
  Administered 2021-10-19 – 2021-10-20 (×2): 90 mg via ORAL
  Filled 2021-10-19 (×2): qty 1

## 2021-10-19 MED ORDER — TICAGRELOR 90 MG PO TABS
ORAL_TABLET | ORAL | Status: DC | PRN
  Administered 2021-10-19: 180 mg via ORAL

## 2021-10-19 MED ORDER — HYDRALAZINE HCL 20 MG/ML IJ SOLN: 10.0000 mg | INTRAMUSCULAR | Status: AC | PRN

## 2021-10-19 MED ORDER — FENTANYL CITRATE (PF) 100 MCG/2ML IJ SOLN
INTRAMUSCULAR | Status: DC | PRN
  Administered 2021-10-19 (×2): 25 ug via INTRAVENOUS

## 2021-10-19 MED ORDER — LIDOCAINE HCL 1 % IJ SOLN
INTRAMUSCULAR | Status: AC
  Filled 2021-10-19: qty 20

## 2021-10-19 MED ORDER — VERAPAMIL HCL 2.5 MG/ML IV SOLN
INTRAVENOUS | Status: AC
  Filled 2021-10-19: qty 2

## 2021-10-19 MED ORDER — MIDAZOLAM HCL 2 MG/2ML IJ SOLN
INTRAMUSCULAR | Status: AC
  Filled 2021-10-19: qty 2

## 2021-10-19 MED ORDER — SODIUM CHLORIDE 0.9 % WEIGHT BASED INFUSION
3.0000 mL/kg/h | INTRAVENOUS | Status: AC
  Administered 2021-10-19: 3 mL/kg/h via INTRAVENOUS

## 2021-10-19 MED ORDER — ASPIRIN EC 81 MG PO TBEC
81.0000 mg | DELAYED_RELEASE_TABLET | Freq: Every day | ORAL | Status: DC
  Administered 2021-10-20: 81 mg via ORAL
  Filled 2021-10-19: qty 1

## 2021-10-19 MED ORDER — ONDANSETRON HCL 4 MG/2ML IJ SOLN: 4.0000 mg | Freq: Four times a day (QID) | INTRAMUSCULAR | Status: DC | PRN

## 2021-10-19 MED ORDER — FOLIC ACID 1 MG PO TABS
1.0000 mg | ORAL_TABLET | Freq: Every day | ORAL | Status: DC
  Administered 2021-10-20: 1 mg via ORAL
  Filled 2021-10-19: qty 1

## 2021-10-19 MED ORDER — TICAGRELOR 90 MG PO TABS
ORAL_TABLET | ORAL | Status: AC
  Filled 2021-10-19: qty 2

## 2021-10-19 MED ORDER — SODIUM CHLORIDE 0.9 % WEIGHT BASED INFUSION: 1.0000 mL/kg/h | INTRAVENOUS | Status: DC

## 2021-10-19 MED ORDER — ROSUVASTATIN CALCIUM 10 MG PO TABS
10.0000 mg | ORAL_TABLET | Freq: Every day | ORAL | Status: DC
  Administered 2021-10-19 – 2021-10-20 (×2): 10 mg via ORAL
  Filled 2021-10-19 (×3): qty 1

## 2021-10-19 MED ORDER — VERAPAMIL HCL 2.5 MG/ML IV SOLN
INTRAVENOUS | Status: DC | PRN
  Administered 2021-10-19: 2.5 mg via INTRA_ARTERIAL

## 2021-10-19 MED ORDER — METOPROLOL SUCCINATE ER 25 MG PO TB24
12.5000 mg | ORAL_TABLET | Freq: Every day | ORAL | Status: DC
  Administered 2021-10-19: 12.5 mg via ORAL
  Filled 2021-10-19: qty 0.5
  Filled 2021-10-19: qty 1

## 2021-10-19 MED ORDER — SODIUM CHLORIDE 0.9% FLUSH: 3.0000 mL | INTRAVENOUS | Status: DC | PRN

## 2021-10-19 MED ORDER — PANTOPRAZOLE SODIUM 40 MG PO TBEC
40.0000 mg | DELAYED_RELEASE_TABLET | Freq: Every day | ORAL | Status: DC
  Administered 2021-10-20: 40 mg via ORAL
  Filled 2021-10-19: qty 1

## 2021-10-19 MED ORDER — IOHEXOL 350 MG/ML SOLN
INTRAVENOUS | Status: DC | PRN
  Administered 2021-10-19: 220 mL

## 2021-10-19 MED ORDER — ASPIRIN 81 MG PO CHEW
81.0000 mg | CHEWABLE_TABLET | ORAL | Status: AC
  Administered 2021-10-19: 81 mg via ORAL

## 2021-10-19 MED ORDER — SODIUM CHLORIDE 0.9% FLUSH: 3.0000 mL | Freq: Two times a day (BID) | INTRAVENOUS | Status: DC

## 2021-10-19 MED ORDER — SODIUM CHLORIDE 0.9 % IV SOLN: 250.0000 mL | INTRAVENOUS | Status: DC | PRN

## 2021-10-19 MED ORDER — HEPARIN (PORCINE) IN NACL 1000-0.9 UT/500ML-% IV SOLN
INTRAVENOUS | Status: AC
  Filled 2021-10-19: qty 1000

## 2021-10-19 MED ORDER — LEVOTHYROXINE SODIUM 100 MCG PO TABS
100.0000 ug | ORAL_TABLET | Freq: Every day | ORAL | Status: DC
  Administered 2021-10-20: 100 ug via ORAL
  Filled 2021-10-19: qty 1

## 2021-10-19 MED ORDER — LABETALOL HCL 5 MG/ML IV SOLN: 10.0000 mg | INTRAVENOUS | Status: AC | PRN

## 2021-10-19 MED ORDER — HEPARIN SODIUM (PORCINE) 1000 UNIT/ML IJ SOLN
INTRAMUSCULAR | Status: AC
  Filled 2021-10-19: qty 1

## 2021-10-19 MED ORDER — HEPARIN SODIUM (PORCINE) 1000 UNIT/ML IJ SOLN
INTRAMUSCULAR | Status: DC | PRN
  Administered 2021-10-19: 4000 [IU] via INTRAVENOUS
  Administered 2021-10-19: 6000 [IU] via INTRAVENOUS

## 2021-10-19 MED ORDER — ACETAMINOPHEN 325 MG PO TABS
ORAL_TABLET | ORAL | Status: AC
  Filled 2021-10-19: qty 2

## 2021-10-19 MED ORDER — LIDOCAINE HCL (PF) 1 % IJ SOLN
INTRAMUSCULAR | Status: DC | PRN
  Administered 2021-10-19: 3 mL via SUBCUTANEOUS

## 2021-10-19 MED ORDER — ACETAMINOPHEN 325 MG PO TABS
650.0000 mg | ORAL_TABLET | ORAL | Status: DC | PRN
  Administered 2021-10-19 (×2): 650 mg via ORAL
  Filled 2021-10-19: qty 2

## 2021-10-19 MED ORDER — SODIUM CHLORIDE 0.9 % WEIGHT BASED INFUSION: 1.0000 mL/kg/h | INTRAVENOUS | Status: AC

## 2021-10-19 SURGICAL SUPPLY — 19 items
BALLN EUPHORA RX 2.0X15 (BALLOONS) ×2
BALLOON EUPHORA RX 2.0X15 (BALLOONS) IMPLANT
CATH INFINITI 5 FR JL3.5 (CATHETERS) ×1 IMPLANT
CATH INFINITI 5FR ANG PIGTAIL (CATHETERS) ×1 IMPLANT
CATH INFINITI JR4 5F (CATHETERS) ×1 IMPLANT
CATH VISTA GUIDE 6FR XB3 (CATHETERS) ×1 IMPLANT
DEVICE RAD TR BAND REGULAR (VASCULAR PRODUCTS) ×1 IMPLANT
DRAPE BRACHIAL (DRAPES) ×1 IMPLANT
GLIDESHEATH SLEND SS 6F .021 (SHEATH) ×1 IMPLANT
KIT ENCORE 26 ADVANTAGE (KITS) ×1 IMPLANT
MARKER SKIN DUAL TIP RULER LAB (MISCELLANEOUS) ×1 IMPLANT
PACK CARDIAC CATH (CUSTOM PROCEDURE TRAY) ×2 IMPLANT
PROTECTION STATION PRESSURIZED (MISCELLANEOUS) ×2
SET ATX SIMPLICITY (MISCELLANEOUS) ×1 IMPLANT
STATION PROTECTION PRESSURIZED (MISCELLANEOUS) IMPLANT
STENT ONYX FRONTIER 2.5X22 (Permanent Stent) ×1 IMPLANT
TUBING CIL FLEX 10 FLL-RA (TUBING) ×1 IMPLANT
WIRE ASAHI PROWATER 180CM (WIRE) ×1 IMPLANT
WIRE HI TORQ VERSACORE J 260CM (WIRE) ×1 IMPLANT

## 2021-10-19 NOTE — Consult Note (Signed)
[facility]  CARDIOLOGY CONSULT NOTE  Patient ID: Gary Mueller MRN: [id number] DOB/AGE: [birth year] 80 y.o.  Admit date: 10/18/2021 Referring Physician [place] Primary Physician Rosanna Randy Primary Cardiologist Leonce Bale Reason for Consultation non-ST elevation myocardial infarction  HPI: 80 year old gentleman referred for evaluation of non-ST elevation myocardial infarction.  The patient was in his usual state of health, recently seen in the office 10/03/2021 at which time he was doing well.  He presents with a 1 week history of intermittent mid epigastric and substernal chest discomfort with radiation down his left arm.  These episodes have occurred both with exertion and at rest.  On arrival to the ED, ECG revealed sinus rhythm at 66 bpm with right bundle branch block without acute ischemic ST-T wave changes.  Chest x-ray did not reveal any acute cardiopulmonary disease.  Admission labs notable for elevated troponin of 822 and 819 without significant delta.  Patient currently on heparin drip and chest pain-free.  Of note, patient underwent prior cardiac catheterization 12/01/2009 which revealed insignificant coronary artery disease with 30% stenosis left main.  Review of systems complete and found to be negative unless listed above     Past Medical History:  Diagnosis Date   Allergy    Anxiety    Arthritis    Cancer ([place]) 10/20/2016   Basal Cell Skin Cancer; lip, neck   Cystoid macular edema of left eye 07/07/2019   Depression    Dysrhythmia    A FIB   GERD (gastroesophageal reflux disease)    HOH (hard of hearing)    Bilateral   Hyperlipidemia    Hypertension    Patient denies   Hypothyroidism    IBS (irritable bowel syndrome)    Inguinal hernia    bilateral   Pneumonia    Thyroid disease    Tinnitus of both ears     Past Surgical History:  Procedure Laterality Date   ANTERIOR VITRECTOMY Left 11/28/2018   Procedure: ANTERIOR VITRECTOMY;  Surgeon: Marchia Meiers, MD;   Location: [facility] OR[facility];  Service: Ophthalmology;  Laterality: Left;   CARDIAC CATHETERIZATION     CATARACT EXTRACTION W/PHACO Right 10/10/2018   Procedure: CATARACT EXTRACTION PHACO AND INTRAOCULAR LENS PLACEMENT ([place]);  Surgeon: Marchia Meiers, MD;  Location: [facility];  Service: Ophthalmology;  Laterality: Right;  [place] 00:41 CDE 6.87 Fluid pack lot # [id number]   CATARACT EXTRACTION W/PHACO Left 11/28/2018   Procedure: CATARACT EXTRACTION PHACO AND INTRAOCULAR LENS PLACEMENT ([facility])-LEFT;  Surgeon: Marchia Meiers, MD;  Location: [facility];  Service: Ophthalmology;  Laterality: Left;  [place] 00:47.7 CDE 8.92 Fluid Pack lot # [id number]   CHOLECYSTECTOMY  1968   CHONDROPLASTY Right 01/02/2018   Procedure: CHONDROPLASTY;  Surgeon: Dereck Leep, MD;  Location: [facility];  Service: Orthopedics;  Laterality: Right;   COLONOSCOPY  2014   EYE SURGERY     HERNIA REPAIR Left 04/18/1996   inguinal hernia repair/ Dr Bary Castilla   HERNIA REPAIR Right 02/26/1996   Dr Bary Castilla   HERNIA REPAIR Left 08/07/2002   Dr Bary Castilla   JOINT REPLACEMENT     KNEE ARTHROPLASTY Right 04/30/2020   Procedure: COMPUTER ASSISTED TOTAL KNEE ARTHROPLASTY;  Surgeon: Dereck Leep, MD;  Location: [facility];  Service: Orthopedics;  Laterality: Right;   KNEE ARTHROSCOPY Right 01/02/2018   Procedure: ARTHROSCOPY KNEE;  Surgeon: Dereck Leep, MD;  Location: [facility];  Service: Orthopedics;  Laterality: Right;   KNEE ARTHROSCOPY Left 02/14/2012   partial menisectomy and chondroplasty   KNEE ARTHROSCOPY WITH MEDIAL  MENISECTOMY Right 01/02/2018   Procedure: KNEE ARTHROSCOPY WITH MEDIAL MENISECTOMY;  Surgeon: Dereck Leep, MD;  Location: [facility];  Service: Orthopedics;  Laterality: Right;   TONSILLECTOMY     as a child   TOTAL KNEE ARTHROPLASTY Left 03/29/2015   [facility] Dr. Marry Guan   VASECTOMY      (Not in a hospital admission)  Social History   Socioeconomic History   Marital status: Widowed    Spouse name: Lelon Frohlich   Number of children: 3   Years  of education: Not on file   Highest education level: Some college, no degree  Occupational History   Occupation: retired  Tobacco Use   Smoking status: Former    Packs/day: 1.50    Types: Cigarettes    Quit date: 12/24/1964    Years since quitting: 56.8   Smokeless tobacco: Never  Vaping Use   Vaping Use: Never used  Substance and Sexual Activity   Alcohol use: Yes    Comment: 0-1 beer or glass of wine monthly   Drug use: No   Sexual activity: Not on file  Other Topics Concern   Not on file  Social History Narrative   Not on file   Social Determinants of Health   Financial Resource Strain: Not on file  Food Insecurity: Not on file  Transportation Needs: Not on file  Physical Activity: Not on file  Stress: Not on file  Social Connections: Not on file  Intimate Partner Violence: Not on file    Family History  Problem Relation Age of Onset   Heart disease Mother        A fib   Healthy Sister       Review of systems complete and found to be negative unless listed above      PHYSICAL EXAM  General: Well developed, well nourished, in no acute distress HEENT:  Normocephalic and atramatic Neck:  No JVD.  Lungs: Clear bilaterally to auscultation and percussion. Heart: HRRR . Normal S1 and S2 without gallops or murmurs.  Abdomen: Bowel sounds are positive, abdomen soft and non-tender  Msk:  Back normal, normal gait. Normal strength and tone for age. Extremities: No clubbing, cyanosis or edema.   Neuro: Alert and oriented X 3. Psych:  Good affect, responds appropriately  Labs:   Lab Results  Component Value Date   WBC 9.3 10/18/2021   HGB 14.2 10/18/2021   HCT 41.2 10/18/2021   MCV 94.9 10/18/2021   PLT 200 10/18/2021    Recent Labs  Lab 10/18/21 1846  NA 139  K 4.2  CL 106  CO2 25  BUN 27*  CREATININE 0.94  CALCIUM 9.6  GLUCOSE 147*   No results found for: CKTOTAL, CKMB, CKMBINDEX, TROPONINI  Lab Results  Component Value Date   CHOL 201 (H)  02/01/2021   CHOL 193 01/28/2020   CHOL 150 06/16/2019   Lab Results  Component Value Date   HDL 40 02/01/2021   HDL 40 01/28/2020   HDL 49 06/16/2019   Lab Results  Component Value Date   LDLCALC 139 (H) 02/01/2021   LDLCALC 134 (H) 01/28/2020   LDLCALC 81 06/16/2019   Lab Results  Component Value Date   TRIG 119 02/01/2021   TRIG 103 01/28/2020   TRIG 101 06/16/2019   Lab Results  Component Value Date   CHOLHDL 5.0 02/01/2021   CHOLHDL 4.8 01/28/2020   CHOLHDL 3.1 06/16/2019   No results found for: LDLDIRECT    Radiology: DG  Chest 2 View  Result Date: 10/18/2021 CLINICAL DATA:  Chest pain EXAM: CHEST - 2 VIEW COMPARISON:  01/17/2017 FINDINGS: Mild elevation of the right hemidiaphragm. Bibasilar scarring, stable. Heart is normal size. No effusions or acute bony abnormality. IMPRESSION: Bibasilar scarring. Mild elevation of the right hemidiaphragm. No active disease. Electronically Signed   By: Rolm Baptise M.D.   On: 10/18/2021 19:10    EKG: Sinus rhythm 66 bpm with right bundle branch block  ASSESSMENT AND PLAN:   1.  Non-ST elevation myocardial infarction, with nondiagnostic ECG, elevated troponin (822, 819), currently chest pain-free on heparin drip 2.  Insignificant CAD by prior cardiac catheterization 12/01/2009 3.  Symptomatic PVCs  Recommendations  1.  Agree with current therapy 2.  Proceed with cardiac catheterization with selective coronary arteriography.  The risk, benefits and alternatives of cardiac catheterization and possible PCI were explained to the patient and informed written consent was obtained.  Signed: Isaias Cowman MD,PhD, [facility] 10/19/2021, 8:28 AM

## 2021-10-19 NOTE — Progress Notes (Signed)
Patient ID: Gary Mueller, male   DOB: [birth year], 80 y.o.   MRN: [id number] [facility] PROGRESS NOTE  Gary Mueller YBO:[id number] DOB: [birth year] DOA: 10/18/2021 PCP: Jerrol Banana., MD  HPI/Subjective: Patient seen this morning prior to cardiac catheterization.  States his chest pain is more like an ache at this point and not as severe as yesterday.  Patient has occasional radiation to the neck and left shoulder.  No sweating but did feel cool.  No nausea or vomiting.  Admitted with NSTEMI.  Objective: Vitals:   10/19/21 1130 10/19/21 1240  BP: (!) 157/72 134/60  Pulse: (!) 59 (!) 56  Resp: 18 14  Temp:    SpO2: 98% 99%    Intake/Output Summary (Last 24 hours) at 10/19/2021 1250 Last data filed at 10/19/2021 1225 Gross per 24 hour  Intake --  Output 525 ml  Net -525 ml   Filed Weights   10/18/21 1851  Weight: 77.1 kg    ROS: Review of Systems  Respiratory:  Negative for cough and shortness of breath.   Cardiovascular:  Positive for chest pain.  Gastrointestinal:  Negative for abdominal pain, nausea and vomiting.  Exam: Physical Exam HENT:     Head: Normocephalic.     Mouth/Throat:     Pharynx: No oropharyngeal exudate.  Eyes:     General: Lids are normal.     Conjunctiva/sclera: Conjunctivae normal.  Cardiovascular:     Rate and Rhythm: Normal rate and regular rhythm.     Heart sounds: Normal heart sounds, S1 normal and S2 normal.  Pulmonary:     Breath sounds: No decreased breath sounds, wheezing, rhonchi or rales.  Abdominal:     Palpations: Abdomen is soft.     Tenderness: There is no abdominal tenderness.  Musculoskeletal:     Right lower leg: No swelling.     Left lower leg: No swelling.  Skin:    General: Skin is warm.     Findings: No rash.  Neurological:     Mental Status: He is alert and oriented to person, place, and time.      Scheduled Meds:  aspirin       [START ON 10/20/2021] aspirin EC  81 mg Oral Daily   [MAR Hold]  carvedilol  3.125 mg Oral BID WC   metoprolol succinate  12.5 mg Oral Daily   rosuvastatin  10 mg Oral Daily   sodium chloride flush  3 mL Intravenous Q12H   ticagrelor  90 mg Oral BID   Continuous Infusions:  sodium chloride     sodium chloride     sodium chloride     heparin Stopped (10/19/21 0905)    Assessment/Plan:  NSTEMI.  Patient brought to the cardiac Cath Lab today and had a successful PCI with DES to proximal left circumflex by Dr. Lorinda Creed.  Patient currently on aspirin, Brilinta, Crestor and Coreg.  The patient was on heparin drip this morning. Essential hypertension Norvasc switched over to Coreg Hypothyroidism unspecified on levothyroxine GERD on Protonix History of rheumatoid arthritis hold methotrexate currently continue folic acid      Code Status:     Code Status Orders  (From admission, onward)           Start     Ordered   10/18/21 2241  Full code  Continuous        10/18/21 2241           Code Status History  Date Active Date Inactive Code Status Order ID Comments User Context   04/30/2020 1313 05/02/2020 1614 Full Code [id number]  Dereck Leep, MD Inpatient      Advance Directive Documentation    [place] Most Recent Value  Type of Advance Directive Living will  Pre-existing out of facility DNR order (yellow form or pink MOST form) --  "MOST" Form in Place? --      Family Communication: Spoke with daughter at the bedside Disposition Plan: Status is: Inpatient  Consultants: Cardiology  Procedures: Cardiac catheterization  Time spent: 27 minutes  [place]

## 2021-10-19 NOTE — ED Notes (Signed)
Pt sleeping NADN 

## 2021-10-20 ENCOUNTER — Other Ambulatory Visit: Payer: Self-pay

## 2021-10-20 ENCOUNTER — Encounter: Payer: Self-pay | Admitting: Cardiology

## 2021-10-20 ENCOUNTER — Telehealth: Payer: Self-pay

## 2021-10-20 DIAGNOSIS — R001 Bradycardia, unspecified: Secondary | ICD-10-CM

## 2021-10-20 DIAGNOSIS — I200 Unstable angina: Secondary | ICD-10-CM

## 2021-10-20 LAB — CBC
HCT: 35.1 % — ABNORMAL LOW (ref 39.0–52.0)
Hemoglobin: 12.2 g/dL — ABNORMAL LOW (ref 13.0–17.0)
MCH: 34.2 pg — ABNORMAL HIGH (ref 26.0–34.0)
MCHC: 34.8 g/dL (ref 30.0–36.0)
MCV: 98.3 fL (ref 80.0–100.0)
Platelets: 172 10*3/uL (ref 150–400)
RBC: 3.57 MIL/uL — ABNORMAL LOW (ref 4.22–5.81)
RDW: 13.2 % (ref 11.5–15.5)
WBC: 7.3 10*3/uL (ref 4.0–10.5)
nRBC: 0 % (ref 0.0–0.2)

## 2021-10-20 LAB — BASIC METABOLIC PANEL
Anion gap: 7 (ref 5–15)
BUN: 24 mg/dL — ABNORMAL HIGH (ref 8–23)
CO2: 26 mmol/L (ref 22–32)
Calcium: 8.9 mg/dL (ref 8.9–10.3)
Chloride: 106 mmol/L (ref 98–111)
Creatinine, Ser: 0.87 mg/dL (ref 0.61–1.24)
GFR, Estimated: >60 mL/min (ref >60)
Glucose, Bld: 109 mg/dL — ABNORMAL HIGH (ref 70–99)
Potassium: 4.5 mmol/L (ref 3.5–5.1)
Sodium: 139 mmol/L (ref 135–145)

## 2021-10-20 LAB — POCT ACTIVATED CLOTTING TIME: Activated Clotting Time: 358 seconds

## 2021-10-20 MED ORDER — METOPROLOL SUCCINATE ER 25 MG PO TB24: 12.5000 mg | ORAL_TABLET | Freq: Every evening | ORAL | Status: DC

## 2021-10-20 MED ORDER — LISINOPRIL 5 MG PO TABS: 5.0000 mg | ORAL_TABLET | Freq: Every day | ORAL | Status: DC

## 2021-10-20 MED ORDER — ROSUVASTATIN CALCIUM 10 MG PO TABS: 10.0000 mg | ORAL_TABLET | Freq: Every day | ORAL | 0 refills | Status: DC

## 2021-10-20 MED ORDER — NITROGLYCERIN 0.4 MG SL SUBL: 0.4000 mg | SUBLINGUAL_TABLET | SUBLINGUAL | 0 refills | Status: DC | PRN

## 2021-10-20 MED ORDER — ASPIRIN 81 MG PO TBEC: 81.0000 mg | DELAYED_RELEASE_TABLET | Freq: Every day | ORAL | 0 refills | Status: DC

## 2021-10-20 MED ORDER — METOPROLOL SUCCINATE ER 25 MG PO TB24: 12.5000 mg | ORAL_TABLET | Freq: Every evening | ORAL | 0 refills | Status: DC

## 2021-10-20 MED ORDER — TICAGRELOR 90 MG PO TABS: 90.0000 mg | ORAL_TABLET | Freq: Two times a day (BID) | ORAL | 0 refills | Status: DC

## 2021-10-20 MED ORDER — LISINOPRIL 5 MG PO TABS
5.0000 mg | ORAL_TABLET | Freq: Every day | ORAL | 0 refills | Status: DC
Start: 2021-10-20 — End: 2022-01-30

## 2021-10-20 NOTE — Progress Notes (Signed)
Gary Mueller to be D/C'd Home per MD order.  Discussed prescriptions and follow up appointments with the patient. Prescriptions electronically submitted medication list explained in detail. Pt verbalized understanding. Stent card was already given to patient. Brilinta coupon given to patient. Dressing to right radial site clean dry and intact.  Allergies as of 10/20/2021       Reactions   Codeine    GI intolerance; hallucinations    Statins    Muscle pain        Medication List     STOP taking these medications    amLODipine 5 MG tablet Commonly known as: NORVASC   Bactroban Nasal 2 % Generic drug: mupirocin nasal ointment       TAKE these medications    acetaminophen 500 MG tablet Commonly known as: TYLENOL Take 1,000 mg by mouth daily as needed for moderate pain.   ascorbic acid 1000 MG tablet Commonly known as: VITAMIN C Take 1 tablet by mouth daily.   aspirin 81 MG EC tablet Take 1 tablet (81 mg total) by mouth daily. Swallow whole.   folic acid 1 MG tablet Commonly known as: FOLVITE Take 1 mg by mouth daily.   levothyroxine 100 MCG tablet Commonly known as: SYNTHROID TAKE 1 TABLET EVERY DAY ON EMPTY STOMACHWITH A GLASS OF WATER AT LEAST 30-60 MINBEFORE BREAKFAST   lisinopril 5 MG tablet Commonly known as: ZESTRIL Take 1 tablet (5 mg total) by mouth daily.   methotrexate 2.5 MG tablet Take 15 mg by mouth every Wednesday.   metoprolol succinate 25 MG 24 hr tablet Commonly known as: TOPROL-XL Take 0.5 tablets (12.5 mg total) by mouth every evening.   multivitamin with minerals Tabs tablet Take 1 tablet by mouth every evening.   nitroGLYCERIN 0.4 MG SL tablet Commonly known as: NITROSTAT Place 1 tablet (0.4 mg total) under the tongue every 5 (five) minutes as needed for chest pain.   pantoprazole 40 MG tablet Commonly known as: PROTONIX TAKE ONE TABLET (40 MG) BY MOUTH EVERY DAY   rosuvastatin 10 MG tablet Commonly known as: CRESTOR Take 1  tablet (10 mg total) by mouth daily. What changed:  medication strength how much to take   ticagrelor 90 MG Tabs tablet Commonly known as: BRILINTA Take 1 tablet (90 mg total) by mouth 2 (two) times daily.   vitamin E 180 MG (400 UNITS) capsule Take 1 capsule by mouth daily.        Vitals:   10/20/21 0503 10/20/21 0758  BP: (!) 120/55 126/60  Pulse: (!) 54 (!) 52  Resp: 16   Temp: 97.8 F (36.6 C) 97.6 F (36.4 C)  SpO2: 94% 96%    Skin clean, dry and intact without evidence of skin break down, no evidence of skin tears noted. IV catheter discontinued intact. Site without signs and symptoms of complications. Dressing and pressure applied. Pt denies pain at this time. No complaints noted.  An After Visit Summary was printed and given to the patient. Patient escorted via [place], and D/C home via private auto.  [place]

## 2021-10-20 NOTE — Discharge Summary (Signed)
[place] [facility] [place] NAME: Gary Mueller    MR#:  [id number]  DATE OF BIRTH:  [birth year]  DATE OF ADMISSION:  10/18/2021 ADMITTING PHYSICIAN: Athena Masse, MD  DATE OF DISCHARGE: 10/20/2021  1:00 PM  PRIMARY CARE PHYSICIAN: Jerrol Banana., MD    ADMISSION DIAGNOSIS:  Unstable angina pectoris ([place]) [I20.0] NSTEMI (non-ST elevated myocardial infarction) ([place]) [I21.4]  DISCHARGE DIAGNOSIS:  Active Problems:   CAD in native artery   Essential hypertension   Hypothyroidism   NSTEMI (non-ST elevated myocardial infarction) ([place])   Gastroesophageal reflux disease without esophagitis   Rheumatoid arthritis ([place])   SECONDARY DIAGNOSIS:   Past Medical History:  Diagnosis Date   Allergy    Anxiety    Arthritis    Cancer ([place]) 10/20/2016   Basal Cell Skin Cancer; lip, neck   Cystoid macular edema of left eye 07/07/2019   Depression    Dysrhythmia    A FIB   GERD (gastroesophageal reflux disease)    HOH (hard of hearing)    Bilateral   Hyperlipidemia    Hypertension    Patient denies   Hypothyroidism    IBS (irritable bowel syndrome)    Inguinal hernia    bilateral   Pneumonia    Thyroid disease    Tinnitus of both ears     HOSPITAL COURSE:   1.  NSTEMI.  Patient was admitted with chest pain and started on heparin drip.  Troponin elevated 822 and 819.  Patient was given aspirin Crestor and initially Coreg.  Dr. Lorinda Creed took to the cardiac Cath Lab for PCI with DES to the proximal left circumflex artery.  The patient was feeling well on the day of discharge without chest pain or shortness of breath.  Discharge medications include aspirin, Brilinta, Toprol-XL Crestor and low-dose lisinopril.  Cardiac rehab referral. 2.  Bruising right arm secondary to cardiac catheterization. 3.  Essential hypertension.  Discontinue Norvasc.  Started on low-dose lisinopril and low-dose Toprol-XL.  Heart rate in the upper 50s upon  discharge. 4.  Hypothyroidism unspecified on levothyroxine 5.  GERD on Protonix 6.  History of rheumatoid arthritis.  Can restart methotrexate as outpatient and continue folic acid.  DISCHARGE CONDITIONS:   Satisfactory  CONSULTS OBTAINED:    Cardiology  DRUG ALLERGIES:   Allergies  Allergen Reactions   Codeine     GI intolerance; hallucinations    Statins     Muscle pain    DISCHARGE MEDICATIONS:   Allergies as of 10/20/2021       Reactions   Codeine    GI intolerance; hallucinations    Statins    Muscle pain        Medication List     STOP taking these medications    amLODipine 5 MG tablet Commonly known as: NORVASC   Bactroban Nasal 2 % Generic drug: mupirocin nasal ointment       TAKE these medications    acetaminophen 500 MG tablet Commonly known as: TYLENOL Take 1,000 mg by mouth daily as needed for moderate pain.   ascorbic acid 1000 MG tablet Commonly known as: VITAMIN C Take 1 tablet by mouth daily.   aspirin 81 MG EC tablet Take 1 tablet (81 mg total) by mouth daily. Swallow whole.   folic acid 1 MG tablet Commonly known as: FOLVITE Take 1 mg by mouth daily.   levothyroxine 100 MCG tablet Commonly known as: SYNTHROID TAKE 1 TABLET EVERY DAY  ON EMPTY STOMACHWITH A GLASS OF WATER AT LEAST 30-60 MINBEFORE BREAKFAST   lisinopril 5 MG tablet Commonly known as: ZESTRIL Take 1 tablet (5 mg total) by mouth daily.   methotrexate 2.5 MG tablet Take 15 mg by mouth every Wednesday.   metoprolol succinate 25 MG 24 hr tablet Commonly known as: TOPROL-XL Take 0.5 tablets (12.5 mg total) by mouth every evening.   multivitamin with minerals Tabs tablet Take 1 tablet by mouth every evening.   nitroGLYCERIN 0.4 MG SL tablet Commonly known as: NITROSTAT Place 1 tablet (0.4 mg total) under the tongue every 5 (five) minutes as needed for chest pain.   pantoprazole 40 MG tablet Commonly known as: PROTONIX TAKE ONE TABLET (40 MG) BY MOUTH  EVERY DAY   rosuvastatin 10 MG tablet Commonly known as: CRESTOR Take 1 tablet (10 mg total) by mouth daily. What changed:  medication strength how much to take   ticagrelor 90 MG Tabs tablet Commonly known as: BRILINTA Take 1 tablet (90 mg total) by mouth 2 (two) times daily.   vitamin E 180 MG (400 UNITS) capsule Take 1 capsule by mouth daily.         DISCHARGE INSTRUCTIONS:   Follow-up PMD 5 days Follow-up cardiology 1 week Cardiac rehab  If you experience worsening of your admission symptoms, develop shortness of breath, life threatening emergency, suicidal or homicidal thoughts you must seek medical attention immediately by calling 911 or calling your MD immediately  if symptoms less severe.  You Must read complete instructions/literature along with all the possible adverse reactions/side effects for all the Medicines you take and that have been prescribed to you. Take any new Medicines after you have completely understood and accept all the possible adverse reactions/side effects.   Please note  You were cared for by a hospitalist during your hospital stay. If you have any questions about your discharge medications or the care you received while you were in the hospital after you are discharged, you can call the unit and asked to speak with the hospitalist on call if the hospitalist that took care of you is not available. Once you are discharged, your primary care physician will handle any further medical issues. Please note that NO REFILLS for any discharge medications will be authorized once you are discharged, as it is imperative that you return to your primary care physician (or establish a relationship with a primary care physician if you do not have one) for your aftercare needs so that they can reassess your need for medications and monitor your lab values.    Today   CHIEF COMPLAINT:   Chief Complaint  Patient presents with   Chest Pain    HISTORY OF  PRESENT ILLNESS:  Gary Mueller  is a 80 y.o. male with a known history of admitted with chest pain found to have an NSTEMI   VITAL SIGNS:  Blood pressure 126/60, pulse (!) 52, temperature 97.6 F (36.4 C), temperature source Oral, resp. rate 16, height 6' (1.829 m), weight 77.1 kg, SpO2 96 %.  I/O:   Intake/Output Summary (Last 24 hours) at 10/20/2021 1613 Last data filed at 10/20/2021 0950 Gross per 24 hour  Intake 240 ml  Output 175 ml  Net 65 ml    PHYSICAL EXAMINATION:  GENERAL:  80 y.o.-year-old patient lying in the bed with no acute distress.  EYES: Pupils equal, round, reactive to light and accommodation. No scleral icterus. Extraocular muscles intact.  HEENT: Head atraumatic, normocephalic. Oropharynx and  nasopharynx clear.   LUNGS: Normal breath sounds bilaterally, no wheezing, rales,rhonchi or crepitation. No use of accessory muscles of respiration.  CARDIOVASCULAR: S1, S2 normal. No murmurs, rubs, or gallops.  ABDOMEN: Soft, non-tender, non-distended.  EXTREMITIES: No pedal edema, cyanosis, or clubbing.  NEUROLOGIC: Cranial nerves II through XII are intact. Muscle strength 5/5 in all extremities. Sensation intact. Gait not checked.  PSYCHIATRIC: The patient is alert and oriented x 3.  SKIN: No obvious rash, lesion, or ulcer.  Bruising right arm.  DATA REVIEW:   CBC Recent Labs  Lab 10/20/21 0436  WBC 7.3  HGB 12.2*  HCT 35.1*  PLT 172    Chemistries  Recent Labs  Lab 10/20/21 0436  NA 139  K 4.5  CL 106  CO2 26  GLUCOSE 109*  BUN 24*  CREATININE 0.87  CALCIUM 8.9     Microbiology Results  Results for orders placed or performed during the hospital encounter of 10/18/21  Resp Panel by RT-PCR (Flu A&B, Covid) Nasopharyngeal Swab     Status: None   Collection Time: 10/19/21  6:13 AM   Specimen: Nasopharyngeal Swab; Nasopharyngeal(NP) swabs in vial transport medium  Result Value Ref Range Status   SARS Coronavirus 2 by RT PCR NEGATIVE NEGATIVE  Final    Comment: (NOTE) SARS-CoV-2 target nucleic acids are NOT DETECTED.  The SARS-CoV-2 RNA is generally detectable in upper respiratory specimens during the acute phase of infection. The lowest concentration of SARS-CoV-2 viral copies this assay can detect is 138 copies/mL. A negative result does not preclude SARS-Cov-2 infection and should not be used as the sole basis for treatment or other patient management decisions. A negative result may occur with  improper specimen collection/handling, submission of specimen other than nasopharyngeal swab, presence of viral mutation(s) within the areas targeted by this assay, and inadequate number of viral copies(<138 copies/mL). A negative result must be combined with clinical observations, patient history, and epidemiological information. The expected result is Negative.  Fact Sheet for Patients:  EntrepreneurPulse.com.au  Fact Sheet for Healthcare Providers:  IncredibleEmployment.be  This test is no t yet approved or cleared by the [place] FDA and  has been authorized for detection and/or diagnosis of SARS-CoV-2 by FDA under an Emergency Use Authorization (EUA). This EUA will remain  in effect (meaning this test can be used) for the duration of the COVID-19 declaration under Section 564(b)(1) of the Act, 21 U.S.C.section 360bbb-3(b)(1), unless the authorization is terminated  or revoked sooner.       Influenza A by PCR NEGATIVE NEGATIVE Final   Influenza B by PCR NEGATIVE NEGATIVE Final    Comment: (NOTE) The Xpert Xpress SARS-CoV-2/FLU/RSV plus assay is intended as an aid in the diagnosis of influenza from Nasopharyngeal swab specimens and should not be used as a sole basis for treatment. Nasal washings and aspirates are unacceptable for Xpert Xpress SARS-CoV-2/FLU/RSV testing.  Fact Sheet for Patients: EntrepreneurPulse.com.au  Fact Sheet for Healthcare  Providers: IncredibleEmployment.be  This test is not yet approved or cleared by the [place] FDA and has been authorized for detection and/or diagnosis of SARS-CoV-2 by FDA under an Emergency Use Authorization (EUA). This EUA will remain in effect (meaning this test can be used) for the duration of the COVID-19 declaration under Section 564(b)(1) of the Act, 21 U.S.C. section 360bbb-3(b)(1), unless the authorization is terminated or revoked.  Performed at [facility] [address], [place], [place] [address]     RADIOLOGY:  DG Chest 2 View  Result Date: 10/18/2021 CLINICAL DATA:  Chest pain EXAM: CHEST - 2 VIEW COMPARISON:  01/17/2017 FINDINGS: Mild elevation of the right hemidiaphragm. Bibasilar scarring, stable. Heart is normal size. No effusions or acute bony abnormality. IMPRESSION: Bibasilar scarring. Mild elevation of the right hemidiaphragm. No active disease. Electronically Signed   By: Rolm Baptise M.D.   On: 10/18/2021 19:10   CARDIAC CATHETERIZATION  Result Date: 10/19/2021   Prox RCA lesion is 20% stenosed.   Ost LM to Mid LM lesion is 30% stenosed.   Prox Cx lesion is 100% stenosed.   Prox LAD lesion is 50% stenosed.   1st Diag lesion is 40% stenosed.   A drug-eluting stent was successfully placed using a STENT ONYX FRONTIER 2.5X22.   Post intervention, there is a 0% residual stenosis.   There is mild left ventricular systolic dysfunction.   The left ventricular ejection fraction is 35-45% by visual estimate. 1.  Non-ST elevation myocardial infarction 2.  One-vessel coronary artery disease with subtotaled proximal left circumflex with TIMI 0-I flow 3.  Mild reduced left ventricular function estimated LV ejection fraction 40% 4.  Successful PCI with DES proximal left circumflex Recommendations 1.  Dual antiplatelet therapy uninterrupted for 1 year 2.  Start low-dose metoprolol succinate 12.5 mg daily 3.  Resume rosuvastatin 5 mg daily (patient  has history of statin intolerance)      Management plans discussed with the patient, family and they are in agreement.  CODE STATUS:     Code Status Orders  (From admission, onward)           Start     Ordered   10/18/21 2241  Full code  Continuous        10/18/21 2241           Code Status History     Date Active Date Inactive Code Status Order ID Comments User Context   04/30/2020 1313 05/02/2020 1614 Full Code [id number]  Dereck Leep, MD Inpatient      Advance Directive Documentation    Flowsheet Row Most Recent Value  Type of Advance Directive Living will  Pre-existing out of facility DNR order (yellow form or pink MOST form) --  "MOST" Form in Place? --       TOTAL TIME TAKING CARE OF THIS PATIENT: 34 minutes.    Loletha Grayer M.D on 10/20/2021 at 4:13 PM   [facility]  CC: Primary care physician; Jerrol Banana., MD

## 2021-10-20 NOTE — Telephone Encounter (Signed)
Copied from [place] [id number]. Topic: Appointment Scheduling - Scheduling Inquiry for Clinic >> Oct 20, 2021 11:28 AM Erick Blinks wrote: Reason for CRM: [facility] called requesting a hospital follow up for the patient within 5 days. Declined next available

## 2021-10-20 NOTE — Progress Notes (Signed)
[facility] Cardiology    SUBJECTIVE: The patient reports feeling well this morning. He denies any significant tenderness to his radial site/hand, denies any significant chest pain or shortness of breath, and is eager to go home.   Vitals:   10/19/21 1936 10/19/21 2309 10/20/21 0503 10/20/21 0758  BP: (!) 108/57 (!) 118/50 (!) 120/55 126/60  Pulse: 61 (!) 55 (!) 54 (!) 52  Resp: 18 16 16    Temp: (!) 97.5 F (36.4 C) 98.2 F (36.8 C) 97.8 F (36.6 C) 97.6 F (36.4 C)  TempSrc:    Oral  SpO2: 95% 95% 94% 96%  Weight:      Height:         Intake/Output Summary (Last 24 hours) at 10/20/2021 9935 Last data filed at 10/19/2021 2000 Gross per 24 hour  Intake --  Output 900 ml  Net -900 ml      PHYSICAL EXAM  General: Well developed, well nourished, in no acute distress HEENT:  Normocephalic and atramatic Neck:  No JVD.  Lungs: normal effort of breathing on room air. Heart: HRRR  Abdomen: nondistended  Extremities: No clubbing, cyanosis or edema. Right radial with flat bruising without significant swelling Neuro: Alert and oriented X 3. Psych:  Good affect, responds appropriately   LABS: Basic Metabolic Panel: Recent Labs    10/18/21 1846 10/20/21 0436  NA 139 139  K 4.2 4.5  CL 106 106  CO2 25 26  GLUCOSE 147* 109*  BUN 27* 24*  CREATININE 0.94 0.87  CALCIUM 9.6 8.9   Liver Function Tests: No results for input(s): AST, ALT, ALKPHOS, BILITOT, PROT, ALBUMIN in the last 72 hours. No results for input(s): LIPASE, AMYLASE in the last 72 hours. CBC: Recent Labs    10/18/21 1846 10/20/21 0436  WBC 9.3 7.3  HGB 14.2 12.2*  HCT 41.2 35.1*  MCV 94.9 98.3  PLT 200 172   Cardiac Enzymes: No results for input(s): CKTOTAL, CKMB, CKMBINDEX, TROPONINI in the last 72 hours. BNP: Invalid input(s): POCBNP D-Dimer: No results for input(s): DDIMER in the last 72 hours. Hemoglobin A1C: No results for input(s): HGBA1C in the last 72 hours. Fasting Lipid Panel: No results  for input(s): CHOL, HDL, LDLCALC, TRIG, CHOLHDL, LDLDIRECT in the last 72 hours. Thyroid Function Tests: No results for input(s): TSH, T4TOTAL, T3FREE, THYROIDAB in the last 72 hours.  Invalid input(s): FREET3 Anemia Panel: No results for input(s): VITAMINB12, FOLATE, FERRITIN, TIBC, IRON, RETICCTPCT in the last 72 hours.  DG Chest 2 View  Result Date: 10/18/2021 CLINICAL DATA:  Chest pain EXAM: CHEST - 2 VIEW COMPARISON:  01/17/2017 FINDINGS: Mild elevation of the right hemidiaphragm. Bibasilar scarring, stable. Heart is normal size. No effusions or acute bony abnormality. IMPRESSION: Bibasilar scarring. Mild elevation of the right hemidiaphragm. No active disease. Electronically Signed   By: Rolm Baptise M.D.   On: 10/18/2021 19:10   CARDIAC CATHETERIZATION  Result Date: 10/19/2021   Prox RCA lesion is 20% stenosed.   Ost LM to Mid LM lesion is 30% stenosed.   Prox Cx lesion is 100% stenosed.   Prox LAD lesion is 50% stenosed.   1st Diag lesion is 40% stenosed.   A drug-eluting stent was successfully placed using a STENT ONYX FRONTIER 2.5X22.   Post intervention, there is a 0% residual stenosis.   There is mild left ventricular systolic dysfunction.   The left ventricular ejection fraction is 35-45% by visual estimate. 1.  Non-ST elevation myocardial infarction 2.  One-vessel coronary artery  disease with subtotaled proximal left circumflex with TIMI 0-I flow 3.  Mild reduced left ventricular function estimated LV ejection fraction 40% 4.  Successful PCI with DES proximal left circumflex Recommendations 1.  Dual antiplatelet therapy uninterrupted for 1 year 2.  Start low-dose metoprolol succinate 12.5 mg daily 3.  Resume rosuvastatin 5 mg daily (patient has history of statin intolerance)       TELEMETRY: sinus bradycardia  ASSESSMENT AND PLAN:  Active Problems:   CAD in native artery   Essential hypertension   Hypothyroidism   NSTEMI (non-ST elevated myocardial infarction) ([facility])    Gastroesophageal reflux disease without esophagitis   Rheumatoid arthritis ([facility])    NSTEMI, with nondiagnostic ECG, elevated troponin (822, 819) status post cardiac catheterization, which revealed one-vessel CAD with subtotaled proximal left circumflex with mildly reduced left ventricular function with LVEF 40%, with successful PCI with DES proximal left circumflex.   Plan: Continue aspirin 81 mg and Brilinta uninterrupted for one year Continue Crestor 10 mg daily Continue low dose metoprolol succinate 12.5 mg daily Add lisinopril 5 mg due to reduced LV function 5.   Plan to order echo as outpatient 6.  Follow up with Dr. Saralyn Pilar in 1 week   Clabe Seal, PA-C 10/20/2021 9:25 AM

## 2021-10-25 ENCOUNTER — Other Ambulatory Visit: Payer: Self-pay

## 2021-10-25 ENCOUNTER — Ambulatory Visit (INDEPENDENT_AMBULATORY_CARE_PROVIDER_SITE_OTHER): Payer: Medicare Other | Admitting: Family Medicine

## 2021-10-25 VITALS — BP 150/63 | HR 59 | Temp 98.0°F | Wt 167.0 lb

## 2021-10-25 DIAGNOSIS — R5383 Other fatigue: Secondary | ICD-10-CM | POA: Diagnosis not present

## 2021-10-25 DIAGNOSIS — I200 Unstable angina: Secondary | ICD-10-CM | POA: Diagnosis not present

## 2021-10-25 DIAGNOSIS — E039 Hypothyroidism, unspecified: Secondary | ICD-10-CM

## 2021-10-25 DIAGNOSIS — I2510 Atherosclerotic heart disease of native coronary artery without angina pectoris: Secondary | ICD-10-CM

## 2021-10-25 DIAGNOSIS — I214 Non-ST elevation (NSTEMI) myocardial infarction: Secondary | ICD-10-CM | POA: Diagnosis not present

## 2021-10-25 DIAGNOSIS — I10 Essential (primary) hypertension: Secondary | ICD-10-CM | POA: Diagnosis not present

## 2021-10-25 DIAGNOSIS — E785 Hyperlipidemia, unspecified: Secondary | ICD-10-CM

## 2021-10-25 NOTE — Progress Notes (Signed)
Established patient visit   Patient: Gary Mueller   DOB: 07/03/1941   80 y.o. Male  MRN: [id number] Visit Date: 10/25/2021  Today's healthcare provider: Wilhemena Durie, MD   No chief complaint on file.  Subjective    HPI  Patient admitted for stable angina and NSTEMI and subsequently had a cardiac catheterization which showed diffuse disease PCI done in the proximal left circumflex.  EF is 35 to 45%. Cardiology recommended dual antiplatelet therapy for 1 year, low-dose metoprolol 12.5 daily and resume rosuvastatin 5 mg daily although patient has been statin intolerant in the past. He is feeling okay but is somewhat depressed.  His son died this summer.  He has no suicidal or homicidal ideation but admits to some depression and anhedonia. Follow up Hospitalization  Patient was admitted to [facility] on 10/18/2021 and discharged on 10/20/2021. He was treated for; Unstable angina pectoris, NSTEMI (non-ST elevated myocardial infarction), CAD in native artery. Treatment for this included see notes in chart. Telephone follow up was done on 10/20/2021 Patient states he is feeling slightly better than he did last week.  He is still very weak and gets short of breath with exertion.    He also complains of some dizziness when getting up.    ----------------------------------------------------------------------------------------- -     Medications: Outpatient Medications Prior to Visit  Medication Sig   acetaminophen (TYLENOL) 500 MG tablet Take 1,000 mg by mouth daily as needed for moderate pain.   ascorbic acid (VITAMIN C) 1000 MG tablet Take 1 tablet by mouth daily.   aspirin EC 81 MG EC tablet Take 1 tablet (81 mg total) by mouth daily. Swallow whole.   folic acid (FOLVITE) 1 MG tablet Take 1 mg by mouth daily.   levothyroxine (SYNTHROID) 100 MCG tablet TAKE 1 TABLET EVERY DAY ON EMPTY STOMACHWITH A GLASS OF WATER AT LEAST 30-60 MINBEFORE BREAKFAST   lisinopril (ZESTRIL) 5 MG  tablet Take 1 tablet (5 mg total) by mouth daily.   methotrexate 2.5 MG tablet Take 15 mg by mouth every Wednesday.   metoprolol succinate (TOPROL-XL) 25 MG 24 hr tablet Take 0.5 tablets (12.5 mg total) by mouth every evening.   Multiple Vitamin (MULTIVITAMIN WITH MINERALS) TABS tablet Take 1 tablet by mouth every evening.    nitroGLYCERIN (NITROSTAT) 0.4 MG SL tablet Place 1 tablet (0.4 mg total) under the tongue every 5 (five) minutes as needed for chest pain.   pantoprazole (PROTONIX) 40 MG tablet TAKE ONE TABLET (40 MG) BY MOUTH EVERY DAY   rosuvastatin (CRESTOR) 10 MG tablet Take 1 tablet (10 mg total) by mouth daily.   ticagrelor (BRILINTA) 90 MG TABS tablet Take 1 tablet (90 mg total) by mouth 2 (two) times daily.   vitamin E 180 MG (400 UNITS) capsule Take 1 capsule by mouth daily.   No facility-administered medications prior to visit.    Review of Systems  Constitutional:  Positive for fatigue.  Respiratory:  Positive for shortness of breath (with exertion).   Cardiovascular:  Positive for palpitations (with exertion). Negative for chest pain and leg swelling.  Gastrointestinal:  Negative for nausea and vomiting.  Neurological:  Positive for dizziness, weakness and headaches.   Last thyroid functions Lab Results  Component Value Date   TSH 1.630 10/26/2021       Objective    BP (!) 150/63 (BP Location: Left Arm, Patient Position: Sitting, Cuff Size: Normal)   Pulse (!) 59   Temp 98 F (36.7  C) (Oral)   Wt 167 lb (75.8 kg)   SpO2 100%   BMI 22.65 kg/m  BP Readings from Last 3 Encounters:  10/25/21 (!) 150/63  10/20/21 126/60  07/21/21 130/68   Wt Readings from Last 3 Encounters:  10/25/21 167 lb (75.8 kg)  10/18/21 170 lb (77.1 kg)  07/21/21 176 lb (79.8 kg)      Physical Exam Vitals reviewed.  Constitutional:      Appearance: Normal appearance. He is well-developed.  HENT:     Head: Normocephalic and atraumatic.     Right Ear: External ear normal.      Left Ear: External ear normal.     Nose: Nose normal.  Eyes:     General: No scleral icterus.    Conjunctiva/sclera: Conjunctivae normal.  Neck:     Thyroid: No thyromegaly.     Vascular: No carotid bruit.  Cardiovascular:     Rate and Rhythm: Normal rate and regular rhythm.     Pulses: Normal pulses.     Heart sounds: Normal heart sounds.  Pulmonary:     Effort: Pulmonary effort is normal.     Breath sounds: Normal breath sounds.  Abdominal:     Palpations: Abdomen is soft.  Lymphadenopathy:     Cervical: No cervical adenopathy.  Skin:    General: Skin is warm and dry.     Capillary Refill: Capillary refill takes less than 2 seconds.     Comments: Bruising without tenderness or swelling of the right forearm from his cardiac catheterization  Neurological:     General: No focal deficit present.     Mental Status: He is alert and oriented to person, place, and time.  Psychiatric:        Mood and Affect: Mood normal.        Behavior: Behavior normal.        Thought Content: Thought content normal.        Judgment: Judgment normal.      No results found for any visits on 10/25/21.  Assessment & Plan     1. NSTEMI (non-ST elevated myocardial infarction) [name] She had PCI to circumflex.  Restively treat all risk factors.  Dual antiplatelet therapy for 1 year and he is also on metoprolol for the time being  2. CAD in native artery All risk factors treated  3. Essential hypertension Lisinopril metoprolol - CBC with Differential/Platelet - Comprehensive metabolic panel - TSH  4. Adult hypothyroidism  - TSH  5. Hyperlipidemia, unspecified hyperlipidemia type L rosuvastatin 5, states he has been statin intolerant in the past.  Goal LDL less than 70 - Lipid Panel With LDL/HDL Ratio  6. Other fatigue/adjustment reaction  Consider sertraline.  Refer to [name] for counseling  - CBC with Differential/Platelet - Comprehensive metabolic panel - TSH   No  follow-ups on file.      I, Wilhemena Durie, MD, have reviewed all documentation for this visit. The documentation on 10/30/21 for the exam, diagnosis, procedures, and orders are all accurate and complete.    Taiten Brawn Cranford Mon, MD  [facility] [phone] (phone) [phone] (fax)  [place]

## 2021-10-25 NOTE — Patient Instructions (Signed)
Call Gary Mueller

## 2021-10-26 DIAGNOSIS — I10 Essential (primary) hypertension: Secondary | ICD-10-CM | POA: Diagnosis not present

## 2021-10-26 DIAGNOSIS — E785 Hyperlipidemia, unspecified: Secondary | ICD-10-CM | POA: Diagnosis not present

## 2021-10-26 DIAGNOSIS — R5383 Other fatigue: Secondary | ICD-10-CM | POA: Diagnosis not present

## 2021-10-26 DIAGNOSIS — E039 Hypothyroidism, unspecified: Secondary | ICD-10-CM | POA: Diagnosis not present

## 2021-10-27 DIAGNOSIS — E785 Hyperlipidemia, unspecified: Secondary | ICD-10-CM | POA: Diagnosis not present

## 2021-10-27 DIAGNOSIS — I2510 Atherosclerotic heart disease of native coronary artery without angina pectoris: Secondary | ICD-10-CM | POA: Diagnosis not present

## 2021-10-27 DIAGNOSIS — I493 Ventricular premature depolarization: Secondary | ICD-10-CM | POA: Diagnosis not present

## 2021-10-27 DIAGNOSIS — I214 Non-ST elevation (NSTEMI) myocardial infarction: Secondary | ICD-10-CM | POA: Diagnosis not present

## 2021-10-27 DIAGNOSIS — Z23 Encounter for immunization: Secondary | ICD-10-CM | POA: Diagnosis not present

## 2021-10-27 DIAGNOSIS — R0602 Shortness of breath: Secondary | ICD-10-CM | POA: Diagnosis not present

## 2021-10-27 LAB — COMPREHENSIVE METABOLIC PANEL
ALT: 21 IU/L (ref 0–44)
AST: 19 IU/L (ref 0–40)
Albumin/Globulin Ratio: 2.4 — ABNORMAL HIGH (ref 1.2–2.2)
Albumin: 4.5 g/dL (ref 3.6–4.6)
Alkaline Phosphatase: 91 IU/L (ref 44–121)
BUN/Creatinine Ratio: 22 (ref 10–24)
BUN: 24 mg/dL (ref 8–27)
Bilirubin Total: 0.7 mg/dL (ref 0.0–1.2)
CO2: 23 mmol/L (ref 20–29)
Calcium: 9.8 mg/dL (ref 8.6–10.2)
Chloride: 104 mmol/L (ref 96–106)
Creatinine, Ser: 1.07 mg/dL (ref 0.76–1.27)
Globulin, Total: 1.9 g/dL (ref 1.5–4.5)
Glucose: 104 mg/dL — ABNORMAL HIGH (ref 70–99)
Potassium: 4.7 mmol/L (ref 3.5–5.2)
Sodium: 141 mmol/L (ref 134–144)
Total Protein: 6.4 g/dL (ref 6.0–8.5)
eGFR: 69 mL/min/{1.73_m2} (ref >59)

## 2021-10-27 LAB — CBC WITH DIFFERENTIAL/PLATELET
Basophils Absolute: 0 10*3/uL (ref 0.0–0.2)
Basos: 0 %
EOS (ABSOLUTE): 0.1 10*3/uL (ref 0.0–0.4)
Eos: 1 %
Hematocrit: 41.7 % (ref 37.5–51.0)
Hemoglobin: 14 g/dL (ref 13.0–17.7)
Immature Grans (Abs): 0 10*3/uL (ref 0.0–0.1)
Immature Granulocytes: 0 %
Lymphocytes Absolute: 2.1 10*3/uL (ref 0.7–3.1)
Lymphs: 29 %
MCH: 32.8 pg (ref 26.6–33.0)
MCHC: 33.6 g/dL (ref 31.5–35.7)
MCV: 98 fL — ABNORMAL HIGH (ref 79–97)
Monocytes Absolute: 0.6 10*3/uL (ref 0.1–0.9)
Monocytes: 8 %
Neutrophils Absolute: 4.4 10*3/uL (ref 1.4–7.0)
Neutrophils: 62 %
Platelets: 233 10*3/uL (ref 150–450)
RBC: 4.27 x10E6/uL (ref 4.14–5.80)
RDW: 12.9 % (ref 11.6–15.4)
WBC: 7.2 10*3/uL (ref 3.4–10.8)

## 2021-10-27 LAB — LIPID PANEL WITH LDL/HDL RATIO
Cholesterol, Total: 154 mg/dL (ref 100–199)
HDL: 38 mg/dL — ABNORMAL LOW (ref >39)
LDL Chol Calc (NIH): 99 mg/dL (ref 0–99)
LDL/HDL Ratio: 2.6 ratio (ref 0.0–3.6)
Triglycerides: 89 mg/dL (ref 0–149)
VLDL Cholesterol Cal: 17 mg/dL (ref 5–40)

## 2021-10-27 LAB — TSH: TSH: 1.63 u[IU]/mL (ref 0.450–4.500)

## 2021-10-28 ENCOUNTER — Other Ambulatory Visit: Payer: Self-pay

## 2021-10-28 ENCOUNTER — Other Ambulatory Visit: Payer: Self-pay | Admitting: *Deleted

## 2021-10-28 ENCOUNTER — Encounter: Payer: Medicare Other | Attending: Cardiology | Admitting: *Deleted

## 2021-10-28 DIAGNOSIS — Z48812 Encounter for surgical aftercare following surgery on the circulatory system: Secondary | ICD-10-CM | POA: Insufficient documentation

## 2021-10-28 DIAGNOSIS — Z955 Presence of coronary angioplasty implant and graft: Secondary | ICD-10-CM

## 2021-10-28 DIAGNOSIS — I252 Old myocardial infarction: Secondary | ICD-10-CM | POA: Insufficient documentation

## 2021-10-28 DIAGNOSIS — I214 Non-ST elevation (NSTEMI) myocardial infarction: Secondary | ICD-10-CM

## 2021-10-28 MED ORDER — ROSUVASTATIN CALCIUM 40 MG PO TABS: 40.0000 mg | ORAL_TABLET | Freq: Every day | ORAL | 1 refills | Status: DC

## 2021-10-28 NOTE — Progress Notes (Signed)
Initial telephone orientation completed. Documentation can be found in [facility] 10/25. EP orientation scheduled for Monday 11/21 at 3pm.

## 2021-11-08 DIAGNOSIS — Z85828 Personal history of other malignant neoplasm of skin: Secondary | ICD-10-CM | POA: Diagnosis not present

## 2021-11-09 DIAGNOSIS — H353132 Nonexudative age-related macular degeneration, bilateral, intermediate dry stage: Secondary | ICD-10-CM | POA: Diagnosis not present

## 2021-11-09 DIAGNOSIS — H35352 Cystoid macular degeneration, left eye: Secondary | ICD-10-CM | POA: Diagnosis not present

## 2021-11-09 DIAGNOSIS — Z961 Presence of intraocular lens: Secondary | ICD-10-CM | POA: Diagnosis not present

## 2021-11-14 ENCOUNTER — Other Ambulatory Visit: Payer: Self-pay

## 2021-11-14 VITALS — Ht 70.0 in | Wt 168.1 lb

## 2021-11-14 DIAGNOSIS — I214 Non-ST elevation (NSTEMI) myocardial infarction: Secondary | ICD-10-CM | POA: Diagnosis not present

## 2021-11-14 DIAGNOSIS — Z48812 Encounter for surgical aftercare following surgery on the circulatory system: Secondary | ICD-10-CM | POA: Diagnosis not present

## 2021-11-14 DIAGNOSIS — Z955 Presence of coronary angioplasty implant and graft: Secondary | ICD-10-CM | POA: Diagnosis not present

## 2021-11-14 DIAGNOSIS — I252 Old myocardial infarction: Secondary | ICD-10-CM | POA: Diagnosis not present

## 2021-11-14 NOTE — Patient Instructions (Signed)
Patient Instructions  Patient Details  Name: Gary Mueller MRN: [id number] Date of Birth: 1941-09-02 Referring Provider:  Isaias Cowman, MD  Below are your personal goals for exercise, nutrition, and risk factors. Our goal is to help you stay on track towards obtaining and maintaining these goals. We will be discussing your progress on these goals with you throughout the program.  Initial Exercise Prescription:  Initial Exercise Prescription - 11/14/21 1500       Date of Initial Exercise RX and Referring Provider   Date 11/14/21    Referring Provider Paraschos      Oxygen   Maintain Oxygen Saturation 88% or higher      Treadmill   MPH 2    Grade 0.5    Minutes 15    METs 2.6      NuStep   Level 2    SPM 80    Minutes 15    METs 2.1      Recumbant Elliptical   Level 1    RPM 50    Minutes 15    METs 2.1      Biostep-RELP   Level 2    SPM 50    Minutes 15    METs 2      Prescription Details   Frequency (times per week) 3    Duration Progress to 30 minutes of continuous aerobic without signs/symptoms of physical distress      Intensity   THRR 40-80% of Max Heartrate 91-122    Ratings of Perceived Exertion 11-13    Perceived Dyspnea 0-4      Resistance Training   Training Prescription Yes    Weight 3 lb    Reps 10-15             Exercise Goals: Frequency: Be able to perform aerobic exercise two to three times per week in program working toward 2-5 days per week of home exercise.  Intensity: Work with a perceived exertion of 11 (fairly light) - 15 (hard) while following your exercise prescription.  We will make changes to your prescription with you as you progress through the program.   Duration: Be able to do 30 to 45 minutes of continuous aerobic exercise in addition to a 5 minute warm-up and a 5 minute cool-down routine.   Nutrition Goals: Your personal nutrition goals will be established when you do your nutrition analysis with the  dietician.  The following are general nutrition guidelines to follow: Cholesterol < 200mg /day Sodium < 1500mg /day Fiber: Men over 50 yrs - 30 grams per day  Personal Goals:  Personal Goals and Risk Factors at Admission - 11/14/21 1602       Core Components/Risk Factors/Patient Goals on Admission   Hypertension Yes    Intervention Provide education on lifestyle modifcations including regular physical activity/exercise, weight management, moderate sodium restriction and increased consumption of fresh fruit, vegetables, and low fat dairy, alcohol moderation, and smoking cessation.;Monitor prescription use compliance.    Expected Outcomes Short Term: Continued assessment and intervention until BP is < 140/70mm HG in hypertensive participants. < 130/35mm HG in hypertensive participants with diabetes, heart failure or chronic kidney disease.;Long Term: Maintenance of blood pressure at goal levels.    Lipids Yes    Intervention Provide education and support for participant on nutrition & aerobic/resistive exercise along with prescribed medications to achieve LDL 70mg , HDL >40mg .    Expected Outcomes Short Term: Participant states understanding of desired cholesterol values and is compliant with medications prescribed.  Participant is following exercise prescription and nutrition guidelines.;Long Term: Cholesterol controlled with medications as prescribed, with individualized exercise RX and with personalized nutrition plan. Value goals: LDL < 70mg , HDL > 40 mg.             Tobacco Use Initial Evaluation: Social History   Tobacco Use  Smoking Status Former   Packs/day: 1.50   Types: Cigarettes   Quit date: 12/24/1964   Years since quitting: 56.9  Smokeless Tobacco Never    Exercise Goals and Review:  Exercise Goals     Row Name 11/14/21 1600             Exercise Goals   Increase Physical Activity Yes       Intervention Provide advice, education, support and counseling about  physical activity/exercise needs.;Develop an individualized exercise prescription for aerobic and resistive training based on initial evaluation findings, risk stratification, comorbidities and participant's personal goals.       Expected Outcomes Short Term: Attend rehab on a regular basis to increase amount of physical activity.;Long Term: Add in home exercise to make exercise part of routine and to increase amount of physical activity.;Long Term: Exercising regularly at least 3-5 days a week.       Increase Strength and Stamina Yes       Intervention Provide advice, education, support and counseling about physical activity/exercise needs.;Develop an individualized exercise prescription for aerobic and resistive training based on initial evaluation findings, risk stratification, comorbidities and participant's personal goals.       Expected Outcomes Short Term: Increase workloads from initial exercise prescription for resistance, speed, and METs.;Short Term: Perform resistance training exercises routinely during rehab and add in resistance training at home;Long Term: Improve cardiorespiratory fitness, muscular endurance and strength as measured by increased METs and functional capacity (6MWT)       Able to understand and use rate of perceived exertion (RPE) scale Yes       Intervention Provide education and explanation on how to use RPE scale       Expected Outcomes Short Term: Able to use RPE daily in rehab to express subjective intensity level;Long Term:  Able to use RPE to guide intensity level when exercising independently       Able to understand and use Dyspnea scale Yes       Intervention Provide education and explanation on how to use Dyspnea scale       Expected Outcomes Short Term: Able to use Dyspnea scale daily in rehab to express subjective sense of shortness of breath during exertion;Long Term: Able to use Dyspnea scale to guide intensity level when exercising independently       Knowledge  and understanding of Target Heart Rate Range (THRR) Yes       Intervention Provide education and explanation of THRR including how the numbers were predicted and where they are located for reference       Expected Outcomes Short Term: Able to state/look up THRR;Short Term: Able to use daily as guideline for intensity in rehab;Long Term: Able to use THRR to govern intensity when exercising independently       Able to check pulse independently Yes       Intervention Provide education and demonstration on how to check pulse in carotid and radial arteries.;Review the importance of being able to check your own pulse for safety during independent exercise       Expected Outcomes Short Term: Able to explain why pulse checking is important during independent  exercise;Long Term: Able to check pulse independently and accurately       Understanding of Exercise Prescription Yes       Intervention Provide education, explanation, and written materials on patient's individual exercise prescription       Expected Outcomes Short Term: Able to explain program exercise prescription;Long Term: Able to explain home exercise prescription to exercise independently                Copy of goals given to participant.

## 2021-11-14 NOTE — Progress Notes (Signed)
Cardiac Individual Treatment Plan  Patient Details  Name: Gary Mueller MRN: [id number] Date of Birth: 07/23/1941 Referring Provider:   Flowsheet Row Cardiac Rehab from 11/14/2021 in [facility] Cardiac and Pulmonary Rehab  Referring Provider Paraschos       Initial Encounter Date:  Flowsheet Row Cardiac Rehab from 11/14/2021 in [facility] Cardiac and Pulmonary Rehab  Date 11/14/21       Visit Diagnosis: NSTEMI (non-ST elevated myocardial infarction) [facility])  Status post coronary artery stent placement  Patient's Home Medications on Admission:  Current Outpatient Medications:    acetaminophen (TYLENOL) 500 MG tablet, Take 1,000 mg by mouth daily as needed for moderate pain., Disp: , Rfl:    ascorbic acid (VITAMIN C) 1000 MG tablet, Take 1 tablet by mouth daily., Disp: , Rfl:    aspirin EC 81 MG EC tablet, Take 1 tablet (81 mg total) by mouth daily. Swallow whole., Disp: 30 tablet, Rfl: 0   folic acid (FOLVITE) 1 MG tablet, Take 1 mg by mouth daily., Disp: , Rfl:    levothyroxine (SYNTHROID) 100 MCG tablet, TAKE 1 TABLET EVERY DAY ON EMPTY STOMACHWITH A GLASS OF WATER AT LEAST 30-60 MINBEFORE BREAKFAST, Disp: 90 tablet, Rfl: 0   lisinopril (ZESTRIL) 5 MG tablet, Take 1 tablet (5 mg total) by mouth daily., Disp: 30 tablet, Rfl: 0   methotrexate 2.5 MG tablet, Take 15 mg by mouth every Wednesday., Disp: , Rfl:    metoprolol succinate (TOPROL-XL) 25 MG 24 hr tablet, Take 0.5 tablets (12.5 mg total) by mouth every evening., Disp: 15 tablet, Rfl: 0   Multiple Vitamin (MULTIVITAMIN WITH MINERALS) TABS tablet, Take 1 tablet by mouth every evening. , Disp: , Rfl:    nitroGLYCERIN (NITROSTAT) 0.4 MG SL tablet, Place 1 tablet (0.4 mg total) under the tongue every 5 (five) minutes as needed for chest pain., Disp: 30 tablet, Rfl: 0   pantoprazole (PROTONIX) 40 MG tablet, TAKE ONE TABLET (40 MG) BY MOUTH EVERY DAY, Disp: 90 tablet, Rfl: 1   rosuvastatin (CRESTOR) 40 MG tablet, Take 1 tablet (40 mg total) by  mouth daily., Disp: 90 tablet, Rfl: 1   ticagrelor (BRILINTA) 90 MG TABS tablet, Take 1 tablet (90 mg total) by mouth 2 (two) times daily., Disp: 60 tablet, Rfl: 0   vitamin E 180 MG (400 UNITS) capsule, Take 1 capsule by mouth daily., Disp: , Rfl:   Past Medical History: Past Medical History:  Diagnosis Date   Allergy    Anxiety    Arthritis    Cancer (Diagonal) 10/20/2016   Basal Cell Skin Cancer; lip, neck   Cystoid macular edema of left eye 07/07/2019   Depression    Dysrhythmia    A FIB   GERD (gastroesophageal reflux disease)    HOH (hard of hearing)    Bilateral   Hyperlipidemia    Hypertension    Patient denies   Hypothyroidism    IBS (irritable bowel syndrome)    Inguinal hernia    bilateral   Pneumonia    Thyroid disease    Tinnitus of both ears     Tobacco Use: Social History   Tobacco Use  Smoking Status Former   Packs/day: 1.50   Types: Cigarettes   Quit date: 12/24/1964   Years since quitting: 56.9  Smokeless Tobacco Never    Labs: Recent Review Flowsheet Data     Labs for ITP Cardiac and Pulmonary Rehab Latest Ref Rng & Units 01/10/2018 06/16/2019 01/28/2020 02/01/2021 10/26/2021   Cholestrol 100 -  199 mg/dL 212(H) 150 193 201(H) 154   LDLCALC 0 - 99 mg/dL 121(H) 81 134(H) 139(H) 99   HDL >39 mg/dL 33(L) 49 40 40 38(L)   Trlycerides 0 - 149 mg/dL 290(H) 101 103 119 89        Exercise Target Goals: Exercise Program Goal: Individual exercise prescription set using results from initial 6 min walk test and THRR while considering  patient's activity barriers and safety.   Exercise Prescription Goal: Initial exercise prescription builds to 30-45 minutes a day of aerobic activity, 2-3 days per week.  Home exercise guidelines will be given to patient during program as part of exercise prescription that the participant will acknowledge.   Education: Aerobic Exercise: - Group verbal and visual presentation on the components of exercise prescription.  Introduces F.I.T.T principle from ACSM for exercise prescriptions.  Reviews F.I.T.T. principles of aerobic exercise including progression. Written material given at graduation.   Education: Resistance Exercise: - Group verbal and visual presentation on the components of exercise prescription. Introduces F.I.T.T principle from ACSM for exercise prescriptions  Reviews F.I.T.T. principles of resistance exercise including progression. Written material given at graduation.    Education: Exercise & Equipment Safety: - Individual verbal instruction and demonstration of equipment use and safety with use of the equipment. Flowsheet Row Cardiac Rehab from 11/14/2021 in [facility] Cardiac and Pulmonary Rehab  Date 11/14/21  Educator AS  Instruction Review Code 1- Verbalizes Understanding       Education: Exercise Physiology & General Exercise Guidelines: - Group verbal and written instruction with models to review the exercise physiology of the cardiovascular system and associated critical values. Provides general exercise guidelines with specific guidelines to those with heart or lung disease.  Flowsheet Row Cardiac Rehab from 11/14/2021 in [facility] Cardiac and Pulmonary Rehab  Education need identified 11/14/21       Education: Flexibility, Balance, Mind/Body Relaxation: - Group verbal and visual presentation with interactive activity on the components of exercise prescription. Introduces F.I.T.T principle from ACSM for exercise prescriptions. Reviews F.I.T.T. principles of flexibility and balance exercise training including progression. Also discusses the mind body connection.  Reviews various relaxation techniques to help reduce and manage stress (i.e. Deep breathing, progressive muscle relaxation, and visualization). Balance handout provided to take home. Written material given at graduation.   Activity Barriers & Risk Stratification:  Activity Barriers & Cardiac Risk Stratification - 10/28/21 1444        Activity Barriers & Cardiac Risk Stratification   Activity Barriers Left Knee Replacement;Right Knee Replacement    Cardiac Risk Stratification Moderate             6 Minute Walk:  6 Minute Walk     Row Name 11/14/21 1549         6 Minute Walk   Phase Initial     Distance 1205 feet     Walk Time 6 minutes     # of Rest Breaks 0     MPH 2.28     METS 2.1     RPE 11     Perceived Dyspnea  1     VO2 Peak 7.4     Symptoms No     Resting HR 60 bpm     Resting BP 104/60     Resting Oxygen Saturation  98 %     Exercise Oxygen Saturation  during 6 min walk 97 %     Max Ex. HR 76 bpm     Max Ex. BP 132/60  2 Minute Post BP 114/58              Oxygen Initial Assessment:   Oxygen Re-Evaluation:   Oxygen Discharge (Final Oxygen Re-Evaluation):   Initial Exercise Prescription:  Initial Exercise Prescription - 11/14/21 1500       Date of Initial Exercise RX and Referring Provider   Date 11/14/21    Referring Provider Paraschos      Oxygen   Maintain Oxygen Saturation 88% or higher      Treadmill   MPH 2    Grade 0.5    Minutes 15    METs 2.6      NuStep   Level 2    SPM 80    Minutes 15    METs 2.1      Recumbant Elliptical   Level 1    RPM 50    Minutes 15    METs 2.1      Biostep-RELP   Level 2    SPM 50    Minutes 15    METs 2      Prescription Details   Frequency (times per week) 3    Duration Progress to 30 minutes of continuous aerobic without signs/symptoms of physical distress      Intensity   THRR 40-80% of Max Heartrate 91-122    Ratings of Perceived Exertion 11-13    Perceived Dyspnea 0-4      Resistance Training   Training Prescription Yes    Weight 3 lb    Reps 10-15             Perform Capillary Blood Glucose checks as needed.  Exercise Prescription Changes:   Exercise Prescription Changes     Row Name 11/14/21 1600             Response to Exercise   Blood Pressure (Admit) 104/60        Blood Pressure (Exercise) 132/60       Blood Pressure (Exit) 114/58       Heart Rate (Admit) 60 bpm       Heart Rate (Exercise) 76 bpm       Heart Rate (Exit) 61 bpm       Oxygen Saturation (Admit) 98 %       Oxygen Saturation (Exercise) 97 %       Rating of Perceived Exertion (Exercise) 11       Perceived Dyspnea (Exercise) 1       Symptoms none                Exercise Comments:   Exercise Goals and Review:   Exercise Goals     Row Name 11/14/21 1600             Exercise Goals   Increase Physical Activity Yes       Intervention Provide advice, education, support and counseling about physical activity/exercise needs.;Develop an individualized exercise prescription for aerobic and resistive training based on initial evaluation findings, risk stratification, comorbidities and participant's personal goals.       Expected Outcomes Short Term: Attend rehab on a regular basis to increase amount of physical activity.;Long Term: Add in home exercise to make exercise part of routine and to increase amount of physical activity.;Long Term: Exercising regularly at least 3-5 days a week.       Increase Strength and Stamina Yes       Intervention Provide advice, education, support and counseling about physical activity/exercise needs.;Develop an individualized exercise  prescription for aerobic and resistive training based on initial evaluation findings, risk stratification, comorbidities and participant's personal goals.       Expected Outcomes Short Term: Increase workloads from initial exercise prescription for resistance, speed, and METs.;Short Term: Perform resistance training exercises routinely during rehab and add in resistance training at home;Long Term: Improve cardiorespiratory fitness, muscular endurance and strength as measured by increased METs and functional capacity (6MWT)       Able to understand and use rate of perceived exertion (RPE) scale Yes       Intervention Provide  education and explanation on how to use RPE scale       Expected Outcomes Short Term: Able to use RPE daily in rehab to express subjective intensity level;Long Term:  Able to use RPE to guide intensity level when exercising independently       Able to understand and use Dyspnea scale Yes       Intervention Provide education and explanation on how to use Dyspnea scale       Expected Outcomes Short Term: Able to use Dyspnea scale daily in rehab to express subjective sense of shortness of breath during exertion;Long Term: Able to use Dyspnea scale to guide intensity level when exercising independently       Knowledge and understanding of Target Heart Rate Range (THRR) Yes       Intervention Provide education and explanation of THRR including how the numbers were predicted and where they are located for reference       Expected Outcomes Short Term: Able to state/look up THRR;Short Term: Able to use daily as guideline for intensity in rehab;Long Term: Able to use THRR to govern intensity when exercising independently       Able to check pulse independently Yes       Intervention Provide education and demonstration on how to check pulse in carotid and radial arteries.;Review the importance of being able to check your own pulse for safety during independent exercise       Expected Outcomes Short Term: Able to explain why pulse checking is important during independent exercise;Long Term: Able to check pulse independently and accurately       Understanding of Exercise Prescription Yes       Intervention Provide education, explanation, and written materials on patient's individual exercise prescription       Expected Outcomes Short Term: Able to explain program exercise prescription;Long Term: Able to explain home exercise prescription to exercise independently                Exercise Goals Re-Evaluation :   Discharge Exercise Prescription (Final Exercise Prescription Changes):  Exercise Prescription  Changes - 11/14/21 1600       Response to Exercise   Blood Pressure (Admit) 104/60    Blood Pressure (Exercise) 132/60    Blood Pressure (Exit) 114/58    Heart Rate (Admit) 60 bpm    Heart Rate (Exercise) 76 bpm    Heart Rate (Exit) 61 bpm    Oxygen Saturation (Admit) 98 %    Oxygen Saturation (Exercise) 97 %    Rating of Perceived Exertion (Exercise) 11    Perceived Dyspnea (Exercise) 1    Symptoms none             Nutrition:  Target Goals: Understanding of nutrition guidelines, daily intake of sodium 1500mg , cholesterol 200mg , calories 30% from fat and 7% or less from saturated fats, daily to have 5 or more servings of fruits and vegetables.  Education: All About Nutrition: -Group instruction provided by verbal, written material, interactive activities, discussions, models, and posters to present general guidelines for heart healthy nutrition including fat, fiber, MyPlate, the role of sodium in heart healthy nutrition, utilization of the nutrition label, and utilization of this knowledge for meal planning. Follow up email sent as well. Written material given at graduation.   Biometrics:  Pre Biometrics - 11/14/21 1601       Pre Biometrics   Height 5\' 10"  (1.778 m)    Weight 168 lb 1.6 oz (76.2 kg)    BMI (Calculated) 24.12    Single Leg Stand 2.2 seconds              Nutrition Therapy Plan and Nutrition Goals:   Nutrition Assessments:  MEDIFICTS Score Key: ?70 Need to make dietary changes  40-70 Heart Healthy Diet ? 40 Therapeutic Level Cholesterol Diet   Picture Your Plate Scores: <54 Unhealthy dietary pattern with much room for improvement. 41-50 Dietary pattern unlikely to meet recommendations for good health and room for improvement. 51-60 More healthful dietary pattern, with some room for improvement.  >60 Healthy dietary pattern, although there may be some specific behaviors that could be improved.    Nutrition Goals  Re-Evaluation:   Nutrition Goals Discharge (Final Nutrition Goals Re-Evaluation):   Psychosocial: Target Goals: Acknowledge presence or absence of significant depression and/or stress, maximize coping skills, provide positive support system. Participant is able to verbalize types and ability to use techniques and skills needed for reducing stress and depression.   Education: Stress, Anxiety, and Depression - Group verbal and visual presentation to define topics covered.  Reviews how body is impacted by stress, anxiety, and depression.  Also discusses healthy ways to reduce stress and to treat/manage anxiety and depression.  Written material given at graduation.   Education: Sleep Hygiene -Provides group verbal and written instruction about how sleep can affect your health.  Define sleep hygiene, discuss sleep cycles and impact of sleep habits. Review good sleep hygiene tips.    Initial Review & Psychosocial Screening:  Initial Psych Review & Screening - 10/28/21 1448       Initial Review   Current issues with Current Stress Concerns    Source of Stress Concerns Family    Comments son passed 09/06/21; handicapped son still lives at home      [place]? Yes   daughter, neighbors     Barriers   Psychosocial barriers to participate in program The patient should benefit from training in stress management and relaxation.      Screening Interventions   Interventions Encouraged to exercise;Provide feedback about the scores to participant;To provide support and resources with identified psychosocial needs    Expected Outcomes Short Term goal: Utilizing psychosocial counselor, staff and physician to assist with identification of specific Stressors or current issues interfering with healing process. Setting desired goal for each stressor or current issue identified.;Long Term Goal: Stressors or current issues are controlled or eliminated.;Long Term goal: The  participant improves quality of Life and PHQ9 Scores as seen by post scores and/or verbalization of changes;Short Term goal: Identification and review with participant of any Quality of Life or Depression concerns found by scoring the questionnaire.             Quality of Life Scores:   Scores of 19 and below usually indicate a poorer quality of life in these areas.  A difference of  2-3 points is a  clinically meaningful difference.  A difference of 2-3 points in the total score of the Quality of Life Index has been associated with significant improvement in overall quality of life, self-image, physical symptoms, and general health in studies assessing change in quality of life.  PHQ-9: Recent Review Flowsheet Data     Depression screen [facility] 2/9 10/25/2021 01/31/2021 06/29/2020 02/07/2019 01/09/2018   Decreased Interest 1 0 0 0 0   Down, Depressed, Hopeless 1 1 0 1 0   PHQ - 2 Score 2 1 0 1 0   Altered sleeping 1 0 - - -   Tired, decreased energy 3 1 - - -   Change in appetite 1 0 - - -   Feeling bad or failure about yourself  0 0 - - -   Trouble concentrating 0 0 - - -   Moving slowly or fidgety/restless 0 0 - - -   Suicidal thoughts 0 0 - - -   PHQ-9 Score 7 2 - - -   Difficult doing work/chores Somewhat difficult Not difficult at all - - -      Interpretation of Total Score  Total Score Depression Severity:  1-4 = Minimal depression, 5-9 = Mild depression, 10-14 = Moderate depression, 15-19 = Moderately severe depression, 20-27 = Severe depression   Psychosocial Evaluation and Intervention:  Psychosocial Evaluation - 10/28/21 1500       Psychosocial Evaluation & Interventions   Interventions Encouraged to exercise with the program and follow exercise prescription;Stress management education    Comments Rush Landmark reports feeling well 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 Prior to his hospitalization, he was walking 8-10,000 steps a day and golfing three times a week. He wants to get back to that and is  trying to work his way up to it. When asked about stress, he replied that he guesses he is under some and his doctor thinks he is as well. His 60 year old son passed away 15-Sep-2021 so he is still dealing with that. His daughter lives local and his handicapped son lives with him. He remembers brining his wife, who has now passed away, to [employer] and thinks this program will be good for him and will give him guidance.    Expected Outcomes Short: attend Cardiac Rehab for education and exercise. Long: develop and maintain positive self care habits.    Continue Psychosocial Services  Follow up required by staff             Psychosocial Re-Evaluation:   Psychosocial Discharge (Final Psychosocial Re-Evaluation):   Vocational Rehabilitation: Provide vocational rehab assistance to qualifying candidates.   Vocational Rehab Evaluation & Intervention:  Vocational Rehab - 10/28/21 1448       Initial Vocational Rehab Evaluation & Intervention   Assessment shows need for Vocational Rehabilitation No             Education: Education Goals: Education classes will be provided on a variety of topics geared toward better understanding of heart health and risk factor modification. Participant will state understanding/return demonstration of topics presented as noted by education test scores.  Learning Barriers/Preferences:  Learning Barriers/Preferences - 10/28/21 1448       Learning Barriers/Preferences   Learning Barriers None    Learning Preferences None             General Cardiac Education Topics:  AED/CPR: - Group verbal and written instruction with the use of models to demonstrate the basic use of the AED with the basic ABC's of  resuscitation.   Anatomy and Cardiac Procedures: - Group verbal and visual presentation and models provide information about basic cardiac anatomy and function. Reviews the testing methods done to diagnose heart disease and the outcomes of the test  results. Describes the treatment choices: Medical Management, Angioplasty, or Coronary Bypass Surgery for treating various heart conditions including Myocardial Infarction, Angina, Valve Disease, and Cardiac Arrhythmias.  Written material given at graduation.   Medication Safety: - Group verbal and visual instruction to review commonly prescribed medications for heart and lung disease. Reviews the medication, class of the drug, and side effects. Includes the steps to properly store meds and maintain the prescription regimen.  Written material given at graduation.   Intimacy: - Group verbal instruction through game format to discuss how heart and lung disease can affect sexual intimacy. Written material given at graduation..   Know Your Numbers and Heart Failure: - Group verbal and visual instruction to discuss disease risk factors for cardiac and pulmonary disease and treatment options.  Reviews associated critical values for Overweight/Obesity, Hypertension, Cholesterol, and Diabetes.  Discusses basics of heart failure: signs/symptoms and treatments.  Introduces Heart Failure Zone chart for action plan for heart failure.  Written material given at graduation.   Infection Prevention: - Provides verbal and written material to individual with discussion of infection control including proper hand washing and proper equipment cleaning during exercise session. Flowsheet Row Cardiac Rehab from 11/14/2021 in [facility] Cardiac and Pulmonary Rehab  Date 11/14/21  Educator AS  Instruction Review Code 1- Verbalizes Understanding       Falls Prevention: - Provides verbal and written material to individual with discussion of falls prevention and safety. Flowsheet Row Cardiac Rehab from 11/14/2021 in [facility] Cardiac and Pulmonary Rehab  Date 11/14/21  Educator AS  Instruction Review Code 1- Verbalizes Understanding       Other: -Provides group and verbal instruction on various topics (see  comments)   Knowledge Questionnaire Score:   Core Components/Risk Factors/Patient Goals at Admission:  Personal Goals and Risk Factors at Admission - 11/14/21 1602       Core Components/Risk Factors/Patient Goals on Admission   Hypertension Yes    Intervention Provide education on lifestyle modifcations including regular physical activity/exercise, weight management, moderate sodium restriction and increased consumption of fresh fruit, vegetables, and low fat dairy, alcohol moderation, and smoking cessation.;Monitor prescription use compliance.    Expected Outcomes Short Term: Continued assessment and intervention until BP is < 140/19mm HG in hypertensive participants. < 130/58mm HG in hypertensive participants with diabetes, heart failure or chronic kidney disease.;Long Term: Maintenance of blood pressure at goal levels.    Lipids Yes    Intervention Provide education and support for participant on nutrition & aerobic/resistive exercise along with prescribed medications to achieve LDL 70mg , HDL >40mg .    Expected Outcomes Short Term: Participant states understanding of desired cholesterol values and is compliant with medications prescribed. Participant is following exercise prescription and nutrition guidelines.;Long Term: Cholesterol controlled with medications as prescribed, with individualized exercise RX and with personalized nutrition plan. Value goals: LDL < 70mg , HDL > 40 mg.             Education:Diabetes - Individual verbal and written instruction to review signs/symptoms of diabetes, desired ranges of glucose level fasting, after meals and with exercise. Acknowledge that pre and post exercise glucose checks will be done for 3 sessions at entry of program.   Core Components/Risk Factors/Patient Goals Review:    Core Components/Risk Factors/Patient Goals at Discharge (  Final Review):    ITP Comments:  ITP Comments     Row Name 10/28/21 1444           ITP Comments  Initial telephone orientation completed. Documentation can be found in [facility] 10/25. EP orientation scheduled for Monday 11/21 at 3pm.                Comments: initial ITP

## 2021-11-21 DIAGNOSIS — E785 Hyperlipidemia, unspecified: Secondary | ICD-10-CM | POA: Diagnosis not present

## 2021-11-21 DIAGNOSIS — I2510 Atherosclerotic heart disease of native coronary artery without angina pectoris: Secondary | ICD-10-CM | POA: Diagnosis not present

## 2021-11-21 DIAGNOSIS — I214 Non-ST elevation (NSTEMI) myocardial infarction: Secondary | ICD-10-CM | POA: Diagnosis not present

## 2021-11-21 DIAGNOSIS — I493 Ventricular premature depolarization: Secondary | ICD-10-CM | POA: Diagnosis not present

## 2021-11-21 DIAGNOSIS — R0602 Shortness of breath: Secondary | ICD-10-CM | POA: Diagnosis not present

## 2021-11-22 ENCOUNTER — Other Ambulatory Visit: Payer: Self-pay

## 2021-11-22 DIAGNOSIS — I252 Old myocardial infarction: Secondary | ICD-10-CM | POA: Diagnosis not present

## 2021-11-22 DIAGNOSIS — Z48812 Encounter for surgical aftercare following surgery on the circulatory system: Secondary | ICD-10-CM | POA: Diagnosis not present

## 2021-11-22 DIAGNOSIS — Z955 Presence of coronary angioplasty implant and graft: Secondary | ICD-10-CM | POA: Diagnosis not present

## 2021-11-22 DIAGNOSIS — I214 Non-ST elevation (NSTEMI) myocardial infarction: Secondary | ICD-10-CM

## 2021-11-22 NOTE — Progress Notes (Signed)
Daily Session Note  Patient Details  Name: Gary Mueller MRN: [id number] Date of Birth: 09-13-1941 Referring Provider:   Flowsheet Row Cardiac Rehab from 11/14/2021 in [facility]  Referring Provider Paraschos       Encounter Date: 11/22/2021  Check In:  Session Check In - 11/22/21 0956       Check-In   Supervising physician immediately available to respond to emergencies See telemetry face sheet for immediately available ER MD    Location [facility]-Cardiac & Pulmonary Rehab    Staff Present Birdie Sons, MPA, RN;Amanda Oletta Darter, BA, ACSM CEP, Exercise Physiologist;Jessica Luan Pulling, MA, RCEP, CCRP, CCET    Virtual Visit No    Medication changes reported     Yes    Comments off of brilinta, now taking plavix    Fall or balance concerns reported    No    Warm-up and Cool-down Performed on first and last piece of equipment    Resistance Training Performed Yes    VAD Patient? No    PAD/SET Patient? No      Pain Assessment   Currently in Pain? No/denies                Social History   Tobacco Use  Smoking Status Former   Packs/day: 1.50   Types: Cigarettes   Quit date: 12/24/1964   Years since quitting: 56.9  Smokeless Tobacco Never    Goals Met:  Independence with exercise equipment Exercise tolerated well No report of concerns or symptoms today Strength training completed today  Goals Unmet:  Not Applicable  Comments: First full day of exercise!  Patient was oriented to gym and equipment including functions, settings, policies, and procedures.  Patient's individual exercise prescription and treatment plan were reviewed.  All starting workloads were established based on the results of the 6 minute walk test done at initial orientation visit.  The plan for exercise progression was also introduced and progression will be customized based on patient's performance and goals.     Dr. Emily Filbert is Medical Director for [place].  Dr. Ottie Glazier is Medical Director for [facility].

## 2021-11-23 ENCOUNTER — Encounter: Payer: Self-pay | Admitting: *Deleted

## 2021-11-23 DIAGNOSIS — Z955 Presence of coronary angioplasty implant and graft: Secondary | ICD-10-CM

## 2021-11-23 DIAGNOSIS — I214 Non-ST elevation (NSTEMI) myocardial infarction: Secondary | ICD-10-CM

## 2021-11-23 NOTE — Progress Notes (Signed)
Cardiac Individual Treatment Plan  Patient Details  Name: Gary Mueller MRN: [id number] Date of Birth: 12-25-1941 Referring Provider:   Flowsheet Row Cardiac Rehab from 11/14/2021 in [facility] Cardiac and Pulmonary Rehab  Referring Provider Paraschos       Initial Encounter Date:  Flowsheet Row Cardiac Rehab from 11/14/2021 in [facility] Cardiac and Pulmonary Rehab  Date 11/14/21       Visit Diagnosis: NSTEMI (non-ST elevated myocardial infarction) [facility])  Status post coronary artery stent placement  Patient's Home Medications on Admission:  Current Outpatient Medications:    acetaminophen (TYLENOL) 500 MG tablet, Take 1,000 mg by mouth daily as needed for moderate pain., Disp: , Rfl:    ascorbic acid (VITAMIN C) 1000 MG tablet, Take 1 tablet by mouth daily., Disp: , Rfl:    aspirin EC 81 MG EC tablet, Take 1 tablet (81 mg total) by mouth daily. Swallow whole., Disp: 30 tablet, Rfl: 0   folic acid (FOLVITE) 1 MG tablet, Take 1 mg by mouth daily., Disp: , Rfl:    levothyroxine (SYNTHROID) 100 MCG tablet, TAKE 1 TABLET EVERY DAY ON EMPTY STOMACHWITH A GLASS OF WATER AT LEAST 30-60 MINBEFORE BREAKFAST, Disp: 90 tablet, Rfl: 0   lisinopril (ZESTRIL) 5 MG tablet, Take 1 tablet (5 mg total) by mouth daily., Disp: 30 tablet, Rfl: 0   methotrexate 2.5 MG tablet, Take 15 mg by mouth every Wednesday., Disp: , Rfl:    metoprolol succinate (TOPROL-XL) 25 MG 24 hr tablet, Take 0.5 tablets (12.5 mg total) by mouth every evening., Disp: 15 tablet, Rfl: 0   Multiple Vitamin (MULTIVITAMIN WITH MINERALS) TABS tablet, Take 1 tablet by mouth every evening. , Disp: , Rfl:    nitroGLYCERIN (NITROSTAT) 0.4 MG SL tablet, Place 1 tablet (0.4 mg total) under the tongue every 5 (five) minutes as needed for chest pain., Disp: 30 tablet, Rfl: 0   pantoprazole (PROTONIX) 40 MG tablet, TAKE ONE TABLET (40 MG) BY MOUTH EVERY DAY, Disp: 90 tablet, Rfl: 1   rosuvastatin (CRESTOR) 40 MG tablet, Take 1 tablet (40 mg total) by  mouth daily., Disp: 90 tablet, Rfl: 1   ticagrelor (BRILINTA) 90 MG TABS tablet, Take 1 tablet (90 mg total) by mouth 2 (two) times daily., Disp: 60 tablet, Rfl: 0   vitamin E 180 MG (400 UNITS) capsule, Take 1 capsule by mouth daily., Disp: , Rfl:   Past Medical History: Past Medical History:  Diagnosis Date   Allergy    Anxiety    Arthritis    Cancer ([place]) 10/20/2016   Basal Cell Skin Cancer; lip, neck   Cystoid macular edema of left eye 07/07/2019   Depression    Dysrhythmia    A FIB   GERD (gastroesophageal reflux disease)    HOH (hard of hearing)    Bilateral   Hyperlipidemia    Hypertension    Patient denies   Hypothyroidism    IBS (irritable bowel syndrome)    Inguinal hernia    bilateral   Pneumonia    Thyroid disease    Tinnitus of both ears     Tobacco Use: Social History   Tobacco Use  Smoking Status Former   Packs/day: 1.50   Types: Cigarettes   Quit date: 12/24/1964   Years since quitting: 56.9  Smokeless Tobacco Never    Labs: Recent Review Flowsheet Data     Labs for ITP Cardiac and Pulmonary Rehab Latest Ref Rng & Units 01/10/2018 06/16/2019 01/28/2020 02/01/2021 10/26/2021   Cholestrol 100 -  199 mg/dL 212(H) 150 193 201(H) 154   LDLCALC 0 - 99 mg/dL 121(H) 81 134(H) 139(H) 99   HDL >39 mg/dL 33(L) 49 40 40 38(L)   Trlycerides 0 - 149 mg/dL 290(H) 101 103 119 89        Exercise Target Goals: Exercise Program Goal: Individual exercise prescription set using results from initial 6 min walk test and THRR while considering  patient's activity barriers and safety.   Exercise Prescription Goal: Initial exercise prescription builds to 30-45 minutes a day of aerobic activity, 2-3 days per week.  Home exercise guidelines will be given to patient during program as part of exercise prescription that the participant will acknowledge.   Education: Aerobic Exercise: - Group verbal and visual presentation on the components of exercise prescription.  Introduces F.I.T.T principle from ACSM for exercise prescriptions.  Reviews F.I.T.T. principles of aerobic exercise including progression. Written material given at graduation.   Education: Resistance Exercise: - Group verbal and visual presentation on the components of exercise prescription. Introduces F.I.T.T principle from ACSM for exercise prescriptions  Reviews F.I.T.T. principles of resistance exercise including progression. Written material given at graduation.    Education: Exercise & Equipment Safety: - Individual verbal instruction and demonstration of equipment use and safety with use of the equipment. Flowsheet Row Cardiac Rehab from 11/14/2021 in [facility] Cardiac and Pulmonary Rehab  Date 11/14/21  Educator AS  Instruction Review Code 1- Verbalizes Understanding       Education: Exercise Physiology & General Exercise Guidelines: - Group verbal and written instruction with models to review the exercise physiology of the cardiovascular system and associated critical values. Provides general exercise guidelines with specific guidelines to those with heart or lung disease.  Flowsheet Row Cardiac Rehab from 11/14/2021 in [facility] Cardiac and Pulmonary Rehab  Education need identified 11/14/21       Education: Flexibility, Balance, Mind/Body Relaxation: - Group verbal and visual presentation with interactive activity on the components of exercise prescription. Introduces F.I.T.T principle from ACSM for exercise prescriptions. Reviews F.I.T.T. principles of flexibility and balance exercise training including progression. Also discusses the mind body connection.  Reviews various relaxation techniques to help reduce and manage stress (i.e. Deep breathing, progressive muscle relaxation, and visualization). Balance handout provided to take home. Written material given at graduation.   Activity Barriers & Risk Stratification:  Activity Barriers & Cardiac Risk Stratification - 10/28/21 1444        Activity Barriers & Cardiac Risk Stratification   Activity Barriers Left Knee Replacement;Right Knee Replacement    Cardiac Risk Stratification Moderate             6 Minute Walk:  6 Minute Walk     Row Name 11/14/21 1549         6 Minute Walk   Phase Initial     Distance 1205 feet     Walk Time 6 minutes     # of Rest Breaks 0     MPH 2.28     METS 2.1     RPE 11     Perceived Dyspnea  1     VO2 Peak 7.4     Symptoms No     Resting HR 60 bpm     Resting BP 104/60     Resting Oxygen Saturation  98 %     Exercise Oxygen Saturation  during 6 min walk 97 %     Max Ex. HR 76 bpm     Max Ex. BP 132/60  2 Minute Post BP 114/58              Oxygen Initial Assessment:   Oxygen Re-Evaluation:   Oxygen Discharge (Final Oxygen Re-Evaluation):   Initial Exercise Prescription:  Initial Exercise Prescription - 11/14/21 1500       Date of Initial Exercise RX and Referring Provider   Date 11/14/21    Referring Provider Paraschos      Oxygen   Maintain Oxygen Saturation 88% or higher      Treadmill   MPH 2    Grade 0.5    Minutes 15    METs 2.6      NuStep   Level 2    SPM 80    Minutes 15    METs 2.1      Recumbant Elliptical   Level 1    RPM 50    Minutes 15    METs 2.1      Biostep-RELP   Level 2    SPM 50    Minutes 15    METs 2      Prescription Details   Frequency (times per week) 3    Duration Progress to 30 minutes of continuous aerobic without signs/symptoms of physical distress      Intensity   THRR 40-80% of Max Heartrate 91-122    Ratings of Perceived Exertion 11-13    Perceived Dyspnea 0-4      Resistance Training   Training Prescription Yes    Weight 3 lb    Reps 10-15             Perform Capillary Blood Glucose checks as needed.  Exercise Prescription Changes:   Exercise Prescription Changes     Row Name 11/14/21 1600             Response to Exercise   Blood Pressure (Admit) 104/60        Blood Pressure (Exercise) 132/60       Blood Pressure (Exit) 114/58       Heart Rate (Admit) 60 bpm       Heart Rate (Exercise) 76 bpm       Heart Rate (Exit) 61 bpm       Oxygen Saturation (Admit) 98 %       Oxygen Saturation (Exercise) 97 %       Rating of Perceived Exertion (Exercise) 11       Perceived Dyspnea (Exercise) 1       Symptoms none                Exercise Comments:   Exercise Comments     Row Name 11/22/21 0957           Exercise Comments First full day of exercise!  Patient was oriented to gym and equipment including functions, settings, policies, and procedures.  Patient's individual exercise prescription and treatment plan were reviewed.  All starting workloads were established based on the results of the 6 minute walk test done at initial orientation visit.  The plan for exercise progression was also introduced and progression will be customized based on patient's performance and goals.                Exercise Goals and Review:   Exercise Goals     Row Name 11/14/21 1600             Exercise Goals   Increase Physical Activity Yes       Intervention Provide advice, education, support  and counseling about physical activity/exercise needs.;Develop an individualized exercise prescription for aerobic and resistive training based on initial evaluation findings, risk stratification, comorbidities and participant's personal goals.       Expected Outcomes Short Term: Attend rehab on a regular basis to increase amount of physical activity.;Long Term: Add in home exercise to make exercise part of routine and to increase amount of physical activity.;Long Term: Exercising regularly at least 3-5 days a week.       Increase Strength and Stamina Yes       Intervention Provide advice, education, support and counseling about physical activity/exercise needs.;Develop an individualized exercise prescription for aerobic and resistive training based on initial evaluation  findings, risk stratification, comorbidities and participant's personal goals.       Expected Outcomes Short Term: Increase workloads from initial exercise prescription for resistance, speed, and METs.;Short Term: Perform resistance training exercises routinely during rehab and add in resistance training at home;Long Term: Improve cardiorespiratory fitness, muscular endurance and strength as measured by increased METs and functional capacity (6MWT)       Able to understand and use rate of perceived exertion (RPE) scale Yes       Intervention Provide education and explanation on how to use RPE scale       Expected Outcomes Short Term: Able to use RPE daily in rehab to express subjective intensity level;Long Term:  Able to use RPE to guide intensity level when exercising independently       Able to understand and use Dyspnea scale Yes       Intervention Provide education and explanation on how to use Dyspnea scale       Expected Outcomes Short Term: Able to use Dyspnea scale daily in rehab to express subjective sense of shortness of breath during exertion;Long Term: Able to use Dyspnea scale to guide intensity level when exercising independently       Knowledge and understanding of Target Heart Rate Range (THRR) Yes       Intervention Provide education and explanation of THRR including how the numbers were predicted and where they are located for reference       Expected Outcomes Short Term: Able to state/look up THRR;Short Term: Able to use daily as guideline for intensity in rehab;Long Term: Able to use THRR to govern intensity when exercising independently       Able to check pulse independently Yes       Intervention Provide education and demonstration on how to check pulse in carotid and radial arteries.;Review the importance of being able to check your own pulse for safety during independent exercise       Expected Outcomes Short Term: Able to explain why pulse checking is important during  independent exercise;Long Term: Able to check pulse independently and accurately       Understanding of Exercise Prescription Yes       Intervention Provide education, explanation, and written materials on patient's individual exercise prescription       Expected Outcomes Short Term: Able to explain program exercise prescription;Long Term: Able to explain home exercise prescription to exercise independently                Exercise Goals Re-Evaluation :  Exercise Goals Re-Evaluation     [place] Name 11/22/21 0957             Exercise Goal Re-Evaluation   Exercise Goals Review Increase Physical Activity;Able to understand and use rate of perceived exertion (RPE) scale;Knowledge and understanding of  Target Heart Rate Range (THRR);Understanding of Exercise Prescription;Increase Strength and Stamina;Able to understand and use Dyspnea scale;Able to check pulse independently       Comments Reviewed RPE and dyspnea scales, THR and program prescription with pt today.  Pt voiced understanding and was given a copy of goals to take home.       Expected Outcomes Short: Use RPE daily to regulate intensity. Long: Follow program prescription in THR.                Discharge Exercise Prescription (Final Exercise Prescription Changes):  Exercise Prescription Changes - 11/14/21 1600       Response to Exercise   Blood Pressure (Admit) 104/60    Blood Pressure (Exercise) 132/60    Blood Pressure (Exit) 114/58    Heart Rate (Admit) 60 bpm    Heart Rate (Exercise) 76 bpm    Heart Rate (Exit) 61 bpm    Oxygen Saturation (Admit) 98 %    Oxygen Saturation (Exercise) 97 %    Rating of Perceived Exertion (Exercise) 11    Perceived Dyspnea (Exercise) 1    Symptoms none             Nutrition:  Target Goals: Understanding of nutrition guidelines, daily intake of sodium 1500mg , cholesterol 200mg , calories 30% from fat and 7% or less from saturated fats, daily to have 5 or more servings of fruits  and vegetables.  Education: All About Nutrition: -Group instruction provided by verbal, written material, interactive activities, discussions, models, and posters to present general guidelines for heart healthy nutrition including fat, fiber, MyPlate, the role of sodium in heart healthy nutrition, utilization of the nutrition label, and utilization of this knowledge for meal planning. Follow up email sent as well. Written material given at graduation.   Biometrics:  Pre Biometrics - 11/14/21 1601       Pre Biometrics   Height 5\' 10"  (1.778 m)    Weight 168 lb 1.6 oz (76.2 kg)    BMI (Calculated) 24.12    Single Leg Stand 2.2 seconds              Nutrition Therapy Plan and Nutrition Goals:   Nutrition Assessments:  MEDIFICTS Score Key: ?70 Need to make dietary changes  40-70 Heart Healthy Diet ? 40 Therapeutic Level Cholesterol Diet  Flowsheet Row Cardiac Rehab from 11/14/2021 in [facility] Cardiac and Pulmonary Rehab  Picture Your Plate Total Score on Admission 67      Picture Your Plate Scores: <85 Unhealthy dietary pattern with much room for improvement. 41-50 Dietary pattern unlikely to meet recommendations for good health and room for improvement. 51-60 More healthful dietary pattern, with some room for improvement.  >60 Healthy dietary pattern, although there may be some specific behaviors that could be improved.    Nutrition Goals Re-Evaluation:   Nutrition Goals Discharge (Final Nutrition Goals Re-Evaluation):   Psychosocial: Target Goals: Acknowledge presence or absence of significant depression and/or stress, maximize coping skills, provide positive support system. Participant is able to verbalize types and ability to use techniques and skills needed for reducing stress and depression.   Education: Stress, Anxiety, and Depression - Group verbal and visual presentation to define topics covered.  Reviews how body is impacted by stress, anxiety, and depression.   Also discusses healthy ways to reduce stress and to treat/manage anxiety and depression.  Written material given at graduation.   Education: Sleep Hygiene -Provides group verbal and written instruction about how sleep can affect your health.  Define sleep hygiene, discuss sleep cycles and impact of sleep habits. Review good sleep hygiene tips.    Initial Review & Psychosocial Screening:  Initial Psych Review & Screening - 10/28/21 1448       Initial Review   Current issues with Current Stress Concerns    Source of Stress Concerns Family    Comments son passed 09/06/21; handicapped son still lives at home      [place]? Yes   daughter, neighbors     Barriers   Psychosocial barriers to participate in program The patient should benefit from training in stress management and relaxation.      Screening Interventions   Interventions Encouraged to exercise;Provide feedback about the scores to participant;To provide support and resources with identified psychosocial needs    Expected Outcomes Short Term goal: Utilizing psychosocial counselor, staff and physician to assist with identification of specific Stressors or current issues interfering with healing process. Setting desired goal for each stressor or current issue identified.;Long Term Goal: Stressors or current issues are controlled or eliminated.;Long Term goal: The participant improves quality of Life and PHQ9 Scores as seen by post scores and/or verbalization of changes;Short Term goal: Identification and review with participant of any Quality of Life or Depression concerns found by scoring the questionnaire.             Quality of Life Scores:   Quality of Life - 11/14/21 1608       Quality of Life   Select Quality of Life      Quality of Life Scores   Health/Function Pre 18.6 %    Socioeconomic Pre 26.25 %    Psych/Spiritual Pre 25.71 %    Family Pre 22.2 %    GLOBAL Pre 22.29 %             Scores of 19 and below usually indicate a poorer quality of life in these areas.  A difference of  2-3 points is a clinically meaningful difference.  A difference of 2-3 points in the total score of the Quality of Life Index has been associated with significant improvement in overall quality of life, self-image, physical symptoms, and general health in studies assessing change in quality of life.  PHQ-9: Recent Review Flowsheet Data     Depression screen [facility] 2/9 11/14/2021 10/25/2021 01/31/2021 06/29/2020 02/07/2019   Decreased Interest 0 1 0 0 0   Down, Depressed, Hopeless 1 1 1  0 1   PHQ - 2 Score 1 2 1  0 1   Altered sleeping 0 1 0 - -   Tired, decreased energy 2 3 1  - -   Change in appetite 1 1 0 - -   Feeling bad or failure about yourself  0 0 0 - -   Trouble concentrating 0 0 0 - -   Moving slowly or fidgety/restless 0 0 0 - -   Suicidal thoughts 0 0 0 - -   PHQ-9 Score 4 7 2  - -   Difficult doing work/chores Somewhat difficult Somewhat difficult Not difficult at all - -      Interpretation of Total Score  Total Score Depression Severity:  1-4 = Minimal depression, 5-9 = Mild depression, 10-14 = Moderate depression, 15-19 = Moderately severe depression, 20-27 = Severe depression   Psychosocial Evaluation and Intervention:  Psychosocial Evaluation - 10/28/21 1500       Psychosocial Evaluation & Interventions   Interventions Encouraged to exercise with the program  and follow exercise prescription;Stress management education    Comments Rush Landmark reports feeling well 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 Prior to his hospitalization, he was walking 8-10,000 steps a day and golfing three times a week. He wants to get back to that and is trying to work his way up to it. When asked about stress, he replied that he guesses he is under some and his doctor thinks he is as well. His 64 year old son passed away 09/29/21 so he is still dealing with that. His daughter lives local and his handicapped son lives with him.  He remembers brining his wife, who has now passed away, to [employer] and thinks this program will be good for him and will give him guidance.    Expected Outcomes Short: attend Cardiac Rehab for education and exercise. Long: develop and maintain positive self care habits.    Continue Psychosocial Services  Follow up required by staff             Psychosocial Re-Evaluation:   Psychosocial Discharge (Final Psychosocial Re-Evaluation):   Vocational Rehabilitation: Provide vocational rehab assistance to qualifying candidates.   Vocational Rehab Evaluation & Intervention:  Vocational Rehab - 10/28/21 1448       Initial Vocational Rehab Evaluation & Intervention   Assessment shows need for Vocational Rehabilitation No             Education: Education Goals: Education classes will be provided on a variety of topics geared toward better understanding of heart health and risk factor modification. Participant will state understanding/return demonstration of topics presented as noted by education test scores.  Learning Barriers/Preferences:  Learning Barriers/Preferences - 10/28/21 1448       Learning Barriers/Preferences   Learning Barriers None    Learning Preferences None             General Cardiac Education Topics:  AED/CPR: - Group verbal and written instruction with the use of models to demonstrate the basic use of the AED with the basic ABC's of resuscitation.   Anatomy and Cardiac Procedures: - Group verbal and visual presentation and models provide information about basic cardiac anatomy and function. Reviews the testing methods done to diagnose heart disease and the outcomes of the test results. Describes the treatment choices: Medical Management, Angioplasty, or Coronary Bypass Surgery for treating various heart conditions including Myocardial Infarction, Angina, Valve Disease, and Cardiac Arrhythmias.  Written material given at graduation.   Medication  Safety: - Group verbal and visual instruction to review commonly prescribed medications for heart and lung disease. Reviews the medication, class of the drug, and side effects. Includes the steps to properly store meds and maintain the prescription regimen.  Written material given at graduation.   Intimacy: - Group verbal instruction through game format to discuss how heart and lung disease can affect sexual intimacy. Written material given at graduation..   Know Your Numbers and Heart Failure: - Group verbal and visual instruction to discuss disease risk factors for cardiac and pulmonary disease and treatment options.  Reviews associated critical values for Overweight/Obesity, Hypertension, Cholesterol, and Diabetes.  Discusses basics of heart failure: signs/symptoms and treatments.  Introduces Heart Failure Zone chart for action plan for heart failure.  Written material given at graduation.   Infection Prevention: - Provides verbal and written material to individual with discussion of infection control including proper hand washing and proper equipment cleaning during exercise session. Flowsheet Row Cardiac Rehab from 11/14/2021 in [facility] Cardiac and Pulmonary Rehab  Date 11/14/21  Educator  AS  Instruction Review Code 1- Verbalizes Understanding       Falls Prevention: - Provides verbal and written material to individual with discussion of falls prevention and safety. Flowsheet Row Cardiac Rehab from 11/14/2021 in [facility] Cardiac and Pulmonary Rehab  Date 11/14/21  Educator AS  Instruction Review Code 1- Verbalizes Understanding       Other: -Provides group and verbal instruction on various topics (see comments)   Knowledge Questionnaire Score:  Knowledge Questionnaire Score - 11/14/21 1609       Knowledge Questionnaire Score   Pre Score 24/26             Core Components/Risk Factors/Patient Goals at Admission:  Personal Goals and Risk Factors at Admission - 11/14/21  1602       Core Components/Risk Factors/Patient Goals on Admission   Hypertension Yes    Intervention Provide education on lifestyle modifcations including regular physical activity/exercise, weight management, moderate sodium restriction and increased consumption of fresh fruit, vegetables, and low fat dairy, alcohol moderation, and smoking cessation.;Monitor prescription use compliance.    Expected Outcomes Short Term: Continued assessment and intervention until BP is < 140/11mm HG in hypertensive participants. < 130/45mm HG in hypertensive participants with diabetes, heart failure or chronic kidney disease.;Long Term: Maintenance of blood pressure at goal levels.    Lipids Yes    Intervention Provide education and support for participant on nutrition & aerobic/resistive exercise along with prescribed medications to achieve LDL 70mg , HDL >40mg .    Expected Outcomes Short Term: Participant states understanding of desired cholesterol values and is compliant with medications prescribed. Participant is following exercise prescription and nutrition guidelines.;Long Term: Cholesterol controlled with medications as prescribed, with individualized exercise RX and with personalized nutrition plan. Value goals: LDL < 70mg , HDL > 40 mg.             Education:Diabetes - Individual verbal and written instruction to review signs/symptoms of diabetes, desired ranges of glucose level fasting, after meals and with exercise. Acknowledge that pre and post exercise glucose checks will be done for 3 sessions at entry of program.   Core Components/Risk Factors/Patient Goals Review:    Core Components/Risk Factors/Patient Goals at Discharge (Final Review):    ITP Comments:  ITP Comments     Row Name 10/28/21 1444 11/14/21 1605 11/22/21 0957 11/23/21 0618     ITP Comments Initial telephone orientation completed. Documentation can be found in [facility] 10/25. EP orientation scheduled for Monday 11/21 at 3pm.  Completed 6MWT and gym orientation. Initial ITP created and sent for review to Dr.Mark Sabra Heck, Medical Director. First full day of exercise!  Patient was oriented to gym and equipment including functions, settings, policies, and procedures.  Patient's individual exercise prescription and treatment plan were reviewed.  All starting workloads were established based on the results of the 6 minute walk test done at initial orientation visit.  The plan for exercise progression was also introduced and progression will be customized based on patient's performance and goals. 30 Day review completed. Medical Director ITP review done, changes made as directed, and signed approval by Medical Director.   New to program             Comments:

## 2021-11-24 ENCOUNTER — Other Ambulatory Visit: Payer: Self-pay

## 2021-11-24 ENCOUNTER — Encounter: Payer: Medicare Other | Attending: Cardiology

## 2021-11-24 DIAGNOSIS — I214 Non-ST elevation (NSTEMI) myocardial infarction: Secondary | ICD-10-CM | POA: Insufficient documentation

## 2021-11-24 DIAGNOSIS — Z955 Presence of coronary angioplasty implant and graft: Secondary | ICD-10-CM | POA: Diagnosis not present

## 2021-11-24 NOTE — Progress Notes (Signed)
Daily Session Note  Patient Details  Name: Gary Mueller MRN: [id number] Date of Birth: 1941-05-10 Referring Provider:   Flowsheet Row Cardiac Rehab from 11/14/2021 in [facility] Cardiac and Pulmonary Rehab  Referring Provider Paraschos       Encounter Date: 11/24/2021  Check In:  Session Check In - 11/24/21 0959       Check-In   Supervising physician immediately available to respond to emergencies See telemetry face sheet for immediately available ER MD    Location [facility]-Cardiac & Pulmonary Rehab    Staff Present Birdie Sons, MPA, Elveria Rising, BA, ACSM CEP, Exercise Physiologist;[facility] Amedeo Plenty, BS, ACSM CEP, Exercise Physiologist;Melissa Caiola, RDN, LDN    Virtual Visit No    Medication changes reported     No    Fall or balance concerns reported    No    Warm-up and Cool-down Performed on first and last piece of equipment    Resistance Training Performed Yes    VAD Patient? No    PAD/SET Patient? No      Pain Assessment   Currently in Pain? No/denies                Social History   Tobacco Use  Smoking Status Former   Packs/day: 1.50   Types: Cigarettes   Quit date: 12/24/1964   Years since quitting: 56.9  Smokeless Tobacco Never    Goals Met:  Independence with exercise equipment Exercise tolerated well No report of concerns or symptoms today Strength training completed today  Goals Unmet:  Not Applicable  Comments: Pt able to follow exercise prescription today without complaint.  Will continue to monitor for progression.    Dr. Emily Filbert is Medical Director for [place].  Dr. Ottie Glazier is Medical Director for [facility] Pulmonary Rehabilitation.

## 2021-11-28 ENCOUNTER — Other Ambulatory Visit: Payer: Self-pay

## 2021-11-28 DIAGNOSIS — I214 Non-ST elevation (NSTEMI) myocardial infarction: Secondary | ICD-10-CM

## 2021-11-28 NOTE — Progress Notes (Signed)
Completed initial RD consultation ?

## 2021-11-29 ENCOUNTER — Other Ambulatory Visit: Payer: Self-pay

## 2021-11-29 ENCOUNTER — Encounter: Payer: Medicare Other | Admitting: *Deleted

## 2021-11-29 DIAGNOSIS — I214 Non-ST elevation (NSTEMI) myocardial infarction: Secondary | ICD-10-CM | POA: Diagnosis not present

## 2021-11-29 DIAGNOSIS — Z955 Presence of coronary angioplasty implant and graft: Secondary | ICD-10-CM

## 2021-11-29 NOTE — Progress Notes (Signed)
Daily Session Note  Patient Details  Name: Gary Mueller MRN: [id number] Date of Birth: 12-07-1941 Referring Provider:   Flowsheet Row Cardiac Rehab from 11/14/2021 in [facility] Cardiac and Pulmonary Rehab  Referring Provider Paraschos       Encounter Date: 11/29/2021  Check In:  Session Check In - 11/29/21 1154       Check-In   Supervising physician immediately available to respond to emergencies See telemetry face sheet for immediately available ER MD    Location [facility]Cardiac & Pulmonary Rehab    Staff Present Nyoka Cowden, RN, BSN, Ardeth Sportsman, RDN, Rowe Pavy, BA, ACSM CEP, Exercise Physiologist    Virtual Visit No    Medication changes reported     No    Fall or balance concerns reported    No    Tobacco Cessation No Change    Warm-up and Cool-down Performed on first and last piece of equipment    Resistance Training Performed Yes    VAD Patient? No    PAD/SET Patient? No      Pain Assessment   Currently in Pain? No/denies                Social History   Tobacco Use  Smoking Status Former   Packs/day: 1.50   Types: Cigarettes   Quit date: 12/24/1964   Years since quitting: 56.9  Smokeless Tobacco Never    Goals Met:  Independence with exercise equipment Exercise tolerated well No report of concerns or symptoms today  Goals Unmet:  Not Applicable  Comments: Pt able to follow exercise prescription today without complaint.  Will continue to monitor for progression.    Dr. Emily Filbert is Medical Director for [place].  Dr. Ottie Glazier is Medical Director for [facility] Pulmonary Rehabilitation.

## 2021-12-01 ENCOUNTER — Other Ambulatory Visit: Payer: Self-pay

## 2021-12-01 DIAGNOSIS — Z955 Presence of coronary angioplasty implant and graft: Secondary | ICD-10-CM | POA: Diagnosis not present

## 2021-12-01 DIAGNOSIS — I214 Non-ST elevation (NSTEMI) myocardial infarction: Secondary | ICD-10-CM

## 2021-12-01 NOTE — Progress Notes (Signed)
Daily Session Note  Patient Details  Name: Gary Mueller MRN: [id number] Date of Birth: 05/19/1941 Referring Provider:   Flowsheet Row Cardiac Rehab from 11/14/2021 in [facility] Cardiac and Pulmonary Rehab  Referring Provider Paraschos       Encounter Date: 12/01/2021  Check In:  Session Check In - 12/01/21 0953       Check-In   Supervising physician immediately available to respond to emergencies See telemetry face sheet for immediately available ER MD    Location [facility]Cardiac & Pulmonary Rehab    Staff Present Kelly Bollinger, MPA, RN;Amanda Sommer, BA, ACSM CEP, Exercise Physiologist;Kelly Hayes, BS, ACSM CEP, Exercise Physiologist;Melissa Caiola, RDN, LDN    Virtual Visit No    Medication changes reported     Yes    Comments off lisinopril due to cough, med has not been replaced with other med yet.    Fall or balance concerns reported    No    Tobacco Cessation No Change    Warm-up and Cool-down Performed on first and last piece of equipment    Resistance Training Performed Yes    VAD Patient? No    PAD/SET Patient? No      Pain Assessment   Currently in Pain? No/denies                Social History   Tobacco Use  Smoking Status Former   Packs/day: 1.50   Types: Cigarettes   Quit date: 12/24/1964   Years since quitting: 56.9  Smokeless Tobacco Never    Goals Met:  Independence with exercise equipment Exercise tolerated well No report of concerns or symptoms today Strength training completed today  Goals Unmet:  Not Applicable  Comments: Pt able to follow exercise prescription today without complaint.  Will continue to monitor for progression.    Dr. Mark Miller is Medical Director for [facility].  Dr. Fuad Aleskerov is Medical Director for [facility] Pulmonary Rehabilitation. 

## 2021-12-06 ENCOUNTER — Other Ambulatory Visit: Payer: Self-pay

## 2021-12-06 DIAGNOSIS — M1990 Unspecified osteoarthritis, unspecified site: Secondary | ICD-10-CM | POA: Diagnosis not present

## 2021-12-06 DIAGNOSIS — Z79899 Other long term (current) drug therapy: Secondary | ICD-10-CM | POA: Diagnosis not present

## 2021-12-06 DIAGNOSIS — I214 Non-ST elevation (NSTEMI) myocardial infarction: Secondary | ICD-10-CM

## 2021-12-06 DIAGNOSIS — Z955 Presence of coronary angioplasty implant and graft: Secondary | ICD-10-CM

## 2021-12-06 DIAGNOSIS — M159 Polyosteoarthritis, unspecified: Secondary | ICD-10-CM | POA: Diagnosis not present

## 2021-12-06 NOTE — Progress Notes (Signed)
Daily Session Note  Patient Details  Name: LAQUAN BEIER MRN: [id number] Date of Birth: [birth year] Referring Provider:   Flowsheet Row Cardiac Rehab from 11/14/2021 in [facility] Cardiac and Pulmonary Rehab  Referring Provider Paraschos       Encounter Date: 12/06/2021  Check In:  Session Check In - 12/06/21 0957       Check-In   Supervising physician immediately available to respond to emergencies See telemetry face sheet for immediately available ER MD    Location [facility]-Cardiac & Pulmonary Rehab    Staff Present Birdie Sons, MPA, RN;Jessica Luan Pulling, MA, RCEP, CCRP, CCET;Amanda Sommer, BA, ACSM CEP, Exercise Physiologist    Virtual Visit No    Medication changes reported     No    Fall or balance concerns reported    No    Tobacco Cessation No Change    Warm-up and Cool-down Performed on first and last piece of equipment    Resistance Training Performed Yes    VAD Patient? No    PAD/SET Patient? No      Pain Assessment   Currently in Pain? No/denies                Social History   Tobacco Use  Smoking Status Former   Packs/day: 1.50   Types: Cigarettes   Quit date: 12/24/1964   Years since quitting: 56.9  Smokeless Tobacco Never    Goals Met:  Independence with exercise equipment Exercise tolerated well No report of concerns or symptoms today Strength training completed today  Goals Unmet:  Not Applicable  Comments: Pt able to follow exercise prescription today without complaint.  Will continue to monitor for progression.  Reviewed home exercise with pt today.  Pt plans to continue to walk with dog at home for exercise.  [place] also has a weight routine that he continues to do daily.  Reviewed THR, pulse, RPE, sign and symptoms, pulse oximetery and when to call 911 or MD.  Also discussed weather considerations and indoor options.  Pt voiced understanding.   Dr. Emily Filbert is Medical Director for [place].  Dr. Ottie Glazier  is Medical Director for [facility] Pulmonary Rehabilitation.

## 2021-12-08 ENCOUNTER — Other Ambulatory Visit: Payer: Self-pay

## 2021-12-08 DIAGNOSIS — Z955 Presence of coronary angioplasty implant and graft: Secondary | ICD-10-CM

## 2021-12-08 DIAGNOSIS — I214 Non-ST elevation (NSTEMI) myocardial infarction: Secondary | ICD-10-CM

## 2021-12-08 DIAGNOSIS — Z7901 Long term (current) use of anticoagulants: Secondary | ICD-10-CM | POA: Diagnosis not present

## 2021-12-08 DIAGNOSIS — L03031 Cellulitis of right toe: Secondary | ICD-10-CM | POA: Diagnosis not present

## 2021-12-08 NOTE — Progress Notes (Signed)
Daily Session Note  Patient Details  Name: Gary Mueller MRN: [id number] Date of Birth: [birth year] Referring Provider:   Flowsheet Row Cardiac Rehab from 11/14/2021 in [facility] Cardiac and Pulmonary Rehab  Referring Provider Paraschos       Encounter Date: 12/08/2021  Check In:  Session Check In - 12/08/21 0953       Check-In   Supervising physician immediately available to respond to emergencies See telemetry face sheet for immediately available ER MD    Location [facility]-Cardiac & Pulmonary Rehab    Staff Present Birdie Sons, MPA, RN;Melissa Shiloh, RDN, Tawanna Solo, MS, ASCM CEP, Exercise Physiologist;Amanda Oletta Darter, BA, ACSM CEP, Exercise Physiologist    Virtual Visit No    Medication changes reported     No    Fall or balance concerns reported    No    Tobacco Cessation No Change    Warm-up and Cool-down Performed on first and last piece of equipment    Resistance Training Performed Yes    VAD Patient? No    PAD/SET Patient? No      Pain Assessment   Currently in Pain? No/denies                Social History   Tobacco Use  Smoking Status Former   Packs/day: 1.50   Types: Cigarettes   Quit date: 12/24/1964   Years since quitting: 56.9  Smokeless Tobacco Never    Goals Met:  Independence with exercise equipment Exercise tolerated well No report of concerns or symptoms today Strength training completed today  Goals Unmet:  Not Applicable  Comments: Pt able to follow exercise prescription today without complaint.  Will continue to monitor for progression.    Dr. Emily Filbert is Medical Director for [place].  Dr. Ottie Glazier is Medical Director for [facility] Pulmonary Rehabilitation.

## 2021-12-13 ENCOUNTER — Other Ambulatory Visit: Payer: Self-pay

## 2021-12-13 DIAGNOSIS — Z955 Presence of coronary angioplasty implant and graft: Secondary | ICD-10-CM | POA: Diagnosis not present

## 2021-12-13 DIAGNOSIS — I214 Non-ST elevation (NSTEMI) myocardial infarction: Secondary | ICD-10-CM | POA: Diagnosis not present

## 2021-12-13 NOTE — Progress Notes (Signed)
Daily Session Note  Patient Details  Name: Gary Mueller MRN: [id number] Date of Birth: September 19, 1941 Referring Provider:   Flowsheet Row Cardiac Rehab from 11/14/2021 in [facility] Cardiac and Pulmonary Rehab  Referring Provider Paraschos       Encounter Date: 12/13/2021  Check In:  Session Check In - 12/13/21 8185       Check-In   Supervising physician immediately available to respond to emergencies See telemetry face sheet for immediately available ER MD    Location [facility]Card[facility] Pulmonary Rehab    Staff Present Birdie Sons, MPA, RN;Amanda Oletta Darter, BA, ACSM CEP, Exercise Physiologist;Jessica Navarino, MA, RCEP, CCRP, CCET    Virtual Visit No    Medication changes reported     No    Fall or balance concerns reported    No    Tobacco Cessation No Change    Warm-up and Cool-down Performed on first and last piece of equipment    Resistance Training Performed Yes    VAD Patient? No    PAD/SET Patient? No      Pain Assessment   Currently in Pain? No/denies                Social History   Tobacco Use  Smoking Status Former   Packs/day: 1.50   Types: Cigarettes   Quit date: 12/24/1964   Years since quitting: 57.0  Smokeless Tobacco Never    Goals Met:  Independence with exercise equipment Exercise tolerated well No report of concerns or symptoms today Strength training completed today  Goals Unmet:  Not Applicable  Comments: Pt able to follow exercise prescription today without complaint.  Will continue to monitor for progression.    Dr. Emily Filbert is Medical Director for [place].  Dr. Ottie Glazier is Medical Director for [facility] Pulmonary Rehabilitation.

## 2021-12-14 DIAGNOSIS — D2272 Melanocytic nevi of left lower limb, including hip: Secondary | ICD-10-CM | POA: Diagnosis not present

## 2021-12-14 DIAGNOSIS — D2261 Melanocytic nevi of right upper limb, including shoulder: Secondary | ICD-10-CM | POA: Diagnosis not present

## 2021-12-14 DIAGNOSIS — D2262 Melanocytic nevi of left upper limb, including shoulder: Secondary | ICD-10-CM | POA: Diagnosis not present

## 2021-12-14 DIAGNOSIS — L570 Actinic keratosis: Secondary | ICD-10-CM | POA: Diagnosis not present

## 2021-12-14 DIAGNOSIS — Z85828 Personal history of other malignant neoplasm of skin: Secondary | ICD-10-CM | POA: Diagnosis not present

## 2021-12-14 DIAGNOSIS — D2271 Melanocytic nevi of right lower limb, including hip: Secondary | ICD-10-CM | POA: Diagnosis not present

## 2021-12-14 DIAGNOSIS — X32XXXA Exposure to sunlight, initial encounter: Secondary | ICD-10-CM | POA: Diagnosis not present

## 2021-12-15 ENCOUNTER — Other Ambulatory Visit: Payer: Self-pay

## 2021-12-15 DIAGNOSIS — Z955 Presence of coronary angioplasty implant and graft: Secondary | ICD-10-CM | POA: Diagnosis not present

## 2021-12-15 DIAGNOSIS — I214 Non-ST elevation (NSTEMI) myocardial infarction: Secondary | ICD-10-CM

## 2021-12-15 NOTE — Progress Notes (Signed)
Daily Session Note  Patient Details  Name: Gary Mueller MRN: [id number] Date of Birth: [birth year] Referring Provider:   Flowsheet Row Cardiac Rehab from 11/14/2021 in [facility] Cardiac and Pulmonary Rehab  Referring Provider Paraschos       Encounter Date: 12/15/2021  Check In:  Session Check In - 12/15/21 0940       Check-In   Supervising physician immediately available to respond to emergencies See telemetry face sheet for immediately available ER MD    Location [facility]-Cardiac & Pulmonary Rehab    Staff Present Birdie Sons, MPA, Elveria Rising, BA, ACSM CEP, Exercise Physiologist;Equan Cogbill Amedeo Plenty, BS, ACSM CEP, Exercise Physiologist;Melissa Caiola, RDN, LDN    Virtual Visit No    Medication changes reported     No    Fall or balance concerns reported    No    Tobacco Cessation No Change    Warm-up and Cool-down Performed on first and last piece of equipment    Resistance Training Performed Yes    VAD Patient? No    PAD/SET Patient? No      Pain Assessment   Currently in Pain? No/denies                Social History   Tobacco Use  Smoking Status Former   Packs/day: 1.50   Types: Cigarettes   Quit date: 12/24/1964   Years since quitting: 57.0  Smokeless Tobacco Never    Goals Met:  Independence with exercise equipment Exercise tolerated well No report of concerns or symptoms today Strength training completed today  Goals Unmet:  Not Applicable  Comments: Pt able to follow exercise prescription today without complaint.  Will continue to monitor for progression.    Dr. Emily Filbert is Medical Director for [place].  Dr. Ottie Glazier is Medical Director for [facility] Pulmonary Rehabilitation.

## 2021-12-20 ENCOUNTER — Encounter: Payer: Medicare Other | Admitting: *Deleted

## 2021-12-20 ENCOUNTER — Other Ambulatory Visit: Payer: Self-pay

## 2021-12-20 DIAGNOSIS — I214 Non-ST elevation (NSTEMI) myocardial infarction: Secondary | ICD-10-CM | POA: Diagnosis not present

## 2021-12-20 DIAGNOSIS — Z955 Presence of coronary angioplasty implant and graft: Secondary | ICD-10-CM | POA: Diagnosis not present

## 2021-12-20 NOTE — Progress Notes (Signed)
Daily Session Note  Patient Details  Name: Gary Mueller MRN: [id number] Date of Birth: [birth year] Referring Provider:   Flowsheet Row Cardiac Rehab from 11/14/2021 in [facility] Cardiac and Pulmonary Rehab  Referring Provider Paraschos       Encounter Date: 12/20/2021  Check In:  Session Check In - 12/20/21 1101       Check-In   Supervising physician immediately available to respond to emergencies See telemetry face sheet for immediately available ER MD    Location [facility]-Cardiac & Pulmonary Rehab    Staff Present Heath Lark, RN, BSN, CCRP;Melissa [place], RDN, Rowe Pavy, BA, ACSM CEP, Exercise Physiologist    Virtual Visit No    Medication changes reported     No    Fall or balance concerns reported    No    Warm-up and Cool-down Performed on first and last piece of equipment    Resistance Training Performed Yes    VAD Patient? No    PAD/SET Patient? No      Pain Assessment   Currently in Pain? No/denies                Social History   Tobacco Use  Smoking Status Former   Packs/day: 1.50   Types: Cigarettes   Quit date: 12/24/1964   Years since quitting: 57.0  Smokeless Tobacco Never    Goals Met:  Independence with exercise equipment Exercise tolerated well No report of concerns or symptoms today  Goals Unmet:  Not Applicable  Comments: Pt able to follow exercise prescription today without complaint.  Will continue to monitor for progression.    Dr. Emily Filbert is Medical Director for [place].  Dr. Ottie Glazier is Medical Director for [facility] Pulmonary Rehabilitation.

## 2021-12-21 ENCOUNTER — Encounter: Payer: Self-pay | Admitting: *Deleted

## 2021-12-21 DIAGNOSIS — I214 Non-ST elevation (NSTEMI) myocardial infarction: Secondary | ICD-10-CM

## 2021-12-21 DIAGNOSIS — Z955 Presence of coronary angioplasty implant and graft: Secondary | ICD-10-CM

## 2021-12-21 NOTE — Progress Notes (Signed)
Cardiac Individual Treatment Plan  Patient Details  Name: Gary Mueller MRN: [id number] Date of Birth: 01/16/1941 Referring Provider:   Flowsheet Row Cardiac Rehab from 11/14/2021 in [facility] Cardiac and Pulmonary Rehab  Referring Provider Paraschos       Initial Encounter Date:  Flowsheet Row Cardiac Rehab from 11/14/2021 in [facility] Cardiac and Pulmonary Rehab  Date 11/14/21       Visit Diagnosis: NSTEMI (non-ST elevated myocardial infarction) [facility])  Status post coronary artery stent placement  Patient's Home Medications on Admission:  Current Outpatient Medications:    acetaminophen (TYLENOL) 500 MG tablet, Take 1,000 mg by mouth daily as needed for moderate pain., Disp: , Rfl:    ascorbic acid (VITAMIN C) 1000 MG tablet, Take 1 tablet by mouth daily., Disp: , Rfl:    aspirin EC 81 MG EC tablet, Take 1 tablet (81 mg total) by mouth daily. Swallow whole., Disp: 30 tablet, Rfl: 0   folic acid (FOLVITE) 1 MG tablet, Take 1 mg by mouth daily., Disp: , Rfl:    levothyroxine (SYNTHROID) 100 MCG tablet, TAKE 1 TABLET EVERY DAY ON EMPTY STOMACHWITH A GLASS OF WATER AT LEAST 30-60 MINBEFORE BREAKFAST, Disp: 90 tablet, Rfl: 0   lisinopril (ZESTRIL) 5 MG tablet, Take 1 tablet (5 mg total) by mouth daily., Disp: 30 tablet, Rfl: 0   methotrexate 2.5 MG tablet, Take 15 mg by mouth every Wednesday., Disp: , Rfl:    metoprolol succinate (TOPROL-XL) 25 MG 24 hr tablet, Take 0.5 tablets (12.5 mg total) by mouth every evening., Disp: 15 tablet, Rfl: 0   Multiple Vitamin (MULTIVITAMIN WITH MINERALS) TABS tablet, Take 1 tablet by mouth every evening. , Disp: , Rfl:    nitroGLYCERIN (NITROSTAT) 0.4 MG SL tablet, Place 1 tablet (0.4 mg total) under the tongue every 5 (five) minutes as needed for chest pain., Disp: 30 tablet, Rfl: 0   pantoprazole (PROTONIX) 40 MG tablet, TAKE ONE TABLET (40 MG) BY MOUTH EVERY DAY, Disp: 90 tablet, Rfl: 1   rosuvastatin (CRESTOR) 40 MG tablet, Take 1 tablet (40 mg total) by  mouth daily., Disp: 90 tablet, Rfl: 1   ticagrelor (BRILINTA) 90 MG TABS tablet, Take 1 tablet (90 mg total) by mouth 2 (two) times daily., Disp: 60 tablet, Rfl: 0   vitamin E 180 MG (400 UNITS) capsule, Take 1 capsule by mouth daily., Disp: , Rfl:   Past Medical History: Past Medical History:  Diagnosis Date   Allergy    Anxiety    Arthritis    Cancer ([place]) 10/20/2016   Basal Cell Skin Cancer; lip, neck   Cystoid macular edema of left eye 07/07/2019   Depression    Dysrhythmia    A FIB   GERD (gastroesophageal reflux disease)    HOH (hard of hearing)    Bilateral   Hyperlipidemia    Hypertension    Patient denies   Hypothyroidism    IBS (irritable bowel syndrome)    Inguinal hernia    bilateral   Pneumonia    Thyroid disease    Tinnitus of both ears     Tobacco Use: Social History   Tobacco Use  Smoking Status Former   Packs/day: 1.50   Types: Cigarettes   Quit date: 12/24/1964   Years since quitting: 57.0  Smokeless Tobacco Never    Labs: Recent Review Flowsheet Data     Labs for ITP Cardiac and Pulmonary Rehab Latest Ref Rng & Units 01/10/2018 06/16/2019 01/28/2020 02/01/2021 10/26/2021   Cholestrol 100 -  199 mg/dL 212(H) 150 193 201(H) 154   LDLCALC 0 - 99 mg/dL 121(H) 81 134(H) 139(H) 99   HDL >39 mg/dL 33(L) 49 40 40 38(L)   Trlycerides 0 - 149 mg/dL 290(H) 101 103 119 89        Exercise Target Goals: Exercise Program Goal: Individual exercise prescription set using results from initial 6 min walk test and THRR while considering  patients activity barriers and safety.   Exercise Prescription Goal: Initial exercise prescription builds to 30-45 minutes a day of aerobic activity, 2-3 days per week.  Home exercise guidelines will be given to patient during program as part of exercise prescription that the participant will acknowledge.   Education: Aerobic Exercise: - Group verbal and visual presentation on the components of exercise prescription.  Introduces F.I.T.T principle from ACSM for exercise prescriptions.  Reviews F.I.T.T. principles of aerobic exercise including progression. Written material given at graduation.   Education: Resistance Exercise: - Group verbal and visual presentation on the components of exercise prescription. Introduces F.I.T.T principle from ACSM for exercise prescriptions  Reviews F.I.T.T. principles of resistance exercise including progression. Written material given at graduation.    Education: Exercise & Equipment Safety: - Individual verbal instruction and demonstration of equipment use and safety with use of the equipment. Flowsheet Row Cardiac Rehab from 12/15/2021 in [facility] Cardiac and Pulmonary Rehab  Date 11/14/21  Educator AS  Instruction Review Code 1- Verbalizes Understanding       Education: Exercise Physiology & General Exercise Guidelines: - Group verbal and written instruction with models to review the exercise physiology of the cardiovascular system and associated critical values. Provides general exercise guidelines with specific guidelines to those with heart or lung disease.  Flowsheet Row Cardiac Rehab from 12/15/2021 in [facility] Cardiac and Pulmonary Rehab  Education need identified 11/14/21  Date 12/15/21  Educator [facility]  Instruction Review Code 1- [employer] Understanding       Education: Flexibility, Balance, Mind/Body Relaxation: - Group verbal and visual presentation with interactive activity on the components of exercise prescription. Introduces F.I.T.T principle from ACSM for exercise prescriptions. Reviews F.I.T.T. principles of flexibility and balance exercise training including progression. Also discusses the mind body connection.  Reviews various relaxation techniques to help reduce and manage stress (i.e. Deep breathing, progressive muscle relaxation, and visualization). Balance handout provided to take home. Written material given at graduation.   Activity Barriers & Risk  Stratification:  Activity Barriers & Cardiac Risk Stratification - 10/28/21 1444       Activity Barriers & Cardiac Risk Stratification   Activity Barriers Left Knee Replacement;Right Knee Replacement    Cardiac Risk Stratification Moderate             6 Minute Walk:  6 Minute Walk     Row Name 11/14/21 1549         6 Minute Walk   Phase Initial     Distance 1205 feet     Walk Time 6 minutes     # of Rest Breaks 0     MPH 2.28     METS 2.1     RPE 11     Perceived Dyspnea  1     VO2 Peak 7.4     Symptoms No     Resting HR 60 bpm     Resting BP 104/60     Resting Oxygen Saturation  98 %     Exercise Oxygen Saturation  during 6 min walk 97 %  Max Ex. HR 76 bpm     Max Ex. BP 132/60     2 Minute Post BP 114/58              Oxygen Initial Assessment:   Oxygen Re-Evaluation:   Oxygen Discharge (Final Oxygen Re-Evaluation):   Initial Exercise Prescription:  Initial Exercise Prescription - 11/14/21 1500       Date of Initial Exercise RX and Referring Provider   Date 11/14/21    Referring Provider Paraschos      Oxygen   Maintain Oxygen Saturation 88% or higher      Treadmill   MPH 2    Grade 0.5    Minutes 15    METs 2.6      NuStep   Level 2    SPM 80    Minutes 15    METs 2.1      Recumbant Elliptical   Level 1    RPM 50    Minutes 15    METs 2.1      Biostep-RELP   Level 2    SPM 50    Minutes 15    METs 2      Prescription Details   Frequency (times per week) 3    Duration Progress to 30 minutes of continuous aerobic without signs/symptoms of physical distress      Intensity   THRR 40-80% of Max Heartrate 91-122    Ratings of Perceived Exertion 11-13    Perceived Dyspnea 0-4      Resistance Training   Training Prescription Yes    Weight 3 lb    Reps 10-15             Perform Capillary Blood Glucose checks as needed.  Exercise Prescription Changes:   Exercise Prescription Changes     Row Name 11/14/21  1600 12/05/21 1400           Response to Exercise   Blood Pressure (Admit) 104/60 118/64      Blood Pressure (Exercise) 132/60 128/60      Blood Pressure (Exit) 114/58 120/60      Heart Rate (Admit) 60 bpm 57 bpm      Heart Rate (Exercise) 76 bpm 78 bpm      Heart Rate (Exit) 61 bpm 70 bpm      Oxygen Saturation (Admit) 98 % --      Oxygen Saturation (Exercise) 97 % --      Rating of Perceived Exertion (Exercise) 11 12      Perceived Dyspnea (Exercise) 1 --      Symptoms none none      Comments -- fifth day exercise      Duration -- Continue with 30 min of aerobic exercise without signs/symptoms of physical distress.      Intensity -- THRR unchanged        Progression   Progression -- Continue to progress workloads to maintain intensity without signs/symptoms of physical distress.      Average METs -- 2.74        Resistance Training   Training Prescription -- Yes      Weight -- 3 lb      Reps -- 10-15        NuStep   Level -- 4      Minutes -- 15      METs -- 3.3        Biostep-RELP   Level -- 2      Minutes --  15      METs -- 2               Exercise Comments:   Exercise Comments     Row Name 11/22/21 0957           Exercise Comments First full day of exercise!  Patient was oriented to gym and equipment including functions, settings, policies, and procedures.  Patient's individual exercise prescription and treatment plan were reviewed.  All starting workloads were established based on the results of the 6 minute walk test done at initial orientation visit.  The plan for exercise progression was also introduced and progression will be customized based on patient's performance and goals.                Exercise Goals and Review:   Exercise Goals     Row Name 11/14/21 1600             Exercise Goals   Increase Physical Activity Yes       Intervention Provide advice, education, support and counseling about physical activity/exercise  needs.;Develop an individualized exercise prescription for aerobic and resistive training based on initial evaluation findings, risk stratification, comorbidities and participant's personal goals.       Expected Outcomes Short Term: Attend rehab on a regular basis to increase amount of physical activity.;Long Term: Add in home exercise to make exercise part of routine and to increase amount of physical activity.;Long Term: Exercising regularly at least 3-5 days a week.       Increase Strength and Stamina Yes       Intervention Provide advice, education, support and counseling about physical activity/exercise needs.;Develop an individualized exercise prescription for aerobic and resistive training based on initial evaluation findings, risk stratification, comorbidities and participant's personal goals.       Expected Outcomes Short Term: Increase workloads from initial exercise prescription for resistance, speed, and METs.;Short Term: Perform resistance training exercises routinely during rehab and add in resistance training at home;Long Term: Improve cardiorespiratory fitness, muscular endurance and strength as measured by increased METs and functional capacity (6MWT)       Able to understand and use rate of perceived exertion (RPE) scale Yes       Intervention Provide education and explanation on how to use RPE scale       Expected Outcomes Short Term: Able to use RPE daily in rehab to express subjective intensity level;Long Term:  Able to use RPE to guide intensity level when exercising independently       Able to understand and use Dyspnea scale Yes       Intervention Provide education and explanation on how to use Dyspnea scale       Expected Outcomes Short Term: Able to use Dyspnea scale daily in rehab to express subjective sense of shortness of breath during exertion;Long Term: Able to use Dyspnea scale to guide intensity level when exercising independently       Knowledge and understanding of  Target Heart Rate Range (THRR) Yes       Intervention Provide education and explanation of THRR including how the numbers were predicted and where they are located for reference       Expected Outcomes Short Term: Able to state/look up THRR;Short Term: Able to use daily as guideline for intensity in rehab;Long Term: Able to use THRR to govern intensity when exercising independently       Able to check pulse independently Yes  Intervention Provide education and demonstration on how to check pulse in carotid and radial arteries.;Review the importance of being able to check your own pulse for safety during independent exercise       Expected Outcomes Short Term: Able to explain why pulse checking is important during independent exercise;Long Term: Able to check pulse independently and accurately       Understanding of Exercise Prescription Yes       Intervention Provide education, explanation, and written materials on patient's individual exercise prescription       Expected Outcomes Short Term: Able to explain program exercise prescription;Long Term: Able to explain home exercise prescription to exercise independently                Exercise Goals Re-Evaluation :  Exercise Goals Re-Evaluation     Row Name 11/22/21 0957 12/05/21 1401 12/06/21 1007         Exercise Goal Re-Evaluation   Exercise Goals Review Increase Physical Activity;Able to understand and use rate of perceived exertion (RPE) scale;Knowledge and understanding of Target Heart Rate Range (THRR);Understanding of Exercise Prescription;Increase Strength and Stamina;Able to understand and use Dyspnea scale;Able to check pulse independently Increase Physical Activity;Increase Strength and Stamina Increase Physical Activity;Increase Strength and Stamina;Able to understand and use rate of perceived exertion (RPE) scale;Able to understand and use Dyspnea scale;Knowledge and understanding of Target Heart Rate Range (THRR);Able to check  pulse independently;Understanding of Exercise Prescription     Comments Reviewed RPE and dyspnea scales, THR and program prescription with pt today.  Pt voiced understanding and was given a copy of goals to take home. Gary Mueller is tolerating exercise well in his first weeks.  he has increase NS to level 4.  Staff will monitor progress. Gary Mueller has always been active at home with a weight routine and walking his dog.  He also stays busy with mowing the yard and doing other yard work.  Reviewed home exercise with pt today.  Pt plans to continue to walk with dog at home for exercise.  Gary Mueller also has a weight routine that he continues to do daily.  Reviewed THR, pulse, RPE, sign and symptoms, pulse oximetery and when to call 911 or MD.  Also discussed weather considerations and indoor options.  Pt voiced understanding. He really wants to get back to golf again so we talked about the progression from bucket of balls back to the course.     Expected Outcomes Short: Use RPE daily to regulate intensity. Long: Follow program prescription in THR. Short:  attend consistently Long:  build overall stamina Short: Continue to add in walking at home on off days Long; Continue to improve stamina.              Discharge Exercise Prescription (Final Exercise Prescription Changes):  Exercise Prescription Changes - 12/05/21 1400       Response to Exercise   Blood Pressure (Admit) 118/64    Blood Pressure (Exercise) 128/60    Blood Pressure (Exit) 120/60    Heart Rate (Admit) 57 bpm    Heart Rate (Exercise) 78 bpm    Heart Rate (Exit) 70 bpm    Rating of Perceived Exertion (Exercise) 12    Symptoms none    Comments fifth day exercise    Duration Continue with 30 min of aerobic exercise without signs/symptoms of physical distress.    Intensity THRR unchanged      Progression   Progression Continue to progress workloads to maintain intensity without signs/symptoms of physical  distress.    Average METs 2.74       Resistance Training   Training Prescription Yes    Weight 3 lb    Reps 10-15      NuStep   Level 4    Minutes 15    METs 3.3      Biostep-RELP   Level 2    Minutes 15    METs 2             Nutrition:  Target Goals: Understanding of nutrition guidelines, daily intake of sodium <1527m, cholesterol <207m calories 30% from fat and 7% or less from saturated fats, daily to have 5 or more servings of fruits and vegetables.  Education: All About Nutrition: -Group instruction provided by verbal, written material, interactive activities, discussions, models, and posters to present general guidelines for heart healthy nutrition including fat, fiber, MyPlate, the role of sodium in heart healthy nutrition, utilization of the nutrition label, and utilization of this knowledge for meal planning. Follow up email sent as well. Written material given at graduation.   Biometrics:  Pre Biometrics - 11/14/21 1601       Pre Biometrics   Height 5' 10" (1.778 m)    Weight 168 lb 1.6 oz (76.2 kg)    BMI (Calculated) 24.12    Single Leg Stand 2.2 seconds              Nutrition Therapy Plan and Nutrition Goals:  Nutrition Therapy & Goals - 11/28/21 0818       Nutrition Therapy   Diet Heart healthy, low Na    Drug/Food Interactions Statins/Certain Fruits    Protein (specify units) 60g    Fiber 30 grams    Whole Grain Foods 3 servings    Saturated Fats 12 max. grams    Fruits and Vegetables 8 servings/day      Personal Nutrition Goals   Nutrition Goal ST: read lables of ready to eat canned foods for sodium content, make sure having three good sources of protein daily (if none at lunch have a boiled egg with snack), explore different frozen vegeables for variety  LT: limit Na <1.5g/day, eat rainbow of fruits and vegetables each week, meet protein needs.    Comments He prepares many of his own meals, but he is not a good cook; he eats lots of legumes, grains, and fruit/vegetables. B:  coffee with some honey with his medications and yogurt L: cup of soup or sandwich (pimento cheese or pb and J on whole wheat bread) S: crackers or an apple - he also loves blueberries S: lightly salted pecans D: sandwich or protein like pork, beans (3-4x/week), chicken. Chicken pot pie, soup (cambell soup). 2x/week he will have steamed brussels sprouts or broccoli. He likes salads with chicken when he goes out to eat. S: sometimes will have dessert like piece of cheesecake or pie or angel food cake Drinks: water, orange juice. He does not add salt to his foods. Discussed heart healthy eating.      Intervention Plan   Intervention Prescribe, educate and counsel regarding individualized specific dietary modifications aiming towards targeted core components such as weight, hypertension, lipid management, diabetes, heart failure and other comorbidities.;Nutrition handout(s) given to patient.    Expected Outcomes Short Term Goal: Understand basic principles of dietary content, such as calories, fat, sodium, cholesterol and nutrients.;Short Term Goal: A plan has been developed with personal nutrition goals set during dietitian appointment.;Long Term Goal: Adherence to prescribed nutrition plan.  Nutrition Assessments:  MEDIFICTS Score Key: ?70 Need to make dietary changes  40-70 Heart Healthy Diet ? 40 Therapeutic Level Cholesterol Diet  Flowsheet Row Cardiac Rehab from 11/14/2021 in [facility] Cardiac and Pulmonary Rehab  Picture Your Plate Total Score on Admission 67      Picture Your Plate Scores: <42 Unhealthy dietary pattern with much room for improvement. 41-50 Dietary pattern unlikely to meet recommendations for good health and room for improvement. 51-60 More healthful dietary pattern, with some room for improvement.  >60 Healthy dietary pattern, although there may be some specific behaviors that could be improved.    Nutrition Goals Re-Evaluation:  Nutrition Goals  Re-Evaluation     Row Name 12/06/21 1010             Goals   Nutrition Goal ST: read lables of ready to eat canned foods for sodium content, make sure having three good sources of protein daily (if none at lunch have a boiled egg with snack), explore different frozen vegeables for variety  LT: limit Na <1.5g/day, eat rainbow of fruits and vegetables each week, meet protein needs.       Comment Bill met with Melissa last week. He had a good appointment with her.  He got some tips to work on and getting variety. Overall, he eats well but is going to try reading labels and get enough protein and variety.       Expected Outcome ST: read lables of ready to eat canned foods for sodium content, make sure having three good sources of protein daily (if none at lunch have a boiled egg with snack), explore different frozen vegeables for variety  LT: limit Na <1.5g/day, eat rainbow of fruits and vegetables each week, meet protein needs.                Nutrition Goals Discharge (Final Nutrition Goals Re-Evaluation):  Nutrition Goals Re-Evaluation - 12/06/21 1010       Goals   Nutrition Goal ST: read lables of ready to eat canned foods for sodium content, make sure having three good sources of protein daily (if none at lunch have a boiled egg with snack), explore different frozen vegeables for variety  LT: limit Na <1.5g/day, eat rainbow of fruits and vegetables each week, meet protein needs.    Comment Bill met with Melissa last week. He had a good appointment with her.  He got some tips to work on and getting variety. Overall, he eats well but is going to try reading labels and get enough protein and variety.    Expected Outcome ST: read lables of ready to eat canned foods for sodium content, make sure having three good sources of protein daily (if none at lunch have a boiled egg with snack), explore different frozen vegeables for variety  LT: limit Na <1.5g/day, eat rainbow of fruits and vegetables  each week, meet protein needs.             Psychosocial: Target Goals: Acknowledge presence or absence of significant depression and/or stress, maximize coping skills, provide positive support system. Participant is able to verbalize types and ability to use techniques and skills needed for reducing stress and depression.   Education: Stress, Anxiety, and Depression - Group verbal and visual presentation to define topics covered.  Reviews how body is impacted by stress, anxiety, and depression.  Also discusses healthy ways to reduce stress and to treat/manage anxiety and depression.  Written material given at graduation. [place] [facility]  from 12/15/2021 in [facility] Cardiac and Pulmonary Rehab  Date 12/08/21  Educator [facility]  Instruction Review Code 1- Verbalizes Understanding       Education: Sleep Hygiene -Provides group verbal and written instruction about how sleep can affect your health.  Define sleep hygiene, discuss sleep cycles and impact of sleep habits. Review good sleep hygiene tips.    Initial Review & Psychosocial Screening:  Initial Psych Review & Screening - 10/28/21 1448       Initial Review   Current issues with Current Stress Concerns    Source of Stress Concerns Family    Comments son passed 09/06/21; handicapped son still lives at home      [place]? Yes   daughter, neighbors     Barriers   Psychosocial barriers to participate in program The patient should benefit from training in stress management and relaxation.      Screening Interventions   Interventions Encouraged to exercise;Provide feedback about the scores to participant;To provide support and resources with identified psychosocial needs    Expected Outcomes Short Term goal: Utilizing psychosocial counselor, staff and physician to assist with identification of specific Stressors or current issues interfering with healing process. Setting desired goal for each stressor  or current issue identified.;Long Term Goal: Stressors or current issues are controlled or eliminated.;Long Term goal: The participant improves quality of Life and PHQ9 Scores as seen by post scores and/or verbalization of changes;Short Term goal: Identification and review with participant of any Quality of Life or Depression concerns found by scoring the questionnaire.             Quality of Life Scores:   Quality of Life - 11/14/21 1608       Quality of Life   Select Quality of Life      Quality of Life Scores   Health/Function Pre 18.6 %    Socioeconomic Pre 26.25 %    Psych/Spiritual Pre 25.71 %    Family Pre 22.2 %    GLOBAL Pre 22.29 %            Scores of 19 and below usually indicate a poorer quality of life in these areas.  A difference of  2-3 points is a clinically meaningful difference.  A difference of 2-3 points in the total score of the Quality of Life Index has been associated with significant improvement in overall quality of life, self-image, physical symptoms, and general health in studies assessing change in quality of life.  PHQ-9: Recent Review Flowsheet Data     Depression screen [facility] 2/9 11/14/2021 10/25/2021 01/31/2021 06/29/2020 02/07/2019   Decreased Interest 0 1 0 0 0   Down, Depressed, Hopeless _0 0 1   PHQ - 2 Score _1 0 1   Altered sleeping 0 1 0 - -   Tired, decreased energy _2 - -   Change in appetite 1 1 0 - -   Feeling bad or failure about yourself  0 0 0 - -   Trouble concentrating 0 0 0 - -   Moving slowly or fidgety/restless 0 0 0 - -   Suicidal thoughts 0 0 0 - -   PHQ-9 Score _3 - -   Difficult doing work/chores Somewhat difficult Somewhat difficult Not difficult at all - -      Interpretation of Total Score  Total Score Depression Severity:  1-4 = Minimal depression, 5-9 = Mild depression,  10-14 = Moderate depression, 15-19 = Moderately severe depression, 20-27 = Severe depression   Psychosocial Evaluation and  Intervention:  Psychosocial Evaluation - 10/28/21 1500       Psychosocial Evaluation & Interventions   Interventions Encouraged to exercise with the program and follow exercise prescription;Stress management education    Comments Gary Mueller reports feeling well 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 Prior to his hospitalization, he was walking 8-10,000 steps a day and golfing three times a week. He wants to get back to that and is trying to work his way up to it. When asked about stress, he replied that he guesses he is under some and his doctor thinks he is as well. His 22 year old son passed away 09-25-21 so he is still dealing with that. His daughter lives local and his handicapped son lives with him. He remembers brining his wife, who has now passed away, to [employer] and thinks this program will be good for him and will give him guidance.    Expected Outcomes Short: attend Cardiac Rehab for education and exercise. Long: develop and maintain positive self care habits.    Continue Psychosocial Services  Follow up required by staff             Psychosocial Re-Evaluation:  Psychosocial Re-Evaluation     [place] Name 12/06/21 1008             Psychosocial Re-Evaluation   Current issues with Current Stress Concerns       Comments Gary Mueller is doing well in rehab. He is still coping with losing his wife an son so close together.  He is planning to start to meeting with a therapist.  He feels that his living kids are still a great support system and help him care for his handicapped son living with him too.  He feels good and denies depression but still has those hard to deal with feelings that he wants to meet to talk with therapist about as he copes with his greif.  He notes that he has a good life and a lot to still live for with grandkids too.       Expected Outcomes Short: Start meeting with therapist Long: Continue to focus on the positive.       Interventions Encouraged to attend Cardiac Rehabilitation for the  exercise;Stress management education       Continue Psychosocial Services  Follow up required by staff         Initial Review   Comments son passed 2021-09-25; handicapped son still lives at home                Psychosocial Discharge (Final Psychosocial Re-Evaluation):  Psychosocial Re-Evaluation - 12/06/21 1008       Psychosocial Re-Evaluation   Current issues with Current Stress Concerns    Comments Gary Mueller is doing well in rehab. He is still coping with losing his wife an son so close together.  He is planning to start to meeting with a therapist.  He feels that his living kids are still a great support system and help him care for his handicapped son living with him too.  He feels good and denies depression but still has those hard to deal with feelings that he wants to meet to talk with therapist about as he copes with his greif.  He notes that he has a good life and a lot to still live for with grandkids too.    Expected Outcomes Short: Start meeting with therapist  Long: Continue to focus on the positive.    Interventions Encouraged to attend Cardiac Rehabilitation for the exercise;Stress management education    Continue Psychosocial Services  Follow up required by staff      Initial Review   Comments son passed 09/06/21; handicapped son still lives at home             Vocational Rehabilitation: Provide vocational rehab assistance to qualifying candidates.   Vocational Rehab Evaluation & Intervention:  Vocational Rehab - 10/28/21 1448       Initial Vocational Rehab Evaluation & Intervention   Assessment shows need for Vocational Rehabilitation No             Education: Education Goals: Education classes will be provided on a variety of topics geared toward better understanding of heart health and risk factor modification. Participant will state understanding/return demonstration of topics presented as noted by education test scores.  Learning Barriers/Preferences:   Learning Barriers/Preferences - 10/28/21 1448       Learning Barriers/Preferences   Learning Barriers None    Learning Preferences None             General Cardiac Education Topics:  AED/CPR: - Group verbal and written instruction with the use of models to demonstrate the basic use of the AED with the basic ABC's of resuscitation.   Anatomy and Cardiac Procedures: - Group verbal and visual presentation and models provide information about basic cardiac anatomy and function. Reviews the testing methods done to diagnose heart disease and the outcomes of the test results. Describes the treatment choices: Medical Management, Angioplasty, or Coronary Bypass Surgery for treating various heart conditions including Myocardial Infarction, Angina, Valve Disease, and Cardiac Arrhythmias.  Written material given at graduation.   Medication Safety: - Group verbal and visual instruction to review commonly prescribed medications for heart and lung disease. Reviews the medication, class of the drug, and side effects. Includes the steps to properly store meds and maintain the prescription regimen.  Written material given at graduation.   Intimacy: - Group verbal instruction through game format to discuss how heart and lung disease can affect sexual intimacy. Written material given at graduation..   Know Your Numbers and Heart Failure: - Group verbal and visual instruction to discuss disease risk factors for cardiac and pulmonary disease and treatment options.  Reviews associated critical values for Overweight/Obesity, Hypertension, Cholesterol, and Diabetes.  Discusses basics of heart failure: signs/symptoms and treatments.  Introduces Heart Failure Zone chart for action plan for heart failure.  Written material given at graduation. Flowsheet Row Cardiac Rehab from 12/15/2021 in [facility] Cardiac and Pulmonary Rehab  Date 11/24/21  Educator SB  Instruction Review Code 1- Verbalizes Understanding        Infection Prevention: - Provides verbal and written material to individual with discussion of infection control including proper hand washing and proper equipment cleaning during exercise session. Flowsheet Row Cardiac Rehab from 12/15/2021 in [facility] Cardiac and Pulmonary Rehab  Date 11/14/21  Educator AS  Instruction Review Code 1- Verbalizes Understanding       Falls Prevention: - Provides verbal and written material to individual with discussion of falls prevention and safety. Flowsheet Row Cardiac Rehab from 12/15/2021 in [facility] Cardiac and Pulmonary Rehab  Date 11/14/21  Educator AS  Instruction Review Code 1- Verbalizes Understanding       Other: -Provides group and verbal instruction on various topics (see comments)   Knowledge Questionnaire Score:  Knowledge Questionnaire Score - 11/14/21 1609  Knowledge Questionnaire Score   Pre Score 24/26             Core Components/Risk Factors/Patient Goals at Admission:  Personal Goals and Risk Factors at Admission - 11/14/21 1602       Core Components/Risk Factors/Patient Goals on Admission   Hypertension Yes    Intervention Provide education on lifestyle modifcations including regular physical activity/exercise, weight management, moderate sodium restriction and increased consumption of fresh fruit, vegetables, and low fat dairy, alcohol moderation, and smoking cessation.;Monitor prescription use compliance.    Expected Outcomes Short Term: Continued assessment and intervention until BP is < 140/19m HG in hypertensive participants. < 130/858mHG in hypertensive participants with diabetes, heart failure or chronic kidney disease.;Long Term: Maintenance of blood pressure at goal levels.    Lipids Yes    Intervention Provide education and support for participant on nutrition & aerobic/resistive exercise along with prescribed medications to achieve LDL <7072mHDL >64m24m  Expected Outcomes Short Term: Participant  states understanding of desired cholesterol values and is compliant with medications prescribed. Participant is following exercise prescription and nutrition guidelines.;Long Term: Cholesterol controlled with medications as prescribed, with individualized exercise RX and with personalized nutrition plan. Value goals: LDL < 70mg6mL > 40 mg.             Education:Diabetes - Individual verbal and written instruction to review signs/symptoms of diabetes, desired ranges of glucose level fasting, after meals and with exercise. Acknowledge that pre and post exercise glucose checks will be done for 3 sessions at entry of program.   Core Components/Risk Factors/Patient Goals Review:   Goals and Risk Factor Review     Row Name 12/06/21 1012             Core Components/Risk Factors/Patient Goals Review   Personal Goals Review Weight Management/Obesity;Hypertension;Lipids       Review Bill Gary Landmarkoing well in rehab.  He lost some weight initially post MI, but is now holding steady around 169-170 lb.  Prior to all this he was 175-176 lb and would like to at least continue to build his strength back up.  His blood pressures are doing well in class.  He does not check them at home as he had never had any issues with it prior to heart event.  He is staying active at home.  He really wants to get back to golf again so we talked about the progression from bucket of balls back to the course.                Core Components/Risk Factors/Patient Goals at Discharge (Final Review):   Goals and Risk Factor Review - 12/06/21 1012       Core Components/Risk Factors/Patient Goals Review   Personal Goals Review Weight Management/Obesity;Hypertension;Lipids    Review Bill Gary Landmarkoing well in rehab.  He lost some weight initially post MI, but is now holding steady around 169-170 lb.  Prior to all this he was 175-176 lb and would like to at least continue to build his strength back up.  His blood pressures are  doing well in class.  He does not check them at home as he had never had any issues with it prior to heart event.  He is staying active at home.  He really wants to get back to golf again so we talked about the progression from bucket of balls back to the course.  ITP Comments:  ITP Comments     Row Name 10/28/21 1444 11/14/21 1605 11/22/21 0957 11/23/21 0618 11/28/21 0854   ITP Comments Initial telephone orientation completed. Documentation can be found in [facility] 10/25. EP orientation scheduled for Monday 11/21 at 3pm. Completed 6MWT and gym orientation. Initial ITP created and sent for review to Dr.Mark Sabra Heck, Medical Director. First full day of exercise!  Patient was oriented to gym and equipment including functions, settings, policies, and procedures.  Patient's individual exercise prescription and treatment plan were reviewed.  All starting workloads were established based on the results of the 6 minute walk test done at initial orientation visit.  The plan for exercise progression was also introduced and progression will be customized based on patient's performance and goals. 30 Day review completed. Medical Director ITP review done, changes made as directed, and signed approval by Medical Director.   New to program Completed initial RD consultation    [place] Name 12/21/21 0637           ITP Comments 30 Day review completed. Medical Director ITP review done, changes made as directed, and signed approval by Medical Director.                Comments:

## 2021-12-22 ENCOUNTER — Other Ambulatory Visit: Payer: Self-pay

## 2021-12-22 DIAGNOSIS — I214 Non-ST elevation (NSTEMI) myocardial infarction: Secondary | ICD-10-CM

## 2021-12-22 DIAGNOSIS — Z955 Presence of coronary angioplasty implant and graft: Secondary | ICD-10-CM

## 2021-12-22 NOTE — Progress Notes (Signed)
Daily Session Note  Patient Details  Name: Gary Mueller MRN: [id number] Date of Birth: 01/22/1941 Referring Provider:   Flowsheet Row Cardiac Rehab from 11/14/2021 in [facility] Cardiac and Pulmonary Rehab  Referring Provider Paraschos       Encounter Date: 12/22/2021  Check In:  Session Check In - 12/22/21 1057       Check-In   Supervising physician immediately available to respond to emergencies See telemetry face sheet for immediately available ER MD    Location [facility]-Cardiac & Pulmonary Rehab    Staff Present Will Bonnet, RN,BC,MSN;Amanda Oletta Darter, [place], ACSM CEP, Exercise Physiologist;Melissa Caiola, RDN, LDN    Virtual Visit No    Medication changes reported     No    Fall or balance concerns reported    No    Warm-up and Cool-down Performed on first and last piece of equipment    Resistance Training Performed Yes    VAD Patient? No    PAD/SET Patient? No      Pain Assessment   Currently in Pain? No/denies                Social History   Tobacco Use  Smoking Status Former   Packs/day: 1.50   Types: Cigarettes   Quit date: 12/24/1964   Years since quitting: 57.0  Smokeless Tobacco Never    Goals Met:  Independence with exercise equipment Exercise tolerated well No report of concerns or symptoms today Strength training completed today  Goals Unmet:  Not Applicable  Comments: Pt able to follow exercise prescription today without complaint.  Will continue to monitor for progression.    Dr. Emily Filbert is Medical Director for [place].  Dr. Ottie Glazier is Medical Director for [facility] Pulmonary Rehabilitation.

## 2021-12-27 ENCOUNTER — Encounter: Payer: Medicare Other | Attending: Cardiology

## 2021-12-27 ENCOUNTER — Other Ambulatory Visit: Payer: Self-pay

## 2021-12-27 DIAGNOSIS — Z955 Presence of coronary angioplasty implant and graft: Secondary | ICD-10-CM | POA: Insufficient documentation

## 2021-12-27 DIAGNOSIS — I214 Non-ST elevation (NSTEMI) myocardial infarction: Secondary | ICD-10-CM | POA: Insufficient documentation

## 2021-12-27 NOTE — Progress Notes (Signed)
Daily Session Note ° °Patient Details  °Name: Gary Mueller °MRN: [id number] °Date of Birth: 01/17/1941 °Referring Provider:   °Flowsheet Row Cardiac Rehab from 11/14/2021 in [facility] Cardiac and Pulmonary Rehab  °Referring Provider Paraschos  ° °  ° ° °Encounter Date: 12/27/2021 ° °Check In: ° Session Check In - 12/27/21 0942   ° °  ° Check-In  ° Supervising physician immediately available to respond to emergencies See telemetry face sheet for immediately available ER MD   ° Location [facility]-Cardiac & Pulmonary Rehab   ° Staff Present Kelly Bollinger, MPA, RN;Amanda Sommer, BA, ACSM CEP, Exercise Physiologist;Jessica Hawkins, MA, RCEP, CCRP, CCET   ° Virtual Visit No   ° Medication changes reported     No   ° Fall or balance concerns reported    No   ° Tobacco Cessation No Change   ° Warm-up and Cool-down Performed on first and last piece of equipment   ° Resistance Training Performed Yes   ° VAD Patient? No   ° PAD/SET Patient? No   °  ° Pain Assessment  ° Currently in Pain? No/denies   ° °  °  ° °  ° ° ° ° ° °Social History  ° °Tobacco Use  °Smoking Status Former  ° Packs/day: 1.50  ° Types: Cigarettes  ° Quit date: 12/24/1964  ° Years since quitting: 57.0  °Smokeless Tobacco Never  ° ° °Goals Met:  °Independence with exercise equipment °Exercise tolerated well °No report of concerns or symptoms today °Strength training completed today ° °Goals Unmet:  °Not Applicable ° °Comments: Pt able to follow exercise prescription today without complaint.  Will continue to monitor for progression. ° ° ° °Dr. Mark Miller is Medical Director for [facility].  °Dr. Fuad Aleskerov is Medical Director for [facility] Pulmonary Rehabilitation. °

## 2021-12-29 ENCOUNTER — Encounter: Payer: Self-pay | Admitting: Family Medicine

## 2021-12-29 ENCOUNTER — Other Ambulatory Visit: Payer: Self-pay | Admitting: Family Medicine

## 2021-12-29 ENCOUNTER — Other Ambulatory Visit: Payer: Self-pay

## 2021-12-29 DIAGNOSIS — I214 Non-ST elevation (NSTEMI) myocardial infarction: Secondary | ICD-10-CM

## 2021-12-29 DIAGNOSIS — L03031 Cellulitis of right toe: Secondary | ICD-10-CM | POA: Diagnosis not present

## 2021-12-29 DIAGNOSIS — Z955 Presence of coronary angioplasty implant and graft: Secondary | ICD-10-CM | POA: Diagnosis not present

## 2021-12-29 NOTE — Progress Notes (Signed)
Daily Session Note  Patient Details  Name: Gary Mueller MRN: [id number] Date of Birth: [birth year] Referring Provider:   Flowsheet Row Cardiac Rehab from 11/14/2021 in [facility] Cardiac and Pulmonary Rehab  Referring Provider Paraschos       Encounter Date: 12/29/2021  Check In:  Session Check In - 12/29/21 0941       Check-In   Supervising physician immediately available to respond to emergencies See telemetry face sheet for immediately available ER MD    Location [facility]-Cardiac & Pulmonary Rehab    Staff Present Birdie Sons, MPA, Elveria Rising, BA, ACSM CEP, Exercise Physiologist;Anina Schnake Amedeo Plenty, BS, ACSM CEP, Exercise Physiologist    Virtual Visit No    Medication changes reported     No    Fall or balance concerns reported    No    Tobacco Cessation No Change    Warm-up and Cool-down Performed on first and last piece of equipment    Resistance Training Performed Yes    VAD Patient? No    PAD/SET Patient? No      Pain Assessment   Currently in Pain? No/denies                Social History   Tobacco Use  Smoking Status Former   Packs/day: 1.50   Types: Cigarettes   Quit date: 12/24/1964   Years since quitting: 57.0  Smokeless Tobacco Never    Goals Met:  Independence with exercise equipment Exercise tolerated well No report of concerns or symptoms today Strength training completed today  Goals Unmet:  Not Applicable  Comments: Pt able to follow exercise prescription today without complaint.  Will continue to monitor for progression.    Dr. Emily Filbert is Medical Director for [place].  Dr. Ottie Glazier is Medical Director for [facility] Pulmonary Rehabilitation.

## 2021-12-30 NOTE — Telephone Encounter (Signed)
Requested medication (s) are on the active medication list: yes  Last refill:  10/20/21  Future visit scheduled: 03/01/22  Notes to clinic:  prescriber not in this practice, please assess.  Requested Prescriptions  Pending Prescriptions Disp Refills   metoprolol succinate (TOPROL-XL) 25 MG 24 hr tablet [Pharmacy Med Name: METOPROLOL SUCCINATE ER 25 MG TAB] 15 tablet 0    Sig: TAKE 1/2 TABLET BY MOUTH EVERY EVENING     Cardiovascular:  Beta Blockers Failed - 12/29/2021  5:17 PM      Failed - Last BP in normal range    BP Readings from Last 1 Encounters:  10/25/21 (!) 150/63          Passed - Last Heart Rate in normal range    Pulse Readings from Last 1 Encounters:  10/25/21 (!) 67          Passed - Valid encounter within last 6 months    Recent Outpatient Visits           2 months ago NSTEMI (non-ST elevated myocardial infarction) [facility])   [facility] Jerrol Banana., MD   5 months ago Essential hypertension   [facility] Jerrol Banana., MD   11 months ago Essential hypertension   [facility] Jerrol Banana., MD   1 year ago Essential hypertension   [facility] Jerrol Banana., MD   1 year ago PUD (peptic ulcer disease)   [facility] Jerrol Banana., MD       Future Appointments             In 2 months Jerrol Banana., MD [facility]

## 2022-01-03 ENCOUNTER — Other Ambulatory Visit: Payer: Self-pay

## 2022-01-03 DIAGNOSIS — Z955 Presence of coronary angioplasty implant and graft: Secondary | ICD-10-CM | POA: Diagnosis not present

## 2022-01-03 DIAGNOSIS — I214 Non-ST elevation (NSTEMI) myocardial infarction: Secondary | ICD-10-CM

## 2022-01-03 NOTE — Progress Notes (Signed)
Daily Session Note  Patient Details  Name: Gary Mueller MRN: [id number] Date of Birth: 03/28/1941 Referring Provider:   Flowsheet Row Cardiac Rehab from 11/14/2021 in [facility] Cardiac and Pulmonary Rehab  Referring Provider Paraschos       Encounter Date: 01/03/2022  Check In:  Session Check In - 01/03/22 0951       Check-In   Supervising physician immediately available to respond to emergencies See telemetry face sheet for immediately available ER MD    Location [facility]-Cardiac & Pulmonary Rehab    Staff Present Birdie Sons, MPA, RN;Amanda Oletta Darter, BA, ACSM CEP, Exercise Physiologist;Jessica [place], MA, RCEP, CCRP, CCET    Virtual Visit No    Medication changes reported     No    Fall or balance concerns reported    No    Tobacco Cessation No Change    Warm-up and Cool-down Performed on first and last piece of equipment    Resistance Training Performed Yes    VAD Patient? No    PAD/SET Patient? No      Pain Assessment   Currently in Pain? No/denies                Social History   Tobacco Use  Smoking Status Former   Packs/day: 1.50   Types: Cigarettes   Quit date: 12/24/1964   Years since quitting: 57.0  Smokeless Tobacco Never    Goals Met:  Independence with exercise equipment Exercise tolerated well No report of concerns or symptoms today Strength training completed today  Goals Unmet:  Not Applicable  Comments: Pt able to follow exercise prescription today without complaint.  Will continue to monitor for progression.    Dr. Emily Filbert is Medical Director for [place].  Dr. Ottie Glazier is Medical Director for [facility] Pulmonary Rehabilitation.

## 2022-01-05 ENCOUNTER — Other Ambulatory Visit: Payer: Self-pay

## 2022-01-05 DIAGNOSIS — I214 Non-ST elevation (NSTEMI) myocardial infarction: Secondary | ICD-10-CM | POA: Diagnosis not present

## 2022-01-05 DIAGNOSIS — Z955 Presence of coronary angioplasty implant and graft: Secondary | ICD-10-CM | POA: Diagnosis not present

## 2022-01-05 NOTE — Progress Notes (Signed)
Daily Session Note  Patient Details  Name: Gary Mueller MRN: [id number] Date of Birth: 1941/08/02 Referring Provider:   Flowsheet Row Cardiac Rehab from 11/14/2021 in [facility] Cardiac and Pulmonary Rehab  Referring Provider Paraschos       Encounter Date: 01/05/2022  Check In:  Session Check In - 01/05/22 0944       Check-In   Supervising physician immediately available to respond to emergencies See telemetry face sheet for immediately available ER MD    Location [facility]-Cardiac & Pulmonary Rehab    Staff Present Birdie Sons, MPA, RN;Jessica [place], MA, RCEP, CCRP, CCET;Melissa Dennis, RDN, LDN    Virtual Visit No    Medication changes reported     No    Fall or balance concerns reported    No    Tobacco Cessation No Change    Warm-up and Cool-down Performed on first and last piece of equipment    Resistance Training Performed Yes    VAD Patient? No    PAD/SET Patient? No      Pain Assessment   Currently in Pain? No/denies                Social History   Tobacco Use  Smoking Status Former   Packs/day: 1.50   Types: Cigarettes   Quit date: 12/24/1964   Years since quitting: 57.0  Smokeless Tobacco Never    Goals Met:  Independence with exercise equipment Exercise tolerated well No report of concerns or symptoms today Strength training completed today  Goals Unmet:  Not Applicable  Comments: Pt able to follow exercise prescription today without complaint.  Will continue to monitor for progression.    Dr. Emily Filbert is Medical Director for [place].  Dr. Ottie Glazier is Medical Director for [facility] Pulmonary Rehabilitation.

## 2022-01-10 ENCOUNTER — Other Ambulatory Visit: Payer: Self-pay

## 2022-01-10 DIAGNOSIS — I214 Non-ST elevation (NSTEMI) myocardial infarction: Secondary | ICD-10-CM | POA: Diagnosis not present

## 2022-01-10 DIAGNOSIS — Z955 Presence of coronary angioplasty implant and graft: Secondary | ICD-10-CM

## 2022-01-10 NOTE — Progress Notes (Signed)
Daily Session Note  Patient Details  Name: QUILL GRINDER MRN: [id number] Date of Birth: 02-19-1941 Referring Provider:   Flowsheet Row Cardiac Rehab from 11/14/2021 in [facility] Cardiac and Pulmonary Rehab  Referring Provider Paraschos       Encounter Date: 01/10/2022  Check In:  Session Check In - 01/10/22 1017       Check-In   Supervising physician immediately available to respond to emergencies See telemetry face sheet for immediately available ER MD    Location [facility]-Cardiac & Pulmonary Rehab    Staff Present Birdie Sons, MPA, RN;Jessica [place], MA, RCEP, CCRP, CCET;Amanda Sommer, BA, ACSM CEP, Exercise Physiologist    Virtual Visit No    Medication changes reported     No    Fall or balance concerns reported    No    Tobacco Cessation No Change    Warm-up and Cool-down Performed on first and last piece of equipment    Resistance Training Performed Yes    VAD Patient? No    PAD/SET Patient? No      Pain Assessment   Currently in Pain? No/denies                Social History   Tobacco Use  Smoking Status Former   Packs/day: 1.50   Types: Cigarettes   Quit date: 12/24/1964   Years since quitting: 57.0  Smokeless Tobacco Never    Goals Met:  Independence with exercise equipment Exercise tolerated well No report of concerns or symptoms today Strength training completed today  Goals Unmet:  Not Applicable  Comments: Pt able to follow exercise prescription today without complaint.  Will continue to monitor for progression.    Dr. Emily Filbert is Medical Director for [place].  Dr. Ottie Glazier is Medical Director for [facility] Pulmonary Rehabilitation.

## 2022-01-11 DIAGNOSIS — M1990 Unspecified osteoarthritis, unspecified site: Secondary | ICD-10-CM | POA: Diagnosis not present

## 2022-01-11 DIAGNOSIS — M159 Polyosteoarthritis, unspecified: Secondary | ICD-10-CM | POA: Diagnosis not present

## 2022-01-12 ENCOUNTER — Other Ambulatory Visit: Payer: Self-pay

## 2022-01-12 DIAGNOSIS — Z955 Presence of coronary angioplasty implant and graft: Secondary | ICD-10-CM

## 2022-01-12 DIAGNOSIS — I214 Non-ST elevation (NSTEMI) myocardial infarction: Secondary | ICD-10-CM

## 2022-01-12 NOTE — Progress Notes (Signed)
Daily Session Note  Patient Details  Name: Gary Mueller MRN: [id number] Date of Birth: 04-22-1941 Referring Provider:   Flowsheet Row Cardiac Rehab from 11/14/2021 in [facility] Cardiac and Pulmonary Rehab  Referring Provider Paraschos       Encounter Date: 01/12/2022  Check In:  Session Check In - 01/12/22 0942       Check-In   Supervising physician immediately available to respond to emergencies See telemetry face sheet for immediately available ER MD    Location [facility]-Cardiac & Pulmonary Rehab    Staff Present Birdie Sons, MPA, RN;Jessica Luan Pulling, MA, RCEP, CCRP, CCET;Amanda Sommer, BA, ACSM CEP, Exercise Physiologist    Virtual Visit No    Medication changes reported     No    Fall or balance concerns reported    No    Tobacco Cessation No Change    Warm-up and Cool-down Performed on first and last piece of equipment    Resistance Training Performed Yes    VAD Patient? No    PAD/SET Patient? No      Pain Assessment   Currently in Pain? No/denies                Social History   Tobacco Use  Smoking Status Former   Packs/day: 1.50   Types: Cigarettes   Quit date: 12/24/1964   Years since quitting: 57.0  Smokeless Tobacco Never    Goals Met:  Independence with exercise equipment Exercise tolerated well No report of concerns or symptoms today Strength training completed today  Goals Unmet:  Not Applicable  Comments: Pt able to follow exercise prescription today without complaint.  Will continue to monitor for progression.    Dr. Emily Filbert is Medical Director for [facility].  Dr. Ottie Glazier is Medical Director for [facility] Pulmonary Rehabilitation.

## 2022-01-16 ENCOUNTER — Other Ambulatory Visit: Payer: Self-pay | Admitting: Family Medicine

## 2022-01-16 NOTE — Telephone Encounter (Signed)
Requested medication (s) are due for refill today: Unsure  Requested medication (s) are on the active medication list: no    Last refill: 07/28/21  #90  0 refills  Future visit scheduled no  Notes to clinic: Not on current med profile. Please review. Thank you.  Requested Prescriptions  Pending Prescriptions Disp Refills   amLODipine (NORVASC) 5 MG tablet [Pharmacy Med Name: AMLODIPINE BESYLATE 5 MG TAB] 90 tablet     Sig: TAKE ONE TABLET EVERY DAY     Cardiovascular:  Calcium Channel Blockers Failed - 01/16/2022  1:17 AM      Failed - Last BP in normal range    BP Readings from Last 1 Encounters:  10/25/21 (!) 150/63          Passed - Valid encounter within last 6 months    Recent Outpatient Visits           2 months ago NSTEMI (non-ST elevated myocardial infarction) [facility])   [facility] Jerrol Banana., MD   5 months ago Essential hypertension   [facility] Jerrol Banana., MD   11 months ago Essential hypertension   [facility] Jerrol Banana., MD   1 year ago Essential hypertension   [facility] Jerrol Banana., MD   1 year ago PUD (peptic ulcer disease)   [facility] Jerrol Banana., MD       Future Appointments             In 1 month Jerrol Banana., MD [facility]

## 2022-01-17 ENCOUNTER — Other Ambulatory Visit: Payer: Self-pay

## 2022-01-17 DIAGNOSIS — Z955 Presence of coronary angioplasty implant and graft: Secondary | ICD-10-CM | POA: Diagnosis not present

## 2022-01-17 DIAGNOSIS — I214 Non-ST elevation (NSTEMI) myocardial infarction: Secondary | ICD-10-CM | POA: Diagnosis not present

## 2022-01-17 NOTE — Progress Notes (Signed)
Daily Session Note  Patient Details  Name: Gary Mueller MRN: [id number] Date of Birth: 10-04-1941 Referring Provider:   Flowsheet Row Cardiac Rehab from 11/14/2021 in [facility] Cardiac and Pulmonary Rehab  Referring Provider Paraschos       Encounter Date: 01/17/2022  Check In:  Session Check In - 01/17/22 0957       Check-In   Supervising physician immediately available to respond to emergencies See telemetry face sheet for immediately available ER MD    Location [facility]-Cardiac & Pulmonary Rehab    Staff Present Birdie Sons, MPA, RN;Jessica Aquilla, MA, RCEP, CCRP, CCET;Melissa [place], RDN, LDN    Virtual Visit No    Medication changes reported     No    Fall or balance concerns reported    No    Tobacco Cessation No Change    Warm-up and Cool-down Performed on first and last piece of equipment    Resistance Training Performed Yes    VAD Patient? No    PAD/SET Patient? No      Pain Assessment   Currently in Pain? No/denies                Social History   Tobacco Use  Smoking Status Former   Packs/day: 1.50   Types: Cigarettes   Quit date: 12/24/1964   Years since quitting: 57.1  Smokeless Tobacco Never    Goals Met:  Independence with exercise equipment Exercise tolerated well No report of concerns or symptoms today Strength training completed today  Goals Unmet:  Not Applicable  Comments: Pt able to follow exercise prescription today without complaint.  Will continue to monitor for progression.    Dr. Emily Filbert is Medical Director for [place].  Dr. Ottie Glazier is Medical Director for [facility] Pulmonary Rehabilitation.

## 2022-01-18 ENCOUNTER — Encounter: Payer: Self-pay | Admitting: *Deleted

## 2022-01-18 DIAGNOSIS — I214 Non-ST elevation (NSTEMI) myocardial infarction: Secondary | ICD-10-CM

## 2022-01-18 DIAGNOSIS — Z955 Presence of coronary angioplasty implant and graft: Secondary | ICD-10-CM

## 2022-01-18 NOTE — Progress Notes (Signed)
Cardiac Individual Treatment Plan  Patient Details  Name: Gary Mueller MRN: [id number] Date of Birth: 14-Nov-1941 Referring Provider:   Flowsheet Row Cardiac Rehab from 11/14/2021 in [facility] Cardiac and Pulmonary Rehab  Referring Provider Paraschos       Initial Encounter Date:  Flowsheet Row Cardiac Rehab from 11/14/2021 in [facility] Cardiac and Pulmonary Rehab  Date 11/14/21       Visit Diagnosis: NSTEMI (non-ST elevated myocardial infarction) [facility])  Status post coronary artery stent placement  Patient's Home Medications on Admission:  Current Outpatient Medications:    acetaminophen (TYLENOL) 500 MG tablet, Take 1,000 mg by mouth daily as needed for moderate pain., Disp: , Rfl:    amLODipine (NORVASC) 5 MG tablet, TAKE ONE TABLET EVERY DAY, Disp: 90 tablet, Rfl: 0   ascorbic acid (VITAMIN C) 1000 MG tablet, Take 1 tablet by mouth daily., Disp: , Rfl:    aspirin EC 81 MG EC tablet, Take 1 tablet (81 mg total) by mouth daily. Swallow whole., Disp: 30 tablet, Rfl: 0   folic acid (FOLVITE) 1 MG tablet, Take 1 mg by mouth daily., Disp: , Rfl:    levothyroxine (SYNTHROID) 100 MCG tablet, TAKE 1 TABLET EVERY DAY ON EMPTY STOMACHWITH A GLASS OF WATER AT LEAST 30-60 MINBEFORE BREAKFAST, Disp: 90 tablet, Rfl: 0   lisinopril (ZESTRIL) 5 MG tablet, Take 1 tablet (5 mg total) by mouth daily., Disp: 30 tablet, Rfl: 0   methotrexate 2.5 MG tablet, Take 15 mg by mouth every Wednesday., Disp: , Rfl:    metoprolol succinate (TOPROL-XL) 25 MG 24 hr tablet, TAKE 1/2 TABLET BY MOUTH EVERY EVENING, Disp: 15 tablet, Rfl: 0   Multiple Vitamin (MULTIVITAMIN WITH MINERALS) TABS tablet, Take 1 tablet by mouth every evening. , Disp: , Rfl:    nitroGLYCERIN (NITROSTAT) 0.4 MG SL tablet, Place 1 tablet (0.4 mg total) under the tongue every 5 (five) minutes as needed for chest pain., Disp: 30 tablet, Rfl: 0   pantoprazole (PROTONIX) 40 MG tablet, TAKE ONE TABLET (40 MG) BY MOUTH EVERY DAY, Disp: 90 tablet, Rfl:  1   rosuvastatin (CRESTOR) 40 MG tablet, Take 1 tablet (40 mg total) by mouth daily., Disp: 90 tablet, Rfl: 1   ticagrelor (BRILINTA) 90 MG TABS tablet, Take 1 tablet (90 mg total) by mouth 2 (two) times daily., Disp: 60 tablet, Rfl: 0   vitamin E 180 MG (400 UNITS) capsule, Take 1 capsule by mouth daily., Disp: , Rfl:   Past Medical History: Past Medical History:  Diagnosis Date   Allergy    Anxiety    Arthritis    Cancer ([place]) 10/20/2016   Basal Cell Skin Cancer; lip, neck   Cystoid macular edema of left eye 07/07/2019   Depression    Dysrhythmia    A FIB   GERD (gastroesophageal reflux disease)    HOH (hard of hearing)    Bilateral   Hyperlipidemia    Hypertension    Patient denies   Hypothyroidism    IBS (irritable bowel syndrome)    Inguinal hernia    bilateral   Pneumonia    Thyroid disease    Tinnitus of both ears     Tobacco Use: Social History   Tobacco Use  Smoking Status Former   Packs/day: 1.50   Types: Cigarettes   Quit date: 12/24/1964   Years since quitting: 54.1  Smokeless Tobacco Never    Labs: Recent Review Scientist, physiological     Labs for ITP Cardiac and Pulmonary Rehab  Latest Ref Rng & Units 01/10/2018 06/16/2019 01/28/2020 02/01/2021 10/26/2021   Cholestrol 100 - 199 mg/dL 212(H) 150 193 201(H) 154   LDLCALC 0 - 99 mg/dL 121(H) 81 134(H) 139(H) 99   HDL >39 mg/dL 33(L) 49 40 40 38(L)   Trlycerides 0 - 149 mg/dL 290(H) 101 103 119 89        Exercise Target Goals: Exercise Program Goal: Individual exercise prescription set using results from initial 6 min walk test and THRR while considering  patients activity barriers and safety.   Exercise Prescription Goal: Initial exercise prescription builds to 30-45 minutes a day of aerobic activity, 2-3 days per week.  Home exercise guidelines will be given to patient during program as part of exercise prescription that the participant will acknowledge.   Education: Aerobic Exercise: - Group verbal and  visual presentation on the components of exercise prescription. Introduces F.I.T.T principle from ACSM for exercise prescriptions.  Reviews F.I.T.T. principles of aerobic exercise including progression. Written material given at graduation. Flowsheet Row Cardiac Rehab from 01/12/2022 in [facility] Cardiac and Pulmonary Rehab  Date 12/22/21  Educator [facility]  Instruction Review Code 1- Verbalizes Understanding       Education: Resistance Exercise: - Group verbal and visual presentation on the components of exercise prescription. Introduces F.I.T.T principle from ACSM for exercise prescriptions  Reviews F.I.T.T. principles of resistance exercise including progression. Written material given at graduation. Flowsheet Row Cardiac Rehab from 01/12/2022 in [facility] Cardiac and Pulmonary Rehab  Date 12/29/21  Educator [facility]  Instruction Review Code 1- Verbalizes Understanding        Education: Exercise & Equipment Safety: - Individual verbal instruction and demonstration of equipment use and safety with use of the equipment. Flowsheet Row Cardiac Rehab from 01/12/2022 in [facility] Cardiac and Pulmonary Rehab  Date 11/14/21  Educator [facility]  Instruction Review Code 1- Verbalizes Understanding       Education: Exercise Physiology & General Exercise Guidelines: - Group verbal and written instruction with models to review the exercise physiology of the cardiovascular system and associated critical values. Provides general exercise guidelines with specific guidelines to those with heart or lung disease.  Flowsheet Row Cardiac Rehab from 01/12/2022 in [facility] Cardiac and Pulmonary Rehab  Education need identified 11/14/21  Date 12/15/21  Educator [facility]  Instruction Review Code 1- [employer] Understanding       Education: Flexibility, Balance, Mind/Body Relaxation: - Group verbal and visual presentation with interactive activity on the components of exercise prescription. Introduces F.I.T.T principle from ACSM for exercise  prescriptions. Reviews F.I.T.T. principles of flexibility and balance exercise training including progression. Also discusses the mind body connection.  Reviews various relaxation techniques to help reduce and manage stress (i.e. Deep breathing, progressive muscle relaxation, and visualization). Balance handout provided to take home. Written material given at graduation.   Activity Barriers & Risk Stratification:  Activity Barriers & Cardiac Risk Stratification - 10/28/21 1444       Activity Barriers & Cardiac Risk Stratification   Activity Barriers Left Knee Replacement;Right Knee Replacement    Cardiac Risk Stratification Moderate             6 Minute Walk:  6 Minute Walk     Row Name 11/14/21 1549         6 Minute Walk   Phase Initial     Distance 1205 feet     Walk Time 6 minutes     # of Rest Breaks 0     MPH 2.28  METS 2.1     RPE 11     Perceived Dyspnea  1     VO2 Peak 7.4     Symptoms No     Resting HR 60 bpm     Resting BP 104/60     Resting Oxygen Saturation  98 %     Exercise Oxygen Saturation  during 6 min walk 97 %     Max Ex. HR 76 bpm     Max Ex. BP 132/60     2 Minute Post BP 114/58              Oxygen Initial Assessment:   Oxygen Re-Evaluation:   Oxygen Discharge (Final Oxygen Re-Evaluation):   Initial Exercise Prescription:  Initial Exercise Prescription - 11/14/21 1500       Date of Initial Exercise RX and Referring Provider   Date 11/14/21    Referring Provider Paraschos      Oxygen   Maintain Oxygen Saturation 88% or higher      Treadmill   MPH 2    Grade 0.5    Minutes 15    METs 2.6      NuStep   Level 2    SPM 80    Minutes 15    METs 2.1      Recumbant Elliptical   Level 1    RPM 50    Minutes 15    METs 2.1      Biostep-RELP   Level 2    SPM 50    Minutes 15    METs 2      Prescription Details   Frequency (times per week) 3    Duration Progress to 30 minutes of continuous aerobic without  signs/symptoms of physical distress      Intensity   THRR 40-80% of Max Heartrate 91-122    Ratings of Perceived Exertion 11-13    Perceived Dyspnea 0-4      Resistance Training   Training Prescription Yes    Weight 3 lb    Reps 10-15             Perform Capillary Blood Glucose checks as needed.  Exercise Prescription Changes:   Exercise Prescription Changes     Row Name 11/14/21 1600 12/05/21 1400 12/21/21 0800 01/02/22 1300 01/16/22 1000     Response to Exercise   Blood Pressure (Admit) 104/60 118/64 112/60 132/58 122/64   Blood Pressure (Exercise) 132/60 128/60 130/70 -- --   Blood Pressure (Exit) 114/58 120/60 118/62 136/62 120/62   Heart Rate (Admit) 60 bpm 57 bpm 59 bpm 67 bpm 60 bpm   Heart Rate (Exercise) 76 bpm 78 bpm 105 bpm 90 bpm 81 bpm   Heart Rate (Exit) 61 bpm 70 bpm 86 bpm 69 bpm 78 bpm   Oxygen Saturation (Admit) 98 % -- -- -- --   Oxygen Saturation (Exercise) 97 % -- -- -- --   Rating of Perceived Exertion (Exercise) 11 12 12 13 13    Perceived Dyspnea (Exercise) 1 -- -- -- --   Symptoms none none none none none   Comments -- fifth day exercise -- -- --   Duration -- Continue with 30 min of aerobic exercise without signs/symptoms of physical distress. Continue with 30 min of aerobic exercise without signs/symptoms of physical distress. Continue with 30 min of aerobic exercise without signs/symptoms of physical distress. Continue with 30 min of aerobic exercise without signs/symptoms of physical distress.   Intensity -- THRR unchanged  THRR unchanged THRR unchanged THRR unchanged     Progression   Progression -- Continue to progress workloads to maintain intensity without signs/symptoms of physical distress. Continue to progress workloads to maintain intensity without signs/symptoms of physical distress. Continue to progress workloads to maintain intensity without signs/symptoms of physical distress. Continue to progress workloads to maintain intensity  without signs/symptoms of physical distress.   Average METs -- 2.74 3.02 2.9 3.4     Resistance Training   Training Prescription -- Yes Yes Yes Yes   Weight -- 3 lb 5 lb 5 lb 5 lb   Reps -- 10-15 10-15 10-15 10-15     Interval Training   Interval Training -- -- No -- No     Treadmill   MPH -- -- 2.5 -- 2.3   Grade -- -- 1 -- 3   Minutes -- -- 15 -- 15   METs -- -- 3.26 -- 3.71     NuStep   Level -- 4 5 -- 5   Minutes -- 15 15 -- 15   METs -- 3.3 3.2 -- 3.4     Recumbant Elliptical   Level -- -- 3.5 -- 4   Minutes -- -- 15 -- 15   METs -- -- 2.6 -- 1.7     REL-XR   Level -- -- -- -- 1   Minutes -- -- -- -- 15   METs -- -- -- -- 2.4     Biostep-RELP   Level -- 2 2 -- --   Minutes -- 15 15 -- --   METs -- 2 -- -- --     Home Exercise Plan   Plans to continue exercise at -- -- Home (comment)  walking, staff videos -- Home (comment)  walking, staff videos   Frequency -- -- Add 2 additional days to program exercise sessions. -- Add 2 additional days to program exercise sessions.   Initial Home Exercises Provided -- -- 12/06/21 -- 12/06/21            Exercise Comments:   Exercise Comments     Row Name 11/22/21 0957           Exercise Comments First full day of exercise!  Patient was oriented to gym and equipment including functions, settings, policies, and procedures.  Patient's individual exercise prescription and treatment plan were reviewed.  All starting workloads were established based on the results of the 6 minute walk test done at initial orientation visit.  The plan for exercise progression was also introduced and progression will be customized based on patient's performance and goals.                Exercise Goals and Review:   Exercise Goals     Row Name 11/14/21 1600             Exercise Goals   Increase Physical Activity Yes       Intervention Provide advice, education, support and counseling about physical activity/exercise  needs.;Develop an individualized exercise prescription for aerobic and resistive training based on initial evaluation findings, risk stratification, comorbidities and participant's personal goals.       Expected Outcomes Short Term: Attend rehab on a regular basis to increase amount of physical activity.;Long Term: Add in home exercise to make exercise part of routine and to increase amount of physical activity.;Long Term: Exercising regularly at least 3-5 days a week.       Increase Strength and Stamina Yes  Intervention Provide advice, education, support and counseling about physical activity/exercise needs.;Develop an individualized exercise prescription for aerobic and resistive training based on initial evaluation findings, risk stratification, comorbidities and participant's personal goals.       Expected Outcomes Short Term: Increase workloads from initial exercise prescription for resistance, speed, and METs.;Short Term: Perform resistance training exercises routinely during rehab and add in resistance training at home;Long Term: Improve cardiorespiratory fitness, muscular endurance and strength as measured by increased METs and functional capacity (6MWT)       Able to understand and use rate of perceived exertion (RPE) scale Yes       Intervention Provide education and explanation on how to use RPE scale       Expected Outcomes Short Term: Able to use RPE daily in rehab to express subjective intensity level;Long Term:  Able to use RPE to guide intensity level when exercising independently       Able to understand and use Dyspnea scale Yes       Intervention Provide education and explanation on how to use Dyspnea scale       Expected Outcomes Short Term: Able to use Dyspnea scale daily in rehab to express subjective sense of shortness of breath during exertion;Long Term: Able to use Dyspnea scale to guide intensity level when exercising independently       Knowledge and understanding of  Target Heart Rate Range (THRR) Yes       Intervention Provide education and explanation of THRR including how the numbers were predicted and where they are located for reference       Expected Outcomes Short Term: Able to state/look up THRR;Short Term: Able to use daily as guideline for intensity in rehab;Long Term: Able to use THRR to govern intensity when exercising independently       Able to check pulse independently Yes       Intervention Provide education and demonstration on how to check pulse in carotid and radial arteries.;Review the importance of being able to check your own pulse for safety during independent exercise       Expected Outcomes Short Term: Able to explain why pulse checking is important during independent exercise;Long Term: Able to check pulse independently and accurately       Understanding of Exercise Prescription Yes       Intervention Provide education, explanation, and written materials on patient's individual exercise prescription       Expected Outcomes Short Term: Able to explain program exercise prescription;Long Term: Able to explain home exercise prescription to exercise independently                Exercise Goals Re-Evaluation :  Exercise Goals Re-Evaluation     Row Name 11/22/21 0957 12/05/21 1401 12/06/21 1007 12/21/21 0810 12/27/21 0955     Exercise Goal Re-Evaluation   Exercise Goals Review Increase Physical Activity;Able to understand and use rate of perceived exertion (RPE) scale;Knowledge and understanding of Target Heart Rate Range (THRR);Understanding of Exercise Prescription;Increase Strength and Stamina;Able to understand and use Dyspnea scale;Able to check pulse independently Increase Physical Activity;Increase Strength and Stamina Increase Physical Activity;Increase Strength and Stamina;Able to understand and use rate of perceived exertion (RPE) scale;Able to understand and use Dyspnea scale;Knowledge and understanding of Target Heart Rate  Range (THRR);Able to check pulse independently;Understanding of Exercise Prescription Increase Physical Activity;Increase Strength and Stamina;Understanding of Exercise Prescription Increase Physical Activity;Increase Strength and Stamina;Understanding of Exercise Prescription   Comments Reviewed RPE and dyspnea scales, THR and program  prescription with pt today.  Pt voiced understanding and was given a copy of goals to take home. Gary Mueller is tolerating exercise well in his first weeks.  he has increase NS to level 4.  Staff will monitor progress. Gary Mueller has always been active at home with a weight routine and walking his dog.  He also stays busy with mowing the yard and doing other yard work.  Reviewed home exercise with pt today.  Pt plans to continue to walk with dog at home for exercise.  Gary Mueller also has a weight routine that he continues to do daily.  Reviewed THR, pulse, RPE, sign and symptoms, pulse oximetery and when to call 911 or MD.  Also discussed weather considerations and indoor options.  Pt voiced understanding. He really wants to get back to golf again so we talked about the progression from bucket of balls back to the course. Gary Mueller has been doing well in rehab.  He is up to 2.5 mph on the treadmill and using 5 lb hand weights.  We will continue to monitor his progress. Gary Mueller is doing well in rehab.  He has gotten back to his walking routine and has enjoyed the warmer days recently.  He has noted that his strength and stamina are improving.  He is breathing better too.  He is getting a home stationary bike to use today and excited to have an indoor option for winter weather.   Expected Outcomes Short: Use RPE daily to regulate intensity. Long: Follow program prescription in THR. Short:  attend consistently Long:  build overall stamina Short: Continue to add in walking at home on off days Long; Continue to improve stamina. Short: Try a little more incline on treadmill Long: Conitnue to improve stamina. SHort:  Continue to walk more on off days Long: contiue to improve stamina    Row Name 01/02/22 1354 01/16/22 1102           Exercise Goal Re-Evaluation   Exercise Goals Review Increase Physical Activity;Increase Strength and Stamina Increase Physical Activity;Increase Strength and Stamina      Comments Gary Mueller is progressing well and varies TM between 2.2 and 2.5 mph and 1-2% incline.  We will continue to monitor progress. Gary Mueller continues to do well. He rarely hits his THRZ, however, his RPEs are staying in appropriate ranges. He increased the incline on the TM to 3%. Will continue to monitor.      Expected Outcomes Short: continue to increase incline on TM Long: increase average MET level Short: Continue to build up loads on TM progressively Long: Continue to build up strength and stamina               Discharge Exercise Prescription (Final Exercise Prescription Changes):  Exercise Prescription Changes - 01/16/22 1000       Response to Exercise   Blood Pressure (Admit) 122/64    Blood Pressure (Exit) 120/62    Heart Rate (Admit) 60 bpm    Heart Rate (Exercise) 81 bpm    Heart Rate (Exit) 78 bpm    Rating of Perceived Exertion (Exercise) 13    Symptoms none    Duration Continue with 30 min of aerobic exercise without signs/symptoms of physical distress.    Intensity THRR unchanged      Progression   Progression Continue to progress workloads to maintain intensity without signs/symptoms of physical distress.    Average METs 3.4      Resistance Training   Training Prescription Yes  Weight 5 lb    Reps 10-15      Interval Training   Interval Training No      Treadmill   MPH 2.3    Grade 3    Minutes 15    METs 3.71      NuStep   Level 5    Minutes 15    METs 3.4      Recumbant Elliptical   Level 4    Minutes 15    METs 1.7      REL-XR   Level 1    Minutes 15    METs 2.4      Home Exercise Plan   Plans to continue exercise at Home (comment)   walking, staff  videos   Frequency Add 2 additional days to program exercise sessions.    Initial Home Exercises Provided 12/06/21             Nutrition:  Target Goals: Understanding of nutrition guidelines, daily intake of sodium '1500mg'$ , cholesterol '200mg'$ , calories 30% from fat and 7% or less from saturated fats, daily to have 5 or more servings of fruits and vegetables.  Education: All About Nutrition: -Group instruction provided by verbal, written material, interactive activities, discussions, models, and posters to present general guidelines for heart healthy nutrition including fat, fiber, MyPlate, the role of sodium in heart healthy nutrition, utilization of the nutrition label, and utilization of this knowledge for meal planning. Follow up email sent as well. Written material given at graduation.   Biometrics:  Pre Biometrics - 11/14/21 1601       Pre Biometrics   Height $Remov'5\' 10"'OZiAwt$  (1.778 m)    Weight 168 lb 1.6 oz (76.2 kg)    BMI (Calculated) 24.12    Single Leg Stand 2.2 seconds              Nutrition Therapy Plan and Nutrition Goals:  Nutrition Therapy & Goals - 11/28/21 0818       Nutrition Therapy   Diet Heart healthy, low Na    Drug/Food Interactions Statins/Certain Fruits    Protein (specify units) 60g    Fiber 30 grams    Whole Grain Foods 3 servings    Saturated Fats 12 max. grams    Fruits and Vegetables 8 servings/day      Personal Nutrition Goals   Nutrition Goal ST: read lables of ready to eat canned foods for sodium content, make sure having three good sources of protein daily (if none at lunch have a boiled egg with snack), explore different frozen vegeables for variety  LT: limit Na <1.5g/day, eat rainbow of fruits and vegetables each week, meet protein needs.    Comments He prepares many of his own meals, but he is not a good cook; he eats lots of legumes, grains, and fruit/vegetables. B: coffee with some honey with his medications and yogurt L: cup of soup or  sandwich (pimento cheese or pb and J on whole wheat bread) S: crackers or an apple - he also loves blueberries S: lightly salted pecans D: sandwich or protein like pork, beans (3-4x/week), chicken. Chicken pot pie, soup (cambell soup). 2x/week he will have steamed brussels sprouts or broccoli. He likes salads with chicken when he goes out to eat. S: sometimes will have dessert like piece of cheesecake or pie or angel food cake Drinks: water, orange juice. He does not add salt to his foods. Discussed heart healthy eating.      Intervention Plan  Intervention Prescribe, educate and counsel regarding individualized specific dietary modifications aiming towards targeted core components such as weight, hypertension, lipid management, diabetes, heart failure and other comorbidities.;Nutrition handout(s) given to patient.    Expected Outcomes Short Term Goal: Understand basic principles of dietary content, such as calories, fat, sodium, cholesterol and nutrients.;Short Term Goal: A plan has been developed with personal nutrition goals set during dietitian appointment.;Long Term Goal: Adherence to prescribed nutrition plan.             Nutrition Assessments:  MEDIFICTS Score Key: ?70 Need to make dietary changes  40-70 Heart Healthy Diet ? 40 Therapeutic Level Cholesterol Diet  Flowsheet Row Cardiac Rehab from 11/14/2021 in [facility] Cardiac and Pulmonary Rehab  Picture Your Plate Total Score on Admission 67      Picture Your Plate Scores: <13 Unhealthy dietary pattern with much room for improvement. 41-50 Dietary pattern unlikely to meet recommendations for good health and room for improvement. 51-60 More healthful dietary pattern, with some room for improvement.  >60 Healthy dietary pattern, although there may be some specific behaviors that could be improved.    Nutrition Goals Re-Evaluation:  Nutrition Goals Re-Evaluation     [place]mith Name 12/06/21 1010 12/27/21 0959           Goals    Nutrition Goal ST: read lables of ready to eat canned foods for sodium content, make sure having three good sources of protein daily (if none at lunch have a boiled egg with snack), explore different frozen vegeables for variety  LT: limit Na <1.5g/day, eat rainbow of fruits and vegetables each week, meet protein needs. ST: read lables of ready to eat canned foods for sodium content, make sure having three good sources of protein daily (if none at lunch have a boiled egg with snack), explore different frozen vegeables for variety  LT: limit Na <1.5g/day, eat rainbow of fruits and vegetables each week, meet protein needs.      Comment Gary Mueller met with Melissa last week. He had a good appointment with her.  He got some tips to work on and getting variety. Overall, he eats well but is going to try reading labels and get enough protein and variety. Gary Mueller is doing well with his diet.  He does admit to eating a little more and not as good over the holidays.  He is planning to "behave" better now and continue to aim for variety and protein.  He is doing better about reading his food labels.      Expected Outcome ST: read lables of ready to eat canned foods for sodium content, make sure having three good sources of protein daily (if none at lunch have a boiled egg with snack), explore different frozen vegeables for variety  LT: limit Na <1.5g/day, eat rainbow of fruits and vegetables each week, meet protein needs. Short: Get back to his diet and watching sodium Long: conitnue to add in protein to maintain weight and muscle               Nutrition Goals Discharge (Final Nutrition Goals Re-Evaluation):  Nutrition Goals Re-Evaluation - 12/27/21 0959       Goals   Nutrition Goal ST: read lables of ready to eat canned foods for sodium content, make sure having three good sources of protein daily (if none at lunch have a boiled egg with snack), explore different frozen vegeables for variety  LT: limit Na <1.5g/day, eat  rainbow of fruits and vegetables each week, meet protein  needs.    Comment Gary Mueller is doing well with his diet.  He does admit to eating a little more and not as good over the holidays.  He is planning to "behave" better now and continue to aim for variety and protein.  He is doing better about reading his food labels.    Expected Outcome Short: Get back to his diet and watching sodium Long: conitnue to add in protein to maintain weight and muscle             Psychosocial: Target Goals: Acknowledge presence or absence of significant depression and/or stress, maximize coping skills, provide positive support system. Participant is able to verbalize types and ability to use techniques and skills needed for reducing stress and depression.   Education: Stress, Anxiety, and Depression - Group verbal and visual presentation to define topics covered.  Reviews how body is impacted by stress, anxiety, and depression.  Also discusses healthy ways to reduce stress and to treat/manage anxiety and depression.  Written material given at graduation. Flowsheet Row Cardiac Rehab from 01/12/2022 in [facility] Cardiac and Pulmonary Rehab  Date 12/08/21  Educator [facility]  Instruction Review Code 1- Verbalizes Understanding       Education: Sleep Hygiene -Provides group verbal and written instruction about how sleep can affect your health.  Define sleep hygiene, discuss sleep cycles and impact of sleep habits. Review good sleep hygiene tips.    Initial Review & Psychosocial Screening:  Initial Psych Review & Screening - 10/28/21 1448       Initial Review   Current issues with Current Stress Concerns    Source of Stress Concerns Family    Comments son passed 09/06/21; handicapped son still lives at home      [place]? Yes   daughter, neighbors     Barriers   Psychosocial barriers to participate in program The patient should benefit from training in stress management and relaxation.       Screening Interventions   Interventions Encouraged to exercise;Provide feedback about the scores to participant;To provide support and resources with identified psychosocial needs    Expected Outcomes Short Term goal: Utilizing psychosocial counselor, staff and physician to assist with identification of specific Stressors or current issues interfering with healing process. Setting desired goal for each stressor or current issue identified.;Long Term Goal: Stressors or current issues are controlled or eliminated.;Long Term goal: The participant improves quality of Life and PHQ9 Scores as seen by post scores and/or verbalization of changes;Short Term goal: Identification and review with participant of any Quality of Life or Depression concerns found by scoring the questionnaire.             Quality of Life Scores:   Quality of Life - 11/14/21 1608       Quality of Life   Select Quality of Life      Quality of Life Scores   Health/Function Pre 18.6 %    Socioeconomic Pre 26.25 %    Psych/Spiritual Pre 25.71 %    Family Pre 22.2 %    GLOBAL Pre 22.29 %            Scores of 19 and below usually indicate a poorer quality of life in these areas.  A difference of  2-3 points is a clinically meaningful difference.  A difference of 2-3 points in the total score of the Quality of Life Index has been associated with significant improvement in overall quality of life, self-image,  physical symptoms, and general health in studies assessing change in quality of life.  PHQ-9: Recent Review Flowsheet Data     Depression screen [facility] 2/9 11/14/2021 10/25/2021 01/31/2021 06/29/2020 02/07/2019   Decreased Interest 0 1 0 0 0   Down, Depressed, Hopeless 1 1 1  0 1   PHQ - 2 Score 1 2 1  0 1   Altered sleeping 0 1 0 - -   Tired, decreased energy 2 3 1  - -   Change in appetite 1 1 0 - -   Feeling bad or failure about yourself  0 0 0 - -   Trouble concentrating 0 0 0 - -   Moving slowly or fidgety/restless  0 0 0 - -   Suicidal thoughts 0 0 0 - -   PHQ-9 Score 4 7 2  - -   Difficult doing work/chores Somewhat difficult Somewhat difficult Not difficult at all - -      Interpretation of Total Score  Total Score Depression Severity:  1-4 = Minimal depression, 5-9 = Mild depression, 10-14 = Moderate depression, 15-19 = Moderately severe depression, 20-27 = Severe depression   Psychosocial Evaluation and Intervention:  Psychosocial Evaluation - 10/28/21 1500       Psychosocial Evaluation & Interventions   Interventions Encouraged to exercise with the program and follow exercise prescription;Stress management education    Comments Gary Mueller reports feeling well 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 Prior to his hospitalization, he was walking 8-10,000 steps a day and golfing three times a week. He wants to get back to that and is trying to work his way up to it. When asked about stress, he replied that he guesses he is under some and his doctor thinks he is as well. His 46 year old son passed away 09-14-2021 so he is still dealing with that. His daughter lives local and his handicapped son lives with him. He remembers brining his wife, who has now passed away, to [employer] and thinks this program will be good for him and will give him guidance.    Expected Outcomes Short: attend Cardiac Rehab for education and exercise. Long: develop and maintain positive self care habits.    Continue Psychosocial Services  Follow up required by staff             Psychosocial Re-Evaluation:  Psychosocial Re-Evaluation     [place] Name 12/06/21 1008 12/27/21 1001           Psychosocial Re-Evaluation   Current issues with Current Stress Concerns Current Stress Concerns      Comments Gary Mueller is doing well in rehab. He is still coping with losing his wife an son so close together.  He is planning to start to meeting with a therapist.  He feels that his living kids are still a great support system and help him care for his handicapped son  living with him too.  He feels good and denies depression but still has those hard to deal with feelings that he wants to meet to talk with therapist about as he copes with his greif.  He notes that he has a good life and a lot to still live for with grandkids too. Gary Mueller is doing well mentally.  He is working hard on his mental health.  He is trying to cope with his wife and son.  He is still waiting for a call back to meet with therapist.  He feels he has a lot to live for.  He will get an  appointment set up with a therapist that knows his doctor.      Expected Outcomes Short: Start meeting with therapist Long: Continue to focus on the positive. Short: Start meeting with therapist Long: Continue to focus on the positive.      Interventions Encouraged to attend Cardiac Rehabilitation for the exercise;Stress management education Encouraged to attend Cardiac Rehabilitation for the exercise;Stress management education      Continue Psychosocial Services  Follow up required by staff Follow up required by staff        Initial Review   Comments son passed 09/06/21; handicapped son still lives at home --               Psychosocial Discharge (Final Psychosocial Re-Evaluation):  Psychosocial Re-Evaluation - 12/27/21 1001       Psychosocial Re-Evaluation   Current issues with Current Stress Concerns    Comments Gary Mueller is doing well mentally.  He is working hard on his mental health.  He is trying to cope with his wife and son.  He is still waiting for a call back to meet with therapist.  He feels he has a lot to live for.  He will get an appointment set up with a therapist that knows his doctor.    Expected Outcomes Short: Start meeting with therapist Long: Continue to focus on the positive.    Interventions Encouraged to attend Cardiac Rehabilitation for the exercise;Stress management education    Continue Psychosocial Services  Follow up required by staff             Vocational  Rehabilitation: Provide vocational rehab assistance to qualifying candidates.   Vocational Rehab Evaluation & Intervention:  Vocational Rehab - 10/28/21 1448       Initial Vocational Rehab Evaluation & Intervention   Assessment shows need for Vocational Rehabilitation No             Education: Education Goals: Education classes will be provided on a variety of topics geared toward better understanding of heart health and risk factor modification. Participant will state understanding/return demonstration of topics presented as noted by education test scores.  Learning Barriers/Preferences:  Learning Barriers/Preferences - 10/28/21 1448       Learning Barriers/Preferences   Learning Barriers None    Learning Preferences None             General Cardiac Education Topics:  AED/CPR: - Group verbal and written instruction with the use of models to demonstrate the basic use of the AED with the basic ABC's of resuscitation.   Anatomy and Cardiac Procedures: - Group verbal and visual presentation and models provide information about basic cardiac anatomy and function. Reviews the testing methods done to diagnose heart disease and the outcomes of the test results. Describes the treatment choices: Medical Management, Angioplasty, or Coronary Bypass Surgery for treating various heart conditions including Myocardial Infarction, Angina, Valve Disease, and Cardiac Arrhythmias.  Written material given at graduation. Flowsheet Row Cardiac Rehab from 01/12/2022 in [facility] Cardiac and Pulmonary Rehab  Date 12/29/21  Educator SB  Instruction Review Code 1- Verbalizes Understanding       Medication Safety: - Group verbal and visual instruction to review commonly prescribed medications for heart and lung disease. Reviews the medication, class of the drug, and side effects. Includes the steps to properly store meds and maintain the prescription regimen.  Written material given at  graduation. Flowsheet Row Cardiac Rehab from 01/12/2022 in [facility] Cardiac and Pulmonary Rehab  Date 01/12/22  Educator  [facility]  Instruction Review Code 1- Verbalizes Understanding       Intimacy: - Group verbal instruction through game format to discuss how heart and lung disease can affect sexual intimacy. Written material given at graduation.. Flowsheet Row Cardiac Rehab from 01/12/2022 in [facility] Cardiac and Pulmonary Rehab  Date 12/22/21  Educator [facility]  Instruction Review Code 1- Verbalizes Understanding       Know Your Numbers and Heart Failure: - Group verbal and visual instruction to discuss disease risk factors for cardiac and pulmonary disease and treatment options.  Reviews associated critical values for Overweight/Obesity, Hypertension, Cholesterol, and Diabetes.  Discusses basics of heart failure: signs/symptoms and treatments.  Introduces Heart Failure Zone chart for action plan for heart failure.  Written material given at graduation. Flowsheet Row Cardiac Rehab from 01/12/2022 in [facility] Cardiac and Pulmonary Rehab  Date 11/24/21  Educator SB  Instruction Review Code 1- Verbalizes Understanding       Infection Prevention: - Provides verbal and written material to individual with discussion of infection control including proper hand washing and proper equipment cleaning during exercise session. Flowsheet Row Cardiac Rehab from 01/12/2022 in [facility] Cardiac and Pulmonary Rehab  Date 11/14/21  Educator AS  Instruction Review Code 1- Verbalizes Understanding       Falls Prevention: - Provides verbal and written material to individual with discussion of falls prevention and safety. Flowsheet Row Cardiac Rehab from 01/12/2022 in [facility] Cardiac and Pulmonary Rehab  Date 11/14/21  Educator AS  Instruction Review Code 1- Verbalizes Understanding       Other: -Provides group and verbal instruction on various topics (see comments)   Knowledge Questionnaire Score:  Knowledge  Questionnaire Score - 11/14/21 1609       Knowledge Questionnaire Score   Pre Score 24/26             Core Components/Risk Factors/Patient Goals at Admission:  Personal Goals and Risk Factors at Admission - 11/14/21 1602       Core Components/Risk Factors/Patient Goals on Admission   Hypertension Yes    Intervention Provide education on lifestyle modifcations including regular physical activity/exercise, weight management, moderate sodium restriction and increased consumption of fresh fruit, vegetables, and low fat dairy, alcohol moderation, and smoking cessation.;Monitor prescription use compliance.    Expected Outcomes Short Term: Continued assessment and intervention until BP is < 140/80mm HG in hypertensive participants. < 130/61mm HG in hypertensive participants with diabetes, heart failure or chronic kidney disease.;Long Term: Maintenance of blood pressure at goal levels.    Lipids Yes    Intervention Provide education and support for participant on nutrition & aerobic/resistive exercise along with prescribed medications to achieve LDL '70mg'$ , HDL >$Remo'40mg'LTMQP$ .    Expected Outcomes Short Term: Participant states understanding of desired cholesterol values and is compliant with medications prescribed. Participant is following exercise prescription and nutrition guidelines.;Long Term: Cholesterol controlled with medications as prescribed, with individualized exercise RX and with personalized nutrition plan. Value goals: LDL < $Rem'70mg'oCek$ , HDL > 40 mg.             Education:Diabetes - Individual verbal and written instruction to review signs/symptoms of diabetes, desired ranges of glucose level fasting, after meals and with exercise. Acknowledge that pre and post exercise glucose checks will be done for 3 sessions at entry of program.   Core Components/Risk Factors/Patient Goals Review:   Goals and Risk Factor Review     Row Name 12/06/21 1012 12/27/21 0956  Core  Components/Risk Factors/Patient Goals Review   Personal Goals Review Weight Management/Obesity;Hypertension;Lipids Weight Management/Obesity;Hypertension;Lipids      Review Gary Mueller is doing well in rehab.  He lost some weight initially post MI, but is now holding steady around 169-170 lb.  Prior to all this he was 175-176 lb and would like to at least continue to build his strength back up.  His blood pressures are doing well in class.  He does not check them at home as he had never had any issues with it prior to heart event.  He is staying active at home.  He really wants to get back to golf again so we talked about the progression from bucket of balls back to the course. Gary Mueller is doing well in rehab.  He has noted that his breathing is getting better with the PLB and his cardiac cough from meds has improved too. His weight is up 2 lbs since starting.  He is eating a little more and not as healthy with the holidays. He is hoping to hold steady here as he feels healthy here.  His pressures continue to do well and he continues to check it at home.      Expected Outcomes -- Short: Conitnue to maintain weight Long: Continue to monitor risk factors.               Core Components/Risk Factors/Patient Goals at Discharge (Final Review):   Goals and Risk Factor Review - 12/27/21 0956       Core Components/Risk Factors/Patient Goals Review   Personal Goals Review Weight Management/Obesity;Hypertension;Lipids    Review Gary Mueller is doing well in rehab.  He has noted that his breathing is getting better with the PLB and his cardiac cough from meds has improved too. His weight is up 2 lbs since starting.  He is eating a little more and not as healthy with the holidays. He is hoping to hold steady here as he feels healthy here.  His pressures continue to do well and he continues to check it at home.    Expected Outcomes Short: Conitnue to maintain weight Long: Continue to monitor risk factors.             ITP  Comments:  ITP Comments     Row Name 10/28/21 1444 11/14/21 1605 11/22/21 0957 11/23/21 0618 11/28/21 0854   ITP Comments Initial telephone orientation completed. Documentation can be found in [facility] 10/25. EP orientation scheduled for Monday 11/21 at 3pm. Completed and gym orientation. Initial ITP created and sent for review to Dr.Mark Hyacinth Meeker, Medical Director. First full day of exercise!  Patient was oriented to gym and equipment including functions, settings, policies, and procedures.  Patient's individual exercise prescription and treatment plan were reviewed.  All starting workloads were established based on the results of the 6 minute walk test done at initial orientation visit.  The plan for exercise progression was also introduced and progression will be customized based on patient's performance and goals. 30 Day review completed. Medical Director ITP review done, changes made as directed, and signed approval by Medical Director.   New to program Completed initial RD consultation    Row Name 12/21/21 [phone] 01/18/22 0806         ITP Comments 30 Day review completed. Medical Director ITP review done, changes made as directed, and signed approval by Medical Director. 30 Day review completed. Medical Director ITP review done, changes made as directed, and signed approval by Medical Director.  Comments:

## 2022-01-19 ENCOUNTER — Other Ambulatory Visit: Payer: Self-pay

## 2022-01-19 ENCOUNTER — Ambulatory Visit: Payer: Self-pay | Admitting: Family Medicine

## 2022-01-19 DIAGNOSIS — Z955 Presence of coronary angioplasty implant and graft: Secondary | ICD-10-CM | POA: Diagnosis not present

## 2022-01-19 DIAGNOSIS — I214 Non-ST elevation (NSTEMI) myocardial infarction: Secondary | ICD-10-CM

## 2022-01-19 NOTE — Progress Notes (Signed)
Daily Session Note  Patient Details  Name: Gary Mueller MRN: [id number] Date of Birth: July 03, 1941 Referring Provider:   Flowsheet Row Cardiac Rehab from 11/14/2021 in [facility] Cardiac and Pulmonary Rehab  Referring Provider Paraschos       Encounter Date: 01/19/2022  Check In:  Session Check In - 01/19/22 0948       Check-In   Supervising physician immediately available to respond to emergencies See telemetry face sheet for immediately available ER MD    Location [facility]-Cardiac & Pulmonary Rehab    Staff Present Birdie Sons, MPA, Mauricia Area, BS, ACSM CEP, Exercise Physiologist;Amanda Oletta Darter, BA, ACSM CEP, Exercise Physiologist    Virtual Visit No    Medication changes reported     No    Fall or balance concerns reported    No    Tobacco Cessation No Change    Warm-up and Cool-down Performed on first and last piece of equipment    Resistance Training Performed Yes    VAD Patient? No    PAD/SET Patient? No      Pain Assessment   Currently in Pain? No/denies                Social History   Tobacco Use  Smoking Status Former   Packs/day: 1.50   Types: Cigarettes   Quit date: 12/24/1964   Years since quitting: 57.1  Smokeless Tobacco Never    Goals Met:  Independence with exercise equipment Exercise tolerated well No report of concerns or symptoms today Strength training completed today  Goals Unmet:  Not Applicable  Comments: Pt able to follow exercise prescription today without complaint.  Will continue to monitor for progression.    Dr. Emily Filbert is Medical Director for [place].  Dr. Ottie Glazier is Medical Director for [facility] Pulmonary Rehabilitation.

## 2022-01-24 ENCOUNTER — Other Ambulatory Visit: Payer: Self-pay

## 2022-01-24 DIAGNOSIS — Z955 Presence of coronary angioplasty implant and graft: Secondary | ICD-10-CM

## 2022-01-24 DIAGNOSIS — I214 Non-ST elevation (NSTEMI) myocardial infarction: Secondary | ICD-10-CM

## 2022-01-24 NOTE — Progress Notes (Signed)
Daily Session Note  Patient Details  Name: Gary Mueller MRN: [id number] Date of Birth: 1941-08-27 Referring Provider:   Flowsheet Row Cardiac Rehab from 11/14/2021 in [facility] Cardiac and Pulmonary Rehab  Referring Provider Paraschos       Encounter Date: 01/24/2022  Check In:  Session Check In - 01/24/22 0950       Check-In   Supervising physician immediately available to respond to emergencies See telemetry face sheet for immediately available ER MD    Location [facility]-Cardiac & Pulmonary Rehab    Staff Present Birdie Sons, MPA, RN;Amanda Oletta Darter, BA, ACSM CEP, Exercise Physiologist;Jessica Fielding, MA, RCEP, CCRP, CCET    Virtual Visit No    Medication changes reported     No    Fall or balance concerns reported    No    Tobacco Cessation No Change    Warm-up and Cool-down Performed on first and last piece of equipment    Resistance Training Performed Yes    VAD Patient? No    PAD/SET Patient? No      Pain Assessment   Currently in Pain? No/denies                Social History   Tobacco Use  Smoking Status Former   Packs/day: 1.50   Types: Cigarettes   Quit date: 12/24/1964   Years since quitting: 57.1  Smokeless Tobacco Never    Goals Met:  Independence with exercise equipment Exercise tolerated well No report of concerns or symptoms today Strength training completed today  Goals Unmet:  Not Applicable  Comments: Pt able to follow exercise prescription today without complaint.  Will continue to monitor for progression.    Dr. Emily Filbert is Medical Director for [place].  Dr. Ottie Glazier is Medical Director for [facility] Pulmonary Rehabilitation.

## 2022-01-26 ENCOUNTER — Encounter: Payer: Medicare Other | Attending: Cardiology

## 2022-01-26 ENCOUNTER — Other Ambulatory Visit: Payer: Self-pay

## 2022-01-26 DIAGNOSIS — Z5189 Encounter for other specified aftercare: Secondary | ICD-10-CM | POA: Insufficient documentation

## 2022-01-26 DIAGNOSIS — I252 Old myocardial infarction: Secondary | ICD-10-CM | POA: Diagnosis not present

## 2022-01-26 DIAGNOSIS — Z955 Presence of coronary angioplasty implant and graft: Secondary | ICD-10-CM | POA: Diagnosis not present

## 2022-01-26 DIAGNOSIS — I214 Non-ST elevation (NSTEMI) myocardial infarction: Secondary | ICD-10-CM

## 2022-01-26 NOTE — Progress Notes (Signed)
Daily Session Note  Patient Details  Name: Gary Mueller MRN: [id number] Date of Birth: 03/26/1941 Referring Provider:   Flowsheet Row Cardiac Rehab from 11/14/2021 in [facility] Cardiac and Pulmonary Rehab  Referring Provider Paraschos       Encounter Date: 01/26/2022  Check In:  Session Check In - 01/26/22 0945       Check-In   Supervising physician immediately available to respond to emergencies See telemetry face sheet for immediately available ER MD    Location [facility]-Cardiac & Pulmonary Rehab    Staff Present Birdie Sons, MPA, Elveria Rising, BA, ACSM CEP, Exercise Physiologist;Bethene Hankinson Amedeo Plenty, BS, ACSM CEP, Exercise Physiologist    Virtual Visit No    Medication changes reported     No    Fall or balance concerns reported    No    Tobacco Cessation No Change    Warm-up and Cool-down Performed on first and last piece of equipment    Resistance Training Performed Yes    VAD Patient? No    PAD/SET Patient? No      Pain Assessment   Currently in Pain? No/denies                Social History   Tobacco Use  Smoking Status Former   Packs/day: 1.50   Types: Cigarettes   Quit date: 12/24/1964   Years since quitting: 57.1  Smokeless Tobacco Never    Goals Met:  Independence with exercise equipment Exercise tolerated well No report of concerns or symptoms today Strength training completed today  Goals Unmet:  Not Applicable  Comments: Pt able to follow exercise prescription today without complaint.  Will continue to monitor for progression.    Dr. Emily Filbert is Medical Director for [place].  Dr. Ottie Glazier is Medical Director for [facility] Pulmonary Rehabilitation.

## 2022-01-27 ENCOUNTER — Encounter: Payer: Self-pay | Admitting: Family Medicine

## 2022-01-28 ENCOUNTER — Encounter: Payer: Self-pay | Admitting: Family Medicine

## 2022-01-30 ENCOUNTER — Other Ambulatory Visit: Payer: Self-pay | Admitting: *Deleted

## 2022-01-30 ENCOUNTER — Other Ambulatory Visit: Payer: Self-pay | Admitting: Family Medicine

## 2022-01-30 DIAGNOSIS — I10 Essential (primary) hypertension: Secondary | ICD-10-CM

## 2022-01-30 MED ORDER — LOSARTAN POTASSIUM 50 MG PO TABS: 50.0000 mg | ORAL_TABLET | Freq: Every day | ORAL | 1 refills | Status: DC

## 2022-01-30 NOTE — Telephone Encounter (Signed)
Rx was sent to pharmacy. 

## 2022-01-31 ENCOUNTER — Other Ambulatory Visit: Payer: Self-pay

## 2022-01-31 VITALS — Ht 70.0 in | Wt 170.1 lb

## 2022-01-31 DIAGNOSIS — I214 Non-ST elevation (NSTEMI) myocardial infarction: Secondary | ICD-10-CM

## 2022-01-31 DIAGNOSIS — Z955 Presence of coronary angioplasty implant and graft: Secondary | ICD-10-CM | POA: Diagnosis not present

## 2022-01-31 DIAGNOSIS — Z5189 Encounter for other specified aftercare: Secondary | ICD-10-CM | POA: Diagnosis not present

## 2022-01-31 DIAGNOSIS — I252 Old myocardial infarction: Secondary | ICD-10-CM | POA: Diagnosis not present

## 2022-01-31 NOTE — Progress Notes (Signed)
Daily Session Note  Patient Details  Name: Gary Mueller MRN: [id number] Date of Birth: 1941/11/08 Referring Provider:   Flowsheet Row Cardiac Rehab from 11/14/2021 in [facility] Card[facility]monary Rehab  Referring Provider Paraschos       Encounter Date: 01/31/2022  Check In:  Session Check In - 01/31/22 4098       Check-In   Supervising physician immediately available to respond to emergencies See telemetry face sheet for immediately available ER MD    Location [facility]-Cardiac & Pulmonary Rehab    Staff Present Birdie Sons, MPA, RN;Amanda Oletta Darter, BA, ACSM CEP, Exercise Physiologist;Jessica Luan Pulling, MA, RCEP, CCRP, CCET    Virtual Visit No    Medication changes reported     Yes    Comments still off lisinopril no new med yet    Fall or balance concerns reported    No    Tobacco Cessation No Change    Warm-up and Cool-down Performed on first and last piece of equipment    Resistance Training Performed Yes    VAD Patient? No    PAD/SET Patient? No      Pain Assessment   Currently in Pain? No/denies                Social History   Tobacco Use  Smoking Status Former   Packs/day: 1.50   Types: Cigarettes   Quit date: 12/24/1964   Years since quitting: 57.1  Smokeless Tobacco Never    Goals Met:  Independence with exercise equipment Exercise tolerated well No report of concerns or symptoms today Strength training completed today  Goals Unmet:  Not Applicable  Comments: Pt able to follow exercise prescription today without complaint.  Will continue to monitor for progression.   Gary Mueller Name 11/14/21 1549 01/31/22 1021       6 Minute Walk   Phase Initial Discharge    Distance 1205 feet 1318 feet    Distance % Change -- 9.4 %    Distance Feet Change -- 113 ft    Walk Time 6 minutes 6 minutes    # of Rest Breaks 0 0    MPH 2.28 2.5    METS 2.1 2.19    RPE 11 11    Perceived Dyspnea  1 --    VO2 Peak 7.4 7.67    Symptoms No No     Resting HR 60 bpm 57 bpm    Resting BP 104/60 120/66    Resting Oxygen Saturation  98 % 93 %    Exercise Oxygen Saturation  during 6 min walk 97 % 93 %    Max Ex. HR 76 bpm 68 bpm    Max Ex. BP 132/60 128/64    2 Minute Post BP 114/58 --              Dr. Emily Filbert is Medical Director for [place].  Dr. Ottie Glazier is Medical Director for [facility] Pulmonary Rehabilitation.

## 2022-02-02 ENCOUNTER — Encounter: Payer: Self-pay | Admitting: Family Medicine

## 2022-02-02 ENCOUNTER — Other Ambulatory Visit: Payer: Self-pay

## 2022-02-02 DIAGNOSIS — I214 Non-ST elevation (NSTEMI) myocardial infarction: Secondary | ICD-10-CM

## 2022-02-02 DIAGNOSIS — Z955 Presence of coronary angioplasty implant and graft: Secondary | ICD-10-CM | POA: Diagnosis not present

## 2022-02-02 DIAGNOSIS — Z5189 Encounter for other specified aftercare: Secondary | ICD-10-CM | POA: Diagnosis not present

## 2022-02-02 DIAGNOSIS — I252 Old myocardial infarction: Secondary | ICD-10-CM | POA: Diagnosis not present

## 2022-02-02 NOTE — Patient Instructions (Signed)
Discharge Patient Instructions  Patient Details  Name: Gary Mueller MRN: [id number] Date of Birth: 11-Jan-1941 Referring Provider:  Isaias Cowman, MD   Number of Visits: 36  Reason for Discharge:  Patient reached a stable level of exercise. Patient independent in their exercise. Patient has met program and personal goals.  Smoking History:  Social History   Tobacco Use  Smoking Status Former   Packs/day: 1.50   Types: Cigarettes   Quit date: 12/24/1964   Years since quitting: 57.1  Smokeless Tobacco Never    Diagnosis:  NSTEMI (non-ST elevated myocardial infarction) ([place])  Status post coronary artery stent placement  Initial Exercise Prescription:  Initial Exercise Prescription - 11/14/21 1500       Date of Initial Exercise RX and Referring Provider   Date 11/14/21    Referring Provider Paraschos      Oxygen   Maintain Oxygen Saturation 88% or higher      Treadmill   MPH 2    Grade 0.5    Minutes 15    METs 2.6      NuStep   Level 2    SPM 80    Minutes 15    METs 2.1      Recumbant Elliptical   Level 1    RPM 50    Minutes 15    METs 2.1      Biostep-RELP   Level 2    SPM 50    Minutes 15    METs 2      Prescription Details   Frequency (times per week) 3    Duration Progress to 30 minutes of continuous aerobic without signs/symptoms of physical distress      Intensity   THRR 40-80% of Max Heartrate 91-122    Ratings of Perceived Exertion 11-13    Perceived Dyspnea 0-4      Resistance Training   Training Prescription Yes    Weight 3 lb    Reps 10-15             Discharge Exercise Prescription (Final Exercise Prescription Changes):  Exercise Prescription Changes - 01/30/22 0900       Response to Exercise   Blood Pressure (Admit) 124/58    Blood Pressure (Exit) 100/54    Heart Rate (Admit) 55 bpm    Heart Rate (Exercise) 84 bpm    Heart Rate (Exit) 73 bpm    Rating of Perceived Exertion (Exercise) 13    Symptoms  none    Duration Continue with 30 min of aerobic exercise without signs/symptoms of physical distress.    Intensity THRR unchanged      Progression   Progression Continue to progress workloads to maintain intensity without signs/symptoms of physical distress.    Average METs 3.35      Resistance Training   Training Prescription Yes    Weight 5 lb    Reps 10-15      Interval Training   Interval Training No      Treadmill   MPH 1.7    Grade 6    Minutes 15    METs 3.71      Biostep-RELP   Level 2    Minutes 15    METs 3      Home Exercise Plan   Plans to continue exercise at Home (comment)   walking, staff videos   Frequency Add 2 additional days to program exercise sessions.    Initial Home Exercises Provided 12/06/21  Functional Capacity:  6 Minute Walk     Row Name 11/14/21 1549 01/31/22 1021       6 Minute Walk   Phase Initial Discharge    Distance 1205 feet 1318 feet    Distance % Change -- 9.4 %    Distance Feet Change -- 113 ft    Walk Time 6 minutes 6 minutes    # of Rest Breaks 0 0    MPH 2.28 2.5    METS 2.1 2.19    RPE 11 11    Perceived Dyspnea  1 --    VO2 Peak 7.4 7.67    Symptoms No No    Resting HR 60 bpm 57 bpm    Resting BP 104/60 120/66    Resting Oxygen Saturation  98 % 93 %    Exercise Oxygen Saturation  during 6 min walk 97 % 93 %    Max Ex. HR 76 bpm 68 bpm    Max Ex. BP 132/60 128/64    2 Minute Post BP 114/58 --              Nutrition & Weight - Outcomes:  Pre Biometrics - 11/14/21 1601       Pre Biometrics   Height '5\' 10"'  (1.778 m)    Weight 168 lb 1.6 oz (76.2 kg)    BMI (Calculated) 24.12    Single Leg Stand 2.2 seconds             Post Biometrics - 01/31/22 1022        Post  Biometrics   Height '5\' 10"'  (1.778 m)    Weight 170 lb 1.6 oz (77.2 kg)    BMI (Calculated) 24.41    Single Leg Stand 4.3 seconds             Nutrition:  Nutrition Therapy & Goals - 11/28/21 0818        Nutrition Therapy   Diet Heart healthy, low Na    Drug/Food Interactions Statins/Certain Fruits    Protein (specify units) 60g    Fiber 30 grams    Whole Grain Foods 3 servings    Saturated Fats 12 max. grams    Fruits and Vegetables 8 servings/day      Personal Nutrition Goals   Nutrition Goal ST: read lables of ready to eat canned foods for sodium content, make sure having three good sources of protein daily (if none at lunch have a boiled egg with snack), explore different frozen vegeables for variety  LT: limit Na <1.5g/day, eat rainbow of fruits and vegetables each week, meet protein needs.    Comments He prepares many of his own meals, but he is not a good cook; he eats lots of legumes, grains, and fruit/vegetables. B: coffee with some honey with his medications and yogurt L: cup of soup or sandwich (pimento cheese or pb and J on whole wheat bread) S: crackers or an apple - he also loves blueberries S: lightly salted pecans D: sandwich or protein like pork, beans (3-4x/week), chicken. Chicken pot pie, soup (cambell soup). 2x/week he will have steamed brussels sprouts or broccoli. He likes salads with chicken when he goes out to eat. S: sometimes will have dessert like piece of cheesecake or pie or angel food cake Drinks: water, orange juice. He does not add salt to his foods. Discussed heart healthy eating.      Intervention Plan   Intervention Prescribe, educate and counsel regarding individualized specific dietary modifications  aiming towards targeted core components such as weight, hypertension, lipid management, diabetes, heart failure and other comorbidities.;Nutrition handout(s) given to patient.    Expected Outcomes Short Term Goal: Understand basic principles of dietary content, such as calories, fat, sodium, cholesterol and nutrients.;Short Term Goal: A plan has been developed with personal nutrition goals set during dietitian appointment.;Long Term Goal: Adherence to prescribed  nutrition plan.              Goals reviewed with patient; copy given to patient.

## 2022-02-02 NOTE — Progress Notes (Signed)
Daily Session Note  Patient Details  Name: RUAL VERMEER MRN: [id number] Date of Birth: [birth year] Referring Provider:   Flowsheet Row Cardiac Rehab from 11/14/2021 in [facility] Cardiac and Pulmonary Rehab  Referring Provider Paraschos       Encounter Date: 02/02/2022  Check In:  Session Check In - 02/02/22 7939       Check-In   Supervising physician immediately available to respond to emergencies See telemetry face sheet for immediately available ER MD    Location [facility]-Cardiac & Pulmonary Rehab    Staff Present Birdie Sons, MPA, Elveria Rising, BA, ACSM CEP, Exercise Physiologist;Ravinder Hofland Amedeo Plenty, BS, ACSM CEP, Exercise Physiologist    Virtual Visit No    Medication changes reported     Yes    Comments added 74m losartan daily    Fall or balance concerns reported    No    Tobacco Cessation No Change    Warm-up and Cool-down Performed on first and last piece of equipment    Resistance Training Performed Yes    VAD Patient? No    PAD/SET Patient? No      Pain Assessment   Currently in Pain? No/denies                Social History   Tobacco Use  Smoking Status Former   Packs/day: 1.50   Types: Cigarettes   Quit date: 12/24/1964   Years since quitting: 57.1  Smokeless Tobacco Never    Goals Met:  Independence with exercise equipment Exercise tolerated well No report of concerns or symptoms today Strength training completed today  Goals Unmet:  Not Applicable  Comments: Pt able to follow exercise prescription today without complaint.  Will continue to monitor for progression.    Dr. MEmily Filbertis [facility] Director [facility]  Dr. FOttie Glazieris Medical Director [facility].

## 2022-02-03 ENCOUNTER — Other Ambulatory Visit: Payer: Self-pay | Admitting: *Deleted

## 2022-02-06 ENCOUNTER — Other Ambulatory Visit: Payer: Self-pay | Admitting: Family Medicine

## 2022-02-07 ENCOUNTER — Other Ambulatory Visit: Payer: Self-pay

## 2022-02-07 DIAGNOSIS — Z955 Presence of coronary angioplasty implant and graft: Secondary | ICD-10-CM | POA: Diagnosis not present

## 2022-02-07 DIAGNOSIS — R5383 Other fatigue: Secondary | ICD-10-CM | POA: Diagnosis not present

## 2022-02-07 DIAGNOSIS — E785 Hyperlipidemia, unspecified: Secondary | ICD-10-CM | POA: Diagnosis not present

## 2022-02-07 DIAGNOSIS — I214 Non-ST elevation (NSTEMI) myocardial infarction: Secondary | ICD-10-CM

## 2022-02-07 DIAGNOSIS — R0602 Shortness of breath: Secondary | ICD-10-CM | POA: Diagnosis not present

## 2022-02-07 DIAGNOSIS — Z5189 Encounter for other specified aftercare: Secondary | ICD-10-CM | POA: Diagnosis not present

## 2022-02-07 DIAGNOSIS — I2510 Atherosclerotic heart disease of native coronary artery without angina pectoris: Secondary | ICD-10-CM | POA: Diagnosis not present

## 2022-02-07 DIAGNOSIS — I252 Old myocardial infarction: Secondary | ICD-10-CM | POA: Diagnosis not present

## 2022-02-07 DIAGNOSIS — I493 Ventricular premature depolarization: Secondary | ICD-10-CM | POA: Diagnosis not present

## 2022-02-07 DIAGNOSIS — I10 Essential (primary) hypertension: Secondary | ICD-10-CM | POA: Diagnosis not present

## 2022-02-07 NOTE — Progress Notes (Signed)
Daily Session Note  Patient Details  Name: Gary Mueller MRN: [id number] Date of Birth: 1941-06-03 Referring Provider:   Flowsheet Row Cardiac Rehab from 11/14/2021 in [facility] Cardiac and Pulmonary Rehab  Referring Provider Paraschos       Encounter Date: 02/07/2022  Check In:  Session Check In - 02/07/22 0944       Check-In   Supervising physician immediately available to respond to emergencies See telemetry face sheet for immediately available ER MD    Location [facility]-Cardiac & Pulmonary Rehab    Staff Present Birdie Sons, MPA, RN;Jessica Alden, MA, RCEP, CCRP, CCET;Amanda Sommer, BA, ACSM CEP, Exercise Physiologist    Virtual Visit No    Medication changes reported     Yes    Comments cut losartan to 37m daily    Fall or balance concerns reported    No    Tobacco Cessation No Change    Warm-up and Cool-down Performed on first and last piece of equipment    Resistance Training Performed Yes    VAD Patient? No    PAD/SET Patient? No      Pain Assessment   Currently in Pain? No/denies                Social History   Tobacco Use  Smoking Status Former   Packs/day: 1.50   Types: Cigarettes   Quit date: 12/24/1964   Years since quitting: 57.1  Smokeless Tobacco Never    Goals Met:  Independence with exercise equipment Exercise tolerated well No report of concerns or symptoms today Strength training completed today  Goals Unmet:  Not Applicable  Comments: Pt able to follow exercise prescription today without complaint.  Will continue to monitor for progression.    Dr. MEmily Filbertis Medical Director for [facility]  Dr. FOttie Glazieris Medical Director for [facility].

## 2022-02-09 ENCOUNTER — Other Ambulatory Visit: Payer: Self-pay

## 2022-02-09 DIAGNOSIS — I252 Old myocardial infarction: Secondary | ICD-10-CM | POA: Diagnosis not present

## 2022-02-09 DIAGNOSIS — I214 Non-ST elevation (NSTEMI) myocardial infarction: Secondary | ICD-10-CM

## 2022-02-09 DIAGNOSIS — Z5189 Encounter for other specified aftercare: Secondary | ICD-10-CM | POA: Diagnosis not present

## 2022-02-09 DIAGNOSIS — Z955 Presence of coronary angioplasty implant and graft: Secondary | ICD-10-CM | POA: Diagnosis not present

## 2022-02-09 NOTE — Progress Notes (Signed)
Daily Session Note ° °Patient Details  °Name: Gary Mueller °MRN: [id number] °Date of Birth: 11/01/1941 °Referring Provider:   °Flowsheet Row Cardiac Rehab from 11/14/2021 in [facility] Cardiac and Pulmonary Rehab  °Referring Provider Paraschos  ° °  ° ° °Encounter Date: 02/09/2022 ° °Check In: ° Session Check In - 02/09/22 0939   ° °  ° Check-In  ° Supervising physician immediately available to respond to emergencies See telemetry face sheet for immediately available ER MD   ° Location [facility]-Cardiac & Pulmonary Rehab   ° Staff Present Kelly Bollinger, MPA, RN;Kara Langdon, MS, ASCM CEP, Exercise Physiologist;Amanda Sommer, BA, ACSM CEP, Exercise Physiologist;Melissa Caiola, RDN, LDN   ° Virtual Visit No   ° Medication changes reported     No   ° Fall or balance concerns reported    No   ° Tobacco Cessation No Change   ° Warm-up and Cool-down Performed on first and last piece of equipment   ° Resistance Training Performed Yes   ° VAD Patient? No   ° PAD/SET Patient? No   °  ° Pain Assessment  ° Currently in Pain? No/denies   ° °  °  ° °  ° ° ° ° ° °Social History  ° °Tobacco Use  °Smoking Status Former  ° Packs/day: 1.50  ° Types: Cigarettes  ° Quit date: 12/24/1964  ° Years since quitting: 57.1  °Smokeless Tobacco Never  ° ° °Goals Met:  °Independence with exercise equipment °Exercise tolerated well °No report of concerns or symptoms today °Strength training completed today ° °Goals Unmet:  °Not Applicable ° °Comments: Pt able to follow exercise prescription today without complaint.  Will continue to monitor for progression. ° ° ° °Dr. Mark Miller is Medical Director for [facility].  °Dr. Fuad Aleskerov is Medical Director for [facility]. °

## 2022-02-14 ENCOUNTER — Telehealth: Payer: Self-pay | Admitting: Family Medicine

## 2022-02-14 NOTE — Telephone Encounter (Signed)
Copied from [place] [id number]. Topic: Medicare AWV >> Feb 14, 2022  2:02 PM Cher Nakai R wrote: Reason for CRM:  Left message for patient to call back and schedule Medicare Annual Wellness Visit (AWV) in office.   If not able to come in office, please offer to do virtually or by telephone.   Last AWV:  06/29/2020  Please schedule at anytime with [facility] Health Advisor.  If any questions, please contact me at [phone]

## 2022-02-15 ENCOUNTER — Encounter: Payer: Self-pay | Admitting: *Deleted

## 2022-02-15 ENCOUNTER — Other Ambulatory Visit: Payer: Self-pay | Admitting: Family Medicine

## 2022-02-15 DIAGNOSIS — Z955 Presence of coronary angioplasty implant and graft: Secondary | ICD-10-CM

## 2022-02-15 DIAGNOSIS — I214 Non-ST elevation (NSTEMI) myocardial infarction: Secondary | ICD-10-CM

## 2022-02-15 NOTE — Progress Notes (Signed)
Cardiac Individual Treatment Plan  Patient Details  Name: Gary Mueller MRN: [id number] Date of Birth: 1941-03-20 Referring Provider:   Flowsheet Row Cardiac Rehab from 11/14/2021 in [facility] Cardiac and Pulmonary Rehab  Referring Provider Paraschos       Initial Encounter Date:  Flowsheet Row Cardiac Rehab from 11/14/2021 in [facility] Cardiac and Pulmonary Rehab  Date 11/14/21       Visit Diagnosis: NSTEMI (non-ST elevated myocardial infarction) [facility])  Status post coronary artery stent placement  Patient's Home Medications on Admission:  Current Outpatient Medications:    losartan (COZAAR) 50 MG tablet, Take 1 tablet (50 mg total) by mouth daily., Disp: 30 tablet, Rfl: 1   acetaminophen (TYLENOL) 500 MG tablet, Take 1,000 mg by mouth daily as needed for moderate pain., Disp: , Rfl:    amLODipine (NORVASC) 5 MG tablet, TAKE ONE TABLET EVERY DAY, Disp: 90 tablet, Rfl: 0   ascorbic acid (VITAMIN C) 1000 MG tablet, Take 1 tablet by mouth daily., Disp: , Rfl:    aspirin EC 81 MG EC tablet, Take 1 tablet (81 mg total) by mouth daily. Swallow whole., Disp: 30 tablet, Rfl: 0   folic acid (FOLVITE) 1 MG tablet, Take 1 mg by mouth daily., Disp: , Rfl:    levothyroxine (SYNTHROID) 100 MCG tablet, TAKE 1 TABLET EVERY DAY ON EMPTY STOMACHWITH A GLASS OF WATER AT LEAST 30-60 MINBEFORE BREAKFAST, Disp: 90 tablet, Rfl: 0   methotrexate 2.5 MG tablet, Take 15 mg by mouth every Wednesday., Disp: , Rfl:    metoprolol succinate (TOPROL-XL) 25 MG 24 hr tablet, TAKE 1/2 TABLET BY MOUTH EVERY EVENING, Disp: 15 tablet, Rfl: 0   Multiple Vitamin (MULTIVITAMIN WITH MINERALS) TABS tablet, Take 1 tablet by mouth every evening. , Disp: , Rfl:    nitroGLYCERIN (NITROSTAT) 0.4 MG SL tablet, Place 1 tablet (0.4 mg total) under the tongue every 5 (five) minutes as needed for chest pain., Disp: 30 tablet, Rfl: 0   pantoprazole (PROTONIX) 40 MG tablet, TAKE ONE TABLET (40 MG) BY MOUTH EVERY DAY, Disp: 90 tablet, Rfl: 1    rosuvastatin (CRESTOR) 40 MG tablet, TAKE ONE TABLET BY MOUTH EVERY DAY, Disp: 90 tablet, Rfl: 3   ticagrelor (BRILINTA) 90 MG TABS tablet, Take 1 tablet (90 mg total) by mouth 2 (two) times daily., Disp: 60 tablet, Rfl: 0   vitamin E 180 MG (400 UNITS) capsule, Take 1 capsule by mouth daily., Disp: , Rfl:   Past Medical History: Past Medical History:  Diagnosis Date   Allergy    Anxiety    Arthritis    Cancer ([place]) 10/20/2016   Basal Cell Skin Cancer; lip, neck   Cystoid macular edema of left eye 07/07/2019   Depression    Dysrhythmia    A FIB   GERD (gastroesophageal reflux disease)    HOH (hard of hearing)    Bilateral   Hyperlipidemia    Hypertension    Patient denies   Hypothyroidism    IBS (irritable bowel syndrome)    Inguinal hernia    bilateral   Pneumonia    Thyroid disease    Tinnitus of both ears     Tobacco Use: Social History   Tobacco Use  Smoking Status Former   Packs/day: 1.50   Types: Cigarettes   Quit date: 12/24/1964   Years since quitting: 3.1  Smokeless Tobacco Never    Labs: Recent Review Scientist, physiological     Labs for ITP Cardiac and Pulmonary Rehab Latest Ref  Rng & Units 01/10/2018 06/16/2019 01/28/2020 02/01/2021 10/26/2021   Cholestrol 100 - 199 mg/dL 212(H) 150 193 201(H) 154   LDLCALC 0 - 99 mg/dL 121(H) 81 134(H) 139(H) 99   HDL >39 mg/dL 33(L) 49 40 40 38(L)   Trlycerides 0 - 149 mg/dL 290(H) 101 103 119 89        Exercise Target Goals: Exercise Program Goal: Individual exercise prescription set using results from initial 6 min walk test and THRR while considering  patients activity barriers and safety.   Exercise Prescription Goal: Initial exercise prescription builds to 30-45 minutes a day of aerobic activity, 2-3 days per week.  Home exercise guidelines will be given to patient during program as part of exercise prescription that the participant will acknowledge.   Education: Aerobic Exercise: - Group verbal and visual  presentation on the components of exercise prescription. Introduces F.I.T.T principle from ACSM for exercise prescriptions.  Reviews F.I.T.T. principles of aerobic exercise including progression. Written material given at graduation. Flowsheet Row Cardiac Rehab from 02/09/2022 in [facility] Cardiac and Pulmonary Rehab  Date 12/22/21  Educator [facility]  Instruction Review Code 1- Verbalizes Understanding       Education: Resistance Exercise: - Group verbal and visual presentation on the components of exercise prescription. Introduces F.I.T.T principle from ACSM for exercise prescriptions  Reviews F.I.T.T. principles of resistance exercise including progression. Written material given at graduation. Flowsheet Row Cardiac Rehab from 02/09/2022 in [facility] Cardiac and Pulmonary Rehab  Date 12/29/21  Educator [facility]  Instruction Review Code 1- Verbalizes Understanding        Education: Exercise & Equipment Safety: - Individual verbal instruction and demonstration of equipment use and safety with use of the equipment. Flowsheet Row Cardiac Rehab from 02/09/2022 in [facility] Cardiac and Pulmonary Rehab  Date 11/14/21  Educator [facility]  Instruction Review Code 1- Verbalizes Understanding       Education: Exercise Physiology & General Exercise Guidelines: - Group verbal and written instruction with models to review the exercise physiology of the cardiovascular system and associated critical values. Provides general exercise guidelines with specific guidelines to those with heart or lung disease.  Flowsheet Row Cardiac Rehab from 02/09/2022 in [facility] Cardiac and Pulmonary Rehab  Education need identified 11/14/21  Date 12/15/21  Educator [facility]  Instruction Review Code 1- [employer] Understanding       Education: Flexibility, Balance, Mind/Body Relaxation: - Group verbal and visual presentation with interactive activity on the components of exercise prescription. Introduces F.I.T.T principle from ACSM for exercise  prescriptions. Reviews F.I.T.T. principles of flexibility and balance exercise training including progression. Also discusses the mind body connection.  Reviews various relaxation techniques to help reduce and manage stress (i.e. Deep breathing, progressive muscle relaxation, and visualization). Balance handout provided to take home. Written material given at graduation.   Activity Barriers & Risk Stratification:  Activity Barriers & Cardiac Risk Stratification - 10/28/21 1444       Activity Barriers & Cardiac Risk Stratification   Activity Barriers Left Knee Replacement;Right Knee Replacement    Cardiac Risk Stratification Moderate             6 Minute Walk:  6 Minute Walk     Row Name 11/14/21 1549 01/31/22 1021       6 Minute Walk   Phase Initial Discharge    Distance 1205 feet 1318 feet    Distance % Change -- 9.4 %    Distance Feet Change -- 113 ft    Walk Time 6 minutes 6  minutes    # of Rest Breaks 0 0    MPH 2.28 2.5    METS 2.1 2.19    RPE 11 11    Perceived Dyspnea  1 --    VO2 Peak 7.4 7.67    Symptoms No No    Resting HR 60 bpm 57 bpm    Resting BP 104/60 120/66    Resting Oxygen Saturation  98 % 93 %    Exercise Oxygen Saturation  during 6 min walk 97 % 93 %    Max Ex. HR 76 bpm 68 bpm    Max Ex. BP 132/60 128/64    2 Minute Post BP 114/58 --             Oxygen Initial Assessment:   Oxygen Re-Evaluation:   Oxygen Discharge (Final Oxygen Re-Evaluation):   Initial Exercise Prescription:  Initial Exercise Prescription - 11/14/21 1500       Date of Initial Exercise RX and Referring Provider   Date 11/14/21    Referring Provider Paraschos      Oxygen   Maintain Oxygen Saturation 88% or higher      Treadmill   MPH 2    Grade 0.5    Minutes 15    METs 2.6      NuStep   Level 2    SPM 80    Minutes 15    METs 2.1      Recumbant Elliptical   Level 1    RPM 50    Minutes 15    METs 2.1      Biostep-RELP   Level 2    SPM 50     Minutes 15    METs 2      Prescription Details   Frequency (times per week) 3    Duration Progress to 30 minutes of continuous aerobic without signs/symptoms of physical distress      Intensity   THRR 40-80% of Max Heartrate 91-122    Ratings of Perceived Exertion 11-13    Perceived Dyspnea 0-4      Resistance Training   Training Prescription Yes    Weight 3 lb    Reps 10-15             Perform Capillary Blood Glucose checks as needed.  Exercise Prescription Changes:   Exercise Prescription Changes     Row Name 11/14/21 1600 12/05/21 1400 12/21/21 0800 01/02/22 1300 01/16/22 1000     Response to Exercise   Blood Pressure (Admit) 104/60 118/64 112/60 132/58 122/64   Blood Pressure (Exercise) 132/60 128/60 130/70 -- --   Blood Pressure (Exit) 114/58 120/60 118/62 136/62 120/62   Heart Rate (Admit) 60 bpm 57 bpm 59 bpm 67 bpm 60 bpm   Heart Rate (Exercise) 76 bpm 78 bpm 105 bpm 90 bpm 81 bpm   Heart Rate (Exit) 61 bpm 70 bpm 86 bpm 69 bpm 78 bpm   Oxygen Saturation (Admit) 98 % -- -- -- --   Oxygen Saturation (Exercise) 97 % -- -- -- --   Rating of Perceived Exertion (Exercise) '11 12 12 13 13   ' Perceived Dyspnea (Exercise) 1 -- -- -- --   Symptoms none none none none none   Comments -- fifth day exercise -- -- --   Duration -- Continue with 30 min of aerobic exercise without signs/symptoms of physical distress. Continue with 30 min of aerobic exercise without signs/symptoms of physical distress. Continue with 30 min of aerobic exercise  without signs/symptoms of physical distress. Continue with 30 min of aerobic exercise without signs/symptoms of physical distress.   Intensity -- THRR unchanged THRR unchanged THRR unchanged THRR unchanged     Progression   Progression -- Continue to progress workloads to maintain intensity without signs/symptoms of physical distress. Continue to progress workloads to maintain intensity without signs/symptoms of physical distress.  Continue to progress workloads to maintain intensity without signs/symptoms of physical distress. Continue to progress workloads to maintain intensity without signs/symptoms of physical distress.   Average METs -- 2.74 3.02 2.9 3.4     Resistance Training   Training Prescription -- Yes Yes Yes Yes   Weight -- 3 lb 5 lb 5 lb 5 lb   Reps -- 10-15 10-15 10-15 10-15     Interval Training   Interval Training -- -- No -- No     Treadmill   MPH -- -- 2.5 -- 2.3   Grade -- -- 1 -- 3   Minutes -- -- 15 -- 15   METs -- -- 3.26 -- 3.71     NuStep   Level -- 4 5 -- 5   Minutes -- 15 15 -- 15   METs -- 3.3 3.2 -- 3.4     Recumbant Elliptical   Level -- -- 3.5 -- 4   Minutes -- -- 15 -- 15   METs -- -- 2.6 -- 1.7     REL-XR   Level -- -- -- -- 1   Minutes -- -- -- -- 15   METs -- -- -- -- 2.4     Biostep-RELP   Level -- 2 2 -- --   Minutes -- 15 15 -- --   METs -- 2 -- -- --     Home Exercise Plan   Plans to continue exercise at -- -- Home (comment)  walking, staff videos -- Home (comment)  walking, staff videos   Frequency -- -- Add 2 additional days to program exercise sessions. -- Add 2 additional days to program exercise sessions.   Initial Home Exercises Provided -- -- 12/06/21 -- 12/06/21    Row Name 01/30/22 0900 02/14/22 0800           Response to Exercise   Blood Pressure (Admit) 124/58 130/54      Blood Pressure (Exit) 100/54 100/60      Heart Rate (Admit) 55 bpm 58 bpm      Heart Rate (Exercise) 84 bpm 72 bpm      Heart Rate (Exit) 73 bpm 70 bpm      Oxygen Saturation (Admit) -- 97 %      Oxygen Saturation (Exercise) -- 94 %      Oxygen Saturation (Exit) -- 96 %      Rating of Perceived Exertion (Exercise) 13 14      Symptoms none none      Duration Continue with 30 min of aerobic exercise without signs/symptoms of physical distress. Continue with 30 min of aerobic exercise without signs/symptoms of physical distress.      Intensity THRR unchanged THRR  unchanged        Progression   Progression Continue to progress workloads to maintain intensity without signs/symptoms of physical distress. Continue to progress workloads to maintain intensity without signs/symptoms of physical distress.      Average METs 3.35 3.88        Resistance Training   Training Prescription Yes Yes      Weight 5 lb 5 lb  Reps 10-15 10-15        Interval Training   Interval Training No No        Treadmill   MPH 1.7 2.6      Grade 6 3      Minutes 15 15      METs 3.71 4.66        Recumbant Elliptical   Level -- 1      Minutes -- 15      METs -- 1.7        REL-XR   Level -- 9      Minutes -- 15      METs -- 4.9        Biostep-RELP   Level 2 --      Minutes 15 --      METs 3 --        Home Exercise Plan   Plans to continue exercise at Home (comment)  walking, staff videos Home (comment)  walking, staff videos      Frequency Add 2 additional days to program exercise sessions. Add 2 additional days to program exercise sessions.      Initial Home Exercises Provided 12/06/21 12/06/21               Exercise Comments:   Exercise Comments     Row Name 11/22/21 0957           Exercise Comments First full day of exercise!  Patient was oriented to gym and equipment including functions, settings, policies, and procedures.  Patient's individual exercise prescription and treatment plan were reviewed.  All starting workloads were established based on the results of the 6 minute walk test done at initial orientation visit.  The plan for exercise progression was also introduced and progression will be customized based on patient's performance and goals.                Exercise Goals and Review:   Exercise Goals     Row Name 11/14/21 1600             Exercise Goals   Increase Physical Activity Yes       Intervention Provide advice, education, support and counseling about physical activity/exercise needs.;Develop an individualized  exercise prescription for aerobic and resistive training based on initial evaluation findings, risk stratification, comorbidities and participant's personal goals.       Expected Outcomes Short Term: Attend rehab on a regular basis to increase amount of physical activity.;Long Term: Add in home exercise to make exercise part of routine and to increase amount of physical activity.;Long Term: Exercising regularly at least 3-5 days a week.       Increase Strength and Stamina Yes       Intervention Provide advice, education, support and counseling about physical activity/exercise needs.;Develop an individualized exercise prescription for aerobic and resistive training based on initial evaluation findings, risk stratification, comorbidities and participant's personal goals.       Expected Outcomes Short Term: Increase workloads from initial exercise prescription for resistance, speed, and METs.;Short Term: Perform resistance training exercises routinely during rehab and add in resistance training at home;Long Term: Improve cardiorespiratory fitness, muscular endurance and strength as measured by increased METs and functional capacity (6MWT)       Able to understand and use rate of perceived exertion (RPE) scale Yes       Intervention Provide education and explanation on how to use RPE scale       Expected Outcomes Short  Term: Able to use RPE daily in rehab to express subjective intensity level;Long Term:  Able to use RPE to guide intensity level when exercising independently       Able to understand and use Dyspnea scale Yes       Intervention Provide education and explanation on how to use Dyspnea scale       Expected Outcomes Short Term: Able to use Dyspnea scale daily in rehab to express subjective sense of shortness of breath during exertion;Long Term: Able to use Dyspnea scale to guide intensity level when exercising independently       Knowledge and understanding of Target Heart Rate Range (THRR) Yes        Intervention Provide education and explanation of THRR including how the numbers were predicted and where they are located for reference       Expected Outcomes Short Term: Able to state/look up THRR;Short Term: Able to use daily as guideline for intensity in rehab;Long Term: Able to use THRR to govern intensity when exercising independently       Able to check pulse independently Yes       Intervention Provide education and demonstration on how to check pulse in carotid and radial arteries.;Review the importance of being able to check your own pulse for safety during independent exercise       Expected Outcomes Short Term: Able to explain why pulse checking is important during independent exercise;Long Term: Able to check pulse independently and accurately       Understanding of Exercise Prescription Yes       Intervention Provide education, explanation, and written materials on patient's individual exercise prescription       Expected Outcomes Short Term: Able to explain program exercise prescription;Long Term: Able to explain home exercise prescription to exercise independently                Exercise Goals Re-Evaluation :  Exercise Goals Re-Evaluation     Row Name 11/22/21 0957 12/05/21 1401 12/06/21 1007 12/21/21 0810 12/27/21 0955     Exercise Goal Re-Evaluation   Exercise Goals Review Increase Physical Activity;Able to understand and use rate of perceived exertion (RPE) scale;Knowledge and understanding of Target Heart Rate Range (THRR);Understanding of Exercise Prescription;Increase Strength and Stamina;Able to understand and use Dyspnea scale;Able to check pulse independently Increase Physical Activity;Increase Strength and Stamina Increase Physical Activity;Increase Strength and Stamina;Able to understand and use rate of perceived exertion (RPE) scale;Able to understand and use Dyspnea scale;Knowledge and understanding of Target Heart Rate Range (THRR);Able to check pulse  independently;Understanding of Exercise Prescription Increase Physical Activity;Increase Strength and Stamina;Understanding of Exercise Prescription Increase Physical Activity;Increase Strength and Stamina;Understanding of Exercise Prescription   Comments Reviewed RPE and dyspnea scales, THR and program prescription with pt today.  Pt voiced understanding and was given a copy of goals to take home. Rush Landmark is tolerating exercise well in his first weeks.  he has increase NS to level 4.  Staff will monitor progress. Rush Landmark has always been active at home with a weight routine and walking his dog.  He also stays busy with mowing the yard and doing other yard work.  Reviewed home exercise with pt today.  Pt plans to continue to walk with dog at home for exercise.  Rush Landmark also has a weight routine that he continues to do daily.  Reviewed THR, pulse, RPE, sign and symptoms, pulse oximetery and when to call 911 or MD.  Also discussed weather considerations and indoor options.  Pt  voiced understanding. He really wants to get back to golf again so we talked about the progression from bucket of balls back to the course. Rush Landmark has been doing well in rehab.  He is up to 2.5 mph on the treadmill and using 5 lb hand weights.  We will continue to monitor his progress. Rush Landmark is doing well in rehab.  He has gotten back to his walking routine and has enjoyed the warmer days recently.  He has noted that his strength and stamina are improving.  He is breathing better too.  He is getting a home stationary bike to use today and excited to have an indoor option for winter weather.   Expected Outcomes Short: Use RPE daily to regulate intensity. Long: Follow program prescription in THR. Short:  attend consistently Long:  build overall stamina Short: Continue to add in walking at home on off days Long; Continue to improve stamina. Short: Try a little more incline on treadmill Long: Conitnue to improve stamina. SHort: Continue to walk more on off  days Long: contiue to improve stamina    Row Name 01/02/22 1354 01/16/22 1102 01/24/22 1019 01/30/22 0907 02/14/22 0808     Exercise Goal Re-Evaluation   Exercise Goals Review Increase Physical Activity;Increase Strength and Stamina Increase Physical Activity;Increase Strength and Stamina Increase Physical Activity;Increase Strength and Stamina;Understanding of Exercise Prescription Increase Physical Activity;Increase Strength and Stamina Increase Physical Activity;Increase Strength and Stamina;Understanding of Exercise Prescription   Comments Rush Landmark is progressing well and varies TM between 2.2 and 2.5 mph and 1-2% incline.  We will continue to monitor progress. Rush Landmark continues to do well. He rarely hits his THRZ, however, his RPEs are staying in appropriate ranges. He increased the incline on the TM to 3%. Will continue to monitor. Rush Landmark is doing well in rehab. He rides his recumbent bike for 30-60 min at home three times a week.  He will also lift weights and stretches.  He was also able to play golf on Saturday.  It made him tired but he was able to play a whole round.  He is pleased with his progress thus far.  We expect to see an improvement in his post 6MWT. Rush Landmark attends consistently and works at [employer] 12[employer]14  He varies speed and grade on TM but maintains consistent MET level.  We will continue to monitor. Rush Landmark is ready to graduate!  He improved his post walk by 113 ft.  He is planning to continue walking after graduation   Expected Outcomes Short: continue to increase incline on TM Long: increase average MET level Short: Continue to build up loads on TM progressively Long: Continue to build up strength and stamina Short: Improve post 6MWT Long: Continue to build stamina on golf course Short:improve post walk Long: maintain exercise independently Continue to walk indpendently            Discharge Exercise Prescription (Final Exercise Prescription Changes):  Exercise Prescription Changes - 02/14/22  0800       Response to Exercise   Blood Pressure (Admit) 130/54    Blood Pressure (Exit) 100/60    Heart Rate (Admit) 58 bpm    Heart Rate (Exercise) 72 bpm    Heart Rate (Exit) 70 bpm    Oxygen Saturation (Admit) 97 %    Oxygen Saturation (Exercise) 94 %    Oxygen Saturation (Exit) 96 %    Rating of Perceived Exertion (Exercise) 14    Symptoms none    Duration Continue with 30 min  of aerobic exercise without signs/symptoms of physical distress.    Intensity THRR unchanged      Progression   Progression Continue to progress workloads to maintain intensity without signs/symptoms of physical distress.    Average METs 3.88      Resistance Training   Training Prescription Yes    Weight 5 lb    Reps 10-15      Interval Training   Interval Training No      Treadmill   MPH 2.6    Grade 3    Minutes 15    METs 4.66      Recumbant Elliptical   Level 1    Minutes 15    METs 1.7      REL-XR   Level 9    Minutes 15    METs 4.9      Home Exercise Plan   Plans to continue exercise at Home (comment)   walking, staff videos   Frequency Add 2 additional days to program exercise sessions.    Initial Home Exercises Provided 12/06/21             Nutrition:  Target Goals: Understanding of nutrition guidelines, daily intake of sodium <1566m, cholesterol <2075m calories 30% from fat and 7% or less from saturated fats, daily to have 5 or more servings of fruits and vegetables.  Education: All About Nutrition: -Group instruction provided by verbal, written material, interactive activities, discussions, models, and posters to present general guidelines for heart healthy nutrition including fat, fiber, MyPlate, the role of sodium in heart healthy nutrition, utilization of the nutrition label, and utilization of this knowledge for meal planning. Follow up email sent as well. Written material given at graduation. [facility]  Date 01/19/22  [facility]ruction Review Code 1- Verbalizes Understanding       Biometrics:  Pre Biometrics - 11/14/21 1601       Pre Biometrics   Height '5\' 10"'  (1.778 m)    Weight 168 lb 1.6 oz (76.2 kg)    BMI (Calculated) 24.12    Single Leg Stand 2.2 seconds             Post Biometrics - 01/31/22 1022        Post  Biometrics   Height '5\' 10"'  (1.778 m)    Weight 170 lb 1.6 oz (77.2 kg)    BMI (Calculated) 24.41    Single Leg Stand 4.3 seconds             Nutrition Therapy Plan and Nutrition Goals:  Nutrition Therapy & Goals - 11/28/21 0818       Nutrition Therapy   Diet Heart healthy, low Na    Drug/Food Interactions Statins/Certain Fruits    Protein (specify units) 60g    Fiber 30 grams    Whole Grain Foods 3 servings    Saturated Fats 12 max. grams    Fruits and Vegetables 8 servings/day      Personal Nutrition Goals   Nutrition Goal ST: read lables of ready to eat canned foods for sodium content, make sure having three good sources of protein daily (if none at lunch have a boiled egg with snack), explore different frozen vegeables for variety  LT: limit Na <1.5g/day, eat rainbow of fruits and vegetables each week, meet protein needs.    Comments He prepares many of his own meals, but he is not a good cook; he eats  lots of legumes, grains, and fruit/vegetables. B: coffee with some honey with his medications and yogurt L: cup of soup or sandwich (pimento cheese or pb and J on whole wheat bread) S: crackers or an apple - he also loves blueberries S: lightly salted pecans D: sandwich or protein like pork, beans (3-4x/week), chicken. Chicken pot pie, soup (cambell soup). 2x/week he will have steamed brussels sprouts or broccoli. He likes salads with chicken when he goes out to eat. S: sometimes will have dessert like piece of cheesecake or pie or angel food cake Drinks: water, orange juice. He does not add salt to his foods. Discussed heart  healthy eating.      Intervention Plan   Intervention Prescribe, educate and counsel regarding individualized specific dietary modifications aiming towards targeted core components such as weight, hypertension, lipid management, diabetes, heart failure and other comorbidities.;Nutrition handout(s) given to patient.    Expected Outcomes Short Term Goal: Understand basic principles of dietary content, such as calories, fat, sodium, cholesterol and nutrients.;Short Term Goal: A plan has been developed with personal nutrition goals set during dietitian appointment.;Long Term Goal: Adherence to prescribed nutrition plan.             Nutrition Assessments:  MEDIFICTS Score Key: ?70 Need to make dietary changes  40-70 Heart Healthy Diet ? 40 Therapeutic Level Cholesterol Diet  Flowsheet Row Cardiac Rehab from 02/02/2022 in [facility] Cardiac and Pulmonary Rehab  Picture Your Plate Total Score on Admission 67  Picture Your Plate Total Score on Discharge 71      Picture Your Plate Scores: <92 Unhealthy dietary pattern with much room for improvement. 41-50 Dietary pattern unlikely to meet recommendations for good health and room for improvement. 51-60 More healthful dietary pattern, with some room for improvement.  >60 Healthy dietary pattern, although there may be some specific behaviors that could be improved.    Nutrition Goals Re-Evaluation:  Nutrition Goals Re-Evaluation     [place] Name 12/06/21 1010 12/27/21 0959 01/24/22 1022         Goals   Nutrition Goal ST: read lables of ready to eat canned foods for sodium content, make sure having three good sources of protein daily (if none at lunch have a boiled egg with snack), explore different frozen vegeables for variety  LT: limit Na <1.5g/day, eat rainbow of fruits and vegetables each week, meet protein needs. ST: read lables of ready to eat canned foods for sodium content, make sure having three good sources of protein daily (if none at lunch  have a boiled egg with snack), explore different frozen vegeables for variety  LT: limit Na <1.5g/day, eat rainbow of fruits and vegetables each week, meet protein needs. Short: Get back to his diet and watching sodium Long: conitnue to add in protein to maintain weight and muscle     Comment Bill met with Melissa last week. He had a good appointment with her.  He got some tips to work on and getting variety. Overall, he eats well but is going to try reading labels and get enough protein and variety. Rush Landmark is doing well with his diet.  He does admit to eating a little more and not as good over the holidays.  He is planning to "behave" better now and continue to aim for variety and protein.  He is doing better about reading his food labels. Rush Landmark is doing well with is diet still.  He is eating lots of vegetables.  He will only have red meat  on occasion.  He sticks to chicken and beans for his main protein sources.  He knows he still has room to improve but is doing well with changes he has made.     Expected Outcome ST: read lables of ready to eat canned foods for sodium content, make sure having three good sources of protein daily (if none at lunch have a boiled egg with snack), explore different frozen vegeables for variety  LT: limit Na <1.5g/day, eat rainbow of fruits and vegetables each week, meet protein needs. Short: Get back to his diet and watching sodium Long: conitnue to add in protein to maintain weight and muscle short: Continue to get enough protein Long:Continue to follow heart healthy diet.              Nutrition Goals Discharge (Final Nutrition Goals Re-Evaluation):  Nutrition Goals Re-Evaluation - 01/24/22 1022       Goals   Nutrition Goal Short: Get back to his diet and watching sodium Long: conitnue to add in protein to maintain weight and muscle    Comment Rush Landmark is doing well with is diet still.  He is eating lots of vegetables.  He will only have red meat on occasion.  He sticks to  chicken and beans for his main protein sources.  He knows he still has room to improve but is doing well with changes he has made.    Expected Outcome short: Continue to get enough protein Long:Continue to follow heart healthy diet.             Psychosocial: Target Goals: Acknowledge presence or absence of significant depression and/or stress, maximize coping skills, provide positive support system. Participant is able to verbalize types and ability to use techniques and skills needed for reducing stress and depression.   Education: Stress, Anxiety, and Depression - Group verbal and visual presentation to define topics covered.  Reviews how body is impacted by stress, anxiety, and depression.  Also discusses healthy ways to reduce stress and to treat/manage anxiety and depression.  Written material given at graduation. Flowsheet Row Cardiac Rehab from 02/09/2022 in [facility] Cardiac and Pulmonary Rehab  Date 12/08/21  Educator [facility]  Instruction Review Code 1- Verbalizes Understanding       Education: Sleep Hygiene -Provides group verbal and written instruction about how sleep can affect your health.  Define sleep hygiene, discuss sleep cycles and impact of sleep habits. Review good sleep hygiene tips.    Initial Review & Psychosocial Screening:  Initial Psych Review & Screening - 10/28/21 1448       Initial Review   Current issues with Current Stress Concerns    Source of Stress Concerns Family    Comments son passed 09/06/21; handicapped son still lives at home      Rosa Sanchez? Yes   daughter, neighbors     Barriers   Psychosocial barriers to participate in program The patient should benefit from training in stress management and relaxation.      Screening Interventions   Interventions Encouraged to exercise;Provide feedback about the scores to participant;To provide support and resources with identified psychosocial needs    Expected Outcomes Short Term  goal: Utilizing psychosocial counselor, staff and physician to assist with identification of specific Stressors or current issues interfering with healing process. Setting desired goal for each stressor or current issue identified.;Long Term Goal: Stressors or current issues are controlled or eliminated.;Long Term goal: The participant improves quality of Life and PHQ9  Scores as seen by post scores and/or verbalization of changes;Short Term goal: Identification and review with participant of any Quality of Life or Depression concerns found by scoring the questionnaire.             Quality of Life Scores:   Quality of Life - 02/02/22 0955       Quality of Life Scores   Health/Function Pre 18.6 %    Health/Function Post 21.67 %    Health/Function % Change 16.51 %    Socioeconomic Pre 26.25 %    Socioeconomic Post 25.69 %    Socioeconomic % Change  -2.13 %    Psych/Spiritual Pre 25.71 %    Psych/Spiritual Post 23.79 %    Psych/Spiritual % Change -7.47 %    Family Pre 22.2 %    Family Post 26.4 %    Family % Change 18.92 %    GLOBAL Pre 22.29 %    GLOBAL Post 23.69 %    GLOBAL % Change 6.28 %            Scores of 19 and below usually indicate a poorer quality of life in these areas.  A difference of  2-3 points is a clinically meaningful difference.  A difference of 2-3 points in the total score of the Quality of Life Index has been associated with significant improvement in overall quality of life, self-image, physical symptoms, and general health in studies assessing change in quality of life.  PHQ-9: Recent Review Flowsheet Data     Depression screen [facility] 2/9 02/02/2022 11/14/2021 10/25/2021 01/31/2021 06/29/2020   Decreased Interest 1 0 1 0 0   Down, Depressed, Hopeless '1 1 1 1 ' 0   PHQ - 2 Score '2 1 2 1 ' 0   Altered sleeping 1 0 1 0 -   Tired, decreased energy '2 2 3 1 ' -   Change in appetite '1 1 1 ' 0 -   Feeling bad or failure about yourself  0 0 0 0 -   Trouble concentrating 0 0  0 0 -   Moving slowly or fidgety/restless 0 0 0 0 -   Suicidal thoughts 0 0 0 0 -   PHQ-9 Score '6 4 7 2 ' -   Difficult doing work/chores Somewhat difficult Somewhat difficult Somewhat difficult Not difficult at all -      Interpretation of Total Score  Total Score Depression Severity:  1-4 = Minimal depression, 5-9 = Mild depression, 10-14 = Moderate depression, 15-19 = Moderately severe depression, 20-27 = Severe depression   Psychosocial Evaluation and Intervention:  Psychosocial Evaluation - 10/28/21 1500       Psychosocial Evaluation & Interventions   Interventions Encouraged to exercise with the program and follow exercise prescription;Stress management education    Comments Rush Landmark reports feeling well 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 Prior to his hospitalization, he was walking 8-10,000 steps a day and golfing three times a week. He wants to get back to that and is trying to work his way up to it. When asked about stress, he replied that he guesses he is under some and his doctor thinks he is as well. His 35 year old son passed away 09/10/2021 so he is still dealing with that. His daughter lives local and his handicapped son lives with him. He remembers brining his wife, who has now passed away, to [employer] and thinks this program will be good for him and will give him guidance.    Expected Outcomes Short: attend Cardiac  Rehab for education and exercise. Long: develop and maintain positive self care habits.    Continue Psychosocial Services  Follow up required by staff             Psychosocial Re-Evaluation:  Psychosocial Re-Evaluation     [place] Name 12/06/21 1008 12/27/21 1001 01/24/22 1021         Psychosocial Re-Evaluation   Current issues with Current Stress Concerns Current Stress Concerns Current Stress Concerns     Comments Rush Landmark is doing well in rehab. He is still coping with losing his wife an son so close together.  He is planning to start to meeting with a therapist.  He feels that  his living kids are still a great support system and help him care for his handicapped son living with him too.  He feels good and denies depression but still has those hard to deal with feelings that he wants to meet to talk with therapist about as he copes with his greif.  He notes that he has a good life and a lot to still live for with grandkids too. Rush Landmark is doing well mentally.  He is working hard on his mental health.  He is trying to cope with his wife and son.  He is still waiting for a call back to meet with therapist.  He feels he has a lot to live for.  He will get an appointment set up with a therapist that knows his doctor. Rush Landmark was able to get out on golf course on Saturday for 18 holes.  He continues to have some down days.  He is still waiting to see therapist as her finishes up rehab and a few other appointments before adding to his schedule.  He does his best to stay positive and focus on the good.     Expected Outcomes Short: Start meeting with therapist Long: Continue to focus on the positive. Short: Start meeting with therapist Long: Continue to focus on the positive. Short: Continue to play golf Long: continue to stay positive     Interventions Encouraged to attend Cardiac Rehabilitation for the exercise;Stress management education Encouraged to attend Cardiac Rehabilitation for the exercise;Stress management education Encouraged to attend Cardiac Rehabilitation for the exercise     Continue Psychosocial Services  Follow up required by staff Follow up required by staff --       Initial Review   Comments son passed 09/06/21; handicapped son still lives at home -- --              Psychosocial Discharge (Final Psychosocial Re-Evaluation):  Psychosocial Re-Evaluation - 01/24/22 1021       Psychosocial Re-Evaluation   Current issues with Current Stress Concerns    Comments Rush Landmark was able to get out on golf course on Saturday for 18 holes.  He continues to have some down days.  He  is still waiting to see therapist as her finishes up rehab and a few other appointments before adding to his schedule.  He does his best to stay positive and focus on the good.    Expected Outcomes Short: Continue to play golf Long: continue to stay positive    Interventions Encouraged to attend Cardiac Rehabilitation for the exercise             Vocational Rehabilitation: Provide vocational rehab assistance to qualifying candidates.   Vocational Rehab Evaluation & Intervention:  Vocational Rehab - 10/28/21 1448       Initial Vocational Rehab Evaluation &  Intervention   Assessment shows need for Vocational Rehabilitation No             Education: Education Goals: Education classes will be provided on a variety of topics geared toward better understanding of heart health and risk factor modification. Participant will state understanding/return demonstration of topics presented as noted by education test scores.  Learning Barriers/Preferences:  Learning Barriers/Preferences - 10/28/21 1448       Learning Barriers/Preferences   Learning Barriers None    Learning Preferences None             General Cardiac Education Topics:  AED/CPR: - Group verbal and written instruction with the use of models to demonstrate the basic use of the AED with the basic ABC's of resuscitation.   Anatomy and Cardiac Procedures: - Group verbal and visual presentation and models provide information about basic cardiac anatomy and function. Reviews the testing methods done to diagnose heart disease and the outcomes of the test results. Describes the treatment choices: Medical Management, Angioplasty, or Coronary Bypass Surgery for treating various heart conditions including Myocardial Infarction, Angina, Valve Disease, and Cardiac Arrhythmias.  Written material given at graduation. Flowsheet Row Cardiac Rehab from 02/09/2022 in [facility] Cardiac and Pulmonary Rehab  Date 12/29/21  Educator SB   Instruction Review Code 1- Verbalizes Understanding       Medication Safety: - Group verbal and visual instruction to review commonly prescribed medications for heart and lung disease. Reviews the medication, class of the drug, and side effects. Includes the steps to properly store meds and maintain the prescription regimen.  Written material given at graduation. Flowsheet Row Cardiac Rehab from 02/09/2022 in [facility] Cardiac and Pulmonary Rehab  Date 01/12/22  Educator [facility]  Instruction Review Code 1- Verbalizes Understanding       Intimacy: - Group verbal instruction through game format to discuss how heart and lung disease can affect sexual intimacy. Written material given at graduation.. Flowsheet Row Cardiac Rehab from 02/09/2022 in [facility] Cardiac and Pulmonary Rehab  Date 12/22/21  Educator [facility]  Instruction Review Code 1- Verbalizes Understanding       Know Your Numbers and Heart Failure: - Group verbal and visual instruction to discuss disease risk factors for cardiac and pulmonary disease and treatment options.  Reviews associated critical values for Overweight/Obesity, Hypertension, Cholesterol, and Diabetes.  Discusses basics of heart failure: signs/symptoms and treatments.  Introduces Heart Failure Zone chart for action plan for heart failure.  Written material given at graduation. Flowsheet Row Cardiac Rehab from 02/09/2022 in [facility] Cardiac and Pulmonary Rehab  Date 11/24/21  Educator SB  Instruction Review Code 1- Verbalizes Understanding       Infection Prevention: - Provides verbal and written material to individual with discussion of infection control including proper hand washing and proper equipment cleaning during exercise session. Flowsheet Row Cardiac Rehab from 02/09/2022 in [facility] Cardiac and Pulmonary Rehab  Date 11/14/21  Educator AS  Instruction Review Code 1- Verbalizes Understanding       Falls Prevention: - Provides verbal and written material to  individual with discussion of falls prevention and safety. Flowsheet Row Cardiac Rehab from 02/09/2022 in [facility] Cardiac and Pulmonary Rehab  Date 11/14/21  Educator AS  Instruction Review Code 1- Verbalizes Understanding       Other: -Provides group and verbal instruction on various topics (see comments)   Knowledge Questionnaire Score:  Knowledge Questionnaire Score - 02/02/22 0955       Knowledge Questionnaire Score   Pre Score  24/26    Post Score 25/26             Core Components/Risk Factors/Patient Goals at Admission:  Personal Goals and Risk Factors at Admission - 11/14/21 1602       Core Components/Risk Factors/Patient Goals on Admission   Hypertension Yes    Intervention Provide education on lifestyle modifcations including regular physical activity/exercise, weight management, moderate sodium restriction and increased consumption of fresh fruit, vegetables, and low fat dairy, alcohol moderation, and smoking cessation.;Monitor prescription use compliance.    Expected Outcomes Short Term: Continued assessment and intervention until BP is < 140/55m HG in hypertensive participants. < 130/854mHG in hypertensive participants with diabetes, heart failure or chronic kidney disease.;Long Term: Maintenance of blood pressure at goal levels.    Lipids Yes    Intervention Provide education and support for participant on nutrition & aerobic/resistive exercise along with prescribed medications to achieve LDL <7012mHDL >5m47m  Expected Outcomes Short Term: Participant states understanding of desired cholesterol values and is compliant with medications prescribed. Participant is following exercise prescription and nutrition guidelines.;Long Term: Cholesterol controlled with medications as prescribed, with individualized exercise RX and with personalized nutrition plan. Value goals: LDL < 70mg77mL > 40 mg.             Education:Diabetes - Individual verbal and written  instruction to review signs/symptoms of diabetes, desired ranges of glucose level fasting, after meals and with exercise. Acknowledge that pre and post exercise glucose checks will be done for 3 sessions at entry of program.   Core Components/Risk Factors/Patient Goals Review:   Goals and Risk Factor Review     Row Name 12/06/21 1012 12/27/21 0956 01/24/22 1024         Core Components/Risk Factors/Patient Goals Review   Personal Goals Review Weight Management/Obesity;Hypertension;Lipids Weight Management/Obesity;Hypertension;Lipids Weight Management/Obesity;Hypertension;Lipids;Improve shortness of breath with ADL's     Review Bill Rush Landmarkoing well in rehab.  He lost some weight initially post MI, but is now holding steady around 169-170 lb.  Prior to all this he was 175-176 lb and would like to at least continue to build his strength back up.  His blood pressures are doing well in class.  He does not check them at home as he had never had any issues with it prior to heart event.  He is staying active at home.  He really wants to get back to golf again so we talked about the progression from bucket of balls back to the course. Bill Rush Landmarkoing well in rehab.  He has noted that his breathing is getting better with the PLB and his cardiac cough from meds has improved too. His weight is up 2 lbs since starting.  He is eating a little more and not as healthy with the holidays. He is hoping to hold steady here as he feels healthy here.  His pressures continue to do well and he continues to check it at home. Bill Rush Landmarkoing well. His weight is holding steady.  He would like to keep it where he is.  His breathing is doing well and he use PLB to help.  His pressures are doing well.     Expected Outcomes -- Short: Conitnue to maintain weight Long: Continue to monitor risk factors. Short: Conitnue to maintain weight Long: Continue to monitor risk factors.              Core Components/Risk Factors/Patient Goals at  Discharge (Final Review):  Goals and Risk Factor Review - 01/24/22 1024       Core Components/Risk Factors/Patient Goals Review   Personal Goals Review Weight Management/Obesity;Hypertension;Lipids;Improve shortness of breath with ADL's    Review Rush Landmark is doing well. His weight is holding steady.  He would like to keep it where he is.  His breathing is doing well and he use PLB to help.  His pressures are doing well.    Expected Outcomes Short: Conitnue to maintain weight Long: Continue to monitor risk factors.             ITP Comments:  ITP Comments     Row Name 10/28/21 1444 11/14/21 1605 11/22/21 0957 11/23/21 0618 11/28/21 0854   ITP Comments Initial telephone orientation completed. Documentation can be found in [facility] 10/25. EP orientation scheduled for Monday 11/21 at 3pm. Completed 6MWT and gym orientation. Initial ITP created and sent for review to Dr.Mark Sabra Heck, Medical Director. First full day of exercise!  Patient was oriented to gym and equipment including functions, settings, policies, and procedures.  Patient's individual exercise prescription and treatment plan were reviewed.  All starting workloads were established based on the results of the 6 minute walk test done at initial orientation visit.  The plan for exercise progression was also introduced and progression will be customized based on patient's performance and goals. 30 Day review completed. Medical Director ITP review done, changes made as directed, and signed approval by Medical Director.   New to program Completed initial RD consultation    Row Name 12/21/21 [phone] 01/18/22 0806 02/15/22 0729       ITP Comments 30 Day review completed. Medical Director ITP review done, changes made as directed, and signed approval by Medical Director. 30 Day review completed. Medical Director ITP review done, changes made as directed, and signed approval by Medical Director. 30 Day review completed. Medical Director ITP review done,  changes made as directed, and signed approval by Medical Director.              Comments:

## 2022-02-16 ENCOUNTER — Other Ambulatory Visit: Payer: Self-pay

## 2022-02-16 DIAGNOSIS — Z5189 Encounter for other specified aftercare: Secondary | ICD-10-CM | POA: Diagnosis not present

## 2022-02-16 DIAGNOSIS — Z955 Presence of coronary angioplasty implant and graft: Secondary | ICD-10-CM | POA: Diagnosis not present

## 2022-02-16 DIAGNOSIS — I214 Non-ST elevation (NSTEMI) myocardial infarction: Secondary | ICD-10-CM

## 2022-02-16 DIAGNOSIS — I252 Old myocardial infarction: Secondary | ICD-10-CM | POA: Diagnosis not present

## 2022-02-16 NOTE — Progress Notes (Signed)
Daily Session Note  Patient Details  Name: ELIAZ FOUT MRN: [id number] Date of Birth: 1941/05/14 Referring Provider:   Flowsheet Row Cardiac Rehab from 11/14/2021 in [facility]  Referring Provider Paraschos       Encounter Date: 02/16/2022  Check In:  Session Check In - 02/16/22 0957       Check-In   Supervising physician immediately available to respond to emergencies See telemetry face sheet for immediately available ER MD    Location [facility] & Pul[facility]    Staff Present Birdie Sons, MPA, RN;Melissa Valentine, RDN, Wilhelmina Mcardle, BS, ACSM CEP, Exercise Physiologist    Virtual Visit No    Medication changes reported     No    Fall or balance concerns reported    No    Tobacco Cessation No Change    Warm-up and Cool-down Performed on first and last piece of equipment    Resistance Training Performed Yes    VAD Patient? No    PAD/SET Patient? No      Pain Assessment   Currently in Pain? No/denies                Social History   Tobacco Use  Smoking Status Former   Packs/day: 1.50   Types: Cigarettes   Quit date: 12/24/1964   Years since quitting: 57.1  Smokeless Tobacco Never    Goals Met:  Independence with exercise equipment Exercise tolerated well No report of concerns or symptoms today Strength training completed today  Goals Unmet:  Not Applicable  Comments: Pt able to follow exercise prescription today without complaint.  Will continue to monitor for progression.    Dr. Emily Filbert is Medical Director for [facility].  Dr. Ottie Glazier is Medical Director for [facility].

## 2022-02-21 ENCOUNTER — Other Ambulatory Visit: Payer: Self-pay

## 2022-02-21 DIAGNOSIS — Z955 Presence of coronary angioplasty implant and graft: Secondary | ICD-10-CM

## 2022-02-21 DIAGNOSIS — I252 Old myocardial infarction: Secondary | ICD-10-CM | POA: Diagnosis not present

## 2022-02-21 DIAGNOSIS — I214 Non-ST elevation (NSTEMI) myocardial infarction: Secondary | ICD-10-CM

## 2022-02-21 DIAGNOSIS — Z5189 Encounter for other specified aftercare: Secondary | ICD-10-CM | POA: Diagnosis not present

## 2022-02-21 NOTE — Progress Notes (Signed)
Daily Session Note ° °Patient Details  °Name: Darien L Plazola °MRN: [id number] °Date of Birth: 01/13/1941 °Referring Provider:   °Flowsheet Row Cardiac Rehab from 11/14/2021 in [facility] Card[facility] and [facility]  °Referring Provider Paraschos  ° °  ° ° °Encounter Date: 02/21/2022 ° °Check In: ° Session Check In - 02/21/22 0936   ° °  ° Check-In  ° Supervising physician immediately available to respond to emergencies See telemetry face sheet for immediately available ER MD   ° Location [facility]   ° Staff Present Kelly Bollinger, MPA, RN;Melissa Caiola, RDN, LDN;Jessica Hawkins, MA, RCEP, CCRP, CCET;Amanda Sommer, BA, ACSM CEP, Exercise Physiologist   ° Virtual Visit No   ° Medication changes reported     No   ° Fall or balance concerns reported    No   ° Tobacco Cessation No Change   ° Warm-up and Cool-down Performed on first and last piece of equipment   ° Resistance Training Performed Yes   ° VAD Patient? No   ° PAD/SET Patient? No   °  ° Pain Assessment  ° Currently in Pain? No/denies   ° °  °  ° °  ° ° ° ° ° °Social History  ° °Tobacco Use  °Smoking Status Former  ° Packs/day: 1.50  ° Types: Cigarettes  ° Quit date: 12/24/1964  ° Years since quitting: 57.2  °Smokeless Tobacco Never  ° ° °Goals Met:  °Independence with exercise equipment °Exercise tolerated well °No report of concerns or symptoms today °Strength training completed today ° °Goals Unmet:  °Not Applicable ° °Comments: Pt able to follow exercise prescription today without complaint.  Will continue to monitor for progression. ° ° ° °Dr. Mark Miller is Medical Director for [facility].  °Dr. Fuad Aleskerov is Medical Director for [facility]. °

## 2022-02-23 ENCOUNTER — Encounter: Payer: Medicare Other | Attending: Cardiology

## 2022-02-23 ENCOUNTER — Other Ambulatory Visit: Payer: Self-pay

## 2022-02-23 DIAGNOSIS — Z48812 Encounter for surgical aftercare following surgery on the circulatory system: Secondary | ICD-10-CM | POA: Diagnosis not present

## 2022-02-23 DIAGNOSIS — I252 Old myocardial infarction: Secondary | ICD-10-CM | POA: Insufficient documentation

## 2022-02-23 DIAGNOSIS — Z955 Presence of coronary angioplasty implant and graft: Secondary | ICD-10-CM

## 2022-02-23 DIAGNOSIS — I214 Non-ST elevation (NSTEMI) myocardial infarction: Secondary | ICD-10-CM

## 2022-02-23 NOTE — Progress Notes (Signed)
Cardiac Individual Treatment Plan  Patient Details  Name: Gary Mueller MRN: [id number] Date of Birth: 1941-05-08 Referring Provider:   Flowsheet Row Cardiac Rehab from 11/14/2021 in [facility] Cardiac and Pulmonary Rehab  Referring Provider Paraschos       Initial Encounter Date:  Flowsheet Row Cardiac Rehab from 11/14/2021 in [facility] Cardiac and Pulmonary Rehab  Date 11/14/21       Visit Diagnosis: NSTEMI (non-ST elevated myocardial infarction) [facility])  Status post coronary artery stent placement  Patient's Home Medications on Admission:  Current Outpatient Medications:    losartan (COZAAR) 50 MG tablet, Take 1 tablet (50 mg total) by mouth daily., Disp: 30 tablet, Rfl: 1   acetaminophen (TYLENOL) 500 MG tablet, Take 1,000 mg by mouth daily as needed for moderate pain., Disp: , Rfl:    amLODipine (NORVASC) 5 MG tablet, TAKE ONE TABLET EVERY DAY, Disp: 90 tablet, Rfl: 0   ascorbic acid (VITAMIN C) 1000 MG tablet, Take 1 tablet by mouth daily., Disp: , Rfl:    aspirin EC 81 MG EC tablet, Take 1 tablet (81 mg total) by mouth daily. Swallow whole., Disp: 30 tablet, Rfl: 0   folic acid (FOLVITE) 1 MG tablet, Take 1 mg by mouth daily., Disp: , Rfl:    levothyroxine (SYNTHROID) 100 MCG tablet, TAKE 1 TABLET EVERY DAY ON EMPTY STOMACHWITH A GLASS OF WATER AT LEAST 30-60 MINBEFORE BREAKFAST, Disp: 90 tablet, Rfl: 0   methotrexate 2.5 MG tablet, Take 15 mg by mouth every Wednesday., Disp: , Rfl:    metoprolol succinate (TOPROL-XL) 25 MG 24 hr tablet, TAKE 1/2 TABLET BY MOUTH EVERY EVENING, Disp: 45 tablet, Rfl: 3   Multiple Vitamin (MULTIVITAMIN WITH MINERALS) TABS tablet, Take 1 tablet by mouth every evening. , Disp: , Rfl:    nitroGLYCERIN (NITROSTAT) 0.4 MG SL tablet, Place 1 tablet (0.4 mg total) under the tongue every 5 (five) minutes as needed for chest pain., Disp: 30 tablet, Rfl: 0   pantoprazole (PROTONIX) 40 MG tablet, TAKE ONE TABLET (40 MG) BY MOUTH EVERY DAY, Disp: 90 tablet, Rfl: 1    rosuvastatin (CRESTOR) 40 MG tablet, TAKE ONE TABLET BY MOUTH EVERY DAY, Disp: 90 tablet, Rfl: 3   ticagrelor (BRILINTA) 90 MG TABS tablet, Take 1 tablet (90 mg total) by mouth 2 (two) times daily., Disp: 60 tablet, Rfl: 0   vitamin E 180 MG (400 UNITS) capsule, Take 1 capsule by mouth daily., Disp: , Rfl:   Past Medical History: Past Medical History:  Diagnosis Date   Allergy    Anxiety    Arthritis    Cancer ([place]) 10/20/2016   Basal Cell Skin Cancer; lip, neck   Cystoid macular edema of left eye 07/07/2019   Depression    Dysrhythmia    A FIB   GERD (gastroesophageal reflux disease)    HOH (hard of hearing)    Bilateral   Hyperlipidemia    Hypertension    Patient denies   Hypothyroidism    IBS (irritable bowel syndrome)    Inguinal hernia    bilateral   Pneumonia    Thyroid disease    Tinnitus of both ears     Tobacco Use: Social History   Tobacco Use  Smoking Status Former   Packs/day: 1.50   Types: Cigarettes   Quit date: 12/24/1964   Years since quitting: 57.2  Smokeless Tobacco Never    Labs: Recent Review Scientist, physiological     Labs for ITP Cardiac and Pulmonary Rehab Latest Ref  Rng & Units 01/10/2018 06/16/2019 01/28/2020 02/01/2021 10/26/2021   Cholestrol 100 - 199 mg/dL 212(H) 150 193 201(H) 154   LDLCALC 0 - 99 mg/dL 121(H) 81 134(H) 139(H) 99   HDL >39 mg/dL 33(L) 49 40 40 38(L)   Trlycerides 0 - 149 mg/dL 290(H) 101 103 119 89        Exercise Target Goals: Exercise Program Goal: Individual exercise prescription set using results from initial 6 min walk test and THRR while considering  patients activity barriers and safety.   Exercise Prescription Goal: Initial exercise prescription builds to 30-45 minutes a day of aerobic activity, 2-3 days per week.  Home exercise guidelines will be given to patient during program as part of exercise prescription that the participant will acknowledge.   Education: Aerobic Exercise: - Group verbal and visual  presentation on the components of exercise prescription. Introduces F.I.T.T principle from ACSM for exercise prescriptions.  Reviews F.I.T.T. principles of aerobic exercise including progression. Written material given at graduation. Flowsheet Row Cardiac Rehab from 02/23/2022 in [facility] Cardiac and Pulmonary Rehab  Date 12/22/21  Educator [facility]  Instruction Review Code 1- Verbalizes Understanding       Education: Resistance Exercise: - Group verbal and visual presentation on the components of exercise prescription. Introduces F.I.T.T principle from ACSM for exercise prescriptions  Reviews F.I.T.T. principles of resistance exercise including progression. Written material given at graduation. Flowsheet Row Cardiac Rehab from 02/23/2022 in [facility] Cardiac and Pulmonary Rehab  Date 12/29/21  Educator [facility]  Instruction Review Code 1- Verbalizes Understanding        Education: Exercise & Equipment Safety: - Individual verbal instruction and demonstration of equipment use and safety with use of the equipment. Flowsheet Row Cardiac Rehab from 02/23/2022 in [place] Cardiac and Pulmonary Rehab  Date 11/14/21  Educator [facility]  Instruction Review Code 1- Verbalizes Understanding       Education: Exercise Physiology & General Exercise Guidelines: - Group verbal and written instruction with models to review the exercise physiology of the cardiovascular system and associated critical values. Provides general exercise guidelines with specific guidelines to those with heart or lung disease.  Flowsheet Row Cardiac Rehab from 02/23/2022 in [facility] Cardiac and Pulmonary Rehab  Education need identified 11/14/21  Date 12/15/21  Educator [facility]  Instruction Review Code 1- [employer] Understanding       Education: Flexibility, Balance, Mind/Body Relaxation: - Group verbal and visual presentation with interactive activity on the components of exercise prescription. Introduces F.I.T.T principle from ACSM for exercise prescriptions.  Reviews F.I.T.T. principles of flexibility and balance exercise training including progression. Also discusses the mind body connection.  Reviews various relaxation techniques to help reduce and manage stress (i.e. Deep breathing, progressive muscle relaxation, and visualization). Balance handout provided to take home. Written material given at graduation.   Activity Barriers & Risk Stratification:  Activity Barriers & Cardiac Risk Stratification - 10/28/21 1444       Activity Barriers & Cardiac Risk Stratification   Activity Barriers Left Knee Replacement;Right Knee Replacement    Cardiac Risk Stratification Moderate             6 Minute Walk:  6 Minute Walk     Row Name 11/14/21 1549 01/31/22 1021       6 Minute Walk   Phase Initial Discharge    Distance 1205 feet 1318 feet    Distance % Change -- 9.4 %    Distance Feet Change -- 113 ft    Walk Time 6 minutes 6  minutes    # of Rest Breaks 0 0    MPH 2.28 2.5    METS 2.1 2.19    RPE 11 11    Perceived Dyspnea  1 --    VO2 Peak 7.4 7.67    Symptoms No No    Resting HR 60 bpm 57 bpm    Resting BP 104/60 120/66    Resting Oxygen Saturation  98 % 93 %    Exercise Oxygen Saturation  during 6 min walk 97 % 93 %    Max Ex. HR 76 bpm 68 bpm    Max Ex. BP 132/60 128/64    2 Minute Post BP 114/58 --             Oxygen Initial Assessment:   Oxygen Re-Evaluation:   Oxygen Discharge (Final Oxygen Re-Evaluation):   Initial Exercise Prescription:  Initial Exercise Prescription - 11/14/21 1500       Date of Initial Exercise RX and Referring Provider   Date 11/14/21    Referring Provider Paraschos      Oxygen   Maintain Oxygen Saturation 88% or higher      Treadmill   MPH 2    Grade 0.5    Minutes 15    METs 2.6      NuStep   Level 2    SPM 80    Minutes 15    METs 2.1      Recumbant Elliptical   Level 1    RPM 50    Minutes 15    METs 2.1      Biostep-RELP   Level 2    SPM 50    Minutes 15     METs 2      Prescription Details   Frequency (times per week) 3    Duration Progress to 30 minutes of continuous aerobic without signs/symptoms of physical distress      Intensity   THRR 40-80% of Max Heartrate 91-122    Ratings of Perceived Exertion 11-13    Perceived Dyspnea 0-4      Resistance Training   Training Prescription Yes    Weight 3 lb    Reps 10-15             Perform Capillary Blood Glucose checks as needed.  Exercise Prescription Changes:   Exercise Prescription Changes     Row Name 11/14/21 1600 12/05/21 1400 12/21/21 0800 01/02/22 1300 01/16/22 1000     Response to Exercise   Blood Pressure (Admit) 104/60 118/64 112/60 132/58 122/64   Blood Pressure (Exercise) 132/60 128/60 130/70 -- --   Blood Pressure (Exit) 114/58 120/60 118/62 136/62 120/62   Heart Rate (Admit) 60 bpm 57 bpm 59 bpm 67 bpm 60 bpm   Heart Rate (Exercise) 76 bpm 78 bpm 105 bpm 90 bpm 81 bpm   Heart Rate (Exit) 61 bpm 70 bpm 86 bpm 69 bpm 78 bpm   Oxygen Saturation (Admit) 98 % -- -- -- --   Oxygen Saturation (Exercise) 97 % -- -- -- --   Rating of Perceived Exertion (Exercise) '11 12 12 13 13   ' Perceived Dyspnea (Exercise) 1 -- -- -- --   Symptoms none none none none none   Comments -- fifth day exercise -- -- --   Duration -- Continue with 30 min of aerobic exercise without signs/symptoms of physical distress. Continue with 30 min of aerobic exercise without signs/symptoms of physical distress. Continue with 30 min of aerobic exercise  without signs/symptoms of physical distress. Continue with 30 min of aerobic exercise without signs/symptoms of physical distress.   Intensity -- THRR unchanged THRR unchanged THRR unchanged THRR unchanged     Progression   Progression -- Continue to progress workloads to maintain intensity without signs/symptoms of physical distress. Continue to progress workloads to maintain intensity without signs/symptoms of physical distress. Continue to progress  workloads to maintain intensity without signs/symptoms of physical distress. Continue to progress workloads to maintain intensity without signs/symptoms of physical distress.   Average METs -- 2.74 3.02 2.9 3.4     Resistance Training   Training Prescription -- Yes Yes Yes Yes   Weight -- 3 lb 5 lb 5 lb 5 lb   Reps -- 10-15 10-15 10-15 10-15     Interval Training   Interval Training -- -- No -- No     Treadmill   MPH -- -- 2.5 -- 2.3   Grade -- -- 1 -- 3   Minutes -- -- 15 -- 15   METs -- -- 3.26 -- 3.71     NuStep   Level -- 4 5 -- 5   Minutes -- 15 15 -- 15   METs -- 3.3 3.2 -- 3.4     Recumbant Elliptical   Level -- -- 3.5 -- 4   Minutes -- -- 15 -- 15   METs -- -- 2.6 -- 1.7     REL-XR   Level -- -- -- -- 1   Minutes -- -- -- -- 15   METs -- -- -- -- 2.4     Biostep-RELP   Level -- 2 2 -- --   Minutes -- 15 15 -- --   METs -- 2 -- -- --     Home Exercise Plan   Plans to continue exercise at -- -- Home (comment)  walking, staff videos -- Home (comment)  walking, staff videos   Frequency -- -- Add 2 additional days to program exercise sessions. -- Add 2 additional days to program exercise sessions.   Initial Home Exercises Provided -- -- 12/06/21 -- 12/06/21    Row Name 01/30/22 0900 02/14/22 0800           Response to Exercise   Blood Pressure (Admit) 124/58 130/54      Blood Pressure (Exit) 100/54 100/60      Heart Rate (Admit) 55 bpm 58 bpm      Heart Rate (Exercise) 84 bpm 72 bpm      Heart Rate (Exit) 73 bpm 70 bpm      Oxygen Saturation (Admit) -- 97 %      Oxygen Saturation (Exercise) -- 94 %      Oxygen Saturation (Exit) -- 96 %      Rating of Perceived Exertion (Exercise) 13 14      Symptoms none none      Duration Continue with 30 min of aerobic exercise without signs/symptoms of physical distress. Continue with 30 min of aerobic exercise without signs/symptoms of physical distress.      Intensity THRR unchanged THRR unchanged         Progression   Progression Continue to progress workloads to maintain intensity without signs/symptoms of physical distress. Continue to progress workloads to maintain intensity without signs/symptoms of physical distress.      Average METs 3.35 3.88        Resistance Training   Training Prescription Yes Yes      Weight 5 lb 5 lb  Reps 10-15 10-15        Interval Training   Interval Training No No        Treadmill   MPH 1.7 2.6      Grade 6 3      Minutes 15 15      METs 3.71 4.66        Recumbant Elliptical   Level -- 1      Minutes -- 15      METs -- 1.7        REL-XR   Level -- 9      Minutes -- 15      METs -- 4.9        Biostep-RELP   Level 2 --      Minutes 15 --      METs 3 --        Home Exercise Plan   Plans to continue exercise at Home (comment)  walking, staff videos Home (comment)  walking, staff videos      Frequency Add 2 additional days to program exercise sessions. Add 2 additional days to program exercise sessions.      Initial Home Exercises Provided 12/06/21 12/06/21               Exercise Comments:   Exercise Comments     Row Name 11/22/21 0957           Exercise Comments First full day of exercise!  Patient was oriented to gym and equipment including functions, settings, policies, and procedures.  Patient's individual exercise prescription and treatment plan were reviewed.  All starting workloads were established based on the results of the 6 minute walk test done at initial orientation visit.  The plan for exercise progression was also introduced and progression will be customized based on patient's performance and goals.                Exercise Goals and Review:   Exercise Goals     Row Name 11/14/21 1600             Exercise Goals   Increase Physical Activity Yes       Intervention Provide advice, education, support and counseling about physical activity/exercise needs.;Develop an individualized exercise prescription  for aerobic and resistive training based on initial evaluation findings, risk stratification, comorbidities and participant's personal goals.       Expected Outcomes Short Term: Attend rehab on a regular basis to increase amount of physical activity.;Long Term: Add in home exercise to make exercise part of routine and to increase amount of physical activity.;Long Term: Exercising regularly at least 3-5 days a week.       Increase Strength and Stamina Yes       Intervention Provide advice, education, support and counseling about physical activity/exercise needs.;Develop an individualized exercise prescription for aerobic and resistive training based on initial evaluation findings, risk stratification, comorbidities and participant's personal goals.       Expected Outcomes Short Term: Increase workloads from initial exercise prescription for resistance, speed, and METs.;Short Term: Perform resistance training exercises routinely during rehab and add in resistance training at home;Long Term: Improve cardiorespiratory fitness, muscular endurance and strength as measured by increased METs and functional capacity (6MWT)       Able to understand and use rate of perceived exertion (RPE) scale Yes       Intervention Provide education and explanation on how to use RPE scale       Expected Outcomes Short  Term: Able to use RPE daily in rehab to express subjective intensity level;Long Term:  Able to use RPE to guide intensity level when exercising independently       Able to understand and use Dyspnea scale Yes       Intervention Provide education and explanation on how to use Dyspnea scale       Expected Outcomes Short Term: Able to use Dyspnea scale daily in rehab to express subjective sense of shortness of breath during exertion;Long Term: Able to use Dyspnea scale to guide intensity level when exercising independently       Knowledge and understanding of Target Heart Rate Range (THRR) Yes       Intervention  Provide education and explanation of THRR including how the numbers were predicted and where they are located for reference       Expected Outcomes Short Term: Able to state/look up THRR;Short Term: Able to use daily as guideline for intensity in rehab;Long Term: Able to use THRR to govern intensity when exercising independently       Able to check pulse independently Yes       Intervention Provide education and demonstration on how to check pulse in carotid and radial arteries.;Review the importance of being able to check your own pulse for safety during independent exercise       Expected Outcomes Short Term: Able to explain why pulse checking is important during independent exercise;Long Term: Able to check pulse independently and accurately       Understanding of Exercise Prescription Yes       Intervention Provide education, explanation, and written materials on patient's individual exercise prescription       Expected Outcomes Short Term: Able to explain program exercise prescription;Long Term: Able to explain home exercise prescription to exercise independently                Exercise Goals Re-Evaluation :  Exercise Goals Re-Evaluation     Row Name 11/22/21 0957 12/05/21 1401 12/06/21 1007 12/21/21 0810 12/27/21 0955     Exercise Goal Re-Evaluation   Exercise Goals Review Increase Physical Activity;Able to understand and use rate of perceived exertion (RPE) scale;Knowledge and understanding of Target Heart Rate Range (THRR);Understanding of Exercise Prescription;Increase Strength and Stamina;Able to understand and use Dyspnea scale;Able to check pulse independently Increase Physical Activity;Increase Strength and Stamina Increase Physical Activity;Increase Strength and Stamina;Able to understand and use rate of perceived exertion (RPE) scale;Able to understand and use Dyspnea scale;Knowledge and understanding of Target Heart Rate Range (THRR);Able to check pulse  independently;Understanding of Exercise Prescription Increase Physical Activity;Increase Strength and Stamina;Understanding of Exercise Prescription Increase Physical Activity;Increase Strength and Stamina;Understanding of Exercise Prescription   Comments Reviewed RPE and dyspnea scales, THR and program prescription with pt today.  Pt voiced understanding and was given a copy of goals to take home. Gary Mueller is tolerating exercise well in his first weeks.  he has increase NS to level 4.  Staff will monitor progress. Gary Mueller has always been active at home with a weight routine and walking his dog.  He also stays busy with mowing the yard and doing other yard work.  Reviewed home exercise with pt today.  Pt plans to continue to walk with dog at home for exercise.  Gary Mueller also has a weight routine that he continues to do daily.  Reviewed THR, pulse, RPE, sign and symptoms, pulse oximetery and when to call 911 or MD.  Also discussed weather considerations and indoor options.  Pt  voiced understanding. He really wants to get back to golf again so we talked about the progression from bucket of balls back to the course. Gary Mueller has been doing well in rehab.  He is up to 2.5 mph on the treadmill and using 5 lb hand weights.  We will continue to monitor his progress. Gary Mueller is doing well in rehab.  He has gotten back to his walking routine and has enjoyed the warmer days recently.  He has noted that his strength and stamina are improving.  He is breathing better too.  He is getting a home stationary bike to use today and excited to have an indoor option for winter weather.   Expected Outcomes Short: Use RPE daily to regulate intensity. Long: Follow program prescription in THR. Short:  attend consistently Long:  build overall stamina Short: Continue to add in walking at home on off days Long; Continue to improve stamina. Short: Try a little more incline on treadmill Long: Conitnue to improve stamina. SHort: Continue to walk more on off  days Long: contiue to improve stamina    Row Name 01/02/22 1354 01/16/22 1102 01/24/22 1019 01/30/22 0907 02/14/22 0808     Exercise Goal Re-Evaluation   Exercise Goals Review Increase Physical Activity;Increase Strength and Stamina Increase Physical Activity;Increase Strength and Stamina Increase Physical Activity;Increase Strength and Stamina;Understanding of Exercise Prescription Increase Physical Activity;Increase Strength and Stamina Increase Physical Activity;Increase Strength and Stamina;Understanding of Exercise Prescription   Comments Gary Mueller is progressing well and varies TM between 2.2 and 2.5 mph and 1-2% incline.  We will continue to monitor progress. Gary Mueller continues to do well. He rarely hits his THRZ, however, his RPEs are staying in appropriate ranges. He increased the incline on the TM to 3%. Will continue to monitor. Gary Mueller is doing well in rehab. He rides his recumbent bike for 30-60 min at home three times a week.  He will also lift weights and stretches.  He was also able to play golf on Saturday.  It made him tired but he was able to play a whole round.  He is pleased with his progress thus far.  We expect to see an improvement in his post 6MWT. Gary Mueller attends consistently and works at [employer] 12[employer]14  He varies speed and grade on TM but maintains consistent MET level.  We will continue to monitor. Gary Mueller is ready to graduate!  He improved his post walk by 113 ft.  He is planning to continue walking after graduation   Expected Outcomes Short: continue to increase incline on TM Long: increase average MET level Short: Continue to build up loads on TM progressively Long: Continue to build up strength and stamina Short: Improve post 6MWT Long: Continue to build stamina on golf course Short:improve post walk Long: maintain exercise independently Continue to walk indpendently            Discharge Exercise Prescription (Final Exercise Prescription Changes):  Exercise Prescription Changes - 02/14/22  0800       Response to Exercise   Blood Pressure (Admit) 130/54    Blood Pressure (Exit) 100/60    Heart Rate (Admit) 58 bpm    Heart Rate (Exercise) 72 bpm    Heart Rate (Exit) 70 bpm    Oxygen Saturation (Admit) 97 %    Oxygen Saturation (Exercise) 94 %    Oxygen Saturation (Exit) 96 %    Rating of Perceived Exertion (Exercise) 14    Symptoms none    Duration Continue with 30 min  of aerobic exercise without signs/symptoms of physical distress.    Intensity THRR unchanged      Progression   Progression Continue to progress workloads to maintain intensity without signs/symptoms of physical distress.    Average METs 3.88      Resistance Training   Training Prescription Yes    Weight 5 lb    Reps 10-15      Interval Training   Interval Training No      Treadmill   MPH 2.6    Grade 3    Minutes 15    METs 4.66      Recumbant Elliptical   Level 1    Minutes 15    METs 1.7      REL-XR   Level 9    Minutes 15    METs 4.9      Home Exercise Plan   Plans to continue exercise at Home (comment)   walking, staff videos   Frequency Add 2 additional days to program exercise sessions.    Initial Home Exercises Provided 12/06/21             Nutrition:  Target Goals: Understanding of nutrition guidelines, daily intake of sodium <1536m, cholesterol <2032m calories 30% from fat and 7% or less from saturated fats, daily to have 5 or more servings of fruits and vegetables.  Education: All About Nutrition: -Group instruction provided by verbal, written material, interactive activities, discussions, models, and posters to present general guidelines for heart healthy nutrition including fat, fiber, MyPlate, the role of sodium in heart healthy nutrition, utilization of the nutrition label, and utilization of this knowledge for meal planning. Follow up email sent as well. Written material given at graduation. [facility]  Date 01/19/22  [facility] Code 1- Verbalizes Understanding       Biometrics:  Pre Biometrics - 11/14/21 1601       Pre Biometrics   Height '5\' 10"'  (1.778 m)    Weight 168 lb 1.6 oz (76.2 kg)    BMI (Calculated) 24.12    Single Leg Stand 2.2 seconds             Post Biometrics - 01/31/22 1022        Post  Biometrics   Height '5\' 10"'  (1.778 m)    Weight 170 lb 1.6 oz (77.2 kg)    BMI (Calculated) 24.41    Single Leg Stand 4.3 seconds             Nutrition Therapy Plan and Nutrition Goals:  Nutrition Therapy & Goals - 11/28/21 0818       Nutrition Therapy   Diet Heart healthy, low Na    Drug/Food Interactions Statins/Certain Fruits    Protein (specify units) 60g    Fiber 30 grams    Whole Grain Foods 3 servings    Saturated Fats 12 max. grams    Fruits and Vegetables 8 servings/day      Personal Nutrition Goals   Nutrition Goal ST: read lables of ready to eat canned foods for sodium content, make sure having three good sources of protein daily (if none at lunch have a boiled egg with snack), explore different frozen vegeables for variety  LT: limit Na <1.5g/day, eat rainbow of fruits and vegetables each week, meet protein needs.    Comments He prepares many of his own meals, but he is not a good cook; he eats  lots of legumes, grains, and fruit/vegetables. B: coffee with some honey with his medications and yogurt L: cup of soup or sandwich (pimento cheese or pb and J on whole wheat bread) S: crackers or an apple - he also loves blueberries S: lightly salted pecans D: sandwich or protein like pork, beans (3-4x/week), chicken. Chicken pot pie, soup (cambell soup). 2x/week he will have steamed brussels sprouts or broccoli. He likes salads with chicken when he goes out to eat. S: sometimes will have dessert like piece of cheesecake or pie or angel food cake Drinks: water, orange juice. He does not add salt to his foods. Discussed heart  healthy eating.      Intervention Plan   Intervention Prescribe, educate and counsel regarding individualized specific dietary modifications aiming towards targeted core components such as weight, hypertension, lipid management, diabetes, heart failure and other comorbidities.;Nutrition handout(s) given to patient.    Expected Outcomes Short Term Goal: Understand basic principles of dietary content, such as calories, fat, sodium, cholesterol and nutrients.;Short Term Goal: A plan has been developed with personal nutrition goals set during dietitian appointment.;Long Term Goal: Adherence to prescribed nutrition plan.             Nutrition Assessments:  MEDIFICTS Score Key: ?70 Need to make dietary changes  40-70 Heart Healthy Diet ? 40 Therapeutic Level Cholesterol Diet  Flowsheet Row Cardiac Rehab from 02/02/2022 in [facility] Cardiac and Pulmonary Rehab  Picture Your Plate Total Score on Admission 67  Picture Your Plate Total Score on Discharge 71      Picture Your Plate Scores: <81 Unhealthy dietary pattern with much room for improvement. 41-50 Dietary pattern unlikely to meet recommendations for good health and room for improvement. 51-60 More healthful dietary pattern, with some room for improvement.  >60 Healthy dietary pattern, although there may be some specific behaviors that could be improved.    Nutrition Goals Re-Evaluation:  Nutrition Goals Re-Evaluation     [place] Name 12/06/21 1010 12/27/21 0959 01/24/22 1022         Goals   Nutrition Goal ST: read lables of ready to eat canned foods for sodium content, make sure having three good sources of protein daily (if none at lunch have a boiled egg with snack), explore different frozen vegeables for variety  LT: limit Na <1.5g/day, eat rainbow of fruits and vegetables each week, meet protein needs. ST: read lables of ready to eat canned foods for sodium content, make sure having three good sources of protein daily (if none at lunch  have a boiled egg with snack), explore different frozen vegeables for variety  LT: limit Na <1.5g/day, eat rainbow of fruits and vegetables each week, meet protein needs. Short: Get back to his diet and watching sodium Long: conitnue to add in protein to maintain weight and muscle     Comment Gary Mueller met with Melissa last week. He had a good appointment with her.  He got some tips to work on and getting variety. Overall, he eats well but is going to try reading labels and get enough protein and variety. Gary Mueller is doing well with his diet.  He does admit to eating a little more and not as good over the holidays.  He is planning to "behave" better now and continue to aim for variety and protein.  He is doing better about reading his food labels. Gary Mueller is doing well with is diet still.  He is eating lots of vegetables.  He will only have red meat  on occasion.  He sticks to chicken and beans for his main protein sources.  He knows he still has room to improve but is doing well with changes he has made.     Expected Outcome ST: read lables of ready to eat canned foods for sodium content, make sure having three good sources of protein daily (if none at lunch have a boiled egg with snack), explore different frozen vegeables for variety  LT: limit Na <1.5g/day, eat rainbow of fruits and vegetables each week, meet protein needs. Short: Get back to his diet and watching sodium Long: conitnue to add in protein to maintain weight and muscle short: Continue to get enough protein Long:Continue to follow heart healthy diet.              Nutrition Goals Discharge (Final Nutrition Goals Re-Evaluation):  Nutrition Goals Re-Evaluation - 01/24/22 1022       Goals   Nutrition Goal Short: Get back to his diet and watching sodium Long: conitnue to add in protein to maintain weight and muscle    Comment Gary Mueller is doing well with is diet still.  He is eating lots of vegetables.  He will only have red meat on occasion.  He sticks to  chicken and beans for his main protein sources.  He knows he still has room to improve but is doing well with changes he has made.    Expected Outcome short: Continue to get enough protein Long:Continue to follow heart healthy diet.             Psychosocial: Target Goals: Acknowledge presence or absence of significant depression and/or stress, maximize coping skills, provide positive support system. Participant is able to verbalize types and ability to use techniques and skills needed for reducing stress and depression.   Education: Stress, Anxiety, and Depression - Group verbal and visual presentation to define topics covered.  Reviews how body is impacted by stress, anxiety, and depression.  Also discusses healthy ways to reduce stress and to treat/manage anxiety and depression.  Written material given at graduation. Flowsheet Row Cardiac Rehab from 02/23/2022 in [facility] Cardiac and Pulmonary Rehab  Date 12/08/21  Educator [facility]  Instruction Review Code 1- Verbalizes Understanding       Education: Sleep Hygiene -Provides group verbal and written instruction about how sleep can affect your health.  Define sleep hygiene, discuss sleep cycles and impact of sleep habits. Review good sleep hygiene tips.    Initial Review & Psychosocial Screening:  Initial Psych Review & Screening - 10/28/21 1448       Initial Review   Current issues with Current Stress Concerns    Source of Stress Concerns Family    Comments son passed 09/06/21; handicapped son still lives at home      [place]? Yes   daughter, neighbors     Barriers   Psychosocial barriers to participate in program The patient should benefit from training in stress management and relaxation.      Screening Interventions   Interventions Encouraged to exercise;Provide feedback about the scores to participant;To provide support and resources with identified psychosocial needs    Expected Outcomes Short Term  goal: Utilizing psychosocial counselor, staff and physician to assist with identification of specific Stressors or current issues interfering with healing process. Setting desired goal for each stressor or current issue identified.;Long Term Goal: Stressors or current issues are controlled or eliminated.;Long Term goal: The participant improves quality of Life and PHQ9  Scores as seen by post scores and/or verbalization of changes;Short Term goal: Identification and review with participant of any Quality of Life or Depression concerns found by scoring the questionnaire.             Quality of Life Scores:   Quality of Life - 02/02/22 0955       Quality of Life Scores   Health/Function Pre 18.6 %    Health/Function Post 21.67 %    Health/Function % Change 16.51 %    Socioeconomic Pre 26.25 %    Socioeconomic Post 25.69 %    Socioeconomic % Change  -2.13 %    Psych/Spiritual Pre 25.71 %    Psych/Spiritual Post 23.79 %    Psych/Spiritual % Change -7.47 %    Family Pre 22.2 %    Family Post 26.4 %    Family % Change 18.92 %    GLOBAL Pre 22.29 %    GLOBAL Post 23.69 %    GLOBAL % Change 6.28 %            Scores of 19 and below usually indicate a poorer quality of life in these areas.  A difference of  2-3 points is a clinically meaningful difference.  A difference of 2-3 points in the total score of the Quality of Life Index has been associated with significant improvement in overall quality of life, self-image, physical symptoms, and general health in studies assessing change in quality of life.  PHQ-9: Recent Review Flowsheet Data     Depression screen [facility] 2/9 02/02/2022 11/14/2021 10/25/2021 01/31/2021 06/29/2020   Decreased Interest 1 0 1 0 0   Down, Depressed, Hopeless '1 1 1 1 ' 0   PHQ - 2 Score '2 1 2 1 ' 0   Altered sleeping 1 0 1 0 -   Tired, decreased energy '2 2 3 1 ' -   Change in appetite '1 1 1 ' 0 -   Feeling bad or failure about yourself  0 0 0 0 -   Trouble concentrating 0 0  0 0 -   Moving slowly or fidgety/restless 0 0 0 0 -   Suicidal thoughts 0 0 0 0 -   PHQ-9 Score '6 4 7 2 ' -   Difficult doing work/chores Somewhat difficult Somewhat difficult Somewhat difficult Not difficult at all -      Interpretation of Total Score  Total Score Depression Severity:  1-4 = Minimal depression, 5-9 = Mild depression, 10-14 = Moderate depression, 15-19 = Moderately severe depression, 20-27 = Severe depression   Psychosocial Evaluation and Intervention:  Psychosocial Evaluation - 10/28/21 1500       Psychosocial Evaluation & Interventions   Interventions Encouraged to exercise with the program and follow exercise prescription;Stress management education    Comments Gary Mueller reports feeling well 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 Prior to his hospitalization, he was walking 8-10,000 steps a day and golfing three times a week. He wants to get back to that and is trying to work his way up to it. When asked about stress, he replied that he guesses he is under some and his doctor thinks he is as well. His 61 year old son passed away 09/29/21 so he is still dealing with that. His daughter lives local and his handicapped son lives with him. He remembers brining his wife, who has now passed away, to [employer] and thinks this program will be good for him and will give him guidance.    Expected Outcomes Short: attend Cardiac  Rehab for education and exercise. Long: develop and maintain positive self care habits.    Continue Psychosocial Services  Follow up required by staff             Psychosocial Re-Evaluation:  Psychosocial Re-Evaluation     [place] Name 12/06/21 1008 12/27/21 1001 01/24/22 1021         Psychosocial Re-Evaluation   Current issues with Current Stress Concerns Current Stress Concerns Current Stress Concerns     Comments Gary Mueller is doing well in rehab. He is still coping with losing his wife an son so close together.  He is planning to start to meeting with a therapist.  He feels that  his living kids are still a great support system and help him care for his handicapped son living with him too.  He feels good and denies depression but still has those hard to deal with feelings that he wants to meet to talk with therapist about as he copes with his greif.  He notes that he has a good life and a lot to still live for with grandkids too. Gary Mueller is doing well mentally.  He is working hard on his mental health.  He is trying to cope with his wife and son.  He is still waiting for a call back to meet with therapist.  He feels he has a lot to live for.  He will get an appointment set up with a therapist that knows his doctor. Gary Mueller was able to get out on golf course on Saturday for 18 holes.  He continues to have some down days.  He is still waiting to see therapist as her finishes up rehab and a few other appointments before adding to his schedule.  He does his best to stay positive and focus on the good.     Expected Outcomes Short: Start meeting with therapist Long: Continue to focus on the positive. Short: Start meeting with therapist Long: Continue to focus on the positive. Short: Continue to play golf Long: continue to stay positive     Interventions Encouraged to attend Cardiac Rehabilitation for the exercise;Stress management education Encouraged to attend Cardiac Rehabilitation for the exercise;Stress management education Encouraged to attend Cardiac Rehabilitation for the exercise     Continue Psychosocial Services  Follow up required by staff Follow up required by staff --       Initial Review   Comments son passed 09/06/21; handicapped son still lives at home -- --              Psychosocial Discharge (Final Psychosocial Re-Evaluation):  Psychosocial Re-Evaluation - 01/24/22 1021       Psychosocial Re-Evaluation   Current issues with Current Stress Concerns    Comments Gary Mueller was able to get out on golf course on Saturday for 18 holes.  He continues to have some down days.  He  is still waiting to see therapist as her finishes up rehab and a few other appointments before adding to his schedule.  He does his best to stay positive and focus on the good.    Expected Outcomes Short: Continue to play golf Long: continue to stay positive    Interventions Encouraged to attend Cardiac Rehabilitation for the exercise             Vocational Rehabilitation: Provide vocational rehab assistance to qualifying candidates.   Vocational Rehab Evaluation & Intervention:  Vocational Rehab - 10/28/21 1448       Initial Vocational Rehab Evaluation &  Intervention   Assessment shows need for Vocational Rehabilitation No             Education: Education Goals: Education classes will be provided on a variety of topics geared toward better understanding of heart health and risk factor modification. Participant will state understanding/return demonstration of topics presented as noted by education test scores.  Learning Barriers/Preferences:  Learning Barriers/Preferences - 10/28/21 1448       Learning Barriers/Preferences   Learning Barriers None    Learning Preferences None             General Cardiac Education Topics:  AED/CPR: - Group verbal and written instruction with the use of models to demonstrate the basic use of the AED with the basic ABC's of resuscitation.   Anatomy and Cardiac Procedures: - Group verbal and visual presentation and models provide information about basic cardiac anatomy and function. Reviews the testing methods done to diagnose heart disease and the outcomes of the test results. Describes the treatment choices: Medical Management, Angioplasty, or Coronary Bypass Surgery for treating various heart conditions including Myocardial Infarction, Angina, Valve Disease, and Cardiac Arrhythmias.  Written material given at graduation. Flowsheet Row Cardiac Rehab from 02/23/2022 in [facility] Cardiac and Pulmonary Rehab  Date 12/29/21  Educator SB   Instruction Review Code 1- Verbalizes Understanding       Medication Safety: - Group verbal and visual instruction to review commonly prescribed medications for heart and lung disease. Reviews the medication, class of the drug, and side effects. Includes the steps to properly store meds and maintain the prescription regimen.  Written material given at graduation. Flowsheet Row Cardiac Rehab from 02/23/2022 in [facility] Cardiac and Pulmonary Rehab  Date 01/12/22  Educator [facility]  Instruction Review Code 1- Verbalizes Understanding       Intimacy: - Group verbal instruction through game format to discuss how heart and lung disease can affect sexual intimacy. Written material given at graduation.. Flowsheet Row Cardiac Rehab from 02/23/2022 in [facility] Cardiac and Pulmonary Rehab  Date 12/22/21  Educator [facility]  Instruction Review Code 1- Verbalizes Understanding       Know Your Numbers and Heart Failure: - Group verbal and visual instruction to discuss disease risk factors for cardiac and pulmonary disease and treatment options.  Reviews associated critical values for Overweight/Obesity, Hypertension, Cholesterol, and Diabetes.  Discusses basics of heart failure: signs/symptoms and treatments.  Introduces Heart Failure Zone chart for action plan for heart failure.  Written material given at graduation. Flowsheet Row Cardiac Rehab from 02/23/2022 in [facility] Cardiac and Pulmonary Rehab  Date 11/24/21  Educator SB  Instruction Review Code 1- Verbalizes Understanding       Infection Prevention: - Provides verbal and written material to individual with discussion of infection control including proper hand washing and proper equipment cleaning during exercise session. Flowsheet Row Cardiac Rehab from 02/23/2022 in [facility] Cardiac and Pulmonary Rehab  Date 11/14/21  Educator AS  Instruction Review Code 1- Verbalizes Understanding       Falls Prevention: - Provides verbal and written material to individual  with discussion of falls prevention and safety. Flowsheet Row Cardiac Rehab from 02/23/2022 in [facility] Cardiac and Pulmonary Rehab  Date 11/14/21  Educator AS  Instruction Review Code 1- Verbalizes Understanding       Other: -Provides group and verbal instruction on various topics (see comments)   Knowledge Questionnaire Score:  Knowledge Questionnaire Score - 02/02/22 0955       Knowledge Questionnaire Score   Pre Score  24/26    Post Score 25/26             Core Components/Risk Factors/Patient Goals at Admission:  Personal Goals and Risk Factors at Admission - 11/14/21 1602       Core Components/Risk Factors/Patient Goals on Admission   Hypertension Yes    Intervention Provide education on lifestyle modifcations including regular physical activity/exercise, weight management, moderate sodium restriction and increased consumption of fresh fruit, vegetables, and low fat dairy, alcohol moderation, and smoking cessation.;Monitor prescription use compliance.    Expected Outcomes Short Term: Continued assessment and intervention until BP is < 140/53m HG in hypertensive participants. < 130/85mHG in hypertensive participants with diabetes, heart failure or chronic kidney disease.;Long Term: Maintenance of blood pressure at goal levels.    Lipids Yes    Intervention Provide education and support for participant on nutrition & aerobic/resistive exercise along with prescribed medications to achieve LDL <7032mHDL >20m20m  Expected Outcomes Short Term: Participant states understanding of desired cholesterol values and is compliant with medications prescribed. Participant is following exercise prescription and nutrition guidelines.;Long Term: Cholesterol controlled with medications as prescribed, with individualized exercise RX and with personalized nutrition plan. Value goals: LDL < 70mg36mL > 40 mg.             Education:Diabetes - Individual verbal and written instruction to  review signs/symptoms of diabetes, desired ranges of glucose level fasting, after meals and with exercise. Acknowledge that pre and post exercise glucose checks will be done for 3 sessions at entry of program.   Core Components/Risk Factors/Patient Goals Review:   Goals and Risk Factor Review     Row Name 12/06/21 1012 12/27/21 0956 01/24/22 1024         Core Components/Risk Factors/Patient Goals Review   Personal Goals Review Weight Management/Obesity;Hypertension;Lipids Weight Management/Obesity;Hypertension;Lipids Weight Management/Obesity;Hypertension;Lipids;Improve shortness of breath with ADL's     Review Gary Mueller Gary Landmarkoing well in rehab.  He lost some weight initially post MI, but is now holding steady around 169-170 lb.  Prior to all this he was 175-176 lb and would like to at least continue to build his strength back up.  His blood pressures are doing well in class.  He does not check them at home as he had never had any issues with it prior to heart event.  He is staying active at home.  He really wants to get back to golf again so we talked about the progression from bucket of balls back to the course. Gary Mueller Gary Landmarkoing well in rehab.  He has noted that his breathing is getting better with the PLB and his cardiac cough from meds has improved too. His weight is up 2 lbs since starting.  He is eating a little more and not as healthy with the holidays. He is hoping to hold steady here as he feels healthy here.  His pressures continue to do well and he continues to check it at home. Gary Mueller Gary Landmarkoing well. His weight is holding steady.  He would like to keep it where he is.  His breathing is doing well and he use PLB to help.  His pressures are doing well.     Expected Outcomes -- Short: Conitnue to maintain weight Long: Continue to monitor risk factors. Short: Conitnue to maintain weight Long: Continue to monitor risk factors.              Core Components/Risk Factors/Patient Goals at Discharge  (Final Review):  Goals and Risk Factor Review - 01/24/22 1024       Core Components/Risk Factors/Patient Goals Review   Personal Goals Review Weight Management/Obesity;Hypertension;Lipids;Improve shortness of breath with ADL's    Review Gary Mueller is doing well. His weight is holding steady.  He would like to keep it where he is.  His breathing is doing well and he use PLB to help.  His pressures are doing well.    Expected Outcomes Short: Conitnue to maintain weight Long: Continue to monitor risk factors.             ITP Comments:  ITP Comments     Row Name 10/28/21 1444 11/14/21 1605 11/22/21 0957 11/23/21 0618 11/28/21 0854   ITP Comments Initial telephone orientation completed. Documentation can be found in [facility] 10/25. EP orientation scheduled for Monday 11/21 at 3pm. Completed 6MWT and gym orientation. Initial ITP created and sent for review to Dr.Mark Sabra Heck, Medical Director. First full day of exercise!  Patient was oriented to gym and equipment including functions, settings, policies, and procedures.  Patient's individual exercise prescription and treatment plan were reviewed.  All starting workloads were established based on the results of the 6 minute walk test done at initial orientation visit.  The plan for exercise progression was also introduced and progression will be customized based on patient's performance and goals. 30 Day review completed. Medical Director ITP review done, changes made as directed, and signed approval by Medical Director.   New to program Completed initial RD consultation    Row Name 12/21/21 [phone] 01/18/22 0806 02/15/22 0729       ITP Comments 30 Day review completed. Medical Director ITP review done, changes made as directed, and signed approval by Medical Director. 30 Day review completed. Medical Director ITP review done, changes made as directed, and signed approval by Medical Director. 30 Day review completed. Medical Director ITP review done, changes made  as directed, and signed approval by Medical Director.              Comments: discharge ITP

## 2022-02-23 NOTE — Progress Notes (Signed)
Daily Session Note ? ?Patient Details  ?Name: Gary Mueller ?MRN: [id number] ?Date of Birth: 03/09/1941 ?Referring Provider:   ?Flowsheet Row Cardiac Rehab from 11/14/2021 in [facility]  ?Referring Provider Gary Mueller  ? ?  ? ? ?Encounter Date: 02/23/2022 ? ?Check In: ? Session Check In - 02/23/22 1015   ? ?  ? Check-In  ? Supervising physician immediately available to respond to emergencies See telemetry face sheet for immediately available ER MD   ? Location [facility]   ? Staff Present Kelly Bollinger, MPA, RN;Melissa Caiola, RDN, LDN;Jessica Hawkins, MA, RCEP, CCRP, CCET;Kelly Hayes, BS, ACSM CEP, Exercise Physiologist   ? Virtual Visit No   ? Medication changes reported     No   ? Fall or balance concerns reported    No   ? Tobacco Cessation No Change   ? Warm-up and Cool-down Performed on first and last piece of equipment   ? Resistance Training Performed Yes   ? VAD Patient? No   ? PAD/SET Patient? No   ?  ? Pain Assessment  ? Currently in Pain? No/denies   ? ?  ?  ? ?  ? ? ? ? ? ?Social History  ? ?Tobacco Use  ?Smoking Status Former  ? Packs/day: 1.50  ? Types: Cigarettes  ? Quit date: 12/24/1964  ? Years since quitting: 57.2  ?Smokeless Tobacco Never  ? ? ?Goals Met:  ?Independence with exercise equipment ?Exercise tolerated well ?No report of concerns or symptoms today ?Strength training completed today ? ?Goals Unmet:  ?Not Applicable ? ?Comments:  Gary Mueller graduated today from  rehab with 36 sessions completed.  Details of the patient's exercise prescription and what He needs to do in order to continue the prescription and progress were discussed with patient.  Patient was given a copy of prescription and goals.  Patient verbalized understanding.  Gary Mueller plans to continue to exercise by walking at home and using staff videos. ? ? ? ?Dr. Mark Miller is Medical Director for [facility].  ?Dr. Fuad Aleskerov is Medical Director for [facility]. ?

## 2022-02-23 NOTE — Progress Notes (Signed)
Discharge Progress Report ? ?Patient Details  ?Name: Gary Mueller ?MRN: [id number] ?Date of Birth: 1941-03-04 ?Referring Provider:   ?Flowsheet Row Cardiac Rehab from 11/14/2021 in [facility]  ?Referring Provider Par[facility]  ? ?  ? ? ? ?Number of Visits: 45 ? ?Reason for Discharge:  ?Patient reached a stable level of exercise. ?Patient independent in their exercise. ?Patient has met program and personal goals. ? ?Smoking History:  ?Social History  ? ?Tobacco Use  ?Smoking Status Former  ? Packs/day: 1.50  ? Types: Cigarettes  ? Quit date: 12/24/1964  ? Years since quitting: 57.2  ?Smokeless Tobacco Never  ? ? ?Diagnosis:  ?NSTEMI (non-ST elevated myocardial infarction) ([place]) ? ?Status post coronary artery stent placement ? ?ADL [facility]: ? ? ?Initial Exercise Prescription: ? Initial Exercise Prescription - 11/14/21 1500   ? ?  ? Date of Initial Exercise RX and Referring Provider  ? Date 11/14/21   ? Referring Provider Paraschos   ?  ? Oxygen  ? Maintain Oxygen Saturation 88% or higher   ?  ? Treadmill  ? MPH 2   ? Grade 0.5   ? Minutes 15   ? METs 2.6   ?  ? NuStep  ? Level 2   ? SPM 80   ? Minutes 15   ? METs 2.1   ?  ? Recumbant Elliptical  ? Level 1   ? RPM 50   ? Minutes 15   ? METs 2.1   ?  ? Biostep-RELP  ? Level 2   ? SPM 50   ? Minutes 15   ? METs 2   ?  ? Prescription Details  ? Frequency (times per week) 3   ? Duration Progress to 30 minutes of continuous aerobic without signs/symptoms of physical distress   ?  ? Intensity  ? THRR 40-80% of Max Heartrate 91-122   ? Ratings of Perceived Exertion 11-13   ? Perceived Dyspnea 0-4   ?  ? Resistance Training  ? Training Prescription Yes   ? Weight 3 lb   ? Reps 10-15   ? ?  ?  ? ?  ? ? ?Discharge Exercise Prescription (Final Exercise Prescription Changes): ? Exercise Prescription Changes - 02/14/22 0800   ? ?  ? Response to Exercise  ? Blood Pressure (Admit) 130/54   ? Blood Pressure (Exit) 100/60   ? Heart Rate (Admit) 58 bpm   ? Heart Rate  (Exercise) 72 bpm   ? Heart Rate (Exit) 70 bpm   ? Oxygen Saturation (Admit) 97 %   ? Oxygen Saturation (Exercise) 94 %   ? Oxygen Saturation (Exit) 96 %   ? Rating of Perceived Exertion (Exercise) 14   ? Symptoms none   ? Duration Continue with 30 min of aerobic exercise without signs/symptoms of physical distress.   ? Intensity THRR unchanged   ?  ? Progression  ? Progression Continue to progress workloads to maintain intensity without signs/symptoms of physical distress.   ? Average METs 3.88   ?  ? Resistance Training  ? Training Prescription Yes   ? Weight 5 lb   ? Reps 10-15   ?  ? Interval Training  ? Interval Training No   ?  ? Treadmill  ? MPH 2.6   ? Grade 3   ? Minutes 15   ? METs 4.66   ?  ? Recumbant Elliptical  ? Level 1   ? Minutes 15   ?  METs 1.7   ?  ? REL-XR  ? Level 9   ? Minutes 15   ? METs 4.9   ?  ? Home Exercise Plan  ? Plans to continue exercise at Home (comment)   walking, staff videos  ? Frequency Add 2 additional days to program exercise sessions.   ? Initial Home Exercises Provided 12/06/21   ? ?  ?  ? ?  ? ? ?Functional Capacity: ? 6 Minute Walk   ? ? [place] Name 11/14/21 1549 01/31/22 1021  ?  ?  ? 6 Minute Walk  ? Phase Initial Discharge   ? Distance 1205 feet 1318 feet   ? Distance % Change -- 9.4 %   ? Distance Feet Change -- 113 ft   ? Walk Time 6 minutes 6 minutes   ? # of Rest Breaks 0 0   ? MPH 2.28 2.5   ? METS 2.1 2.19   ? RPE 11 11   ? Perceived Dyspnea  1 --   ? VO2 Peak 7.4 7.67   ? Symptoms No No   ? Resting HR 60 bpm 57 bpm   ? Resting BP 104/60 120/66   ? Resting Oxygen Saturation  98 % 93 %   ? Exercise Oxygen Saturation  during 6 min walk 97 % 93 %   ? Max Ex. HR 76 bpm 68 bpm   ? Max Ex. BP 132/60 128/64   ? 2 Minute Post BP 114/58 --   ? ?  ?  ? ?  ? ? ?Psychological, QOL, Others - Outcomes: ?PHQ 2/9: ?Depression screen [facility] 2/9 02/02/2022 11/14/2021 10/25/2021 01/31/2021 06/29/2020  ?Decreased Interest 1 0 1 0 0  ?Down, Depressed, Hopeless _0 0  ?PHQ - 2 Score _1 0   ?Altered sleeping 1 0 1 0 -  ?Tired, decreased energy _2 -  ?Change in appetite _3 0 -  ?Feeling bad or failure about yourself  0 0 0 0 -  ?Trouble concentrating 0 0 0 0 -  ?Moving slowly or fidgety/restless 0 0 0 0 -  ?Suicidal thoughts 0 0 0 0 -  ?PHQ-9 Score _4 -  ?Difficult doing work/chores Somewhat difficult Somewhat difficult Somewhat difficult Not difficult at all -  ? ? ?Quality of Life: ? Quality of Life - 02/02/22 0955   ? ?  ? Quality of Life Scores  ? Health/Function Pre 18.6 %   ? Health/Function Post 21.67 %   ? Health/Function % Change 16.51 %   ? Socioeconomic Pre 26.25 %   ? Socioeconomic Post 25.69 %   ? Socioeconomic % Change  -2.13 %   ? Psych/Spiritual Pre 25.71 %   ? Psych/Spiritual Post 23.79 %   ? Psych/Spiritual % Change -7.47 %   ? Family Pre 22.2 %   ? Family Post 26.4 %   ? Family % Change 18.92 %   ? GLOBAL Pre 22.29 %   ? GLOBAL Post 23.69 %   ? GLOBAL % Change 6.28 %   ? ?  ?  ? ?  ? ? ? ?Nutrition & Weight - Outcomes: ? Pre Biometrics - 11/14/21 1601   ? ?  ? Pre Biometrics  ? Height _5  (1.778 m)   ? Weight 168 lb 1.6 oz (76.2 kg)   ? BMI (Calculated) 24.12   ? Single Leg Stand 2.2 seconds   ? ?  ?  ? ?  ? ?  Post Biometrics - 01/31/22 1022   ? ?  ?  Post  Biometrics  ? Height _0  (1.778 m)   ? Weight 170 lb 1.6 oz (77.2 kg)   ? BMI (Calculated) 24.41   ? Single Leg Stand 4.3 seconds   ? ?  ?  ? ?  ? ? ?Nutrition: ? Nutrition Therapy & Goals - 11/28/21 0818   ? ?  ? Nutrition Therapy  ? Diet Heart healthy, low Na   ? Drug/Food Interactions Statins/Certain Fruits   ? Protein (specify units) 60g   ? Fiber 30 grams   ? Whole Grain Foods 3 servings   ? Saturated Fats 12 max. grams   ? Fruits and Vegetables 8 servings/day   ?  ? Personal Nutrition Goals  ? Nutrition Goal ST: read lables of ready to eat canned foods for sodium content, make sure having three good sources of protein daily (if none at lunch have a boiled egg with snack), explore different frozen vegeables  for variety  LT: limit Na <1.5g/day, eat rainbow of fruits and vegetables each week, meet protein needs.   ? Comments He prepares many of his own meals, but he is not a good cook; he eats lots of legumes, grains, and fruit/vegetables. B: coffee with some honey with his medications and yogurt L: cup of soup or sandwich (pimento cheese or pb and J on whole wheat bread) S: crackers or an apple - he also loves blueberries S: lightly salted pecans D: sandwich or protein like pork, beans (3-4x/week), chicken. Chicken pot pie, soup (cambell soup). 2x/week he will have steamed brussels sprouts or broccoli. He likes salads with chicken when he goes out to eat. S: sometimes will have dessert like piece of cheesecake or pie or angel food cake Drinks: water, orange juice. He does not add salt to his foods. Discussed heart healthy eating.   ?  ? Intervention Plan  ? Intervention Prescribe, educate and counsel regarding individualized specific dietary modifications aiming towards targeted core components such as weight, hypertension, lipid management, diabetes, heart failure and other comorbidities.;Nutrition handout(s) given to patient.   ? Expected Outcomes Short Term Goal: Understand basic principles of dietary content, such as calories, fat, sodium, cholesterol and nutrients.;Short Term Goal: A plan has been developed with personal nutrition goals set during dietitian appointment.;Long Term Goal: Adherence to prescribed nutrition plan.   ? ?  ?  ? ?  ? ? ?Nutrition Discharge: ? ? ?Education Questionnaire Score: ? Knowledge Questionnaire Score - 02/02/22 0955   ? ?  ? Knowledge Questionnaire Score  ? Pre Score 24/26   ? Post Score 25/26   ? ?  ?  ? ?  ? ? ?Goals reviewed with patient; copy given to patient. ?

## 2022-03-01 ENCOUNTER — Ambulatory Visit (INDEPENDENT_AMBULATORY_CARE_PROVIDER_SITE_OTHER): Payer: Medicare Other | Admitting: Family Medicine

## 2022-03-01 ENCOUNTER — Other Ambulatory Visit: Payer: Self-pay

## 2022-03-01 ENCOUNTER — Encounter: Payer: Self-pay | Admitting: Family Medicine

## 2022-03-01 VITALS — BP 131/69 | HR 52 | Temp 98.0°F | Resp 16 | Ht 72.0 in | Wt 169.0 lb

## 2022-03-01 DIAGNOSIS — I2510 Atherosclerotic heart disease of native coronary artery without angina pectoris: Secondary | ICD-10-CM

## 2022-03-01 DIAGNOSIS — I4891 Unspecified atrial fibrillation: Secondary | ICD-10-CM | POA: Diagnosis not present

## 2022-03-01 DIAGNOSIS — E039 Hypothyroidism, unspecified: Secondary | ICD-10-CM | POA: Diagnosis not present

## 2022-03-01 DIAGNOSIS — E785 Hyperlipidemia, unspecified: Secondary | ICD-10-CM

## 2022-03-01 DIAGNOSIS — I10 Essential (primary) hypertension: Secondary | ICD-10-CM

## 2022-03-01 DIAGNOSIS — I214 Non-ST elevation (NSTEMI) myocardial infarction: Secondary | ICD-10-CM

## 2022-03-01 DIAGNOSIS — J301 Allergic rhinitis due to pollen: Secondary | ICD-10-CM

## 2022-03-01 MED ORDER — FLUTICASONE PROPIONATE 50 MCG/ACT NA SUSP: 2.0000 | Freq: Every day | NASAL | 6 refills | Status: DC

## 2022-03-01 NOTE — Progress Notes (Signed)
?  ? ? ?Established patient visit ? ?I,April Miller,acting as a scribe for Wilhemena Durie, MD.,have documented all relevant documentation on the behalf of Wilhemena Durie, MD,as directed by  Wilhemena Durie, MD while in the presence of Wilhemena Durie, MD. ? ? ?Patient: Gary Mueller   DOB: Dec 05, 1941   81 y.o. Male  MRN: [id number] ?Visit Date: 03/01/2022 ? ?Today's healthcare provider: Wilhemena Durie, MD  ? ?Chief Complaint  ?Patient presents with  ? Follow-up  ? Hypertension  ? ?Subjective  ?  ?HPI  ?Patient comes in today for follow-up.  He is doing fairly well.  Heart disease followed by cardiology and stable.  Cardiac rehab has helped him recently. ?He feels well.  He continues to live independently.  He is enjoying retirement.  He is tolerating his medications. ?Hypertension, follow-up ? ?BP Readings from Last 3 Encounters:  ?03/01/22 131/69  ?10/25/21 (!) 150/63  ?10/20/21 126/60  ? Wt Readings from Last 3 Encounters:  ?03/01/22 169 lb (76.7 kg)  ?01/31/22 170 lb 1.6 oz (77.2 kg)  ?11/14/21 168 lb 1.6 oz (76.2 kg)  ?  ? ?He was last seen for hypertension 4 months ago.  ?BP at that visit was 150/63. ?Management since that visit includes; Lisinopril and metoprolol. ?He reports good compliance with treatment. ?He is not having side effects. none ?He is exercising. ?He is adherent to low salt diet.   ?Outside blood pressures are not checking at home. ? ?He does not smoke. ? ?Use of agents associated with hypertension: none.  ? ?--------------------------------------------------------------------------------------------------- ? ? ?Medications: ?Outpatient Medications Prior to Visit  ?Medication Sig  ? acetaminophen (TYLENOL) 500 MG tablet Take 1,000 mg by mouth daily as needed for moderate pain.  ? amLODipine (NORVASC) 5 MG tablet TAKE ONE TABLET EVERY DAY  ? ascorbic acid (VITAMIN C) 1000 MG tablet Take 1 tablet by mouth daily.  ? aspirin EC 81 MG EC tablet Take 1 tablet (81 mg total) by mouth  daily. Swallow whole.  ? folic acid (FOLVITE) 1 MG tablet Take 1 mg by mouth daily.  ? levothyroxine (SYNTHROID) 100 MCG tablet TAKE 1 TABLET EVERY DAY ON EMPTY STOMACHWITH A GLASS OF WATER AT LEAST 30-60 MINBEFORE BREAKFAST  ? losartan (COZAAR) 50 MG tablet Take 1 tablet (50 mg total) by mouth daily.  ? methotrexate 2.5 MG tablet Take 15 mg by mouth every Wednesday.  ? metoprolol succinate (TOPROL-XL) 25 MG 24 hr tablet TAKE 1/2 TABLET BY MOUTH EVERY EVENING  ? Multiple Vitamin (MULTIVITAMIN WITH MINERALS) TABS tablet Take 1 tablet by mouth every evening.   ? nitroGLYCERIN (NITROSTAT) 0.4 MG SL tablet Place 1 tablet (0.4 mg total) under the tongue every 5 (five) minutes as needed for chest pain.  ? pantoprazole (PROTONIX) 40 MG tablet TAKE ONE TABLET (40 MG) BY MOUTH EVERY DAY  ? rosuvastatin (CRESTOR) 40 MG tablet TAKE ONE TABLET BY MOUTH EVERY DAY  ? ticagrelor (BRILINTA) 90 MG TABS tablet Take 1 tablet (90 mg total) by mouth 2 (two) times daily.  ? vitamin E 180 MG (400 UNITS) capsule Take 1 capsule by mouth daily.  ? ?No facility-administered medications prior to visit.  ? ? ?Review of Systems  ?Constitutional:  Negative for appetite change, chills and fever.  ?Respiratory:  Negative for chest tightness, shortness of breath and wheezing.   ?Cardiovascular:  Negative for chest pain and palpitations.  ?Gastrointestinal:  Negative for abdominal pain, nausea and vomiting.  ? ?Last lipids ?Lab Results  ?  Component Value Date  ? CHOL 154 10/26/2021  ? HDL 38 (L) 10/26/2021  ? [place] 99 10/26/2021  ? TRIG 89 10/26/2021  ? CHOLHDL 5.0 02/01/2021  ? ?  ?  Objective  ?  ?BP 131/69 (BP Location: Left Arm, Patient Position: Sitting, Cuff Size: Normal)   Pulse (!) 52   Temp 98 ?F (36.7 ?C) (Rectal)   Resp 16   Ht 6' (1.829 m)   Wt 169 lb (76.7 kg)   SpO2 97%   BMI 22.92 kg/m?  ?BP Readings from Last 3 Encounters:  ?03/01/22 131/69  ?10/25/21 (!) 150/63  ?10/20/21 126/60  ? ?Wt Readings from Last 3 Encounters:   ?03/01/22 169 lb (76.7 kg)  ?01/31/22 170 lb 1.6 oz (77.2 kg)  ?11/14/21 168 lb 1.6 oz (76.2 kg)  ? ?  ? ?Physical Exam ?Vitals reviewed.  ?Constitutional:   ?   Appearance: Normal appearance. He is well-developed.  ?HENT:  ?   Head: Normocephalic and atraumatic.  ?   Right Ear: External ear normal.  ?   Left Ear: External ear normal.  ?   Nose: Nose normal.  ?Eyes:  ?   General: No scleral icterus. ?   Conjunctiva/sclera: Conjunctivae normal.  ?Neck:  ?   Thyroid: No thyromegaly.  ?   Vascular: No carotid bruit.  ?Cardiovascular:  ?   Rate and Rhythm: Normal rate and regular rhythm.  ?   Pulses: Normal pulses.  ?   Heart sounds: Normal heart sounds.  ?Pulmonary:  ?   Effort: Pulmonary effort is normal.  ?   Breath sounds: Normal breath sounds.  ?Abdominal:  ?   Palpations: Abdomen is soft.  ?Lymphadenopathy:  ?   Cervical: No cervical adenopathy.  ?Skin: ?   General: Skin is warm and dry.  ?   Capillary Refill: Capillary refill takes less than 2 seconds.  ?Neurological:  ?   General: No focal deficit present.  ?   Mental Status: He is alert and oriented to person, place, and time.  ?Psychiatric:     ?   Mood and Affect: Mood normal.     ?   Behavior: Behavior normal.     ?   Thought Content: Thought content normal.     ?   Judgment: Judgment normal.  ?  ? ? ?No results found for any visits on 03/01/22. ? Assessment & Plan  ?  ? ?1. Essential hypertension ?Uncontrolled blood pressure ? ?2. NSTEMI (non-ST elevated myocardial infarction) ([place]) ?All risk factors treated.  He is followed by cardiac rehab recently and also cardiology ? ?3. Adult hypothyroidism ? ? ?4. CAD in native artery ?\ ? ?5. Atrial fibrillation, unspecified type ([place]) ? ? ?6. Hyperlipidemia, unspecified hyperlipidemia type ?On Crestor 40 ? ?7. Seasonal allergic rhinitis due to pollen ?Try Flonase nasal spray. ? ? ?No follow-ups on file.  ?   ?I, Wilhemena Durie, MD, have reviewed all documentation for this visit. The documentation on 03/05/22  for the exam, diagnosis, procedures, and orders are all accurate and complete.  ? ?Wilhemena Durie, MD  ?[facility] ?[phone] (phone) ?[phone] (fax) ? ?[facility] ?

## 2022-03-06 DIAGNOSIS — Z961 Presence of intraocular lens: Secondary | ICD-10-CM | POA: Diagnosis not present

## 2022-03-06 DIAGNOSIS — H35352 Cystoid macular degeneration, left eye: Secondary | ICD-10-CM | POA: Diagnosis not present

## 2022-03-06 DIAGNOSIS — H02409 Unspecified ptosis of unspecified eyelid: Secondary | ICD-10-CM | POA: Diagnosis not present

## 2022-03-06 DIAGNOSIS — H0015 Chalazion left lower eyelid: Secondary | ICD-10-CM | POA: Diagnosis not present

## 2022-03-06 DIAGNOSIS — H353231 Exudative age-related macular degeneration, bilateral, with active choroidal neovascularization: Secondary | ICD-10-CM | POA: Diagnosis not present

## 2022-03-06 DIAGNOSIS — H02402 Unspecified ptosis of left eyelid: Secondary | ICD-10-CM | POA: Diagnosis not present

## 2022-03-06 DIAGNOSIS — H40009 Preglaucoma, unspecified, unspecified eye: Secondary | ICD-10-CM | POA: Diagnosis not present

## 2022-03-06 DIAGNOSIS — H353132 Nonexudative age-related macular degeneration, bilateral, intermediate dry stage: Secondary | ICD-10-CM | POA: Diagnosis not present

## 2022-03-15 ENCOUNTER — Ambulatory Visit: Payer: Medicare Other

## 2022-04-03 ENCOUNTER — Other Ambulatory Visit: Payer: Self-pay | Admitting: Family Medicine

## 2022-04-03 DIAGNOSIS — H353231 Exudative age-related macular degeneration, bilateral, with active choroidal neovascularization: Secondary | ICD-10-CM | POA: Diagnosis not present

## 2022-04-04 DIAGNOSIS — H903 Sensorineural hearing loss, bilateral: Secondary | ICD-10-CM | POA: Insufficient documentation

## 2022-04-04 DIAGNOSIS — I10 Essential (primary) hypertension: Secondary | ICD-10-CM | POA: Insufficient documentation

## 2022-04-04 DIAGNOSIS — Z461 Encounter for fitting and adjustment of hearing aid: Secondary | ICD-10-CM | POA: Insufficient documentation

## 2022-04-04 DIAGNOSIS — I4891 Unspecified atrial fibrillation: Secondary | ICD-10-CM | POA: Insufficient documentation

## 2022-04-05 ENCOUNTER — Ambulatory Visit (INDEPENDENT_AMBULATORY_CARE_PROVIDER_SITE_OTHER): Payer: Medicare Other

## 2022-04-05 VITALS — BP 110/60 | HR 59 | Temp 96.3°F | Ht 72.0 in | Wt 166.6 lb

## 2022-04-05 DIAGNOSIS — Z0000 Encounter for general adult medical examination without abnormal findings: Secondary | ICD-10-CM | POA: Diagnosis not present

## 2022-04-05 NOTE — Progress Notes (Signed)
Subjective:   Gary Mueller is a 81 y.o. male who presents for Medicare Annual/Subsequent preventive examination.  Review of Systems           Objective:    Today's Vitals   04/05/22 1310  BP: 110/60  Pulse: (!) 59  Temp: (!) 96.3 F (35.7 C)  TempSrc: Skin  SpO2: 96%  Weight: 166 lb 9.6 oz (75.6 kg)  Height: 6' (1.829 m)   Body mass index is 22.6 kg/m.     10/28/2021    3:04 PM 10/19/2021    5:20 PM 10/18/2021    6:52 PM 06/29/2020    2:50 PM 04/30/2020    1:30 PM 04/22/2020   11:55 AM 02/07/2019    1:36 PM  Advanced Directives  Does Patient Have a Medical Advance Directive? Yes Yes Yes Yes Yes Yes Yes  Type of Advance Directive Living will Living will Living will [place];Living will Healthcare Power of [place];Living will [place];Living will  Does patient want to make changes to medical advance directive? No - Patient declined No - Patient declined   No - Patient declined No - Patient declined   Copy of [place] in Chart?    No - copy requested Yes - validated most recent copy scanned in chart (See row information) No - copy requested No - copy requested  Would patient like information on creating a medical advance directive?  No - Patient declined No - Patient declined        Current Medications (verified) Outpatient Encounter Medications as of 04/05/2022  Medication Sig   acetaminophen (TYLENOL) 500 MG tablet Take 1,000 mg by mouth daily as needed for moderate pain.   amLODipine (NORVASC) 5 MG tablet TAKE ONE TABLET EVERY DAY   ascorbic acid (VITAMIN C) 1000 MG tablet Take 1 tablet by mouth daily.   aspirin EC 81 MG EC tablet Take 1 tablet (81 mg total) by mouth daily. Swallow whole.   clopidogrel (PLAVIX) 75 MG tablet Take 75 mg by mouth daily.   fluticasone (FLONASE) 50 MCG/ACT nasal spray Place 2 sprays into both nostrils daily.   folic acid (FOLVITE) 1 MG tablet Take 1 mg  by mouth daily.   levothyroxine (SYNTHROID) 100 MCG tablet TAKE 1 TABLET EVERY DAY ON EMPTY STOMACHWITH A GLASS OF WATER AT LEAST 30-60 MINBEFORE BREAKFAST   losartan (COZAAR) 50 MG tablet Take 1 tablet (50 mg total) by mouth daily.   methotrexate 2.5 MG tablet Take 15 mg by mouth every Wednesday.   metoprolol succinate (TOPROL-XL) 25 MG 24 hr tablet TAKE 1/2 TABLET BY MOUTH EVERY EVENING   Multiple Vitamin (MULTIVITAMIN WITH MINERALS) TABS tablet Take 1 tablet by mouth every evening.    nitroGLYCERIN (NITROSTAT) 0.4 MG SL tablet Place 1 tablet (0.4 mg total) under the tongue every 5 (five) minutes as needed for chest pain.   pantoprazole (PROTONIX) 40 MG tablet TAKE ONE TABLET (40 MG) BY MOUTH EVERY DAY   rosuvastatin (CRESTOR) 40 MG tablet TAKE ONE TABLET BY MOUTH EVERY DAY   ticagrelor (BRILINTA) 90 MG TABS tablet Take 1 tablet (90 mg total) by mouth 2 (two) times daily.   vitamin E 180 MG (400 UNITS) capsule Take 1 capsule by mouth daily.   No facility-administered encounter medications on file as of 04/05/2022.    Allergies (verified) Codeine and Statins   History: Past Medical History:  Diagnosis Date   Allergy  Anxiety    Arthritis    Cancer ([place]) 10/20/2016   Basal Cell Skin Cancer; lip, neck   Cystoid macular edema of left eye 07/07/2019   Depression    Dysrhythmia    A FIB   GERD (gastroesophageal reflux disease)    HOH (hard of hearing)    Bilateral   Hyperlipidemia    Hypertension    Patient denies   Hypothyroidism    IBS (irritable bowel syndrome)    Inguinal hernia    bilateral   Pneumonia    Thyroid disease    Tinnitus of both ears    Past Surgical History:  Procedure Laterality Date   ANTERIOR VITRECTOMY Left 11/28/2018   Procedure: ANTERIOR VITRECTOMY;  Surgeon: Marchia Meiers, MD;  Location: [facility];  Service: Ophthalmology;  Laterality: Left;   CARDIAC CATHETERIZATION     CATARACT EXTRACTION W/PHACO Right 10/10/2018   Procedure: CATARACT EXTRACTION  PHACO AND INTRAOCULAR LENS PLACEMENT ([place]);  Surgeon: Marchia Meiers, MD;  Location: [facility];  Service: Ophthalmology;  Laterality: Right;  [place] 00:41 CDE 6.87 Fluid pack lot # [id number]   CATARACT EXTRACTION W/PHACO Left 11/28/2018   Procedure: CATARACT EXTRACTION PHACO AND INTRAOCULAR LENS PLACEMENT ([facility])-LEFT;  Surgeon: Marchia Meiers, MD;  Location: [facility];  Service: Ophthalmology;  Laterality: Left;  [place] 00:47.7 CDE 8.92 Fluid Pack lot # [id number]   CHOLECYSTECTOMY  1968   CHONDROPLASTY Right 01/02/2018   Procedure: CHONDROPLASTY;  Surgeon: Dereck Leep, MD;  Location: [facility];  Service: Orthopedics;  Laterality: Right;   COLONOSCOPY  2014   CORONARY STENT INTERVENTION N/A 10/19/2021   Procedure: CORONARY STENT INTERVENTION;  Surgeon: Isaias Cowman, MD;  Location: [place] [facility];  Service: Cardiovascular;  Laterality: N/A;   EYE SURGERY     HERNIA REPAIR Left 04/18/1996   inguinal hernia repair/ Dr Bary Castilla   HERNIA REPAIR Right 02/26/1996   Dr Bary Castilla   HERNIA REPAIR Left 08/07/2002   Dr Bary Castilla   JOINT REPLACEMENT     KNEE ARTHROPLASTY Right 04/30/2020   Procedure: COMPUTER ASSISTED TOTAL KNEE ARTHROPLASTY;  Surgeon: Dereck Leep, MD;  Location: [facility];  Service: Orthopedics;  Laterality: Right;   KNEE ARTHROSCOPY Right 01/02/2018   Procedure: ARTHROSCOPY KNEE;  Surgeon: Dereck Leep, MD;  Location: [facility];  Service: Orthopedics;  Laterality: Right;   KNEE ARTHROSCOPY Left 02/14/2012   partial menisectomy and chondroplasty   KNEE ARTHROSCOPY WITH MEDIAL MENISECTOMY Right 01/02/2018   Procedure: KNEE ARTHROSCOPY WITH MEDIAL MENISECTOMY;  Surgeon: Dereck Leep, MD;  Location: [facility];  Service: Orthopedics;  Laterality: Right;   LEFT HEART CATH AND CORONARY ANGIOGRAPHY N/A 10/19/2021   Procedure: LEFT HEART CATH AND CORONARY ANGIOGRAPHY;  Surgeon: Isaias Cowman, MD;  Location: [place] [facility];  Service: Cardiovascular;  Laterality: N/A;   TONSILLECTOMY      as a child   TOTAL KNEE ARTHROPLASTY Left 03/29/2015   [place] Dr. Marry Guan   VASECTOMY     Family History  Problem Relation Age of Onset   Heart disease Mother        A fib   Healthy Sister    Social History   Socioeconomic History   Marital status: Widowed    Spouse name: Lelon Frohlich   Number of children: 3   Years of education: Not on file   Highest education level: Some college, no degree  Occupational History   Occupation: retired  Tobacco Use   Smoking status: Former    Packs/day: 1.50  Types: Cigarettes    Quit date: 12/24/1964    Years since quitting: 57.3   Smokeless tobacco: Never  Vaping Use   Vaping Use: Never used  Substance and Sexual Activity   Alcohol use: Yes    Comment: 0-1 beer or glass of wine monthly   Drug use: No   Sexual activity: Not on file  Other Topics Concern   Not on file  Social History Narrative   Not on file   Social Determinants of Health   Financial Resource Strain: Not on file  Food Insecurity: Not on file  Transportation Needs: Not on file  Physical Activity: Not on file  Stress: Not on file  Social Connections: Not on file    Tobacco Counseling Counseling given: Not Answered   Clinical Intake:  Pre-visit preparation completed: Yes  Pain : No/denies pain     Nutritional Risks: None Diabetes: No  How often do you need to have someone help you when you read instructions, pamphlets, or other written materials from your doctor or pharmacy?: 1 - Never  Diabetic?no  Interpreter Needed?: No  Information entered by :: Kirke Shaggy LPN   Activities of Daily Living    03/29/2022    9:36 AM 03/01/2022    1:14 PM  In your present state of health, do you have any difficulty performing the following activities:  Hearing? 1 1  Vision? 1 1  Difficulty concentrating or making decisions? 0 0  Walking or climbing stairs? 0 0  Dressing or bathing? 0 0  Doing errands, shopping? 0 0  Preparing Food and eating ? N   Using the  Toilet? N   In the past six months, have you accidently leaked urine? N   Do you have problems with loss of bowel control? N   Managing your Medications? N   Managing your Finances? N     Patient Care Team: Jerrol Banana., MD as PCP - General (Family Medicine) Bary Castilla, Forest Gleason, MD (General Surgery) Isaias Cowman, MD as Consulting Physician (Cardiology) Marry Guan Laurice Record, MD (Orthopedic Surgery) Phil Dopp, MD (Ophthalmology) Mettu, Leandro Reasoner, MD as Referring Physician (Ophthalmology) Byrd Hesselbach, MD as Referring Physician (Ophthalmology)  Indicate any recent Medical Services you may have received from other than [facility] providers in the past year (date may be approximate).     Assessment:   This is a routine wellness examination for [place].  Hearing/Vision screen No results found.  Dietary issues and exercise activities discussed:     Goals Addressed   None    Depression Screen    03/01/2022    1:14 PM 02/02/2022    9:54 AM 11/14/2021    4:06 PM 10/25/2021    1:31 PM 01/31/2021    1:21 PM 06/29/2020    2:46 PM 02/07/2019    1:37 PM  PHQ 2/9 Scores  PHQ - 2 Score 0 '2 1 2 1 '$ 0 1  PHQ- 9 Score 0 '6 4 7 2      '$ Fall Risk    03/29/2022    9:36 AM 03/01/2022    1:13 PM 10/28/2021    2:46 PM 01/31/2021    1:21 PM 06/29/2020    2:51 PM  [place] in the past year? 0 0 0 0 0  Number falls in past yr:  0 0 0 0  Injury with Fall? 0 0  0 0  Risk for fall due to :  No Fall Risks  Follow up  Falls evaluation completed  Falls evaluation completed     FALL RISK PREVENTION PERTAINING TO THE HOME:  Any stairs in or around the home? Yes  If so, are there any without handrails? No  Home free of loose throw rugs in walkways, pet beds, electrical cords, etc? Yes  Adequate lighting in your home to reduce risk of falls? Yes   ASSISTIVE DEVICES UTILIZED TO PREVENT FALLS:  Life alert? No  Use of a cane, walker or w/c? No  Grab bars in the  bathroom? Yes  Shower chair or bench in shower? Yes  Elevated toilet seat or a handicapped toilet? No   TIMED UP AND GO:  Was the test performed? Yes .  Length of time to ambulate 10 feet: 4 sec.   Gait steady and fast without use of assistive device  Cognitive Function:        06/16/2019   10:37 AM 12/12/2016    1:24 PM  6CIT Screen  What Year? 0 points 0 points  What month? 0 points 0 points  What time? 0 points 0 points  Count back from 20 0 points 0 points  Months in reverse 0 points 0 points  Repeat phrase 2 points 2 points  Total Score 2 points 2 points    Immunizations Immunization History  Administered Date(s) Administered   Fluad Quad(high Dose 65+) 09/25/2019   Influenza-Unspecified 10/05/2011, 09/25/2013, 10/25/2017, 09/24/2018, 09/25/2019, 09/14/2020   PFIZER(Purple Top)SARS-COV-2 Vaccination 12/30/2019, 01/20/2020, 09/20/2020   Pneumococcal Conjugate-13 12/12/2016   Pneumococcal Polysaccharide-23 08/10/1998, 02/07/2019   Tdap 05/21/2012   Zoster Recombinat (Shingrix) 08/13/2018, 11/07/2018   Zoster, Live 12/12/2007    TDAP status: Up to date  Flu Vaccine status: Up to date  Pneumococcal vaccine status: Up to date  Covid-19 vaccine status: Completed vaccines  Qualifies for Shingles Vaccine? Yes   Zostavax completed Yes   Shingrix Completed?: Yes  Screening Tests Health Maintenance  Topic Date Due   COVID-19 Vaccine (4 - Booster for Pfizer series) 11/15/2020   TETANUS/TDAP  05/21/2022   INFLUENZA VACCINE  07/25/2022   Pneumonia Vaccine 4+ Years old  Completed   Zoster Vaccines- Shingrix  Completed   HPV VACCINES  Aged Out    Health Maintenance  Health Maintenance Due  Topic Date Due   COVID-19 Vaccine (4 - Booster for Pfizer series) 11/15/2020    Colorectal cancer screening: No longer required.   Lung Cancer Screening: (Low Dose CT Chest recommended if Age 59-80 years, 30 pack-year currently smoking OR have quit w/in 15years.) does  not qualify.    Additional Screening:  Hepatitis C Screening: does not qualify; Completed no  Vision Screening: Recommended annual ophthalmology exams for early detection of glaucoma and other disorders of the eye. Is the patient up to date with their annual eye exam?  Yes  Who is the provider or what is the name of the office in which the patient attends annual eye exams? [facility] If pt is not established with a provider, would they like to be referred to a provider to establish care? No .   Dental Screening: Recommended annual dental exams for proper oral hygiene  Community Resource Referral / Chronic Care Management: CRR required this visit?  No   CCM required this visit?  No      Plan:     I have personally reviewed and noted the following in the patient's chart:   Medical and social history Use of alcohol, tobacco or illicit  drugs  Current medications and supplements including opioid prescriptions. Patient is not currently taking opioid prescriptions. Functional ability and status Nutritional status Physical activity Advanced directives List of other physicians Hospitalizations, surgeries, and ER visits in previous 12 months Vitals Screenings to include cognitive, depression, and falls Referrals and appointments  In addition, I have reviewed and discussed with patient certain preventive protocols, quality metrics, and best practice recommendations. A written personalized care plan for preventive services as well as general preventive health recommendations were provided to patient.     Dionisio David, LPN   2/70/3500   Nurse Notes: none

## 2022-04-05 NOTE — Patient Instructions (Signed)
Gary Mueller , ?Thank you for taking time to come for your Medicare Wellness Visit. I appreciate your ongoing commitment to your health goals. Please review the following plan we discussed and let me know if I can assist you in the future.  ? ?Screening recommendations/referrals: ?Colonoscopy: aged out ?Recommended yearly ophthalmology/optometry visit for glaucoma screening and checkup ?Recommended yearly dental visit for hygiene and checkup ? ?Vaccinations: ?Influenza vaccine: states had this season ?Pneumococcal vaccine: 02/07/19 ?Tdap vaccine: 05/21/12 ?Shingles vaccine: Shingrix 08/13/18, 11/07/18   ?Covid-19: 12/30/19, 01/20/20, 09/20/20, 04/11/21 ? ?Advanced directives: yes ? ?Conditions/risks identified: none ? ?Next appointment: Follow up in one year for your annual wellness visit. 04/10/23 @ 1pm in person ?Preventive Care 51 Years and Older, Male ?Preventive care refers to lifestyle choices and visits with your health care provider that can promote health and wellness. ?What does preventive care include? ?A yearly physical exam. This is also called an annual well check. ?Dental exams once or twice a year. ?Routine eye exams. Ask your health care provider how often you should have your eyes checked. ?Personal lifestyle choices, including: ?Daily care of your teeth and gums. ?Regular physical activity. ?Eating a healthy diet. ?Avoiding tobacco and drug use. ?Limiting alcohol use. ?Practicing safe sex. ?Taking low doses of aspirin every day. ?Taking vitamin and mineral supplements as recommended by your health care provider. ?What happens during an annual well check? ?The services and screenings done by your health care provider during your annual well check will depend on your age, overall health, lifestyle risk factors, and family history of disease. ?Counseling  ?Your health care provider may ask you questions about your: ?Alcohol use. ?Tobacco use. ?Drug use. ?Emotional well-being. ?Home and relationship  well-being. ?Sexual activity. ?Eating habits. ?History of falls. ?Memory and ability to understand (cognition). ?Work and work Statistician. ?Screening  ?You may have the following tests or measurements: ?Height, weight, and BMI. ?Blood pressure. ?Lipid and cholesterol levels. These may be checked every 5 years, or more frequently if you are over 64 years old. ?Skin check. ?Lung cancer screening. You may have this screening every year starting at age 71 if you have a 30-pack-year history of smoking and currently smoke or have quit within the past 15 years. ?Fecal occult blood test (FOBT) of the stool. You may have this test every year starting at age 23. ?Flexible sigmoidoscopy or colonoscopy. You may have a sigmoidoscopy every 5 years or a colonoscopy every 10 years starting at age 15. ?Prostate cancer screening. Recommendations will vary depending on your family history and other risks. ?Hepatitis C blood test. ?Hepatitis B blood test. ?Sexually transmitted disease (STD) testing. ?Diabetes screening. This is done by checking your blood sugar (glucose) after you have not eaten for a while (fasting). You may have this done every 1-3 years. ?Abdominal aortic aneurysm (AAA) screening. You may need this if you are a current or former smoker. ?Osteoporosis. You may be screened starting at age 52 if you are at high risk. ?Talk with your health care provider about your test results, treatment options, and if necessary, the need for more tests. ?Vaccines  ?Your health care provider may recommend certain vaccines, such as: ?Influenza vaccine. This is recommended every year. ?Tetanus, diphtheria, and acellular pertussis (Tdap, Td) vaccine. You may need a Td booster every 10 years. ?Zoster vaccine. You may need this after age 28. ?Pneumococcal 13-valent conjugate (PCV13) vaccine. One dose is recommended after age 83. ?Pneumococcal polysaccharide (PPSV23) vaccine. One dose is recommended after age 58. ?  Talk to your health care  provider about which screenings and vaccines you need and how often you need them. ?This information is not intended to replace advice given to you by your health care provider. Make sure you discuss any questions you have with your health care provider. ?Document Released: 01/07/2016 Document Revised: 08/30/2016 Document Reviewed: 10/12/2015 ?Elsevier Interactive Patient Education ? 2017 [place]. ? ?Fall Prevention in the Home ?Falls can cause injuries. They can happen to people of all ages. There are many things you can do to make your home safe and to help prevent falls. ?What can I do on the outside of my home? ?Regularly fix the edges of walkways and driveways and fix any cracks. ?Remove anything that might make you trip as you walk through a door, such as a raised step or threshold. ?Trim any bushes or trees on the path to your home. ?Use bright outdoor lighting. ?Clear any walking paths of anything that might make someone trip, such as rocks or tools. ?Regularly check to see if handrails are loose or broken. Make sure that both sides of any steps have handrails. ?Any raised decks and porches should have guardrails on the edges. ?Have any leaves, snow, or ice cleared regularly. ?Use sand or salt on walking paths during winter. ?Clean up any spills in your garage right away. This includes oil or grease spills. ?What can I do in the bathroom? ?Use night lights. ?Install grab bars by the toilet and in the tub and shower. Do not use towel bars as grab bars. ?Use non-skid mats or decals in the tub or shower. ?If you need to sit down in the shower, use a plastic, non-slip stool. ?Keep the floor dry. Clean up any water that spills on the floor as soon as it happens. ?Remove soap buildup in the tub or shower regularly. ?Attach bath mats securely with double-sided non-slip rug tape. ?Do not have throw rugs and other things on the floor that can make you trip. ?What can I do in the bedroom? ?Use night lights. ?Make  sure that you have a light by your bed that is easy to reach. ?Do not use any sheets or blankets that are too big for your bed. They should not hang down onto the floor. ?Have a firm chair that has side arms. You can use this for support while you get dressed. ?Do not have throw rugs and other things on the floor that can make you trip. ?What can I do in the kitchen? ?Clean up any spills right away. ?Avoid walking on wet floors. ?Keep items that you use a lot in easy-to-reach places. ?If you need to reach something above you, use a strong step stool that has a grab bar. ?Keep electrical cords out of the way. ?Do not use floor polish or wax that makes floors slippery. If you must use wax, use non-skid floor wax. ?Do not have throw rugs and other things on the floor that can make you trip. ?What can I do with my stairs? ?Do not leave any items on the stairs. ?Make sure that there are handrails on both sides of the stairs and use them. Fix handrails that are broken or loose. Make sure that handrails are as long as the stairways. ?Check any carpeting to make sure that it is firmly attached to the stairs. Fix any carpet that is loose or worn. ?Avoid having throw rugs at the top or bottom of the stairs. If you do have throw  rugs, attach them to the floor with carpet tape. ?Make sure that you have a light switch at the top of the stairs and the bottom of the stairs. If you do not have them, ask someone to add them for you. ?What else can I do to help prevent falls? ?Wear shoes that: ?Do not have high heels. ?Have rubber bottoms. ?Are comfortable and fit you well. ?Are closed at the toe. Do not wear sandals. ?If you use a stepladder: ?Make sure that it is fully opened. Do not climb a closed stepladder. ?Make sure that both sides of the stepladder are locked into place. ?Ask someone to hold it for you, if possible. ?Clearly mark and make sure that you can see: ?Any grab bars or handrails. ?First and last steps. ?Where the  edge of each step is. ?Use tools that help you move around (mobility aids) if they are needed. These include: ?Canes. ?Walkers. ?Scooters. ?Crutches. ?Turn on the lights when you go into a dark area. Replace any l

## 2022-04-12 DIAGNOSIS — Z85828 Personal history of other malignant neoplasm of skin: Secondary | ICD-10-CM | POA: Diagnosis not present

## 2022-04-15 ENCOUNTER — Encounter: Payer: Self-pay | Admitting: Family Medicine

## 2022-04-17 ENCOUNTER — Other Ambulatory Visit: Payer: Self-pay | Admitting: Family Medicine

## 2022-04-17 MED ORDER — ACYCLOVIR 5 % EX OINT: 1.0000 "application " | TOPICAL_OINTMENT | CUTANEOUS | 5 refills | Status: DC

## 2022-04-25 ENCOUNTER — Other Ambulatory Visit: Payer: Self-pay | Admitting: *Deleted

## 2022-04-25 DIAGNOSIS — B001 Herpesviral vesicular dermatitis: Secondary | ICD-10-CM

## 2022-04-25 MED ORDER — ACYCLOVIR 5 % EX OINT: 1.0000 "application " | TOPICAL_OINTMENT | CUTANEOUS | 5 refills | Status: DC

## 2022-05-01 DIAGNOSIS — H353231 Exudative age-related macular degeneration, bilateral, with active choroidal neovascularization: Secondary | ICD-10-CM | POA: Diagnosis not present

## 2022-05-11 DIAGNOSIS — M159 Polyosteoarthritis, unspecified: Secondary | ICD-10-CM | POA: Diagnosis not present

## 2022-05-11 DIAGNOSIS — M1380 Other specified arthritis, unspecified site: Secondary | ICD-10-CM | POA: Diagnosis not present

## 2022-05-11 DIAGNOSIS — M25841 Other specified joint disorders, right hand: Secondary | ICD-10-CM | POA: Diagnosis not present

## 2022-05-11 DIAGNOSIS — Z7960 Long term (current) use of unspecified immunomodulators and immunosuppressants: Secondary | ICD-10-CM | POA: Diagnosis not present

## 2022-05-15 ENCOUNTER — Other Ambulatory Visit: Payer: Self-pay | Admitting: Family Medicine

## 2022-05-15 DIAGNOSIS — I10 Essential (primary) hypertension: Secondary | ICD-10-CM

## 2022-05-29 DIAGNOSIS — H35352 Cystoid macular degeneration, left eye: Secondary | ICD-10-CM | POA: Diagnosis not present

## 2022-05-29 DIAGNOSIS — H02402 Unspecified ptosis of left eyelid: Secondary | ICD-10-CM | POA: Diagnosis not present

## 2022-05-29 DIAGNOSIS — H353231 Exudative age-related macular degeneration, bilateral, with active choroidal neovascularization: Secondary | ICD-10-CM | POA: Diagnosis not present

## 2022-05-29 DIAGNOSIS — Z961 Presence of intraocular lens: Secondary | ICD-10-CM | POA: Diagnosis not present

## 2022-05-29 DIAGNOSIS — H353132 Nonexudative age-related macular degeneration, bilateral, intermediate dry stage: Secondary | ICD-10-CM | POA: Diagnosis not present

## 2022-06-03 IMAGING — CR DG CHEST 2V
2 series · 2 of 2 positions shown · non-contrast
Comparison: 01/17/2017

CLINICAL DATA: Chest pain

EXAM:
CHEST - 2 VIEW

[chest pa]
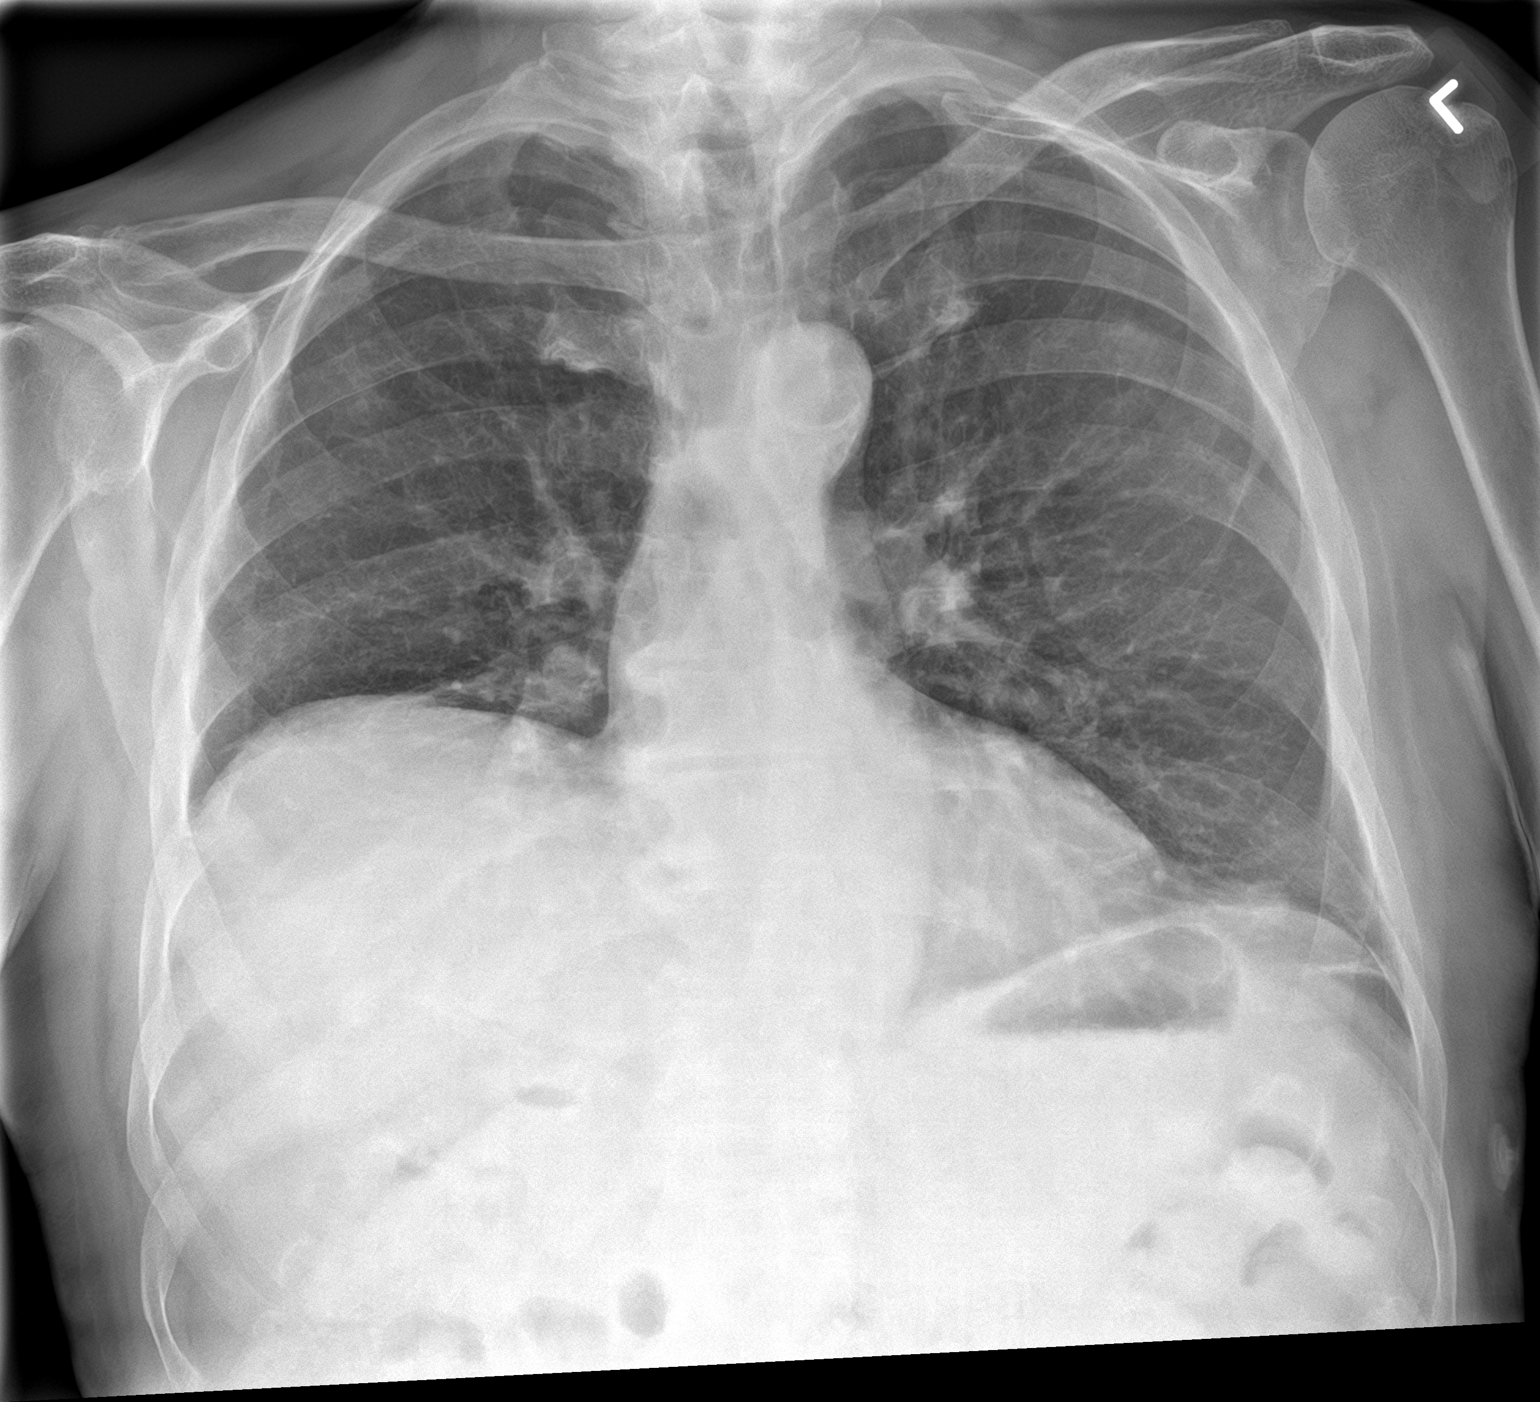

[chest lat]
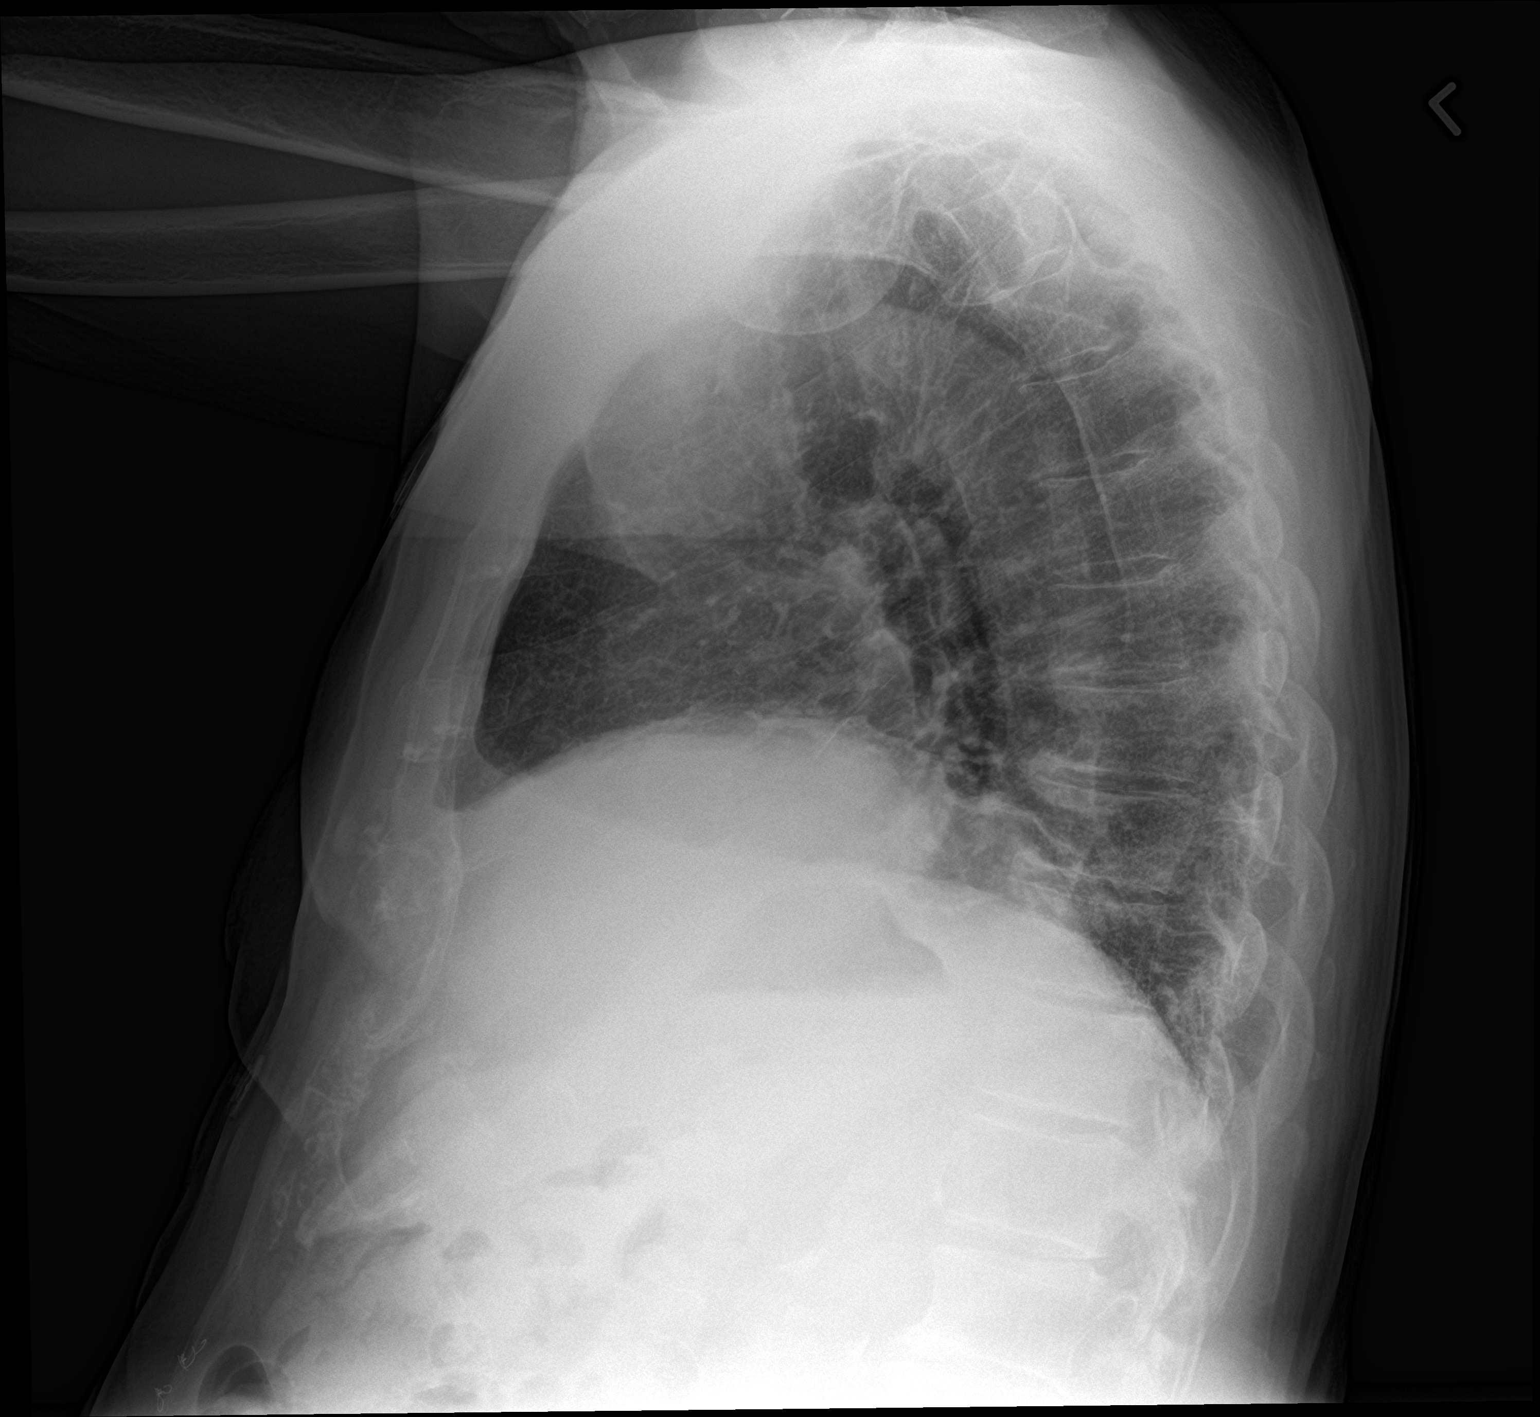

[2 of 2 positions shown; findings below may reference images not displayed]

FINDINGS: Mild elevation of the right hemidiaphragm. Bibasilar scarring,
stable. Heart is normal size. No effusions or acute bony
abnormality.
IMPRESSION: Bibasilar scarring. Mild elevation of the right hemidiaphragm. No
active disease.

## 2022-06-05 DIAGNOSIS — M79644 Pain in right finger(s): Secondary | ICD-10-CM | POA: Diagnosis not present

## 2022-06-05 DIAGNOSIS — M13841 Other specified arthritis, right hand: Secondary | ICD-10-CM | POA: Diagnosis not present

## 2022-06-13 DIAGNOSIS — I493 Ventricular premature depolarization: Secondary | ICD-10-CM | POA: Diagnosis not present

## 2022-06-13 DIAGNOSIS — R0602 Shortness of breath: Secondary | ICD-10-CM | POA: Diagnosis not present

## 2022-06-13 DIAGNOSIS — I10 Essential (primary) hypertension: Secondary | ICD-10-CM | POA: Diagnosis not present

## 2022-06-13 DIAGNOSIS — I2510 Atherosclerotic heart disease of native coronary artery without angina pectoris: Secondary | ICD-10-CM | POA: Diagnosis not present

## 2022-06-29 ENCOUNTER — Encounter: Payer: Self-pay | Admitting: Family Medicine

## 2022-07-10 DIAGNOSIS — H353231 Exudative age-related macular degeneration, bilateral, with active choroidal neovascularization: Secondary | ICD-10-CM | POA: Diagnosis not present

## 2022-07-17 ENCOUNTER — Ambulatory Visit: Payer: Medicare Other | Admitting: Family Medicine

## 2022-07-20 ENCOUNTER — Ambulatory Visit (INDEPENDENT_AMBULATORY_CARE_PROVIDER_SITE_OTHER): Payer: Medicare Other | Admitting: Family Medicine

## 2022-07-20 VITALS — BP 113/67 | HR 51 | Temp 97.9°F | Wt 169.0 lb

## 2022-07-20 DIAGNOSIS — I10 Essential (primary) hypertension: Secondary | ICD-10-CM | POA: Diagnosis not present

## 2022-07-20 DIAGNOSIS — E785 Hyperlipidemia, unspecified: Secondary | ICD-10-CM

## 2022-07-20 DIAGNOSIS — I2510 Atherosclerotic heart disease of native coronary artery without angina pectoris: Secondary | ICD-10-CM

## 2022-07-20 DIAGNOSIS — R5383 Other fatigue: Secondary | ICD-10-CM | POA: Diagnosis not present

## 2022-07-20 DIAGNOSIS — M791 Myalgia, unspecified site: Secondary | ICD-10-CM

## 2022-07-20 DIAGNOSIS — E039 Hypothyroidism, unspecified: Secondary | ICD-10-CM | POA: Diagnosis not present

## 2022-07-20 NOTE — Progress Notes (Signed)
11/67   I,Elena D DeSanto,acting as a scribe for Wilhemena Durie, MD.,have documented all relevant documentation on the behalf of Wilhemena Durie, MD,as directed by  Wilhemena Durie, MD while in the presence of Wilhemena Durie, MD.    Established patient visit   Patient: Gary Mueller   DOB: Dec 23, 1941   81 y.o. Male  MRN: [id number] Visit Date: 07/20/2022  Today's healthcare provider: Wilhemena Durie, MD   No chief complaint on file.  Subjective    HPI  Patient comes in today for follow-up.  He is enjoying retirement but admits that he is felt weak and feels as though he is losing muscle mass.  Cardiology stopped his rosuvastatin for a few weeks earlier this summer and he felt much better.  He is back on it now.  He is having no cardiac symptoms.  Hypertension, follow-up  BP Readings from Last 3 Encounters:  07/20/22 113/67  04/05/22 110/60  03/01/22 131/69   Wt Readings from Last 3 Encounters:  07/20/22 169 lb (76.7 kg)  04/05/22 166 lb 9.6 oz (75.6 kg)  03/01/22 169 lb (76.7 kg)     He was last seen for hypertension 4 months ago.  BP at that visit was as above. Management since that visit includes none.   Outside blood pressures are not being checked Symptoms: No chest pain No chest pressure  No palpitations No syncope  No dyspnea No orthopnea  No paroxysmal nocturnal dyspnea No lower extremity edema   Pertinent labs Lab Results  Component Value Date   CHOL 154 10/26/2021   HDL 38 (L) 10/26/2021   LDLCALC 99 10/26/2021   TRIG 89 10/26/2021   CHOLHDL 5.0 02/01/2021   Lab Results  Component Value Date   NA 141 10/26/2021   K 4.7 10/26/2021   CREATININE 1.07 10/26/2021   EGFR 69 10/26/2021   GLUCOSE 104 (H) 10/26/2021   TSH 1.630 10/26/2021     The ASCVD Risk score (Arnett DK, et al., 2019) failed to calculate for the following reasons:   The 2019 ASCVD risk score is only valid for ages 76 to 46   The patient has a prior MI or stroke  diagnosis  ---------------------------------------------------------------------------------------------------   Medications: Outpatient Medications Prior to Visit  Medication Sig   acetaminophen (TYLENOL) 500 MG tablet Take 1,000 mg by mouth daily as needed for moderate pain.   amLODipine (NORVASC) 5 MG tablet TAKE 1 TABLET BY MOUTH DAILY   ascorbic acid (VITAMIN C) 1000 MG tablet Take 1 tablet by mouth daily.   aspirin EC 81 MG EC tablet Take 1 tablet (81 mg total) by mouth daily. Swallow whole.   clopidogrel (PLAVIX) 75 MG tablet Take 75 mg by mouth daily.   fluticasone (FLONASE) 50 MCG/ACT nasal spray Place 2 sprays into both nostrils daily.   folic acid (FOLVITE) 1 MG tablet Take 1 mg by mouth daily.   levothyroxine (SYNTHROID) 100 MCG tablet TAKE 1 TABLET EVERY DAY ON EMPTY STOMACHWITH A GLASS OF WATER AT LEAST 30-60 MINBEFORE BREAKFAST   losartan (COZAAR) 50 MG tablet TAKE ONE TABLET BY MOUTH EVERY DAY   methotrexate 2.5 MG tablet Take 15 mg by mouth every Wednesday.   metoprolol succinate (TOPROL-XL) 25 MG 24 hr tablet TAKE 1/2 TABLET BY MOUTH EVERY EVENING   Multiple Vitamin (MULTIVITAMIN WITH MINERALS) TABS tablet Take 1 tablet by mouth every evening.    nitroGLYCERIN (NITROSTAT) 0.4 MG SL tablet Place 1 tablet (0.4 mg total) under  the tongue every 5 (five) minutes as needed for chest pain.   pantoprazole (PROTONIX) 40 MG tablet TAKE ONE TABLET (40 MG) BY MOUTH EVERY DAY   rosuvastatin (CRESTOR) 40 MG tablet TAKE ONE TABLET BY MOUTH EVERY DAY   ticagrelor (BRILINTA) 90 MG TABS tablet Take 1 tablet (90 mg total) by mouth 2 (two) times daily.   vitamin E 180 MG (400 UNITS) capsule Take 1 capsule by mouth daily.   acyclovir ointment (ZOVIRAX) 5 % Apply 1 application. topically every 3 (three) hours.   No facility-administered medications prior to visit.    Review of Systems  Last thyroid functions Lab Results  Component Value Date   TSH 1.630 10/26/2021       Objective     BP 113/67 (BP Location: Right Arm, Patient Position: Sitting, Cuff Size: Normal)   Pulse (!) 51   Temp 97.9 F (36.6 C) (Oral)   Wt 169 lb (76.7 kg)   SpO2 98%   BMI 22.92 kg/m  BP Readings from Last 3 Encounters:  07/20/22 113/67  04/05/22 110/60  03/01/22 131/69   Wt Readings from Last 3 Encounters:  07/20/22 169 lb (76.7 kg)  04/05/22 166 lb 9.6 oz (75.6 kg)  03/01/22 169 lb (76.7 kg)      Physical Exam Vitals reviewed.  Constitutional:      General: He is not in acute distress.    Appearance: He is well-developed.  HENT:     Head: Normocephalic and atraumatic.     Right Ear: Hearing normal.     Left Ear: Hearing normal.     Nose: Nose normal.  Eyes:     General: Lids are normal. No scleral icterus.       Right eye: No discharge.        Left eye: No discharge.     Conjunctiva/sclera: Conjunctivae normal.  Cardiovascular:     Rate and Rhythm: Normal rate and regular rhythm.     Heart sounds: Normal heart sounds.  Pulmonary:     Effort: Pulmonary effort is normal. No respiratory distress.  Skin:    Findings: No lesion or rash.  Neurological:     General: No focal deficit present.     Mental Status: He is alert and oriented to person, place, and time.     Motor: Weakness: .diagmed.  Psychiatric:        Mood and Affect: Mood normal.        Speech: Speech normal.        Behavior: Behavior normal.        Thought Content: Thought content normal.        Judgment: Judgment normal.       No results found for any visits on 07/20/22.  Assessment & Plan     1. Essential hypertension Good control. - CBC with Differential/Platelet - Comprehensive metabolic panel - Lipid Panel With LDL/HDL Ratio - TSH - CK  2. Adult hypothyroidism TSH to euthyroid - CBC with Differential/Platelet - Comprehensive metabolic panel - Lipid Panel With LDL/HDL Ratio - TSH - CK  3. Hyperlipidemia, unspecified hyperlipidemia type At this time due to potential side effects  stop his statin and before next visit we will obtain acute complete lab work and see him back in a month.  If his symptoms are much better will consider going to [facility] or just following LDL over time in this 81 year old - CBC with Differential/Platelet - Comprehensive metabolic panel - Lipid Panel With LDL/HDL Ratio - TSH -  CK  4. Other fatigue As above, stop statin - CBC with Differential/Platelet - Comprehensive metabolic panel - Lipid Panel With LDL/HDL Ratio - TSH - CK  5. Myalgia  - CBC with Differential/Platelet - Comprehensive metabolic panel - Lipid Panel With LDL/HDL Ratio - TSH - CK 6.  CAD  No follow-ups on file.      I, Wilhemena Durie, MD, have reviewed all documentation for this visit. The documentation on 07/20/22 for the exam, diagnosis, procedures, and orders are all accurate and complete.    Lu Paradise Cranford Mon, MD  [facility] [phone] (phone) [phone] (fax)  [place]

## 2022-07-31 DIAGNOSIS — H353232 Exudative age-related macular degeneration, bilateral, with inactive choroidal neovascularization: Secondary | ICD-10-CM | POA: Diagnosis not present

## 2022-07-31 DIAGNOSIS — H353231 Exudative age-related macular degeneration, bilateral, with active choroidal neovascularization: Secondary | ICD-10-CM | POA: Diagnosis not present

## 2022-07-31 DIAGNOSIS — Z961 Presence of intraocular lens: Secondary | ICD-10-CM | POA: Diagnosis not present

## 2022-08-06 ENCOUNTER — Other Ambulatory Visit: Payer: Self-pay | Admitting: Family Medicine

## 2022-08-09 ENCOUNTER — Ambulatory Visit: Payer: Self-pay | Admitting: *Deleted

## 2022-08-09 DIAGNOSIS — R5383 Other fatigue: Secondary | ICD-10-CM | POA: Diagnosis not present

## 2022-08-09 DIAGNOSIS — E039 Hypothyroidism, unspecified: Secondary | ICD-10-CM | POA: Diagnosis not present

## 2022-08-09 DIAGNOSIS — I10 Essential (primary) hypertension: Secondary | ICD-10-CM | POA: Diagnosis not present

## 2022-08-09 DIAGNOSIS — E785 Hyperlipidemia, unspecified: Secondary | ICD-10-CM | POA: Diagnosis not present

## 2022-08-09 DIAGNOSIS — M791 Myalgia, unspecified site: Secondary | ICD-10-CM | POA: Diagnosis not present

## 2022-08-09 NOTE — Patient Outreach (Signed)
  Care Coordination   08/09/2022 Name: Gary Mueller MRN: [id number] DOB: 1941-03-07   Care Coordination Outreach Attempts:  An unsuccessful telephone outreach was attempted today to offer the patient information about available care coordination services as a benefit of their health plan.   Follow Up Plan:  Additional outreach attempts will be made to offer the patient care coordination information and services.   Encounter Outcome:  No Answer  Care Coordination Interventions Activated:  No   Care Coordination Interventions:  No, not indicated    Caleigh Rabelo, [place] Worker  [facility] Care Management [phone]

## 2022-08-09 NOTE — Patient Outreach (Signed)
  Care Coordination   Initial Visit Note   08/09/2022 Name: Gary Mueller MRN: [id number] DOB: 10-05-1941  Gary Mueller is a 81 y.o. year old male who sees Gary Mueller., MD for primary care. I spoke with  Gary Mueller by phone today  What matters to the patients health and wellness today?  Patient verbalized having no nursing or community resource needs at this time    Goals Addressed             This Visit's Progress    care coordination activities       Care Coordination Interventions: Case Management program offered and discussed SDOH screening completed Confirmed follow up with PCP scheduled for  08/17/22 AWV previously scheduled for 04/10/23 Patient declined having any nursing or community resource needs at this time         SDOH assessments and interventions completed:  Yes  SDOH Interventions Today    Flowsheet Row Most Recent Value  SDOH Interventions   Housing Interventions Intervention Not Indicated  Physical Activity Interventions Intervention Not Indicated  Transportation Interventions Intervention Not Indicated        Care Coordination Interventions Activated:  Yes  Care Coordination Interventions:  Yes, provided   Follow up plan: No further intervention required.   Encounter Outcome:  Pt. Visit Completed

## 2022-08-09 NOTE — Patient Instructions (Signed)
Visit Information  Thank you for taking time to visit with me today. Please don't hesitate to contact me if I can be of assistance to you.   Following are the goals we discussed today:   Goals Addressed             This Visit's Progress    care coordination activities       Care Coordination Interventions: Case Management program offered and discussed SDOH screening completed Confirmed follow up with PCP scheduled for  08/17/22 AWV previously scheduled for 04/10/23 Patient declined having any nursing or community resource needs at this time          Please call the care guide team at [phone] if you need to cancel or reschedule your appointment.   If you are experiencing a Mental Health or [place] or need someone to talk to, please call the Suicide and Crisis Lifeline: 988   Patient verbalizes understanding of instructions and care plan provided today and agrees to view in [place]. Active MyChart status and patient understanding of how to access instructions and care plan via MyChart confirmed with patient.     No further follow up at this time  [facility], [place] Worker  [facility] Care Management [phone]

## 2022-08-10 LAB — LIPID PANEL WITH LDL/HDL RATIO
Cholesterol, Total: 189 mg/dL (ref 100–199)
HDL: 36 mg/dL — ABNORMAL LOW (ref >39)
LDL Chol Calc (NIH): 129 mg/dL — ABNORMAL HIGH (ref 0–99)
LDL/HDL Ratio: 3.6 ratio (ref 0.0–3.6)
Triglycerides: 133 mg/dL (ref 0–149)
VLDL Cholesterol Cal: 24 mg/dL (ref 5–40)

## 2022-08-10 LAB — COMPREHENSIVE METABOLIC PANEL
ALT: 17 IU/L (ref 0–44)
AST: 17 IU/L (ref 0–40)
Albumin/Globulin Ratio: 2.3 — ABNORMAL HIGH (ref 1.2–2.2)
Albumin: 4.2 g/dL (ref 3.7–4.7)
Alkaline Phosphatase: 77 IU/L (ref 44–121)
BUN/Creatinine Ratio: 28 — ABNORMAL HIGH (ref 10–24)
BUN: 34 mg/dL — ABNORMAL HIGH (ref 8–27)
Bilirubin Total: 0.4 mg/dL (ref 0.0–1.2)
CO2: 21 mmol/L (ref 20–29)
Calcium: 9.5 mg/dL (ref 8.6–10.2)
Chloride: 106 mmol/L (ref 96–106)
Creatinine, Ser: 1.2 mg/dL (ref 0.76–1.27)
Globulin, Total: 1.8 g/dL (ref 1.5–4.5)
Glucose: 101 mg/dL — ABNORMAL HIGH (ref 70–99)
Potassium: 5.4 mmol/L — ABNORMAL HIGH (ref 3.5–5.2)
Sodium: 141 mmol/L (ref 134–144)
Total Protein: 6 g/dL (ref 6.0–8.5)
eGFR: 60 mL/min/{1.73_m2} (ref >59)

## 2022-08-10 LAB — CBC WITH DIFFERENTIAL/PLATELET
Basophils Absolute: 0 10*3/uL (ref 0.0–0.2)
Basos: 0 %
EOS (ABSOLUTE): 0.1 10*3/uL (ref 0.0–0.4)
Eos: 2 %
Hematocrit: 33.3 % — ABNORMAL LOW (ref 37.5–51.0)
Hemoglobin: 11.2 g/dL — ABNORMAL LOW (ref 13.0–17.7)
Immature Grans (Abs): 0 10*3/uL (ref 0.0–0.1)
Immature Granulocytes: 0 %
Lymphocytes Absolute: 1.8 10*3/uL (ref 0.7–3.1)
Lymphs: 35 %
MCH: 32.7 pg (ref 26.6–33.0)
MCHC: 33.6 g/dL (ref 31.5–35.7)
MCV: 97 fL (ref 79–97)
Monocytes Absolute: 0.5 10*3/uL (ref 0.1–0.9)
Monocytes: 9 %
Neutrophils Absolute: 2.9 10*3/uL (ref 1.4–7.0)
Neutrophils: 54 %
Platelets: 181 10*3/uL (ref 150–450)
RBC: 3.43 x10E6/uL — ABNORMAL LOW (ref 4.14–5.80)
RDW: 14.2 % (ref 11.6–15.4)
WBC: 5.3 10*3/uL (ref 3.4–10.8)

## 2022-08-10 LAB — CK: Total CK: 156 U/L (ref 30–208)

## 2022-08-10 LAB — TSH: TSH: 4.98 u[IU]/mL — ABNORMAL HIGH (ref 0.450–4.500)

## 2022-08-17 ENCOUNTER — Ambulatory Visit: Payer: Self-pay

## 2022-08-17 ENCOUNTER — Ambulatory Visit (INDEPENDENT_AMBULATORY_CARE_PROVIDER_SITE_OTHER): Payer: Medicare Other | Admitting: Family Medicine

## 2022-08-17 ENCOUNTER — Encounter: Payer: Self-pay | Admitting: Family Medicine

## 2022-08-17 VITALS — BP 110/55 | HR 55 | Temp 97.9°F | Resp 16 | Wt 168.3 lb

## 2022-08-17 DIAGNOSIS — E039 Hypothyroidism, unspecified: Secondary | ICD-10-CM

## 2022-08-17 DIAGNOSIS — I2510 Atherosclerotic heart disease of native coronary artery without angina pectoris: Secondary | ICD-10-CM

## 2022-08-17 DIAGNOSIS — E785 Hyperlipidemia, unspecified: Secondary | ICD-10-CM

## 2022-08-17 DIAGNOSIS — R1013 Epigastric pain: Secondary | ICD-10-CM | POA: Diagnosis not present

## 2022-08-17 DIAGNOSIS — Z79899 Other long term (current) drug therapy: Secondary | ICD-10-CM

## 2022-08-17 DIAGNOSIS — R5383 Other fatigue: Secondary | ICD-10-CM | POA: Diagnosis not present

## 2022-08-17 DIAGNOSIS — I10 Essential (primary) hypertension: Secondary | ICD-10-CM

## 2022-08-17 MED ORDER — LEVOTHYROXINE SODIUM 112 MCG PO TABS: 112.0000 ug | ORAL_TABLET | Freq: Every day | ORAL | 3 refills | Status: DC

## 2022-08-17 NOTE — Patient Instructions (Signed)
Restart pantoprazole daily Try taking rosuvastatin twice a week. Stop the Synthroid 100 mcg, we will send in 112 mcg dose

## 2022-08-17 NOTE — Progress Notes (Signed)
I,Sulibeya S Dimas,acting as a scribe for Wilhemena Durie, MD.,have documented all relevant documentation on the behalf of Wilhemena Durie, MD,as directed by  Wilhemena Durie, MD while in the presence of Wilhemena Durie, MD.   Established patient visit   Patient: Gary Mueller   DOB: [birth year]   81 y.o. Male  MRN: [id number] Visit Date: 08/17/2022  Today's healthcare provider: Wilhemena Durie, MD   Chief Complaint  Patient presents with   Hyperlipidemia   Subjective    HPI  Patient has chronic fatigue but he states his muscle fatigue and soreness is improved after stopping rosuvastatin.  He complains of 2 weeks of abdominal pain which is more epigastric than anything.  He has no chest pain dyspnea on exertion.  Lipid/Cholesterol, Follow-up  Last lipid panel Other pertinent labs  Lab Results  Component Value Date   CHOL 189 08/09/2022   HDL 36 (L) 08/09/2022   LDLCALC 129 (H) 08/09/2022   TRIG 133 08/09/2022   CHOLHDL 5.0 02/01/2021   Lab Results  Component Value Date   ALT 17 08/09/2022   AST 17 08/09/2022   PLT 181 08/09/2022   TSH 4.980 (H) 08/09/2022     He was last seen for this 4 weeks ago.  Management since that visit includes; At this time due to potential side effects stop his statin and before next visit we will obtain acute complete lab work and see him back in a month.  If his symptoms are much better will consider going to [facility] or just following LDL over time in this 81 year old.  Patient reports symptoms have improved after stopping medication rosuvastatin.    The ASCVD Risk score (Arnett DK, et al., 2019) failed to calculate for the following reasons:   The 2019 ASCVD risk score is only valid for ages 40 to 49   The patient has a prior MI or stroke diagnosis  --------------------------------------------------------------------------------------------------- Patient C/O pain under left rib x a few weeks. Patient reports pain last  about 2-3 minutes. Patient reports shortness of breath with pain. Patient reports pain feels like a stabbing pain.   Patient C/O upper abdominal pain on and off x a few weeks. Patient denies any nausea voimting or diarrhea. Patient reports chronic constipation. Patient reports no pattern with pain. Patient reports he has not taken any medications for pain.   Patient reports leg pain and cramps only from the knee down. Patient reports pain is present on and off.    Medications: Outpatient Medications Prior to Visit  Medication Sig   acetaminophen (TYLENOL) 500 MG tablet Take 1,000 mg by mouth daily as needed for moderate pain.   amLODipine (NORVASC) 5 MG tablet TAKE 1 TABLET BY MOUTH DAILY   ascorbic acid (VITAMIN C) 1000 MG tablet Take 1 tablet by mouth daily.   aspirin EC 81 MG EC tablet Take 1 tablet (81 mg total) by mouth daily. Swallow whole.   clopidogrel (PLAVIX) 75 MG tablet Take 75 mg by mouth daily.   fluticasone (FLONASE) 50 MCG/ACT nasal spray Place 2 sprays into both nostrils daily.   folic acid (FOLVITE) 1 MG tablet Take 1 mg by mouth daily.   levothyroxine (SYNTHROID) 100 MCG tablet TAKE 1 TABLET EVERY DAY ON EMPTY STOMACHWITH A GLASS OF WATER AT LEAST 30-60 MINBEFORE BREAKFAST   losartan (COZAAR) 50 MG tablet TAKE ONE TABLET BY MOUTH EVERY DAY   methotrexate 2.5 MG tablet Take 15 mg by mouth every Wednesday.  metoprolol succinate (TOPROL-XL) 25 MG 24 hr tablet TAKE 1/2 TABLET BY MOUTH EVERY EVENING   Multiple Vitamin (MULTIVITAMIN WITH MINERALS) TABS tablet Take 1 tablet by mouth every evening.    nitroGLYCERIN (NITROSTAT) 0.4 MG SL tablet Place 1 tablet (0.4 mg total) under the tongue every 5 (five) minutes as needed for chest pain.   pantoprazole (PROTONIX) 40 MG tablet TAKE ONE TABLET (40 MG) BY MOUTH EVERY DAY   ticagrelor (BRILINTA) 90 MG TABS tablet Take 1 tablet (90 mg total) by mouth 2 (two) times daily.   vitamin E 180 MG (400 UNITS) capsule Take 1 capsule by mouth  daily.   rosuvastatin (CRESTOR) 40 MG tablet TAKE ONE TABLET BY MOUTH EVERY DAY (Patient not taking: Reported on 08/17/2022)   [DISCONTINUED] acyclovir ointment (ZOVIRAX) 5 % Apply 1 application. topically every 3 (three) hours.   No facility-administered medications prior to visit.    Review of Systems  Constitutional:  Positive for fatigue. Negative for appetite change, chills and fever.  Respiratory:  Positive for shortness of breath. Negative for chest tightness and wheezing.   Cardiovascular:  Positive for chest pain. Negative for palpitations and leg swelling.  Gastrointestinal:  Positive for abdominal pain and constipation. Negative for blood in stool, diarrhea, nausea and vomiting.  Genitourinary:  Positive for frequency. Negative for dysuria, hematuria and urgency.  Musculoskeletal:  Positive for myalgias.        Objective    BP (!) 110/55 (BP Location: Right Arm, Patient Position: Sitting, Cuff Size: Normal)   Pulse (!) 55   Temp 97.9 F (36.6 C) (Oral)   Resp 16   Wt 168 lb 4.8 oz (76.3 kg)   SpO2 97%   BMI 22.83 kg/m  BP Readings from Last 3 Encounters:  08/17/22 (!) 110/55  07/20/22 113/67  04/05/22 110/60   Wt Readings from Last 3 Encounters:  08/17/22 168 lb 4.8 oz (76.3 kg)  07/20/22 169 lb (76.7 kg)  04/05/22 166 lb 9.6 oz (75.6 kg)      Physical Exam Vitals reviewed.  Constitutional:      Appearance: Normal appearance. He is well-developed.  HENT:     Head: Normocephalic and atraumatic.     Right Ear: External ear normal.     Left Ear: External ear normal.     Nose: Nose normal.  Eyes:     General: No scleral icterus.    Conjunctiva/sclera: Conjunctivae normal.  Neck:     Thyroid: No thyromegaly.     Vascular: No carotid bruit.  Cardiovascular:     Rate and Rhythm: Normal rate and regular rhythm.     Pulses: Normal pulses.     Heart sounds: Normal heart sounds.  Pulmonary:     Effort: Pulmonary effort is normal.     Breath sounds: Normal  breath sounds.  Abdominal:     Palpations: Abdomen is soft.  Lymphadenopathy:     Cervical: No cervical adenopathy.  Skin:    General: Skin is warm and dry.     Capillary Refill: Capillary refill takes less than 2 seconds.  Neurological:     General: No focal deficit present.     Mental Status: He is alert and oriented to person, place, and time.  Psychiatric:        Mood and Affect: Mood normal.        Behavior: Behavior normal.        Thought Content: Thought content normal.        Judgment: Judgment  normal.       No results found for any visits on 08/17/22.  Assessment & Plan     1. CAD in native artery Risk Factors treated.  2. Hyperlipidemia, unspecified hyperlipidemia type Restart rosuvastatin twice a week as patient felt better off of it. - Comprehensive Metabolic Panel (CMET)  3. Fatigue, unspecified type  - CBC w/Diff/Platelet - Iron, TIBC and Ferritin Panel - B12 and Folate Panel  4. Epigastric pain Start pantoprazole again.  May need GI work-up. - CBC w/Diff/Platelet - Comprehensive Metabolic Panel (CMET)  5. Essential hypertension  - Comprehensive Metabolic Panel (CMET)  6. Adult hypothyroidism Synthroid 112 mcg dail  7. High risk medication use  - B12 and Folate Panel   No follow-ups on file.      I, Wilhemena Durie, MD, have reviewed all documentation for this visit. The documentation on 08/20/22 for the exam, diagnosis, procedures, and orders are all accurate and complete.    Marylynne Keelin Cranford Mon, MD  [facility] [phone] (phone) [phone] (fax)  [place]

## 2022-08-17 NOTE — Telephone Encounter (Signed)
Message from Luciana Axe sent at 08/17/2022  1:44 PM EDT  Summary: Medication Advice   Pt is calling to ask should he be taking these medications? ticagrelor (BRILINTA) 90 MG TABS tablet [[id number]] lisinopril (ZESTRIL) tablet 5 mg   [[id number]]?   ----- Message from Luciana Axe sent at 08/17/2022  1:44 PM EDT -----  Pt is calling to ask should he be taking these medications? ticagrelor (BRILINTA) 90 MG TABS tablet [[id number]] lisinopril (ZESTRIL) tablet 5 mg   [[id number]]?          Chief Complaint: pt called asking if he should be taking the ticagrelor and the lisinopril. Symptoms: n/a Frequency: n/a Pertinent Negatives: Patient denies n/a Disposition: '[]'$ ED /'[]'$ Urgent Care (no appt availability in office) / '[]'$ Appointment(In office/virtual)/ '[]'$  [facility] [facility]/ '[]'$ Home Care/ '[]'$ Refused Recommended Disposition /'[]'$ [place] [facility]/ '[x]'$  Follow-up with PCP Additional Notes: Pt advised that ticagrelor is still an active medication but last refilled 10/20/21. Prescriber was at hospital discharge by Dr Spero Geralds.  Pt advised the lisinopril was discontinued 01/30/22 Change in therapy. Advised pt will send message to provider. Please send to [facility].   Reason for Disposition  [1] Prescription refill request for ESSENTIAL medicine (i.e., likelihood of harm to patient if not taken) AND [2] triager unable to refill per department policy  Answer Assessment - Initial Assessment Questions 1. DRUG NAME: "What medicine do you need to have refilled?"     Brilinta 2. REFILLS REMAINING: "How many refills are remaining?" (Note: The label on the medicine or pill bottle will show how many refills are remaining. If there are no refills remaining, then a renewal may be needed.)     none 3. EXPIRATION DATE: "What is the expiration date?" (Note: The label states when the prescription will expire, and thus can no longer be refilled.)     N/a 4. PRESCRIBING HCP: "Who prescribed it?"  Reason: If prescribed by specialist, call should be referred to that group.     At hospital discharge (Dr. Spero Geralds)  Protocols used: Medication Refill and Renewal Call-A-AH

## 2022-08-18 LAB — COMPREHENSIVE METABOLIC PANEL
ALT: 13 IU/L (ref 0–44)
AST: 18 IU/L (ref 0–40)
Albumin/Globulin Ratio: 2.4 — ABNORMAL HIGH (ref 1.2–2.2)
Albumin: 4.4 g/dL (ref 3.7–4.7)
Alkaline Phosphatase: 77 IU/L (ref 44–121)
BUN/Creatinine Ratio: 23 (ref 10–24)
BUN: 30 mg/dL — ABNORMAL HIGH (ref 8–27)
Bilirubin Total: 0.5 mg/dL (ref 0.0–1.2)
CO2: 22 mmol/L (ref 20–29)
Calcium: 9.8 mg/dL (ref 8.6–10.2)
Chloride: 104 mmol/L (ref 96–106)
Creatinine, Ser: 1.3 mg/dL — ABNORMAL HIGH (ref 0.76–1.27)
Globulin, Total: 1.8 g/dL (ref 1.5–4.5)
Glucose: 100 mg/dL — ABNORMAL HIGH (ref 70–99)
Potassium: 4.9 mmol/L (ref 3.5–5.2)
Sodium: 140 mmol/L (ref 134–144)
Total Protein: 6.2 g/dL (ref 6.0–8.5)
eGFR: 55 mL/min/{1.73_m2} — ABNORMAL LOW (ref >59)

## 2022-08-18 LAB — CBC WITH DIFFERENTIAL/PLATELET
Basophils Absolute: 0 10*3/uL (ref 0.0–0.2)
Basos: 0 %
EOS (ABSOLUTE): 0.1 10*3/uL (ref 0.0–0.4)
Eos: 2 %
Hematocrit: 34.3 % — ABNORMAL LOW (ref 37.5–51.0)
Hemoglobin: 11.5 g/dL — ABNORMAL LOW (ref 13.0–17.7)
Immature Grans (Abs): 0 10*3/uL (ref 0.0–0.1)
Immature Granulocytes: 0 %
Lymphocytes Absolute: 1.7 10*3/uL (ref 0.7–3.1)
Lymphs: 31 %
MCH: 32.2 pg (ref 26.6–33.0)
MCHC: 33.5 g/dL (ref 31.5–35.7)
MCV: 96 fL (ref 79–97)
Monocytes Absolute: 0.4 10*3/uL (ref 0.1–0.9)
Monocytes: 8 %
Neutrophils Absolute: 3.2 10*3/uL (ref 1.4–7.0)
Neutrophils: 59 %
Platelets: 187 10*3/uL (ref 150–450)
RBC: 3.57 x10E6/uL — ABNORMAL LOW (ref 4.14–5.80)
RDW: 13.9 % (ref 11.6–15.4)
WBC: 5.4 10*3/uL (ref 3.4–10.8)

## 2022-08-18 LAB — IRON,TIBC AND FERRITIN PANEL
Ferritin: 171 ng/mL (ref 30–400)
Iron Saturation: 63 % — ABNORMAL HIGH (ref 15–55)
Iron: 196 ug/dL — ABNORMAL HIGH (ref 38–169)
Total Iron Binding Capacity: 309 ug/dL (ref 250–450)
UIBC: 113 ug/dL (ref 111–343)

## 2022-08-18 LAB — B12 AND FOLATE PANEL
Folate: >20 ng/mL (ref >3.0)
Vitamin B-12: 614 pg/mL (ref 232–1245)

## 2022-08-18 MED ORDER — TICAGRELOR 90 MG PO TABS: 90.0000 mg | ORAL_TABLET | Freq: Two times a day (BID) | ORAL | 11 refills | Status: DC

## 2022-08-21 DIAGNOSIS — H353232 Exudative age-related macular degeneration, bilateral, with inactive choroidal neovascularization: Secondary | ICD-10-CM | POA: Diagnosis not present

## 2022-08-29 DIAGNOSIS — M79641 Pain in right hand: Secondary | ICD-10-CM | POA: Diagnosis not present

## 2022-08-29 DIAGNOSIS — M79642 Pain in left hand: Secondary | ICD-10-CM | POA: Diagnosis not present

## 2022-08-29 DIAGNOSIS — M180 Bilateral primary osteoarthritis of first carpometacarpal joints: Secondary | ICD-10-CM | POA: Diagnosis not present

## 2022-08-31 ENCOUNTER — Ambulatory Visit (INDEPENDENT_AMBULATORY_CARE_PROVIDER_SITE_OTHER): Payer: Medicare Other | Admitting: Family Medicine

## 2022-08-31 ENCOUNTER — Encounter: Payer: Self-pay | Admitting: Family Medicine

## 2022-08-31 VITALS — BP 133/70 | HR 44 | Temp 97.5°F | Resp 12 | Wt 170.0 lb

## 2022-08-31 DIAGNOSIS — K219 Gastro-esophageal reflux disease without esophagitis: Secondary | ICD-10-CM

## 2022-08-31 DIAGNOSIS — I4891 Unspecified atrial fibrillation: Secondary | ICD-10-CM

## 2022-08-31 DIAGNOSIS — I2510 Atherosclerotic heart disease of native coronary artery without angina pectoris: Secondary | ICD-10-CM | POA: Diagnosis not present

## 2022-08-31 DIAGNOSIS — E039 Hypothyroidism, unspecified: Secondary | ICD-10-CM

## 2022-08-31 DIAGNOSIS — Z23 Encounter for immunization: Secondary | ICD-10-CM | POA: Diagnosis not present

## 2022-08-31 DIAGNOSIS — R1013 Epigastric pain: Secondary | ICD-10-CM

## 2022-08-31 DIAGNOSIS — E785 Hyperlipidemia, unspecified: Secondary | ICD-10-CM

## 2022-08-31 NOTE — Progress Notes (Unsigned)
Established patient visit   Patient: Gary Mueller   DOB: 03/08/1941   81 y.o. Male  MRN: [id number] Visit Date: 08/31/2022  Today's healthcare provider: Wilhemena Durie, MD   Chief Complaint  Patient presents with   Follow-up   Hyperlipidemia   Abdominal Pain   Subjective    HPI  Patient is an 81 year old male who presents today for 2 week follow up.  He is feeling fair but he continues to have epigastric discomfort with any exertion.  When walking, especially walking up steps causes discomfort and goes away if he rest.  No associated diaphoresis syncope presyncope or chest pain or shoulder pain.  He had the same pain which radiated to the shoulder when he had his MI. Food does not affect this according to patient Lipid/Cholesterol, Follow-up  Last lipid panel Other pertinent labs  Lab Results  Component Value Date   CHOL 189 08/09/2022   HDL 36 (L) 08/09/2022   LDLCALC 129 (H) 08/09/2022   TRIG 133 08/09/2022   CHOLHDL 5.0 02/01/2021   Lab Results  Component Value Date   ALT 13 08/17/2022   AST 18 08/17/2022   PLT 187 08/17/2022   TSH 4.980 (H) 08/09/2022     He was last seen for this 2 weeks ago.  Management since that visit includes restarting Rosuvastatin twice a week.  He reports good compliance with treatment. He is not having side effects.   Symptoms: No chest pain No chest pressure/discomfort  No dyspnea No lower extremity edema  No numbness or tingling of extremity No orthopnea  No palpitations No paroxysmal nocturnal dyspnea  No speech difficulty No syncope   Current diet: well balanced Current exercise:  stationary bike  The ASCVD Risk score (Arnett DK, et al., 2019) failed to calculate for the following reasons:   The 2019 ASCVD risk score is only valid for ages 11 to 49   The patient has a prior MI or stroke diagnosis  --------------------------------------------------------------------------------------------------- Epigastric  pain Patient is also to follow up today on his epigastric pain.  When he was seen 2 weeks ago he was started on Pantoprazole.  Possible referral to gastroenterology may be needed. Patient reports good compliance with treatment and good symptom control. He states the pain worsens when exercising.  Medications: Outpatient Medications Prior to Visit  Medication Sig   acetaminophen (TYLENOL) 500 MG tablet Take 1,000 mg by mouth daily as needed for moderate pain.   amLODipine (NORVASC) 5 MG tablet TAKE 1 TABLET BY MOUTH DAILY   ascorbic acid (VITAMIN C) 1000 MG tablet Take 1 tablet by mouth daily.   aspirin EC 81 MG EC tablet Take 1 tablet (81 mg total) by mouth daily. Swallow whole.   clopidogrel (PLAVIX) 75 MG tablet Take 75 mg by mouth daily.   fluticasone (FLONASE) 50 MCG/ACT nasal spray Place 2 sprays into both nostrils daily.   folic acid (FOLVITE) 1 MG tablet Take 1 mg by mouth daily.   levothyroxine (SYNTHROID) 112 MCG tablet Take 1 tablet (112 mcg total) by mouth daily.   losartan (COZAAR) 50 MG tablet TAKE ONE TABLET BY MOUTH EVERY DAY   methotrexate 2.5 MG tablet Take 15 mg by mouth every Wednesday.   metoprolol succinate (TOPROL-XL) 25 MG 24 hr tablet TAKE 1/2 TABLET BY MOUTH EVERY EVENING   Multiple Vitamin (MULTIVITAMIN WITH MINERALS) TABS tablet Take 1 tablet by mouth every evening.    nitroGLYCERIN (NITROSTAT) 0.4 MG SL tablet  Place 1 tablet (0.4 mg total) under the tongue every 5 (five) minutes as needed for chest pain.   pantoprazole (PROTONIX) 40 MG tablet TAKE ONE TABLET (40 MG) BY MOUTH EVERY DAY   rosuvastatin (CRESTOR) 40 MG tablet TAKE ONE TABLET BY MOUTH EVERY DAY   vitamin E 180 MG (400 UNITS) capsule Take 1 capsule by mouth daily.   ticagrelor (BRILINTA) 90 MG TABS tablet Take 1 tablet (90 mg total) by mouth 2 (two) times daily.   No facility-administered medications prior to visit.    Review of Systems  Constitutional:  Positive for fatigue. Negative for appetite  change, chills and fever.  Respiratory:  Positive for shortness of breath. Negative for chest tightness and wheezing.   Cardiovascular:  Negative for chest pain and palpitations.  Gastrointestinal:  Positive for abdominal distention. Negative for abdominal pain, nausea and vomiting.    Last CBC Lab Results  Component Value Date   WBC 5.4 08/17/2022   HGB 11.5 (L) 08/17/2022   HCT 34.3 (L) 08/17/2022   MCV 96 08/17/2022   MCH 32.2 08/17/2022   RDW 13.9 08/17/2022   PLT 187 08/17/2022       Objective    BP 133/70 (BP Location: Right Arm, Patient Position: Sitting, Cuff Size: Normal)   Pulse (!) 44   Temp (!) 97.5 F (36.4 C) (Oral)   Resp 12   Wt 170 lb (77.1 kg)   SpO2 98% Comment: room air  BMI 23.06 kg/m  BP Readings from Last 3 Encounters:  08/31/22 133/70  08/17/22 (!) 110/55  07/20/22 113/67   Wt Readings from Last 3 Encounters:  08/31/22 170 lb (77.1 kg)  08/17/22 168 lb 4.8 oz (76.3 kg)  07/20/22 169 lb (76.7 kg)      Physical Exam Vitals reviewed.  Constitutional:      Appearance: Normal appearance. He is well-developed.  HENT:     Head: Normocephalic and atraumatic.     Right Ear: External ear normal.     Left Ear: External ear normal.     Nose: Nose normal.  Eyes:     General: No scleral icterus.    Conjunctiva/sclera: Conjunctivae normal.  Neck:     Thyroid: No thyromegaly.     Vascular: No carotid bruit.  Cardiovascular:     Rate and Rhythm: Normal rate and regular rhythm.     Pulses: Normal pulses.     Heart sounds: Normal heart sounds.  Pulmonary:     Effort: Pulmonary effort is normal.     Breath sounds: Normal breath sounds.  Abdominal:     Palpations: Abdomen is soft.  Lymphadenopathy:     Cervical: No cervical adenopathy.  Skin:    General: Skin is warm and dry.     Capillary Refill: Capillary refill takes less than 2 seconds.  Neurological:     General: No focal deficit present.     Mental Status: He is alert and oriented to  person, place, and time.  Psychiatric:        Mood and Affect: Mood normal.        Behavior: Behavior normal.        Thought Content: Thought content normal.        Judgment: Judgment normal.     ECG reveals sinus bradycardia with first-degree AV block right bundle branch block no other significant abnormalities  No results found for any visits on 08/31/22.  Assessment & Plan     1. Epigastric pain He is  covered with pantoprazole for GI issues but I am concerned this is an anginal equivalent. He is having no significant rest symptoms at all.  Will refer back to cardiology in the next couple of weeks.  2. CAD in native artery Well treated by Dr. Saralyn Pilar  3. Hyperlipidemia, unspecified hyperlipidemia type On rosuvastatin  4. Need for influenza vaccination  - Flu Vaccine QUAD High Dose(Fluad)  5. Adult hypothyroidism Rate for euthyroid TSH  6. Atrial fibrillation, unspecified type ([place])    No follow-ups on file.      I, Wilhemena Durie, MD, have reviewed all documentation for this visit. The documentation on 09/04/22 for the exam, diagnosis, procedures, and orders are all accurate and complete.    Ralf Konopka Cranford Mon, MD  [facility] [phone] (phone) [phone] (fax)  [place]

## 2022-09-04 DIAGNOSIS — I10 Essential (primary) hypertension: Secondary | ICD-10-CM | POA: Diagnosis not present

## 2022-09-04 DIAGNOSIS — I2510 Atherosclerotic heart disease of native coronary artery without angina pectoris: Secondary | ICD-10-CM | POA: Diagnosis not present

## 2022-09-04 DIAGNOSIS — R079 Chest pain, unspecified: Secondary | ICD-10-CM | POA: Diagnosis not present

## 2022-09-04 DIAGNOSIS — E785 Hyperlipidemia, unspecified: Secondary | ICD-10-CM | POA: Diagnosis not present

## 2022-09-04 DIAGNOSIS — R0602 Shortness of breath: Secondary | ICD-10-CM | POA: Diagnosis not present

## 2022-09-04 DIAGNOSIS — I493 Ventricular premature depolarization: Secondary | ICD-10-CM | POA: Diagnosis not present

## 2022-09-18 DIAGNOSIS — I2510 Atherosclerotic heart disease of native coronary artery without angina pectoris: Secondary | ICD-10-CM | POA: Diagnosis not present

## 2022-09-18 DIAGNOSIS — R079 Chest pain, unspecified: Secondary | ICD-10-CM | POA: Diagnosis not present

## 2022-09-19 ENCOUNTER — Ambulatory Visit (INDEPENDENT_AMBULATORY_CARE_PROVIDER_SITE_OTHER): Payer: Medicare Other | Admitting: Family Medicine

## 2022-09-19 ENCOUNTER — Encounter: Payer: Self-pay | Admitting: Family Medicine

## 2022-09-19 VITALS — BP 144/53 | HR 55 | Resp 16 | Wt 169.0 lb

## 2022-09-19 DIAGNOSIS — I2510 Atherosclerotic heart disease of native coronary artery without angina pectoris: Secondary | ICD-10-CM

## 2022-09-19 DIAGNOSIS — K219 Gastro-esophageal reflux disease without esophagitis: Secondary | ICD-10-CM

## 2022-09-19 DIAGNOSIS — I4819 Other persistent atrial fibrillation: Secondary | ICD-10-CM | POA: Diagnosis not present

## 2022-09-19 DIAGNOSIS — E7800 Pure hypercholesterolemia, unspecified: Secondary | ICD-10-CM | POA: Diagnosis not present

## 2022-09-19 DIAGNOSIS — I10 Essential (primary) hypertension: Secondary | ICD-10-CM

## 2022-09-19 NOTE — Progress Notes (Unsigned)
      Established patient visit  I,April Miller,acting as a scribe for Wilhemena Durie, MD.,have documented all relevant documentation on the behalf of Wilhemena Durie, MD,as directed by  Wilhemena Durie, MD while in the presence of Wilhemena Durie, MD.   Patient: Gary Mueller   DOB: March 20, 1941   81 y.o. Male  MRN: [id number] Visit Date: 09/19/2022  Today's healthcare provider: Wilhemena Durie, MD   Chief Complaint  Patient presents with   Follow-up   Subjective    HPI  Patient wants to discuss stress test he had recently.  Medications: Outpatient Medications Prior to Visit  Medication Sig   acetaminophen (TYLENOL) 500 MG tablet Take 1,000 mg by mouth daily as needed for moderate pain.   amLODipine (NORVASC) 5 MG tablet TAKE 1 TABLET BY MOUTH DAILY   ascorbic acid (VITAMIN C) 1000 MG tablet Take 1 tablet by mouth daily.   aspirin EC 81 MG EC tablet Take 1 tablet (81 mg total) by mouth daily. Swallow whole.   clopidogrel (PLAVIX) 75 MG tablet Take 75 mg by mouth daily.   fluticasone (FLONASE) 50 MCG/ACT nasal spray Place 2 sprays into both nostrils daily.   folic acid (FOLVITE) 1 MG tablet Take 1 mg by mouth daily.   levothyroxine (SYNTHROID) 112 MCG tablet Take 1 tablet (112 mcg total) by mouth daily.   losartan (COZAAR) 50 MG tablet TAKE ONE TABLET BY MOUTH EVERY DAY   methotrexate 2.5 MG tablet Take 15 mg by mouth every Wednesday.   metoprolol succinate (TOPROL-XL) 25 MG 24 hr tablet TAKE 1/2 TABLET BY MOUTH EVERY EVENING   Multiple Vitamin (MULTIVITAMIN WITH MINERALS) TABS tablet Take 1 tablet by mouth every evening.    nitroGLYCERIN (NITROSTAT) 0.4 MG SL tablet Place 1 tablet (0.4 mg total) under the tongue every 5 (five) minutes as needed for chest pain.   pantoprazole (PROTONIX) 40 MG tablet TAKE ONE TABLET (40 MG) BY MOUTH EVERY DAY   rosuvastatin (CRESTOR) 40 MG tablet TAKE ONE TABLET BY MOUTH EVERY DAY   ticagrelor (BRILINTA) 90 MG TABS tablet Take 1  tablet (90 mg total) by mouth 2 (two) times daily.   vitamin E 180 MG (400 UNITS) capsule Take 1 capsule by mouth daily.   No facility-administered medications prior to visit.    Review of Systems  Constitutional:  Negative for appetite change, chills and fever.  Respiratory:  Negative for chest tightness, shortness of breath and wheezing.   Cardiovascular:  Negative for chest pain and palpitations.  Gastrointestinal:  Negative for abdominal pain, nausea and vomiting.    {Labs  Heme  Chem  Endocrine  Serology  Results Review (optional):23779}   Objective    BP (!) 144/53 (BP Location: Left Arm, Patient Position: Sitting, Cuff Size: Normal)   Pulse (!) 55   Resp 16   Wt 169 lb (76.7 kg)   SpO2 99%   BMI 22.92 kg/m  {Show previous vital signs (optional):23777}  Physical Exam  ***  No results found for any visits on 09/19/22.  Assessment & Plan     ***  No follow-ups on file.      {provider attestation***:1}   Wilhemena Durie, MD  [facility] [phone] (phone) [phone] (fax)  [place]

## 2022-09-28 DIAGNOSIS — I2510 Atherosclerotic heart disease of native coronary artery without angina pectoris: Secondary | ICD-10-CM | POA: Diagnosis not present

## 2022-09-28 DIAGNOSIS — I493 Ventricular premature depolarization: Secondary | ICD-10-CM | POA: Diagnosis not present

## 2022-09-28 DIAGNOSIS — I10 Essential (primary) hypertension: Secondary | ICD-10-CM | POA: Diagnosis not present

## 2022-09-28 DIAGNOSIS — I214 Non-ST elevation (NSTEMI) myocardial infarction: Secondary | ICD-10-CM | POA: Diagnosis not present

## 2022-09-28 DIAGNOSIS — R0602 Shortness of breath: Secondary | ICD-10-CM | POA: Diagnosis not present

## 2022-09-28 DIAGNOSIS — E785 Hyperlipidemia, unspecified: Secondary | ICD-10-CM | POA: Diagnosis not present

## 2022-09-28 DIAGNOSIS — R079 Chest pain, unspecified: Secondary | ICD-10-CM | POA: Diagnosis not present

## 2022-10-02 ENCOUNTER — Other Ambulatory Visit: Payer: Self-pay | Admitting: Family Medicine

## 2022-10-03 DIAGNOSIS — M180 Bilateral primary osteoarthritis of first carpometacarpal joints: Secondary | ICD-10-CM | POA: Diagnosis not present

## 2022-10-04 DIAGNOSIS — Z23 Encounter for immunization: Secondary | ICD-10-CM | POA: Diagnosis not present

## 2022-10-10 ENCOUNTER — Encounter: Payer: Self-pay | Admitting: Cardiology

## 2022-10-10 ENCOUNTER — Ambulatory Visit
Admission: RE | Admit: 2022-10-10 | Discharge: 2022-10-10 | Disposition: A | Discharge status: Home / Self Care | Payer: Medicare Other | Attending: Cardiology | Admitting: Cardiology

## 2022-10-10 ENCOUNTER — Encounter
Admission: RE | Disposition: A | Discharge status: Home / Self Care | Payer: Self-pay | Source: Home / Self Care | Attending: Cardiology

## 2022-10-10 ENCOUNTER — Other Ambulatory Visit: Payer: Self-pay

## 2022-10-10 DIAGNOSIS — I2510 Atherosclerotic heart disease of native coronary artery without angina pectoris: Secondary | ICD-10-CM | POA: Diagnosis not present

## 2022-10-10 DIAGNOSIS — R9430 Abnormal result of cardiovascular function study, unspecified: Secondary | ICD-10-CM | POA: Diagnosis present

## 2022-10-10 DIAGNOSIS — Z01818 Encounter for other preprocedural examination: Secondary | ICD-10-CM

## 2022-10-10 DIAGNOSIS — Z955 Presence of coronary angioplasty implant and graft: Secondary | ICD-10-CM | POA: Diagnosis not present

## 2022-10-10 DIAGNOSIS — I25118 Atherosclerotic heart disease of native coronary artery with other forms of angina pectoris: Secondary | ICD-10-CM | POA: Diagnosis not present

## 2022-10-10 HISTORY — PX: LEFT HEART CATH AND CORONARY ANGIOGRAPHY: CATH118249

## 2022-10-10 SURGERY — LEFT HEART CATH AND CORONARY ANGIOGRAPHY
Anesthesia: Moderate Sedation | Laterality: Left

## 2022-10-10 MED ORDER — VERAPAMIL HCL 2.5 MG/ML IV SOLN
INTRAVENOUS | Status: AC
  Filled 2022-10-10: qty 2

## 2022-10-10 MED ORDER — ONDANSETRON HCL 4 MG/2ML IJ SOLN: 4.0000 mg | Freq: Four times a day (QID) | INTRAMUSCULAR | Status: DC | PRN

## 2022-10-10 MED ORDER — SODIUM CHLORIDE 0.9% FLUSH: 3.0000 mL | INTRAVENOUS | Status: DC | PRN

## 2022-10-10 MED ORDER — FENTANYL CITRATE (PF) 100 MCG/2ML IJ SOLN
INTRAMUSCULAR | Status: DC | PRN
  Administered 2022-10-10: 25 ug via INTRAVENOUS

## 2022-10-10 MED ORDER — FENTANYL CITRATE (PF) 100 MCG/2ML IJ SOLN
INTRAMUSCULAR | Status: AC
  Filled 2022-10-10: qty 2

## 2022-10-10 MED ORDER — SODIUM CHLORIDE 0.9 % IV SOLN: 250.0000 mL | INTRAVENOUS | Status: DC | PRN

## 2022-10-10 MED ORDER — HYDRALAZINE HCL 20 MG/ML IJ SOLN: 10.0000 mg | INTRAMUSCULAR | Status: DC | PRN

## 2022-10-10 MED ORDER — SODIUM CHLORIDE 0.9 % WEIGHT BASED INFUSION: 1.0000 mL/kg/h | INTRAVENOUS | Status: DC

## 2022-10-10 MED ORDER — LIDOCAINE HCL 1 % IJ SOLN
INTRAMUSCULAR | Status: AC
  Filled 2022-10-10: qty 20

## 2022-10-10 MED ORDER — SODIUM CHLORIDE 0.9 % WEIGHT BASED INFUSION
3.0000 mL/kg/h | INTRAVENOUS | Status: AC
  Administered 2022-10-10: 3 mL/kg/h via INTRAVENOUS

## 2022-10-10 MED ORDER — MIDAZOLAM HCL 2 MG/2ML IJ SOLN
INTRAMUSCULAR | Status: AC
  Filled 2022-10-10: qty 2

## 2022-10-10 MED ORDER — MIDAZOLAM HCL 2 MG/2ML IJ SOLN
INTRAMUSCULAR | Status: DC | PRN
  Administered 2022-10-10: 1 mg via INTRAVENOUS

## 2022-10-10 MED ORDER — ACETAMINOPHEN 325 MG PO TABS: 650.0000 mg | ORAL_TABLET | ORAL | Status: DC | PRN

## 2022-10-10 MED ORDER — LIDOCAINE HCL (PF) 1 % IJ SOLN
INTRAMUSCULAR | Status: DC | PRN
  Administered 2022-10-10: 15 mL
  Administered 2022-10-10: 2 mL

## 2022-10-10 MED ORDER — IOHEXOL 300 MG/ML  SOLN
INTRAMUSCULAR | Status: DC | PRN
  Administered 2022-10-10: 85 mL

## 2022-10-10 MED ORDER — SODIUM CHLORIDE 0.9% FLUSH: 3.0000 mL | Freq: Two times a day (BID) | INTRAVENOUS | Status: DC

## 2022-10-10 MED ORDER — HEPARIN (PORCINE) IN NACL 1000-0.9 UT/500ML-% IV SOLN
INTRAVENOUS | Status: DC | PRN
  Administered 2022-10-10 (×2): 500 mL

## 2022-10-10 MED ORDER — HEPARIN (PORCINE) IN NACL 1000-0.9 UT/500ML-% IV SOLN
INTRAVENOUS | Status: AC
  Filled 2022-10-10: qty 1000

## 2022-10-10 MED ORDER — HEPARIN SODIUM (PORCINE) 1000 UNIT/ML IJ SOLN
INTRAMUSCULAR | Status: AC
  Filled 2022-10-10: qty 10

## 2022-10-10 MED ORDER — LABETALOL HCL 5 MG/ML IV SOLN: 10.0000 mg | INTRAVENOUS | Status: DC | PRN

## 2022-10-10 MED ORDER — ASPIRIN 81 MG PO CHEW
81.0000 mg | CHEWABLE_TABLET | ORAL | Status: AC
  Administered 2022-10-10: 81 mg via ORAL

## 2022-10-10 MED ORDER — ASPIRIN 81 MG PO CHEW
CHEWABLE_TABLET | ORAL | Status: AC
  Filled 2022-10-10: qty 1

## 2022-10-10 SURGICAL SUPPLY — 14 items
CATH INFINITI 5FR MULTPACK ANG (CATHETERS) IMPLANT
DEVICE CLOSURE MYNXGRIP 5F (Vascular Products) IMPLANT
DRAPE BRACHIAL (DRAPES) IMPLANT
GLIDESHEATH SLEND SS 6F .021 (SHEATH) IMPLANT
GUIDEWIRE INQWIRE 1.5J.035X260 (WIRE) IMPLANT
INQWIRE 1.5J .035X260CM (WIRE) ×1
NDL PERC 18GX7CM (NEEDLE) IMPLANT
NEEDLE PERC 18GX7CM (NEEDLE) ×1 IMPLANT
PACK CARDIAC CATH (CUSTOM PROCEDURE TRAY) ×1 IMPLANT
PROTECTION STATION PRESSURIZED (MISCELLANEOUS) ×1
SET ATX SIMPLICITY (MISCELLANEOUS) IMPLANT
SHEATH AVANTI 5FR X 11CM (SHEATH) IMPLANT
STATION PROTECTION PRESSURIZED (MISCELLANEOUS) IMPLANT
WIRE GUIDERIGHT .035X150 (WIRE) IMPLANT

## 2022-10-11 ENCOUNTER — Encounter: Payer: Self-pay | Admitting: Cardiology

## 2022-10-16 ENCOUNTER — Other Ambulatory Visit: Payer: Self-pay | Admitting: Family Medicine

## 2022-10-16 DIAGNOSIS — Z961 Presence of intraocular lens: Secondary | ICD-10-CM | POA: Diagnosis not present

## 2022-10-16 DIAGNOSIS — H353132 Nonexudative age-related macular degeneration, bilateral, intermediate dry stage: Secondary | ICD-10-CM | POA: Diagnosis not present

## 2022-10-16 DIAGNOSIS — H02402 Unspecified ptosis of left eyelid: Secondary | ICD-10-CM | POA: Diagnosis not present

## 2022-10-16 DIAGNOSIS — H353232 Exudative age-related macular degeneration, bilateral, with inactive choroidal neovascularization: Secondary | ICD-10-CM | POA: Diagnosis not present

## 2022-10-16 DIAGNOSIS — H0015 Chalazion left lower eyelid: Secondary | ICD-10-CM | POA: Diagnosis not present

## 2022-10-16 DIAGNOSIS — H5712 Ocular pain, left eye: Secondary | ICD-10-CM | POA: Diagnosis not present

## 2022-10-16 DIAGNOSIS — H35352 Cystoid macular degeneration, left eye: Secondary | ICD-10-CM | POA: Diagnosis not present

## 2022-10-19 DIAGNOSIS — I10 Essential (primary) hypertension: Secondary | ICD-10-CM | POA: Diagnosis not present

## 2022-10-19 DIAGNOSIS — I2510 Atherosclerotic heart disease of native coronary artery without angina pectoris: Secondary | ICD-10-CM | POA: Diagnosis not present

## 2022-10-19 DIAGNOSIS — E785 Hyperlipidemia, unspecified: Secondary | ICD-10-CM | POA: Diagnosis not present

## 2022-10-19 DIAGNOSIS — R0602 Shortness of breath: Secondary | ICD-10-CM | POA: Diagnosis not present

## 2022-10-19 DIAGNOSIS — I214 Non-ST elevation (NSTEMI) myocardial infarction: Secondary | ICD-10-CM | POA: Diagnosis not present

## 2022-10-19 DIAGNOSIS — I493 Ventricular premature depolarization: Secondary | ICD-10-CM | POA: Diagnosis not present

## 2022-12-04 DIAGNOSIS — R053 Chronic cough: Secondary | ICD-10-CM | POA: Diagnosis not present

## 2022-12-04 DIAGNOSIS — E039 Hypothyroidism, unspecified: Secondary | ICD-10-CM | POA: Diagnosis not present

## 2022-12-04 DIAGNOSIS — Z0000 Encounter for general adult medical examination without abnormal findings: Secondary | ICD-10-CM | POA: Diagnosis not present

## 2022-12-04 DIAGNOSIS — F419 Anxiety disorder, unspecified: Secondary | ICD-10-CM | POA: Diagnosis not present

## 2022-12-04 DIAGNOSIS — E785 Hyperlipidemia, unspecified: Secondary | ICD-10-CM | POA: Diagnosis not present

## 2022-12-04 DIAGNOSIS — Z23 Encounter for immunization: Secondary | ICD-10-CM | POA: Diagnosis not present

## 2022-12-04 DIAGNOSIS — I2510 Atherosclerotic heart disease of native coronary artery without angina pectoris: Secondary | ICD-10-CM | POA: Diagnosis not present

## 2022-12-04 DIAGNOSIS — R0609 Other forms of dyspnea: Secondary | ICD-10-CM | POA: Diagnosis not present

## 2022-12-04 DIAGNOSIS — H3530 Unspecified macular degeneration: Secondary | ICD-10-CM | POA: Diagnosis not present

## 2022-12-04 DIAGNOSIS — K219 Gastro-esophageal reflux disease without esophagitis: Secondary | ICD-10-CM | POA: Diagnosis not present

## 2022-12-04 DIAGNOSIS — I10 Essential (primary) hypertension: Secondary | ICD-10-CM | POA: Diagnosis not present

## 2022-12-04 DIAGNOSIS — R059 Cough, unspecified: Secondary | ICD-10-CM | POA: Diagnosis not present

## 2022-12-04 DIAGNOSIS — M069 Rheumatoid arthritis, unspecified: Secondary | ICD-10-CM | POA: Diagnosis not present

## 2022-12-13 DIAGNOSIS — H353231 Exudative age-related macular degeneration, bilateral, with active choroidal neovascularization: Secondary | ICD-10-CM | POA: Diagnosis not present

## 2022-12-14 DIAGNOSIS — D2272 Melanocytic nevi of left lower limb, including hip: Secondary | ICD-10-CM | POA: Diagnosis not present

## 2022-12-14 DIAGNOSIS — D225 Melanocytic nevi of trunk: Secondary | ICD-10-CM | POA: Diagnosis not present

## 2022-12-14 DIAGNOSIS — L817 Pigmented purpuric dermatosis: Secondary | ICD-10-CM | POA: Diagnosis not present

## 2022-12-14 DIAGNOSIS — L570 Actinic keratosis: Secondary | ICD-10-CM | POA: Diagnosis not present

## 2022-12-14 DIAGNOSIS — Z85828 Personal history of other malignant neoplasm of skin: Secondary | ICD-10-CM | POA: Diagnosis not present

## 2022-12-14 DIAGNOSIS — X32XXXA Exposure to sunlight, initial encounter: Secondary | ICD-10-CM | POA: Diagnosis not present

## 2022-12-14 DIAGNOSIS — D2261 Melanocytic nevi of right upper limb, including shoulder: Secondary | ICD-10-CM | POA: Diagnosis not present

## 2023-01-01 ENCOUNTER — Telehealth: Payer: Self-pay | Admitting: Family Medicine

## 2023-01-01 MED ORDER — METOPROLOL SUCCINATE ER 25 MG PO TB24: 12.5000 mg | ORAL_TABLET | Freq: Every evening | ORAL | 0 refills | Status: DC

## 2023-01-01 NOTE — Telephone Encounter (Signed)
[facility] pharmacy faxed refill request for the following medications:   metoprolol succinate (TOPROL-XL) 25 MG 24 hr tablet    Please advise

## 2023-01-10 DIAGNOSIS — I700 Atherosclerosis of aorta: Secondary | ICD-10-CM | POA: Diagnosis not present

## 2023-01-10 DIAGNOSIS — R918 Other nonspecific abnormal finding of lung field: Secondary | ICD-10-CM | POA: Diagnosis not present

## 2023-01-10 DIAGNOSIS — R9389 Abnormal findings on diagnostic imaging of other specified body structures: Secondary | ICD-10-CM | POA: Diagnosis not present

## 2023-01-10 DIAGNOSIS — E039 Hypothyroidism, unspecified: Secondary | ICD-10-CM | POA: Diagnosis not present

## 2023-01-10 DIAGNOSIS — D649 Anemia, unspecified: Secondary | ICD-10-CM | POA: Diagnosis not present

## 2023-01-14 ENCOUNTER — Other Ambulatory Visit: Payer: Self-pay | Admitting: Physician Assistant

## 2023-01-27 ENCOUNTER — Other Ambulatory Visit: Payer: Self-pay | Admitting: Family Medicine

## 2023-02-05 DIAGNOSIS — H02402 Unspecified ptosis of left eyelid: Secondary | ICD-10-CM | POA: Diagnosis not present

## 2023-02-05 DIAGNOSIS — H353132 Nonexudative age-related macular degeneration, bilateral, intermediate dry stage: Secondary | ICD-10-CM | POA: Diagnosis not present

## 2023-02-05 DIAGNOSIS — H40009 Preglaucoma, unspecified, unspecified eye: Secondary | ICD-10-CM | POA: Diagnosis not present

## 2023-02-05 DIAGNOSIS — H5712 Ocular pain, left eye: Secondary | ICD-10-CM | POA: Diagnosis not present

## 2023-02-05 DIAGNOSIS — H04123 Dry eye syndrome of bilateral lacrimal glands: Secondary | ICD-10-CM | POA: Diagnosis not present

## 2023-02-05 DIAGNOSIS — Z961 Presence of intraocular lens: Secondary | ICD-10-CM | POA: Diagnosis not present

## 2023-02-05 DIAGNOSIS — H0015 Chalazion left lower eyelid: Secondary | ICD-10-CM | POA: Diagnosis not present

## 2023-02-05 DIAGNOSIS — H353231 Exudative age-related macular degeneration, bilateral, with active choroidal neovascularization: Secondary | ICD-10-CM | POA: Diagnosis not present

## 2023-02-05 DIAGNOSIS — H353232 Exudative age-related macular degeneration, bilateral, with inactive choroidal neovascularization: Secondary | ICD-10-CM | POA: Diagnosis not present

## 2023-02-05 DIAGNOSIS — H35352 Cystoid macular degeneration, left eye: Secondary | ICD-10-CM | POA: Diagnosis not present

## 2023-02-12 DIAGNOSIS — E039 Hypothyroidism, unspecified: Secondary | ICD-10-CM | POA: Diagnosis not present

## 2023-02-12 DIAGNOSIS — D649 Anemia, unspecified: Secondary | ICD-10-CM | POA: Diagnosis not present

## 2023-02-19 DIAGNOSIS — E785 Hyperlipidemia, unspecified: Secondary | ICD-10-CM | POA: Diagnosis not present

## 2023-02-19 DIAGNOSIS — I493 Ventricular premature depolarization: Secondary | ICD-10-CM | POA: Diagnosis not present

## 2023-02-19 DIAGNOSIS — I2510 Atherosclerotic heart disease of native coronary artery without angina pectoris: Secondary | ICD-10-CM | POA: Diagnosis not present

## 2023-02-19 DIAGNOSIS — I4819 Other persistent atrial fibrillation: Secondary | ICD-10-CM | POA: Diagnosis not present

## 2023-02-19 DIAGNOSIS — I10 Essential (primary) hypertension: Secondary | ICD-10-CM | POA: Diagnosis not present

## 2023-02-20 DIAGNOSIS — B079 Viral wart, unspecified: Secondary | ICD-10-CM | POA: Diagnosis not present

## 2023-02-20 DIAGNOSIS — L910 Hypertrophic scar: Secondary | ICD-10-CM | POA: Diagnosis not present

## 2023-02-26 ENCOUNTER — Other Ambulatory Visit: Payer: Self-pay

## 2023-02-26 DIAGNOSIS — E7800 Pure hypercholesterolemia, unspecified: Secondary | ICD-10-CM

## 2023-02-26 DIAGNOSIS — I214 Non-ST elevation (NSTEMI) myocardial infarction: Secondary | ICD-10-CM

## 2023-02-26 DIAGNOSIS — I10 Essential (primary) hypertension: Secondary | ICD-10-CM

## 2023-03-07 DIAGNOSIS — H018 Other specified inflammations of eyelid: Secondary | ICD-10-CM | POA: Diagnosis not present

## 2023-03-07 DIAGNOSIS — H04123 Dry eye syndrome of bilateral lacrimal glands: Secondary | ICD-10-CM | POA: Diagnosis not present

## 2023-03-19 DIAGNOSIS — H02402 Unspecified ptosis of left eyelid: Secondary | ICD-10-CM | POA: Diagnosis not present

## 2023-03-19 DIAGNOSIS — Z961 Presence of intraocular lens: Secondary | ICD-10-CM | POA: Diagnosis not present

## 2023-03-19 DIAGNOSIS — H353232 Exudative age-related macular degeneration, bilateral, with inactive choroidal neovascularization: Secondary | ICD-10-CM | POA: Diagnosis not present

## 2023-03-19 DIAGNOSIS — H35352 Cystoid macular degeneration, left eye: Secondary | ICD-10-CM | POA: Diagnosis not present

## 2023-03-19 DIAGNOSIS — H353132 Nonexudative age-related macular degeneration, bilateral, intermediate dry stage: Secondary | ICD-10-CM | POA: Diagnosis not present

## 2023-03-19 DIAGNOSIS — H5712 Ocular pain, left eye: Secondary | ICD-10-CM | POA: Diagnosis not present

## 2023-04-09 ENCOUNTER — Other Ambulatory Visit: Payer: Self-pay | Admitting: Family Medicine

## 2023-04-16 ENCOUNTER — Other Ambulatory Visit: Payer: Self-pay | Admitting: Family Medicine

## 2023-04-18 DIAGNOSIS — H018 Other specified inflammations of eyelid: Secondary | ICD-10-CM | POA: Diagnosis not present

## 2023-04-18 DIAGNOSIS — H04123 Dry eye syndrome of bilateral lacrimal glands: Secondary | ICD-10-CM | POA: Diagnosis not present

## 2023-04-23 ENCOUNTER — Other Ambulatory Visit: Payer: Self-pay | Admitting: Family Medicine

## 2023-04-24 DIAGNOSIS — Z85828 Personal history of other malignant neoplasm of skin: Secondary | ICD-10-CM | POA: Diagnosis not present

## 2023-04-24 DIAGNOSIS — L568 Other specified acute skin changes due to ultraviolet radiation: Secondary | ICD-10-CM | POA: Diagnosis not present

## 2023-05-07 DIAGNOSIS — H353232 Exudative age-related macular degeneration, bilateral, with inactive choroidal neovascularization: Secondary | ICD-10-CM | POA: Diagnosis not present

## 2023-05-22 DIAGNOSIS — D040 Carcinoma in situ of skin of lip: Secondary | ICD-10-CM | POA: Diagnosis not present

## 2023-05-22 DIAGNOSIS — L568 Other specified acute skin changes due to ultraviolet radiation: Secondary | ICD-10-CM | POA: Diagnosis not present

## 2023-05-28 DIAGNOSIS — M1380 Other specified arthritis, unspecified site: Secondary | ICD-10-CM | POA: Diagnosis not present

## 2023-05-28 DIAGNOSIS — Z7960 Long term (current) use of unspecified immunomodulators and immunosuppressants: Secondary | ICD-10-CM | POA: Diagnosis not present

## 2023-06-18 DIAGNOSIS — I493 Ventricular premature depolarization: Secondary | ICD-10-CM | POA: Diagnosis not present

## 2023-06-18 DIAGNOSIS — E785 Hyperlipidemia, unspecified: Secondary | ICD-10-CM | POA: Diagnosis not present

## 2023-06-18 DIAGNOSIS — I2510 Atherosclerotic heart disease of native coronary artery without angina pectoris: Secondary | ICD-10-CM | POA: Diagnosis not present

## 2023-06-18 DIAGNOSIS — I10 Essential (primary) hypertension: Secondary | ICD-10-CM | POA: Diagnosis not present

## 2023-06-19 DIAGNOSIS — M069 Rheumatoid arthritis, unspecified: Secondary | ICD-10-CM | POA: Diagnosis not present

## 2023-06-19 DIAGNOSIS — E034 Atrophy of thyroid (acquired): Secondary | ICD-10-CM | POA: Diagnosis not present

## 2023-06-19 DIAGNOSIS — E785 Hyperlipidemia, unspecified: Secondary | ICD-10-CM | POA: Diagnosis not present

## 2023-06-19 DIAGNOSIS — K219 Gastro-esophageal reflux disease without esophagitis: Secondary | ICD-10-CM | POA: Diagnosis not present

## 2023-06-19 DIAGNOSIS — I10 Essential (primary) hypertension: Secondary | ICD-10-CM | POA: Diagnosis not present

## 2023-06-25 DIAGNOSIS — H5712 Ocular pain, left eye: Secondary | ICD-10-CM | POA: Diagnosis not present

## 2023-06-25 DIAGNOSIS — Z961 Presence of intraocular lens: Secondary | ICD-10-CM | POA: Diagnosis not present

## 2023-06-25 DIAGNOSIS — H02402 Unspecified ptosis of left eyelid: Secondary | ICD-10-CM | POA: Diagnosis not present

## 2023-06-25 DIAGNOSIS — H0015 Chalazion left lower eyelid: Secondary | ICD-10-CM | POA: Diagnosis not present

## 2023-06-25 DIAGNOSIS — H353232 Exudative age-related macular degeneration, bilateral, with inactive choroidal neovascularization: Secondary | ICD-10-CM | POA: Diagnosis not present

## 2023-06-25 DIAGNOSIS — H353120 Nonexudative age-related macular degeneration, left eye, stage unspecified: Secondary | ICD-10-CM | POA: Diagnosis not present

## 2023-06-25 DIAGNOSIS — H353132 Nonexudative age-related macular degeneration, bilateral, intermediate dry stage: Secondary | ICD-10-CM | POA: Diagnosis not present

## 2023-06-25 DIAGNOSIS — H40009 Preglaucoma, unspecified, unspecified eye: Secondary | ICD-10-CM | POA: Diagnosis not present

## 2023-06-26 DIAGNOSIS — I2510 Atherosclerotic heart disease of native coronary artery without angina pectoris: Secondary | ICD-10-CM | POA: Diagnosis not present

## 2023-06-26 DIAGNOSIS — E039 Hypothyroidism, unspecified: Secondary | ICD-10-CM | POA: Diagnosis not present

## 2023-06-26 DIAGNOSIS — E785 Hyperlipidemia, unspecified: Secondary | ICD-10-CM | POA: Diagnosis not present

## 2023-06-26 DIAGNOSIS — M1990 Unspecified osteoarthritis, unspecified site: Secondary | ICD-10-CM | POA: Diagnosis not present

## 2023-06-26 DIAGNOSIS — I10 Essential (primary) hypertension: Secondary | ICD-10-CM | POA: Diagnosis not present

## 2023-06-26 DIAGNOSIS — H3530 Unspecified macular degeneration: Secondary | ICD-10-CM | POA: Diagnosis not present

## 2023-07-04 ENCOUNTER — Telehealth: Payer: Self-pay | Admitting: Family Medicine

## 2023-07-04 NOTE — Telephone Encounter (Signed)
Patient transferring to [facility] according to [facility] 04/10/2023 cancellation reason.

## 2023-07-16 DIAGNOSIS — H04129 Dry eye syndrome of unspecified lacrimal gland: Secondary | ICD-10-CM | POA: Diagnosis not present

## 2023-07-16 DIAGNOSIS — H018 Other specified inflammations of eyelid: Secondary | ICD-10-CM | POA: Diagnosis not present

## 2023-07-23 ENCOUNTER — Other Ambulatory Visit: Payer: Self-pay | Admitting: Family Medicine

## 2023-07-24 NOTE — Telephone Encounter (Signed)
Pt. No longer seen at practice.

## 2023-08-01 DIAGNOSIS — M19041 Primary osteoarthritis, right hand: Secondary | ICD-10-CM | POA: Diagnosis not present

## 2023-08-01 DIAGNOSIS — M19042 Primary osteoarthritis, left hand: Secondary | ICD-10-CM | POA: Diagnosis not present

## 2023-08-01 DIAGNOSIS — M1380 Other specified arthritis, unspecified site: Secondary | ICD-10-CM | POA: Diagnosis not present

## 2023-08-13 DIAGNOSIS — H353231 Exudative age-related macular degeneration, bilateral, with active choroidal neovascularization: Secondary | ICD-10-CM | POA: Diagnosis not present

## 2023-08-16 DIAGNOSIS — M79642 Pain in left hand: Secondary | ICD-10-CM | POA: Diagnosis not present

## 2023-08-16 DIAGNOSIS — M1812 Unilateral primary osteoarthritis of first carpometacarpal joint, left hand: Secondary | ICD-10-CM | POA: Diagnosis not present

## 2023-09-11 DIAGNOSIS — D692 Other nonthrombocytopenic purpura: Secondary | ICD-10-CM | POA: Diagnosis not present

## 2023-09-11 DIAGNOSIS — L814 Other melanin hyperpigmentation: Secondary | ICD-10-CM | POA: Diagnosis not present

## 2023-09-11 DIAGNOSIS — L821 Other seborrheic keratosis: Secondary | ICD-10-CM | POA: Diagnosis not present

## 2023-09-11 DIAGNOSIS — L568 Other specified acute skin changes due to ultraviolet radiation: Secondary | ICD-10-CM | POA: Diagnosis not present

## 2023-09-11 DIAGNOSIS — Z85828 Personal history of other malignant neoplasm of skin: Secondary | ICD-10-CM | POA: Diagnosis not present

## 2023-09-11 DIAGNOSIS — D1801 Hemangioma of skin and subcutaneous tissue: Secondary | ICD-10-CM | POA: Diagnosis not present

## 2023-09-11 DIAGNOSIS — L578 Other skin changes due to chronic exposure to nonionizing radiation: Secondary | ICD-10-CM | POA: Diagnosis not present

## 2023-10-01 DIAGNOSIS — H353231 Exudative age-related macular degeneration, bilateral, with active choroidal neovascularization: Secondary | ICD-10-CM | POA: Diagnosis not present

## 2023-10-02 DIAGNOSIS — Z23 Encounter for immunization: Secondary | ICD-10-CM | POA: Diagnosis not present

## 2023-10-08 ENCOUNTER — Other Ambulatory Visit: Payer: Self-pay | Admitting: Family Medicine

## 2023-10-08 NOTE — Telephone Encounter (Signed)
Requested Prescriptions  Refused Prescriptions Disp Refills   pantoprazole (PROTONIX) 40 MG tablet [Pharmacy Med Name: PANTOPRAZOLE SODIUM 40 MG DR TAB] 90 tablet 1    Sig: TAKE ONE TABLET (40 MG) BY MOUTH EVERY DAY     Gastroenterology: Proton Pump Inhibitors Failed - 10/08/2023  3:57 AM      Failed - Valid encounter within last 12 months    Recent Outpatient Visits           1 year ago CAD in native artery   [facility] Bosie Clos, MD   1 year ago Epigastric pain   [place] [facility] Bosie Clos, MD   1 year ago CAD in native artery   [facility] Bosie Clos, MD   1 year ago Essential hypertension   [place] [facility] Bosie Clos, MD   1 year ago Essential hypertension    [facility] Bosie Clos, MD

## 2023-10-18 DIAGNOSIS — M19041 Primary osteoarthritis, right hand: Secondary | ICD-10-CM | POA: Diagnosis not present

## 2023-10-18 DIAGNOSIS — M1811 Unilateral primary osteoarthritis of first carpometacarpal joint, right hand: Secondary | ICD-10-CM | POA: Diagnosis not present

## 2023-10-22 ENCOUNTER — Other Ambulatory Visit: Payer: Self-pay | Admitting: Family Medicine

## 2023-11-19 DIAGNOSIS — H02402 Unspecified ptosis of left eyelid: Secondary | ICD-10-CM | POA: Diagnosis not present

## 2023-11-19 DIAGNOSIS — Z961 Presence of intraocular lens: Secondary | ICD-10-CM | POA: Diagnosis not present

## 2023-11-19 DIAGNOSIS — H353132 Nonexudative age-related macular degeneration, bilateral, intermediate dry stage: Secondary | ICD-10-CM | POA: Diagnosis not present

## 2023-11-19 DIAGNOSIS — H35352 Cystoid macular degeneration, left eye: Secondary | ICD-10-CM | POA: Diagnosis not present

## 2023-11-19 DIAGNOSIS — H353232 Exudative age-related macular degeneration, bilateral, with inactive choroidal neovascularization: Secondary | ICD-10-CM | POA: Diagnosis not present

## 2023-11-20 DIAGNOSIS — M069 Rheumatoid arthritis, unspecified: Secondary | ICD-10-CM | POA: Diagnosis not present

## 2023-11-20 DIAGNOSIS — I10 Essential (primary) hypertension: Secondary | ICD-10-CM | POA: Diagnosis not present

## 2023-11-20 DIAGNOSIS — I2510 Atherosclerotic heart disease of native coronary artery without angina pectoris: Secondary | ICD-10-CM | POA: Diagnosis not present

## 2023-11-20 DIAGNOSIS — E785 Hyperlipidemia, unspecified: Secondary | ICD-10-CM | POA: Diagnosis not present

## 2023-11-20 DIAGNOSIS — R7303 Prediabetes: Secondary | ICD-10-CM | POA: Diagnosis not present

## 2023-11-20 DIAGNOSIS — E034 Atrophy of thyroid (acquired): Secondary | ICD-10-CM | POA: Diagnosis not present

## 2023-11-20 DIAGNOSIS — R700 Elevated erythrocyte sedimentation rate: Secondary | ICD-10-CM | POA: Diagnosis not present

## 2023-11-27 DIAGNOSIS — Z66 Do not resuscitate: Secondary | ICD-10-CM | POA: Diagnosis not present

## 2023-11-27 DIAGNOSIS — M064 Inflammatory polyarthropathy: Secondary | ICD-10-CM | POA: Diagnosis not present

## 2023-11-27 DIAGNOSIS — H3530 Unspecified macular degeneration: Secondary | ICD-10-CM | POA: Diagnosis not present

## 2023-11-27 DIAGNOSIS — E785 Hyperlipidemia, unspecified: Secondary | ICD-10-CM | POA: Diagnosis not present

## 2023-11-27 DIAGNOSIS — Z0000 Encounter for general adult medical examination without abnormal findings: Secondary | ICD-10-CM | POA: Diagnosis not present

## 2023-11-27 DIAGNOSIS — I10 Essential (primary) hypertension: Secondary | ICD-10-CM | POA: Diagnosis not present

## 2023-11-27 DIAGNOSIS — E039 Hypothyroidism, unspecified: Secondary | ICD-10-CM | POA: Diagnosis not present

## 2023-11-27 DIAGNOSIS — I2510 Atherosclerotic heart disease of native coronary artery without angina pectoris: Secondary | ICD-10-CM | POA: Diagnosis not present

## 2023-11-27 DIAGNOSIS — Z131 Encounter for screening for diabetes mellitus: Secondary | ICD-10-CM | POA: Diagnosis not present

## 2023-12-10 DIAGNOSIS — E785 Hyperlipidemia, unspecified: Secondary | ICD-10-CM | POA: Diagnosis not present

## 2023-12-10 DIAGNOSIS — I2510 Atherosclerotic heart disease of native coronary artery without angina pectoris: Secondary | ICD-10-CM | POA: Diagnosis not present

## 2023-12-10 DIAGNOSIS — I493 Ventricular premature depolarization: Secondary | ICD-10-CM | POA: Diagnosis not present

## 2023-12-10 DIAGNOSIS — N1831 Chronic kidney disease, stage 3a: Secondary | ICD-10-CM | POA: Diagnosis not present

## 2023-12-10 DIAGNOSIS — I10 Essential (primary) hypertension: Secondary | ICD-10-CM | POA: Diagnosis not present

## 2024-03-20 ENCOUNTER — Other Ambulatory Visit: Payer: Self-pay | Admitting: Internal Medicine

## 2024-03-20 ENCOUNTER — Ambulatory Visit
Admission: RE | Admit: 2024-03-20 | Discharge: 2024-03-20 | Disposition: A | Discharge status: Home / Self Care | Source: Ambulatory Visit | Attending: Internal Medicine | Admitting: Internal Medicine

## 2024-03-20 DIAGNOSIS — R7989 Other specified abnormal findings of blood chemistry: Secondary | ICD-10-CM

## 2024-03-20 DIAGNOSIS — R0602 Shortness of breath: Secondary | ICD-10-CM

## 2024-03-20 MED ORDER — IOHEXOL 350 MG/ML SOLN
75.0000 mL | Freq: Once | INTRAVENOUS | Status: AC | PRN
  Administered 2024-03-20: 75 mL via INTRAVENOUS

## 2024-05-02 ENCOUNTER — Other Ambulatory Visit: Payer: Self-pay | Admitting: Pulmonary Disease

## 2024-05-02 DIAGNOSIS — R053 Chronic cough: Secondary | ICD-10-CM

## 2024-05-02 DIAGNOSIS — J986 Disorders of diaphragm: Secondary | ICD-10-CM

## 2024-05-07 ENCOUNTER — Ambulatory Visit
Admission: RE | Admit: 2024-05-07 | Discharge: 2024-05-07 | Disposition: A | Discharge status: Home / Self Care | Source: Ambulatory Visit | Attending: Pulmonary Disease | Admitting: Pulmonary Disease

## 2024-05-07 DIAGNOSIS — J986 Disorders of diaphragm: Secondary | ICD-10-CM | POA: Diagnosis present

## 2024-05-07 DIAGNOSIS — R053 Chronic cough: Secondary | ICD-10-CM | POA: Diagnosis present

## 2024-05-13 ENCOUNTER — Ambulatory Visit: Attending: Rheumatology

## 2024-05-13 DIAGNOSIS — M19042 Primary osteoarthritis, left hand: Secondary | ICD-10-CM | POA: Insufficient documentation

## 2024-05-13 DIAGNOSIS — M6281 Muscle weakness (generalized): Secondary | ICD-10-CM | POA: Diagnosis present

## 2024-05-13 DIAGNOSIS — M19041 Primary osteoarthritis, right hand: Secondary | ICD-10-CM | POA: Diagnosis present

## 2024-05-13 DIAGNOSIS — R278 Other lack of coordination: Secondary | ICD-10-CM | POA: Diagnosis present

## 2024-05-13 NOTE — Therapy (Signed)
 OUTPATIENT OCCUPATIONAL THERAPY ORTHO EVALUATION  Patient Name: Gary Mueller MRN: [id number] DOB:1941-09-08, 83 y.o., male Today's Date: 05/14/2024  PCP: Dr. Eddy Mueller ([facility]) REFERRING PROVIDER: Adolphus Akin, MD (Rheumatology)  END OF SESSION:   OT End of Session - 05/14/24 1325     Visit Number 1    Number of Visits 24    Date for OT Re-Evaluation 08/05/24    OT Start Time 1400    OT Stop Time 1445    OT Time Calculation (min) 45 min    Activity Tolerance Patient tolerated treatment well    Behavior During Therapy [facility] for tasks assessed/performed             Past Medical History:  Diagnosis Date   Allergy    Anxiety    Arthritis    Cancer (HCC) 10/20/2016   Basal Cell Skin Cancer; lip, neck   Cystoid macular edema of left eye 07/07/2019   Depression    Dysrhythmia    A FIB   GERD (gastroesophageal reflux disease)    HOH (hard of hearing)    Bilateral   Hyperlipidemia    Hypertension    Patient denies   Hypothyroidism    IBS (irritable bowel syndrome)    Inguinal hernia    bilateral   Pneumonia    Thyroid  disease    Tinnitus of both ears    Past Surgical History:  Procedure Laterality Date   ANTERIOR VITRECTOMY Left 11/28/2018   Procedure: ANTERIOR VITRECTOMY;  Surgeon: Ola Berger, MD;  Location: [facility] ORS;  Service: Ophthalmology;  Laterality: Left;   CARDIAC CATHETERIZATION     CATARACT EXTRACTION W/PHACO Right 10/10/2018   Procedure: CATARACT EXTRACTION PHACO AND INTRAOCULAR LENS PLACEMENT (IOC);  Surgeon: Ola Berger, MD;  Location: [facility] ORS;  Service: Ophthalmology;  Laterality: Right;  US  00:41 CDE 6.87 Fluid pack lot # [id number]   CATARACT EXTRACTION W/PHACO Left 11/28/2018   Procedure: CATARACT EXTRACTION PHACO AND INTRAOCULAR LENS PLACEMENT (IOC)-LEFT;  Surgeon: Ola Berger, MD;  Location: [facility] OR[facility];  Service: Ophthalmology;  Laterality: Left;  US  00:47.7 CDE 8.92 Fluid Pack lot # [id number]   CHOLECYSTECTOMY  1968   CHONDROPLASTY  Right 01/02/2018   Procedure: CHONDROPLASTY;  Surgeon: Arlyne Lame, MD;  Location: [facility] OR[facility];  Service: Orthopedics;  Laterality: Right;   COLONOSCOPY  2014   CORONARY STENT INTERVENTION N/A 10/19/2021   Procedure: CORONARY STENT INTERVENTION;  Surgeon: Percival Brace, MD;  Location: [facility];  Service: Cardiovascular;  Laterality: N/A;   EYE SURGERY     HERNIA REPAIR Left 04/18/1996   inguinal hernia repair/ Dr Marquita Situ   HERNIA REPAIR Right 02/26/1996   Dr Marquita Situ   HERNIA REPAIR Left 08/07/2002   Dr Marquita Situ   JOINT REPLACEMENT     KNEE ARTHROPLASTY Right 04/30/2020   Procedure: COMPUTER ASSISTED TOTAL KNEE ARTHROPLASTY;  Surgeon: Arlyne Lame, MD;  Location: [facility];  Service: Orthopedics;  Laterality: Right;   KNEE ARTHROSCOPY Right 01/02/2018   Procedure: ARTHROSCOPY KNEE;  Surgeon: Arlyne Lame, MD;  Location: [facility] OR[facility];  Service: Orthopedics;  Laterality: Right;   KNEE ARTHROSCOPY Left 02/14/2012   partial menisectomy and chondroplasty   KNEE ARTHROSCOPY WITH MEDIAL MENISECTOMY Right 01/02/2018   Procedure: KNEE ARTHROSCOPY WITH MEDIAL MENISECTOMY;  Surgeon: Arlyne Lame, MD;  Location: [facility];  Service: Orthopedics;  Laterality: Right;   LEFT HEART CATH AND CORONARY ANGIOGRAPHY N/A 10/19/2021   Procedure: LEFT HEART CATH AND CORONARY ANGIOGRAPHY;  Surgeon:  Percival Brace, MD;  Location: [facility];  Service: Cardiovascular;  Laterality: N/A;   LEFT HEART CATH AND CORONARY ANGIOGRAPHY Left 10/10/2022   Procedure: LEFT HEART CATH AND CORONARY ANGIOGRAPHY;  Surgeon: Percival Brace, MD;  Location: [facility];  Service: Cardiovascular;  Laterality: Left;   TONSILLECTOMY     as a child   TOTAL KNEE ARTHROPLASTY Left 03/29/2015   [facility] Dr. Aubry Blase   VASECTOMY     Patient Active Problem List   Diagnosis Date Noted   Encounter for fitting and adjustment of hearing aid 04/04/2022   Sensorineural hearing loss, bilateral 04/04/2022    Atrial fibrillation (HCC) 04/04/2022   Hypertension 04/04/2022   Gastroesophageal reflux disease without esophagitis    Rheumatoid arthritis (HCC)    NSTEMI (non-ST elevated myocardial infarction) (HCC) 10/18/2021   Total knee replacement status 04/30/2020   Primary osteoarthritis of right knee 03/28/2020   CME (cystoid macular edema), left 07/07/2019   Nonexudative age-related macular degeneration, bilateral, intermediate dry stage 07/07/2019   SOB (shortness of breath) on exertion 08/22/2017   Chest pain with high risk for cardiac etiology 08/22/2017   Recurrent left inguinal hernia 01/04/2017   Allergic rhinitis 06/17/2015   CAD in native artery 06/17/2015   A-fib (HCC) 06/17/2015   Gonalgia 06/17/2015   Polypharmacy 06/17/2015   Cold sore 06/17/2015   Difficulty hearing 06/17/2015   HLD (hyperlipidemia) 06/17/2015   Essential hypertension 06/17/2015   Hypothyroidism 06/17/2015   Meniere's disease 06/17/2015   Buzzing in ear 06/17/2015   Beat, premature ventricular 06/17/2015   Status post total left knee replacement 04/13/2015    ONSET DATE: Beginning ~4-5 years ago, but worsening over the last 1-1.5 years  REFERRING DIAG: M19.041,M19.042 (ICD-10-CM) - Primary osteoarthritis of both hands   THERAPY DIAG:  Muscle weakness (generalized)  Other lack of coordination  Primary osteoarthritis of both hands  Rationale for Evaluation and Treatment: Rehabilitation  SUBJECTIVE:   SUBJECTIVE STATEMENT: Pt reports he's having more difficulty holding on to things d/t inability to fully close his hands.  Pt accompanied by: self  PERTINENT HISTORY: Per medical record from rheumatologist appt on 05/05/24:  Subjective:HPI  Gary Mueller is a 83 y.o. male is here today for follow up of inflammation arthritis, osteoarthritis. The patient's allergies, current medications, past family history, past medical history, past social history, past surgical history and problem list were  reviewed and updated as appropriate.   He is having pain of the left 2nd and 4th PIP joints. He is not able to form a fist. He has poor grip. He does take Tylenol  for pains.  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  -- Continue to monitor of the methotrexate  -- Previous cortisone injection has helped the hand joints  -- Can take Tylenol  for pains as needed  -- Refer to OT for further evaluation  Return in about 6 months (around 11/05/2024) for Routine Follow Up.   PRECAUTIONS: Other: maintain joint protection strategies/cumulative trauma prevention  RED FLAGS: None   WEIGHT BEARING RESTRICTIONS: No  PAIN:  Are you having pain? Yes: NPRS scale: 3-4 in bilat hands at rest, 7-8 with activity  Pain location: bilat hands, but increased tenderness to touch in  the L 4th finger and thumb, R 5th digit (burning)   Pain description: sharp in both hands, burning in the R 5th digit and L thumb CMC  Aggravating factors: activity Relieving factors: elevating, rest, tylenol , heat   FALLS: Has patient fallen in last 6 months? No  LIVING ENVIRONMENT: Lives with: lives with their family, lives with adult son   PLOF: Retired from owning a Occupational [employer]; Currently plays golf a couple days per week (must use built up golf grips and has modified his grip), exercises at the [employer] at [facility] 3-4 times a week, walks dog 2-3x per week.    PATIENT GOALS: "I'd like to be able to use them better, get more movement in my hands."   NEXT MD VISIT: Return to Dr. Lydia Sams in ~6 months, per chart (Rheumatology)  OBJECTIVE:  Note: Objective measures were completed at Evaluation unless otherwise noted.  HAND  DOMINANCE: Right  ADLs: Eating: indep; pt reports no difficulties with cutting food or manipulating eating utensils Grooming: indep Upper body dressing: difficulty managing small buttons on a dress shirt; extra time needed Lower body dressing: pt tends to buy pants without buttons d/t difficulty with fasteners  Toileting: indep Bathing: pt reports that he frequently drops his soap d/t inability to fully close either hand Tub shower transfers: pt does have grab bars in shower, but reports he is still able to grasp bars without difficulty  Equipment: jar opener for kitchen  FUNCTIONAL OUTCOME MEASURES: MAM-20 for musculoskeletal conditions: TBD next session  UPPER EXTREMITY ROM:     Active ROM Right eval Left eval  Thumb MCP (0-60)    Thumb IP (0-80)    Thumb Radial abd/add (0-55)     Thumb Palmar abd/add (0-45)     Thumb Opposition to Small Finger     Index MCP (0-90) 80* flexion  78* flexion  Index PIP (0-100)     Index DIP (0-70)      Long MCP (0-90)      Long PIP (0-100) 20* flexion     Long DIP (0-70) 35* of radial deviation (passively -25* from neutral)     Ring MCP (0-90)      Ring PIP (0-100)      Ring DIP (0-70)      Little MCP (0-90)      Little PIP (0-100)      Little DIP (0-70)      (Blank rows = not tested)  Tip to palm: R LF lacking 3cm  Tip to palm: L IF lacking 2.5 cm, L RF lacking 1.5 cm     Able to oppose all digits to thumb on each hand, but thumb touches medial aspect (not tip) of 5th digit DIP on each hand   UPPER EXTREMITY MMT:     MMT Right eval Left eval  Shoulder flexion    Shoulder abduction    Shoulder adduction    Shoulder extension    Shoulder internal rotation    Shoulder external rotation    Middle trapezius    Lower trapezius    Elbow flexion    Elbow extension    Wrist flexion 5 5  Wrist extension 5 5  Wrist ulnar deviation    Wrist radial deviation    Wrist pronation    Wrist supination    (Blank rows = not  tested)  HAND FUNCTION: Grip strength: Right: 35 lbs; Left: 28 lbs, Lateral pinch: Right: 13 lbs, Left: 11 lbs, and 3 point pinch:  Right: 9 lbs, Left: 8 lbs  COORDINATION: 9 Hole Peg test: Right: 30 sec; Left: 30 sec  SENSATION: WFL  EDEMA:    L RF PIP circumference: 7.1 cm (Pt unable to doff wedding ring from L RF for 2 months now d/t increased swelling in digits) R RF PIP circumference: 6.9 cm  COGNITION: Overall cognitive status: Within functional limits for tasks assessed  OBSERVATIONS:  Pt pleasant/cooperative, and appears eager to improve functional use of hands with trial of OT.  TREATMENT DATE: 05/13/24: Evaluation completed.                                                                                                                            Self Care: Education provided on condition management, poc options related to management of OA  PATIENT EDUCATION: Education details: OT role, goals, poc, condition management Person educated: Patient Education method: Explanation and Verbal cues Education comprehension: verbalized understanding  HOME EXERCISE PROGRAM: Not yet initiated  GOALS: Goals reviewed with patient? Yes  SHORT TERM GOALS: Target date: 06/24/24  Pt will be indep to perform HEP for improving bilat hand flexibility, strength, and coordination. Baseline: Eval: HEP not yet initiated Goal status: INITIAL  2.  Pt will be indep to verbalize 2-3 joint protection/cumulative trauma prevention strategies to reduce pain in R/L hands.  Baseline: Eval: Education not yet initiated Goal status: INITIAL  3.  Pt will be indep to verbalize and implement 1-2 pain management strategies to reduce pain in bilat hands.  Baseline: Eval: Education not yet initiated Goal status: INITIAL  4.  Pt will be indep to monitor skin integrity of R LF while donning/doffing finger splint in order to reduce risk of skin breakdown. Baseline: Eval: Splint not yet issued/education not  yet initiated Goal status: INITIAL  LONG TERM GOALS: Target date: 08/05/24  Pt will increase MAM-20 (for musculoskeletal conditions) score by 9 or more points in order to improve pt's perceived functional use of bilat hands with daily tasks. Baseline: Eval: MAM-20 to be given next session Goal status: INITIAL  2.  Pt will increase bilat grip strength by 10 or more lbs in each hand to ease ability to open containers. Baseline: Eval: R 35 lbs, L 28 lbs (below age range norms) Goal status: INITIAL  3.  Pt will increase lateral pinch strength by 3 lbs or more in each hand to ease ability to open food packages.  Baseline: Eval: R 13 lbs, L 11 lbs Goal status: INITIAL  4.  Pt will increase bilat hand flexibility to enable ability to formulate a fully closed fist to reduce frequency of dropping smaller ADL supplies in either hand. Baseline: Eval: (Tip to palm) R LF lacking 3cm, L IF lacking 2.5 cm, L RF lacking 1.5 cm Goal status: INITIAL  5.  Pt will follow recommended wearing schedule of oval 8 splint with at least 75% adherence to reduce radial deviation in R LF DIP joint and reduce  risk of worsening hand deformity in R dominant hand. Baseline: Eval: Splint not yet issued; Pt presents with 35* of radial deviation on R LF DIP joint (able to passively reduce to 25*) Goal status: INITIAL  6.  Pt will tolerate manual therapy, therapeutic modalities, and exercises to decrease pain in bilat hands to a reported 3/10 pain or less with daily activities.   Baseline: Eval: 3-4/10 pain in bilat hands at rest, 7-8/10 pain with activity Goal status: INITIAL  7.  Pt will tolerate and implement edema management strategies to reduce edema in bilat hands to allow doffing/donning wedding ring. Baseline: Pt reports he's been unable to doff wedding ring for ~2 months, but previously donned/doffed ring daily; L RF PIP joint circumference 7.1 cm, R 6.9 cm Goal status: INITIAL    ASSESSMENT:  CLINICAL  IMPRESSION: Patient is an 83 y.o. male who was seen today for occupational therapy evaluation for functional decline related to worsening OA in bilat hands.  Pt presents with mild-moderate pain levels in each hand at rest, severe pain levels in each hand with activity, increased joint swelling to the point of inability to doff wedding ring over the last 2 months, weak grip strength in bilat hands (below norms for age range) making it difficult to open containers and carry heavy ADL supplies, and reportedly struggling with dropping smaller ADL supplies from hands d/t inability to fully formulate a fist d/t joint stiffness in MP, PIP, DIP joints in all digits of each hand.  Pt will benefit from skilled OT to address above noted deficits in order to improve indep and tolerance to ADL/IADL tasks.    PERFORMANCE DEFICITS: in functional skills including ADLs, IADLs, coordination, dexterity, edema, ROM, strength, pain, flexibility, Fine motor control, body mechanics, decreased knowledge of precautions, decreased knowledge of use of DME, and UE functional use, and psychosocial skills including coping strategies, environmental adaptation, habits, and routines and behaviors.   IMPAIRMENTS: are limiting patient from ADLs, IADLs, and leisure.   COMORBIDITIES: has co-morbidities such as HTN, Afib, hx of Nstemi, HLD that affects occupational performance. Patient will benefit from skilled OT to address above impairments and improve overall function.  MODIFICATION OR ASSISTANCE TO COMPLETE EVALUATION: No modification of tasks or assist necessary to complete an evaluation.  OT OCCUPATIONAL PROFILE AND HISTORY: Detailed assessment: Review of records and additional review of physical, cognitive, psychosocial history related to current functional performance.  CLINICAL DECISION MAKING: Moderate - several treatment options, min-mod task modification necessary  REHAB POTENTIAL: Good  EVALUATION COMPLEXITY:  Moderate      PLAN:  OT FREQUENCY: 2x/week  OT DURATION: 12 weeks  PLANNED INTERVENTIONS: 97168 OT Re-evaluation, 97535 self care/ADL training, 16109 therapeutic exercise, 97530 therapeutic activity, 97140 manual therapy, 97018 paraffin, 60454 moist heat, 97010 cryotherapy, 97034 contrast bath, 97760 Orthotic Initial, 97763 Orthotic/Prosthetic subsequent, passive range of motion, coping strategies training, patient/family education, and DME and/or AE instructions  RECOMMENDED OTHER SERVICES: None at this time  CONSULTED AND AGREED WITH PLAN OF CARE: Patient  PLAN FOR NEXT SESSION: Issue MAM-20 for musculoskeletal conditions, initiate HEP and edema management strategies  Marcus Sewer, MS, OTR/L  Casandra Claw, OT 05/14/2024, 1:26 PM

## 2024-05-15 ENCOUNTER — Ambulatory Visit

## 2024-05-15 DIAGNOSIS — M19041 Primary osteoarthritis, right hand: Secondary | ICD-10-CM

## 2024-05-15 DIAGNOSIS — R278 Other lack of coordination: Secondary | ICD-10-CM

## 2024-05-15 DIAGNOSIS — M6281 Muscle weakness (generalized): Secondary | ICD-10-CM

## 2024-05-15 NOTE — Therapy (Signed)
 OUTPATIENT OCCUPATIONAL THERAPY ORTHO TREATMENT NOTE  Patient Name: Gary Mueller MRN: [id number] DOB:August 04, 1941, 83 y.o., male Today's Date: 05/16/2024  PCP: Dr. Eddy Goodell ([facility]) REFERRING PROVIDER: Adolphus Akin, MD (Rheumatology)  END OF SESSION:   OT End of Session - 05/16/24 1238     Visit Number 2    Number of Visits 24    Date for OT Re-Evaluation 08/05/24    OT Start Time 1400    OT Stop Time 1445    OT Time Calculation (min) 45 min    Activity Tolerance Patient tolerated treatment well    Behavior During Therapy [facility] for tasks assessed/performed            Past Medical History:  Diagnosis Date   Allergy    Anxiety    Arthritis    Cancer (HCC) 10/20/2016   Basal Cell Skin Cancer; lip, neck   Cystoid macular edema of left eye 07/07/2019   Depression    Dysrhythmia    A FIB   GERD (gastroesophageal reflux disease)    HOH (hard of hearing)    Bilateral   Hyperlipidemia    Hypertension    Patient denies   Hypothyroidism    IBS (irritable bowel syndrome)    Inguinal hernia    bilateral   Pneumonia    Thyroid  disease    Tinnitus of both ears    Past Surgical History:  Procedure Laterality Date   ANTERIOR VITRECTOMY Left 11/28/2018   Procedure: ANTERIOR VITRECTOMY;  Surgeon: Ola Berger, MD;  Location: [facility] OR[facility];  Service: Ophthalmology;  Laterality: Left;   CARDIAC CATHETERIZATION     CATARACT EXTRACTION W/PHACO Right 10/10/2018   Procedure: CATARACT EXTRACTION PHACO AND INTRAOCULAR LENS PLACEMENT (IOC);  Surgeon: Ola Berger, MD;  Location: [facility];  Service: Ophthalmology;  Laterality: Right;  US  00:41 CDE 6.87 Fluid pack lot # [id number]   CATARACT EXTRACTION W/PHACO Left 11/28/2018   Procedure: CATARACT EXTRACTION PHACO AND INTRAOCULAR LENS PLACEMENT (IOC)-LEFT;  Surgeon: Ola Berger, MD;  Location: [facility];  Service: Ophthalmology;  Laterality: Left;  US  00:47.7 CDE 8.92 Fluid Pack lot # [id number]   CHOLECYSTECTOMY  1968   CHONDROPLASTY  Right 01/02/2018   Procedure: CHONDROPLASTY;  Surgeon: Arlyne Lame, MD;  Location: [facility];  Service: Orthopedics;  Laterality: Right;   COLONOSCOPY  2014   CORONARY STENT INTERVENTION N/A 10/19/2021   Procedure: CORONARY STENT INTERVENTION;  Surgeon: Percival Brace, MD;  Location: [facility];  Service: Cardiovascular;  Laterality: N/A;   EYE SURGERY     HERNIA REPAIR Left 04/18/1996   inguinal hernia repair/ Dr Marquita Situ   HERNIA REPAIR Right 02/26/1996   Dr Marquita Situ   HERNIA REPAIR Left 08/07/2002   Dr Marquita Situ   JOINT REPLACEMENT     KNEE ARTHROPLASTY Right 04/30/2020   Procedure: COMPUTER ASSISTED TOTAL KNEE ARTHROPLASTY;  Surgeon: Arlyne Lame, MD;  Location: [facility];  Service: Orthopedics;  Laterality: Right;   KNEE ARTHROSCOPY Right 01/02/2018   Procedure: ARTHROSCOPY KNEE;  Surgeon: Arlyne Lame, MD;  Location: [facility];  Service: Orthopedics;  Laterality: Right;   KNEE ARTHROSCOPY Left 02/14/2012   partial menisectomy and chondroplasty   KNEE ARTHROSCOPY WITH MEDIAL MENISECTOMY Right 01/02/2018   Procedure: KNEE ARTHROSCOPY WITH MEDIAL MENISECTOMY;  Surgeon: Arlyne Lame, MD;  Location: [facility];  Service: Orthopedics;  Laterality: Right;   LEFT HEART CATH AND CORONARY ANGIOGRAPHY N/A 10/19/2021   Procedure: LEFT HEART CATH AND CORONARY ANGIOGRAPHY;  Surgeon:  Percival Brace, MD;  Location: [facility];  Service: Cardiovascular;  Laterality: N/A;   LEFT HEART CATH AND CORONARY ANGIOGRAPHY Left 10/10/2022   Procedure: LEFT HEART CATH AND CORONARY ANGIOGRAPHY;  Surgeon: Percival Brace, MD;  Location: [facility];  Service: Cardiovascular;  Laterality: Left;   TONSILLECTOMY     as a child   TOTAL KNEE ARTHROPLASTY Left 03/29/2015   [facility] Dr. Aubry Blase   VASECTOMY     Patient Active Problem List   Diagnosis Date Noted   Encounter for fitting and adjustment of hearing aid 04/04/2022   Sensorineural hearing loss, bilateral 04/04/2022    Atrial fibrillation (HCC) 04/04/2022   Hypertension 04/04/2022   Gastroesophageal reflux disease without esophagitis    Rheumatoid arthritis (HCC)    NSTEMI (non-ST elevated myocardial infarction) (HCC) 10/18/2021   Total knee replacement status 04/30/2020   Primary osteoarthritis of right knee 03/28/2020   CME (cystoid macular edema), left 07/07/2019   Nonexudative age-related macular degeneration, bilateral, intermediate dry stage 07/07/2019   SOB (shortness of breath) on exertion 08/22/2017   Chest pain with high risk for cardiac etiology 08/22/2017   Recurrent left inguinal hernia 01/04/2017   Allergic rhinitis 06/17/2015   CAD in native artery 06/17/2015   A-fib (HCC) 06/17/2015   Gonalgia 06/17/2015   Polypharmacy 06/17/2015   Cold sore 06/17/2015   Difficulty hearing 06/17/2015   HLD (hyperlipidemia) 06/17/2015   Essential hypertension 06/17/2015   Hypothyroidism 06/17/2015   Meniere's disease 06/17/2015   Buzzing in ear 06/17/2015   Beat, premature ventricular 06/17/2015   Status post total left knee replacement 04/13/2015   ONSET DATE: Beginning ~4-5 years ago, but worsening over the last 1-1.5 years  REFERRING DIAG: M19.041,M19.042 (ICD-10-CM) - Primary osteoarthritis of both hands   THERAPY DIAG:  Muscle weakness (generalized)  Other lack of coordination  Primary osteoarthritis of both hands  Rationale for Evaluation and Treatment: Rehabilitation  SUBJECTIVE:  SUBJECTIVE STATEMENT: Pt reports doing well today. Pt accompanied by: self  PERTINENT HISTORY: Per medical record from rheumatologist appt on 05/05/24:  Subjective:HPI  Gary Mueller is a 83 y.o. male is here today for follow up of inflammation arthritis, osteoarthritis. The patient's allergies, current medications, past family history, past medical history, past social history, past surgical history and problem list were reviewed and updated as appropriate.   He is having pain of the left 2nd and  4th PIP joints. He is not able to form a fist. He has poor grip. He does take Tylenol  for pains.  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  -- Continue to monitor of the methotrexate  -- Previous cortisone injection has helped the hand joints  -- Can take Tylenol  for pains as needed  -- Refer to OT for further evaluation  Return in about 6 months (around 11/05/2024) for Routine Follow Up.   PRECAUTIONS: Other: maintain joint protection strategies/cumulative trauma prevention  RED FLAGS: None   WEIGHT BEARING RESTRICTIONS: No  PAIN: 05/15/24: 4-5/10 in bilat hands, but 10/10 pain in L RF with activity  Are you having pain? Yes: NPRS scale: 3-4 in bilat hands at rest, 7-8 with activity  Pain location: bilat hands, but increased tenderness to touch in the L  4th finger and thumb, R 5th digit (burning)   Pain description: sharp in both hands, burning in the R 5th digit and L thumb CMC  Aggravating factors: activity Relieving factors: elevating, rest, tylenol , heat   FALLS: Has patient fallen in last 6 months? No  LIVING ENVIRONMENT: Lives with: lives with their family, lives with adult son   PLOF: Retired from owning a Occupational [employer]; Currently plays golf a couple days per week (must use built up golf grips and has modified his grip), exercises at the [employer] at [facility] 3-4 times a week, walks dog 2-3x per week.    PATIENT GOALS: "I'd like to be able to use them better, get more movement in my hands."   NEXT MD VISIT: Return to Dr. Lydia Sams in ~6 months, per chart (Rheumatology)  OBJECTIVE:  Note: Objective measures were completed at Evaluation unless otherwise noted.  HAND DOMINANCE:  Right  ADLs: Eating: indep; pt reports no difficulties with cutting food or manipulating eating utensils Grooming: indep Upper body dressing: difficulty managing small buttons on a dress shirt; extra time needed Lower body dressing: pt tends to buy pants without buttons d/t difficulty with fasteners  Toileting: indep Bathing: pt reports that he frequently drops his soap d/t inability to fully close either hand Tub shower transfers: pt does have grab bars in shower, but reports he is still able to grasp bars without difficulty  Equipment: jar opener for kitchen  FUNCTIONAL OUTCOME MEASURES: MAM-20 for musculoskeletal conditions: TBD next session 05/15/24: 60/80  UPPER EXTREMITY ROM:     Active ROM Right eval Left eval  Thumb MCP (0-60)    Thumb IP (0-80)    Thumb Radial abd/add (0-55)     Thumb Palmar abd/add (0-45)     Thumb Opposition to Small Finger     Index MCP (0-90) 80* flexion  78* flexion  Index PIP (0-100)     Index DIP (0-70)      Long MCP (0-90)      Long PIP (0-100) 20* flexion     Long DIP (0-70) 35* of radial deviation (passively -25* from neutral)     Ring MCP (0-90)      Ring PIP (0-100)      Ring DIP (0-70)      Little MCP (0-90)      Little PIP (0-100)      Little DIP (0-70)      (Blank rows = not tested)  Tip to palm: R LF lacking 3cm  Tip to palm: L IF lacking 2.5 cm, L RF lacking 1.5 cm     Able to oppose all digits to thumb on each hand, but thumb touches medial aspect (not tip) of 5th digit DIP on each hand   UPPER EXTREMITY MMT:     MMT Right eval Left eval  Shoulder flexion    Shoulder abduction    Shoulder adduction    Shoulder extension    Shoulder internal rotation    Shoulder external rotation    Middle trapezius    Lower trapezius    Elbow flexion    Elbow extension    Wrist flexion 5 5  Wrist extension 5 5  Wrist ulnar deviation    Wrist radial deviation    Wrist pronation    Wrist supination    (Blank rows = not  tested)  HAND FUNCTION: Grip strength: Right: 35 lbs; Left: 28 lbs, Lateral pinch: Right: 13 lbs, Left: 11 lbs, and 3 point pinch:  Right: 9 lbs, Left: 8 lbs  COORDINATION: 9 Hole Peg test: Right: 30 sec; Left: 30 sec  SENSATION: WFL  EDEMA:    L RF PIP circumference: 7.1 cm (Pt unable to doff wedding ring from L RF for 2 months now d/t increased swelling in digits) R RF PIP circumference: 6.9 cm  COGNITION: Overall cognitive status: Within functional limits for tasks assessed  OBSERVATIONS:  Pt pleasant/cooperative, and appears eager to improve functional use of hands with trial of OT.  TREATMENT DATE: 05/15/24: Moist heat used intermittently throughout session to manage pain and promote muscle relaxation for activities noted below.  Therapeutic Activity: -Assessed fit of various oval 8 ring sizes to reduce radial deviation of R LF DIP joint -Applied kinesiotape in place of oval 8 splint, anchoring first end to radial side of base of LF, extending overtop fingertip with 25-50% stretch, anchoring second end to ulnar side of base of LF to reduce radial deviation.  Advised on removing tape when tape begins to roll or if stretch becomes intolerable.    Therapeutic Exercise: -Instructed in gentle self passive stretching in place of Kinesiotape to reduce R LF DIP radial deviation; pt able to return demo with good tolerance. -Issued yellow theraputty and instructed pt in strengthening/coordination exercises for R/L hands (to tolerance), including gross grasping, lateral/2 point/3 point pinching, digit abd/add, and digging coins out of putty.  Able to return demo with intermittent mod vc for technique to improve quality of movement.  Encouraged completion 5-10 min, 1-2x per day and to be mindful of any increases in pain.  Reinforced importance of low reps to avoid pain from overuse.     Manual Therapy: Performed edema massage to L RF and instructed in self edema massage technique.                                                                                                                             PATIENT EDUCATION: Education details: Edema massage technique, HEP Person educated: Patient Education method: Explanation and Verbal cues, demo, visual handout for HEP Education comprehension: verbalized understanding, further training needed  HOME EXERCISE PROGRAM: Yellow theraband; edema massage to digits in either hand  GOALS: Goals reviewed with patient? Yes  SHORT TERM GOALS: Target date: 06/24/24  Pt will be indep to perform HEP for improving bilat hand flexibility, strength, and coordination. Baseline: Eval: HEP not yet initiated Goal status: INITIAL  2.  Pt will be indep to verbalize 2-3 joint protection/cumulative trauma prevention strategies to reduce pain in R/L hands.  Baseline: Eval: Education not yet initiated Goal status: INITIAL  3.  Pt will be indep to verbalize and implement 1-2 pain management strategies to reduce pain in bilat hands.  Baseline: Eval: Education not yet initiated Goal status: INITIAL  4.  Pt will be indep to monitor skin integrity of R LF while donning/doffing finger splint in order to reduce risk of skin breakdown. Baseline: Eval: Splint not yet issued/education  not yet initiated Goal status: INITIAL  LONG TERM GOALS: Target date: 08/05/24  Pt will increase MAM-20 (for musculoskeletal conditions) score by 9 or more points in order to improve pt's perceived functional use of bilat hands with daily tasks. Baseline: Eval: MAM-20 to be given next session Goal status: INITIAL  2.  Pt will increase bilat grip strength by 10 or more lbs in each hand to ease ability to open containers. Baseline: Eval: R 35 lbs, L 28 lbs (below age range norms) Goal status: INITIAL  3.  Pt will increase lateral pinch strength by 3 lbs or more in each hand to ease ability to open food packages.  Baseline: Eval: R 13 lbs, L 11 lbs Goal status: INITIAL  4.   Pt will increase bilat hand flexibility to enable ability to formulate a fully closed fist to reduce frequency of dropping smaller ADL supplies in either hand. Baseline: Eval: (Tip to palm) R LF lacking 3cm, L IF lacking 2.5 cm, L RF lacking 1.5 cm Goal status: INITIAL  5.  Pt will follow recommended wearing schedule of oval 8 splint with at least 75% adherence to reduce radial deviation in R LF DIP joint and reduce  risk of worsening hand deformity in R dominant hand. Baseline: Eval: Splint not yet issued; Pt presents with 35* of radial deviation on R LF DIP joint (able to passively reduce to 25*) Goal status: INITIAL  6.  Pt will tolerate manual therapy, therapeutic modalities, and exercises to decrease pain in bilat hands to a reported 3/10 pain or less with daily activities.   Baseline: Eval: 3-4/10 pain in bilat hands at rest, 7-8/10 pain with activity Goal status: INITIAL  7.  Pt will tolerate and implement edema management strategies to reduce edema in bilat hands to allow doffing/donning wedding ring. Baseline: Pt reports he's been unable to doff wedding ring for ~2 months, but previously donned/doffed ring daily; L RF PIP joint circumference 7.1 cm, R 6.9 cm Goal status: INITIAL    ASSESSMENT:  CLINICAL IMPRESSION: Pt reporting severe pain levels in L RF this date, and 4-5/10 pain with activity in all remaining digits in either hand, though reported pain was reduced with moist heat and gentle/low reps of yellow theraputty.  OT able to successfully remove pt's wedding ring from L RF following edema massage to this digit.  OT donned ring again and encouraged pt to continue with self edema massage for easier donning/doffing.  Pt verbalized understanding.  Pt will continue to benefit from skilled OT to address pain, stiffness, and weakness in bilat hands in order to improve indep and tolerance to ADL/IADL tasks.    PERFORMANCE DEFICITS: in functional skills including ADLs, IADLs,  coordination, dexterity, edema, ROM, strength, pain, flexibility, Fine motor control, body mechanics, decreased knowledge of precautions, decreased knowledge of use of DME, and UE functional use, and psychosocial skills including coping strategies, environmental adaptation, habits, and routines and behaviors.   IMPAIRMENTS: are limiting patient from ADLs, IADLs, and leisure.   COMORBIDITIES: has co-morbidities such as HTN, Afib, hx of Nstemi, HLD that affects occupational performance. Patient will benefit from skilled OT to address above impairments and improve overall function.  MODIFICATION OR ASSISTANCE TO COMPLETE EVALUATION: No modification of tasks or assist necessary to complete an evaluation.  OT OCCUPATIONAL PROFILE AND HISTORY: Detailed assessment: Review of records and additional review of physical, cognitive, psychosocial history related to current functional performance.  CLINICAL DECISION MAKING: Moderate - several treatment options, min-mod task modification  necessary  REHAB POTENTIAL: Good  EVALUATION COMPLEXITY: Moderate      PLAN:  OT FREQUENCY: 2x/week  OT DURATION: 12 weeks  PLANNED INTERVENTIONS: 97168 OT Re-evaluation, 97535 self care/ADL training, 16109 therapeutic exercise, 97530 therapeutic activity, 97140 manual therapy, 97018 paraffin, 60454 moist heat, 97010 cryotherapy, 97034 contrast bath, 97760 Orthotic Initial, 97763 Orthotic/Prosthetic subsequent, passive range of motion, coping strategies training, patient/family education, and DME and/or AE instructions  RECOMMENDED OTHER SERVICES: None at this time  CONSULTED AND AGREED WITH PLAN OF CARE: Patient  PLAN FOR NEXT SESSION: see above  Marcus Sewer, MS, OTR/L  Casandra Claw, OT 05/16/2024, 12:40 PM

## 2024-05-20 ENCOUNTER — Ambulatory Visit

## 2024-05-20 DIAGNOSIS — M19041 Primary osteoarthritis, right hand: Secondary | ICD-10-CM

## 2024-05-20 DIAGNOSIS — M6281 Muscle weakness (generalized): Secondary | ICD-10-CM

## 2024-05-20 DIAGNOSIS — R278 Other lack of coordination: Secondary | ICD-10-CM

## 2024-05-20 NOTE — Therapy (Unsigned)
 OUTPATIENT OCCUPATIONAL THERAPY ORTHO TREATMENT NOTE  Patient Name: Gary Mueller MRN: [id number] DOB:September 06, 1941, 83 y.o., male Today's Date: 05/22/2024  PCP: Dr. Eddy Goodell ([facility]) REFERRING PROVIDER: Adolphus Akin, MD (Rheumatology)  END OF SESSION:   OT End of Session - 05/22/24 1337     Visit Number 3    Number of Visits 24    Date for OT Re-Evaluation 08/05/24    OT Start Time 1100    OT Stop Time 1145    OT Time Calculation (min) 45 min    Activity Tolerance Patient tolerated treatment well    Behavior During Therapy [facility] for tasks assessed/performed            Past Medical History:  Diagnosis Date   Allergy    Anxiety    Arthritis    Cancer (HCC) 10/20/2016   Basal Cell Skin Cancer; lip, neck   Cystoid macular edema of left eye 07/07/2019   Depression    Dysrhythmia    A FIB   GERD (gastroesophageal reflux disease)    HOH (hard of hearing)    Bilateral   Hyperlipidemia    Hypertension    Patient denies   Hypothyroidism    IBS (irritable bowel syndrome)    Inguinal hernia    bilateral   Pneumonia    Thyroid  disease    Tinnitus of both ears    Past Surgical History:  Procedure Laterality Date   ANTERIOR VITRECTOMY Left 11/28/2018   Procedure: ANTERIOR VITRECTOMY;  Surgeon: Ola Berger, MD;  Location: [facility] ORS;  Service: Ophthalmology;  Laterality: Left;   CARDIAC CATHETERIZATION     CATARACT EXTRACTION W/PHACO Right 10/10/2018   Procedure: CATARACT EXTRACTION PHACO AND INTRAOCULAR LENS PLACEMENT (IOC);  Surgeon: Ola Berger, MD;  Location: [facility] ORS;  Service: Ophthalmology;  Laterality: Right;  US  00:41 CDE 6.87 Fluid pack lot # [id number]   CATARACT EXTRACTION W/PHACO Left 11/28/2018   Procedure: CATARACT EXTRACTION PHACO AND INTRAOCULAR LENS PLACEMENT (IOC)-LEFT;  Surgeon: Ola Berger, MD;  Location: [facility] OR[facility];  Service: Ophthalmology;  Laterality: Left;  US  00:47.7 CDE 8.92 Fluid Pack lot # [id number]   CHOLECYSTECTOMY  1968   CHONDROPLASTY  Right 01/02/2018   Procedure: CHONDROPLASTY;  Surgeon: Arlyne Lame, MD;  Location: [facility] OR[facility];  Service: Orthopedics;  Laterality: Right;   COLONOSCOPY  2014   CORONARY STENT INTERVENTION N/A 10/19/2021   Procedure: CORONARY STENT INTERVENTION;  Surgeon: Percival Brace, MD;  Location: [facility] IN[facility]IVE [facility];  Service: Cardiovascular;  Laterality: N/A;   EYE SURGERY     HERNIA REPAIR Left 04/18/1996   inguinal hernia repair/ Dr Marquita Situ   HERNIA REPAIR Right 02/26/1996   Dr Marquita Situ   HERNIA REPAIR Left 08/07/2002   Dr Marquita Situ   JOINT REPLACEMENT     KNEE ARTHROPLASTY Right 04/30/2020   Procedure: COMPUTER ASSISTED TOTAL KNEE ARTHROPLASTY;  Surgeon: Arlyne Lame, MD;  Location: [facility] OR[facility];  Service: Orthopedics;  Laterality: Right;   KNEE ARTHROSCOPY Right 01/02/2018   Procedure: ARTHROSCOPY KNEE;  Surgeon: Arlyne Lame, MD;  Location: [facility] OR[facility];  Service: Orthopedics;  Laterality: Right;   KNEE ARTHROSCOPY Left 02/14/2012   partial menisectomy and chondroplasty   KNEE ARTHROSCOPY WITH MEDIAL MENISECTOMY Right 01/02/2018   Procedure: KNEE ARTHROSCOPY WITH MEDIAL MENISECTOMY;  Surgeon: Arlyne Lame, MD;  Location: [facility];  Service: Orthopedics;  Laterality: Right;   LEFT HEART CATH AND CORONARY ANGIOGRAPHY N/A 10/19/2021   Procedure: LEFT HEART CATH AND CORONARY ANGIOGRAPHY;  Surgeon:  Percival Brace, MD;  Location: [facility];  Service: Cardiovascular;  Laterality: N/A;   LEFT HEART CATH AND CORONARY ANGIOGRAPHY Left 10/10/2022   Procedure: LEFT HEART CATH AND CORONARY ANGIOGRAPHY;  Surgeon: Percival Brace, MD;  Location: [facility];  Service: Cardiovascular;  Laterality: Left;   TONSILLECTOMY     as a child   TOTAL KNEE ARTHROPLASTY Left 03/29/2015   [facility] Dr. Aubry Blase   VASECTOMY     Patient Active Problem List   Diagnosis Date Noted   Encounter for fitting and adjustment of hearing aid 04/04/2022   Sensorineural hearing loss, bilateral 04/04/2022    Atrial fibrillation (HCC) 04/04/2022   Hypertension 04/04/2022   Gastroesophageal reflux disease without esophagitis    Rheumatoid arthritis (HCC)    NSTEMI (non-ST elevated myocardial infarction) (HCC) 10/18/2021   Total knee replacement status 04/30/2020   Primary osteoarthritis of right knee 03/28/2020   CME (cystoid macular edema), left 07/07/2019   Nonexudative age-related macular degeneration, bilateral, intermediate dry stage 07/07/2019   SOB (shortness of breath) on exertion 08/22/2017   Chest pain with high risk for cardiac etiology 08/22/2017   Recurrent left inguinal hernia 01/04/2017   Allergic rhinitis 06/17/2015   CAD in native artery 06/17/2015   A-fib (HCC) 06/17/2015   Gonalgia 06/17/2015   Polypharmacy 06/17/2015   Cold sore 06/17/2015   Difficulty hearing 06/17/2015   HLD (hyperlipidemia) 06/17/2015   Essential hypertension 06/17/2015   Hypothyroidism 06/17/2015   Meniere's disease 06/17/2015   Buzzing in ear 06/17/2015   Beat, premature ventricular 06/17/2015   Status post total left knee replacement 04/13/2015   ONSET DATE: Beginning ~4-5 years ago, but worsening over the last 1-1.5 years  REFERRING DIAG: M19.041,M19.042 (ICD-10-CM) - Primary osteoarthritis of both hands   THERAPY DIAG:  Muscle weakness (generalized)  Other lack of coordination  Primary osteoarthritis of both hands  Rationale for Evaluation and Treatment: Rehabilitation  SUBJECTIVE:  SUBJECTIVE STATEMENT: Pt reports L 4th digit pain was 10/10 last night, but improved today. Pt accompanied by: self  PERTINENT HISTORY: Per medical record from rheumatologist appt on 05/05/24:  Subjective:HPI  Gary Mueller is a 83 y.o. male is here today for follow up of inflammation arthritis, osteoarthritis. The patient's allergies, current medications, past family history, past medical history, past social history, past surgical history and problem list were reviewed and updated as appropriate.    He is having pain of the left 2nd and 4th PIP joints. He is not able to form a fist. He has poor grip. He does take Tylenol  for pains.  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  -- Continue to monitor of the methotrexate  -- Previous cortisone injection has helped the hand joints  -- Can take Tylenol  for pains as needed  -- Refer to OT for further evaluation  Return in about 6 months (around 11/05/2024) for Routine Follow Up.   PRECAUTIONS: Other: maintain joint protection strategies/cumulative trauma prevention  RED FLAGS: None   WEIGHT BEARING RESTRICTIONS: No  PAIN: 05/20/24: 4-5/10 in bilat hands today, but 10/10 pain in L RF with activity last night  Are you having pain? Yes: NPRS scale: 3-4 in bilat hands at rest, 7-8 with activity  Pain  location: bilat hands, but increased tenderness to touch in the L 4th finger and thumb, R 5th digit (burning)   Pain description: sharp in both hands, burning in the R 5th digit and L thumb CMC  Aggravating factors: activity Relieving factors: elevating, rest, tylenol , heat   FALLS: Has patient fallen in last 6 months? No  LIVING ENVIRONMENT: Lives with: lives with their family, lives with adult son   PLOF: Retired from owning a Occupational psychologist; Currently plays golf a couple days per week (must use built up golf grips and has modified his grip), exercises at the [employer] at [facility] 3-4 times a week, walks dog 2-3x per week.    PATIENT GOALS: "I'd like to be able to use them better, get more movement in my hands."   NEXT MD VISIT: Return to Dr. Lydia Sams in ~6 months, per chart (Rheumatology)  OBJECTIVE:  Note: Objective measures were completed at  Evaluation unless otherwise noted.  HAND DOMINANCE: Right  ADLs: Eating: indep; pt reports no difficulties with cutting food or manipulating eating utensils Grooming: indep Upper body dressing: difficulty managing small buttons on a dress shirt; extra time needed Lower body dressing: pt tends to buy pants without buttons d/t difficulty with fasteners  Toileting: indep Bathing: pt reports that he frequently drops his soap d/t inability to fully close either hand Tub shower transfers: pt does have grab bars in shower, but reports he is still able to grasp bars without difficulty  Equipment: jar opener for kitchen  FUNCTIONAL OUTCOME MEASURES: MAM-20 for musculoskeletal conditions: TBD next session 05/15/24: 60/80  UPPER EXTREMITY ROM:     Active ROM Right eval Left eval  Thumb MCP (0-60)    Thumb IP (0-80)    Thumb Radial abd/add (0-55)     Thumb Palmar abd/add (0-45)     Thumb Opposition to Small Finger     Index MCP (0-90) 80* flexion  78* flexion  Index PIP (0-100)     Index DIP (0-70)      Long MCP (0-90)      Long PIP (0-100) 20* flexion     Long DIP (0-70) 35* of radial deviation (passively -25* from neutral)     Ring MCP (0-90)      Ring PIP (0-100)      Ring DIP (0-70)      Little MCP (0-90)      Little PIP (0-100)      Little DIP (0-70)      (Blank rows = not tested)  Tip to palm: R LF lacking 3cm  Tip to palm: L IF lacking 2.5 cm, L RF lacking 1.5 cm     Able to oppose all digits to thumb on each hand, but thumb touches medial aspect (not tip) of 5th digit DIP on each hand   UPPER EXTREMITY MMT:     MMT Right eval Left eval  Shoulder flexion    Shoulder abduction    Shoulder adduction    Shoulder extension    Shoulder internal rotation    Shoulder external rotation    Middle trapezius    Lower trapezius    Elbow flexion    Elbow extension    Wrist flexion 5 5  Wrist extension 5 5  Wrist ulnar deviation    Wrist radial deviation    Wrist  pronation    Wrist supination    (Blank rows = not tested)  HAND FUNCTION: Grip strength: Right: 35 lbs; Left: 28 lbs, Lateral  pinch: Right: 13 lbs, Left: 11 lbs, and 3 point pinch: Right: 9 lbs, Left: 8 lbs  COORDINATION: 9 Hole Peg test: Right: 30 sec; Left: 30 sec  SENSATION: WFL  EDEMA:    L RF PIP circumference: 7.1 cm (Pt unable to doff wedding ring from L RF for 2 months now d/t increased swelling in digits) R RF PIP circumference: 6.9 cm  COGNITION: Overall cognitive status: Within functional limits for tasks assessed  OBSERVATIONS:  Pt pleasant/cooperative, and appears eager to improve functional use of hands with trial of OT.  TREATMENT DATE: 05/20/24: Moist heat used intermittently throughout session to manage pain and promote muscle relaxation for improving digit ROM in bilat hands.  Therapeutic Activity: -Applied kinesiotape to R LF, anchoring first end to radial side of base of LF, extending overtop fingertip with 50% stretch, anchoring second end to ulnar side of base of LF to reduce radial deviation at the DIP.   -Instructed pt in self application strategies for Ktape to this digit for carryover at home, and reinforced need to continue to monitor for tolerance/remove if gentle stretch becomes uncomfortable/painful.  Manual Therapy: Performed edema massage to L RF and reviewed self edema massage technique. -Performed edema massage and soft tissue massage to MP, PIP, DIP joints in each digit of R/L hands, working to reduce pain, swelling to improve digit ROM for functional activities.   Self Care: -Sized pt and issued R/L open fingered compression gloves to for pain/edema management; advised on indications for wearing -Pt reports ordering oval 8 splints; Encouraged pt to bring in his oval 8 splints once delivered and to avoid wearing until OT can assess for proper fit, as splints donned in clinic were likely to contribute to skin breakdown d/t extent of DIP radial  deviation.  Pt verbalized understanding.                                                                                                                              PATIENT EDUCATION: Education details: Education officer, museum of Ktape to reduce R LF DIP radial deviation Person educated: Patient Education method: Explanation, Demonstration, and Verbal cues Education comprehension: verbalized understanding, further training needed  HOME EXERCISE PROGRAM: Yellow theraband; edema massage to digits in either hand  GOALS: Goals reviewed with patient? Yes  SHORT TERM GOALS: Target date: 06/24/24  Pt will be indep to perform HEP for improving bilat hand flexibility, strength, and coordination. Baseline: Eval: HEP not yet initiated Goal status: INITIAL  2.  Pt will be indep to verbalize 2-3 joint protection/cumulative trauma prevention strategies to reduce pain in R/L hands.  Baseline: Eval: Education not yet initiated Goal status: INITIAL  3.  Pt will be indep to verbalize and implement 1-2 pain management strategies to reduce pain in bilat hands.  Baseline: Eval: Education not yet initiated Goal status: INITIAL  4.  Pt will be indep to monitor skin integrity of R LF while donning/doffing finger splint in  order to reduce risk of skin breakdown. Baseline: Eval: Splint not yet issued/education not yet initiated Goal status: INITIAL  LONG TERM GOALS: Target date: 08/05/24  Pt will increase MAM-20 (for musculoskeletal conditions) score by 9 or more points in order to improve pt's perceived functional use of bilat hands with daily tasks. Baseline: Eval: MAM-20 to be given next session Goal status: INITIAL  2.  Pt will increase bilat grip strength by 10 or more lbs in each hand to ease ability to open containers. Baseline: Eval: R 35 lbs, L 28 lbs (below age range norms) Goal status: INITIAL  3.  Pt will increase lateral pinch strength by 3 lbs or more in each hand to ease ability to open food  packages.  Baseline: Eval: R 13 lbs, L 11 lbs Goal status: INITIAL  4.  Pt will increase bilat hand flexibility to enable ability to formulate a fully closed fist to reduce frequency of dropping smaller ADL supplies in either hand. Baseline: Eval: (Tip to palm) R LF lacking 3cm, L IF lacking 2.5 cm, L RF lacking 1.5 cm Goal status: INITIAL  5.  Pt will follow recommended wearing schedule of oval 8 splint with at least 75% adherence to reduce radial deviation in R LF DIP joint and reduce  risk of worsening hand deformity in R dominant hand. Baseline: Eval: Splint not yet issued; Pt presents with 35* of radial deviation on R LF DIP joint (able to passively reduce to 25*) Goal status: INITIAL  6.  Pt will tolerate manual therapy, therapeutic modalities, and exercises to decrease pain in bilat hands to a reported 3/10 pain or less with daily activities.   Baseline: Eval: 3-4/10 pain in bilat hands at rest, 7-8/10 pain with activity Goal status: INITIAL  7.  Pt will tolerate and implement edema management strategies to reduce edema in bilat hands to allow doffing/donning wedding ring. Baseline: Pt reports he's been unable to doff wedding ring for ~2 months, but previously donned/doffed ring daily; L RF PIP joint circumference 7.1 cm, R 6.9 cm Goal status: INITIAL    ASSESSMENT:  CLINICAL IMPRESSION: Issued bilat open fingered compression gloves this date for additional pain and edema management as pt reported pain in L RF reached 10/10 yesterday.  Reviewed edema management techniques and massage techniques to manage pain and swelling.  Pt verbalized understanding.  Pt continues to report good tolerance to theraputty and maintaining low reps to avoid increases in pain.  Pt will continue to benefit from skilled OT to address pain, stiffness, and weakness in bilat hands in order to improve indep and tolerance to ADL/IADL tasks.    PERFORMANCE DEFICITS: in functional skills including ADLs, IADLs,  coordination, dexterity, edema, ROM, strength, pain, flexibility, Fine motor control, body mechanics, decreased knowledge of precautions, decreased knowledge of use of DME, and UE functional use, and psychosocial skills including coping strategies, environmental adaptation, habits, and routines and behaviors.   IMPAIRMENTS: are limiting patient from ADLs, IADLs, and leisure.   COMORBIDITIES: has co-morbidities such as HTN, Afib, hx of Nstemi, HLD that affects occupational performance. Patient will benefit from skilled OT to address above impairments and improve overall function.  MODIFICATION OR ASSISTANCE TO COMPLETE EVALUATION: No modification of tasks or assist necessary to complete an evaluation.  OT OCCUPATIONAL PROFILE AND HISTORY: Detailed assessment: Review of records and additional review of physical, cognitive, psychosocial history related to current functional performance.  CLINICAL DECISION MAKING: Moderate - several treatment options, min-mod task modification necessary  REHAB  POTENTIAL: Good  EVALUATION COMPLEXITY: Moderate      PLAN:  OT FREQUENCY: 2x/week  OT DURATION: 12 weeks  PLANNED INTERVENTIONS: 97168 OT Re-evaluation, 97535 self care/ADL training, 16109 therapeutic exercise, 97530 therapeutic activity, 97140 manual therapy, 97018 paraffin, 60454 moist heat, 97010 cryotherapy, 97034 contrast bath, 97760 Orthotic Initial, 97763 Orthotic/Prosthetic subsequent, passive range of motion, coping strategies training, patient/family education, and DME and/or AE instructions  RECOMMENDED OTHER SERVICES: None at this time  CONSULTED AND AGREED WITH PLAN OF CARE: Patient  PLAN FOR NEXT SESSION: see above  Marcus Sewer, MS, OTR/L  Casandra Claw, OT 05/22/2024, 1:39 PM

## 2024-05-22 ENCOUNTER — Ambulatory Visit

## 2024-05-22 DIAGNOSIS — R278 Other lack of coordination: Secondary | ICD-10-CM

## 2024-05-22 DIAGNOSIS — M6281 Muscle weakness (generalized): Secondary | ICD-10-CM | POA: Diagnosis not present

## 2024-05-22 DIAGNOSIS — M19041 Primary osteoarthritis, right hand: Secondary | ICD-10-CM

## 2024-05-22 NOTE — Therapy (Addendum)
 OUTPATIENT OCCUPATIONAL THERAPY ORTHO TREATMENT NOTE  Patient Name: Gary Mueller MRN: [id number] DOB:[birth year], 83 y.o., male Today's Date: 05/23/2024  PCP: Dr. Eddy Goodell ([facility]) REFERRING PROVIDER: Adolphus Akin, MD (Rheumatology)  END OF SESSION:   OT End of Session - 05/23/24 1929     Visit Number 4    Number of Visits 24    Date for OT Re-Evaluation 08/05/24    OT Start Time 1400    OT Stop Time 1445    OT Time Calculation (min) 45 min    Activity Tolerance Patient tolerated treatment well    Behavior During Therapy [facility] for tasks assessed/performed             Past Medical History:  Diagnosis Date   Allergy    Anxiety    Arthritis    Cancer (HCC) 10/20/2016   Basal Cell Skin Cancer; lip, neck   Cystoid macular edema of left eye 07/07/2019   Depression    Dysrhythmia    A FIB   GERD (gastroesophageal reflux disease)    HOH (hard of hearing)    Bilateral   Hyperlipidemia    Hypertension    Patient denies   Hypothyroidism    IBS (irritable bowel syndrome)    Inguinal hernia    bilateral   Pneumonia    Thyroid  disease    Tinnitus of both ears    Past Surgical History:  Procedure Laterality Date   ANTERIOR VITRECTOMY Left 11/28/2018   Procedure: ANTERIOR VITRECTOMY;  Surgeon: Ola Berger, MD;  Location: [facility] ORS;  Service: Ophthalmology;  Laterality: Left;   CARDIAC CATHETERIZATION     CATARACT EXTRACTION W/PHACO Right 10/10/2018   Procedure: CATARACT EXTRACTION PHACO AND INTRAOCULAR LENS PLACEMENT (IOC);  Surgeon: Ola Berger, MD;  Location: [facility] OR[facility];  Service: Ophthalmology;  Laterality: Right;  US  00:41 CDE 6.87 Fluid pack lot # [id number]   CATARACT EXTRACTION W/PHACO Left 11/28/2018   Procedure: CATARACT EXTRACTION PHACO AND INTRAOCULAR LENS PLACEMENT (IOC)-LEFT;  Surgeon: Ola Berger, MD;  Location: [facility] OR[facility];  Service: Ophthalmology;  Laterality: Left;  US  00:47.7 CDE 8.92 Fluid Pack lot # [id number]   CHOLECYSTECTOMY  1968    CHONDROPLASTY Right 01/02/2018   Procedure: CHONDROPLASTY;  Surgeon: Arlyne Lame, MD;  Location: [facility] OR[facility];  Service: Orthopedics;  Laterality: Right;   COLONOSCOPY  2014   CORONARY STENT INTERVENTION N/A 10/19/2021   Procedure: CORONARY STENT INTERVENTION;  Surgeon: Percival Brace, MD;  Location: [facility];  Service: Cardiovascular;  Laterality: N/A;   EYE SURGERY     HERNIA REPAIR Left 04/18/1996   inguinal hernia repair/ Dr Marquita Situ   HERNIA REPAIR Right 02/26/1996   Dr Marquita Situ   HERNIA REPAIR Left 08/07/2002   Dr Marquita Situ   JOINT REPLACEMENT     KNEE ARTHROPLASTY Right 04/30/2020   Procedure: COMPUTER ASSISTED TOTAL KNEE ARTHROPLASTY;  Surgeon: Arlyne Lame, MD;  Location: [facility];  Service: Orthopedics;  Laterality: Right;   KNEE ARTHROSCOPY Right 01/02/2018   Procedure: ARTHROSCOPY KNEE;  Surgeon: Arlyne Lame, MD;  Location: [facility];  Service: Orthopedics;  Laterality: Right;   KNEE ARTHROSCOPY Left 02/14/2012   partial menisectomy and chondroplasty   KNEE ARTHROSCOPY WITH MEDIAL MENISECTOMY Right 01/02/2018   Procedure: KNEE ARTHROSCOPY WITH MEDIAL MENISECTOMY;  Surgeon: Arlyne Lame, MD;  Location: [facility];  Service: Orthopedics;  Laterality: Right;   LEFT HEART CATH AND CORONARY ANGIOGRAPHY N/A 10/19/2021   Procedure: LEFT HEART CATH AND CORONARY ANGIOGRAPHY;  Surgeon: Percival Brace, MD;  Location: [facility];  Service: Cardiovascular;  Laterality: N/A;   LEFT HEART CATH AND CORONARY ANGIOGRAPHY Left 10/10/2022   Procedure: LEFT HEART CATH AND CORONARY ANGIOGRAPHY;  Surgeon: Percival Brace, MD;  Location: [facility];  Service: Cardiovascular;  Laterality: Left;   TONSILLECTOMY     as a child   TOTAL KNEE ARTHROPLASTY Left 03/29/2015   [facility] Dr. Aubry Blase   VASECTOMY     Patient Active Problem List   Diagnosis Date Noted   Encounter for fitting and adjustment of hearing aid 04/04/2022   Sensorineural hearing loss, bilateral  04/04/2022   Atrial fibrillation (HCC) 04/04/2022   Hypertension 04/04/2022   Gastroesophageal reflux disease without esophagitis    Rheumatoid arthritis (HCC)    NSTEMI (non-ST elevated myocardial infarction) (HCC) 10/18/2021   Total knee replacement status 04/30/2020   Primary osteoarthritis of right knee 03/28/2020   CME (cystoid macular edema), left 07/07/2019   Nonexudative age-related macular degeneration, bilateral, intermediate dry stage 07/07/2019   SOB (shortness of breath) on exertion 08/22/2017   Chest pain with high risk for cardiac etiology 08/22/2017   Recurrent left inguinal hernia 01/04/2017   Allergic rhinitis 06/17/2015   CAD in native artery 06/17/2015   A-fib (HCC) 06/17/2015   Gonalgia 06/17/2015   Polypharmacy 06/17/2015   Cold sore 06/17/2015   Difficulty hearing 06/17/2015   HLD (hyperlipidemia) 06/17/2015   Essential hypertension 06/17/2015   Hypothyroidism 06/17/2015   Meniere's disease 06/17/2015   Buzzing in ear 06/17/2015   Beat, premature ventricular 06/17/2015   Status post total left knee replacement 04/13/2015   ONSET DATE: Beginning ~4-5 years ago, but worsening over the last 1-1.5 years  REFERRING DIAG: M19.041,M19.042 (ICD-10-CM) - Primary osteoarthritis of both hands   THERAPY DIAG:  Muscle weakness (generalized)  Other lack of coordination  Primary osteoarthritis of both hands  Rationale for Evaluation and Treatment: Rehabilitation  SUBJECTIVE:  SUBJECTIVE STATEMENT: Pt reports that he was able to use his R LF to scroll on his Kindle as a result of improving R DIP radial deviation. Pt accompanied by: self  PERTINENT HISTORY: Per medical record from rheumatologist appt on 05/05/24:  Subjective:HPI  Gary Mueller is a 83 y.o. male is here today for follow up of inflammation arthritis, osteoarthritis. The patient's allergies, current medications, past family history, past medical history, past social history, past surgical history  and problem list were reviewed and updated as appropriate.   He is having pain of the left 2nd and 4th PIP joints. He is not able to form a fist. He has poor grip. He does take Tylenol  for pains.  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  -- Continue to monitor of the methotrexate  -- Previous cortisone injection has helped the hand joints  -- Can take Tylenol  for pains as needed  -- Refer to OT for further evaluation  Return in about 6 months (around 11/05/2024) for Routine Follow Up.   PRECAUTIONS: Other: maintain joint protection strategies/cumulative trauma prevention  RED FLAGS: None   WEIGHT BEARING RESTRICTIONS: No  PAIN: 05/22/24: 4-5/10 in bilat hands  Are you having pain? Yes: NPRS scale: 3-4 in bilat hands at rest, 7-8 with activity  Pain location: bilat hands, but increased tenderness to touch in the L 4th finger and thumb, R 5th digit (burning)   Pain description: sharp in both hands, burning in the R 5th digit and L thumb CMC  Aggravating factors: activity Relieving factors: elevating, rest, tylenol , heat   FALLS: Has patient fallen in last 6 months? No  LIVING ENVIRONMENT: Lives with: lives with their family, lives with adult son   PLOF: Retired from owning a Occupational psychologist; Currently plays golf a couple days per week (must use built up golf grips and has modified his grip), exercises at the [employer] at [facility] 3-4 times a week, walks dog 2-3x per week.    PATIENT GOALS: "I'd like to be able to use them better, get more movement in my hands."   NEXT MD VISIT: Return to Dr. Lydia Sams in ~6 months, per chart (Rheumatology)  OBJECTIVE:  Note: Objective measures were completed  at Evaluation unless otherwise noted.  HAND DOMINANCE: Right  ADLs: Eating: indep; pt reports no difficulties with cutting food or manipulating eating utensils Grooming: indep Upper body dressing: difficulty managing small buttons on a dress shirt; extra time needed Lower body dressing: pt tends to buy pants without buttons d/t difficulty with fasteners  Toileting: indep Bathing: pt reports that he frequently drops his soap d/t inability to fully close either hand Tub shower transfers: pt does have grab bars in shower, but reports he is still able to grasp bars without difficulty  Equipment: jar opener for kitchen  FUNCTIONAL OUTCOME MEASURES: MAM-20 for musculoskeletal conditions: TBD next session 05/15/24: 60/80  UPPER EXTREMITY ROM:     Active ROM Right eval Left eval  Thumb MCP (0-60)    Thumb IP (0-80)    Thumb Radial abd/add (0-55)     Thumb Palmar abd/add (0-45)     Thumb Opposition to Small Finger     Index MCP (0-90) 80* flexion  78* flexion  Index PIP (0-100)     Index DIP (0-70)      Long MCP (0-90)      Long PIP (0-100) 20* flexion     Long DIP (0-70) 35* of radial deviation (passively -25* from neutral)     Ring MCP (0-90)      Ring PIP (0-100)      Ring DIP (0-70)      Little MCP (0-90)      Little PIP (0-100)      Little DIP (0-70)      (Blank rows = not tested)  Tip to palm: R LF lacking 3cm  Tip to palm: L IF lacking 2.5 cm, L RF lacking 1.5 cm     Able to oppose all digits to thumb on each hand, but thumb touches medial aspect (not tip) of 5th digit DIP on each hand   UPPER EXTREMITY MMT:     MMT Right eval Left eval  Shoulder flexion    Shoulder abduction    Shoulder adduction    Shoulder extension    Shoulder internal rotation    Shoulder external rotation    Middle trapezius    Lower trapezius    Elbow flexion    Elbow extension    Wrist flexion 5 5  Wrist extension 5 5  Wrist ulnar deviation    Wrist radial deviation    Wrist  pronation    Wrist supination    (Blank rows = not tested)  HAND FUNCTION: Grip strength: Right: 35 lbs; Left: 28 lbs,  Lateral pinch: Right: 13 lbs, Left: 11 lbs, and 3 point pinch: Right: 9 lbs, Left: 8 lbs  COORDINATION: 9 Hole Peg test: Right: 30 sec; Left: 30 sec  SENSATION: WFL  EDEMA:    L RF PIP circumference: 7.1 cm (Pt unable to doff wedding ring from L RF for 2 months now d/t increased swelling in digits) R RF PIP circumference: 6.9 cm  COGNITION: Overall cognitive status: Within functional limits for tasks assessed  OBSERVATIONS:  Pt pleasant/cooperative, and appears eager to improve functional use of hands with trial of OT.  TREATMENT DATE: 05/22/24 Moist heat used intermittently throughout session to manage pain and promote muscle relaxation for improving digit ROM in bilat hands.  Manual Therapy: Performed edema massage to L RF and reviewed self edema massage technique. -Performed edema massage and soft tissue massage to MP, PIP, DIP joints in each digit of R/L hands, working to reduce pain and swelling to improve digit ROM for functional activities.   Therapeutic Exercise: -Gentle passive stretching to digits 1-5 in each hand to promote flex/ext at the MP, PIP, and DIP joints for improving grasp/release. -Gentle passive stretching at the R LF DIP to reduce radial deviation.  Self Care: -Reviewed self application technique for Ktape to R LF and constant monitoring for adjustment of tension to avoid any increases in pain.  -Recommendation for use of microwavable hot packs at home for pain management in bilat hands; encouraged use before gentle ROM to bilat hands.                                                                                                                            PATIENT EDUCATION: Education details: Education officer, museum of Ktape to reduce R LF DIP radial deviation Person educated: Patient Education method: Explanation, Demonstration, and Verbal  cues Education comprehension: verbalized understanding, further training needed  HOME EXERCISE PROGRAM: Yellow theraband; edema massage to digits in either hand  GOALS: Goals reviewed with patient? Yes  SHORT TERM GOALS: Target date: 06/24/24  Pt will be indep to perform HEP for improving bilat hand flexibility, strength, and coordination. Baseline: Eval: HEP not yet initiated Goal status: INITIAL  2.  Pt will be indep to verbalize 2-3 joint protection/cumulative trauma prevention strategies to reduce pain in R/L hands.  Baseline: Eval: Education not yet initiated Goal status: INITIAL  3.  Pt will be indep to verbalize and implement 1-2 pain management strategies to reduce pain in bilat hands.  Baseline: Eval: Education not yet initiated Goal status: INITIAL  4.  Pt will be indep to monitor skin integrity of R LF while donning/doffing finger splint in order to reduce risk of skin breakdown. Baseline: Eval: Splint not yet issued/education not yet initiated Goal status: INITIAL  LONG TERM GOALS: Target date: 08/05/24  Pt will increase MAM-20 (for musculoskeletal conditions) score by 9 or more points in order to improve pt's perceived functional use of bilat hands with daily tasks. Baseline: Eval: MAM-20  to be given next session Goal status: INITIAL  2.  Pt will increase bilat grip strength by 10 or more lbs in each hand to ease ability to open containers. Baseline: Eval: R 35 lbs, L 28 lbs (below age range norms) Goal status: INITIAL  3.  Pt will increase lateral pinch strength by 3 lbs or more in each hand to ease ability to open food packages.  Baseline: Eval: R 13 lbs, L 11 lbs Goal status: INITIAL  4.  Pt will increase bilat hand flexibility to enable ability to formulate a fully closed fist to reduce frequency of dropping smaller ADL supplies in either hand. Baseline: Eval: (Tip to palm) R LF lacking 3cm, L IF lacking 2.5 cm, L RF lacking 1.5 cm Goal status: INITIAL  5.   Pt will follow recommended wearing schedule of oval 8 splint with at least 75% adherence to reduce radial deviation in R LF DIP joint and reduce  risk of worsening hand deformity in R dominant hand. Baseline: Eval: Splint not yet issued; Pt presents with 35* of radial deviation on R LF DIP joint (able to passively reduce to 25*) Goal status: INITIAL  6.  Pt will tolerate manual therapy, therapeutic modalities, and exercises to decrease pain in bilat hands to a reported 3/10 pain or less with daily activities.   Baseline: Eval: 3-4/10 pain in bilat hands at rest, 7-8/10 pain with activity Goal status: INITIAL  7.  Pt will tolerate and implement edema management strategies to reduce edema in bilat hands to allow doffing/donning wedding ring. Baseline: Pt reports he's been unable to doff wedding ring for ~2 months, but previously donned/doffed ring daily; L RF PIP joint circumference 7.1 cm, R 6.9 cm Goal status: INITIAL    ASSESSMENT:  CLINICAL IMPRESSION: Pt reports consistent wear of Ktape to reduce R radial deviation at the DIP joint of the R LF.  Measured this today, noting 20* improvement since using Ktape.  OT applied tape end of session; good tolerance.  Pt was pleased to report that he was able to use his R LF to scroll on his Kindle as a result of his fingertip straightening out some.  Pt with good tolerance to manual therapy and gentle PROM to each digit in bilat hands, still requiring more gentle stretch at the L RF and R SF d/t consistently increased pain in these digits.  Pt will continue to benefit from skilled OT to address pain, stiffness, and weakness in bilat hands in order to improve indep and tolerance to ADL/IADL tasks.    PERFORMANCE DEFICITS: in functional skills including ADLs, IADLs, coordination, dexterity, edema, ROM, strength, pain, flexibility, Fine motor control, body mechanics, decreased knowledge of precautions, decreased knowledge of use of DME, and UE functional  use, and psychosocial skills including coping strategies, environmental adaptation, habits, and routines and behaviors.   IMPAIRMENTS: are limiting patient from ADLs, IADLs, and leisure.   COMORBIDITIES: has co-morbidities such as HTN, Afib, hx of Nstemi, HLD that affects occupational performance. Patient will benefit from skilled OT to address above impairments and improve overall function.  MODIFICATION OR ASSISTANCE TO COMPLETE EVALUATION: No modification of tasks or assist necessary to complete an evaluation.  OT OCCUPATIONAL PROFILE AND HISTORY: Detailed assessment: Review of records and additional review of physical, cognitive, psychosocial history related to current functional performance.  CLINICAL DECISION MAKING: Moderate - several treatment options, min-mod task modification necessary  REHAB POTENTIAL: Good  EVALUATION COMPLEXITY: Moderate    PLAN:  OT FREQUENCY:  2x/week  OT DURATION: 12 weeks  PLANNED INTERVENTIONS: 97168 OT Re-evaluation, 97535 self care/ADL training, 16109 therapeutic exercise, 97530 therapeutic activity, 97140 manual therapy, 97018 paraffin, 60454 moist heat, 97010 cryotherapy, 97034 contrast bath, 97760 Orthotic Initial, 97763 Orthotic/Prosthetic subsequent, passive range of motion, coping strategies training, patient/family education, and DME and/or AE instructions  RECOMMENDED OTHER SERVICES: None at this time  CONSULTED AND AGREED WITH PLAN OF CARE: Patient  PLAN FOR NEXT SESSION: see above  Marcus Sewer, MS, OTR/L  Casandra Claw, OT 05/23/2024, 7:33 PM

## 2024-05-27 ENCOUNTER — Ambulatory Visit: Attending: Rheumatology

## 2024-05-27 DIAGNOSIS — M19042 Primary osteoarthritis, left hand: Secondary | ICD-10-CM | POA: Insufficient documentation

## 2024-05-27 DIAGNOSIS — R278 Other lack of coordination: Secondary | ICD-10-CM | POA: Insufficient documentation

## 2024-05-27 DIAGNOSIS — M6281 Muscle weakness (generalized): Secondary | ICD-10-CM | POA: Insufficient documentation

## 2024-05-27 DIAGNOSIS — M19041 Primary osteoarthritis, right hand: Secondary | ICD-10-CM | POA: Insufficient documentation

## 2024-05-29 ENCOUNTER — Ambulatory Visit

## 2024-05-29 DIAGNOSIS — M19041 Primary osteoarthritis, right hand: Secondary | ICD-10-CM

## 2024-05-29 DIAGNOSIS — M6281 Muscle weakness (generalized): Secondary | ICD-10-CM | POA: Diagnosis not present

## 2024-05-29 DIAGNOSIS — R278 Other lack of coordination: Secondary | ICD-10-CM

## 2024-05-30 NOTE — Therapy (Signed)
 OUTPATIENT OCCUPATIONAL THERAPY ORTHO TREATMENT NOTE  Patient Name: Gary Mueller MRN: [id number] DOB:[birth year], 83 y.o., male Today's Date: 05/30/2024  PCP: Dr. Eddy Goodell ([facility]) REFERRING PROVIDER: Adolphus Akin, MD (Rheumatology)  END OF SESSION:   OT End of Session - 05/30/24 1846     Visit Number 5    Number of Visits 24    Date for OT Re-Evaluation 08/05/24    OT Start Time 1530    OT Stop Time 1615    OT Time Calculation (min) 45 min    Activity Tolerance Patient tolerated treatment well    Behavior During Therapy [facility] for tasks assessed/performed             Past Medical History:  Diagnosis Date   Allergy    Anxiety    Arthritis    Cancer (HCC) 10/20/2016   Basal Cell Skin Cancer; lip, neck   Cystoid macular edema of left eye 07/07/2019   Depression    Dysrhythmia    A FIB   GERD (gastroesophageal reflux disease)    HOH (hard of hearing)    Bilateral   Hyperlipidemia    Hypertension    Patient denies   Hypothyroidism    IBS (irritable bowel syndrome)    Inguinal hernia    bilateral   Pneumonia    Thyroid  disease    Tinnitus of both ears    Past Surgical History:  Procedure Laterality Date   ANTERIOR VITRECTOMY Left 11/28/2018   Procedure: ANTERIOR VITRECTOMY;  Surgeon: Ola Berger, MD;  Location: [facility] ORS;  Service: Ophthalmology;  Laterality: Left;   CARDIAC CATHETERIZATION     CATARACT EXTRACTION W/PHACO Right 10/10/2018   Procedure: CATARACT EXTRACTION PHACO AND INTRAOCULAR LENS PLACEMENT (IOC);  Surgeon: Ola Berger, MD;  Location: [facility] ORS;  Service: Ophthalmology;  Laterality: Right;  US  00:41 CDE 6.87 Fluid pack lot # [id number]   CATARACT EXTRACTION W/PHACO Left 11/28/2018   Procedure: CATARACT EXTRACTION PHACO AND INTRAOCULAR LENS PLACEMENT (IOC)-LEFT;  Surgeon: Ola Berger, MD;  Location: [facility] OR[facility];  Service: Ophthalmology;  Laterality: Left;  US  00:47.7 CDE 8.92 Fluid Pack lot # [id number]   CHOLECYSTECTOMY  1968    CHONDROPLASTY Right 01/02/2018   Procedure: CHONDROPLASTY;  Surgeon: Arlyne Lame, MD;  Location: [facility] OR[facility];  Service: Orthopedics;  Laterality: Right;   COLONOSCOPY  2014   CORONARY STENT INTERVENTION N/A 10/19/2021   Procedure: CORONARY STENT INTERVENTION;  Surgeon: Percival Brace, MD;  Location: [facility];  Service: Cardiovascular;  Laterality: N/A;   EYE SURGERY     HERNIA REPAIR Left 04/18/1996   inguinal hernia repair/ Dr Marquita Situ   HERNIA REPAIR Right 02/26/1996   Dr Marquita Situ   HERNIA REPAIR Left 08/07/2002   Dr Marquita Situ   JOINT REPLACEMENT     KNEE ARTHROPLASTY Right 04/30/2020   Procedure: COMPUTER ASSISTED TOTAL KNEE ARTHROPLASTY;  Surgeon: Arlyne Lame, MD;  Location: [facility];  Service: Orthopedics;  Laterality: Right;   KNEE ARTHROSCOPY Right 01/02/2018   Procedure: ARTHROSCOPY KNEE;  Surgeon: Arlyne Lame, MD;  Location: [facility] OR[facility];  Service: Orthopedics;  Laterality: Right;   KNEE ARTHROSCOPY Left 02/14/2012   partial menisectomy and chondroplasty   KNEE ARTHROSCOPY WITH MEDIAL MENISECTOMY Right 01/02/2018   Procedure: KNEE ARTHROSCOPY WITH MEDIAL MENISECTOMY;  Surgeon: Arlyne Lame, MD;  Location: [facility];  Service: Orthopedics;  Laterality: Right;   LEFT HEART CATH AND CORONARY ANGIOGRAPHY N/A 10/19/2021   Procedure: LEFT HEART CATH AND CORONARY ANGIOGRAPHY;  Surgeon: Percival Brace, MD;  Location: [facility];  Service: Cardiovascular;  Laterality: N/A;   LEFT HEART CATH AND CORONARY ANGIOGRAPHY Left 10/10/2022   Procedure: LEFT HEART CATH AND CORONARY ANGIOGRAPHY;  Surgeon: Percival Brace, MD;  Location: [facility];  Service: Cardiovascular;  Laterality: Left;   TONSILLECTOMY     as a child   TOTAL KNEE ARTHROPLASTY Left 03/29/2015   [facility] Dr. Aubry Blase   VASECTOMY     Patient Active Problem List   Diagnosis Date Noted   Encounter for fitting and adjustment of hearing aid 04/04/2022   Sensorineural hearing loss, bilateral  04/04/2022   Atrial fibrillation (HCC) 04/04/2022   Hypertension 04/04/2022   Gastroesophageal reflux disease without esophagitis    Rheumatoid arthritis (HCC)    NSTEMI (non-ST elevated myocardial infarction) (HCC) 10/18/2021   Total knee replacement status 04/30/2020   Primary osteoarthritis of right knee 03/28/2020   CME (cystoid macular edema), left 07/07/2019   Nonexudative age-related macular degeneration, bilateral, intermediate dry stage 07/07/2019   SOB (shortness of breath) on exertion 08/22/2017   Chest pain with high risk for cardiac etiology 08/22/2017   Recurrent left inguinal hernia 01/04/2017   Allergic rhinitis 06/17/2015   CAD in native artery 06/17/2015   A-fib (HCC) 06/17/2015   Gonalgia 06/17/2015   Polypharmacy 06/17/2015   Cold sore 06/17/2015   Difficulty hearing 06/17/2015   HLD (hyperlipidemia) 06/17/2015   Essential hypertension 06/17/2015   Hypothyroidism 06/17/2015   Meniere's disease 06/17/2015   Buzzing in ear 06/17/2015   Beat, premature ventricular 06/17/2015   Status post total left knee replacement 04/13/2015   ONSET DATE: Beginning ~4-5 years ago, but worsening over the last 1-1.5 years  REFERRING DIAG: M19.041,M19.042 (ICD-10-CM) - Primary osteoarthritis of both hands   THERAPY DIAG:  Muscle weakness (generalized)  Other lack of coordination  Primary osteoarthritis of both hands  Rationale for Evaluation and Treatment: Rehabilitation  SUBJECTIVE:  SUBJECTIVE STATEMENT: Pt reports he feels like he is making some improvements thus far with his hands. Pt accompanied by: self  PERTINENT HISTORY: Per medical record from rheumatologist appt on 05/05/24:  Subjective:HPI  Gary Mueller is a 83 y.o. male is here today for follow up of inflammation arthritis, osteoarthritis. The patient's allergies, current medications, past family history, past medical history, past social history, past surgical history and problem list were reviewed and  updated as appropriate.   He is having pain of the left 2nd and 4th PIP joints. He is not able to form a fist. He has poor grip. He does take Tylenol  for pains.  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  -- Continue to monitor of the methotrexate  -- Previous cortisone injection has helped the hand joints  -- Can take Tylenol  for pains as needed  -- Refer to OT for further evaluation  Return in about 6 months (around 11/05/2024) for Routine Follow Up.   PRECAUTIONS: Other: maintain joint protection strategies/cumulative trauma prevention  RED FLAGS: None   WEIGHT BEARING RESTRICTIONS: No  PAIN: 05/27/24: 4-5/10 in bilat hands  Are you having pain? Yes: NPRS scale: 3-4 in bilat hands at rest, 7-8 with activity  Pain location: bilat hands, but increased tenderness to touch  in the L 4th finger and thumb, R 5th digit (burning)   Pain description: sharp in both hands, burning in the R 5th digit and L thumb CMC  Aggravating factors: activity Relieving factors: elevating, rest, tylenol , heat   FALLS: Has patient fallen in last 6 months? No  LIVING ENVIRONMENT: Lives with: lives with their family, lives with adult son   PLOF: Retired from owning a Occupational psychologist; Currently plays golf a couple days per week (must use built up golf grips and has modified his grip), exercises at the [employer] at [facility] 3-4 times a week, walks dog 2-3x per week.    PATIENT GOALS: "I'd like to be able to use them better, get more movement in my hands."   NEXT MD VISIT: Return to Dr. Lydia Sams in ~6 months, per chart (Rheumatology)  OBJECTIVE:  Note: Objective measures were completed at Evaluation unless otherwise  noted.  HAND DOMINANCE: Right  ADLs: Eating: indep; pt reports no difficulties with cutting food or manipulating eating utensils Grooming: indep Upper body dressing: difficulty managing small buttons on a dress shirt; extra time needed Lower body dressing: pt tends to buy pants without buttons d/t difficulty with fasteners  Toileting: indep Bathing: pt reports that he frequently drops his soap d/t inability to fully close either hand Tub shower transfers: pt does have grab bars in shower, but reports he is still able to grasp bars without difficulty  Equipment: jar opener for kitchen  FUNCTIONAL OUTCOME MEASURES: MAM-20 for musculoskeletal conditions: TBD next session 05/15/24: 60/80  UPPER EXTREMITY ROM:     Active ROM Right eval Left eval  Thumb MCP (0-60)    Thumb IP (0-80)    Thumb Radial abd/add (0-55)     Thumb Palmar abd/add (0-45)     Thumb Opposition to Small Finger     Index MCP (0-90) 80* flexion  78* flexion  Index PIP (0-100)     Index DIP (0-70)      Long MCP (0-90)      Long PIP (0-100) 20* flexion     Long DIP (0-70) 35* of radial deviation (passively -25* from neutral)     Ring MCP (0-90)      Ring PIP (0-100)      Ring DIP (0-70)      Little MCP (0-90)      Little PIP (0-100)      Little DIP (0-70)      (Blank rows = not tested)  Tip to palm: R LF lacking 3cm  Tip to palm: L IF lacking 2.5 cm, L RF lacking 1.5 cm     Able to oppose all digits to thumb on each hand, but thumb touches medial aspect (not tip) of 5th digit DIP on each hand   UPPER EXTREMITY MMT:     MMT Right eval Left eval  Shoulder flexion    Shoulder abduction    Shoulder adduction    Shoulder extension    Shoulder internal rotation    Shoulder external rotation    Middle trapezius    Lower trapezius    Elbow flexion    Elbow extension    Wrist flexion 5 5  Wrist extension 5 5  Wrist ulnar deviation    Wrist radial deviation    Wrist pronation    Wrist supination     (Blank rows = not tested)  HAND FUNCTION: Grip strength: Right: 35 lbs; Left: 28 lbs, Lateral pinch: Right: 13 lbs, Left: 11 lbs, and  3 point pinch: Right: 9 lbs, Left: 8 lbs  COORDINATION: 9 Hole Peg test: Right: 30 sec; Left: 30 sec  SENSATION: WFL  EDEMA:    L RF PIP circumference: 7.1 cm (Pt unable to doff wedding ring from L RF for 2 months now d/t increased swelling in digits) R RF PIP circumference: 6.9 cm  COGNITION: Overall cognitive status: Within functional limits for tasks assessed  OBSERVATIONS:  Pt pleasant/cooperative, and appears eager to improve functional use of hands with trial of OT.  TREATMENT DATE: 05/27/24 Moist heat used intermittently throughout session to manage pain and promote muscle relaxation for improving digit ROM in bilat hands.  Manual Therapy: -Performed edema massage and soft tissue massage to MP, PIP, DIP joints in each digit of R/L hands, working to reduce pain and swelling to improve digit ROM for functional activities.   Therapeutic Exercise: -Gentle passive stretching to digits 1-5 in each hand to promote flex/ext at the MP, PIP, and DIP joints for improving grasp/release. -Gentle passive stretching at the R LF DIP to reduce radial deviation. -B grip strengthening: light/med resistance with 2 red bands on hand gripper: 3 sets 10 reps each hand with constant monitoring to ensure tolerance to resistance and reps -R/L hand pinch strengthening: use of therapy resistant clothespins (all colors) to target 3 point pinch, moving pins on/off a vertical dowel for 1 trial each hand.                                                                                                                            PATIENT EDUCATION: Education details: edema reduction strategies (recommend to increase ring size) Person educated: Patient Education method: Verbal cues Education comprehension: verbalized understanding  HOME EXERCISE PROGRAM: Yellow theraband;  edema massage to digits in either hand  GOALS: Goals reviewed with patient? Yes  SHORT TERM GOALS: Target date: 06/24/24  Pt will be indep to perform HEP for improving bilat hand flexibility, strength, and coordination. Baseline: Eval: HEP not yet initiated Goal status: INITIAL  2.  Pt will be indep to verbalize 2-3 joint protection/cumulative trauma prevention strategies to reduce pain in R/L hands.  Baseline: Eval: Education not yet initiated Goal status: INITIAL  3.  Pt will be indep to verbalize and implement 1-2 pain management strategies to reduce pain in bilat hands.  Baseline: Eval: Education not yet initiated Goal status: INITIAL  4.  Pt will be indep to monitor skin integrity of R LF while donning/doffing finger splint in order to reduce risk of skin breakdown. Baseline: Eval: Splint not yet issued/education not yet initiated Goal status: INITIAL  LONG TERM GOALS: Target date: 08/05/24  Pt will increase MAM-20 (for musculoskeletal conditions) score by 9 or more points in order to improve pt's perceived functional use of bilat hands with daily tasks. Baseline: Eval: MAM-20 to be given next session Goal status: INITIAL  2.  Pt will increase bilat grip strength by 10 or more lbs in each  hand to ease ability to open containers. Baseline: Eval: R 35 lbs, L 28 lbs (below age range norms) Goal status: INITIAL  3.  Pt will increase lateral pinch strength by 3 lbs or more in each hand to ease ability to open food packages.  Baseline: Eval: R 13 lbs, L 11 lbs Goal status: INITIAL  4.  Pt will increase bilat hand flexibility to enable ability to formulate a fully closed fist to reduce frequency of dropping smaller ADL supplies in either hand. Baseline: Eval: (Tip to palm) R LF lacking 3cm, L IF lacking 2.5 cm, L RF lacking 1.5 cm Goal status: INITIAL  5.  Pt will follow recommended wearing schedule of oval 8 splint with at least 75% adherence to reduce radial deviation in R LF  DIP joint and reduce  risk of worsening hand deformity in R dominant hand. Baseline: Eval: Splint not yet issued; Pt presents with 35* of radial deviation on R LF DIP joint (able to passively reduce to 25*) Goal status: INITIAL  6.  Pt will tolerate manual therapy, therapeutic modalities, and exercises to decrease pain in bilat hands to a reported 3/10 pain or less with daily activities.   Baseline: Eval: 3-4/10 pain in bilat hands at rest, 7-8/10 pain with activity Goal status: INITIAL  7.  Pt will tolerate and implement edema management strategies to reduce edema in bilat hands to allow doffing/donning wedding ring. Baseline: Pt reports he's been unable to doff wedding ring for ~2 months, but previously donned/doffed ring daily; L RF PIP joint circumference 7.1 cm, R 6.9 cm Goal status: INITIAL    ASSESSMENT:  CLINICAL IMPRESSION: Pt continues to report improvements in bilat hands since starting therapy, including consistent ability to don/doff wedding ring each night.  Recommendation to trial 1 week with ring doffed to assess improvements in pain and edema to L ring finger.  Anticipate need to increase ring size to minimize pain/swelling during the day if pt wishes to continue to wear his ring.  Pt receptive to this.  Pt with good tolerance to manual therapy and gentle PROM to each digit in bilat hands, still requiring more gentle stretch at the L RF and R SF d/t consistently increased pain in these digits.  Initiated light resistance strengthening for bilat grip and pinch strengthening, maintaining low reps and providing constant monitoring for activity tolerance to ensure no increases in pain.  Pt tolerated well.  Ktape applied to R LF end of session to reduce DIP radial deviation.  Pt continues to tolerate this well and is able to change and reapply his tape at home with minimal difficulty, though he feels OT is able to obtain better positioning of fingertip than he is capable of with 1 hand.   Pt will continue to benefit from skilled OT to address pain, stiffness, and weakness in bilat hands in order to improve indep and tolerance to ADL/IADL tasks.    PERFORMANCE DEFICITS: in functional skills including ADLs, IADLs, coordination, dexterity, edema, ROM, strength, pain, flexibility, Fine motor control, body mechanics, decreased knowledge of precautions, decreased knowledge of use of DME, and UE functional use, and psychosocial skills including coping strategies, environmental adaptation, habits, and routines and behaviors.   IMPAIRMENTS: are limiting patient from ADLs, IADLs, and leisure.   COMORBIDITIES: has co-morbidities such as HTN, Afib, hx of Nstemi, HLD that affects occupational performance. Patient will benefit from skilled OT to address above impairments and improve overall function.  MODIFICATION OR ASSISTANCE TO COMPLETE EVALUATION: No  modification of tasks or assist necessary to complete an evaluation.  OT OCCUPATIONAL PROFILE AND HISTORY: Detailed assessment: Review of records and additional review of physical, cognitive, psychosocial history related to current functional performance.  CLINICAL DECISION MAKING: Moderate - several treatment options, min-mod task modification necessary  REHAB POTENTIAL: Good  EVALUATION COMPLEXITY: Moderate    PLAN:  OT FREQUENCY: 2x/week  OT DURATION: 12 weeks  PLANNED INTERVENTIONS: 97168 OT Re-evaluation, 97535 self care/ADL training, 16109 therapeutic exercise, 97530 therapeutic activity, 97140 manual therapy, 97018 paraffin, 60454 moist heat, 97010 cryotherapy, 97034 contrast bath, 97760 Orthotic Initial, 97763 Orthotic/Prosthetic subsequent, passive range of motion, coping strategies training, patient/family education, and DME and/or AE instructions  RECOMMENDED OTHER SERVICES: None at this time  CONSULTED AND AGREED WITH PLAN OF CARE: Patient  PLAN FOR NEXT SESSION: see above  Marcus Sewer, MS, OTR/L  Casandra Claw, OT 05/30/2024, 6:48 PM

## 2024-05-31 NOTE — Therapy (Signed)
 OUTPATIENT OCCUPATIONAL THERAPY ORTHO TREATMENT NOTE  Patient Name: Gary Mueller MRN: [id number] DOB:October 28, 1941, 83 y.o., male Today's Date: 05/31/2024  PCP: Dr. Eddy Goodell ([facility]) REFERRING PROVIDER: Adolphus Akin, MD (Rheumatology)  END OF SESSION:   OT End of Session - 05/31/24 1017     Visit Number 6    Number of Visits 24    Date for OT Re-Evaluation 08/05/24    OT Start Time 1445    OT Stop Time 1530    OT Time Calculation (min) 45 min    Activity Tolerance Patient tolerated treatment well    Behavior During Therapy [facility] for tasks assessed/performed             Past Medical History:  Diagnosis Date   Allergy    Anxiety    Arthritis    Cancer (HCC) 10/20/2016   Basal Cell Skin Cancer; lip, neck   Cystoid macular edema of left eye 07/07/2019   Depression    Dysrhythmia    A FIB   GERD (gastroesophageal reflux disease)    HOH (hard of hearing)    Bilateral   Hyperlipidemia    Hypertension    Patient denies   Hypothyroidism    IBS (irritable bowel syndrome)    Inguinal hernia    bilateral   Pneumonia    Thyroid  disease    Tinnitus of both ears    Past Surgical History:  Procedure Laterality Date   ANTERIOR VITRECTOMY Left 11/28/2018   Procedure: ANTERIOR VITRECTOMY;  Surgeon: Ola Berger, MD;  Location: [facility] ORS;  Service: Ophthalmology;  Laterality: Left;   CARDIAC CATHETERIZATION     CATARACT EXTRACTION W/PHACO Right 10/10/2018   Procedure: CATARACT EXTRACTION PHACO AND INTRAOCULAR LENS PLACEMENT (IOC);  Surgeon: Ola Berger, MD;  Location: [facility] ORS;  Service: Ophthalmology;  Laterality: Right;  US  00:41 CDE 6.87 Fluid pack lot # [id number]   CATARACT EXTRACTION W/PHACO Left 11/28/2018   Procedure: CATARACT EXTRACTION PHACO AND INTRAOCULAR LENS PLACEMENT (IOC)-LEFT;  Surgeon: Ola Berger, MD;  Location: [facility] ORS;  Service: Ophthalmology;  Laterality: Left;  US  00:47.7 CDE 8.92 Fluid Pack lot # [id number]   CHOLECYSTECTOMY  1968    CHONDROPLASTY Right 01/02/2018   Procedure: CHONDROPLASTY;  Surgeon: Arlyne Lame, MD;  Location: [facility] ORS;  Service: Orthopedics;  Laterality: Right;   COLONOSCOPY  2014   CORONARY STENT INTERVENTION N/A 10/19/2021   Procedure: CORONARY STENT INTERVENTION;  Surgeon: Percival Brace, MD;  Location: [facility] INVASIVE CV LAB;  Service: Cardiovascular;  Laterality: N/A;   EYE SURGERY     HERNIA REPAIR Left 04/18/1996   inguinal hernia repair/ Dr Marquita Situ   HERNIA REPAIR Right 02/26/1996   Dr Marquita Situ   HERNIA REPAIR Left 08/07/2002   Dr Marquita Situ   JOINT REPLACEMENT     KNEE ARTHROPLASTY Right 04/30/2020   Procedure: COMPUTER ASSISTED TOTAL KNEE ARTHROPLASTY;  Surgeon: Arlyne Lame, MD;  Location: [facility] ORS;  Service: Orthopedics;  Laterality: Right;   KNEE ARTHROSCOPY Right 01/02/2018   Procedure: ARTHROSCOPY KNEE;  Surgeon: Arlyne Lame, MD;  Location: [facility] ORS;  Service: Orthopedics;  Laterality: Right;   KNEE ARTHROSCOPY Left 02/14/2012   partial menisectomy and chondroplasty   KNEE ARTHROSCOPY WITH MEDIAL MENISECTOMY Right 01/02/2018   Procedure: KNEE ARTHROSCOPY WITH MEDIAL MENISECTOMY;  Surgeon: Arlyne Lame, MD;  Location: [facility] ORS;  Service: Orthopedics;  Laterality: Right;   LEFT HEART CATH AND CORONARY ANGIOGRAPHY N/A 10/19/2021   Procedure: LEFT HEART CATH AND CORONARY ANGIOGRAPHY;  Surgeon: Percival Brace, MD;  Location: [facility] INV[facility] CV [facility];  Service: Cardiovascular;  Laterality: N/A;   LEFT HEART CATH AND CORONARY ANGIOGRAPHY Left 10/10/2022   Procedure: LEFT HEART CATH AND CORONARY ANGIOGRAPHY;  Surgeon: Percival Brace, MD;  Location: [facility] INV[facility] CV [facility];  Service: Cardiovascular;  Laterality: Left;   TONSILLECTOMY     as a child   TOTAL KNEE ARTHROPLASTY Left 03/29/2015   [facility] Dr. Aubry Blase   VASECTOMY     Patient Active Problem List   Diagnosis Date Noted   Encounter for fitting and adjustment of hearing aid 04/04/2022   Sensorineural hearing loss, bilateral  04/04/2022   Atrial fibrillation (HCC) 04/04/2022   Hypertension 04/04/2022   Gastroesophageal reflux disease without esophagitis    Rheumatoid arthritis (HCC)    NSTEMI (non-ST elevated myocardial infarction) (HCC) 10/18/2021   Total knee replacement status 04/30/2020   Primary osteoarthritis of right knee 03/28/2020   CME (cystoid macular edema), left 07/07/2019   Nonexudative age-related macular degeneration, bilateral, intermediate dry stage 07/07/2019   SOB (shortness of breath) on exertion 08/22/2017   Chest pain with high risk for cardiac etiology 08/22/2017   Recurrent left inguinal hernia 01/04/2017   Allergic rhinitis 06/17/2015   CAD in native artery 06/17/2015   A-fib (HCC) 06/17/2015   Gonalgia 06/17/2015   Polypharmacy 06/17/2015   Cold sore 06/17/2015   Difficulty hearing 06/17/2015   HLD (hyperlipidemia) 06/17/2015   Essential hypertension 06/17/2015   Hypothyroidism 06/17/2015   Meniere's disease 06/17/2015   Buzzing in ear 06/17/2015   Beat, premature ventricular 06/17/2015   Status post total left knee replacement 04/13/2015   ONSET DATE: Beginning ~4-5 years ago, but worsening over the last 1-1.5 years  REFERRING DIAG: M19.041,M19.042 (ICD-10-CM) - Primary osteoarthritis of both hands   THERAPY DIAG:  Muscle weakness (generalized)  Other lack of coordination  Primary osteoarthritis of both hands  Rationale for Evaluation and Treatment: Rehabilitation  SUBJECTIVE:  SUBJECTIVE STATEMENT: Pt reports he has been able to engage his R hand better for grasping items, noting he was able to pick up the soap while showering this week. Pt accompanied by: self  PERTINENT HISTORY: Per medical record from rheumatologist appt on 05/05/24:  Subjective:HPI  Gary Mueller is a 83 y.o. male is here today for follow up of inflammation arthritis, osteoarthritis. The patient's allergies, current medications, past family history, past medical history, past social  history, past surgical history and problem list were reviewed and updated as appropriate.   He is having pain of the left 2nd and 4th PIP joints. He is not able to form a fist. He has poor grip. He does take Tylenol  for pains.  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  -- Continue to monitor of the methotrexate  -- Previous cortisone injection has helped the hand joints  -- Can take Tylenol  for pains as needed  -- Refer to OT for further evaluation  Return in about 6 months (around 11/05/2024) for Routine Follow Up.   PRECAUTIONS: Other: maintain joint protection strategies/cumulative trauma prevention  RED FLAGS: None   WEIGHT BEARING RESTRICTIONS: No  PAIN: 05/29/24: 4-5/10 in bilat hands  Are you having pain? Yes: NPRS scale: 3-4 in bilat hands at rest,  7-8 with activity  Pain location: bilat hands, but increased tenderness to touch in the L 4th finger and thumb, R 5th digit (burning)   Pain description: sharp in both hands, burning in the R 5th digit and L thumb CMC  Aggravating factors: activity Relieving factors: elevating, rest, tylenol , heat   FALLS: Has patient fallen in last 6 months? No  LIVING ENVIRONMENT: Lives with: lives with their family, lives with adult son   PLOF: Retired from owning a Occupational psychologist; Currently plays golf a couple days per week (must use built up golf grips and has modified his grip), exercises at the [employer] at [facility] 3-4 times a week, walks dog 2-3x per week.    PATIENT GOALS: "I'd like to be able to use them better, get more movement in my hands."   NEXT MD VISIT: Return to Dr. Lydia Sams in ~6 months, per chart (Rheumatology)  OBJECTIVE:  Note:  Objective measures were completed at Evaluation unless otherwise noted.  HAND DOMINANCE: Right  ADLs: Eating: indep; pt reports no difficulties with cutting food or manipulating eating utensils Grooming: indep Upper body dressing: difficulty managing small buttons on a dress shirt; extra time needed Lower body dressing: pt tends to buy pants without buttons d/t difficulty with fasteners  Toileting: indep Bathing: pt reports that he frequently drops his soap d/t inability to fully close either hand Tub shower transfers: pt does have grab bars in shower, but reports he is still able to grasp bars without difficulty  Equipment: jar opener for kitchen  FUNCTIONAL OUTCOME MEASURES: MAM-20 for musculoskeletal conditions: TBD next session 05/15/24: 60/80  UPPER EXTREMITY ROM:     Active ROM Right eval Left eval  Thumb MCP (0-60)    Thumb IP (0-80)    Thumb Radial abd/add (0-55)     Thumb Palmar abd/add (0-45)     Thumb Opposition to Small Finger     Index MCP (0-90) 80* flexion  78* flexion  Index PIP (0-100)     Index DIP (0-70)      Long MCP (0-90)      Long PIP (0-100) 20* flexion     Long DIP (0-70) 35* of radial deviation (passively -25* from neutral)     Ring MCP (0-90)      Ring PIP (0-100)      Ring DIP (0-70)      Little MCP (0-90)      Little PIP (0-100)      Little DIP (0-70)      (Blank rows = not tested)  Tip to palm: R LF lacking 3cm  Tip to palm: L IF lacking 2.5 cm, L RF lacking 1.5 cm     Able to oppose all digits to thumb on each hand, but thumb touches medial aspect (not tip) of 5th digit DIP on each hand   UPPER EXTREMITY MMT:     MMT Right eval Left eval  Shoulder flexion    Shoulder abduction    Shoulder adduction    Shoulder extension    Shoulder internal rotation    Shoulder external rotation    Middle trapezius    Lower trapezius    Elbow flexion    Elbow extension    Wrist flexion 5 5  Wrist extension 5 5  Wrist ulnar deviation     Wrist radial deviation    Wrist pronation    Wrist supination    (Blank rows = not tested)  HAND FUNCTION: Grip strength: Right: 35  lbs; Left: 28 lbs, Lateral pinch: Right: 13 lbs, Left: 11 lbs, and 3 point pinch: Right: 9 lbs, Left: 8 lbs  COORDINATION: 9 Hole Peg test: Right: 30 sec; Left: 30 sec  SENSATION: WFL  EDEMA:    L RF PIP circumference: 7.1 cm (Pt unable to doff wedding ring from L RF for 2 months now d/t increased swelling in digits) R RF PIP circumference: 6.9 cm  COGNITION: Overall cognitive status: Within functional limits for tasks assessed  OBSERVATIONS:  Pt pleasant/cooperative, and appears eager to improve functional use of hands with trial of OT.  TREATMENT DATE: 05/29/24 Moist heat used intermittently throughout session to manage pain and promote muscle relaxation for improving digit ROM in bilat hands.  Manual Therapy: -Performed edema massage and soft tissue massage to MP, PIP, DIP joints in each digit of R/L hands, working to reduce pain and swelling to improve digit ROM for functional activities.   Therapeutic Exercise: -Gentle passive stretching to digits 1-5 in each hand to promote flex/ext at the MP, PIP, and DIP joints for improving grasp/release. -Gentle passive stretching at the R LF DIP to reduce radial deviation. -B grip strengthening: Hand gripper set at 11.2 x for R/L hands for 1 trial to remove jumbo pegs from pegboard; increased to 17.9 lbs for a 2nd trial on R hand only (L hand 2nd trial remained at 11.2 lbs).  Constant monitoring for tolerance of resistance and maintained low reps to avoid increased pain in hands. -R/L hand pinch strengthening: use of therapy resistant clothespins with green/blue/black pins only to target lateral and 3 point pinch, moving pins on/off a vertical dowel for 1 trial each hand for each pinch type.                                                                                                                             PATIENT EDUCATION: Education details: Continue to keep ring off L hand RF this week to monitor for improvements in pain and edema Person educated: Patient Education method: Verbal cues Education comprehension: verbalized understanding  HOME EXERCISE PROGRAM: Yellow theraband; edema massage to digits in either hand  GOALS: Goals reviewed with patient? Yes  SHORT TERM GOALS: Target date: 06/24/24  Pt will be indep to perform HEP for improving bilat hand flexibility, strength, and coordination. Baseline: Eval: HEP not yet initiated Goal status: INITIAL  2.  Pt will be indep to verbalize 2-3 joint protection/cumulative trauma prevention strategies to reduce pain in R/L hands.  Baseline: Eval: Education not yet initiated Goal status: INITIAL  3.  Pt will be indep to verbalize and implement 1-2 pain management strategies to reduce pain in bilat hands.  Baseline: Eval: Education not yet initiated Goal status: INITIAL  4.  Pt will be indep to monitor skin integrity of R LF while donning/doffing finger splint in order to reduce risk of skin breakdown. Baseline: Eval: Splint not yet issued/education not yet initiated Goal status: INITIAL  LONG TERM GOALS: Target date: 08/05/24  Pt will increase MAM-20 (for musculoskeletal conditions) score by 9 or more points in order to improve pt's perceived functional use of bilat hands with daily tasks. Baseline: Eval: MAM-20 to be given next session Goal status: INITIAL  2.  Pt will increase bilat grip strength by 10 or more lbs in each hand to ease ability to open containers. Baseline: Eval: R 35 lbs, L 28 lbs (below age range norms) Goal status: INITIAL  3.  Pt will increase lateral pinch strength by 3 lbs or more in each hand to ease ability to open food packages.  Baseline: Eval: R 13 lbs, L 11 lbs Goal status: INITIAL  4.  Pt will increase bilat hand flexibility to enable ability to formulate a fully closed fist to reduce frequency of  dropping smaller ADL supplies in either hand. Baseline: Eval: (Tip to palm) R LF lacking 3cm, L IF lacking 2.5 cm, L RF lacking 1.5 cm Goal status: INITIAL  5.  Pt will follow recommended wearing schedule of oval 8 splint with at least 75% adherence to reduce radial deviation in R LF DIP joint and reduce  risk of worsening hand deformity in R dominant hand. Baseline: Eval: Splint not yet issued; Pt presents with 35* of radial deviation on R LF DIP joint (able to passively reduce to 25*) Goal status: INITIAL  6.  Pt will tolerate manual therapy, therapeutic modalities, and exercises to decrease pain in bilat hands to a reported 3/10 pain or less with daily activities.   Baseline: Eval: 3-4/10 pain in bilat hands at rest, 7-8/10 pain with activity Goal status: INITIAL  7.  Pt will tolerate and implement edema management strategies to reduce edema in bilat hands to allow doffing/donning wedding ring. Baseline: Pt reports he's been unable to doff wedding ring for ~2 months, but previously donned/doffed ring daily; L RF PIP joint circumference 7.1 cm, R 6.9 cm Goal status: INITIAL    ASSESSMENT:  CLINICAL IMPRESSION: Pt reports he has been able to engage his R hand better for grasping items, noting he was able to pick up the soap while showering this week.  Pt with good tolerance to manual therapy and gentle PROM to each digit in bilat hands, still requiring more gentle stretch at the L RF and R SF d/t consistently increased pain in these digits.  Pt feels leaving the ring off of his L hand RF has helped with the swelling in this digit over the last couple of days.  Good tolerance to therapeutic exercises this date, maintaining low reps and providing constant monitoring for activity tolerance to ensure no increases in pain.  Ktape applied to R LF end of session to reduce DIP radial deviation.  Pt continues to tolerate this well and is able to change and reapply his tape at home with minimal  difficulty, though he feels OT is able to obtain better positioning of fingertip than he is capable of with 1 hand.  Pt will continue to benefit from skilled OT to address pain, stiffness, and weakness in bilat hands in order to improve indep and tolerance to ADL/IADL tasks.    PERFORMANCE DEFICITS: in functional skills including ADLs, IADLs, coordination, dexterity, edema, ROM, strength, pain, flexibility, Fine motor control, body mechanics, decreased knowledge of precautions, decreased knowledge of use of DME, and UE functional use, and psychosocial skills including coping strategies, environmental adaptation, habits, and routines and behaviors.   IMPAIRMENTS: are limiting patient from ADLs, IADLs,  and leisure.   COMORBIDITIES: has co-morbidities such as HTN, Afib, hx of Nstemi, HLD that affects occupational performance. Patient will benefit from skilled OT to address above impairments and improve overall function.  MODIFICATION OR ASSISTANCE TO COMPLETE EVALUATION: No modification of tasks or assist necessary to complete an evaluation.  OT OCCUPATIONAL PROFILE AND HISTORY: Detailed assessment: Review of records and additional review of physical, cognitive, psychosocial history related to current functional performance.  CLINICAL DECISION MAKING: Moderate - several treatment options, min-mod task modification necessary  REHAB POTENTIAL: Good  EVALUATION COMPLEXITY: Moderate    PLAN:  OT FREQUENCY: 2x/week  OT DURATION: 12 weeks  PLANNED INTERVENTIONS: 97168 OT Re-evaluation, 97535 self care/ADL training, 95284 therapeutic exercise, 97530 therapeutic activity, 97140 manual therapy, 97018 paraffin, 13244 moist heat, 97010 cryotherapy, 97034 contrast bath, 97760 Orthotic Initial, 97763 Orthotic/Prosthetic subsequent, passive range of motion, coping strategies training, patient/family education, and DME and/or AE instructions  RECOMMENDED OTHER SERVICES: None at this time  CONSULTED AND  AGREED WITH PLAN OF CARE: Patient  PLAN FOR NEXT SESSION: see above  Marcus Sewer, MS, OTR/L  Casandra Claw, OT 05/31/2024, 10:19 AM

## 2024-06-03 ENCOUNTER — Ambulatory Visit

## 2024-06-03 DIAGNOSIS — R278 Other lack of coordination: Secondary | ICD-10-CM

## 2024-06-03 DIAGNOSIS — M19041 Primary osteoarthritis, right hand: Secondary | ICD-10-CM

## 2024-06-03 DIAGNOSIS — M6281 Muscle weakness (generalized): Secondary | ICD-10-CM | POA: Diagnosis not present

## 2024-06-03 NOTE — Therapy (Unsigned)
 OUTPATIENT OCCUPATIONAL THERAPY ORTHO TREATMENT NOTE  Patient Name: Gary Mueller MRN: [id number] DOB:1941-12-09, 83 y.o., male Today's Date: 06/04/2024  PCP: Dr. Eddy Goodell ([facility]) REFERRING PROVIDER: Adolphus Akin, MD (Rheumatology)  END OF SESSION:   OT End of Session - 06/04/24 1637     Visit Number 7    Number of Visits 24    Date for OT Re-Evaluation 08/05/24    OT Start Time 1448    OT Stop Time 1533    OT Time Calculation (min) 45 min    Activity Tolerance Patient tolerated treatment well    Behavior During Therapy [facility] for tasks assessed/performed             Past Medical History:  Diagnosis Date   Allergy    Anxiety    Arthritis    Cancer (HCC) 10/20/2016   Basal Cell Skin Cancer; lip, neck   Cystoid macular edema of left eye 07/07/2019   Depression    Dysrhythmia    A FIB   GERD (gastroesophageal reflux disease)    HOH (hard of hearing)    Bilateral   Hyperlipidemia    Hypertension    Patient denies   Hypothyroidism    IBS (irritable bowel syndrome)    Inguinal hernia    bilateral   Pneumonia    Thyroid  disease    Tinnitus of both ears    Past Surgical History:  Procedure Laterality Date   ANTERIOR VITRECTOMY Left 11/28/2018   Procedure: ANTERIOR VITRECTOMY;  Surgeon: Ola Berger, MD;  Location: [facility] ORS;  Service: Ophthalmology;  Laterality: Left;   CARDIAC CATHETERIZATION     CATARACT EXTRACTION W/PHACO Right 10/10/2018   Procedure: CATARACT EXTRACTION PHACO AND INTRAOCULAR LENS PLACEMENT (IOC);  Surgeon: Ola Berger, MD;  Location: [facility] ORS;  Service: Ophthalmology;  Laterality: Right;  US  00:41 CDE 6.87 Fluid pack lot # [id number]   CATARACT EXTRACTION W/PHACO Left 11/28/2018   Procedure: CATARACT EXTRACTION PHACO AND INTRAOCULAR LENS PLACEMENT (IOC)-LEFT;  Surgeon: Ola Berger, MD;  Location: [facility] OR[facility];  Service: Ophthalmology;  Laterality: Left;  US  00:47.7 CDE 8.92 Fluid Pack lot # [id number]   CHOLECYSTECTOMY  1968    CHONDROPLASTY Right 01/02/2018   Procedure: CHONDROPLASTY;  Surgeon: Arlyne Lame, MD;  Location: [facility] ORS;  Service: Orthopedics;  Laterality: Right;   COLONOSCOPY  2014   CORONARY STENT INTERVENTION N/A 10/19/2021   Procedure: CORONARY STENT INTERVENTION;  Surgeon: Percival Brace, MD;  Location: [facility] IN[facility];  Service: Cardiovascular;  Laterality: N/A;   EYE SURGERY     HERNIA REPAIR Left 04/18/1996   inguinal hernia repair/ Dr Marquita Situ   HERNIA REPAIR Right 02/26/1996   Dr Marquita Situ   HERNIA REPAIR Left 08/07/2002   Dr Marquita Situ   JOINT REPLACEMENT     KNEE ARTHROPLASTY Right 04/30/2020   Procedure: COMPUTER ASSISTED TOTAL KNEE ARTHROPLASTY;  Surgeon: Arlyne Lame, MD;  Location: [facility] OR[facility];  Service: Orthopedics;  Laterality: Right;   KNEE ARTHROSCOPY Right 01/02/2018   Procedure: ARTHROSCOPY KNEE;  Surgeon: Arlyne Lame, MD;  Location: [facility] OR[facility];  Service: Orthopedics;  Laterality: Right;   KNEE ARTHROSCOPY Left 02/14/2012   partial menisectomy and chondroplasty   KNEE ARTHROSCOPY WITH MEDIAL MENISECTOMY Right 01/02/2018   Procedure: KNEE ARTHROSCOPY WITH MEDIAL MENISECTOMY;  Surgeon: Arlyne Lame, MD;  Location: [facility] OR[facility];  Service: Orthopedics;  Laterality: Right;   LEFT HEART CATH AND CORONARY ANGIOGRAPHY N/A 10/19/2021   Procedure: LEFT HEART CATH AND CORONARY ANGIOGRAPHY;  Surgeon: Percival Brace, MD;  Location: [facility];  Service: Cardiovascular;  Laterality: N/A;   LEFT HEART CATH AND CORONARY ANGIOGRAPHY Left 10/10/2022   Procedure: LEFT HEART CATH AND CORONARY ANGIOGRAPHY;  Surgeon: Percival Brace, MD;  Location: [facility];  Service: Cardiovascular;  Laterality: Left;   TONSILLECTOMY     as a child   TOTAL KNEE ARTHROPLASTY Left 03/29/2015   [facility] Dr. Aubry Blase   VASECTOMY     Patient Active Problem List   Diagnosis Date Noted   Encounter for fitting and adjustment of hearing aid 04/04/2022   Sensorineural hearing loss, bilateral  04/04/2022   Atrial fibrillation (HCC) 04/04/2022   Hypertension 04/04/2022   Gastroesophageal reflux disease without esophagitis    Rheumatoid arthritis (HCC)    NSTEMI (non-ST elevated myocardial infarction) (HCC) 10/18/2021   Total knee replacement status 04/30/2020   Primary osteoarthritis of right knee 03/28/2020   CME (cystoid macular edema), left 07/07/2019   Nonexudative age-related macular degeneration, bilateral, intermediate dry stage 07/07/2019   SOB (shortness of breath) on exertion 08/22/2017   Chest pain with high risk for cardiac etiology 08/22/2017   Recurrent left inguinal hernia 01/04/2017   Allergic rhinitis 06/17/2015   CAD in native artery 06/17/2015   A-fib (HCC) 06/17/2015   Gonalgia 06/17/2015   Polypharmacy 06/17/2015   Cold sore 06/17/2015   Difficulty hearing 06/17/2015   HLD (hyperlipidemia) 06/17/2015   Essential hypertension 06/17/2015   Hypothyroidism 06/17/2015   Meniere's disease 06/17/2015   Buzzing in ear 06/17/2015   Beat, premature ventricular 06/17/2015   Status post total left knee replacement 04/13/2015   ONSET DATE: Beginning ~4-5 years ago, but worsening over the last 1-1.5 years  REFERRING DIAG: M19.041,M19.042 (ICD-10-CM) - Primary osteoarthritis of both hands   THERAPY DIAG:  Muscle weakness (generalized)  Other lack of coordination  Primary osteoarthritis of both hands  Rationale for Evaluation and Treatment: Rehabilitation  SUBJECTIVE:  SUBJECTIVE STATEMENT: Pt reports he had extreme pain (9-10/10) in his L RF PIP about 3 hours ago, and it seems to come out of nowhere.  Pt reports the pain has since subsided. Pt accompanied by: self  PERTINENT HISTORY: Per medical record from rheumatologist appt on 05/05/24:  Subjective:HPI  Gary Mueller is a 83 y.o. male is here today for follow up of inflammation arthritis, osteoarthritis. The patient's allergies, current medications, past family history, past medical history, past  social history, past surgical history and problem list were reviewed and updated as appropriate.   He is having pain of the left 2nd and 4th PIP joints. He is not able to form a fist. He has poor grip. He does take Tylenol  for pains.  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  -- Continue to monitor of the methotrexate  -- Previous cortisone injection has helped the hand joints  -- Can take Tylenol  for pains as needed  -- Refer to OT for further evaluation  Return in about 6 months (around 11/05/2024) for Routine Follow Up.   PRECAUTIONS: Other: maintain joint protection strategies/cumulative trauma prevention  RED FLAGS: None   WEIGHT BEARING RESTRICTIONS: No  PAIN: 06/03/24: 4/10 in bilat hands, up to 9-10/10 pain in L RF PIP joint ~3  hours ago Are you having pain? Yes: NPRS scale: 3-4 in bilat hands at rest, 7-8 with activity  Pain location: bilat hands, but increased tenderness to touch in the L 4th finger and thumb, R 5th digit (burning)   Pain description: sharp in both hands, burning in the R 5th digit and L thumb CMC  Aggravating factors: activity Relieving factors: elevating, rest, tylenol , heat   FALLS: Has patient fallen in last 6 months? No  LIVING ENVIRONMENT: Lives with: lives with their family, lives with adult son   PLOF: Retired from owning a Occupational [employer]; Currently plays golf a couple days per week (must use built up golf grips and has modified his grip), exercises at the [employer] at [employer] 3-4 times a week, walks dog 2-3x per week.    PATIENT GOALS: I'd like to be able to use them better, get more movement in my hands.   NEXT MD VISIT: Return to Dr. Lydia Sams in ~6  months, per chart (Rheumatology)  OBJECTIVE:  Note: Objective measures were completed at Evaluation unless otherwise noted.  HAND DOMINANCE: Right  ADLs: Eating: indep; pt reports no difficulties with cutting food or manipulating eating utensils Grooming: indep Upper body dressing: difficulty managing small buttons on a dress shirt; extra time needed Lower body dressing: pt tends to buy pants without buttons d/t difficulty with fasteners  Toileting: indep Bathing: pt reports that he frequently drops his soap d/t inability to fully close either hand Tub shower transfers: pt does have grab bars in shower, but reports he is still able to grasp bars without difficulty  Equipment: jar opener for kitchen  FUNCTIONAL OUTCOME MEASURES: MAM-20 for musculoskeletal conditions: TBD next session 05/15/24: 60/80  UPPER EXTREMITY ROM:     Active ROM Right eval Left eval  Thumb MCP (0-60)    Thumb IP (0-80)    Thumb Radial abd/add (0-55)     Thumb Palmar abd/add (0-45)     Thumb Opposition to Small Finger     Index MCP (0-90) 80* flexion  78* flexion  Index PIP (0-100)     Index DIP (0-70)      Long MCP (0-90)      Long PIP (0-100) 20* flexion     Long DIP (0-70) 35* of radial deviation (passively -25* from neutral)     Ring MCP (0-90)      Ring PIP (0-100)      Ring DIP (0-70)      Little MCP (0-90)      Little PIP (0-100)      Little DIP (0-70)      (Blank rows = not tested)  Tip to palm: R LF lacking 3cm  Tip to palm: L IF lacking 2.5 cm, L RF lacking 1.5 cm     Able to oppose all digits to thumb on each hand, but thumb touches medial aspect (not tip) of 5th digit DIP on each hand   UPPER EXTREMITY MMT:     MMT Right eval Left eval  Shoulder flexion    Shoulder abduction    Shoulder adduction    Shoulder extension    Shoulder internal rotation    Shoulder external rotation    Middle trapezius    Lower trapezius    Elbow flexion    Elbow extension    Wrist flexion 5  5  Wrist extension 5 5  Wrist ulnar deviation    Wrist radial deviation    Wrist pronation    Wrist supination    (  Blank rows = not tested)  HAND FUNCTION: Grip strength: Right: 35 lbs; Left: 28 lbs, Lateral pinch: Right: 13 lbs, Left: 11 lbs, and 3 point pinch: Right: 9 lbs, Left: 8 lbs  COORDINATION: 9 Hole Peg test: Right: 30 sec; Left: 30 sec  SENSATION: WFL  EDEMA:    L RF PIP circumference: 7.1 cm (Pt unable to doff wedding ring from L RF for 2 months now d/t increased swelling in digits) R RF PIP circumference: 6.9 cm  COGNITION: Overall cognitive status: Within functional limits for tasks assessed  OBSERVATIONS:  Pt pleasant/cooperative, and appears eager to improve functional use of hands with trial of OT.  TREATMENT DATE: 06/03/24 Moist heat used intermittently throughout session to manage pain and promote muscle relaxation for improving digit ROM in bilat hands.  Therapeutic Exercise: -Gentle passive stretching to digits 1-5 in each hand to promote flex/ext at the MP, PIP, and DIP joints for improving grasp/release, increased focus on L IF and R LF. -Gentle passive stretching at the R LF DIP to reduce radial deviation. -B grip strengthening: Hand gripper set at 11.2# for 2 trials on the L hand, and 17.9# x1 trial and 23.4# for a 2nd trial for R hand to remove jumbo pegs from pegboard.  Constant monitoring for tolerance of resistance and maintained low reps to avoid increased pain in hands. -R/L hand pinch strengthening: use of therapy resistant clothespins with green/blue/black pins only to target lateral and 3 point pinch, moving pins on/off a vertical dowel for 1 trial each hand for each pinch type.  Self Care: Education: condition management/pain management d/t recurrent episodes of severe pain.   -Review of activities completed earlier in the day to determine possible causation for episode of extreme pain earlier in the day -Continue to recommend use of heat, low  reps, avoid high reps of tight grasping, follow up with rheumatologist for Rx options                                                                                                                            PATIENT EDUCATION: Education details: pain management  Person educated: Patient Education method: Explanation Education comprehension: verbalized understanding  HOME EXERCISE PROGRAM: Yellow theraband; edema massage to digits in either hand  GOALS: Goals reviewed with patient? Yes  SHORT TERM GOALS: Target date: 06/24/24  Pt will be indep to perform HEP for improving bilat hand flexibility, strength, and coordination. Baseline: Eval: HEP not yet initiated Goal status: INITIAL  2.  Pt will be indep to verbalize 2-3 joint protection/cumulative trauma prevention strategies to reduce pain in R/L hands.  Baseline: Eval: Education not yet initiated Goal status: INITIAL  3.  Pt will be indep to verbalize and implement 1-2 pain management strategies to reduce pain in bilat hands.  Baseline: Eval: Education not yet initiated Goal status: INITIAL  4.  Pt will be indep to monitor skin integrity of R LF while donning/doffing finger splint in order  to reduce risk of skin breakdown. Baseline: Eval: Splint not yet issued/education not yet initiated Goal status: INITIAL  LONG TERM GOALS: Target date: 08/05/24  Pt will increase MAM-20 (for musculoskeletal conditions) score by 9 or more points in order to improve pt's perceived functional use of bilat hands with daily tasks. Baseline: Eval: MAM-20 to be given next session Goal status: INITIAL  2.  Pt will increase bilat grip strength by 10 or more lbs in each hand to ease ability to open containers. Baseline: Eval: R 35 lbs, L 28 lbs (below age range norms) Goal status: INITIAL  3.  Pt will increase lateral pinch strength by 3 lbs or more in each hand to ease ability to open food packages.  Baseline: Eval: R 13 lbs, L 11 lbs Goal status:  INITIAL  4.  Pt will increase bilat hand flexibility to enable ability to formulate a fully closed fist to reduce frequency of dropping smaller ADL supplies in either hand. Baseline: Eval: (Tip to palm) R LF lacking 3cm, L IF lacking 2.5 cm, L RF lacking 1.5 cm Goal status: INITIAL  5.  Pt will follow recommended wearing schedule of oval 8 splint with at least 75% adherence to reduce radial deviation in R LF DIP joint and reduce  risk of worsening hand deformity in R dominant hand. Baseline: Eval: Splint not yet issued; Pt presents with 35* of radial deviation on R LF DIP joint (able to passively reduce to 25*) Goal status: INITIAL  6.  Pt will tolerate manual therapy, therapeutic modalities, and exercises to decrease pain in bilat hands to a reported 3/10 pain or less with daily activities.   Baseline: Eval: 3-4/10 pain in bilat hands at rest, 7-8/10 pain with activity Goal status: INITIAL  7.  Pt will tolerate and implement edema management strategies to reduce edema in bilat hands to allow doffing/donning wedding ring. Baseline: Pt reports he's been unable to doff wedding ring for ~2 months, but previously donned/doffed ring daily; L RF PIP joint circumference 7.1 cm, R 6.9 cm Goal status: INITIAL    ASSESSMENT:  CLINICAL IMPRESSION: Pt reported another episode of extreme pain in the L RF PIP joint ~3 hours ago with pain reaching 9-10/10 pain.  Pt reports this has since subsided but the pain seems to come out of nowhere.  Pt receptive to follow up with rheumatologist for Rx options.  OT reviewed pt's activities which he had completed earlier in the day to determine if pain exacerbation may have been from overuse, but OT/pt unable to identify any specific activities as pt is consistent to implement good joint protection and is consistent to avoid overuse of his hands.  Pt does continue to play golf, however pt does use clubs with built up grips and reported no pain following his most recent  round of golf.  Pt receptive to follow up with MD. Constant monitoring today for increases in pain with above noted activities.  Pt tolerated all activities well, including ability to increase resistance on R hand for hand gripper activity noted above without any increases in pain.  Pt will continue to benefit from skilled OT to address pain, stiffness, and weakness in bilat hands in order to improve indep and tolerance to ADL/IADL tasks.    PERFORMANCE DEFICITS: in functional skills including ADLs, IADLs, coordination, dexterity, edema, ROM, strength, pain, flexibility, Fine motor control, body mechanics, decreased knowledge of precautions, decreased knowledge of use of DME, and UE functional use, and psychosocial skills including  coping strategies, environmental adaptation, habits, and routines and behaviors.   IMPAIRMENTS: are limiting patient from ADLs, IADLs, and leisure.   COMORBIDITIES: has co-morbidities such as HTN, Afib, hx of Nstemi, HLD that affects occupational performance. Patient will benefit from skilled OT to address above impairments and improve overall function.  MODIFICATION OR ASSISTANCE TO COMPLETE EVALUATION: No modification of tasks or assist necessary to complete an evaluation.  OT OCCUPATIONAL PROFILE AND HISTORY: Detailed assessment: Review of records and additional review of physical, cognitive, psychosocial history related to current functional performance.  CLINICAL DECISION MAKING: Moderate - several treatment options, min-mod task modification necessary  REHAB POTENTIAL: Good  EVALUATION COMPLEXITY: Moderate    PLAN:  OT FREQUENCY: 2x/week  OT DURATION: 12 weeks  PLANNED INTERVENTIONS: 97168 OT Re-evaluation, 97535 self care/ADL training, 63016 therapeutic exercise, 97530 therapeutic activity, 97140 manual therapy, 97018 paraffin, 01093 moist heat, 97010 cryotherapy, 97034 contrast bath, 97760 Orthotic Initial, 97763 Orthotic/Prosthetic subsequent, passive  range of motion, coping strategies training, patient/family education, and DME and/or AE instructions  RECOMMENDED OTHER SERVICES: None at this time  CONSULTED AND AGREED WITH PLAN OF CARE: Patient  PLAN FOR NEXT SESSION: see above  Marcus Sewer, MS, OTR/L  Casandra Claw, OT 06/04/2024, 4:38 PM

## 2024-06-05 ENCOUNTER — Ambulatory Visit

## 2024-06-05 DIAGNOSIS — M19041 Primary osteoarthritis, right hand: Secondary | ICD-10-CM

## 2024-06-05 DIAGNOSIS — M6281 Muscle weakness (generalized): Secondary | ICD-10-CM

## 2024-06-05 DIAGNOSIS — R278 Other lack of coordination: Secondary | ICD-10-CM

## 2024-06-06 NOTE — Therapy (Signed)
 OUTPATIENT OCCUPATIONAL THERAPY ORTHO TREATMENT NOTE  Patient Name: Gary Mueller MRN: [id number] DOB:1941/02/24, 83 y.o., male Today's Date: 06/06/2024  PCP: Dr. Eddy Mueller ([facility]) REFERRING PROVIDER: Adolphus Akin, MD (Rheumatology)  END OF SESSION:   OT End of Session - 06/06/24 2103     Visit Number 8    Number of Visits 24    Date for OT Re-Evaluation 08/05/24    OT Start Time 1530    OT Stop Time 1615    OT Time Calculation (min) 45 min    Activity Tolerance Patient tolerated treatment well    Behavior During Therapy [facility] for tasks assessed/performed          Past Medical History:  Diagnosis Date   Allergy    Anxiety    Arthritis    Cancer (HCC) 10/20/2016   Basal Cell Skin Cancer; lip, neck   Cystoid macular edema of left eye 07/07/2019   Depression    Dysrhythmia    A FIB   GERD (gastroesophageal reflux disease)    HOH (hard of hearing)    Bilateral   Hyperlipidemia    Hypertension    Patient denies   Hypothyroidism    IBS (irritable bowel syndrome)    Inguinal hernia    bilateral   Pneumonia    Thyroid  disease    Tinnitus of both ears    Past Surgical History:  Procedure Laterality Date   ANTERIOR VITRECTOMY Left 11/28/2018   Procedure: ANTERIOR VITRECTOMY;  Surgeon: Gary Berger, MD;  Location: [facility] OR[facility];  Service: Ophthalmology;  Laterality: Left;   CARDIAC CATHETERIZATION     CATARACT EXTRACTION W/PHACO Right 10/10/2018   Procedure: CATARACT EXTRACTION PHACO AND INTRAOCULAR LENS PLACEMENT (IOC);  Surgeon: Gary Berger, MD;  Location: [facility];  Service: Ophthalmology;  Laterality: Right;  US  00:41 CDE 6.87 Fluid pack lot # [id number]   CATARACT EXTRACTION W/PHACO Left 11/28/2018   Procedure: CATARACT EXTRACTION PHACO AND INTRAOCULAR LENS PLACEMENT (IOC)-LEFT;  Surgeon: Gary Berger, MD;  Location: [facility];  Service: Ophthalmology;  Laterality: Left;  US  00:47.7 CDE 8.92 Fluid Pack lot # [id number]   CHOLECYSTECTOMY  1968   CHONDROPLASTY  Right 01/02/2018   Procedure: CHONDROPLASTY;  Surgeon: Gary Lame, MD;  Location: [facility];  Service: Orthopedics;  Laterality: Right;   COLONOSCOPY  2014   CORONARY STENT INTERVENTION N/A 10/19/2021   Procedure: CORONARY STENT INTERVENTION;  Surgeon: Gary Brace, MD;  Location: [facility];  Service: Cardiovascular;  Laterality: N/A;   EYE SURGERY     HERNIA REPAIR Left 04/18/1996   inguinal hernia repair/ Dr Gary Mueller   HERNIA REPAIR Right 02/26/1996   Dr Gary Mueller   HERNIA REPAIR Left 08/07/2002   Dr Gary Mueller   JOINT REPLACEMENT     KNEE ARTHROPLASTY Right 04/30/2020   Procedure: COMPUTER ASSISTED TOTAL KNEE ARTHROPLASTY;  Surgeon: Gary Lame, MD;  Location: [facility];  Service: Orthopedics;  Laterality: Right;   KNEE ARTHROSCOPY Right 01/02/2018   Procedure: ARTHROSCOPY KNEE;  Surgeon: Gary Lame, MD;  Location: [facility];  Service: Orthopedics;  Laterality: Right;   KNEE ARTHROSCOPY Left 02/14/2012   partial menisectomy and chondroplasty   KNEE ARTHROSCOPY WITH MEDIAL MENISECTOMY Right 01/02/2018   Procedure: KNEE ARTHROSCOPY WITH MEDIAL MENISECTOMY;  Surgeon: Gary Lame, MD;  Location: [facility];  Service: Orthopedics;  Laterality: Right;   LEFT HEART CATH AND CORONARY ANGIOGRAPHY N/A 10/19/2021   Procedure: LEFT HEART CATH AND CORONARY ANGIOGRAPHY;  Surgeon: Gary Brace,  MD;  Location: [facility];  Service: Cardiovascular;  Laterality: N/A;   LEFT HEART CATH AND CORONARY ANGIOGRAPHY Left 10/10/2022   Procedure: LEFT HEART CATH AND CORONARY ANGIOGRAPHY;  Surgeon: Gary Brace, MD;  Location: [facility];  Service: Cardiovascular;  Laterality: Left;   TONSILLECTOMY     as a child   TOTAL KNEE ARTHROPLASTY Left 03/29/2015   [facility] Dr. Aubry Mueller   VASECTOMY     Patient Active Problem List   Diagnosis Date Noted   Encounter for fitting and adjustment of hearing aid 04/04/2022   Sensorineural hearing loss, bilateral 04/04/2022    Atrial fibrillation (HCC) 04/04/2022   Hypertension 04/04/2022   Gastroesophageal reflux disease without esophagitis    Rheumatoid arthritis (HCC)    NSTEMI (non-ST elevated myocardial infarction) (HCC) 10/18/2021   Total knee replacement status 04/30/2020   Primary osteoarthritis of right knee 03/28/2020   CME (cystoid macular edema), left 07/07/2019   Nonexudative age-related macular degeneration, bilateral, intermediate dry stage 07/07/2019   SOB (shortness of breath) on exertion 08/22/2017   Chest pain with high risk for cardiac etiology 08/22/2017   Recurrent left inguinal hernia 01/04/2017   Allergic rhinitis 06/17/2015   CAD in native artery 06/17/2015   A-fib (HCC) 06/17/2015   Gonalgia 06/17/2015   Polypharmacy 06/17/2015   Cold sore 06/17/2015   Difficulty hearing 06/17/2015   HLD (hyperlipidemia) 06/17/2015   Essential hypertension 06/17/2015   Hypothyroidism 06/17/2015   Meniere's disease 06/17/2015   Buzzing in ear 06/17/2015   Beat, premature ventricular 06/17/2015   Status post total left knee replacement 04/13/2015   ONSET DATE: Beginning ~4-5 years ago, but worsening over the last 1-1.5 years  REFERRING DIAG: M19.041,M19.042 (ICD-10-CM) - Primary osteoarthritis of both hands   THERAPY DIAG:  Muscle weakness (generalized)  Other lack of coordination  Primary osteoarthritis of both hands  Rationale for Evaluation and Treatment: Rehabilitation  SUBJECTIVE:  SUBJECTIVE STATEMENT: Pt reports that he did reach out to his rheumatologist to discuss pain management options, but has not yet heard back. Pt accompanied by: self  PERTINENT HISTORY: Per medical record from rheumatologist appt on 05/05/24:  Subjective:HPI  Gary Mueller is a 83 y.o. male is here today for follow up of inflammation arthritis, osteoarthritis. The patient's allergies, current medications, past family history, past medical history, past social history, past surgical history and problem  list were reviewed and updated as appropriate.   He is having pain of the left 2nd and 4th PIP joints. He is not able to form a fist. He has poor grip. He does take Tylenol  for pains.  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  -- Continue to monitor of the methotrexate  -- Previous cortisone injection has helped the hand joints  -- Can take Tylenol  for pains as needed  -- Refer to OT for further evaluation  Return in about 6 months (around 11/05/2024) for Routine Follow Up.   PRECAUTIONS: Other: maintain joint protection strategies/cumulative trauma prevention  RED FLAGS: None   WEIGHT BEARING RESTRICTIONS: No  PAIN: 06/05/24: 3-4/10 in bilat hands Are you having pain? Yes: NPRS scale: 3-4 in bilat hands at rest, 7-8 with activity  Pain location: bilat hands, but increased tenderness  to touch in the L 4th finger and thumb, R 5th digit (burning)   Pain description: sharp in both hands, burning in the R 5th digit and L thumb CMC  Aggravating factors: activity Relieving factors: elevating, rest, tylenol , heat   FALLS: Has patient fallen in last 6 months? No  LIVING ENVIRONMENT: Lives with: lives with their family, lives with adult son   PLOF: Retired from owning a Occupational psychologist; Currently plays golf a couple days per week (must use built up golf grips and has modified his grip), exercises at the [employer] at [facility] 3-4 times a week, walks dog 2-3x per week.    PATIENT GOALS: I'd like to be able to use them better, get more movement in my hands.   NEXT MD VISIT: Return to Dr. Lydia Sams in ~6 months, per chart (Rheumatology)  OBJECTIVE:  Note: Objective measures were completed at  Evaluation unless otherwise noted.  HAND DOMINANCE: Right  ADLs: Eating: indep; pt reports no difficulties with cutting food or manipulating eating utensils Grooming: indep Upper body dressing: difficulty managing small buttons on a dress shirt; extra time needed Lower body dressing: pt tends to buy pants without buttons d/t difficulty with fasteners  Toileting: indep Bathing: pt reports that he frequently drops his soap d/t inability to fully close either hand Tub shower transfers: pt does have grab bars in shower, but reports he is still able to grasp bars without difficulty  Equipment: jar opener for kitchen  FUNCTIONAL OUTCOME MEASURES: MAM-20 for musculoskeletal conditions: TBD next session 05/15/24: 60/80  UPPER EXTREMITY ROM:     Active ROM Right eval Left eval  Thumb MCP (0-60)    Thumb IP (0-80)    Thumb Radial abd/add (0-55)     Thumb Palmar abd/add (0-45)     Thumb Opposition to Small Finger     Index MCP (0-90) 80* flexion  78* flexion  Index PIP (0-100)     Index DIP (0-70)      Long MCP (0-90)      Long PIP (0-100) 20* flexion     Long DIP (0-70) 35* of radial deviation (passively -25* from neutral)     Ring MCP (0-90)      Ring PIP (0-100)      Ring DIP (0-70)      Little MCP (0-90)      Little PIP (0-100)      Little DIP (0-70)      (Blank rows = not tested)  Tip to palm: R LF lacking 3cm  Tip to palm: L IF lacking 2.5 cm, L RF lacking 1.5 cm     Able to oppose all digits to thumb on each hand, but thumb touches medial aspect (not tip) of 5th digit DIP on each hand   UPPER EXTREMITY MMT:     MMT Right eval Left eval  Shoulder flexion    Shoulder abduction    Shoulder adduction    Shoulder extension    Shoulder internal rotation    Shoulder external rotation    Middle trapezius    Lower trapezius    Elbow flexion    Elbow extension    Wrist flexion 5 5  Wrist extension 5 5  Wrist ulnar deviation    Wrist radial deviation    Wrist  pronation    Wrist supination    (Blank rows = not tested)  HAND FUNCTION: Grip strength: Right: 35 lbs; Left: 28 lbs, Lateral pinch: Right: 13 lbs, Left: 11  lbs, and 3 point pinch: Right: 9 lbs, Left: 8 lbs  COORDINATION: 9 Hole Peg test: Right: 30 sec; Left: 30 sec  SENSATION: WFL  EDEMA:    L RF PIP circumference: 7.1 cm (Pt unable to doff wedding ring from L RF for 2 months now d/t increased swelling in digits) R RF PIP circumference: 6.9 cm  COGNITION: Overall cognitive status: Within functional limits for tasks assessed  OBSERVATIONS:  Pt pleasant/cooperative, and appears eager to improve functional use of hands with trial of OT.  TREATMENT DATE: 06/05/24 Moist heat used intermittently throughout session to manage pain and promote muscle relaxation for improving digit ROM in bilat hands.  Manual Therapy: -Performed edema massage and soft tissue massage to MP, PIP, DIP joints in each digit of R/L hands, working to reduce pain and swelling to improve digit ROM for functional activities.  Therapeutic Exercise: -Gentle passive stretching to digits 1-5 in each hand to promote flex/ext at the MP, PIP, and DIP joints for improving grasp/release, increased focus on L IF and R LF. -Gentle passive stretching at the R LF DIP to reduce radial deviation. -B grip strengthening: Hand gripper set at moderate resistance with 2 red bands for 5 sets 10 reps, alternating hands with each set.  Constant monitoring for tolerance of resistance to avoid increased pain in hands. -Recommendation for prolonged digit flexion stretch within pt's heated mitts at home: Closed fists x3-5 min on bilat hands/to tolerance.                                                                                                                            PATIENT EDUCATION: Education details: HEP Person educated: Patient Education method: Explanation Education comprehension: verbalized understanding  HOME EXERCISE  PROGRAM: Yellow theraband; edema massage to digits in either hand  GOALS: Goals reviewed with patient? Yes  SHORT TERM GOALS: Target date: 06/24/24  Pt will be indep to perform HEP for improving bilat hand flexibility, strength, and coordination. Baseline: Eval: HEP not yet initiated Goal status: INITIAL  2.  Pt will be indep to verbalize 2-3 joint protection/cumulative trauma prevention strategies to reduce pain in R/L hands.  Baseline: Eval: Education not yet initiated Goal status: INITIAL  3.  Pt will be indep to verbalize and implement 1-2 pain management strategies to reduce pain in bilat hands.  Baseline: Eval: Education not yet initiated Goal status: INITIAL  4.  Pt will be indep to monitor skin integrity of R LF while donning/doffing finger splint in order to reduce risk of skin breakdown. Baseline: Eval: Splint not yet issued/education not yet initiated Goal status: INITIAL  LONG TERM GOALS: Target date: 08/05/24  Pt will increase MAM-20 (for musculoskeletal conditions) score by 9 or more points in order to improve pt's perceived functional use of bilat hands with daily tasks. Baseline: Eval: MAM-20 to be given next session Goal status: INITIAL  2.  Pt will increase bilat grip strength by 10 or more lbs  in each hand to ease ability to open containers. Baseline: Eval: R 35 lbs, L 28 lbs (below age range norms) Goal status: INITIAL  3.  Pt will increase lateral pinch strength by 3 lbs or more in each hand to ease ability to open food packages.  Baseline: Eval: R 13 lbs, L 11 lbs Goal status: INITIAL  4.  Pt will increase bilat hand flexibility to enable ability to formulate a fully closed fist to reduce frequency of dropping smaller ADL supplies in either hand. Baseline: Eval: (Tip to palm) R LF lacking 3cm, L IF lacking 2.5 cm, L RF lacking 1.5 cm Goal status: INITIAL  5.  Pt will follow recommended wearing schedule of oval 8 splint with at least 75% adherence to  reduce radial deviation in R LF DIP joint and reduce  risk of worsening hand deformity in R dominant hand. Baseline: Eval: Splint not yet issued; Pt presents with 35* of radial deviation on R LF DIP joint (able to passively reduce to 25*) Goal status: INITIAL  6.  Pt will tolerate manual therapy, therapeutic modalities, and exercises to decrease pain in bilat hands to a reported 3/10 pain or less with daily activities.   Baseline: Eval: 3-4/10 pain in bilat hands at rest, 7-8/10 pain with activity Goal status: INITIAL  7.  Pt will tolerate and implement edema management strategies to reduce edema in bilat hands to allow doffing/donning wedding ring. Baseline: Pt reports he's been unable to doff wedding ring for ~2 months, but previously donned/doffed ring daily; L RF PIP joint circumference 7.1 cm, R 6.9 cm Goal status: INITIAL    ASSESSMENT:  CLINICAL IMPRESSION: Pt reports no extreme pain yesterday in L RF as experienced earlier in the week.  Pt will wait to hear from rheumatologist re: pain management options.  Good tolerance to manual therapy with addition of Biofreeze to R/L hand digits.  Pt continues to tolerate gentle strengthening and passive stretching to bilat hands.  Pt verbalizes improving digit flexion on each hand.  Pt will continue to benefit from skilled OT to address pain, stiffness, and weakness in bilat hands in order to improve indep and tolerance to ADL/IADL tasks.    PERFORMANCE DEFICITS: in functional skills including ADLs, IADLs, coordination, dexterity, edema, ROM, strength, pain, flexibility, Fine motor control, body mechanics, decreased knowledge of precautions, decreased knowledge of use of DME, and UE functional use, and psychosocial skills including coping strategies, environmental adaptation, habits, and routines and behaviors.   IMPAIRMENTS: are limiting patient from ADLs, IADLs, and leisure.   COMORBIDITIES: has co-morbidities such as HTN, Afib, hx of Nstemi,  HLD that affects occupational performance. Patient will benefit from skilled OT to address above impairments and improve overall function.  MODIFICATION OR ASSISTANCE TO COMPLETE EVALUATION: No modification of tasks or assist necessary to complete an evaluation.  OT OCCUPATIONAL PROFILE AND HISTORY: Detailed assessment: Review of records and additional review of physical, cognitive, psychosocial history related to current functional performance.  CLINICAL DECISION MAKING: Moderate - several treatment options, min-mod task modification necessary  REHAB POTENTIAL: Good  EVALUATION COMPLEXITY: Moderate    PLAN:  OT FREQUENCY: 2x/week  OT DURATION: 12 weeks  PLANNED INTERVENTIONS: 97168 OT Re-evaluation, 97535 self care/ADL training, 86578 therapeutic exercise, 97530 therapeutic activity, 97140 manual therapy, 97018 paraffin, 46962 moist heat, 97010 cryotherapy, 97034 contrast bath, 97760 Orthotic Initial, 97763 Orthotic/Prosthetic subsequent, passive range of motion, coping strategies training, patient/family education, and DME and/or AE instructions  RECOMMENDED OTHER SERVICES: None at this  time  CONSULTED AND AGREED WITH PLAN OF CARE: Patient  PLAN FOR NEXT SESSION: see above  Marcus Sewer, MS, OTR/L  Casandra Claw, OT 06/06/2024, 9:05 PM

## 2024-06-10 ENCOUNTER — Ambulatory Visit

## 2024-06-10 DIAGNOSIS — M6281 Muscle weakness (generalized): Secondary | ICD-10-CM | POA: Diagnosis not present

## 2024-06-10 DIAGNOSIS — R278 Other lack of coordination: Secondary | ICD-10-CM

## 2024-06-10 DIAGNOSIS — M19041 Primary osteoarthritis, right hand: Secondary | ICD-10-CM

## 2024-06-12 ENCOUNTER — Ambulatory Visit

## 2024-06-12 DIAGNOSIS — M6281 Muscle weakness (generalized): Secondary | ICD-10-CM

## 2024-06-12 DIAGNOSIS — M19041 Primary osteoarthritis, right hand: Secondary | ICD-10-CM

## 2024-06-12 DIAGNOSIS — R278 Other lack of coordination: Secondary | ICD-10-CM

## 2024-06-12 NOTE — Therapy (Signed)
 OUTPATIENT OCCUPATIONAL THERAPY ORTHO TREATMENT NOTE  Patient Name: Gary Mueller MRN: [id number] DOB:07-03-1941, 83 y.o., male Today's Date: 06/12/2024  PCP: Dr. Eddy Goodell ([facility]) REFERRING PROVIDER: Adolphus Akin, MD (Rheumatology)  END OF SESSION:   OT End of Session - 06/12/24 2012     Visit Number 9    Number of Visits 24    Date for OT Re-Evaluation 08/05/24    OT Start Time 1502    OT Stop Time 1530    OT Time Calculation (min) 28 min    Activity Tolerance Patient tolerated treatment well    Behavior During Therapy [facility] for tasks assessed/performed          Past Medical History:  Diagnosis Date   Allergy    Anxiety    Arthritis    Cancer (HCC) 10/20/2016   Basal Cell Skin Cancer; lip, neck   Cystoid macular edema of left eye 07/07/2019   Depression    Dysrhythmia    A FIB   GERD (gastroesophageal reflux disease)    HOH (hard of hearing)    Bilateral   Hyperlipidemia    Hypertension    Patient denies   Hypothyroidism    IBS (irritable bowel syndrome)    Inguinal hernia    bilateral   Pneumonia    Thyroid  disease    Tinnitus of both ears    Past Surgical History:  Procedure Laterality Date   ANTERIOR VITRECTOMY Left 11/28/2018   Procedure: ANTERIOR VITRECTOMY;  Surgeon: Ola Berger, MD;  Location: [facility] ORS;  Service: Ophthalmology;  Laterality: Left;   CARDIAC CATHETERIZATION     CATARACT EXTRACTION W/PHACO Right 10/10/2018   Procedure: CATARACT EXTRACTION PHACO AND INTRAOCULAR LENS PLACEMENT (IOC);  Surgeon: Ola Berger, MD;  Location: [facility] OR[facility];  Service: Ophthalmology;  Laterality: Right;  US  00:41 CDE 6.87 Fluid pack lot # [id number]   CATARACT EXTRACTION W/PHACO Left 11/28/2018   Procedure: CATARACT EXTRACTION PHACO AND INTRAOCULAR LENS PLACEMENT (IOC)-LEFT;  Surgeon: Ola Berger, MD;  Location: [facility] OR[facility];  Service: Ophthalmology;  Laterality: Left;  US  00:47.7 CDE 8.92 Fluid Pack lot # [id number]   CHOLECYSTECTOMY  1968   CHONDROPLASTY  Right 01/02/2018   Procedure: CHONDROPLASTY;  Surgeon: Arlyne Lame, MD;  Location: [facility] OR[facility];  Service: Orthopedics;  Laterality: Right;   COLONOSCOPY  2014   CORONARY STENT INTERVENTION N/A 10/19/2021   Procedure: CORONARY STENT INTERVENTION;  Surgeon: Percival Brace, MD;  Location: [facility] INV[facility]IVE [facility];  Service: Cardiovascular;  Laterality: N/A;   EYE SURGERY     HERNIA REPAIR Left 04/18/1996   inguinal hernia repair/ Dr Marquita Situ   HERNIA REPAIR Right 02/26/1996   Dr Marquita Situ   HERNIA REPAIR Left 08/07/2002   Dr Marquita Situ   JOINT REPLACEMENT     KNEE ARTHROPLASTY Right 04/30/2020   Procedure: COMPUTER ASSISTED TOTAL KNEE ARTHROPLASTY;  Surgeon: Arlyne Lame, MD;  Location: [facility];  Service: Orthopedics;  Laterality: Right;   KNEE ARTHROSCOPY Right 01/02/2018   Procedure: ARTHROSCOPY KNEE;  Surgeon: Arlyne Lame, MD;  Location: [facility] OR[facility];  Service: Orthopedics;  Laterality: Right;   KNEE ARTHROSCOPY Left 02/14/2012   partial menisectomy and chondroplasty   KNEE ARTHROSCOPY WITH MEDIAL MENISECTOMY Right 01/02/2018   Procedure: KNEE ARTHROSCOPY WITH MEDIAL MENISECTOMY;  Surgeon: Arlyne Lame, MD;  Location: [facility];  Service: Orthopedics;  Laterality: Right;   LEFT HEART CATH AND CORONARY ANGIOGRAPHY N/A 10/19/2021   Procedure: LEFT HEART CATH AND CORONARY ANGIOGRAPHY;  Surgeon: Percival Brace,  MD;  Location: [facility];  Service: Cardiovascular;  Laterality: N/A;   LEFT HEART CATH AND CORONARY ANGIOGRAPHY Left 10/10/2022   Procedure: LEFT HEART CATH AND CORONARY ANGIOGRAPHY;  Surgeon: Percival Brace, MD;  Location: [facility];  Service: Cardiovascular;  Laterality: Left;   TONSILLECTOMY     as a child   TOTAL KNEE ARTHROPLASTY Left 03/29/2015   [facility] Dr. Aubry Blase   VASECTOMY     Patient Active Problem List   Diagnosis Date Noted   Encounter for fitting and adjustment of hearing aid 04/04/2022   Sensorineural hearing loss, bilateral 04/04/2022    Atrial fibrillation (HCC) 04/04/2022   Hypertension 04/04/2022   Gastroesophageal reflux disease without esophagitis    Rheumatoid arthritis (HCC)    NSTEMI (non-ST elevated myocardial infarction) (HCC) 10/18/2021   Total knee replacement status 04/30/2020   Primary osteoarthritis of right knee 03/28/2020   CME (cystoid macular edema), left 07/07/2019   Nonexudative age-related macular degeneration, bilateral, intermediate dry stage 07/07/2019   SOB (shortness of breath) on exertion 08/22/2017   Chest pain with high risk for cardiac etiology 08/22/2017   Recurrent left inguinal hernia 01/04/2017   Allergic rhinitis 06/17/2015   CAD in native artery 06/17/2015   A-fib (HCC) 06/17/2015   Gonalgia 06/17/2015   Polypharmacy 06/17/2015   Cold sore 06/17/2015   Difficulty hearing 06/17/2015   HLD (hyperlipidemia) 06/17/2015   Essential hypertension 06/17/2015   Hypothyroidism 06/17/2015   Meniere's disease 06/17/2015   Buzzing in ear 06/17/2015   Beat, premature ventricular 06/17/2015   Status post total left knee replacement 04/13/2015   ONSET DATE: Beginning ~4-5 years ago, but worsening over the last 1-1.5 years  REFERRING DIAG: M19.041,M19.042 (ICD-10-CM) - Primary osteoarthritis of both hands   THERAPY DIAG:  Muscle weakness (generalized)  Other lack of coordination  Primary osteoarthritis of both hands  Rationale for Evaluation and Treatment: Rehabilitation  SUBJECTIVE:  SUBJECTIVE STATEMENT: Pt arrived late d/t having accidentally mistaken schedule start time, but agreeable to shortened tx session this date as a result.  Pt accompanied by: self  PERTINENT HISTORY: Per medical record from rheumatologist appt on 05/05/24:  Subjective:HPI  Gary Mueller is a 83 y.o. male is here today for follow up of inflammation arthritis, osteoarthritis. The patient's allergies, current medications, past family history, past medical history, past social history, past surgical history  and problem list were reviewed and updated as appropriate.   He is having pain of the left 2nd and 4th PIP joints. He is not able to form a fist. He has poor grip. He does take Tylenol  for pains.  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  -- Continue to monitor of the methotrexate  -- Previous cortisone injection has helped the hand joints  -- Can take Tylenol  for pains as needed  -- Refer to OT for further evaluation  Return in about 6 months (around 11/05/2024) for Routine Follow Up.   PRECAUTIONS: Other: maintain joint protection strategies/cumulative trauma prevention  RED FLAGS: None   WEIGHT BEARING RESTRICTIONS: No  PAIN: 06/10/24: 3-4/10 in bilat hands Are you having pain? Yes: NPRS scale: 3-4 in bilat hands at rest, 7-8 with activity  Pain location: bilat hands, but increased  tenderness to touch in the L 4th finger and thumb, R 5th digit (burning)   Pain description: sharp in both hands, burning in the R 5th digit and L thumb CMC  Aggravating factors: activity Relieving factors: elevating, rest, tylenol , heat   FALLS: Has patient fallen in last 6 months? No  LIVING ENVIRONMENT: Lives with: lives with their family, lives with adult son   PLOF: Retired from owning a Occupational psychologist; Currently plays golf a couple days per week (must use built up golf grips and has modified his grip), exercises at the [employer] at [facility] 3-4 times a week, walks dog 2-3x per week.    PATIENT GOALS: I'd like to be able to use them better, get more movement in my hands.   NEXT MD VISIT: Return to Dr. Lydia Sams in ~6 months, per chart (Rheumatology)  OBJECTIVE:  Note: Objective measures were completed  at Evaluation unless otherwise noted.  HAND DOMINANCE: Right  ADLs: Eating: indep; pt reports no difficulties with cutting food or manipulating eating utensils Grooming: indep Upper body dressing: difficulty managing small buttons on a dress shirt; extra time needed Lower body dressing: pt tends to buy pants without buttons d/t difficulty with fasteners  Toileting: indep Bathing: pt reports that he frequently drops his soap d/t inability to fully close either hand Tub shower transfers: pt does have grab bars in shower, but reports he is still able to grasp bars without difficulty  Equipment: jar opener for kitchen  FUNCTIONAL OUTCOME MEASURES: MAM-20 for musculoskeletal conditions: TBD next session 05/15/24: 60/80  UPPER EXTREMITY ROM:     Active ROM Right eval Left eval  Thumb MCP (0-60)    Thumb IP (0-80)    Thumb Radial abd/add (0-55)     Thumb Palmar abd/add (0-45)     Thumb Opposition to Small Finger     Index MCP (0-90) 80* flexion  78* flexion  Index PIP (0-100)     Index DIP (0-70)      Long MCP (0-90)      Long PIP (0-100) 20* flexion     Long DIP (0-70) 35* of radial deviation (passively -25* from neutral)     Ring MCP (0-90)      Ring PIP (0-100)      Ring DIP (0-70)      Little MCP (0-90)      Little PIP (0-100)      Little DIP (0-70)      (Blank rows = not tested)  Tip to palm: R LF lacking 3cm  Tip to palm: L IF lacking 2.5 cm, L RF lacking 1.5 cm     Able to oppose all digits to thumb on each hand, but thumb touches medial aspect (not tip) of 5th digit DIP on each hand   UPPER EXTREMITY MMT:     MMT Right eval Left eval  Shoulder flexion    Shoulder abduction    Shoulder adduction    Shoulder extension    Shoulder internal rotation    Shoulder external rotation    Middle trapezius    Lower trapezius    Elbow flexion    Elbow extension    Wrist flexion 5 5  Wrist extension 5 5  Wrist ulnar deviation    Wrist radial deviation    Wrist  pronation    Wrist supination    (Blank rows = not tested)  HAND FUNCTION: Grip strength: Right: 35 lbs; Left: 28 lbs, Lateral pinch: Right: 13 lbs, Left:  11 lbs, and 3 point pinch: Right: 9 lbs, Left: 8 lbs  COORDINATION: 9 Hole Peg test: Right: 30 sec; Left: 30 sec  SENSATION: WFL  EDEMA:    L RF PIP circumference: 7.1 cm (Pt unable to doff wedding ring from L RF for 2 months now d/t increased swelling in digits) R RF PIP circumference: 6.9 cm  COGNITION: Overall cognitive status: Within functional limits for tasks assessed  OBSERVATIONS:  Pt pleasant/cooperative, and appears eager to improve functional use of hands with trial of OT.  TREATMENT DATE: 06/10/24 Moist heat used intermittently throughout session to manage pain and promote muscle relaxation for improving digit ROM in bilat hands.  Therapeutic Exercise: -Gentle passive stretching to digits 1-5 in each hand to promote flex/ext at the MP, PIP, and DIP joints for improving grasp/release; increased focus on L IF and R LF with gentle joint distraction during flexion at each joint for increased range. -Reps of R/L active assisted composite fisting/digit ext for increasing range tip to palm for holding/storing ADL supplies in either hand. -Gentle passive stretching at the R LF DIP to reduce radial deviation.                                                                                                                            PATIENT EDUCATION: Education details: HEP Person educated: Patient Education method: Explanation, vc Education comprehension: verbalized understanding  HOME EXERCISE PROGRAM: Yellow theraband; edema massage to digits in either hand  GOALS: Goals reviewed with patient? Yes  SHORT TERM GOALS: Target date: 06/24/24  Pt will be indep to perform HEP for improving bilat hand flexibility, strength, and coordination. Baseline: Eval: HEP not yet initiated Goal status: INITIAL  2.  Pt will be indep  to verbalize 2-3 joint protection/cumulative trauma prevention strategies to reduce pain in R/L hands.  Baseline: Eval: Education not yet initiated Goal status: INITIAL  3.  Pt will be indep to verbalize and implement 1-2 pain management strategies to reduce pain in bilat hands.  Baseline: Eval: Education not yet initiated Goal status: INITIAL  4.  Pt will be indep to monitor skin integrity of R LF while donning/doffing finger splint in order to reduce risk of skin breakdown. Baseline: Eval: Splint not yet issued/education not yet initiated Goal status: INITIAL  LONG TERM GOALS: Target date: 08/05/24  Pt will increase MAM-20 (for musculoskeletal conditions) score by 9 or more points in order to improve pt's perceived functional use of bilat hands with daily tasks. Baseline: Eval: MAM-20 to be given next session Goal status: INITIAL  2.  Pt will increase bilat grip strength by 10 or more lbs in each hand to ease ability to open containers. Baseline: Eval: R 35 lbs, L 28 lbs (below age range norms) Goal status: INITIAL  3.  Pt will increase lateral pinch strength by 3 lbs or more in each hand to ease ability to open food packages.  Baseline: Eval: R 13 lbs, L  11 lbs Goal status: INITIAL  4.  Pt will increase bilat hand flexibility to enable ability to formulate a fully closed fist to reduce frequency of dropping smaller ADL supplies in either hand. Baseline: Eval: (Tip to palm) R LF lacking 3cm, L IF lacking 2.5 cm, L RF lacking 1.5 cm Goal status: INITIAL  5.  Pt will follow recommended wearing schedule of oval 8 splint with at least 75% adherence to reduce radial deviation in R LF DIP joint and reduce  risk of worsening hand deformity in R dominant hand. Baseline: Eval: Splint not yet issued; Pt presents with 35* of radial deviation on R LF DIP joint (able to passively reduce to 25*) Goal status: INITIAL  6.  Pt will tolerate manual therapy, therapeutic modalities, and exercises to  decrease pain in bilat hands to a reported 3/10 pain or less with daily activities.   Baseline: Eval: 3-4/10 pain in bilat hands at rest, 7-8/10 pain with activity Goal status: INITIAL  7.  Pt will tolerate and implement edema management strategies to reduce edema in bilat hands to allow doffing/donning wedding ring. Baseline: Pt reports he's been unable to doff wedding ring for ~2 months, but previously donned/doffed ring daily; L RF PIP joint circumference 7.1 cm, R 6.9 cm Goal status: INITIAL    ASSESSMENT:  CLINICAL IMPRESSION: Pt reports having started a tylenol  regimen, limiting himself to 500 mg in the a.m. and 500 mg in the p.m. as needed after follow up from MD for pain management for bilat hands.  Pt prefers to limit medication when able, and will plan to continue use of heated mitts, compression, glove, massage, stretching, and activity modification in order to conservatively manage pain.  Pt with good tolerance to therapeutic exercises this date.  Will plan to reassess next visit for 10th visit progress update.  Pt will continue to benefit from skilled OT to address pain, stiffness, and weakness in bilat hands in order to improve indep and tolerance to ADL/IADL tasks.    PERFORMANCE DEFICITS: in functional skills including ADLs, IADLs, coordination, dexterity, edema, ROM, strength, pain, flexibility, Fine motor control, body mechanics, decreased knowledge of precautions, decreased knowledge of use of DME, and UE functional use, and psychosocial skills including coping strategies, environmental adaptation, habits, and routines and behaviors.   IMPAIRMENTS: are limiting patient from ADLs, IADLs, and leisure.   COMORBIDITIES: has co-morbidities such as HTN, Afib, hx of Nstemi, HLD that affects occupational performance. Patient will benefit from skilled OT to address above impairments and improve overall function.  MODIFICATION OR ASSISTANCE TO COMPLETE EVALUATION: No modification of  tasks or assist necessary to complete an evaluation.  OT OCCUPATIONAL PROFILE AND HISTORY: Detailed assessment: Review of records and additional review of physical, cognitive, psychosocial history related to current functional performance.  CLINICAL DECISION MAKING: Moderate - several treatment options, min-mod task modification necessary  REHAB POTENTIAL: Good  EVALUATION COMPLEXITY: Moderate    PLAN:  OT FREQUENCY: 2x/week  OT DURATION: 12 weeks  PLANNED INTERVENTIONS: 97168 OT Re-evaluation, 97535 self care/ADL training, 40981 therapeutic exercise, 97530 therapeutic activity, 97140 manual therapy, 97018 paraffin, 19147 moist heat, 97010 cryotherapy, 97034 contrast bath, 97760 Orthotic Initial, 97763 Orthotic/Prosthetic subsequent, passive range of motion, coping strategies training, patient/family education, and DME and/or AE instructions  RECOMMENDED OTHER SERVICES: None at this time  CONSULTED AND AGREED WITH PLAN OF CARE: Patient  PLAN FOR NEXT SESSION: see above  Marcus Sewer, MS, OTR/L  Casandra Claw, OT 06/12/2024, 8:16 PM

## 2024-06-12 NOTE — Therapy (Unsigned)
 OUTPATIENT OCCUPATIONAL THERAPY ORTHO TREATMENT NOTE  Patient Name: Gary Mueller MRN: [id number] DOB:August 04, 1941, 83 y.o., male Today's Date: 06/12/2024  PCP: Dr. Ophelia Sage ([facility]) REFERRING PROVIDER: Tobie Lady POUR, MD (Rheumatology)  END OF SESSION:     Past Medical History:  Diagnosis Date   Allergy    Anxiety    Arthritis    Cancer (HCC) 10/20/2016   Basal Cell Skin Cancer; lip, neck   Cystoid macular edema of left eye 07/07/2019   Depression    Dysrhythmia    A FIB   GERD (gastroesophageal reflux disease)    HOH (hard of hearing)    Bilateral   Hyperlipidemia    Hypertension    Patient denies   Hypothyroidism    IBS (irritable bowel syndrome)    Inguinal hernia    bilateral   Pneumonia    Thyroid  disease    Tinnitus of both ears    Past Surgical History:  Procedure Laterality Date   ANTERIOR VITRECTOMY Left 11/28/2018   Procedure: ANTERIOR VITRECTOMY;  Surgeon: Ferol Rogue, MD;  Location: [facility];  Service: Ophthalmology;  Laterality: Left;   CARDIAC CATHETERIZATION     CATARACT EXTRACTION W/PHACO Right 10/10/2018   Procedure: CATARACT EXTRACTION PHACO AND INTRAOCULAR LENS PLACEMENT (IOC);  Surgeon: Ferol Rogue, MD;  Location: [facility];  Service: Ophthalmology;  Laterality: Right;  US  00:41 CDE 6.87 Fluid pack lot # [id number]   CATARACT EXTRACTION W/PHACO Left 11/28/2018   Procedure: CATARACT EXTRACTION PHACO AND INTRAOCULAR LENS PLACEMENT (IOC)-LEFT;  Surgeon: Ferol Rogue, MD;  Location: [facility];  Service: Ophthalmology;  Laterality: Left;  US  00:47.7 CDE 8.92 Fluid Pack lot # [id number]   CHOLECYSTECTOMY  1968   CHONDROPLASTY Right 01/02/2018   Procedure: CHONDROPLASTY;  Surgeon: Mardee Lynwood SQUIBB, MD;  Location: [facility];  Service: Orthopedics;  Laterality: Right;   COLONOSCOPY  2014   CORONARY STENT INTERVENTION N/A 10/19/2021   Procedure: CORONARY STENT INTERVENTION;  Surgeon: Ammon Blunt, MD;  Location: [facility];  Service:  Cardiovascular;  Laterality: N/A;   EYE SURGERY     HERNIA REPAIR Left 04/18/1996   inguinal hernia repair/ Dr Dessa   HERNIA REPAIR Right 02/26/1996   Dr Dessa   HERNIA REPAIR Left 08/07/2002   Dr Dessa   JOINT REPLACEMENT     KNEE ARTHROPLASTY Right 04/30/2020   Procedure: COMPUTER ASSISTED TOTAL KNEE ARTHROPLASTY;  Surgeon: Mardee Lynwood SQUIBB, MD;  Location: [facility];  Service: Orthopedics;  Laterality: Right;   KNEE ARTHROSCOPY Right 01/02/2018   Procedure: ARTHROSCOPY KNEE;  Surgeon: Mardee Lynwood SQUIBB, MD;  Location: [facility];  Service: Orthopedics;  Laterality: Right;   KNEE ARTHROSCOPY Left 02/14/2012   partial menisectomy and chondroplasty   KNEE ARTHROSCOPY WITH MEDIAL MENISECTOMY Right 01/02/2018   Procedure: KNEE ARTHROSCOPY WITH MEDIAL MENISECTOMY;  Surgeon: Mardee Lynwood SQUIBB, MD;  Location: [facility];  Service: Orthopedics;  Laterality: Right;   LEFT HEART CATH AND CORONARY ANGIOGRAPHY N/A 10/19/2021   Procedure: LEFT HEART CATH AND CORONARY ANGIOGRAPHY;  Surgeon: Ammon Blunt, MD;  Location: [facility];  Service: Cardiovascular;  Laterality: N/A;   LEFT HEART CATH AND CORONARY ANGIOGRAPHY Left 10/10/2022   Procedure: LEFT HEART CATH AND CORONARY ANGIOGRAPHY;  Surgeon: Ammon Blunt, MD;  Location: [facility];  Service: Cardiovascular;  Laterality: Left;   TONSILLECTOMY     as a child   TOTAL KNEE ARTHROPLASTY Left 03/29/2015   [facility] Dr. Mardee   VASECTOMY  Patient Active Problem List   Diagnosis Date Noted   Encounter for fitting and adjustment of hearing aid 04/04/2022   Sensorineural hearing loss, bilateral 04/04/2022   Atrial fibrillation (HCC) 04/04/2022   Hypertension 04/04/2022   Gastroesophageal reflux disease without esophagitis    Rheumatoid arthritis (HCC)    NSTEMI (non-ST elevated myocardial infarction) (HCC) 10/18/2021   Total knee replacement status 04/30/2020   Primary osteoarthritis of right knee 03/28/2020   CME (cystoid  macular edema), left 07/07/2019   Nonexudative age-related macular degeneration, bilateral, intermediate dry stage 07/07/2019   SOB (shortness of breath) on exertion 08/22/2017   Chest pain with high risk for cardiac etiology 08/22/2017   Recurrent left inguinal hernia 01/04/2017   Allergic rhinitis 06/17/2015   CAD in native artery 06/17/2015   A-fib (HCC) 06/17/2015   Gonalgia 06/17/2015   Polypharmacy 06/17/2015   Cold sore 06/17/2015   Difficulty hearing 06/17/2015   HLD (hyperlipidemia) 06/17/2015   Essential hypertension 06/17/2015   Hypothyroidism 06/17/2015   Meniere's disease 06/17/2015   Buzzing in ear 06/17/2015   Beat, premature ventricular 06/17/2015   Status post total left knee replacement 04/13/2015   ONSET DATE: Beginning ~4-5 years ago, but worsening over the last 1-1.5 years  REFERRING DIAG: M19.041,M19.042 (ICD-10-CM) - Primary osteoarthritis of both hands   THERAPY DIAG:  No diagnosis found.  Rationale for Evaluation and Treatment: Rehabilitation  SUBJECTIVE:  SUBJECTIVE STATEMENT: Pt reports that he did reach out to his rheumatologist to discuss pain management options, but has not yet heard back. Pt accompanied by: self  PERTINENT HISTORY: Per medical record from rheumatologist appt on 05/05/24:  Subjective:HPI  Gary Mueller is a 83 y.o. male is here today for follow up of inflammation arthritis, osteoarthritis. The patient's allergies, current medications, past family history, past medical history, past social history, past surgical history and problem list were reviewed and updated as appropriate.   He is having pain of the left 2nd and 4th PIP joints. He is not able to form a fist. He has poor grip. He does take Tylenol  for pains.  Poor Grip DIP and PIP Enlargement, CMC Squaring  Left 2nd and 4th PIP Tenderness  Significant hypertrophic changes of the hand joints   Primary osteoarthritis of both hands - Ambulatory Referral to Occupational  Therapy  -- He has been diagnosed with seronegative inflammatory arthritis. I think this is more erosive osteoarthritis.  -- Continue to monitor of the methotrexate  -- Previous cortisone injection has helped the hand joints  -- Can take Tylenol  for pains as needed  -- Refer to OT for further evaluation  Return in about 6 months (around 11/05/2024) for Routine Follow Up.   PRECAUTIONS: Other: maintain joint protection strategies/cumulative trauma prevention  RED FLAGS: None   WEIGHT BEARING RESTRICTIONS: No  PAIN: 06/12/24: 5-6/10 in bilat hands Are you having pain? Yes: NPRS scale: 3-4 in bilat hands at rest, 7-8 with activity  Pain location: bilat hands, but increased tenderness to touch in the L 4th finger and thumb, R 5th digit (burning)   Pain description: sharp in both hands, burning in the R 5th digit and L thumb CMC  Aggravating factors: activity Relieving factors: elevating, rest, tylenol , heat   FALLS: Has patient fallen in last 6 months? No  LIVING ENVIRONMENT: Lives with: lives with their family, lives with adult son   PLOF: Retired from owning a Occupational [employer]; Currently plays golf a couple days per week (must use built up golf grips and  has modified his grip), exercises at the [facility] 3-4 times a week, walks dog 2-3x per week.    PATIENT GOALS: I'd like to be able to use them better, get more movement in my hands.   NEXT MD VISIT: Return to Dr. Tobie in ~6 months, per chart (Rheumatology)  OBJECTIVE:  Note: Objective measures were completed at Evaluation unless otherwise noted.  HAND DOMINANCE: Right  ADLs: Eating: indep; pt reports no difficulties with cutting food or manipulating eating utensils Grooming: indep Upper body dressing: difficulty managing small buttons on a dress shirt; extra time needed Lower body dressing: pt tends to buy pants without buttons d/t difficulty with fasteners  Toileting: indep Bathing: pt reports that he  frequently drops his soap d/t inability to fully close either hand Tub shower transfers: pt does have grab bars in shower, but reports he is still able to grasp bars without difficulty  Equipment: jar opener for kitchen  FUNCTIONAL OUTCOME MEASURES: MAM-20 for musculoskeletal conditions: TBD next session 05/15/24: 60/80  UPPER EXTREMITY ROM:     Active ROM Right eval  Left eval   Thumb MCP (0-60)      Thumb IP (0-80)      Thumb Radial abd/add (0-55)       Thumb Palmar abd/add (0-45)       Thumb Opposition to Small Finger       Index MCP (0-90) 80* flexion 85  78* flexion 80  Index PIP (0-100)       Index DIP (0-70)        Long MCP (0-90)        Long PIP (0-100) 20* flexion  60     Long DIP (0-70) 35* of radial deviation (passively -25* from neutral)  30* of radial deviation (passively -10* from neutral)     Ring MCP (0-90)        Ring PIP (0-100)        Ring DIP (0-70)        Little MCP (0-90)        Little PIP (0-100)        Little DIP (0-70)        (Blank rows = not tested)  Tip to palm: R LF lacking 3cm; 06/12/24: 1.5 cm from palm Tip to palm: L IF lacking 2.5 cm, L RF lacking 1.5 cm; 06/12/24:  L IF 2.0 cm  L RF 0      Able to oppose all digits to thumb on each hand, but thumb touches medial aspect (not tip) of 5th digit DIP on each hand   UPPER EXTREMITY MMT:     MMT Right eval Left eval  Shoulder flexion    Shoulder abduction    Shoulder adduction    Shoulder extension    Shoulder internal rotation    Shoulder external rotation    Middle trapezius    Lower trapezius    Elbow flexion    Elbow extension    Wrist flexion 5 5  Wrist extension 5 5  Wrist ulnar deviation    Wrist radial deviation    Wrist pronation    Wrist supination    (Blank rows = not tested)  HAND FUNCTION: Grip strength: Right: 35 lbs; Left: 28 lbs, Lateral pinch: Right: 13 lbs, Left: 11 lbs, and 3 point pinch: Right: 9 lbs, Left: 8 lbs 06/12/24: Grip Right: 51 lbs; Left: 46 lbs,  lateral pinch: Right: 14 lbs, Left: 11 lbs, and 3 point pinch:  Right: 12 lbs, Left: 12 lbs   COORDINATION: 9 Hole Peg test: Right: 30 sec; Left: 30 sec 06/12/24: Right: 28 sec    Left: 27 sec   SENSATION: WFL  EDEMA:    L RF PIP circumference: 7.1 cm (Pt unable to doff wedding ring from L RF for 2 months now d/t increased swelling in digits) R RF PIP circumference: 6.9 cm  COGNITION: Overall cognitive status: Within functional limits for tasks assessed  OBSERVATIONS:  Pt pleasant/cooperative, and appears eager to improve functional use of hands with trial of OT.  TREATMENT DATE: 06/05/24 Moist heat used intermittently throughout session to manage pain and promote muscle relaxation for improving digit ROM in bilat hands.  Manual Therapy: -Performed edema massage and soft tissue massage to MP, PIP, DIP joints in each digit of R/L hands, working to reduce pain and swelling to improve digit ROM for functional activities.  Therapeutic Exercise: -Gentle passive stretching to digits 1-5 in each hand to promote flex/ext at the MP, PIP, and DIP joints for improving grasp/release, increased focus on L IF and R LF. -Gentle passive stretching at the R LF DIP to reduce radial deviation. -B grip strengthening: Hand gripper set at moderate resistance with 2 red bands for 5 sets 10 reps, alternating hands with each set.  Constant monitoring for tolerance of resistance to avoid increased pain in hands. -Recommendation for prolonged digit flexion stretch within pt's heated mitts at home: Closed fists x3-5 min on bilat hands/to tolerance.                                                                                                                            PATIENT EDUCATION: Education details: HEP Person educated: Patient Education method: Explanation Education comprehension: verbalized understanding  HOME EXERCISE PROGRAM: Yellow theraband; edema massage to digits in either hand  GOALS: Goals  reviewed with patient? Yes  SHORT TERM GOALS: Target date: 06/24/24  Pt will be indep to perform HEP for improving bilat hand flexibility, strength, and coordination. Baseline: Eval: HEP not yet initiated Goal status: INITIAL  2.  Pt will be indep to verbalize 2-3 joint protection/cumulative trauma prevention strategies to reduce pain in R/L hands.  Baseline: Eval: Education not yet initiated Goal status: INITIAL  3.  Pt will be indep to verbalize and implement 1-2 pain management strategies to reduce pain in bilat hands.  Baseline: Eval: Education not yet initiated Goal status: INITIAL  4.  Pt will be indep to monitor skin integrity of R LF while donning/doffing finger splint in order to reduce risk of skin breakdown. Baseline: Eval: Splint not yet issued/education not yet initiated Goal status: INITIAL  LONG TERM GOALS: Target date: 08/05/24  Pt will increase MAM-20 (for musculoskeletal conditions) score by 9 or more points in order to improve pt's perceived functional use of bilat hands with daily tasks. Baseline: Eval: MAM-20 to be given next session Goal status: INITIAL  2.  Pt will increase bilat  grip strength by 10 or more lbs in each hand to ease ability to open containers. Baseline: Eval: R 35 lbs, L 28 lbs (below age range norms) Goal status: INITIAL  3.  Pt will increase lateral pinch strength by 3 lbs or more in each hand to ease ability to open food packages.  Baseline: Eval: R 13 lbs, L 11 lbs Goal status: INITIAL  4.  Pt will increase bilat hand flexibility to enable ability to formulate a fully closed fist to reduce frequency of dropping smaller ADL supplies in either hand. Baseline: Eval: (Tip to palm) R LF lacking 3cm, L IF lacking 2.5 cm, L RF lacking 1.5 cm Goal status: INITIAL  5.  Pt will follow recommended wearing schedule of oval 8 splint with at least 75% adherence to reduce radial deviation in R LF DIP joint and reduce  risk of worsening hand deformity  in R dominant hand. Baseline: Eval: Splint not yet issued; Pt presents with 35* of radial deviation on R LF DIP joint (able to passively reduce to 25*) Goal status: INITIAL  6.  Pt will tolerate manual therapy, therapeutic modalities, and exercises to decrease pain in bilat hands to a reported 3/10 pain or less with daily activities.   Baseline: Eval: 3-4/10 pain in bilat hands at rest, 7-8/10 pain with activity Goal status: INITIAL  7.  Pt will tolerate and implement edema management strategies to reduce edema in bilat hands to allow doffing/donning wedding ring. Baseline: Pt reports he's been unable to doff wedding ring for ~2 months, but previously donned/doffed ring daily; L RF PIP joint circumference 7.1 cm, R 6.9 cm; 06/12/24: 6.9 cm  Goal status: INITIAL    ASSESSMENT:  CLINICAL IMPRESSION: Pt reports no extreme pain yesterday in L RF as experienced earlier in the week.  Pt will wait to hear from rheumatologist re: pain management options.  Good tolerance to manual therapy with addition of Biofreeze to R/L hand digits.  Pt continues to tolerate gentle strengthening and passive stretching to bilat hands.  Pt verbalizes improving digit flexion on each hand.  Pt will continue to benefit from skilled OT to address pain, stiffness, and weakness in bilat hands in order to improve indep and tolerance to ADL/IADL tasks.    PERFORMANCE DEFICITS: in functional skills including ADLs, IADLs, coordination, dexterity, edema, ROM, strength, pain, flexibility, Fine motor control, body mechanics, decreased knowledge of precautions, decreased knowledge of use of DME, and UE functional use, and psychosocial skills including coping strategies, environmental adaptation, habits, and routines and behaviors.   IMPAIRMENTS: are limiting patient from ADLs, IADLs, and leisure.   COMORBIDITIES: has co-morbidities such as HTN, Afib, hx of Nstemi, HLD that affects occupational performance. Patient will benefit from  skilled OT to address above impairments and improve overall function.  MODIFICATION OR ASSISTANCE TO COMPLETE EVALUATION: No modification of tasks or assist necessary to complete an evaluation.  OT OCCUPATIONAL PROFILE AND HISTORY: Detailed assessment: Review of records and additional review of physical, cognitive, psychosocial history related to current functional performance.  CLINICAL DECISION MAKING: Moderate - several treatment options, min-mod task modification necessary  REHAB POTENTIAL: Good  EVALUATION COMPLEXITY: Moderate    PLAN:  OT FREQUENCY: 2x/week  OT DURATION: 12 weeks  PLANNED INTERVENTIONS: 97168 OT Re-evaluation, 97535 self care/ADL training, 02889 therapeutic exercise, 97530 therapeutic activity, 97140 manual therapy, 97018 paraffin, 02989 moist heat, 97010 cryotherapy, 97034 contrast bath, 97760 Orthotic Initial, 97763 Orthotic/Prosthetic subsequent, passive range of motion, coping strategies training, patient/family education, and  DME and/or AE instructions  RECOMMENDED OTHER SERVICES: None at this time  CONSULTED AND AGREED WITH PLAN OF CARE: Patient  PLAN FOR NEXT SESSION: see above  Inocente Blazing, MS, OTR/L  Inocente MARLA Blazing, OT 06/12/2024, 2:07 PM

## 2024-06-17 ENCOUNTER — Ambulatory Visit

## 2024-06-17 DIAGNOSIS — M6281 Muscle weakness (generalized): Secondary | ICD-10-CM

## 2024-06-17 DIAGNOSIS — R278 Other lack of coordination: Secondary | ICD-10-CM

## 2024-06-17 DIAGNOSIS — M19041 Primary osteoarthritis, right hand: Secondary | ICD-10-CM

## 2024-06-18 NOTE — Therapy (Incomplete)
 OUTPATIENT OCCUPATIONAL THERAPY ORTHO TREATMENT NOTE   Patient Name: [name] MRN: [id number] DOB:Nov 03, 1941, 83 y.o., male Today's Date: 06/18/2024  PCP: Dr. Ophelia Sage ([facility]) REFERRING PROVIDER: Tobie Lady POUR, MD (Rheumatology)  END OF SESSION:   OT End of Session - 06/18/24 2343     Visit Number 11    Number of Visits 24    Date for OT Re-Evaluation 08/05/24    OT Start Time 1445    OT Stop Time 1530    OT Time Calculation (min) 45 min    Activity Tolerance Patient tolerated treatment well    Behavior During Therapy [facility] for tasks assessed/performed          Past Medical History:  Diagnosis Date  . Allergy   . Anxiety   . Arthritis   . Cancer (HCC) 10/20/2016   Basal Cell Skin Cancer; lip, neck  . Cystoid macular edema of left eye 07/07/2019  . Depression   . Dysrhythmia    A FIB  . GERD (gastroesophageal reflux disease)   . HOH (hard of hearing)    Bilateral  . Hyperlipidemia   . Hypertension    Patient denies  . Hypothyroidism   . IBS (irritable bowel syndrome)   . Inguinal hernia    bilateral  . Pneumonia   . Thyroid  disease   . Tinnitus of both ears    Past Surgical History:  Procedure Laterality Date  . ANTERIOR VITRECTOMY Left 11/28/2018   Procedure: ANTERIOR VITRECTOMY;  Surgeon: Ferol Rogue, MD;  Location: [facility] ORS;  Service: Ophthalmology;  Laterality: Left;  . CARDIAC CATHETERIZATION    . CATARACT EXTRACTION W/PHACO Right 10/10/2018   Procedure: CATARACT EXTRACTION PHACO AND INTRAOCULAR LENS PLACEMENT (IOC);  Surgeon: Ferol Rogue, MD;  Location: [facility] OR[facility];  Service: Ophthalmology;  Laterality: Right;  US  00:41 CDE 6.87 Fluid pack lot # [id number]  . CATARACT EXTRACTION W/PHACO Left 11/28/2018   Procedure: CATARACT EXTRACTION PHACO AND INTRAOCULAR LENS PLACEMENT (IOC)-LEFT;  Surgeon: Ferol Rogue, MD;  Location: [facility] OR[facility];  Service: Ophthalmology;  Laterality: Left;  US  00:47.7 CDE 8.92 Fluid Pack lot # [id number]  . CHOLECYSTECTOMY   1968  . CHONDROPLASTY Right 01/02/2018   Procedure: CHONDROPLASTY;  Surgeon: Mardee Lynwood SQUIBB, MD;  Location: [facility] OR[facility];  Service: Orthopedics;  Laterality: Right;  . COLONOSCOPY  2014  . CORONARY STENT INTERVENTION N/A 10/19/2021   Procedure: CORONARY STENT INTERVENTION;  Surgeon: Ammon Blunt, MD;  Location: [facility];  Service: Cardiovascular;  Laterality: N/A;  . EYE SURGERY    . HERNIA REPAIR Left 04/18/1996   inguinal hernia repair/ Dr Dessa  . HERNIA REPAIR Right 02/26/1996   Dr Dessa  . HERNIA REPAIR Left 08/07/2002   Dr Dessa  . JOINT REPLACEMENT    . KNEE ARTHROPLASTY Right 04/30/2020   Procedure: COMPUTER ASSISTED TOTAL KNEE ARTHROPLASTY;  Surgeon: Mardee Lynwood SQUIBB, MD;  Location: [facility] OR[facility];  Service: Orthopedics;  Laterality: Right;  . KNEE ARTHROSCOPY Right 01/02/2018   Procedure: ARTHROSCOPY KNEE;  Surgeon: Mardee Lynwood SQUIBB, MD;  Location: [facility] OR[facility];  Service: Orthopedics;  Laterality: Right;  . KNEE ARTHROSCOPY Left 02/14/2012   partial menisectomy and chondroplasty  . KNEE ARTHROSCOPY WITH MEDIAL MENISECTOMY Right 01/02/2018   Procedure: KNEE ARTHROSCOPY WITH MEDIAL MENISECTOMY;  Surgeon: Mardee Lynwood SQUIBB, MD;  Location: [facility];  Service: Orthopedics;  Laterality: Right;  . LEFT HEART CATH AND CORONARY ANGIOGRAPHY N/A 10/19/2021   Procedure: LEFT HEART CATH AND CORONARY ANGIOGRAPHY;  Surgeon: Ammon,  Marsa, MD;  Location: [facility];  Service: Cardiovascular;  Laterality: N/A;  . LEFT HEART CATH AND CORONARY ANGIOGRAPHY Left 10/10/2022   Procedure: LEFT HEART CATH AND CORONARY ANGIOGRAPHY;  Surgeon: Ammon Marsa, MD;  Location: [facility];  Service: Cardiovascular;  Laterality: Left;  . TONSILLECTOMY     as a child  . TOTAL KNEE ARTHROPLASTY Left 03/29/2015   [facility] Dr. Mardee  . VASECTOMY     Patient Active Problem List   Diagnosis Date Noted  . Encounter for fitting and adjustment of hearing aid 04/04/2022  .  Sensorineural hearing loss, bilateral 04/04/2022  . Atrial fibrillation (HCC) 04/04/2022  . Hypertension 04/04/2022  . Gastroesophageal reflux disease without esophagitis   . Rheumatoid arthritis (HCC)   . NSTEMI (non-ST elevated myocardial infarction) (HCC) 10/18/2021  . Total knee replacement status 04/30/2020  . Primary osteoarthritis of right knee 03/28/2020  . CME (cystoid macular edema), left 07/07/2019  . Nonexudative age-related macular degeneration, bilateral, intermediate dry stage 07/07/2019  . SOB (shortness of breath) on exertion 08/22/2017  . Chest pain with high risk for cardiac etiology 08/22/2017  . Recurrent left inguinal hernia 01/04/2017  . Allergic rhinitis 06/17/2015  . CAD in native artery 06/17/2015  . A-fib (HCC) 06/17/2015  . Gonalgia 06/17/2015  . Polypharmacy 06/17/2015  . Cold sore 06/17/2015  . Difficulty hearing 06/17/2015  . HLD (hyperlipidemia) 06/17/2015  . Essential hypertension 06/17/2015  . Hypothyroidism 06/17/2015  . Meniere's disease 06/17/2015  . Buzzing in ear 06/17/2015  . Beat, premature ventricular 06/17/2015  . Status post total left knee replacement 04/13/2015   ONSET DATE: Beginning ~4-5 years ago, but worsening over the last 1-1.5 years  REFERRING DIAG: M19.041,M19.042 (ICD-10-CM) - Primary osteoarthritis of both hands   THERAPY DIAG:  Muscle weakness (generalized)  Other lack of coordination  Primary osteoarthritis of both hands  Rationale for Evaluation and Treatment: Rehabilitation  SUBJECTIVE:  SUBJECTIVE STATEMENT: Pt reports doing well today but did have an episode of severe pain in the L RF since last seen by OT, still seeming to come out of nowhere and then resolving the next day.   Pt accompanied by: self  PERTINENT HISTORY: Per medical record from rheumatologist appt on 05/05/24:  Subjective:HPI  Issaiah Seabrooks Mueller is a 83 y.o. male is here today for follow up of inflammation arthritis, osteoarthritis. The  patient's allergies, current medications, past family history, past medical history, past social history, past surgical history and problem list were reviewed and updated as appropriate.   He is having pain of the left 2nd and 4th PIP joints. He is not able to form a fist. He has poor grip. He does take Tylenol  for pains.  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  -- Continue to monitor of the methotrexate  -- Previous cortisone injection has helped the hand joints  -- Can take Tylenol  for pains as needed  -- Refer to OT for further evaluation  Return in about 6 months (around 11/05/2024) for Routine Follow Up.   PRECAUTIONS: Other: maintain joint protection strategies/cumulative trauma prevention  RED FLAGS: None   WEIGHT BEARING RESTRICTIONS: No  PAIN: 06/17/24: 3-4/10 in bilat hands Are you having pain? Yes: NPRS scale:  3-4 in bilat hands at rest, 7-8 with activity  Pain location: bilat hands, but increased tenderness to touch in the L 4th finger and thumb, R 5th digit (burning)   Pain description: sharp in both hands, burning in the R 5th digit and L thumb CMC  Aggravating factors: activity Relieving factors: elevating, rest, tylenol , heat   FALLS: Has patient fallen in last 6 months? No  LIVING ENVIRONMENT: Lives with: lives with their family, lives with adult son   PLOF: Retired from owning a Occupational psychologist; Currently plays golf a couple days per week (must use built up golf grips and has modified his grip), exercises at the [employer] at [facility] 3-4 times a week, walks dog 2-3x per week.    PATIENT GOALS: I'd like to be able to use them better, get more movement in my hands.    NEXT MD VISIT: Return to Dr. Tobie in ~6 months, per chart (Rheumatology)  OBJECTIVE:  Note: Objective measures were completed at Evaluation unless otherwise noted.  HAND DOMINANCE: Right  ADLs: Eating: indep; pt reports no difficulties with cutting food or manipulating eating utensils Grooming: indep Upper body dressing: difficulty managing small buttons on a dress shirt; extra time needed Lower body dressing: pt tends to buy pants without buttons d/t difficulty with fasteners  Toileting: indep Bathing: pt reports that he frequently drops his soap d/t inability to fully close either hand Tub shower transfers: pt does have grab bars in shower, but reports he is still able to grasp bars without difficulty  Equipment: jar opener for kitchen  FUNCTIONAL OUTCOME MEASURES: MAM-20 for musculoskeletal conditions: TBD next session 05/15/24: 60/80 06/12/24: 66/80  UPPER EXTREMITY ROM:     Active ROM Right eval Right 06/12/24 Left eval Left 06/12/24  Thumb MCP (0-60)      Thumb IP (0-80)      Thumb Radial abd/add (0-55)       Thumb Palmar abd/add (0-45)       Thumb Opposition to Small Finger       Index MCP (0-90) 80* flexion 85  78* flexion 80  Index PIP (0-100)       Index DIP (0-70)        Long MCP (0-90)        Long PIP (0-100) 20* flexion  60     Long DIP (0-70) 35* of radial deviation (passively -25* from neutral)  30* of radial deviation (passively -10* from neutral)     Ring MCP (0-90)        Ring PIP (0-100)        Ring DIP (0-70)        Little MCP (0-90)        Little PIP (0-100)        Little DIP (0-70)        (Blank rows = not tested)  Eval: Tip to palm: R LF lacking 3 cm; 06/12/24: R LF lacking 1.5 cm Eval: Tip to palm: L IF lacking 2.5 cm; 06/12/24: L IF lacking 2.0 cm Eval: Tip to palm: L RF lacking 1.5 cm; 06/12/24: L RF able to fully touch tip to palm      Eval: Able to oppose all digits to thumb on each hand, but thumb touches medial aspect (not tip) of 5th  digit DIP on each hand; 06/12/24: No change   UPPER EXTREMITY MMT:     MMT Right eval Left eval  Shoulder flexion    Shoulder abduction  Shoulder adduction    Shoulder extension    Shoulder internal rotation    Shoulder external rotation    Middle trapezius    Lower trapezius    Elbow flexion    Elbow extension    Wrist flexion 5 5  Wrist extension 5 5  Wrist ulnar deviation    Wrist radial deviation    Wrist pronation    Wrist supination    (Blank rows = not tested)  HAND FUNCTION: Grip strength: Right: 35 lbs; Left: 28 lbs, Lateral pinch: Right: 13 lbs, Left: 11 lbs, and 3 point pinch: Right: 9 lbs, Left: 8 lbs 06/12/24: Grip Right: 51 lbs; Left: 46 lbs, lateral pinch: Right: 14 lbs, Left: 11 lbs, and 3 point pinch: Right: 12 lbs, Left: 12 lbs   COORDINATION: 9 Hole Peg test: Right: 30 sec; Left: 30 sec 06/12/24: Right: 28 sec; Left: 27 sec   SENSATION: WFL  EDEMA:    L RF PIP circumference: 7.1 cm (Pt unable to doff wedding ring from L RF for 2 months now d/t increased swelling in digits); R RF PIP circumference: 6.9 cm 06/12/24: L RF PIP circumference: 6.9 cm  COGNITION: Overall cognitive status: Within functional limits for tasks assessed  OBSERVATIONS:  Pt pleasant/cooperative, and appears eager to improve functional use of hands with trial of OT.  TREATMENT DATE: 06/17/24 Moist heat used intermittently throughout session to manage pain and promote muscle relaxation for improving digit ROM in bilat hands.  Manual Therapy: -Performed edema massage and soft tissue massage to MP, PIP, DIP joints in each digit of R/L hands, working to reduce pain and swelling to improve digit ROM for functional activities.   Therapeutic Exercise: -Gentle passive stretching to digits 1-5 in each hand to promote isolated flex/ext at the MP, PIP, and DIP joints (joint blocking) for improving grasp/release; increased focus on L IF and R LF with gentle joint distraction during flexion  at each joint for increased range; performed to tolerance with constant observation for increases in pain.  Pt with good tolerance. -Reps of R/L active assisted composite fisting/digit ext for increasing range tip to palm for holding/storing ADL supplies in either hand. -Gentle passive stretching at the R LF DIP to reduce radial deviation; Ktape applied end of session for prolonged stretch                                                                                                                         PATIENT EDUCATION: Education details: pain management strategies Person educated: Patient Education method: Explanation Education comprehension: verbalized understanding  HOME EXERCISE PROGRAM: Yellow theraband; edema massage to digits in either hand  GOALS: Goals reviewed with patient? Yes  SHORT TERM GOALS: Target date: 06/24/24  Pt will be indep to perform HEP for improving bilat hand flexibility, strength, and coordination. Baseline: Eval: HEP not yet initiated; 06/12/24: Indep and consistent to perform HEP daily Goal status: achieved  2.  Pt will be indep to verbalize 2-3  joint protection/cumulative trauma prevention strategies to reduce pain in R/L hands.  Baseline: Eval: Education not yet initiated; 06/12/24: indep to implement strategies reviewed in previous sessions Goal status: achieved  3.  Pt will be indep to verbalize and implement 1-2 pain management strategies to reduce pain in bilat hands.  Baseline: Eval: Education not yet initiated; 06/12/24: Pt has started taking tylenol  prn/per recommendation from rheumatologist.  Pt uses heat regularly in the home and performs gentle stretching to reduce pain/stiffness in joints of hands.  Goal status: achieved  4.  Pt will be indep to monitor skin integrity of R LF while donning/doffing finger splint in order to reduce risk of skin breakdown. Baseline: Eval: Splint not yet issued/education not yet initiated; 06/12/24: using Ktape in  place of splint; pt has had no skin breakdown from regular use of Ktape and is indep to monitor for any adverse reactions Goal status: achieved  LONG TERM GOALS: Target date: 08/05/24  Pt will increase MAM-20 (for musculoskeletal conditions) score by 9 or more points in order to improve pt's perceived functional use of bilat hands with daily tasks. Baseline: Eval: MAM-20 to be given next session; 05/15/24: 60/80; 06/12/24: 66/80 Goal status: in progress  2.  Pt will increase bilat grip strength by 10 or more lbs in each hand to ease ability to open containers. Baseline: Eval: R 35 lbs, L 28 lbs (below age range norms); 06/12/24: R 51 lbs; Left: 46 lbs Goal status: achieved/exceeded  3.  Pt will increase lateral pinch strength by 3 lbs or more in each hand to ease ability to open food packages.  Baseline: Eval: R 13 lbs, L 11 lbs; 06/12/24: R 14 lbs, Left: 11 lbs Goal status: in progress  4.  Pt will increase bilat hand flexibility to enable ability to formulate a fully closed fist to reduce frequency of dropping smaller ADL supplies in either hand. Baseline: Eval: (Tip to palm) R LF lacking 3cm, L IF lacking 2.5 cm, L RF lacking 1.5 cm; 06/12/24: R LF lacking 1.5 cm, L IF lacking 2.0 cm, L RF able to full touch tip to palm Goal status: in progress  5.  Pt will follow recommended wearing schedule of Ktape with at least 75% adherence to reduce radial deviation in R LF DIP joint and reduce  risk of worsening hand deformity in R dominant hand. Baseline: Eval: Splint not yet issued; Pt presents with 35* of radial deviation on R LF DIP joint (able to passively reduce to 25*); 06/12/24: using Ktape regularly in place of oval 8; now 30* of radial deviation (passively -10* from neutral)  Goal status: in progress/revised (oval 8 not used; transitioned to Ktape reduce risk of skin breakdown from splint)  6.  Pt will tolerate manual therapy, therapeutic modalities, and exercises to decrease pain in bilat  hands to a reported 3/10 pain or less with daily activities.   Baseline: Eval: 3-4/10 pain in bilat hands at rest, 7-8/10 pain with activity; 06/12/24: pain can range from 0-6 at rest, and 7-8 with activity, but typically improves with heat, soft tissue massage, occasional use of compression gloves, and activity modification Goal status: in progress  7.  Pt will tolerate and implement edema management strategies to reduce edema in bilat hands to allow doffing/donning wedding ring. Baseline: Pt reports he's been unable to doff wedding ring for ~2 months, but previously donned/doffed ring daily; L RF PIP joint circumference 7.1 cm, R 6.9 cm; 06/12/24: L RF PIP: 6.9 cm; pt  is able to don/doff wedding ring and does plan to get ring re-sized for improved comfort/fit of ring on this finger Goal status: achieved    ASSESSMENT:  CLINICAL IMPRESSION: Pt continues to tolerate manual therapy and passive stretching well this date, noting improvements with composite fisting in bilat hands.  Pt remains diligent with HEP, using putty typically 2x daily managing mild-moderate pain with moist heat, Biofreeze, gentle stretching, and occasional Tylenol .  Pt continues to have intermittent episodes of severe pain in the L RF, still seeming to come out of nowhere and then resolving the next day.  Pt reports this does not occur following exercises, or playing golf, or any other activity involving gripping/stretching/etc, and pt reports pain can reach extreme levels to the point of a bed sheet or a light touch can be excruciating.  Ktape applied end of session, anchoring first end to radial side of base of LF, extending overtop fingertip with ~75% stretch, anchoring second end to ulnar side of base of LF to reduce radial deviation.  Pt continues to tolerate this well without pain.  Pt will continue to benefit from skilled OT to address pain, stiffness, and weakness in bilat hands in order to continue to improve indep and  tolerance to ADL/IADL tasks.    PERFORMANCE DEFICITS: in functional skills including ADLs, IADLs, coordination, dexterity, edema, ROM, strength, pain, flexibility, Fine motor control, body mechanics, decreased knowledge of precautions, decreased knowledge of use of DME, and UE functional use, and psychosocial skills including coping strategies, environmental adaptation, habits, and routines and behaviors.   IMPAIRMENTS: are limiting patient from ADLs, IADLs, and leisure.   COMORBIDITIES: has co-morbidities such as HTN, Afib, hx of Nstemi, HLD that affects occupational performance. Patient will benefit from skilled OT to address above impairments and improve overall function.  MODIFICATION OR ASSISTANCE TO COMPLETE EVALUATION: No modification of tasks or assist necessary to complete an evaluation.  OT OCCUPATIONAL PROFILE AND HISTORY: Detailed assessment: Review of records and additional review of physical, cognitive, psychosocial history related to current functional performance.  CLINICAL DECISION MAKING: Moderate - several treatment options, min-mod task modification necessary  REHAB POTENTIAL: Good  EVALUATION COMPLEXITY: Moderate    PLAN:  OT FREQUENCY: 2x/week  OT DURATION: 12 weeks  PLANNED INTERVENTIONS: 97168 OT Re-evaluation, 97535 self care/ADL training, 02889 therapeutic exercise, 97530 therapeutic activity, 97140 manual therapy, 97018 paraffin, 02989 moist heat, 97010 cryotherapy, 97034 contrast bath, 97760 Orthotic Initial, 97763 Orthotic/Prosthetic subsequent, passive range of motion, coping strategies training, patient/family education, and DME and/or AE instructions  RECOMMENDED OTHER SERVICES: None at this time  CONSULTED AND AGREED WITH PLAN OF CARE: Patient  PLAN FOR NEXT SESSION: see above  Gary Blazing, MS, OTR/L  Gary MARLA Mueller, OT 06/18/2024, 11:44 PM

## 2024-06-18 NOTE — Therapy (Signed)
 OUTPATIENT OCCUPATIONAL THERAPY ORTHO TREATMENT NOTE   Patient Name: Gary Mueller MRN: [id number] DOB:1941/02/12, 83 y.o., male Today's Date: 06/18/2024  PCP: Dr. Ophelia Sage ([facility]) REFERRING PROVIDER: Tobie Lady POUR, MD (Rheumatology)  END OF SESSION:   OT End of Session - 06/18/24 2343     Visit Number 11    Number of Visits 24    Date for OT Re-Evaluation 08/05/24    OT Start Time 1445    OT Stop Time 1530    OT Time Calculation (min) 45 min    Activity Tolerance Patient tolerated treatment well    Behavior During Therapy [facility] for tasks assessed/performed          Past Medical History:  Diagnosis Date   Allergy    Anxiety    Arthritis    Cancer (HCC) 10/20/2016   Basal Cell Skin Cancer; lip, neck   Cystoid macular edema of left eye 07/07/2019   Depression    Dysrhythmia    A FIB   GERD (gastroesophageal reflux disease)    HOH (hard of hearing)    Bilateral   Hyperlipidemia    Hypertension    Patient denies   Hypothyroidism    IBS (irritable bowel syndrome)    Inguinal hernia    bilateral   Pneumonia    Thyroid  disease    Tinnitus of both ears    Past Surgical History:  Procedure Laterality Date   ANTERIOR VITRECTOMY Left 11/28/2018   Procedure: ANTERIOR VITRECTOMY;  Surgeon: Ferol Rogue, MD;  Location: [facility] ORS;  Service: Ophthalmology;  Laterality: Left;   CARDIAC CATHETERIZATION     CATARACT EXTRACTION W/PHACO Right 10/10/2018   Procedure: CATARACT EXTRACTION PHACO AND INTRAOCULAR LENS PLACEMENT (IOC);  Surgeon: Ferol Rogue, MD;  Location: [facility] OR[facility];  Service: Ophthalmology;  Laterality: Right;  US  00:41 CDE 6.87 Fluid pack lot # [id number]   CATARACT EXTRACTION W/PHACO Left 11/28/2018   Procedure: CATARACT EXTRACTION PHACO AND INTRAOCULAR LENS PLACEMENT (IOC)-LEFT;  Surgeon: Ferol Rogue, MD;  Location: [facility] OR[facility];  Service: Ophthalmology;  Laterality: Left;  US  00:47.7 CDE 8.92 Fluid Pack lot # [id number]   CHOLECYSTECTOMY  1968   CHONDROPLASTY  Right 01/02/2018   Procedure: CHONDROPLASTY;  Surgeon: Mardee Lynwood SQUIBB, MD;  Location: [facility] OR[facility];  Service: Orthopedics;  Laterality: Right;   COLONOSCOPY  2014   CORONARY STENT INTERVENTION N/A 10/19/2021   Procedure: CORONARY STENT INTERVENTION;  Surgeon: Ammon Blunt, MD;  Location: [facility];  Service: Cardiovascular;  Laterality: N/A;   EYE SURGERY     HERNIA REPAIR Left 04/18/1996   inguinal hernia repair/ Dr Dessa   HERNIA REPAIR Right 02/26/1996   Dr Dessa   HERNIA REPAIR Left 08/07/2002   Dr Dessa   JOINT REPLACEMENT     KNEE ARTHROPLASTY Right 04/30/2020   Procedure: COMPUTER ASSISTED TOTAL KNEE ARTHROPLASTY;  Surgeon: Mardee Lynwood SQUIBB, MD;  Location: [facility];  Service: Orthopedics;  Laterality: Right;   KNEE ARTHROSCOPY Right 01/02/2018   Procedure: ARTHROSCOPY KNEE;  Surgeon: Mardee Lynwood SQUIBB, MD;  Location: [facility];  Service: Orthopedics;  Laterality: Right;   KNEE ARTHROSCOPY Left 02/14/2012   partial menisectomy and chondroplasty   KNEE ARTHROSCOPY WITH MEDIAL MENISECTOMY Right 01/02/2018   Procedure: KNEE ARTHROSCOPY WITH MEDIAL MENISECTOMY;  Surgeon: Mardee Lynwood SQUIBB, MD;  Location: [facility];  Service: Orthopedics;  Laterality: Right;   LEFT HEART CATH AND CORONARY ANGIOGRAPHY N/A 10/19/2021   Procedure: LEFT HEART CATH AND CORONARY ANGIOGRAPHY;  Surgeon: Ammon,  Marsa, MD;  Location: [facility];  Service: Cardiovascular;  Laterality: N/A;   LEFT HEART CATH AND CORONARY ANGIOGRAPHY Left 10/10/2022   Procedure: LEFT HEART CATH AND CORONARY ANGIOGRAPHY;  Surgeon: Ammon Marsa, MD;  Location: [facility];  Service: Cardiovascular;  Laterality: Left;   TONSILLECTOMY     as a child   TOTAL KNEE ARTHROPLASTY Left 03/29/2015   [facility] Dr. Mardee   VASECTOMY     Patient Active Problem List   Diagnosis Date Noted   Encounter for fitting and adjustment of hearing aid 04/04/2022   Sensorineural hearing loss, bilateral 04/04/2022    Atrial fibrillation (HCC) 04/04/2022   Hypertension 04/04/2022   Gastroesophageal reflux disease without esophagitis    Rheumatoid arthritis (HCC)    NSTEMI (non-ST elevated myocardial infarction) (HCC) 10/18/2021   Total knee replacement status 04/30/2020   Primary osteoarthritis of right knee 03/28/2020   CME (cystoid macular edema), left 07/07/2019   Nonexudative age-related macular degeneration, bilateral, intermediate dry stage 07/07/2019   SOB (shortness of breath) on exertion 08/22/2017   Chest pain with high risk for cardiac etiology 08/22/2017   Recurrent left inguinal hernia 01/04/2017   Allergic rhinitis 06/17/2015   CAD in native artery 06/17/2015   A-fib (HCC) 06/17/2015   Gonalgia 06/17/2015   Polypharmacy 06/17/2015   Cold sore 06/17/2015   Difficulty hearing 06/17/2015   HLD (hyperlipidemia) 06/17/2015   Essential hypertension 06/17/2015   Hypothyroidism 06/17/2015   Meniere's disease 06/17/2015   Buzzing in ear 06/17/2015   Beat, premature ventricular 06/17/2015   Status post total left knee replacement 04/13/2015   ONSET DATE: Beginning ~4-5 years ago, but worsening over the last 1-1.5 years  REFERRING DIAG: M19.041,M19.042 (ICD-10-CM) - Primary osteoarthritis of both hands   THERAPY DIAG:  Muscle weakness (generalized)  Other lack of coordination  Primary osteoarthritis of both hands  Rationale for Evaluation and Treatment: Rehabilitation  SUBJECTIVE:  SUBJECTIVE STATEMENT: Pt reports doing well today but did have an episode of severe pain in the L RF since last seen by OT, still seeming to come out of nowhere and then resolving the next day.   Pt accompanied by: self  PERTINENT HISTORY: Per medical record from rheumatologist appt on 05/05/24:  Subjective:HPI  Gary Mueller is a 83 y.o. male is here today for follow up of inflammation arthritis, osteoarthritis. The patient's allergies, current medications, past family history, past medical history,  past social history, past surgical history and problem list were reviewed and updated as appropriate.   He is having pain of the left 2nd and 4th PIP joints. He is not able to form a fist. He has poor grip. He does take Tylenol  for pains.  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  -- Continue to monitor of the methotrexate  -- Previous cortisone injection has helped the hand joints  -- Can take Tylenol  for pains as needed  -- Refer to OT for further evaluation  Return in about 6 months (around 11/05/2024) for Routine Follow Up.   PRECAUTIONS: Other: maintain joint protection strategies/cumulative trauma prevention  RED FLAGS: None   WEIGHT BEARING RESTRICTIONS: No  PAIN: 06/17/24: 3-4/10 in bilat hands Are you having pain? Yes: NPRS scale:  3-4 in bilat hands at rest, 7-8 with activity  Pain location: bilat hands, but increased tenderness to touch in the L 4th finger and thumb, R 5th digit (burning)   Pain description: sharp in both hands, burning in the R 5th digit and L thumb CMC  Aggravating factors: activity Relieving factors: elevating, rest, tylenol , heat   FALLS: Has patient fallen in last 6 months? No  LIVING ENVIRONMENT: Lives with: lives with their family, lives with adult son   PLOF: Retired from owning a Occupational psychologist; Currently plays golf a couple days per week (must use built up golf grips and has modified his grip), exercises at the [employer] at [facility] 3-4 times a week, walks dog 2-3x per week.    PATIENT GOALS: I'd like to be able to use them better, get more movement in my hands.   NEXT MD VISIT: Return to Dr. Tobie in ~6 months, per chart (Rheumatology)  OBJECTIVE:   Note: Objective measures were completed at Evaluation unless otherwise noted.  HAND DOMINANCE: Right  ADLs: Eating: indep; pt reports no difficulties with cutting food or manipulating eating utensils Grooming: indep Upper body dressing: difficulty managing small buttons on a dress shirt; extra time needed Lower body dressing: pt tends to buy pants without buttons d/t difficulty with fasteners  Toileting: indep Bathing: pt reports that he frequently drops his soap d/t inability to fully close either hand Tub shower transfers: pt does have grab bars in shower, but reports he is still able to grasp bars without difficulty  Equipment: jar opener for kitchen  FUNCTIONAL OUTCOME MEASURES: MAM-20 for musculoskeletal conditions: TBD next session 05/15/24: 60/80 06/12/24: 66/80  UPPER EXTREMITY ROM:     Active ROM Right eval Right 06/12/24 Left eval Left 06/12/24  Thumb MCP (0-60)      Thumb IP (0-80)      Thumb Radial abd/add (0-55)       Thumb Palmar abd/add (0-45)       Thumb Opposition to Small Finger       Index MCP (0-90) 80* flexion 85  78* flexion 80  Index PIP (0-100)       Index DIP (0-70)        Long MCP (0-90)        Long PIP (0-100) 20* flexion  60     Long DIP (0-70) 35* of radial deviation (passively -25* from neutral)  30* of radial deviation (passively -10* from neutral)     Ring MCP (0-90)        Ring PIP (0-100)        Ring DIP (0-70)        Little MCP (0-90)        Little PIP (0-100)        Little DIP (0-70)        (Blank rows = not tested)  Eval: Tip to palm: R LF lacking 3 cm; 06/12/24: R LF lacking 1.5 cm Eval: Tip to palm: L IF lacking 2.5 cm; 06/12/24: L IF lacking 2.0 cm Eval: Tip to palm: L RF lacking 1.5 cm; 06/12/24: L RF able to fully touch tip to palm      Eval: Able to oppose all digits to thumb on each hand, but thumb touches medial aspect (not tip) of 5th digit DIP on each hand; 06/12/24: No change   UPPER EXTREMITY MMT:     MMT Right eval  Left eval  Shoulder flexion    Shoulder abduction  Shoulder adduction    Shoulder extension    Shoulder internal rotation    Shoulder external rotation    Middle trapezius    Lower trapezius    Elbow flexion    Elbow extension    Wrist flexion 5 5  Wrist extension 5 5  Wrist ulnar deviation    Wrist radial deviation    Wrist pronation    Wrist supination    (Blank rows = not tested)  HAND FUNCTION: Grip strength: Right: 35 lbs; Left: 28 lbs, Lateral pinch: Right: 13 lbs, Left: 11 lbs, and 3 point pinch: Right: 9 lbs, Left: 8 lbs 06/12/24: Grip Right: 51 lbs; Left: 46 lbs, lateral pinch: Right: 14 lbs, Left: 11 lbs, and 3 point pinch: Right: 12 lbs, Left: 12 lbs   COORDINATION: 9 Hole Peg test: Right: 30 sec; Left: 30 sec 06/12/24: Right: 28 sec; Left: 27 sec   SENSATION: WFL  EDEMA:    L RF PIP circumference: 7.1 cm (Pt unable to doff wedding ring from L RF for 2 months now d/t increased swelling in digits); R RF PIP circumference: 6.9 cm 06/12/24: L RF PIP circumference: 6.9 cm  COGNITION: Overall cognitive status: Within functional limits for tasks assessed  OBSERVATIONS:  Pt pleasant/cooperative, and appears eager to improve functional use of hands with trial of OT.  TREATMENT DATE: 06/17/24 Moist heat used intermittently throughout session to manage pain and promote muscle relaxation for improving digit ROM in bilat hands.  Manual Therapy: -Performed edema massage and soft tissue massage to MP, PIP, DIP joints in each digit of R/L hands, working to reduce pain and swelling to improve digit ROM for functional activities.   Therapeutic Exercise: -Gentle passive stretching to digits 1-5 in each hand to promote isolated flex/ext at the MP, PIP, and DIP joints (joint blocking) for improving grasp/release; increased focus on L IF and R LF with gentle joint distraction during flexion at each joint for increased range; performed to tolerance with constant observation for  increases in pain.  Pt with good tolerance. -Reps of R/L active assisted composite fisting/digit ext for increasing range tip to palm for holding/storing ADL supplies in either hand. -Gentle passive stretching at the R LF DIP to reduce radial deviation; Ktape applied end of session for prolonged stretch   Ktape: anchoring first end to radial side of base of LF, extending overtop fingertip with ~75% stretch, anchoring second end to ulnar side of base of LF to reduce radial deviation.                                                                                                              PATIENT EDUCATION: Education details: pain management strategies Person educated: Patient Education method: Explanation Education comprehension: verbalized understanding  HOME EXERCISE PROGRAM: Yellow theraband; edema massage to digits in either hand  GOALS: Goals reviewed with patient? Yes  SHORT TERM GOALS: Target date: 06/24/24  Pt will be indep to perform HEP for improving bilat hand flexibility, strength, and coordination. Baseline: Eval: HEP not yet  initiated; 06/12/24: Indep and consistent to perform HEP daily Goal status: achieved  2.  Pt will be indep to verbalize 2-3 joint protection/cumulative trauma prevention strategies to reduce pain in R/L hands.  Baseline: Eval: Education not yet initiated; 06/12/24: indep to implement strategies reviewed in previous sessions Goal status: achieved  3.  Pt will be indep to verbalize and implement 1-2 pain management strategies to reduce pain in bilat hands.  Baseline: Eval: Education not yet initiated; 06/12/24: Pt has started taking tylenol  prn/per recommendation from rheumatologist.  Pt uses heat regularly in the home and performs gentle stretching to reduce pain/stiffness in joints of hands.  Goal status: achieved  4.  Pt will be indep to monitor skin integrity of R LF while donning/doffing finger splint in order to reduce risk of skin  breakdown. Baseline: Eval: Splint not yet issued/education not yet initiated; 06/12/24: using Ktape in place of splint; pt has had no skin breakdown from regular use of Ktape and is indep to monitor for any adverse reactions Goal status: achieved  LONG TERM GOALS: Target date: 08/05/24  Pt will increase MAM-20 (for musculoskeletal conditions) score by 9 or more points in order to improve pt's perceived functional use of bilat hands with daily tasks. Baseline: Eval: MAM-20 to be given next session; 05/15/24: 60/80; 06/12/24: 66/80 Goal status: in progress  2.  Pt will increase bilat grip strength by 10 or more lbs in each hand to ease ability to open containers. Baseline: Eval: R 35 lbs, L 28 lbs (below age range norms); 06/12/24: R 51 lbs; Left: 46 lbs Goal status: achieved/exceeded  3.  Pt will increase lateral pinch strength by 3 lbs or more in each hand to ease ability to open food packages.  Baseline: Eval: R 13 lbs, L 11 lbs; 06/12/24: R 14 lbs, Left: 11 lbs Goal status: in progress  4.  Pt will increase bilat hand flexibility to enable ability to formulate a fully closed fist to reduce frequency of dropping smaller ADL supplies in either hand. Baseline: Eval: (Tip to palm) R LF lacking 3cm, L IF lacking 2.5 cm, L RF lacking 1.5 cm; 06/12/24: R LF lacking 1.5 cm, L IF lacking 2.0 cm, L RF able to full touch tip to palm Goal status: in progress  5.  Pt will follow recommended wearing schedule of Ktape with at least 75% adherence to reduce radial deviation in R LF DIP joint and reduce  risk of worsening hand deformity in R dominant hand. Baseline: Eval: Splint not yet issued; Pt presents with 35* of radial deviation on R LF DIP joint (able to passively reduce to 25*); 06/12/24: using Ktape regularly in place of oval 8; now 30* of radial deviation (passively -10* from neutral)  Goal status: in progress/revised (oval 8 not used; transitioned to Ktape reduce risk of skin breakdown from  splint)  6.  Pt will tolerate manual therapy, therapeutic modalities, and exercises to decrease pain in bilat hands to a reported 3/10 pain or less with daily activities.   Baseline: Eval: 3-4/10 pain in bilat hands at rest, 7-8/10 pain with activity; 06/12/24: pain can range from 0-6 at rest, and 7-8 with activity, but typically improves with heat, soft tissue massage, occasional use of compression gloves, and activity modification Goal status: in progress  7.  Pt will tolerate and implement edema management strategies to reduce edema in bilat hands to allow doffing/donning wedding ring. Baseline: Pt reports he's been unable to doff wedding ring for ~2 months,  but previously donned/doffed ring daily; L RF PIP joint circumference 7.1 cm, R 6.9 cm; 06/12/24: L RF PIP: 6.9 cm; pt is able to don/doff wedding ring and does plan to get ring re-sized for improved comfort/fit of ring on this finger Goal status: achieved    ASSESSMENT:  CLINICAL IMPRESSION: Pt continues to tolerate manual therapy and passive stretching well this date, noting improvements with composite fisting in bilat hands.  Though improving, L IF and R LF not yet able to touch tip to palm to fully formulate closed fist.  Pt remains diligent with HEP, using putty typically 2x daily, and is managing mild-moderate pain with moist heat, Biofreeze, gentle stretching, and occasional Tylenol .  Pt continues to have intermittent episodes of severe pain in the L RF, still seeming to come out of nowhere and then resolving the next day.  Pt reports this does not occur following exercises, or playing golf, or any other activity involving gripping/stretching/etc, and pt reports pain can reach extreme levels to the point of a bed sheet or light touch to be excruciating.  However, these severe pain levels typically reside by the next day or within hours.  Pt has follow up with rheumatology in Aug, and has since been recommended by his MD to take Tylenol .   Ktape applied end of session to R LF as noted above for reducing radial deviation at the DIP joint.  Pt consistently tolerating ~75% stretch with Ktape to this joint.  Pt will continue to benefit from skilled OT to address pain, stiffness, and weakness in bilat hands in order to continue to improve indep and tolerance to ADL/IADL tasks.    PERFORMANCE DEFICITS: in functional skills including ADLs, IADLs, coordination, dexterity, edema, ROM, strength, pain, flexibility, Fine motor control, body mechanics, decreased knowledge of precautions, decreased knowledge of use of DME, and UE functional use, and psychosocial skills including coping strategies, environmental adaptation, habits, and routines and behaviors.   IMPAIRMENTS: are limiting patient from ADLs, IADLs, and leisure.   COMORBIDITIES: has co-morbidities such as HTN, Afib, hx of Nstemi, HLD that affects occupational performance. Patient will benefit from skilled OT to address above impairments and improve overall function.  MODIFICATION OR ASSISTANCE TO COMPLETE EVALUATION: No modification of tasks or assist necessary to complete an evaluation.  OT OCCUPATIONAL PROFILE AND HISTORY: Detailed assessment: Review of records and additional review of physical, cognitive, psychosocial history related to current functional performance.  CLINICAL DECISION MAKING: Moderate - several treatment options, min-mod task modification necessary  REHAB POTENTIAL: Good  EVALUATION COMPLEXITY: Moderate    PLAN:  OT FREQUENCY: 2x/week  OT DURATION: 12 weeks  PLANNED INTERVENTIONS: 97168 OT Re-evaluation, 97535 self care/ADL training, 02889 therapeutic exercise, 97530 therapeutic activity, 97140 manual therapy, 97018 paraffin, 02989 moist heat, 97010 cryotherapy, 97034 contrast bath, 97760 Orthotic Initial, 97763 Orthotic/Prosthetic subsequent, passive range of motion, coping strategies training, patient/family education, and DME and/or AE  instructions  RECOMMENDED OTHER SERVICES: None at this time  CONSULTED AND AGREED WITH PLAN OF CARE: Patient  PLAN FOR NEXT SESSION: see above  Inocente Blazing, MS, OTR/L  Inocente MARLA Blazing, OT 06/18/2024, 11:44 PM

## 2024-06-19 ENCOUNTER — Ambulatory Visit

## 2024-06-19 DIAGNOSIS — M6281 Muscle weakness (generalized): Secondary | ICD-10-CM | POA: Diagnosis not present

## 2024-06-19 DIAGNOSIS — M19042 Primary osteoarthritis, left hand: Secondary | ICD-10-CM

## 2024-06-19 DIAGNOSIS — R278 Other lack of coordination: Secondary | ICD-10-CM

## 2024-06-19 NOTE — Therapy (Signed)
 OUTPATIENT OCCUPATIONAL THERAPY ORTHO TREATMENT NOTE   Patient Name: Gary Mueller MRN: [id number] DOB:1941-07-04, 83 y.o., male Today's Date: 06/19/2024  PCP: Dr. Ophelia Sage ([facility]) REFERRING PROVIDER: Tobie Lady POUR, MD (Rheumatology)  END OF SESSION:   OT End of Session - 06/19/24 1343     Visit Number 12    Number of Visits 24    Date for OT Re-Evaluation 08/05/24    OT Start Time 1345    OT Stop Time 1430    OT Time Calculation (min) 45 min    Activity Tolerance Patient tolerated treatment well    Behavior During Therapy [facility] for tasks assessed/performed          Past Medical History:  Diagnosis Date   Allergy    Anxiety    Arthritis    Cancer (HCC) 10/20/2016   Basal Cell Skin Cancer; lip, neck   Cystoid macular edema of left eye 07/07/2019   Depression    Dysrhythmia    A FIB   GERD (gastroesophageal reflux disease)    HOH (hard of hearing)    Bilateral   Hyperlipidemia    Hypertension    Patient denies   Hypothyroidism    IBS (irritable bowel syndrome)    Inguinal hernia    bilateral   Pneumonia    Thyroid  disease    Tinnitus of both ears    Past Surgical History:  Procedure Laterality Date   ANTERIOR VITRECTOMY Left 11/28/2018   Procedure: ANTERIOR VITRECTOMY;  Surgeon: Ferol Rogue, MD;  Location: [facility] OR[facility];  Service: Ophthalmology;  Laterality: Left;   CARDIAC CATHETERIZATION     CATARACT EXTRACTION W/PHACO Right 10/10/2018   Procedure: CATARACT EXTRACTION PHACO AND INTRAOCULAR LENS PLACEMENT (IOC);  Surgeon: Ferol Rogue, MD;  Location: [facility];  Service: Ophthalmology;  Laterality: Right;  US  00:41 CDE 6.87 Fluid pack lot # [id number]   CATARACT EXTRACTION W/PHACO Left 11/28/2018   Procedure: CATARACT EXTRACTION PHACO AND INTRAOCULAR LENS PLACEMENT (IOC)-LEFT;  Surgeon: Ferol Rogue, MD;  Location: [facility];  Service: Ophthalmology;  Laterality: Left;  US  00:47.7 CDE 8.92 Fluid Pack lot # [id number]   CHOLECYSTECTOMY  1968   CHONDROPLASTY  Right 01/02/2018   Procedure: CHONDROPLASTY;  Surgeon: Mardee Lynwood SQUIBB, MD;  Location: [facility];  Service: Orthopedics;  Laterality: Right;   COLONOSCOPY  2014   CORONARY STENT INTERVENTION N/A 10/19/2021   Procedure: CORONARY STENT INTERVENTION;  Surgeon: Ammon Blunt, MD;  Location: [facility];  Service: Cardiovascular;  Laterality: N/A;   EYE SURGERY     HERNIA REPAIR Left 04/18/1996   inguinal hernia repair/ Dr Dessa   HERNIA REPAIR Right 02/26/1996   Dr Dessa   HERNIA REPAIR Left 08/07/2002   Dr Dessa   JOINT REPLACEMENT     KNEE ARTHROPLASTY Right 04/30/2020   Procedure: COMPUTER ASSISTED TOTAL KNEE ARTHROPLASTY;  Surgeon: Mardee Lynwood SQUIBB, MD;  Location: [facility];  Service: Orthopedics;  Laterality: Right;   KNEE ARTHROSCOPY Right 01/02/2018   Procedure: ARTHROSCOPY KNEE;  Surgeon: Mardee Lynwood SQUIBB, MD;  Location: [facility];  Service: Orthopedics;  Laterality: Right;   KNEE ARTHROSCOPY Left 02/14/2012   partial menisectomy and chondroplasty   KNEE ARTHROSCOPY WITH MEDIAL MENISECTOMY Right 01/02/2018   Procedure: KNEE ARTHROSCOPY WITH MEDIAL MENISECTOMY;  Surgeon: Mardee Lynwood SQUIBB, MD;  Location: [facility];  Service: Orthopedics;  Laterality: Right;   LEFT HEART CATH AND CORONARY ANGIOGRAPHY N/A 10/19/2021   Procedure: LEFT HEART CATH AND CORONARY ANGIOGRAPHY;  Surgeon: Ammon,  Marsa, MD;  Location: [facility];  Service: Cardiovascular;  Laterality: N/A;   LEFT HEART CATH AND CORONARY ANGIOGRAPHY Left 10/10/2022   Procedure: LEFT HEART CATH AND CORONARY ANGIOGRAPHY;  Surgeon: Ammon Marsa, MD;  Location: [facility];  Service: Cardiovascular;  Laterality: Left;   TONSILLECTOMY     as a child   TOTAL KNEE ARTHROPLASTY Left 03/29/2015   [facility] Dr. Mardee   VASECTOMY     Patient Active Problem List   Diagnosis Date Noted   Encounter for fitting and adjustment of hearing aid 04/04/2022   Sensorineural hearing loss, bilateral 04/04/2022    Atrial fibrillation (HCC) 04/04/2022   Hypertension 04/04/2022   Gastroesophageal reflux disease without esophagitis    Rheumatoid arthritis (HCC)    NSTEMI (non-ST elevated myocardial infarction) (HCC) 10/18/2021   Total knee replacement status 04/30/2020   Primary osteoarthritis of right knee 03/28/2020   CME (cystoid macular edema), left 07/07/2019   Nonexudative age-related macular degeneration, bilateral, intermediate dry stage 07/07/2019   SOB (shortness of breath) on exertion 08/22/2017   Chest pain with high risk for cardiac etiology 08/22/2017   Recurrent left inguinal hernia 01/04/2017   Allergic rhinitis 06/17/2015   CAD in native artery 06/17/2015   A-fib (HCC) 06/17/2015   Gonalgia 06/17/2015   Polypharmacy 06/17/2015   Cold sore 06/17/2015   Difficulty hearing 06/17/2015   HLD (hyperlipidemia) 06/17/2015   Essential hypertension 06/17/2015   Hypothyroidism 06/17/2015   Meniere's disease 06/17/2015   Buzzing in ear 06/17/2015   Beat, premature ventricular 06/17/2015   Status post total left knee replacement 04/13/2015   ONSET DATE: Beginning ~4-5 years ago, but worsening over the last 1-1.5 years  REFERRING DIAG: M19.041,M19.042 (ICD-10-CM) - Primary osteoarthritis of both hands   THERAPY DIAG:  Primary osteoarthritis of both hands  Muscle weakness (generalized)  Other lack of coordination  Rationale for Evaluation and Treatment: Rehabilitation  SUBJECTIVE:  SUBJECTIVE STATEMENT: Pt reports he enjoys using his putty while reading. Pt accompanied by: self  PERTINENT HISTORY: Per medical record from rheumatologist appt on 05/05/24:  Subjective:HPI  Gary Mueller is a 83 y.o. male is here today for follow up of inflammation arthritis, osteoarthritis. The patient's allergies, current medications, past family history, past medical history, past social history, past surgical history and problem list were reviewed and updated as appropriate.   He is having  pain of the left 2nd and 4th PIP joints. He is not able to form a fist. He has poor grip. He does take Tylenol  for pains.  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  -- Continue to monitor of the methotrexate  -- Previous cortisone injection has helped the hand joints  -- Can take Tylenol  for pains as needed  -- Refer to OT for further evaluation  Return in about 6 months (around 11/05/2024) for Routine Follow Up.   PRECAUTIONS: Other: maintain joint protection strategies/cumulative trauma prevention  RED FLAGS: None   WEIGHT BEARING RESTRICTIONS: No  PAIN: 06/17/24: 3-4/10 in bilat hands Are you having pain? Yes: NPRS scale: 3-4 in bilat hands at rest, 7-8 with activity  Pain location: bilat hands, but increased tenderness to touch in the L 4th finger and thumb, R 5th  digit (burning)   Pain description: sharp in both hands, burning in the R 5th digit and L thumb CMC  Aggravating factors: activity Relieving factors: elevating, rest, tylenol , heat   FALLS: Has patient fallen in last 6 months? No  LIVING ENVIRONMENT: Lives with: lives with their family, lives with adult son   PLOF: Retired from owning a Occupational [employer]; Currently plays golf a couple days per week (must use built up golf grips and has modified his grip), exercises at the [employer] at [facility] 3-4 times a week, walks dog 2-3x per week.    PATIENT GOALS: I'd like to be able to use them better, get more movement in my hands.   NEXT MD VISIT: Return to Dr. Tobie in ~6 months, per chart (Rheumatology)  OBJECTIVE:  Note: Objective measures were completed at Evaluation unless otherwise noted.  HAND DOMINANCE:  Right  ADLs: Eating: indep; pt reports no difficulties with cutting food or manipulating eating utensils Grooming: indep Upper body dressing: difficulty managing small buttons on a dress shirt; extra time needed Lower body dressing: pt tends to buy pants without buttons d/t difficulty with fasteners  Toileting: indep Bathing: pt reports that he frequently drops his soap d/t inability to fully close either hand Tub shower transfers: pt does have grab bars in shower, but reports he is still able to grasp bars without difficulty  Equipment: jar opener for kitchen  FUNCTIONAL OUTCOME MEASURES: MAM-20 for musculoskeletal conditions: TBD next session 05/15/24: 60/80 06/12/24: 66/80  UPPER EXTREMITY ROM:     Active ROM Right eval Right 06/12/24 Left eval Left 06/12/24  Thumb MCP (0-60)      Thumb IP (0-80)      Thumb Radial abd/add (0-55)       Thumb Palmar abd/add (0-45)       Thumb Opposition to Small Finger       Index MCP (0-90) 80* flexion 85  78* flexion 80  Index PIP (0-100)       Index DIP (0-70)        Long MCP (0-90)        Long PIP (0-100) 20* flexion  60     Long DIP (0-70) 35* of radial deviation (passively -25* from neutral)  30* of radial deviation (passively -10* from neutral)     Ring MCP (0-90)        Ring PIP (0-100)        Ring DIP (0-70)        Little MCP (0-90)        Little PIP (0-100)        Little DIP (0-70)        (Blank rows = not tested)  Eval: Tip to palm: R LF lacking 3 cm; 06/12/24: R LF lacking 1.5 cm Eval: Tip to palm: L IF lacking 2.5 cm; 06/12/24: L IF lacking 2.0 cm Eval: Tip to palm: L RF lacking 1.5 cm; 06/12/24: L RF able to fully touch tip to palm      Eval: Able to oppose all digits to thumb on each hand, but thumb touches medial aspect (not tip) of 5th digit DIP on each hand; 06/12/24: No change   UPPER EXTREMITY MMT:     MMT Right eval Left eval  Shoulder flexion    Shoulder abduction    Shoulder adduction    Shoulder extension     Shoulder internal rotation    Shoulder external rotation    Middle trapezius    Lower  trapezius    Elbow flexion    Elbow extension    Wrist flexion 5 5  Wrist extension 5 5  Wrist ulnar deviation    Wrist radial deviation    Wrist pronation    Wrist supination    (Blank rows = not tested)  HAND FUNCTION: Grip strength: Right: 35 lbs; Left: 28 lbs, Lateral pinch: Right: 13 lbs, Left: 11 lbs, and 3 point pinch: Right: 9 lbs, Left: 8 lbs 06/12/24: Grip Right: 51 lbs; Left: 46 lbs, lateral pinch: Right: 14 lbs, Left: 11 lbs, and 3 point pinch: Right: 12 lbs, Left: 12 lbs   COORDINATION: 9 Hole Peg test: Right: 30 sec; Left: 30 sec 06/12/24: Right: 28 sec; Left: 27 sec   SENSATION: WFL  EDEMA:    L RF PIP circumference: 7.1 cm (Pt unable to doff wedding ring from L RF for 2 months now d/t increased swelling in digits); R RF PIP circumference: 6.9 cm 06/12/24: L RF PIP circumference: 6.9 cm  COGNITION: Overall cognitive status: Within functional limits for tasks assessed  OBSERVATIONS:  Pt pleasant/cooperative, and appears eager to improve functional use of hands with trial of OT.  TREATMENT DATE: 06/19/24 Moist heat used intermittently throughout session to manage pain and promote muscle relaxation for improving digit ROM in bilat hands.  Manual Therapy: -Performed edema massage and soft tissue massage to MP, PIP, DIP joints in each digit of R/L hands, working to reduce pain and swelling to improve digit ROM for functional activities.   Therapeutic Exercise: -Gentle passive stretching to digits 1-5 in each hand to promote isolated flex/ext at the MP, PIP, and DIP joints (joint blocking) for improving grasp/release; increased focus on L IF and R LF with gentle joint distraction during flexion at each joint for increased range -Reps of R/L active assisted composite fisting/digit ext for increasing range tip to palm for holding/storing ADL supplies in either hand. -Gentle passive  stretching at the R LF DIP to reduce radial deviation; Ktape applied end of session for prolonged stretch. Ktape: anchoring first end to radial side of base of LF, extending overtop fingertip with ~75% stretch, anchoring second end to ulnar side of base of LF to reduce radial deviation. -Issued red theraputty and performed hand strengthening 2 set x 10 reps each: gross grip loop, lateral pinch, digit extension, single digit extension loop, thumb opposition, and lumbical ex. Good recall of technique, cues for slower, precise movements.                                                                                                               PATIENT EDUCATION: Education details: pain management strategies Person educated: Patient Education method: Explanation Education comprehension: verbalized understanding  HOME EXERCISE PROGRAM: Yellow theraband; edema massage to digits in either hand  GOALS: Goals reviewed with patient? Yes  SHORT TERM GOALS: Target date: 06/24/24  Pt will be indep to perform HEP for improving bilat hand flexibility, strength, and coordination. Baseline: Eval: HEP not yet initiated; 06/12/24: Indep and consistent to  perform HEP daily Goal status: achieved  2.  Pt will be indep to verbalize 2-3 joint protection/cumulative trauma prevention strategies to reduce pain in R/L hands.  Baseline: Eval: Education not yet initiated; 06/12/24: indep to implement strategies reviewed in previous sessions Goal status: achieved  3.  Pt will be indep to verbalize and implement 1-2 pain management strategies to reduce pain in bilat hands.  Baseline: Eval: Education not yet initiated; 06/12/24: Pt has started taking tylenol  prn/per recommendation from rheumatologist.  Pt uses heat regularly in the home and performs gentle stretching to reduce pain/stiffness in joints of hands.  Goal status: achieved  4.  Pt will be indep to monitor skin integrity of R LF while donning/doffing finger  splint in order to reduce risk of skin breakdown. Baseline: Eval: Splint not yet issued/education not yet initiated; 06/12/24: using Ktape in place of splint; pt has had no skin breakdown from regular use of Ktape and is indep to monitor for any adverse reactions Goal status: achieved  LONG TERM GOALS: Target date: 08/05/24  Pt will increase MAM-20 (for musculoskeletal conditions) score by 9 or more points in order to improve pt's perceived functional use of bilat hands with daily tasks. Baseline: Eval: MAM-20 to be given next session; 05/15/24: 60/80; 06/12/24: 66/80 Goal status: in progress  2.  Pt will increase bilat grip strength by 10 or more lbs in each hand to ease ability to open containers. Baseline: Eval: R 35 lbs, L 28 lbs (below age range norms); 06/12/24: R 51 lbs; Left: 46 lbs Goal status: achieved/exceeded  3.  Pt will increase lateral pinch strength by 3 lbs or more in each hand to ease ability to open food packages.  Baseline: Eval: R 13 lbs, L 11 lbs; 06/12/24: R 14 lbs, Left: 11 lbs Goal status: in progress  4.  Pt will increase bilat hand flexibility to enable ability to formulate a fully closed fist to reduce frequency of dropping smaller ADL supplies in either hand. Baseline: Eval: (Tip to palm) R LF lacking 3cm, L IF lacking 2.5 cm, L RF lacking 1.5 cm; 06/12/24: R LF lacking 1.5 cm, L IF lacking 2.0 cm, L RF able to full touch tip to palm Goal status: in progress  5.  Pt will follow recommended wearing schedule of Ktape with at least 75% adherence to reduce radial deviation in R LF DIP joint and reduce  risk of worsening hand deformity in R dominant hand. Baseline: Eval: Splint not yet issued; Pt presents with 35* of radial deviation on R LF DIP joint (able to passively reduce to 25*); 06/12/24: using Ktape regularly in place of oval 8; now 30* of radial deviation (passively -10* from neutral)  Goal status: in progress/revised (oval 8 not used; transitioned to Ktape reduce  risk of skin breakdown from splint)  6.  Pt will tolerate manual therapy, therapeutic modalities, and exercises to decrease pain in bilat hands to a reported 3/10 pain or less with daily activities.   Baseline: Eval: 3-4/10 pain in bilat hands at rest, 7-8/10 pain with activity; 06/12/24: pain can range from 0-6 at rest, and 7-8 with activity, but typically improves with heat, soft tissue massage, occasional use of compression gloves, and activity modification Goal status: in progress  7.  Pt will tolerate and implement edema management strategies to reduce edema in bilat hands to allow doffing/donning wedding ring. Baseline: Pt reports he's been unable to doff wedding ring for ~2 months, but previously donned/doffed ring daily; L  RF PIP joint circumference 7.1 cm, R 6.9 cm; 06/12/24: L RF PIP: 6.9 cm; pt is able to don/doff wedding ring and does plan to get ring re-sized for improved comfort/fit of ring on this finger Goal status: achieved    ASSESSMENT:  CLINICAL IMPRESSION: Pt continues to tolerate manual therapy and passive stretching well this date, noting improvements with composite fisting in bilat hands and no increased pain. Reports yellow theraputty too easy, issued red theraputty with HEP reviewed, good recall, educated on watching for increased pain with switch to red putty.  Ktape applied end of session to R 5th digit as noted above for reducing radial deviation at the DIP joint.  Pt consistently tolerating ~75% stretch with Ktape to this joint.  Pt will continue to benefit from skilled OT to address pain, stiffness, and weakness in bilat hands in order to continue to improve indep and tolerance to ADL/IADL tasks.    PERFORMANCE DEFICITS: in functional skills including ADLs, IADLs, coordination, dexterity, edema, ROM, strength, pain, flexibility, Fine motor control, body mechanics, decreased knowledge of precautions, decreased knowledge of use of DME, and UE functional use, and  psychosocial skills including coping strategies, environmental adaptation, habits, and routines and behaviors.   IMPAIRMENTS: are limiting patient from ADLs, IADLs, and leisure.   COMORBIDITIES: has co-morbidities such as HTN, Afib, hx of Nstemi, HLD that affects occupational performance. Patient will benefit from skilled OT to address above impairments and improve overall function.  MODIFICATION OR ASSISTANCE TO COMPLETE EVALUATION: No modification of tasks or assist necessary to complete an evaluation.  OT OCCUPATIONAL PROFILE AND HISTORY: Detailed assessment: Review of records and additional review of physical, cognitive, psychosocial history related to current functional performance.  CLINICAL DECISION MAKING: Moderate - several treatment options, min-mod task modification necessary  REHAB POTENTIAL: Good  EVALUATION COMPLEXITY: Moderate    PLAN:  OT FREQUENCY: 2x/week  OT DURATION: 12 weeks  PLANNED INTERVENTIONS: 97168 OT Re-evaluation, 97535 self care/ADL training, 02889 therapeutic exercise, 97530 therapeutic activity, 97140 manual therapy, 97018 paraffin, 02989 moist heat, 97010 cryotherapy, 97034 contrast bath, 97760 Orthotic Initial, 97763 Orthotic/Prosthetic subsequent, passive range of motion, coping strategies training, patient/family education, and DME and/or AE instructions  RECOMMENDED OTHER SERVICES: None at this time  CONSULTED AND AGREED WITH PLAN OF CARE: Patient  PLAN FOR NEXT SESSION: see above  Elston Slot, M.S. OTR/L  06/19/24, 1:44 PM  ascom [phone]  Elston JINNY Slot, OT 06/19/2024, 1:44 PM

## 2024-06-24 ENCOUNTER — Ambulatory Visit: Attending: Rheumatology

## 2024-06-24 DIAGNOSIS — R278 Other lack of coordination: Secondary | ICD-10-CM | POA: Diagnosis present

## 2024-06-24 DIAGNOSIS — M6281 Muscle weakness (generalized): Secondary | ICD-10-CM | POA: Insufficient documentation

## 2024-06-24 DIAGNOSIS — M19042 Primary osteoarthritis, left hand: Secondary | ICD-10-CM | POA: Diagnosis present

## 2024-06-24 DIAGNOSIS — M19041 Primary osteoarthritis, right hand: Secondary | ICD-10-CM | POA: Insufficient documentation

## 2024-06-24 NOTE — Therapy (Signed)
 OUTPATIENT OCCUPATIONAL THERAPY ORTHO TREATMENT NOTE   Patient Name: Gary Mueller MRN: [id number] DOB:08-26-1941, 83 y.o., male Today's Date: 06/29/2024  PCP: Dr. Ophelia Sage ([facility]) REFERRING PROVIDER: Tobie Lady POUR, MD (Rheumatology)  END OF SESSION:   OT End of Session - 06/29/24 1325     Visit Number 14    Number of Visits 24    Date for OT Re-Evaluation 08/05/24    OT Start Time 1315    OT Stop Time 1400    OT Time Calculation (min) 45 min    Activity Tolerance Patient tolerated treatment well    Behavior During Therapy [facility] for tasks assessed/performed         Past Medical History:  Diagnosis Date   Allergy    Anxiety    Arthritis    Cancer (HCC) 10/20/2016   Basal Cell Skin Cancer; lip, neck   Cystoid macular edema of left eye 07/07/2019   Depression    Dysrhythmia    A FIB   GERD (gastroesophageal reflux disease)    HOH (hard of hearing)    Bilateral   Hyperlipidemia    Hypertension    Patient denies   Hypothyroidism    IBS (irritable bowel syndrome)    Inguinal hernia    bilateral   Pneumonia    Thyroid  disease    Tinnitus of both ears    Past Surgical History:  Procedure Laterality Date   ANTERIOR VITRECTOMY Left 11/28/2018   Procedure: ANTERIOR VITRECTOMY;  Surgeon: Ferol Rogue, MD;  Location: [facility] ORS;  Service: Ophthalmology;  Laterality: Left;   CARDIAC CATHETERIZATION     CATARACT EXTRACTION W/PHACO Right 10/10/2018   Procedure: CATARACT EXTRACTION PHACO AND INTRAOCULAR LENS PLACEMENT (IOC);  Surgeon: Ferol Rogue, MD;  Location: [facility] ORS;  Service: Ophthalmology;  Laterality: Right;  US  00:41 CDE 6.87 Fluid pack lot # [id number]   CATARACT EXTRACTION W/PHACO Left 11/28/2018   Procedure: CATARACT EXTRACTION PHACO AND INTRAOCULAR LENS PLACEMENT (IOC)-LEFT;  Surgeon: Ferol Rogue, MD;  Location: [facility] ORS;  Service: Ophthalmology;  Laterality: Left;  US  00:47.7 CDE 8.92 Fluid Pack lot # [id number]   CHOLECYSTECTOMY  1968   CHONDROPLASTY  Right 01/02/2018   Procedure: CHONDROPLASTY;  Surgeon: Mardee Lynwood SQUIBB, MD;  Location: [facility] ORS;  Service: Orthopedics;  Laterality: Right;   COLONOSCOPY  2014   CORONARY STENT INTERVENTION N/A 10/19/2021   Procedure: CORONARY STENT INTERVENTION;  Surgeon: Ammon Blunt, MD;  Location: [facility] INV[facility]IVE [facility];  Service: Cardiovascular;  Laterality: N/A;   EYE SURGERY     HERNIA REPAIR Left 04/18/1996   inguinal hernia repair/ Dr Dessa   HERNIA REPAIR Right 02/26/1996   Dr Dessa   HERNIA REPAIR Left 08/07/2002   Dr Dessa   JOINT REPLACEMENT     KNEE ARTHROPLASTY Right 04/30/2020   Procedure: COMPUTER ASSISTED TOTAL KNEE ARTHROPLASTY;  Surgeon: Mardee Lynwood SQUIBB, MD;  Location: [facility] OR[facility];  Service: Orthopedics;  Laterality: Right;   KNEE ARTHROSCOPY Right 01/02/2018   Procedure: ARTHROSCOPY KNEE;  Surgeon: Mardee Lynwood SQUIBB, MD;  Location: [facility] OR[facility];  Service: Orthopedics;  Laterality: Right;   KNEE ARTHROSCOPY Left 02/14/2012   partial menisectomy and chondroplasty   KNEE ARTHROSCOPY WITH MEDIAL MENISECTOMY Right 01/02/2018   Procedure: KNEE ARTHROSCOPY WITH MEDIAL MENISECTOMY;  Surgeon: Mardee Lynwood SQUIBB, MD;  Location: [facility] OR[facility];  Service: Orthopedics;  Laterality: Right;   LEFT HEART CATH AND CORONARY ANGIOGRAPHY N/A 10/19/2021   Procedure: LEFT HEART CATH AND CORONARY ANGIOGRAPHY;  Surgeon: Ammon Blunt,  MD;  Location: [facility];  Service: Cardiovascular;  Laterality: N/A;   LEFT HEART CATH AND CORONARY ANGIOGRAPHY Left 10/10/2022   Procedure: LEFT HEART CATH AND CORONARY ANGIOGRAPHY;  Surgeon: Ammon Blunt, MD;  Location: [facility];  Service: Cardiovascular;  Laterality: Left;   TONSILLECTOMY     as a child   TOTAL KNEE ARTHROPLASTY Left 03/29/2015   [facility] Dr. Mardee   VASECTOMY     Patient Active Problem List   Diagnosis Date Noted   Encounter for fitting and adjustment of hearing aid 04/04/2022   Sensorineural hearing loss, bilateral 04/04/2022    Atrial fibrillation (HCC) 04/04/2022   Hypertension 04/04/2022   Gastroesophageal reflux disease without esophagitis    Rheumatoid arthritis (HCC)    NSTEMI (non-ST elevated myocardial infarction) (HCC) 10/18/2021   Total knee replacement status 04/30/2020   Primary osteoarthritis of right knee 03/28/2020   CME (cystoid macular edema), left 07/07/2019   Nonexudative age-related macular degeneration, bilateral, intermediate dry stage 07/07/2019   SOB (shortness of breath) on exertion 08/22/2017   Chest pain with high risk for cardiac etiology 08/22/2017   Recurrent left inguinal hernia 01/04/2017   Allergic rhinitis 06/17/2015   CAD in native artery 06/17/2015   A-fib (HCC) 06/17/2015   Gonalgia 06/17/2015   Polypharmacy 06/17/2015   Cold sore 06/17/2015   Difficulty hearing 06/17/2015   HLD (hyperlipidemia) 06/17/2015   Essential hypertension 06/17/2015   Hypothyroidism 06/17/2015   Meniere's disease 06/17/2015   Buzzing in ear 06/17/2015   Beat, premature ventricular 06/17/2015   Status post total left knee replacement 04/13/2015   ONSET DATE: Beginning ~4-5 years ago, but worsening over the last 1-1.5 years  REFERRING DIAG: M19.041,M19.042 (ICD-10-CM) - Primary osteoarthritis of both hands   THERAPY DIAG:  Muscle weakness (generalized)  Other lack of coordination  Primary osteoarthritis of both hands  Rationale for Evaluation and Treatment: Rehabilitation  SUBJECTIVE:  SUBJECTIVE STATEMENT: Pt reports he continues to feel improvements in his hands, referring to flexibility and strength. Pt accompanied by: self  PERTINENT HISTORY: Per medical record from rheumatologist appt on 05/05/24:  Subjective:HPI  [name] is a 83 y.o. male is here today for follow up of inflammation arthritis, osteoarthritis. The patient's allergies, current medications, past family history, past medical history, past social history, past surgical history and problem list were reviewed  and updated as appropriate.   He is having pain of the left 2nd and 4th PIP joints. He is not able to form a fist. He has poor grip. He does take Tylenol  for pains.  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  -- Continue to monitor of the methotrexate  -- Previous cortisone injection has helped the hand joints  -- Can take Tylenol  for pains as needed  -- Refer to OT for further evaluation  Return in about 6 months (around 11/05/2024) for Routine Follow Up.   PRECAUTIONS: Other: maintain joint protection strategies/cumulative trauma prevention  RED FLAGS: None   WEIGHT BEARING RESTRICTIONS: No  PAIN: 06/24/24: 3-4/10 in bilat hands Are you having pain? Yes: NPRS scale: 3-4 in bilat hands at rest, 7-8 with activity  Pain location: bilat hands, but increased tenderness to touch in the L 4th  finger and thumb, R 5th digit (burning)   Pain description: sharp in both hands, burning in the R 5th digit and L thumb CMC  Aggravating factors: activity Relieving factors: elevating, rest, tylenol , heat   FALLS: Has patient fallen in last 6 months? No  LIVING ENVIRONMENT: Lives with: lives with their family, lives with adult son   PLOF: Retired from owning a Occupational [employer]; Currently plays golf a couple days per week (must use built up golf grips and has modified his grip), exercises at the [employer] at [facility] 3-4 times a week, walks dog 2-3x per week.    PATIENT GOALS: I'd like to be able to use them better, get more movement in my hands.   NEXT MD VISIT: Return to Dr. Tobie in ~6 months, per chart (Rheumatology)  OBJECTIVE:  Note: Objective measures were completed at Evaluation unless otherwise  noted.  HAND DOMINANCE: Right  ADLs: Eating: indep; pt reports no difficulties with cutting food or manipulating eating utensils Grooming: indep Upper body dressing: difficulty managing small buttons on a dress shirt; extra time needed Lower body dressing: pt tends to buy pants without buttons d/t difficulty with fasteners  Toileting: indep Bathing: pt reports that he frequently drops his soap d/t inability to fully close either hand Tub shower transfers: pt does have grab bars in shower, but reports he is still able to grasp bars without difficulty  Equipment: jar opener for kitchen  FUNCTIONAL OUTCOME MEASURES: MAM-20 for musculoskeletal conditions: TBD next session 05/15/24: 60/80 06/12/24: 66/80  UPPER EXTREMITY ROM:     Active ROM Right eval Right 06/12/24 Left eval Left 06/12/24  Thumb MCP (0-60)      Thumb IP (0-80)      Thumb Radial abd/add (0-55)       Thumb Palmar abd/add (0-45)       Thumb Opposition to Small Finger       Index MCP (0-90) 80* flexion 85  78* flexion 80  Index PIP (0-100)       Index DIP (0-70)        Long MCP (0-90)        Long PIP (0-100) 20* flexion  60     Long DIP (0-70) 35* of radial deviation (passively -25* from neutral)  30* of radial deviation (passively -10* from neutral)     Ring MCP (0-90)        Ring PIP (0-100)        Ring DIP (0-70)        Little MCP (0-90)        Little PIP (0-100)        Little DIP (0-70)        (Blank rows = not tested)  Eval: Tip to palm: R LF lacking 3 cm; 06/12/24: R LF lacking 1.5 cm Eval: Tip to palm: L IF lacking 2.5 cm; 06/12/24: L IF lacking 2.0 cm Eval: Tip to palm: L RF lacking 1.5 cm; 06/12/24: L RF able to fully touch tip to palm      Eval: Able to oppose all digits to thumb on each hand, but thumb touches medial aspect (not tip) of 5th digit DIP on each hand; 06/12/24: No change   UPPER EXTREMITY MMT:     MMT Right eval Left eval  Shoulder flexion    Shoulder abduction    Shoulder adduction     Shoulder extension    Shoulder internal rotation    Shoulder external rotation    Middle  trapezius    Lower trapezius    Elbow flexion    Elbow extension    Wrist flexion 5 5  Wrist extension 5 5  Wrist ulnar deviation    Wrist radial deviation    Wrist pronation    Wrist supination    (Blank rows = not tested)  HAND FUNCTION: Grip strength: Right: 35 lbs; Left: 28 lbs, Lateral pinch: Right: 13 lbs, Left: 11 lbs, and 3 point pinch: Right: 9 lbs, Left: 8 lbs 06/12/24: Grip Right: 51 lbs; Left: 46 lbs, lateral pinch: Right: 14 lbs, Left: 11 lbs, and 3 point pinch: Right: 12 lbs, Left: 12 lbs   COORDINATION: 9 Hole Peg test: Right: 30 sec; Left: 30 sec 06/12/24: Right: 28 sec; Left: 27 sec   SENSATION: WFL  EDEMA:    L RF PIP circumference: 7.1 cm (Pt unable to doff wedding ring from L RF for 2 months now d/t increased swelling in digits); R RF PIP circumference: 6.9 cm 06/12/24: L RF PIP circumference: 6.9 cm  COGNITION: Overall cognitive status: Within functional limits for tasks assessed  OBSERVATIONS:  Pt pleasant/cooperative, and appears eager to improve functional use of hands with trial of OT.  TREATMENT DATE: 06/24/24 Moist heat used intermittently throughout session to manage pain and promote muscle relaxation for improving digit ROM in bilat hands.  Manual Therapy: -Performed edema massage and soft tissue massage to MP, PIP, DIP joints in each digit of R/L hands, working to reduce pain and swelling to improve digit ROM for functional activities.   Therapeutic Exercise: -Gentle passive stretching to digits 1-5 in each hand to promote isolated flex/ext at the MP, PIP, and DIP joints (joint blocking) for improving grasp/release; increased focus on L IF and R LF with gentle joint distraction during flexion at each joint for increased range -Reps of R/L active assisted composite fisting/digit ext for increasing range tip to palm for holding/storing ADL supplies in either  hand. -Gentle passive stretching at the R LF DIP to reduce radial deviation                                                                                                              PATIENT EDUCATION: Education details: pain management strategies Person educated: Patient Education method: Explanation Education comprehension: verbalized understanding  HOME EXERCISE PROGRAM: Yellow theraband; edema massage to digits in either hand  GOALS: Goals reviewed with patient? Yes  SHORT TERM GOALS: Target date: 06/24/24  Pt will be indep to perform HEP for improving bilat hand flexibility, strength, and coordination. Baseline: Eval: HEP not yet initiated; 06/12/24: Indep and consistent to perform HEP daily Goal status: achieved  2.  Pt will be indep to verbalize 2-3 joint protection/cumulative trauma prevention strategies to reduce pain in R/L hands.  Baseline: Eval: Education not yet initiated; 06/12/24: indep to implement strategies reviewed in previous sessions Goal status: achieved  3.  Pt will be indep to verbalize and implement 1-2 pain management strategies to reduce pain in bilat hands.  Baseline: Eval: Education not yet  initiated; 06/12/24: Pt has started taking tylenol  prn/per recommendation from rheumatologist.  Pt uses heat regularly in the home and performs gentle stretching to reduce pain/stiffness in joints of hands.  Goal status: achieved  4.  Pt will be indep to monitor skin integrity of R LF while donning/doffing finger splint in order to reduce risk of skin breakdown. Baseline: Eval: Splint not yet issued/education not yet initiated; 06/12/24: using Ktape in place of splint; pt has had no skin breakdown from regular use of Ktape and is indep to monitor for any adverse reactions Goal status: achieved  LONG TERM GOALS: Target date: 08/05/24  Pt will increase MAM-20 (for musculoskeletal conditions) score by 9 or more points in order to improve pt's perceived functional use of  bilat hands with daily tasks. Baseline: Eval: MAM-20 to be given next session; 05/15/24: 60/80; 06/12/24: 66/80 Goal status: in progress  2.  Pt will increase bilat grip strength by 10 or more lbs in each hand to ease ability to open containers. Baseline: Eval: R 35 lbs, L 28 lbs (below age range norms); 06/12/24: R 51 lbs; Left: 46 lbs Goal status: achieved/exceeded  3.  Pt will increase lateral pinch strength by 3 lbs or more in each hand to ease ability to open food packages.  Baseline: Eval: R 13 lbs, L 11 lbs; 06/12/24: R 14 lbs, Left: 11 lbs Goal status: in progress  4.  Pt will increase bilat hand flexibility to enable ability to formulate a fully closed fist to reduce frequency of dropping smaller ADL supplies in either hand. Baseline: Eval: (Tip to palm) R LF lacking 3cm, L IF lacking 2.5 cm, L RF lacking 1.5 cm; 06/12/24: R LF lacking 1.5 cm, L IF lacking 2.0 cm, L RF able to full touch tip to palm Goal status: in progress  5.  Pt will follow recommended wearing schedule of Ktape with at least 75% adherence to reduce radial deviation in R LF DIP joint and reduce  risk of worsening hand deformity in R dominant hand. Baseline: Eval: Splint not yet issued; Pt presents with 35* of radial deviation on R LF DIP joint (able to passively reduce to 25*); 06/12/24: using Ktape regularly in place of oval 8; now 30* of radial deviation (passively -10* from neutral)  Goal status: in progress/revised (oval 8 not used; transitioned to Ktape reduce risk of skin breakdown from splint)  6.  Pt will tolerate manual therapy, therapeutic modalities, and exercises to decrease pain in bilat hands to a reported 3/10 pain or less with daily activities.   Baseline: Eval: 3-4/10 pain in bilat hands at rest, 7-8/10 pain with activity; 06/12/24: pain can range from 0-6 at rest, and 7-8 with activity, but typically improves with heat, soft tissue massage, occasional use of compression gloves, and activity  modification Goal status: in progress  7.  Pt will tolerate and implement edema management strategies to reduce edema in bilat hands to allow doffing/donning wedding ring. Baseline: Pt reports he's been unable to doff wedding ring for ~2 months, but previously donned/doffed ring daily; L RF PIP joint circumference 7.1 cm, R 6.9 cm; 06/12/24: L RF PIP: 6.9 cm; pt is able to don/doff wedding ring and does plan to get ring re-sized for improved comfort/fit of ring on this finger Goal status: achieved    ASSESSMENT:  CLINICAL IMPRESSION: Pt reports he continues to feel improvements in his hands, referring to flexibility and strength.  Pt continues to tolerate manual therapy and passive stretching well  this date, noting improvements with composite fisting in bilat hands and no increased pain.  Pt reports tolerating putty upgrade well without increases in pain. Pt will continue to benefit from skilled OT to address pain, stiffness, and weakness in bilat hands in order to continue to improve indep and tolerance to ADL/IADL tasks.    PERFORMANCE DEFICITS: in functional skills including ADLs, IADLs, coordination, dexterity, edema, ROM, strength, pain, flexibility, Fine motor control, body mechanics, decreased knowledge of precautions, decreased knowledge of use of DME, and UE functional use, and psychosocial skills including coping strategies, environmental adaptation, habits, and routines and behaviors.   IMPAIRMENTS: are limiting patient from ADLs, IADLs, and leisure.   COMORBIDITIES: has co-morbidities such as HTN, Afib, hx of Nstemi, HLD that affects occupational performance. Patient will benefit from skilled OT to address above impairments and improve overall function.  MODIFICATION OR ASSISTANCE TO COMPLETE EVALUATION: No modification of tasks or assist necessary to complete an evaluation.  OT OCCUPATIONAL PROFILE AND HISTORY: Detailed assessment: Review of records and additional review of physical,  cognitive, psychosocial history related to current functional performance.  CLINICAL DECISION MAKING: Moderate - several treatment options, min-mod task modification necessary  REHAB POTENTIAL: Good  EVALUATION COMPLEXITY: Moderate    PLAN:  OT FREQUENCY: 2x/week  OT DURATION: 12 weeks  PLANNED INTERVENTIONS: 97168 OT Re-evaluation, 97535 self care/ADL training, 02889 therapeutic exercise, 97530 therapeutic activity, 97140 manual therapy, 97018 paraffin, 02989 moist heat, 97010 cryotherapy, 97034 contrast bath, 97760 Orthotic Initial, 97763 Orthotic/Prosthetic subsequent, passive range of motion, coping strategies training, patient/family education, and DME and/or AE instructions  RECOMMENDED OTHER SERVICES: None at this time  CONSULTED AND AGREED WITH PLAN OF CARE: Patient  PLAN FOR NEXT SESSION: see above  Inocente Blazing, MS, OTR/L   Inocente MARLA Blazing, OT 06/29/2024, 1:26 PM

## 2024-06-26 ENCOUNTER — Ambulatory Visit

## 2024-06-26 DIAGNOSIS — M6281 Muscle weakness (generalized): Secondary | ICD-10-CM

## 2024-06-26 DIAGNOSIS — R278 Other lack of coordination: Secondary | ICD-10-CM

## 2024-06-26 DIAGNOSIS — M19041 Primary osteoarthritis, right hand: Secondary | ICD-10-CM

## 2024-06-26 NOTE — Therapy (Signed)
 OUTPATIENT OCCUPATIONAL THERAPY ORTHO TREATMENT NOTE   Patient Name: Gary Mueller MRN: [id number] DOB:1941-03-20, 83 y.o., male Today's Date: 06/29/2024  PCP: Dr. Ophelia Sage ([facility]) REFERRING PROVIDER: Tobie Lady POUR, MD (Rheumatology)  END OF SESSION:   OT End of Session - 06/29/24 1825     Visit Number 15    Number of Visits 24    Date for OT Re-Evaluation 08/05/24    OT Start Time 1400    OT Stop Time 1445    OT Time Calculation (min) 45 min    Activity Tolerance Patient tolerated treatment well    Behavior During Therapy [facility] for tasks assessed/performed         Past Medical History:  Diagnosis Date   Allergy    Anxiety    Arthritis    Cancer (HCC) 10/20/2016   Basal Cell Skin Cancer; lip, neck   Cystoid macular edema of left eye 07/07/2019   Depression    Dysrhythmia    A FIB   GERD (gastroesophageal reflux disease)    HOH (hard of hearing)    Bilateral   Hyperlipidemia    Hypertension    Patient denies   Hypothyroidism    IBS (irritable bowel syndrome)    Inguinal hernia    bilateral   Pneumonia    Thyroid  disease    Tinnitus of both ears    Past Surgical History:  Procedure Laterality Date   ANTERIOR VITRECTOMY Left 11/28/2018   Procedure: ANTERIOR VITRECTOMY;  Surgeon: Ferol Rogue, MD;  Location: [facility] ORS;  Service: Ophthalmology;  Laterality: Left;   CARDIAC CATHETERIZATION     CATARACT EXTRACTION W/PHACO Right 10/10/2018   Procedure: CATARACT EXTRACTION PHACO AND INTRAOCULAR LENS PLACEMENT (IOC);  Surgeon: Ferol Rogue, MD;  Location: [facility] ORS;  Service: Ophthalmology;  Laterality: Right;  US  00:41 CDE 6.87 Fluid pack lot # [id number]   CATARACT EXTRACTION W/PHACO Left 11/28/2018   Procedure: CATARACT EXTRACTION PHACO AND INTRAOCULAR LENS PLACEMENT (IOC)-LEFT;  Surgeon: Ferol Rogue, MD;  Location: [facility] ORS;  Service: Ophthalmology;  Laterality: Left;  US  00:47.7 CDE 8.92 Fluid Pack lot # [id number]   CHOLECYSTECTOMY  1968   CHONDROPLASTY  Right 01/02/2018   Procedure: CHONDROPLASTY;  Surgeon: Mardee Lynwood SQUIBB, MD;  Location: [facility] ORS;  Service: Orthopedics;  Laterality: Right;   COLONOSCOPY  2014   CORONARY STENT INTERVENTION N/A 10/19/2021   Procedure: CORONARY STENT INTERVENTION;  Surgeon: Ammon Blunt, MD;  Location: [facility] INVASIVE CV [facility];  Service: Cardiovascular;  Laterality: N/A;   EYE SURGERY     HERNIA REPAIR Left 04/18/1996   inguinal hernia repair/ Dr Dessa   HERNIA REPAIR Right 02/26/1996   Dr Dessa   HERNIA REPAIR Left 08/07/2002   Dr Dessa   JOINT REPLACEMENT     KNEE ARTHROPLASTY Right 04/30/2020   Procedure: COMPUTER ASSISTED TOTAL KNEE ARTHROPLASTY;  Surgeon: Mardee Lynwood SQUIBB, MD;  Location: [facility] OR[facility];  Service: Orthopedics;  Laterality: Right;   KNEE ARTHROSCOPY Right 01/02/2018   Procedure: ARTHROSCOPY KNEE;  Surgeon: Mardee Lynwood SQUIBB, MD;  Location: [facility] ORS;  Service: Orthopedics;  Laterality: Right;   KNEE ARTHROSCOPY Left 02/14/2012   partial menisectomy and chondroplasty   KNEE ARTHROSCOPY WITH MEDIAL MENISECTOMY Right 01/02/2018   Procedure: KNEE ARTHROSCOPY WITH MEDIAL MENISECTOMY;  Surgeon: Mardee Lynwood SQUIBB, MD;  Location: [facility] ORS;  Service: Orthopedics;  Laterality: Right;   LEFT HEART CATH AND CORONARY ANGIOGRAPHY N/A 10/19/2021   Procedure: LEFT HEART CATH AND CORONARY ANGIOGRAPHY;  Surgeon: Ammon Blunt,  MD;  Location: [facility];  Service: Cardiovascular;  Laterality: N/A;   LEFT HEART CATH AND CORONARY ANGIOGRAPHY Left 10/10/2022   Procedure: LEFT HEART CATH AND CORONARY ANGIOGRAPHY;  Surgeon: Ammon Blunt, MD;  Location: [facility];  Service: Cardiovascular;  Laterality: Left;   TONSILLECTOMY     as a child   TOTAL KNEE ARTHROPLASTY Left 03/29/2015   [facility] Dr. Mardee   VASECTOMY     Patient Active Problem List   Diagnosis Date Noted   Encounter for fitting and adjustment of hearing aid 04/04/2022   Sensorineural hearing loss, bilateral 04/04/2022    Atrial fibrillation (HCC) 04/04/2022   Hypertension 04/04/2022   Gastroesophageal reflux disease without esophagitis    Rheumatoid arthritis (HCC)    NSTEMI (non-ST elevated myocardial infarction) (HCC) 10/18/2021   Total knee replacement status 04/30/2020   Primary osteoarthritis of right knee 03/28/2020   CME (cystoid macular edema), left 07/07/2019   Nonexudative age-related macular degeneration, bilateral, intermediate dry stage 07/07/2019   SOB (shortness of breath) on exertion 08/22/2017   Chest pain with high risk for cardiac etiology 08/22/2017   Recurrent left inguinal hernia 01/04/2017   Allergic rhinitis 06/17/2015   CAD in native artery 06/17/2015   A-fib (HCC) 06/17/2015   Gonalgia 06/17/2015   Polypharmacy 06/17/2015   Cold sore 06/17/2015   Difficulty hearing 06/17/2015   HLD (hyperlipidemia) 06/17/2015   Essential hypertension 06/17/2015   Hypothyroidism 06/17/2015   Meniere's disease 06/17/2015   Buzzing in ear 06/17/2015   Beat, premature ventricular 06/17/2015   Status post total left knee replacement 04/13/2015   ONSET DATE: Beginning ~4-5 years ago, but worsening over the last 1-1.5 years  REFERRING DIAG: M19.041,M19.042 (ICD-10-CM) - Primary osteoarthritis of both hands   THERAPY DIAG:  Muscle weakness (generalized)  Other lack of coordination  Primary osteoarthritis of both hands  Rationale for Evaluation and Treatment: Rehabilitation  SUBJECTIVE:  SUBJECTIVE STATEMENT: Pt reports he continues to take 2 tylenol  per day, with 1 in the a.m. and 1 in the p.m. to reduce arthritic pain in hands.  Pt accompanied by: self  PERTINENT HISTORY: Per medical record from rheumatologist appt on 05/05/24:  Subjective:HPI  Gary Mueller is a 83 y.o. male is here today for follow up of inflammation arthritis, osteoarthritis. The patient's allergies, current medications, past family history, past medical history, past social history, past surgical history and  problem list were reviewed and updated as appropriate.   He is having pain of the left 2nd and 4th PIP joints. He is not able to form a fist. He has poor grip. He does take Tylenol  for pains.  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  -- Continue to monitor of the methotrexate  -- Previous cortisone injection has helped the hand joints  -- Can take Tylenol  for pains as needed  -- Refer to OT for further evaluation  Return in about 6 months (around 11/05/2024) for Routine Follow Up.   PRECAUTIONS: Other: maintain joint protection strategies/cumulative trauma prevention  RED FLAGS: None   WEIGHT BEARING RESTRICTIONS: No  PAIN: 06/26/24: 3-4/10 in bilat hands Are you having pain? Yes: NPRS scale: 3-4 in bilat hands at rest, 7-8 with activity  Pain  location: bilat hands, but increased tenderness to touch in the L 4th finger and thumb, R 5th digit (burning)   Pain description: sharp in both hands, burning in the R 5th digit and L thumb CMC  Aggravating factors: activity Relieving factors: elevating, rest, tylenol , heat   FALLS: Has patient fallen in last 6 months? No  LIVING ENVIRONMENT: Lives with: lives with their family, lives with adult son   PLOF: Retired from owning a Occupational [employer]; Currently plays golf a couple days per week (must use built up golf grips and has modified his grip), exercises at the [employer] at [facility] 3-4 times a week, walks dog 2-3x per week.    PATIENT GOALS: I'd like to be able to use them better, get more movement in my hands.   NEXT MD VISIT: Return to Dr. Tobie in ~6 months, per chart (Rheumatology)  OBJECTIVE:  Note: Objective measures were completed at  Evaluation unless otherwise noted.  HAND DOMINANCE: Right  ADLs: Eating: indep; pt reports no difficulties with cutting food or manipulating eating utensils Grooming: indep Upper body dressing: difficulty managing small buttons on a dress shirt; extra time needed Lower body dressing: pt tends to buy pants without buttons d/t difficulty with fasteners  Toileting: indep Bathing: pt reports that he frequently drops his soap d/t inability to fully close either hand Tub shower transfers: pt does have grab bars in shower, but reports he is still able to grasp bars without difficulty  Equipment: jar opener for kitchen  FUNCTIONAL OUTCOME MEASURES: MAM-20 for musculoskeletal conditions: TBD next session 05/15/24: 60/80 06/12/24: 66/80  UPPER EXTREMITY ROM:     Active ROM Right eval Right 06/12/24 Left eval Left 06/12/24  Thumb MCP (0-60)      Thumb IP (0-80)      Thumb Radial abd/add (0-55)       Thumb Palmar abd/add (0-45)       Thumb Opposition to Small Finger       Index MCP (0-90) 80* flexion 85  78* flexion 80  Index PIP (0-100)       Index DIP (0-70)        Long MCP (0-90)        Long PIP (0-100) 20* flexion  60     Long DIP (0-70) 35* of radial deviation (passively -25* from neutral)  30* of radial deviation (passively -10* from neutral)     Ring MCP (0-90)        Ring PIP (0-100)        Ring DIP (0-70)        Little MCP (0-90)        Little PIP (0-100)        Little DIP (0-70)        (Blank rows = not tested)  Eval: Tip to palm: R LF lacking 3 cm; 06/12/24: R LF lacking 1.5 cm Eval: Tip to palm: L IF lacking 2.5 cm; 06/12/24: L IF lacking 2.0 cm Eval: Tip to palm: L RF lacking 1.5 cm; 06/12/24: L RF able to fully touch tip to palm      Eval: Able to oppose all digits to thumb on each hand, but thumb touches medial aspect (not tip) of 5th digit DIP on each hand; 06/12/24: No change   UPPER EXTREMITY MMT:     MMT Right eval Left eval  Shoulder flexion    Shoulder  abduction    Shoulder adduction    Shoulder extension    Shoulder  internal rotation    Shoulder external rotation    Middle trapezius    Lower trapezius    Elbow flexion    Elbow extension    Wrist flexion 5 5  Wrist extension 5 5  Wrist ulnar deviation    Wrist radial deviation    Wrist pronation    Wrist supination    (Blank rows = not tested)  HAND FUNCTION: Grip strength: Right: 35 lbs; Left: 28 lbs, Lateral pinch: Right: 13 lbs, Left: 11 lbs, and 3 point pinch: Right: 9 lbs, Left: 8 lbs 06/12/24: Grip Right: 51 lbs; Left: 46 lbs, lateral pinch: Right: 14 lbs, Left: 11 lbs, and 3 point pinch: Right: 12 lbs, Left: 12 lbs   COORDINATION: 9 Hole Peg test: Right: 30 sec; Left: 30 sec 06/12/24: Right: 28 sec; Left: 27 sec   SENSATION: WFL  EDEMA:    L RF PIP circumference: 7.1 cm (Pt unable to doff wedding ring from L RF for 2 months now d/t increased swelling in digits); R RF PIP circumference: 6.9 cm 06/12/24: L RF PIP circumference: 6.9 cm  COGNITION: Overall cognitive status: Within functional limits for tasks assessed  OBSERVATIONS:  Pt pleasant/cooperative, and appears eager to improve functional use of hands with trial of OT.  TREATMENT DATE: 06/26/24 Moist heat used intermittently throughout session to manage pain and promote muscle relaxation for improving digit ROM in bilat hands.  Therapeutic Exercise: -Gentle passive stretching to digits 1-5 in each hand to promote isolated flex/ext at the MP, PIP, and DIP joints (joint blocking) for improving grasp/release; increased focus on L IF and R LF with gentle joint distraction during flexion at each joint for increased range -Reps of R/L active assisted composite fisting/digit ext for increasing range tip to palm for holding/storing ADL supplies in either hand. -Gentle passive stretching at the R LF DIP to reduce radial deviation -Bilat grip strengthening with hand gripper set at moderate resistance with 2 red bands: 5  sets of 10 reps each, alternating hands           -Facilitated pinch strengthening to promote lateral and 3 point pinch in R/L hands; completed 2 sets for each hand and each pinch type with moderate-strong resistance pins (green, blue, black).                                                                                                      PATIENT EDUCATION: Education details: HEP Person educated: Patient Education method: Explanation Education comprehension: verbalized understanding  HOME EXERCISE PROGRAM: Yellow/red theraputty; edema massage to digits in either hand  GOALS: Goals reviewed with patient? Yes  SHORT TERM GOALS: Target date: 06/24/24  Pt will be indep to perform HEP for improving bilat hand flexibility, strength, and coordination. Baseline: Eval: HEP not yet initiated; 06/12/24: Indep and consistent to perform HEP daily Goal status: achieved  2.  Pt will be indep to verbalize 2-3 joint protection/cumulative trauma prevention strategies to reduce pain in R/L hands.  Baseline: Eval: Education not yet initiated; 06/12/24: indep to implement strategies reviewed in previous sessions Goal  status: achieved  3.  Pt will be indep to verbalize and implement 1-2 pain management strategies to reduce pain in bilat hands.  Baseline: Eval: Education not yet initiated; 06/12/24: Pt has started taking tylenol  prn/per recommendation from rheumatologist.  Pt uses heat regularly in the home and performs gentle stretching to reduce pain/stiffness in joints of hands.  Goal status: achieved  4.  Pt will be indep to monitor skin integrity of R LF while donning/doffing finger splint in order to reduce risk of skin breakdown. Baseline: Eval: Splint not yet issued/education not yet initiated; 06/12/24: using Ktape in place of splint; pt has had no skin breakdown from regular use of Ktape and is indep to monitor for any adverse reactions Goal status: achieved  LONG TERM GOALS: Target date:  08/05/24  Pt will increase MAM-20 (for musculoskeletal conditions) score by 9 or more points in order to improve pt's perceived functional use of bilat hands with daily tasks. Baseline: Eval: MAM-20 to be given next session; 05/15/24: 60/80; 06/12/24: 66/80 Goal status: in progress  2.  Pt will increase bilat grip strength by 10 or more lbs in each hand to ease ability to open containers. Baseline: Eval: R 35 lbs, L 28 lbs (below age range norms); 06/12/24: R 51 lbs; Left: 46 lbs Goal status: achieved/exceeded  3.  Pt will increase lateral pinch strength by 3 lbs or more in each hand to ease ability to open food packages.  Baseline: Eval: R 13 lbs, L 11 lbs; 06/12/24: R 14 lbs, Left: 11 lbs Goal status: in progress  4.  Pt will increase bilat hand flexibility to enable ability to formulate a fully closed fist to reduce frequency of dropping smaller ADL supplies in either hand. Baseline: Eval: (Tip to palm) R LF lacking 3cm, L IF lacking 2.5 cm, L RF lacking 1.5 cm; 06/12/24: R LF lacking 1.5 cm, L IF lacking 2.0 cm, L RF able to full touch tip to palm Goal status: in progress  5.  Pt will follow recommended wearing schedule of Ktape with at least 75% adherence to reduce radial deviation in R LF DIP joint and reduce  risk of worsening hand deformity in R dominant hand. Baseline: Eval: Splint not yet issued; Pt presents with 35* of radial deviation on R LF DIP joint (able to passively reduce to 25*); 06/12/24: using Ktape regularly in place of oval 8; now 30* of radial deviation (passively -10* from neutral)  Goal status: in progress/revised (oval 8 not used; transitioned to Ktape reduce risk of skin breakdown from splint)  6.  Pt will tolerate manual therapy, therapeutic modalities, and exercises to decrease pain in bilat hands to a reported 3/10 pain or less with daily activities.   Baseline: Eval: 3-4/10 pain in bilat hands at rest, 7-8/10 pain with activity; 06/12/24: pain can range from 0-6 at  rest, and 7-8 with activity, but typically improves with heat, soft tissue massage, occasional use of compression gloves, and activity modification Goal status: in progress  7.  Pt will tolerate and implement edema management strategies to reduce edema in bilat hands to allow doffing/donning wedding ring. Baseline: Pt reports he's been unable to doff wedding ring for ~2 months, but previously donned/doffed ring daily; L RF PIP joint circumference 7.1 cm, R 6.9 cm; 06/12/24: L RF PIP: 6.9 cm; pt is able to don/doff wedding ring and does plan to get ring re-sized for improved comfort/fit of ring on this finger Goal status: achieved    ASSESSMENT:  CLINICAL IMPRESSION: Pt continues to tolerate manual therapy and passive stretching well this date, noting improvements with composite fisting in bilat hands and no increased pain. Pt reports good tolerance to putty upgrade with red putty, with no increases in pain, and feels he continues to make improvements in finger flexibility and strength.  Pt will continue to benefit from skilled OT to address pain, stiffness, and weakness in bilat hands in order to continue to improve indep and tolerance to ADL/IADL tasks.    PERFORMANCE DEFICITS: in functional skills including ADLs, IADLs, coordination, dexterity, edema, ROM, strength, pain, flexibility, Fine motor control, body mechanics, decreased knowledge of precautions, decreased knowledge of use of DME, and UE functional use, and psychosocial skills including coping strategies, environmental adaptation, habits, and routines and behaviors.   IMPAIRMENTS: are limiting patient from ADLs, IADLs, and leisure.   COMORBIDITIES: has co-morbidities such as HTN, Afib, hx of Nstemi, HLD that affects occupational performance. Patient will benefit from skilled OT to address above impairments and improve overall function.  MODIFICATION OR ASSISTANCE TO COMPLETE EVALUATION: No modification of tasks or assist necessary to  complete an evaluation.  OT OCCUPATIONAL PROFILE AND HISTORY: Detailed assessment: Review of records and additional review of physical, cognitive, psychosocial history related to current functional performance.  CLINICAL DECISION MAKING: Moderate - several treatment options, min-mod task modification necessary  REHAB POTENTIAL: Good  EVALUATION COMPLEXITY: Moderate    PLAN:  OT FREQUENCY: 2x/week  OT DURATION: 12 weeks  PLANNED INTERVENTIONS: 97168 OT Re-evaluation, 97535 self care/ADL training, 02889 therapeutic exercise, 97530 therapeutic activity, 97140 manual therapy, 97018 paraffin, 02989 moist heat, 97010 cryotherapy, 97034 contrast bath, 97760 Orthotic Initial, 97763 Orthotic/Prosthetic subsequent, passive range of motion, coping strategies training, patient/family education, and DME and/or AE instructions  RECOMMENDED OTHER SERVICES: None at this time  CONSULTED AND AGREED WITH PLAN OF CARE: Patient  PLAN FOR NEXT SESSION: see above  Inocente Blazing, MS, OTR/L   Inocente MARLA Blazing, OT 06/29/2024, 6:29 PM

## 2024-06-30 ENCOUNTER — Ambulatory Visit

## 2024-07-01 ENCOUNTER — Ambulatory Visit

## 2024-07-01 DIAGNOSIS — M6281 Muscle weakness (generalized): Secondary | ICD-10-CM | POA: Diagnosis not present

## 2024-07-01 DIAGNOSIS — M19042 Primary osteoarthritis, left hand: Secondary | ICD-10-CM

## 2024-07-01 DIAGNOSIS — R278 Other lack of coordination: Secondary | ICD-10-CM

## 2024-07-01 NOTE — Therapy (Signed)
 OUTPATIENT OCCUPATIONAL THERAPY ORTHO TREATMENT NOTE   Patient Name: Gary Mueller MRN: [id number] DOB:12/07/1941, 83 y.o., male Today's Date: 07/01/2024  PCP: Dr. Ophelia Sage ([facility]) REFERRING PROVIDER: Tobie Lady POUR, MD (Rheumatology)  END OF SESSION:   OT End of Session - 07/01/24 1628     Visit Number 16    Number of Visits 24    Date for OT Re-Evaluation 08/05/24    OT Start Time 1520    OT Stop Time 1605    OT Time Calculation (min) 45 min    Activity Tolerance Patient tolerated treatment well    Behavior During Therapy [facility] for tasks assessed/performed         Past Medical History:  Diagnosis Date   Allergy    Anxiety    Arthritis    Cancer (HCC) 10/20/2016   Basal Cell Skin Cancer; lip, neck   Cystoid macular edema of left eye 07/07/2019   Depression    Dysrhythmia    A FIB   GERD (gastroesophageal reflux disease)    HOH (hard of hearing)    Bilateral   Hyperlipidemia    Hypertension    Patient denies   Hypothyroidism    IBS (irritable bowel syndrome)    Inguinal hernia    bilateral   Pneumonia    Thyroid  disease    Tinnitus of both ears    Past Surgical History:  Procedure Laterality Date   ANTERIOR VITRECTOMY Left 11/28/2018   Procedure: ANTERIOR VITRECTOMY;  Surgeon: Ferol Rogue, MD;  Location: [facility] ORS;  Service: Ophthalmology;  Laterality: Left;   CARDIAC CATHETERIZATION     CATARACT EXTRACTION W/PHACO Right 10/10/2018   Procedure: CATARACT EXTRACTION PHACO AND INTRAOCULAR LENS PLACEMENT (IOC);  Surgeon: Ferol Rogue, MD;  Location: [facility] OR[facility];  Service: Ophthalmology;  Laterality: Right;  US  00:41 CDE 6.87 Fluid pack lot # [id number]   CATARACT EXTRACTION W/PHACO Left 11/28/2018   Procedure: CATARACT EXTRACTION PHACO AND INTRAOCULAR LENS PLACEMENT (IOC)-LEFT;  Surgeon: Ferol Rogue, MD;  Location: [facility] OR[facility];  Service: Ophthalmology;  Laterality: Left;  US  00:47.7 CDE 8.92 Fluid Pack lot # [id number]   CHOLECYSTECTOMY  1968   CHONDROPLASTY  Right 01/02/2018   Procedure: CHONDROPLASTY;  Surgeon: Mardee Lynwood SQUIBB, MD;  Location: [facility];  Service: Orthopedics;  Laterality: Right;   COLONOSCOPY  2014   CORONARY STENT INTERVENTION N/A 10/19/2021   Procedure: CORONARY STENT INTERVENTION;  Surgeon: Ammon Blunt, MD;  Location: [facility];  Service: Cardiovascular;  Laterality: N/A;   EYE SURGERY     HERNIA REPAIR Left 04/18/1996   inguinal hernia repair/ Dr Dessa   HERNIA REPAIR Right 02/26/1996   Dr Dessa   HERNIA REPAIR Left 08/07/2002   Dr Dessa   JOINT REPLACEMENT     KNEE ARTHROPLASTY Right 04/30/2020   Procedure: COMPUTER ASSISTED TOTAL KNEE ARTHROPLASTY;  Surgeon: Mardee Lynwood SQUIBB, MD;  Location: [facility];  Service: Orthopedics;  Laterality: Right;   KNEE ARTHROSCOPY Right 01/02/2018   Procedure: ARTHROSCOPY KNEE;  Surgeon: Mardee Lynwood SQUIBB, MD;  Location: [facility];  Service: Orthopedics;  Laterality: Right;   KNEE ARTHROSCOPY Left 02/14/2012   partial menisectomy and chondroplasty   KNEE ARTHROSCOPY WITH MEDIAL MENISECTOMY Right 01/02/2018   Procedure: KNEE ARTHROSCOPY WITH MEDIAL MENISECTOMY;  Surgeon: Mardee Lynwood SQUIBB, MD;  Location: [facility];  Service: Orthopedics;  Laterality: Right;   LEFT HEART CATH AND CORONARY ANGIOGRAPHY N/A 10/19/2021   Procedure: LEFT HEART CATH AND CORONARY ANGIOGRAPHY;  Surgeon: Ammon Blunt,  MD;  Location: [facility];  Service: Cardiovascular;  Laterality: N/A;   LEFT HEART CATH AND CORONARY ANGIOGRAPHY Left 10/10/2022   Procedure: LEFT HEART CATH AND CORONARY ANGIOGRAPHY;  Surgeon: Ammon Blunt, MD;  Location: [facility];  Service: Cardiovascular;  Laterality: Left;   TONSILLECTOMY     as a child   TOTAL KNEE ARTHROPLASTY Left 03/29/2015   [facility] Dr. Mardee   VASECTOMY     Patient Active Problem List   Diagnosis Date Noted   Encounter for fitting and adjustment of hearing aid 04/04/2022   Sensorineural hearing loss, bilateral 04/04/2022    Atrial fibrillation (HCC) 04/04/2022   Hypertension 04/04/2022   Gastroesophageal reflux disease without esophagitis    Rheumatoid arthritis (HCC)    NSTEMI (non-ST elevated myocardial infarction) (HCC) 10/18/2021   Total knee replacement status 04/30/2020   Primary osteoarthritis of right knee 03/28/2020   CME (cystoid macular edema), left 07/07/2019   Nonexudative age-related macular degeneration, bilateral, intermediate dry stage 07/07/2019   SOB (shortness of breath) on exertion 08/22/2017   Chest pain with high risk for cardiac etiology 08/22/2017   Recurrent left inguinal hernia 01/04/2017   Allergic rhinitis 06/17/2015   CAD in native artery 06/17/2015   A-fib (HCC) 06/17/2015   Gonalgia 06/17/2015   Polypharmacy 06/17/2015   Cold sore 06/17/2015   Difficulty hearing 06/17/2015   HLD (hyperlipidemia) 06/17/2015   Essential hypertension 06/17/2015   Hypothyroidism 06/17/2015   Meniere's disease 06/17/2015   Buzzing in ear 06/17/2015   Beat, premature ventricular 06/17/2015   Status post total left knee replacement 04/13/2015   ONSET DATE: Beginning ~4-5 years ago, but worsening over the last 1-1.5 years  REFERRING DIAG: M19.041,M19.042 (ICD-10-CM) - Primary osteoarthritis of both hands   THERAPY DIAG:  Muscle weakness (generalized)  Other lack of coordination  Primary osteoarthritis of both hands  Rationale for Evaluation and Treatment: Rehabilitation  SUBJECTIVE:  SUBJECTIVE STATEMENT: Pt reports he had another flare up of severe pain last night in each hand, but pain has improved from last night. Pt accompanied by: self  PERTINENT HISTORY: Per medical record from rheumatologist appt on 05/05/24:  Subjective:HPI  Gary Mueller is a 83 y.o. male is here today for follow up of inflammation arthritis, osteoarthritis. The patient's allergies, current medications, past family history, past medical history, past social history, past surgical history and problem list  were reviewed and updated as appropriate.   He is having pain of the left 2nd and 4th PIP joints. He is not able to form a fist. He has poor grip. He does take Tylenol  for pains.  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  -- Continue to monitor of the methotrexate  -- Previous cortisone injection has helped the hand joints  -- Can take Tylenol  for pains as needed  -- Refer to OT for further evaluation  Return in about 6 months (around 11/05/2024) for Routine Follow Up.   PRECAUTIONS: Other: maintain joint protection strategies/cumulative trauma prevention  RED FLAGS: None   WEIGHT BEARING RESTRICTIONS: No  PAIN: 07/01/24: 5/10 in bilat hands Are you having pain? Yes: NPRS scale: 3-4 in bilat hands at rest, 7-8 with activity  Pain location: bilat hands, but increased  tenderness to touch in the L 4th finger and thumb, R 5th digit (burning)   Pain description: sharp in both hands, burning in the R 5th digit and L thumb CMC  Aggravating factors: activity Relieving factors: elevating, rest, tylenol , heat   FALLS: Has patient fallen in last 6 months? No  LIVING ENVIRONMENT: Lives with: lives with their family, lives with adult son   PLOF: Retired from owning a Occupational [employer]; Currently plays golf a couple days per week (must use built up golf grips and has modified his grip), exercises at the [employer] at [facility] 3-4 times a week, walks dog 2-3x per week.    PATIENT GOALS: I'd like to be able to use them better, get more movement in my hands.   NEXT MD VISIT: Return to Dr. Tobie in ~6 months, per chart (Rheumatology)  OBJECTIVE:  Note: Objective measures were completed at Evaluation unless  otherwise noted.  HAND DOMINANCE: Right  ADLs: Eating: indep; pt reports no difficulties with cutting food or manipulating eating utensils Grooming: indep Upper body dressing: difficulty managing small buttons on a dress shirt; extra time needed Lower body dressing: pt tends to buy pants without buttons d/t difficulty with fasteners  Toileting: indep Bathing: pt reports that he frequently drops his soap d/t inability to fully close either hand Tub shower transfers: pt does have grab bars in shower, but reports he is still able to grasp bars without difficulty  Equipment: jar opener for kitchen  FUNCTIONAL OUTCOME MEASURES: MAM-20 for musculoskeletal conditions: TBD next session 05/15/24: 60/80 06/12/24: 66/80  UPPER EXTREMITY ROM:     Active ROM Right eval Right 06/12/24 Left eval Left 06/12/24  Thumb MCP (0-60)      Thumb IP (0-80)      Thumb Radial abd/add (0-55)       Thumb Palmar abd/add (0-45)       Thumb Opposition to Small Finger       Index MCP (0-90) 80* flexion 85  78* flexion 80  Index PIP (0-100)       Index DIP (0-70)        Long MCP (0-90)        Long PIP (0-100) 20* flexion  60     Long DIP (0-70) 35* of radial deviation (passively -25* from neutral)  30* of radial deviation (passively -10* from neutral)     Ring MCP (0-90)        Ring PIP (0-100)        Ring DIP (0-70)        Little MCP (0-90)        Little PIP (0-100)        Little DIP (0-70)        (Blank rows = not tested)  Eval: Tip to palm: R LF lacking 3 cm; 06/12/24: R LF lacking 1.5 cm Eval: Tip to palm: L IF lacking 2.5 cm; 06/12/24: L IF lacking 2.0 cm Eval: Tip to palm: L RF lacking 1.5 cm; 06/12/24: L RF able to fully touch tip to palm      Eval: Able to oppose all digits to thumb on each hand, but thumb touches medial aspect (not tip) of 5th digit DIP on each hand; 06/12/24: No change   UPPER EXTREMITY MMT:     MMT Right eval Left eval  Shoulder flexion    Shoulder abduction    Shoulder  adduction    Shoulder extension    Shoulder internal rotation  Shoulder external rotation    Middle trapezius    Lower trapezius    Elbow flexion    Elbow extension    Wrist flexion 5 5  Wrist extension 5 5  Wrist ulnar deviation    Wrist radial deviation    Wrist pronation    Wrist supination    (Blank rows = not tested)  HAND FUNCTION: Grip strength: Right: 35 lbs; Left: 28 lbs, Lateral pinch: Right: 13 lbs, Left: 11 lbs, and 3 point pinch: Right: 9 lbs, Left: 8 lbs 06/12/24: Grip Right: 51 lbs; Left: 46 lbs, lateral pinch: Right: 14 lbs, Left: 11 lbs, and 3 point pinch: Right: 12 lbs, Left: 12 lbs   COORDINATION: 9 Hole Peg test: Right: 30 sec; Left: 30 sec 06/12/24: Right: 28 sec; Left: 27 sec   SENSATION: WFL  EDEMA:    L RF PIP circumference: 7.1 cm (Pt unable to doff wedding ring from L RF for 2 months now d/t increased swelling in digits); R RF PIP circumference: 6.9 cm 06/12/24: L RF PIP circumference: 6.9 cm  COGNITION: Overall cognitive status: Within functional limits for tasks assessed  OBSERVATIONS:  Pt pleasant/cooperative, and appears eager to improve functional use of hands with trial of OT.  TREATMENT DATE: 07/01/24 Moist heat used intermittently throughout session to manage pain and promote muscle relaxation for improving digit ROM in bilat hands.  Therapeutic Exercise: -Gentle passive stretching to digits 1-5 in each hand to promote isolated flex/ext at the MP, PIP, and DIP joints (joint blocking) for improving grasp/release; increased focus on L IF and R LF with gentle joint distraction during flexion at each joint for increased range -Reps of R/L active assisted composite fisting/digit ext for increasing range tip to palm for holding/storing ADL supplies in either hand. -Gentle passive stretching at the R LF DIP to reduce radial deviation -Bilat grip strengthening with hand gripper set 23.4# on the L for 2 trials, 23.4# on the R for 1 trial, increasing  to 28.9# for a 2nd trial on the R.  Alternated between R/L hands for each set and provided constant monitoring for tolerance to activity to ensure no increases in pain.   PATIENT EDUCATION: Education details: joint protection; reviewed hand positioning when reading Person educated: Patient Education method: Explanation Education comprehension: verbalized understanding  HOME EXERCISE PROGRAM: Yellow/red theraputty; edema massage to digits in either hand  GOALS: Goals reviewed with patient? Yes  SHORT TERM GOALS: Target date: 06/24/24  Pt will be indep to perform HEP for improving bilat hand flexibility, strength, and coordination. Baseline: Eval: HEP not yet initiated; 06/12/24: Indep and consistent to perform HEP daily Goal status: achieved  2.  Pt will be indep to verbalize 2-3 joint protection/cumulative trauma prevention strategies to reduce pain in R/L hands.  Baseline: Eval: Education not yet initiated; 06/12/24: indep to implement strategies reviewed in previous sessions Goal status: achieved  3.  Pt will be indep to verbalize and implement 1-2 pain management strategies to reduce pain in bilat hands.  Baseline: Eval: Education not yet initiated; 06/12/24: Pt has started taking tylenol  prn/per recommendation from rheumatologist.  Pt uses heat regularly in the home and performs gentle stretching to reduce pain/stiffness in joints of hands.  Goal status: achieved  4.  Pt will be indep to monitor skin integrity of R LF while donning/doffing finger splint in order to reduce risk of skin breakdown. Baseline: Eval: Splint not yet issued/education not yet initiated; 06/12/24: using Ktape in place of splint; pt has  had no skin breakdown from regular use of Ktape and is indep to monitor for any adverse reactions Goal status: achieved  LONG TERM GOALS: Target date: 08/05/24  Pt will increase MAM-20 (for musculoskeletal conditions) score by 9 or more points in order to improve pt's perceived  functional use of bilat hands with daily tasks. Baseline: Eval: MAM-20 to be given next session; 05/15/24: 60/80; 06/12/24: 66/80 Goal status: in progress  2.  Pt will increase bilat grip strength by 10 or more lbs in each hand to ease ability to open containers. Baseline: Eval: R 35 lbs, L 28 lbs (below age range norms); 06/12/24: R 51 lbs; Left: 46 lbs Goal status: achieved/exceeded  3.  Pt will increase lateral pinch strength by 3 lbs or more in each hand to ease ability to open food packages.  Baseline: Eval: R 13 lbs, L 11 lbs; 06/12/24: R 14 lbs, Left: 11 lbs Goal status: in progress  4.  Pt will increase bilat hand flexibility to enable ability to formulate a fully closed fist to reduce frequency of dropping smaller ADL supplies in either hand. Baseline: Eval: (Tip to palm) R LF lacking 3cm, L IF lacking 2.5 cm, L RF lacking 1.5 cm; 06/12/24: R LF lacking 1.5 cm, L IF lacking 2.0 cm, L RF able to full touch tip to palm Goal status: in progress  5.  Pt will follow recommended wearing schedule of Ktape with at least 75% adherence to reduce radial deviation in R LF DIP joint and reduce  risk of worsening hand deformity in R dominant hand. Baseline: Eval: Splint not yet issued; Pt presents with 35* of radial deviation on R LF DIP joint (able to passively reduce to 25*); 06/12/24: using Ktape regularly in place of oval 8; now 30* of radial deviation (passively -10* from neutral)  Goal status: in progress/revised (oval 8 not used; transitioned to Ktape reduce risk of skin breakdown from splint)  6.  Pt will tolerate manual therapy, therapeutic modalities, and exercises to decrease pain in bilat hands to a reported 3/10 pain or less with daily activities.   Baseline: Eval: 3-4/10 pain in bilat hands at rest, 7-8/10 pain with activity; 06/12/24: pain can range from 0-6 at rest, and 7-8 with activity, but typically improves with heat, soft tissue massage, occasional use of compression gloves, and  activity modification Goal status: in progress  7.  Pt will tolerate and implement edema management strategies to reduce edema in bilat hands to allow doffing/donning wedding ring. Baseline: Pt reports he's been unable to doff wedding ring for ~2 months, but previously donned/doffed ring daily; L RF PIP joint circumference 7.1 cm, R 6.9 cm; 06/12/24: L RF PIP: 6.9 cm; pt is able to don/doff wedding ring and does plan to get ring re-sized for improved comfort/fit of ring on this finger Goal status: achieved    ASSESSMENT:  CLINICAL IMPRESSION: Pt continues to tolerate manual therapy and passive stretching well this date.  Noted increased stiffness with R passive composite fisting this date.  Pt reported that he thinks he was grasping his Kindle too long yesterday.  Pt verbalized understanding of recommended positioning to avoid prolonged pinching.  Pt acknowledged and reported that he transitioned to laying book on his lap.  Pt continues to reports improvements in grip strength.  Able to increase hand gripper today on the R hand to 28.9# for 2nd set for removing jumbo pegs from pegboard.  No increased pain with this activity.  Ktape applied end  of session to lateral sides of R LF to reduce DIP radial deviation.  Pt will continue to benefit from skilled OT to address pain, stiffness, and weakness in bilat hands in order to continue to improve indep and tolerance to ADL/IADL tasks.    PERFORMANCE DEFICITS: in functional skills including ADLs, IADLs, coordination, dexterity, edema, ROM, strength, pain, flexibility, Fine motor control, body mechanics, decreased knowledge of precautions, decreased knowledge of use of DME, and UE functional use, and psychosocial skills including coping strategies, environmental adaptation, habits, and routines and behaviors.   IMPAIRMENTS: are limiting patient from ADLs, IADLs, and leisure.   COMORBIDITIES: has co-morbidities such as HTN, Afib, hx of Nstemi, HLD that affects  occupational performance. Patient will benefit from skilled OT to address above impairments and improve overall function.  MODIFICATION OR ASSISTANCE TO COMPLETE EVALUATION: No modification of tasks or assist necessary to complete an evaluation.  OT OCCUPATIONAL PROFILE AND HISTORY: Detailed assessment: Review of records and additional review of physical, cognitive, psychosocial history related to current functional performance.  CLINICAL DECISION MAKING: Moderate - several treatment options, min-mod task modification necessary  REHAB POTENTIAL: Good  EVALUATION COMPLEXITY: Moderate    PLAN:  OT FREQUENCY: 2x/week  OT DURATION: 12 weeks  PLANNED INTERVENTIONS: 97168 OT Re-evaluation, 97535 self care/ADL training, 02889 therapeutic exercise, 97530 therapeutic activity, 97140 manual therapy, 97018 paraffin, 02989 moist heat, 97010 cryotherapy, 97034 contrast bath, 97760 Orthotic Initial, 97763 Orthotic/Prosthetic subsequent, passive range of motion, coping strategies training, patient/family education, and DME and/or AE instructions  RECOMMENDED OTHER SERVICES: None at this time  CONSULTED AND AGREED WITH PLAN OF CARE: Patient  PLAN FOR NEXT SESSION: see above  Inocente Blazing, MS, OTR/L   Inocente MARLA Blazing, OT 07/01/2024, 4:30 PM

## 2024-07-07 ENCOUNTER — Ambulatory Visit

## 2024-07-07 DIAGNOSIS — R278 Other lack of coordination: Secondary | ICD-10-CM

## 2024-07-07 DIAGNOSIS — M6281 Muscle weakness (generalized): Secondary | ICD-10-CM

## 2024-07-07 DIAGNOSIS — M19041 Primary osteoarthritis, right hand: Secondary | ICD-10-CM

## 2024-07-07 NOTE — Therapy (Signed)
 OUTPATIENT OCCUPATIONAL THERAPY ORTHO TREATMENT NOTE   Patient Name: Gary Mueller MRN: [id number] DOB:[birth year], 83 y.o., male Today's Date: 07/07/2024  PCP: Dr. Ophelia Mueller ([facility]) REFERRING PROVIDER: Tobie Lady POUR, MD (Rheumatology)  END OF SESSION:   OT End of Session - 07/07/24 1015     Visit Number 17    Number of Visits 24    Date for OT Re-Evaluation 08/05/24    OT Start Time 1015    OT Stop Time 1100    OT Time Calculation (min) 45 min    Activity Tolerance Patient tolerated treatment well    Behavior During Therapy [facility] for tasks assessed/performed         Past Medical History:  Diagnosis Date   Allergy    Anxiety    Arthritis    Cancer (HCC) 10/20/2016   Basal Cell Skin Cancer; lip, neck   Cystoid macular edema of left eye 07/07/2019   Depression    Dysrhythmia    A FIB   GERD (gastroesophageal reflux disease)    HOH (hard of hearing)    Bilateral   Hyperlipidemia    Hypertension    Patient denies   Hypothyroidism    IBS (irritable bowel syndrome)    Inguinal hernia    bilateral   Pneumonia    Thyroid  disease    Tinnitus of both ears    Past Surgical History:  Procedure Laterality Date   ANTERIOR VITRECTOMY Left 11/28/2018   Procedure: ANTERIOR VITRECTOMY;  Surgeon: Gary Rogue, MD;  Location: [facility];  Service: Ophthalmology;  Laterality: Left;   CARDIAC CATHETERIZATION     CATARACT EXTRACTION W/PHACO Right 10/10/2018   Procedure: CATARACT EXTRACTION PHACO AND INTRAOCULAR LENS PLACEMENT (IOC);  Surgeon: Gary Rogue, MD;  Location: [facility];  Service: Ophthalmology;  Laterality: Right;  US  00:41 CDE 6.87 Fluid pack lot # [id number]   CATARACT EXTRACTION W/PHACO Left 11/28/2018   Procedure: CATARACT EXTRACTION PHACO AND INTRAOCULAR LENS PLACEMENT (IOC)-LEFT;  Surgeon: Gary Rogue, MD;  Location: [facility];  Service: Ophthalmology;  Laterality: Left;  US  00:47.7 CDE 8.92 Fluid Pack lot # [id number]   CHOLECYSTECTOMY  1968   CHONDROPLASTY  Right 01/02/2018   Procedure: CHONDROPLASTY;  Surgeon: Gary Lynwood SQUIBB, MD;  Location: [facility];  Service: Orthopedics;  Laterality: Right;   COLONOSCOPY  2014   CORONARY STENT INTERVENTION N/A 10/19/2021   Procedure: CORONARY STENT INTERVENTION;  Surgeon: Gary Blunt, MD;  Location: [facility];  Service: Cardiovascular;  Laterality: N/A;   EYE SURGERY     HERNIA REPAIR Left 04/18/1996   inguinal hernia repair/ Dr Gary Mueller   HERNIA REPAIR Right 02/26/1996   Dr Gary Mueller   HERNIA REPAIR Left 08/07/2002   Dr Gary Mueller   JOINT REPLACEMENT     KNEE ARTHROPLASTY Right 04/30/2020   Procedure: COMPUTER ASSISTED TOTAL KNEE ARTHROPLASTY;  Surgeon: Gary Lynwood SQUIBB, MD;  Location: [facility];  Service: Orthopedics;  Laterality: Right;   KNEE ARTHROSCOPY Right 01/02/2018   Procedure: ARTHROSCOPY KNEE;  Surgeon: Gary Lynwood SQUIBB, MD;  Location: [facility];  Service: Orthopedics;  Laterality: Right;   KNEE ARTHROSCOPY Left 02/14/2012   partial menisectomy and chondroplasty   KNEE ARTHROSCOPY WITH MEDIAL MENISECTOMY Right 01/02/2018   Procedure: KNEE ARTHROSCOPY WITH MEDIAL MENISECTOMY;  Surgeon: Gary Lynwood SQUIBB, MD;  Location: [facility];  Service: Orthopedics;  Laterality: Right;   LEFT HEART CATH AND CORONARY ANGIOGRAPHY N/A 10/19/2021   Procedure: LEFT HEART CATH AND CORONARY ANGIOGRAPHY;  Surgeon: Gary Blunt,  MD;  Location: [facility];  Service: Cardiovascular;  Laterality: N/A;   LEFT HEART CATH AND CORONARY ANGIOGRAPHY Left 10/10/2022   Procedure: LEFT HEART CATH AND CORONARY ANGIOGRAPHY;  Surgeon: Gary Blunt, MD;  Location: [facility];  Service: Cardiovascular;  Laterality: Left;   TONSILLECTOMY     as a child   TOTAL KNEE ARTHROPLASTY Left 03/29/2015   [facility] Dr. Mardee   VASECTOMY     Patient Active Problem List   Diagnosis Date Noted   Encounter for fitting and adjustment of hearing aid 04/04/2022   Sensorineural hearing loss, bilateral 04/04/2022    Atrial fibrillation (HCC) 04/04/2022   Hypertension 04/04/2022   Gastroesophageal reflux disease without esophagitis    Rheumatoid arthritis (HCC)    NSTEMI (non-ST elevated myocardial infarction) (HCC) 10/18/2021   Total knee replacement status 04/30/2020   Primary osteoarthritis of right knee 03/28/2020   CME (cystoid macular edema), left 07/07/2019   Nonexudative age-related macular degeneration, bilateral, intermediate dry stage 07/07/2019   SOB (shortness of breath) on exertion 08/22/2017   Chest pain with high risk for cardiac etiology 08/22/2017   Recurrent left inguinal hernia 01/04/2017   Allergic rhinitis 06/17/2015   CAD in native artery 06/17/2015   A-fib (HCC) 06/17/2015   Gonalgia 06/17/2015   Polypharmacy 06/17/2015   Cold sore 06/17/2015   Difficulty hearing 06/17/2015   HLD (hyperlipidemia) 06/17/2015   Essential hypertension 06/17/2015   Hypothyroidism 06/17/2015   Meniere's disease 06/17/2015   Buzzing in ear 06/17/2015   Beat, premature ventricular 06/17/2015   Status post total left knee replacement 04/13/2015   ONSET DATE: Beginning ~4-5 years ago, but worsening over the last 1-1.5 years  REFERRING DIAG: M19.041,M19.042 (ICD-10-CM) - Primary osteoarthritis of both hands   THERAPY DIAG:  Muscle weakness (generalized)  Other lack of coordination  Primary osteoarthritis of both hands  Rationale for Evaluation and Treatment: Rehabilitation  SUBJECTIVE:  SUBJECTIVE STATEMENT: Pt reports that he started using his Voltaren gel 2x a day applied to the joints of his painful fingers in each hand, and this seems to have further helped with pain management along with tylenol  2x daily. Pt accompanied by: self  PERTINENT HISTORY: Per medical record from rheumatologist appt on 05/05/24:  Subjective:HPI  Gary Mueller is a 83 y.o. male is here today for follow up of inflammation arthritis, osteoarthritis. The patient's allergies, current medications, past family  history, past medical history, past social history, past surgical history and problem list were reviewed and updated as appropriate.   He is having pain of the left 2nd and 4th PIP joints. He is not able to form a fist. He has poor grip. He does take Tylenol  for pains.  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  -- Continue to monitor of the methotrexate  -- Previous cortisone injection has helped the hand joints  -- Can take Tylenol  for pains as needed  -- Refer to OT for further evaluation  Return in about 6 months (around 11/05/2024) for Routine Follow Up.   PRECAUTIONS: Other: maintain joint protection strategies/cumulative trauma prevention  RED FLAGS: None   WEIGHT BEARING RESTRICTIONS: No  PAIN: 07/07/24: 4-5/10 in bilat hands Are you having pain? Yes: NPRS scale:  3-4 in bilat hands at rest, 7-8 with activity  Pain location: bilat hands, but increased tenderness to touch in the L 4th finger and thumb, R 5th digit (burning)   Pain description: sharp in both hands, burning in the R 5th digit and L thumb CMC  Aggravating factors: activity Relieving factors: elevating, rest, tylenol , heat   FALLS: Has patient fallen in last 6 months? No  LIVING ENVIRONMENT: Lives with: lives with their family, lives with adult son   PLOF: Retired from owning a Occupational psychologist; Currently plays golf a couple days per week (must use built up golf grips and has modified his grip), exercises at the [employer] at [facility] 3-4 times a week, walks dog 2-3x per week.    PATIENT GOALS: I'd like to be able to use them better, get more movement in my hands.   NEXT MD VISIT: Return to Dr. Tobie in ~6 months, per  chart (Rheumatology)  OBJECTIVE:  Note: Objective measures were completed at Evaluation unless otherwise noted.  HAND DOMINANCE: Right  ADLs: Eating: indep; pt reports no difficulties with cutting food or manipulating eating utensils Grooming: indep Upper body dressing: difficulty managing small buttons on a dress shirt; extra time needed Lower body dressing: pt tends to buy pants without buttons d/t difficulty with fasteners  Toileting: indep Bathing: pt reports that he frequently drops his soap d/t inability to fully close either hand Tub shower transfers: pt does have grab bars in shower, but reports he is still able to grasp bars without difficulty  Equipment: jar opener for kitchen  FUNCTIONAL OUTCOME MEASURES: MAM-20 for musculoskeletal conditions: TBD next session 05/15/24: 60/80 06/12/24: 66/80  UPPER EXTREMITY ROM:     Active ROM Right eval Right 06/12/24 Left eval Left 06/12/24  Thumb MCP (0-60)      Thumb IP (0-80)      Thumb Radial abd/add (0-55)       Thumb Palmar abd/add (0-45)       Thumb Opposition to Small Finger       Index MCP (0-90) 80* flexion 85  78* flexion 80  Index PIP (0-100)       Index DIP (0-70)        Long MCP (0-90)        Long PIP (0-100) 20* flexion  60     Long DIP (0-70) 35* of radial deviation (passively -25* from neutral)  30* of radial deviation (passively -10* from neutral)     Ring MCP (0-90)        Ring PIP (0-100)        Ring DIP (0-70)        Little MCP (0-90)        Little PIP (0-100)        Little DIP (0-70)        (Blank rows = not tested)  Eval: Tip to palm: R LF lacking 3 cm; 06/12/24: R LF lacking 1.5 cm Eval: Tip to palm: L IF lacking 2.5 cm; 06/12/24: L IF lacking 2.0 cm Eval: Tip to palm: L RF lacking 1.5 cm; 06/12/24: L RF able to fully touch tip to palm      Eval: Able to oppose all digits to thumb on each hand, but thumb touches medial aspect (not tip) of 5th digit DIP on each hand; 06/12/24: No change   UPPER  EXTREMITY MMT:     MMT Right eval Left eval  Shoulder flexion    Shoulder abduction  Shoulder adduction    Shoulder extension    Shoulder internal rotation    Shoulder external rotation    Middle trapezius    Lower trapezius    Elbow flexion    Elbow extension    Wrist flexion 5 5  Wrist extension 5 5  Wrist ulnar deviation    Wrist radial deviation    Wrist pronation    Wrist supination    (Blank rows = not tested)  HAND FUNCTION: Grip strength: Right: 35 lbs; Left: 28 lbs, Lateral pinch: Right: 13 lbs, Left: 11 lbs, and 3 point pinch: Right: 9 lbs, Left: 8 lbs 06/12/24: Grip Right: 51 lbs; Left: 46 lbs, lateral pinch: Right: 14 lbs, Left: 11 lbs, and 3 point pinch: Right: 12 lbs, Left: 12 lbs   COORDINATION: 9 Hole Peg test: Right: 30 sec; Left: 30 sec 06/12/24: Right: 28 sec; Left: 27 sec   SENSATION: WFL  EDEMA:    L RF PIP circumference: 7.1 cm (Pt unable to doff wedding ring from L RF for 2 months now d/t increased swelling in digits); R RF PIP circumference: 6.9 cm 06/12/24: L RF PIP circumference: 6.9 cm  COGNITION: Overall cognitive status: Within functional limits for tasks assessed  OBSERVATIONS:  Pt pleasant/cooperative, and appears eager to improve functional use of hands with trial of OT.  TREATMENT DATE: 07/07/24 Moist heat used intermittently throughout session to manage pain and promote muscle relaxation for improving digit ROM in bilat hands.  Therapeutic Exercise: -Gentle passive stretching to digits 1-5 in each hand to promote isolated flex/ext at the MP, PIP, and DIP joints (joint blocking) for improving grasp/release; increased focus on L IF and R LF with gentle joint distraction during flexion at each joint for increased range -Reps of R/L active assisted composite fisting/digit ext for increasing range tip to palm for holding/storing ADL supplies in either hand. -Gentle passive stretching at the R LF DIP to reduce radial deviation  Ktape:  anchoring first end to radial side of base of LF, extending overtop fingertip with ~75% stretch, anchoring second end to ulnar side of base of LF to reduce radial deviation.    PATIENT EDUCATION: Education details: HEP; continue theraputty to maintain strength and limit joint stiffness in bilat hands. Person educated: Patient Education method: Explanation Education comprehension: verbalized understanding  HOME EXERCISE PROGRAM: Yellow/red theraputty; edema massage to digits in either hand  GOALS: Goals reviewed with patient? Yes  SHORT TERM GOALS: Target date: 06/24/24  Pt will be indep to perform HEP for improving bilat hand flexibility, strength, and coordination. Baseline: Eval: HEP not yet initiated; 06/12/24: Indep and consistent to perform HEP daily Goal status: achieved  2.  Pt will be indep to verbalize 2-3 joint protection/cumulative trauma prevention strategies to reduce pain in R/L hands.  Baseline: Eval: Education not yet initiated; 06/12/24: indep to implement strategies reviewed in previous sessions Goal status: achieved  3.  Pt will be indep to verbalize and implement 1-2 pain management strategies to reduce pain in bilat hands.  Baseline: Eval: Education not yet initiated; 06/12/24: Pt has started taking tylenol  prn/per recommendation from rheumatologist.  Pt uses heat regularly in the home and performs gentle stretching to reduce pain/stiffness in joints of hands.  Goal status: achieved  4.  Pt will be indep to monitor skin integrity of R LF while donning/doffing finger splint in order to reduce risk of skin breakdown. Baseline: Eval: Splint not yet issued/education not yet initiated; 06/12/24: using Ktape in place of splint;  pt has had no skin breakdown from regular use of Ktape and is indep to monitor for any adverse reactions Goal status: achieved  LONG TERM GOALS: Target date: 08/05/24  Pt will increase MAM-20 (for musculoskeletal conditions) score by 9 or more points  in order to improve pt's perceived functional use of bilat hands with daily tasks. Baseline: Eval: MAM-20 to be given next session; 05/15/24: 60/80; 06/12/24: 66/80 Goal status: in progress  2.  Pt will increase bilat grip strength by 10 or more lbs in each hand to ease ability to open containers. Baseline: Eval: R 35 lbs, L 28 lbs (below age range norms); 06/12/24: R 51 lbs; Left: 46 lbs Goal status: achieved/exceeded  3.  Pt will increase lateral pinch strength by 3 lbs or more in each hand to ease ability to open food packages.  Baseline: Eval: R 13 lbs, L 11 lbs; 06/12/24: R 14 lbs, Left: 11 lbs Goal status: in progress  4.  Pt will increase bilat hand flexibility to enable ability to formulate a fully closed fist to reduce frequency of dropping smaller ADL supplies in either hand. Baseline: Eval: (Tip to palm) R LF lacking 3cm, L IF lacking 2.5 cm, L RF lacking 1.5 cm; 06/12/24: R LF lacking 1.5 cm, L IF lacking 2.0 cm, L RF able to full touch tip to palm Goal status: in progress  5.  Pt will follow recommended wearing schedule of Ktape with at least 75% adherence to reduce radial deviation in R LF DIP joint and reduce  risk of worsening hand deformity in R dominant hand. Baseline: Eval: Splint not yet issued; Pt presents with 35* of radial deviation on R LF DIP joint (able to passively reduce to 25*); 06/12/24: using Ktape regularly in place of oval 8; now 30* of radial deviation (passively -10* from neutral)  Goal status: in progress/revised (oval 8 not used; transitioned to Ktape reduce risk of skin breakdown from splint)  6.  Pt will tolerate manual therapy, therapeutic modalities, and exercises to decrease pain in bilat hands to a reported 3/10 pain or less with daily activities.   Baseline: Eval: 3-4/10 pain in bilat hands at rest, 7-8/10 pain with activity; 06/12/24: pain can range from 0-6 at rest, and 7-8 with activity, but typically improves with heat, soft tissue massage, occasional  use of compression gloves, and activity modification Goal status: in progress  7.  Pt will tolerate and implement edema management strategies to reduce edema in bilat hands to allow doffing/donning wedding ring. Baseline: Pt reports he's been unable to doff wedding ring for ~2 months, but previously donned/doffed ring daily; L RF PIP joint circumference 7.1 cm, R 6.9 cm; 06/12/24: L RF PIP: 6.9 cm; pt is able to don/doff wedding ring and does plan to get ring re-sized for improved comfort/fit of ring on this finger Goal status: achieved    ASSESSMENT: CLINICAL IMPRESSION: Pt finding improved pain management with Voltaren gel in addition to Tylenol  2x daily.  Good tolerance to manual therapy and passive stretching this date, working towards improved composite fisting in bilat hands.  Pt continues to verbalize consistent use of theraputty several times per day to maintain strength and reduce stiffness in bilat hands.  Pt will continue to benefit from skilled OT to address pain, stiffness, and weakness in bilat hands in order to continue to improve indep and tolerance to ADL/IADL tasks.    PERFORMANCE DEFICITS: in functional skills including ADLs, IADLs, coordination, dexterity, edema, ROM, strength, pain, flexibility,  Fine motor control, body mechanics, decreased knowledge of precautions, decreased knowledge of use of DME, and UE functional use, and psychosocial skills including coping strategies, environmental adaptation, habits, and routines and behaviors.   IMPAIRMENTS: are limiting patient from ADLs, IADLs, and leisure.   COMORBIDITIES: has co-morbidities such as HTN, Afib, hx of Nstemi, HLD that affects occupational performance. Patient will benefit from skilled OT to address above impairments and improve overall function.  MODIFICATION OR ASSISTANCE TO COMPLETE EVALUATION: No modification of tasks or assist necessary to complete an evaluation.  OT OCCUPATIONAL PROFILE AND HISTORY: Detailed  assessment: Review of records and additional review of physical, cognitive, psychosocial history related to current functional performance.  CLINICAL DECISION MAKING: Moderate - several treatment options, min-mod task modification necessary  REHAB POTENTIAL: Good  EVALUATION COMPLEXITY: Moderate    PLAN:  OT FREQUENCY: 2x/week  OT DURATION: 12 weeks  PLANNED INTERVENTIONS: 97168 OT Re-evaluation, 97535 self care/ADL training, 02889 therapeutic exercise, 97530 therapeutic activity, 97140 manual therapy, 97018 paraffin, 02989 moist heat, 97010 cryotherapy, 97034 contrast bath, 97760 Orthotic Initial, 97763 Orthotic/Prosthetic subsequent, passive range of motion, coping strategies training, patient/family education, and DME and/or AE instructions  RECOMMENDED OTHER SERVICES: None at this time  CONSULTED AND AGREED WITH PLAN OF CARE: Patient  PLAN FOR NEXT SESSION: see above  Inocente Blazing, MS, OTR/L   Inocente MARLA Blazing, OT 07/07/2024, 1:01 PM

## 2024-07-09 ENCOUNTER — Ambulatory Visit

## 2024-07-09 DIAGNOSIS — M19041 Primary osteoarthritis, right hand: Secondary | ICD-10-CM

## 2024-07-09 DIAGNOSIS — K922 Gastrointestinal hemorrhage, unspecified: Secondary | ICD-10-CM | POA: Diagnosis not present

## 2024-07-09 DIAGNOSIS — K254 Chronic or unspecified gastric ulcer with hemorrhage: Secondary | ICD-10-CM | POA: Diagnosis not present

## 2024-07-09 DIAGNOSIS — R278 Other lack of coordination: Secondary | ICD-10-CM

## 2024-07-09 DIAGNOSIS — M6281 Muscle weakness (generalized): Secondary | ICD-10-CM

## 2024-07-09 NOTE — Therapy (Signed)
 OUTPATIENT OCCUPATIONAL THERAPY ORTHO DISCHARGE NOTE   Patient Name: Gary Mueller MRN: [id number] DOB:[birth year], 83 y.o., male Today's Date: 07/13/2024  PCP: Dr. Ophelia Sage ([facility]) REFERRING PROVIDER: Tobie Lady POUR, MD (Rheumatology)  END OF SESSION:   OT End of Session - 07/13/24 1154     Visit Number 18    Number of Visits 24    Date for OT Re-Evaluation 08/05/24    OT Start Time 1100    OT Stop Time 1145    OT Time Calculation (min) 45 min    Activity Tolerance Patient tolerated treatment well    Behavior During Therapy [facility] for tasks assessed/performed          Past Medical History:  Diagnosis Date   Allergy    Anxiety    Arthritis    Cancer (HCC) 10/20/2016   Basal Cell Skin Cancer; lip, neck   Cystoid macular edema of left eye 07/07/2019   Depression    Dysrhythmia    A FIB   GERD (gastroesophageal reflux disease)    HOH (hard of hearing)    Bilateral   Hyperlipidemia    Hypertension    Patient denies   Hypothyroidism    IBS (irritable bowel syndrome)    Inguinal hernia    bilateral   Pneumonia    Thyroid  disease    Tinnitus of both ears    Past Surgical History:  Procedure Laterality Date   ANTERIOR VITRECTOMY Left 11/28/2018   Procedure: ANTERIOR VITRECTOMY;  Surgeon: Ferol Rogue, MD;  Location: [facility] ORS;  Service: Ophthalmology;  Laterality: Left;   CARDIAC CATHETERIZATION     CATARACT EXTRACTION W/PHACO Right 10/10/2018   Procedure: CATARACT EXTRACTION PHACO AND INTRAOCULAR LENS PLACEMENT (IOC);  Surgeon: Ferol Rogue, MD;  Location: [facility] ORS;  Service: Ophthalmology;  Laterality: Right;  US  00:41 CDE 6.87 Fluid pack lot # [id number]   CATARACT EXTRACTION W/PHACO Left 11/28/2018   Procedure: CATARACT EXTRACTION PHACO AND INTRAOCULAR LENS PLACEMENT (IOC)-LEFT;  Surgeon: Ferol Rogue, MD;  Location: [facility] ORS;  Service: Ophthalmology;  Laterality: Left;  US  00:47.7 CDE 8.92 Fluid Pack lot # [id number]   CHOLECYSTECTOMY  1968   CHONDROPLASTY  Right 01/02/2018   Procedure: CHONDROPLASTY;  Surgeon: Mardee Lynwood SQUIBB, MD;  Location: [facility] ORS;  Service: Orthopedics;  Laterality: Right;   COLONOSCOPY  2014   CORONARY STENT INTERVENTION N/A 10/19/2021   Procedure: CORONARY STENT INTERVENTION;  Surgeon: Ammon Blunt, MD;  Location: [facility] INVASIVE CV LAB;  Service: Cardiovascular;  Laterality: N/A;   EYE SURGERY     HERNIA REPAIR Left 04/18/1996   inguinal hernia repair/ Dr Dessa   HERNIA REPAIR Right 02/26/1996   Dr Dessa   HERNIA REPAIR Left 08/07/2002   Dr Dessa   JOINT REPLACEMENT     KNEE ARTHROPLASTY Right 04/30/2020   Procedure: COMPUTER ASSISTED TOTAL KNEE ARTHROPLASTY;  Surgeon: Mardee Lynwood SQUIBB, MD;  Location: [facility] OR[facility];  Service: Orthopedics;  Laterality: Right;   KNEE ARTHROSCOPY Right 01/02/2018   Procedure: ARTHROSCOPY KNEE;  Surgeon: Mardee Lynwood SQUIBB, MD;  Location: [facility] ORS;  Service: Orthopedics;  Laterality: Right;   KNEE ARTHROSCOPY Left 02/14/2012   partial menisectomy and chondroplasty   KNEE ARTHROSCOPY WITH MEDIAL MENISECTOMY Right 01/02/2018   Procedure: KNEE ARTHROSCOPY WITH MEDIAL MENISECTOMY;  Surgeon: Mardee Lynwood SQUIBB, MD;  Location: [facility] ORS;  Service: Orthopedics;  Laterality: Right;   LEFT HEART CATH AND CORONARY ANGIOGRAPHY N/A 10/19/2021   Procedure: LEFT HEART CATH AND CORONARY ANGIOGRAPHY;  Surgeon: Ammon,  Marsa, MD;  Location: [facility] INVASIVE CV [facility];  Service: Cardiovascular;  Laterality: N/A;   LEFT HEART CATH AND CORONARY ANGIOGRAPHY Left 10/10/2022   Procedure: LEFT HEART CATH AND CORONARY ANGIOGRAPHY;  Surgeon: Ammon Marsa, MD;  Location: [facility] IN[facility] CV [facility];  Service: Cardiovascular;  Laterality: Left;   TONSILLECTOMY     as a child   TOTAL KNEE ARTHROPLASTY Left 03/29/2015   [facility] Dr. Mardee   VASECTOMY     Patient Active Problem List   Diagnosis Date Noted   GI bleeding 07/11/2024   Encounter for fitting and adjustment of hearing aid 04/04/2022   Sensorineural hearing  loss, bilateral 04/04/2022   Atrial fibrillation (HCC) 04/04/2022   Hypertension 04/04/2022   Gastroesophageal reflux disease without esophagitis    Rheumatoid arthritis (HCC)    NSTEMI (non-ST elevated myocardial infarction) (HCC) 10/18/2021   Total knee replacement status 04/30/2020   Primary osteoarthritis of right knee 03/28/2020   CME (cystoid macular edema), left 07/07/2019   Nonexudative age-related macular degeneration, bilateral, intermediate dry stage 07/07/2019   SOB (shortness of breath) on exertion 08/22/2017   Chest pain with high risk for cardiac etiology 08/22/2017   Recurrent left inguinal hernia 01/04/2017   Allergic rhinitis 06/17/2015   CAD in native artery 06/17/2015   A-fib (HCC) 06/17/2015   Gonalgia 06/17/2015   Polypharmacy 06/17/2015   Cold sore 06/17/2015   Difficulty hearing 06/17/2015   HLD (hyperlipidemia) 06/17/2015   Essential hypertension 06/17/2015   Hypothyroidism 06/17/2015   Meniere's disease 06/17/2015   Buzzing in ear 06/17/2015   Beat, premature ventricular 06/17/2015   Status post total left knee replacement 04/13/2015   ONSET DATE: Beginning ~4-5 years ago, but worsening over the last 1-1.5 years  REFERRING DIAG: M19.041,M19.042 (ICD-10-CM) - Primary osteoarthritis of both hands   THERAPY DIAG:  Muscle weakness (generalized)  Other lack of coordination  Primary osteoarthritis of both hands  Rationale for Evaluation and Treatment: Rehabilitation  SUBJECTIVE:  SUBJECTIVE STATEMENT: Pt reports readiness for d/c assessment this date.   Pt accompanied by: self  PERTINENT HISTORY: Per medical record from rheumatologist appt on 05/05/24:  Subjective:HPI  Gary Mueller is a 83 y.o. male is here today for follow up of inflammation arthritis, osteoarthritis. The patient's allergies, current medications, past family history, past medical history, past social history, past surgical history and problem list were reviewed and updated as  appropriate.   He is having pain of the left 2nd and 4th PIP joints. He is not able to form a fist. He has poor grip. He does take Tylenol  for pains.  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  -- Continue to monitor of the methotrexate  -- Previous cortisone injection has helped the hand joints  -- Can take Tylenol  for pains as needed  -- Refer to OT for further evaluation  Return in about 6 months (around 11/05/2024) for Routine Follow Up.   PRECAUTIONS: Other: maintain joint protection strategies/cumulative trauma prevention  RED FLAGS: None   WEIGHT BEARING RESTRICTIONS: No  PAIN: 07/09/24: 3-4/10 in bilat hands Are you having pain? Yes: NPRS scale: 3-4 in bilat hands at rest, 7-8 with activity  Pain location: bilat hands, but increased tenderness to touch in the L  4th finger and thumb, R 5th digit (burning)   Pain description: sharp in both hands, burning in the R 5th digit and L thumb CMC  Aggravating factors: activity Relieving factors: elevating, rest, tylenol , heat   FALLS: Has patient fallen in last 6 months? No  LIVING ENVIRONMENT: Lives with: lives with their family, lives with adult son   PLOF: Retired from owning a Occupational psychologist; Currently plays golf a couple days per week (must use built up golf grips and has modified his grip), exercises at the [employer] at [facility] 3-4 times a week, walks dog 2-3x per week.    PATIENT GOALS: I'd like to be able to use them better, get more movement in my hands.   NEXT MD VISIT: Return to Dr. Tobie in ~6 months, per chart (Rheumatology)  OBJECTIVE:  Note: Objective measures were completed at Evaluation unless otherwise noted.  HAND  DOMINANCE: Right  ADLs: Eating: indep; pt reports no difficulties with cutting food or manipulating eating utensils Grooming: indep Upper body dressing: difficulty managing small buttons on a dress shirt; extra time needed Lower body dressing: pt tends to buy pants without buttons d/t difficulty with fasteners  Toileting: indep Bathing: pt reports that he frequently drops his soap d/t inability to fully close either hand Tub shower transfers: pt does have grab bars in shower, but reports he is still able to grasp bars without difficulty  Equipment: jar opener for kitchen  FUNCTIONAL OUTCOME MEASURES: MAM-20 for musculoskeletal conditions: TBD next session 05/15/24: 60/80 06/12/24: 66/80 07/09/24: 71/80  UPPER EXTREMITY ROM:     Active ROM Right eval Right 06/12/24 Right 07/09/24 Left eval Left 06/12/24 Left 07/09/24  Thumb MCP (0-60)        Thumb IP (0-80)        Thumb Radial abd/add (0-55)         Thumb Palmar abd/add (0-45)         Thumb Opposition to Small Finger         Index MCP (0-90) 80* flexion 85 90  78* flexion 80 85  Index PIP (0-100)         Index DIP (0-70)          Long MCP (0-90)          Long PIP (0-100) 20* flexion  60 65      Long DIP (0-70) 35* of radial deviation (passively -25* from neutral)  30* of radial deviation (passively -10* from neutral) *30 of radial deviation (passively -10* from neutral)       Ring MCP (0-90)          Ring PIP (0-100)          Ring DIP (0-70)          Little MCP (0-90)          Little PIP (0-100)          Little DIP (0-70)          (Blank rows = not tested)  Eval: Tip to palm: R LF lacking 3 cm; 06/12/24: R LF lacking 1.5 cm; 07/09/24: R LF able to full touch tip to palm  Eval: Tip to palm: L IF lacking 2.5 cm; 06/12/24: L IF lacking 2.0 cm; 07/09/24: L IF lacking 2.0 cm  Eval: Tip to palm: L RF lacking 1.5 cm; 06/12/24: L RF able to fully touch tip to palm; 07/09/24: able to fully touch tip to palm with less pain  Eval: Able  to oppose all digits to thumb on each hand, but thumb touches medial aspect (not tip) of 5th digit DIP on each hand; 06/12/24: No change   UPPER EXTREMITY MMT:     MMT Right eval Left eval  Shoulder flexion    Shoulder abduction    Shoulder adduction    Shoulder extension    Shoulder internal rotation    Shoulder external rotation    Middle trapezius    Lower trapezius    Elbow flexion    Elbow extension    Wrist flexion 5 5  Wrist extension 5 5  Wrist ulnar deviation    Wrist radial deviation    Wrist pronation    Wrist supination    (Blank rows = not tested)  HAND FUNCTION: Grip strength: Right: 35 lbs; Left: 28 lbs, Lateral pinch: Right: 13 lbs, Left: 11 lbs, and 3 point pinch: Right: 9 lbs, Left: 8 lbs 06/12/24: Grip Right: 51 lbs; Left: 46 lbs, lateral pinch: Right: 14 lbs, Left: 11 lbs, and 3 point pinch: Right: 12 lbs, Left: 12 lbs  07/09/24: Grip Right: 58 lbs; Left: 50 lbs; lateral pinch: Right: 14 lbs, Left: 12 lbs, and 3 point pinch: Right: 12 lbs, Left: 12 lbs   COORDINATION: 9 Hole Peg test: Right: 30 sec; Left: 30 sec 06/12/24: Right: 28 sec; Left: 27 sec   SENSATION: WFL  EDEMA:    L RF PIP circumference: 7.1 cm (Pt unable to doff wedding ring from L RF for 2 months now d/t increased swelling in digits); R RF PIP circumference: 6.9 cm 06/12/24: L RF PIP circumference: 6.9 cm 07/09/24: 6.9 L RF PIP   COGNITION: Overall cognitive status: Within functional limits for tasks assessed  OBSERVATIONS:  Pt pleasant/cooperative, and appears eager to improve functional use of hands with trial of OT.  TREATMENT DATE: 07/09/24 Moist heat used intermittently throughout session to manage pain and promote muscle relaxation for improving digit ROM in bilat hands.  Self Care: -Review of MAM-20 and ADL compensation strategies/AE used to improve task completion  Therapeutic Activity: -Objective measures taken and goals updated and reviewed for discharge summary.  PATIENT  EDUCATION: Education details: progress towards goals Person educated: Patient Education method: Explanation Education comprehension: verbalized understanding  HOME EXERCISE PROGRAM: Yellow/red theraputty; edema massage to digits in either hand  GOALS: Goals reviewed with patient? Yes  SHORT TERM GOALS: Target date: 06/24/24  Pt will be indep to perform HEP for improving bilat hand flexibility, strength, and coordination. Baseline: Eval: HEP not yet initiated; 06/12/24: Indep and consistent to perform HEP daily Goal status: achieved  2.  Pt will be indep to verbalize 2-3 joint protection/cumulative trauma prevention strategies to reduce pain in R/L hands.  Baseline: Eval: Education not yet initiated; 06/12/24: indep to implement strategies reviewed in previous sessions Goal status: achieved  3.  Pt will be indep to verbalize and implement 1-2 pain management strategies to reduce pain in bilat hands.  Baseline: Eval: Education not yet initiated; 06/12/24: Pt has started taking tylenol  prn/per recommendation from rheumatologist.  Pt uses heat regularly in the home and performs gentle stretching to reduce pain/stiffness in joints of hands.  Goal status: achieved  4.  Pt will be indep to monitor skin integrity of R LF while donning/doffing finger splint in order to reduce risk of skin breakdown. Baseline: Eval: Splint not yet issued/education not yet initiated; 06/12/24: using Ktape in place of splint; pt has had no skin breakdown from regular use  of Ktape and is indep to monitor for any adverse reactions Goal status: achieved  LONG TERM GOALS: Target date: 08/05/24  Pt will increase MAM-20 (for musculoskeletal conditions) score by 9 or more points in order to improve pt's perceived functional use of bilat hands with daily tasks. Baseline: Eval: MAM-20 to be given next session; 05/15/24: 60/80; 06/12/24: 66/80; 07/09/24: 71/80 Goal status: achieved  2.  Pt will increase bilat grip strength by 10  or more lbs in each hand to ease ability to open containers. Baseline: Eval: R 35 lbs, L 28 lbs (below age range norms); 06/12/24: Right: 51 lbs; Left: 46 lbs; 07/09/24: Right: 58 lbs; Left: 50 lbs Goal status: achieved/exceeded  3.  Pt will increase lateral pinch strength by 3 lbs or more in each hand to ease ability to open food packages.  Baseline: Eval: R 13 lbs, L 11 lbs; 06/12/24: R 14 lbs, L: 11 lbs;  R 14 lbs, L: 12 lbs Goal status: partially achieved/improved   4.  Pt will increase bilat hand flexibility to enable ability to formulate a fully closed fist to reduce frequency of dropping smaller ADL supplies in either hand. Baseline: Eval: (Tip to palm) R LF lacking 3cm, L IF lacking 2.5 cm, L RF lacking 1.5 cm; 06/12/24: R LF lacking 1.5 cm, L IF lacking 2.0 cm, L RF able to full touch tip to palm; 07/09/24: R LF able to fully touch tip to palm, L IF lacking 2 cm (improved), L RF able to fully touch tip to palm Goal status:partially achieved/improved  5.  Pt will follow recommended wearing schedule of Ktape with at least 75% adherence to reduce radial deviation in R LF DIP joint and reduce  risk of worsening hand deformity in R dominant hand. Baseline: Eval: Splint not yet issued; Pt presents with 35* of radial deviation on R LF DIP joint (able to passively reduce to 25*); 06/12/24: using Ktape regularly in place of oval 8; now 30* of radial deviation (passively -10* from neutral); 07/09/24: adherent to recommendations for use of Ktape and indep with application; radial deviation at the R LF DIP has reduced by 5 degrees at rest, and 15 degrees passively Goal status: achieved  6.  Pt will tolerate manual therapy, therapeutic modalities, and exercises to decrease pain in bilat hands to a reported 3/10 pain or less with daily activities.   Baseline: Eval: 3-4/10 pain in bilat hands at rest, 7-8/10 pain with activity; 06/12/24: pain can range from 0-6 at rest, and 7-8 with activity, but typically  improves with heat, soft tissue massage, occasional use of compression gloves, and activity modification; 7-8 with activity golf, 2-4 at rest; 07/09/24: Pt reports 3-4/10 pain in hands this date; pain fluctuates daily but pt has found that tylenol , Voltaren gel, compression gloves, and heat are effective to manage pain. Goal status: d/c  7.  Pt will tolerate and implement edema management strategies to reduce edema in bilat hands to allow doffing/donning wedding ring. Baseline: Pt reports he's been unable to doff wedding ring for ~2 months, but previously donned/doffed ring daily; L RF PIP joint circumference 7.1 cm, R 6.9 cm; 06/12/24: L RF PIP: 6.9 cm; pt is able to don/doff wedding ring and does plan to get ring re-sized for improved comfort/fit of ring on this finger Goal status: achieved    ASSESSMENT: CLINICAL IMPRESSION: Pt seen this date for OT d/c assessment.  Pt was seen by OT for 18 sessions to address functional decline related to  OA in bilat hands.  At d/c, all goals have been achieved or partially achieved/improved.  With improvements in bilat grip strength, pinch strength, finger flexibility, and activity modification or use of AE, pt has increased MAM-20 score from 60 to 71/80 at discharge, indicating improvement in self perceived functional use of hands for daily tasks.  Pt is indep with HEP, joint protection strategies and activity modification.  D/c this visit with max rehab potential met at this time.  Pt in agreement with plan.    PERFORMANCE DEFICITS: in functional skills including ADLs, IADLs, coordination, dexterity, edema, ROM, strength, pain, flexibility, Fine motor control, body mechanics, decreased knowledge of precautions, decreased knowledge of use of DME, and UE functional use, and psychosocial skills including coping strategies, environmental adaptation, habits, and routines and behaviors.   IMPAIRMENTS: are limiting patient from ADLs, IADLs, and leisure.   COMORBIDITIES:  has co-morbidities such as HTN, Afib, hx of Nstemi, HLD that affects occupational performance. Patient will benefit from skilled OT to address above impairments and improve overall function.  MODIFICATION OR ASSISTANCE TO COMPLETE EVALUATION: No modification of tasks or assist necessary to complete an evaluation.  OT OCCUPATIONAL PROFILE AND HISTORY: Detailed assessment: Review of records and additional review of physical, cognitive, psychosocial history related to current functional performance.  CLINICAL DECISION MAKING: Moderate - several treatment options, min-mod task modification necessary  REHAB POTENTIAL: Good  EVALUATION COMPLEXITY: Moderate    PLAN:  OT FREQUENCY: 2x/week  OT DURATION: 12 weeks  PLANNED INTERVENTIONS: 97168 OT Re-evaluation, 97535 self care/ADL training, 02889 therapeutic exercise, 97530 therapeutic activity, 97140 manual therapy, 97018 paraffin, 02989 moist heat, 97010 cryotherapy, 97034 contrast bath, 97760 Orthotic Initial, 97763 Orthotic/Prosthetic subsequent, passive range of motion, coping strategies training, patient/family education, and DME and/or AE instructions  RECOMMENDED OTHER SERVICES: None at this time  CONSULTED AND AGREED WITH PLAN OF CARE: Patient  PLAN FOR NEXT SESSION: N/A; d/c this visit   Inocente Blazing, MS, OTR/L   Inocente MARLA Blazing, OT 07/13/2024, 11:55 AM

## 2024-07-11 ENCOUNTER — Encounter: Payer: Self-pay | Admitting: Emergency Medicine

## 2024-07-11 ENCOUNTER — Emergency Department

## 2024-07-11 ENCOUNTER — Inpatient Hospital Stay
Admission: EM | Admit: 2024-07-11 | Discharge: 2024-07-13 | DRG: 378 | Disposition: A | Discharge status: Home / Self Care | Attending: Internal Medicine | Admitting: Internal Medicine

## 2024-07-11 ENCOUNTER — Other Ambulatory Visit: Payer: Self-pay

## 2024-07-11 DIAGNOSIS — I2510 Atherosclerotic heart disease of native coronary artery without angina pectoris: Secondary | ICD-10-CM | POA: Diagnosis present

## 2024-07-11 DIAGNOSIS — Z8249 Family history of ischemic heart disease and other diseases of the circulatory system: Secondary | ICD-10-CM

## 2024-07-11 DIAGNOSIS — Z96653 Presence of artificial knee joint, bilateral: Secondary | ICD-10-CM | POA: Diagnosis present

## 2024-07-11 DIAGNOSIS — I4891 Unspecified atrial fibrillation: Secondary | ICD-10-CM | POA: Diagnosis present

## 2024-07-11 DIAGNOSIS — I252 Old myocardial infarction: Secondary | ICD-10-CM

## 2024-07-11 DIAGNOSIS — K254 Chronic or unspecified gastric ulcer with hemorrhage: Principal | ICD-10-CM | POA: Diagnosis present

## 2024-07-11 DIAGNOSIS — Z9842 Cataract extraction status, left eye: Secondary | ICD-10-CM

## 2024-07-11 DIAGNOSIS — K589 Irritable bowel syndrome without diarrhea: Secondary | ICD-10-CM | POA: Diagnosis present

## 2024-07-11 DIAGNOSIS — Z85828 Personal history of other malignant neoplasm of skin: Secondary | ICD-10-CM

## 2024-07-11 DIAGNOSIS — Z885 Allergy status to narcotic agent status: Secondary | ICD-10-CM

## 2024-07-11 DIAGNOSIS — Z7902 Long term (current) use of antithrombotics/antiplatelets: Secondary | ICD-10-CM

## 2024-07-11 DIAGNOSIS — D62 Acute posthemorrhagic anemia: Secondary | ICD-10-CM | POA: Diagnosis present

## 2024-07-11 DIAGNOSIS — R7303 Prediabetes: Secondary | ICD-10-CM | POA: Diagnosis present

## 2024-07-11 DIAGNOSIS — Z955 Presence of coronary angioplasty implant and graft: Secondary | ICD-10-CM

## 2024-07-11 DIAGNOSIS — E785 Hyperlipidemia, unspecified: Secondary | ICD-10-CM | POA: Diagnosis present

## 2024-07-11 DIAGNOSIS — Z961 Presence of intraocular lens: Secondary | ICD-10-CM | POA: Diagnosis present

## 2024-07-11 DIAGNOSIS — F419 Anxiety disorder, unspecified: Secondary | ICD-10-CM | POA: Diagnosis present

## 2024-07-11 DIAGNOSIS — Z79890 Hormone replacement therapy (postmenopausal): Secondary | ICD-10-CM

## 2024-07-11 DIAGNOSIS — E538 Deficiency of other specified B group vitamins: Secondary | ICD-10-CM | POA: Diagnosis present

## 2024-07-11 DIAGNOSIS — I10 Essential (primary) hypertension: Secondary | ICD-10-CM | POA: Diagnosis present

## 2024-07-11 DIAGNOSIS — Z87891 Personal history of nicotine dependence: Secondary | ICD-10-CM

## 2024-07-11 DIAGNOSIS — K219 Gastro-esophageal reflux disease without esophagitis: Secondary | ICD-10-CM | POA: Diagnosis present

## 2024-07-11 DIAGNOSIS — I255 Ischemic cardiomyopathy: Secondary | ICD-10-CM | POA: Diagnosis present

## 2024-07-11 DIAGNOSIS — Z79899 Other long term (current) drug therapy: Secondary | ICD-10-CM

## 2024-07-11 DIAGNOSIS — E039 Hypothyroidism, unspecified: Secondary | ICD-10-CM | POA: Diagnosis present

## 2024-07-11 DIAGNOSIS — K920 Hematemesis: Secondary | ICD-10-CM

## 2024-07-11 DIAGNOSIS — Z7982 Long term (current) use of aspirin: Secondary | ICD-10-CM

## 2024-07-11 DIAGNOSIS — K922 Gastrointestinal hemorrhage, unspecified: Principal | ICD-10-CM | POA: Diagnosis present

## 2024-07-11 DIAGNOSIS — Z888 Allergy status to other drugs, medicaments and biological substances status: Secondary | ICD-10-CM

## 2024-07-11 DIAGNOSIS — Z9841 Cataract extraction status, right eye: Secondary | ICD-10-CM

## 2024-07-11 LAB — COMPREHENSIVE METABOLIC PANEL WITH GFR
ALT: 17 U/L (ref 0–44)
AST: 20 U/L (ref 15–41)
Albumin: 3.6 g/dL (ref 3.5–5.0)
Alkaline Phosphatase: 48 U/L (ref 38–126)
Anion gap: 9 (ref 5–15)
BUN: 47 mg/dL — ABNORMAL HIGH (ref 8–23)
CO2: 20 mmol/L — ABNORMAL LOW (ref 22–32)
Calcium: 9.4 mg/dL (ref 8.9–10.3)
Chloride: 111 mmol/L (ref 98–111)
Creatinine, Ser: 1.55 mg/dL — ABNORMAL HIGH (ref 0.61–1.24)
GFR, Estimated: 44 mL/min — ABNORMAL LOW (ref >60)
Glucose, Bld: 123 mg/dL — ABNORMAL HIGH (ref 70–99)
Potassium: 5 mmol/L (ref 3.5–5.1)
Sodium: 140 mmol/L (ref 135–145)
Total Bilirubin: 0.8 mg/dL (ref 0.0–1.2)
Total Protein: 6.2 g/dL — ABNORMAL LOW (ref 6.5–8.1)

## 2024-07-11 LAB — CBC
HCT: 35.4 % — ABNORMAL LOW (ref 39.0–52.0)
Hemoglobin: 11.6 g/dL — ABNORMAL LOW (ref 13.0–17.0)
MCH: 31.9 pg (ref 26.0–34.0)
MCHC: 32.8 g/dL (ref 30.0–36.0)
MCV: 97.3 fL (ref 80.0–100.0)
Platelets: 172 K/uL (ref 150–400)
RBC: 3.64 MIL/uL — ABNORMAL LOW (ref 4.22–5.81)
RDW: 13.2 % (ref 11.5–15.5)
WBC: 12 K/uL — ABNORMAL HIGH (ref 4.0–10.5)
nRBC: 0 % (ref 0.0–0.2)

## 2024-07-11 LAB — TYPE AND SCREEN
ABO/RH(D): B POS
Antibody Screen: NEGATIVE

## 2024-07-11 LAB — URINALYSIS, ROUTINE W REFLEX MICROSCOPIC
Bilirubin Urine: NEGATIVE
Glucose, UA: NEGATIVE mg/dL
Hgb urine dipstick: NEGATIVE
Ketones, ur: 5 mg/dL — AB
Leukocytes,Ua: NEGATIVE
Nitrite: NEGATIVE
Protein, ur: NEGATIVE mg/dL
Specific Gravity, Urine: 1.017 (ref 1.005–1.030)
pH: 5 (ref 5.0–8.0)

## 2024-07-11 LAB — PROTIME-INR
INR: 1.2 (ref 0.8–1.2)
Prothrombin Time: 15.4 s — ABNORMAL HIGH (ref 11.4–15.2)

## 2024-07-11 LAB — LIPASE, BLOOD: Lipase: 39 U/L (ref 11–51)

## 2024-07-11 MED ORDER — ONDANSETRON HCL 4 MG PO TABS: 4.0000 mg | ORAL_TABLET | Freq: Four times a day (QID) | ORAL | Status: DC | PRN

## 2024-07-11 MED ORDER — ONDANSETRON HCL 4 MG/2ML IJ SOLN: 4.0000 mg | Freq: Four times a day (QID) | INTRAMUSCULAR | Status: DC | PRN

## 2024-07-11 MED ORDER — METOPROLOL SUCCINATE ER 25 MG PO TB24
12.5000 mg | ORAL_TABLET | Freq: Every evening | ORAL | Status: DC
  Administered 2024-07-11 – 2024-07-12 (×2): 12.5 mg via ORAL
  Filled 2024-07-11 (×2): qty 1

## 2024-07-11 MED ORDER — ACETAMINOPHEN 650 MG RE SUPP
650.0000 mg | Freq: Four times a day (QID) | RECTAL | Status: DC | PRN
Start: 2024-07-11 — End: 2024-07-13

## 2024-07-11 MED ORDER — POLYETHYLENE GLYCOL 3350 17 G PO PACK: 17.0000 g | PACK | Freq: Every day | ORAL | Status: DC | PRN

## 2024-07-11 MED ORDER — PANTOPRAZOLE SODIUM 40 MG IV SOLR
40.0000 mg | Freq: Two times a day (BID) | INTRAVENOUS | Status: DC
  Administered 2024-07-11 – 2024-07-12 (×4): 40 mg via INTRAVENOUS
  Filled 2024-07-11 (×4): qty 10

## 2024-07-11 MED ORDER — LEVOTHYROXINE SODIUM 112 MCG PO TABS
112.0000 ug | ORAL_TABLET | Freq: Every day | ORAL | Status: DC
  Administered 2024-07-12: 112 ug via ORAL
  Filled 2024-07-11 (×2): qty 1

## 2024-07-11 MED ORDER — SODIUM CHLORIDE 0.9 % IV SOLN: INTRAVENOUS | Status: AC

## 2024-07-11 MED ORDER — AMLODIPINE BESYLATE 5 MG PO TABS
5.0000 mg | ORAL_TABLET | Freq: Every day | ORAL | Status: DC
  Administered 2024-07-12 – 2024-07-13 (×2): 5 mg via ORAL
  Filled 2024-07-11 (×2): qty 1

## 2024-07-11 MED ORDER — ACETAMINOPHEN 325 MG PO TABS: 650.0000 mg | ORAL_TABLET | Freq: Four times a day (QID) | ORAL | Status: DC | PRN

## 2024-07-11 MED ORDER — PANTOPRAZOLE SODIUM 40 MG IV SOLR
40.0000 mg | Freq: Once | INTRAVENOUS | Status: AC
  Administered 2024-07-11: 40 mg via INTRAVENOUS
  Filled 2024-07-11: qty 10

## 2024-07-11 NOTE — ED Triage Notes (Signed)
 Patient to ED via POV for vomiting. PT states there was blood in [place]it [place]. States feeling weak and SOB. States abd pain x2-3 days.

## 2024-07-11 NOTE — ED Provider Notes (Signed)
 [facility] Provider Note    Event Date/Time   First MD Initiated Contact with Patient 07/11/24 1258     (approximate)   History   Emesis   HPI  Gary Mueller is a 83 y.o. male history of atrial fibs, CAD, thyroid  disease, hypertension, hyperlipidemia and who is currently on Plavix presents emergency department with epigastric pain, coffee-ground emesis x 4 today and dizziness.  Patient was [place] the [place] when the first episode happened.  Had 1 when he got to the car and tomorrow at home.  States now he is feeling better.  At first thought maybe it was from eating too many tomatoes.  Has not had dark tarry stools that he knows of.  Patient has chronic shortness of breath.  Has been evaluated by his cardiologist and his regular doctor for the shortness of breath.  Last ejection fraction 1 month ago was 52      Physical Exam   Triage Vital Signs: ED Triage Vitals  Encounter Vitals Group     BP 07/11/24 1234 139/61     Girls Systolic BP Percentile --      Girls Diastolic BP Percentile --      Boys Systolic BP Percentile --      Boys Diastolic BP Percentile --      Pulse Rate 07/11/24 1234 71     Resp 07/11/24 1234 17     Temp 07/11/24 1237 98.1 F (36.7 C)     Temp src --      SpO2 07/11/24 1234 100 %     Weight 07/11/24 1235 170 lb (77.1 kg)     Height 07/11/24 1235 6' (1.829 m)     Head Circumference --      Peak Flow --      Pain Score 07/11/24 1235 0     Pain Loc --      Pain Education --      Exclude from Growth Chart --     Most recent vital signs: Vitals:   07/11/24 1237 07/11/24 1408  BP:  130/60  Pulse:  70  Resp:  18  Temp: 98.1 F (36.7 C)   SpO2:  100%     General: Awake, no distress.   CV:  Good peripheral perfusion.  Resp:  Normal effort. Abd:  No distention.  Tender in the epigastric area Other:      ED Results / Procedures / Treatments   Labs (all labs ordered are listed, but only abnormal results are  displayed) Labs Reviewed  COMPREHENSIVE METABOLIC PANEL WITH GFR - Abnormal; Notable for the following components:      Result Value   CO2 20 (*)    Glucose, Bld 123 (*)    BUN 47 (*)    Creatinine, Ser 1.55 (*)    Total Protein 6.2 (*)    GFR, Estimated 44 (*)    All other components within normal limits  CBC - Abnormal; Notable for the following components:   WBC 12.0 (*)    RBC 3.64 (*)    Hemoglobin 11.6 (*)    HCT 35.4 (*)    All other components within normal limits  LIPASE, BLOOD  URINALYSIS, ROUTINE W REFLEX MICROSCOPIC  PROTIME-INR  TYPE AND SCREEN     EKG  EKG   RADIOLOGY Chest x-ray    PROCEDURES:   Procedures  Critical Care: None Chief Complaint  Patient presents with   Emesis  MEDICATIONS ORDERED IN ED: Medications  acetaminophen  (TYLENOL ) tablet 650 mg (has no administration in time range)    Or  acetaminophen  (TYLENOL ) suppository 650 mg (has no administration in time range)  polyethylene glycol (MIRALAX / GLYCOLAX) packet 17 g (has no administration in time range)  ondansetron  (ZOFRAN ) tablet 4 mg (has no administration in time range)    Or  ondansetron  (ZOFRAN ) injection 4 mg (has no administration in time range)  pantoprazole  (PROTONIX ) injection 40 mg (has no administration in time range)  pantoprazole  (PROTONIX ) injection 40 mg (40 mg Intravenous Given 07/11/24 1403)     IMPRESSION / MDM / ASSESSMENT AND PLAN / ED COURSE  I reviewed the triage vital signs and the nursing notes.                              Differential diagnosis includes, but is not limited to, GI bleed, upper GI bleed, peptic ulcer disease, H. pylori, chronic shortness of breath  Patient's presentation is most consistent with acute illness / injury with system symptoms.   Cardiac monitor no Medications given: Protonix  40 mg IV  CBC CAD with hemoglobin 11.6 which is in his normal trend, white blood cell count slightly elevated at 12, metabolic panel shows  some kidney disease with a BUN of 47 and creatinine of 1.55, otherwise reassuring  Due to the coffee-ground emesis along with dizziness and chronic shortness of breath feel patient would benefit from being admitted and have an endoscopy.  Did discuss this with Dr. Claudene.  He agrees no CT at this time as patient mostly needs a endoscopy.  The patient and his daughter are in agreement with this treatment plan.  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  Consult to GI, Dr. Delana states cannot scope today due to Plavix will need to be off Plavix for 5 days  Consult hospitalist, patient to be admitted for observation.  He will need to stop his Plavix  FINAL CLINICAL IMPRESSION(S) / ED DIAGNOSES   Final diagnoses:  Upper GI bleed  Coffee ground emesis     Rx / DC Orders   ED Discharge Orders     None        Note:  This document was prepared using Dragon voice recognition software and may include unintentional dictation errors.    Gasper Devere ORN, PA-C 07/11/24 1458    Claudene Rover, MD 07/11/24 [phone]

## 2024-07-11 NOTE — H&P (Signed)
 History and Physical    STANISLAV GERVASE FMW:[id number] DOB: [birth year] DOA: 07/11/2024  DOS: the patient was seen and examined on 07/11/2024  PCP: Bertrum Charlie LITTIE, MD   Patient coming from: Home  I have personally briefly reviewed patient's old medical records in [facility]  Chief Complaint:   HPI: Gary Mueller is a pleasant 83 y.o. male with medical history significant for CAD s/p stenting in 2023 now on Plavix, A-fib not on anticoagulation, HTN, hypothyroidism, HLD who came into the ED complaining of epigastric pain and coffee-ground emesis 4 times today.  Patient stated that [place] 9[place]00 this [place] when he started feeling dizzy and not feeling well.  He went to his car, rested for some time, he started vomiting.  In his car he vomited 3 times black coffee-ground material no food.  Then he went home and he had fourth episode of vomiting.  He called his daughter [name] who came in to help him and drove him to emergency room.  Patient denied any black stools.  Patient stated that he was eating too many tomatoes and thought that that may be the cause.  Patient has a chronic shortness of breath and he had seen cardiologist.  His last ejection fraction on echocardiogram was 50 to 50-month ago. Patient denies any fever chills nausea vomiting abdomen pain chest pain shortness of breath or palpitations.  ED Course: Upon arrival to the ED, patient is found to have normal vital signs, afebrile, slightly low hematocrit at 11.6 and 35.4 which is comparable to his previous numbers.  GI was consulted and they advised that he needs to be off Plavix for 5 days before they can scope him.  Hospitalist service was consulted for evaluation for admission.  Review of Systems:  ROS  All other systems negative except as noted in the HPI.  Past Medical History:  Diagnosis Date   Allergy    Anxiety    Arthritis    Cancer (HCC) 10/20/2016   Basal Cell Skin Cancer; lip, neck   Cystoid  macular edema of left eye 07/07/2019   Depression    Dysrhythmia    A FIB   GERD (gastroesophageal reflux disease)    HOH (hard of hearing)    Bilateral   Hyperlipidemia    Hypertension    Patient denies   Hypothyroidism    IBS (irritable bowel syndrome)    Inguinal hernia    bilateral   Pneumonia    Thyroid  disease    Tinnitus of both ears     Past Surgical History:  Procedure Laterality Date   ANTERIOR VITRECTOMY Left 11/28/2018   Procedure: ANTERIOR VITRECTOMY;  Surgeon: Ferol Rogue, MD;  Location: [facility];  Service: Ophthalmology;  Laterality: Left;   CARDIAC CATHETERIZATION     CATARACT EXTRACTION W/PHACO Right 10/10/2018   Procedure: CATARACT EXTRACTION PHACO AND INTRAOCULAR LENS PLACEMENT (IOC);  Surgeon: Ferol Rogue, MD;  Location: [facility];  Service: Ophthalmology;  Laterality: Right;  US  00:41 CDE 6.87 Fluid pack lot # [id number]   CATARACT EXTRACTION W/PHACO Left 11/28/2018   Procedure: CATARACT EXTRACTION PHACO AND INTRAOCULAR LENS PLACEMENT (IOC)-LEFT;  Surgeon: Ferol Rogue, MD;  Location: [facility];  Service: Ophthalmology;  Laterality: Left;  US  00:47.7 CDE 8.92 Fluid Pack lot # [id number]   CHOLECYSTECTOMY  1968   CHONDROPLASTY Right 01/02/2018   Procedure: CHONDROPLASTY;  Surgeon: Mardee Lynwood SQUIBB, MD;  Location: [facility];  Service: Orthopedics;  Laterality: Right;  COLONOSCOPY  2014   CORONARY STENT INTERVENTION N/A 10/19/2021   Procedure: CORONARY STENT INTERVENTION;  Surgeon: Ammon Blunt, MD;  Location: [facility];  Service: Cardiovascular;  Laterality: N/A;   EYE SURGERY     HERNIA REPAIR Left 04/18/1996   inguinal hernia repair/ Dr Dessa   HERNIA REPAIR Right 02/26/1996   Dr Dessa   HERNIA REPAIR Left 08/07/2002   Dr Dessa   JOINT REPLACEMENT     KNEE ARTHROPLASTY Right 04/30/2020   Procedure: COMPUTER ASSISTED TOTAL KNEE ARTHROPLASTY;  Surgeon: Mardee Lynwood SQUIBB, MD;  Location: [facility];  Service: Orthopedics;  Laterality:  Right;   KNEE ARTHROSCOPY Right 01/02/2018   Procedure: ARTHROSCOPY KNEE;  Surgeon: Mardee Lynwood SQUIBB, MD;  Location: [facility];  Service: Orthopedics;  Laterality: Right;   KNEE ARTHROSCOPY Left 02/14/2012   partial menisectomy and chondroplasty   KNEE ARTHROSCOPY WITH MEDIAL MENISECTOMY Right 01/02/2018   Procedure: KNEE ARTHROSCOPY WITH MEDIAL MENISECTOMY;  Surgeon: Mardee Lynwood SQUIBB, MD;  Location: [facility];  Service: Orthopedics;  Laterality: Right;   LEFT HEART CATH AND CORONARY ANGIOGRAPHY N/A 10/19/2021   Procedure: LEFT HEART CATH AND CORONARY ANGIOGRAPHY;  Surgeon: Ammon Blunt, MD;  Location: [facility];  Service: Cardiovascular;  Laterality: N/A;   LEFT HEART CATH AND CORONARY ANGIOGRAPHY Left 10/10/2022   Procedure: LEFT HEART CATH AND CORONARY ANGIOGRAPHY;  Surgeon: Ammon Blunt, MD;  Location: [facility];  Service: Cardiovascular;  Laterality: Left;   TONSILLECTOMY     as a child   TOTAL KNEE ARTHROPLASTY Left 03/29/2015   [facility] Dr. Mardee   VASECTOMY       reports that he quit smoking about 59 years ago. His smoking use included cigarettes. He has never used smokeless tobacco. He reports current alcohol  use. He reports that he does not use drugs.  Allergies  Allergen Reactions   Codeine     GI intolerance; hallucinations    Statins     Muscle pain   Tamsulosin Cough    Family History  Problem Relation Age of Onset   Heart disease Mother        A fib   Healthy Sister     Prior to Admission medications   Medication Sig Start Date End Date Taking? Authorizing Provider  acetaminophen  (TYLENOL ) 500 MG tablet Take 1,000 mg by mouth daily as needed for moderate pain.    [provider]  amLODipine  (NORVASC ) 5 MG tablet Take 1 tablet (5 mg total) by mouth daily. Please schedule office visit before any future refill. 04/16/23   Simmons-Robinson, Rockie, MD  ascorbic acid  (VITAMIN C) 1000 MG tablet Take 1 tablet by mouth daily.     [provider]  aspirin  EC 81 MG EC tablet Take 1 tablet (81 mg total) by mouth daily. Swallow whole. 10/20/21   Josette Ade, MD  clopidogrel (PLAVIX) 75 MG tablet Take 75 mg by mouth daily. 03/13/22   [provider]  fluticasone  (FLONASE ) 50 MCG/ACT nasal spray Place 2 sprays into both nostrils daily. 03/01/22   Bertrum Ade CROME, MD  folic acid  (FOLVITE ) 1 MG tablet Take 1 mg by mouth daily.    [provider]  levothyroxine  (SYNTHROID ) 112 MCG tablet Take 1 tablet (112 mcg total) by mouth daily. 08/17/22   Bertrum Ade CROME, MD  losartan  (COZAAR ) 50 MG tablet TAKE ONE TABLET BY MOUTH EVERY DAY 05/15/22   Bertrum Ade CROME, MD  methotrexate 2.5 MG tablet Take 15 mg by  mouth every Wednesday. Patient not taking: Reported on 05/13/2024    [provider]  metoprolol  succinate (TOPROL -XL) 25 MG 24 hr tablet TAKE 1/2 TABLET BY MOUTH EVERY EVENING 04/23/23   Simmons-Robinson, Makiera, MD  Multiple Vitamin (MULTIVITAMIN WITH MINERALS) TABS tablet Take 1 tablet by mouth every evening.     [provider]  nitroGLYCERIN  (NITROSTAT ) 0.4 MG SL tablet Place 1 tablet (0.4 mg total) under the tongue every 5 (five) minutes as needed for chest pain. 10/20/21   Josette Ade, MD  pantoprazole  (PROTONIX ) 40 MG tablet TAKE ONE TABLET (40 MG) BY MOUTH EVERY DAY Patient not taking: Reported on 05/13/2024 04/10/23   Gasper Nancyann BRAVO, MD  rosuvastatin  (CRESTOR ) 40 MG tablet TAKE ONE TABLET BY MOUTH EVERY DAY 02/06/22   Bertrum Ade CROME, MD  ticagrelor  (BRILINTA ) 90 MG TABS tablet Take 1 tablet (90 mg total) by mouth 2 (two) times daily. 08/18/22   Bertrum Ade CROME, MD  vitamin E  180 MG (400 UNITS) capsule Take 1 capsule by mouth daily.    [provider]    Physical Exam: Vitals:   07/11/24 1234 07/11/24 1235 07/11/24 1237 07/11/24 1408  BP: 139/61   130/60  Pulse: 71   70  Resp: 17   18  Temp:   98.1 F (36.7 C)   SpO2: 100%   100%  Weight:  77.1 kg     Height:  6' (1.829 m)      Physical Exam   Constitutional: Alert, awake, calm, comfortable HEENT: Neck supple Respiratory: Clear to auscultation B/L, no wheezing, no rales.  Cardiovascular: Regular rate and rhythm, no murmurs / rubs / gallops. No extremity edema. 2+ pedal pulses. No carotid bruits.  Abdomen: Soft, no tenderness, Bowel sounds positive.  Musculoskeletal: no clubbing / cyanosis. Good ROM, no contractures. Normal muscle tone.  Skin: no rashes, lesions, ulcers. Neurologic: CN 2-12 grossly intact. Sensation intact, No focal deficit identified Psychiatric: Alert and oriented x 3. Normal mood.    Labs on Admission: I have personally reviewed following labs and imaging studies  CBC: Recent Labs  Lab 07/11/24 1245  WBC 12.0*  HGB 11.6*  HCT 35.4*  MCV 97.3  PLT 172   Basic Metabolic Panel: Recent Labs  Lab 07/11/24 1245  NA 140  K 5.0  CL 111  CO2 20*  GLUCOSE 123*  BUN 47*  CREATININE 1.55*  CALCIUM  9.4   GFR: Estimated Creatinine Clearance: 38.7 mL/min (A) (by C-G formula based on SCr of 1.55 mg/dL (H)). Liver Function Tests: Recent Labs  Lab 07/11/24 1245  AST 20  ALT 17  ALKPHOS 48  BILITOT 0.8  PROT 6.2*  ALBUMIN 3.6   Recent Labs  Lab 07/11/24 1245  LIPASE 39   Urine analysis:    Component Value Date/Time   COLORURINE YELLOW 04/22/2020 1226   APPEARANCEUR CLEAR 04/22/2020 1226   APPEARANCEUR CLEAR 03/17/2015 0928   LABSPEC >1.030 (H) 04/22/2020 1226   LABSPEC 1.016 03/17/2015 0928   PHURINE 6.0 04/22/2020 1226   GLUCOSEU NEGATIVE 04/22/2020 1226   GLUCOSEU NEGATIVE 03/17/2015 0928   HGBUR NEGATIVE 04/22/2020 1226   BILIRUBINUR NEGATIVE 04/22/2020 1226   BILIRUBINUR NEGATIVE 03/17/2015 0928   KETONESUR NEGATIVE 04/22/2020 1226   PROTEINUR NEGATIVE 04/22/2020 1226   NITRITE NEGATIVE 04/22/2020 1226   LEUKOCYTESUR NEGATIVE 04/22/2020 1226   LEUKOCYTESUR NEGATIVE 03/17/2015 0928    Radiological Exams on Admission: I have  personally reviewed images DG Chest Portable 1 View Result Date: 07/11/2024 CLINICAL DATA:  Shortness of breath, weakness EXAM: PORTABLE CHEST 1 VIEW COMPARISON:  March 20, 2024 and prior studies FINDINGS: Unchanged elevation of the right hemidiaphragm with right lung volume loss. Likely bibasilar scarring/atelectasis no new consolidation. Unchanged cardiomediastinal silhouette. No acute osseous findings. Radiodensity projecting over the right lung apex and possibly external to the patient. IMPRESSION: Unchanged elevation of the right hemidiaphragm. Electronically Signed   By: Michaeline Blanch M.D.   On: 07/11/2024 14:04    EKG: My personal interpretation of EKG shows: Normal sinus rhythm, right bundle branch block    Assessment/Plan Principal Problem:   GI bleeding Active Problems:   CAD in native artery   A-fib [facility])   Essential hypertension   Hypothyroidism    Assessment and Plan: 83 year old male with past medical history of HTN, HLD, CAD s/p stent on Plavix, A-fib not on anticoagulation presented to ED at [facility] for 4 episodes of coffee-ground emesis started this morning.  1.  Acute GI bleeding - Patient denies any NSAIDs except Plavix. - He does not have melena - He will be placed in observation - He will be given Protonix  IV twice a day, type and screen, monitor CBC - GI consult will be called  2.  CAD s/p stenting - Will hold off Plavix at this point  3.  Atrial fibrillation - Continue on metoprolol   4.  HTN - Blood pressure is stable - Continue on amlodipine  and metoprolol   5.  Hypothyroidism - Continue levothyroxine     DVT prophylaxis: SCDs Code Status: Full Code Family Communication: Daughter [name] was at bedside Disposition Plan: Home Consults called: GI Admission status: Observation, Med-Surg   Nena Rebel, MD [facility] 07/11/2024, 2:59 PM

## 2024-07-11 NOTE — ED Notes (Signed)
Informed RN bed assigned 

## 2024-07-11 NOTE — Consult Note (Signed)
 Gary JONELLE Brooklyn, MD [address]  [place], [place] [address]  Main: [phone] Fax:  [phone] Pager: [phone]   Consultation  Referring Provider:     No ref. provider found Primary Care Physician:  Bertrum Charlie CROME, MD Primary Gastroenterologist: Sampson         Reason for Consultation:     Epigastric pain, coffee-ground emesis, hematochezia  Date of Admission:  07/11/2024 Date of Consultation:  07/12/2024         HPI:   Gary Mueller is a 83 y.o. male with history of coronary artery disease, NSTEMI in 09/2021 s/p PCI with DES on Plavix, mild to moderate ischemic cardiomyopathy presented with epigastric pain for 2 to 3 days, nausea and coffee[place]ground emesis [place] associated with weakness and shortness of breath.  He was hemodynamically stable in the ER, found to have hemoglobin of 11.6, normal MCV, normal platelets, elevated BUN/creatinine 47/1.55, normal serum lipase, LFTs, PT/INR.  Repeat hemoglobin dropped to 9.5 today.  He is on Plavix, last dose was on Thursday evening.  Patient had 2 episodes of black tarry stools today followed by an episode of dark red bowel movement.  He underwent CT angio bleeding protocol which did not reveal any active extravasation.  He has history of sigmoid diverticulosis.  When I spoke to the patient, bedside he denied any chest pain, he was feeling short of breath speaking in full sentences.  He reports mild discomfort in the epigastric region.  He denies any nausea, vomiting.  Tolerating clear liquids well. He does not smoke or drink alcohol  Started on Protonix  40 mg IV twice daily   NSAIDs: None  Antiplts/Anticoagulants/Anti thrombotics: Plavix, last dose on Thursday evening  GI Procedures: Colonoscopy 2014, diverticulosis only  Past Medical History:  Diagnosis Date   Allergy    Anxiety    Arthritis    Cancer (HCC) 10/20/2016   Basal Cell Skin Cancer; lip, neck   Cystoid macular edema of left eye  07/07/2019   Depression    Dysrhythmia    A FIB   GERD (gastroesophageal reflux disease)    HOH (hard of hearing)    Bilateral   Hyperlipidemia    Hypertension    Patient denies   Hypothyroidism    IBS (irritable bowel syndrome)    Inguinal hernia    bilateral   Pneumonia    Thyroid  disease    Tinnitus of both ears     Past Surgical History:  Procedure Laterality Date   ANTERIOR VITRECTOMY Left 11/28/2018   Procedure: ANTERIOR VITRECTOMY;  Surgeon: Ferol Rogue, MD;  Location: [facility];  Service: Ophthalmology;  Laterality: Left;   CARDIAC CATHETERIZATION     CATARACT EXTRACTION W/PHACO Right 10/10/2018   Procedure: CATARACT EXTRACTION PHACO AND INTRAOCULAR LENS PLACEMENT (IOC);  Surgeon: Ferol Rogue, MD;  Location: [facility];  Service: Ophthalmology;  Laterality: Right;  US  00:41 CDE 6.87 Fluid pack lot # [id number]   CATARACT EXTRACTION W/PHACO Left 11/28/2018   Procedure: CATARACT EXTRACTION PHACO AND INTRAOCULAR LENS PLACEMENT (IOC)-LEFT;  Surgeon: Ferol Rogue, MD;  Location: [facility];  Service: Ophthalmology;  Laterality: Left;  US  00:47.7 CDE 8.92 Fluid Pack lot # [id number]   CHOLECYSTECTOMY  1968   CHONDROPLASTY Right 01/02/2018   Procedure: CHONDROPLASTY;  Surgeon: Mardee Lynwood SQUIBB, MD;  Location: [facility];  Service: Orthopedics;  Laterality: Right;   COLONOSCOPY  2014   CORONARY STENT INTERVENTION N/A 10/19/2021   Procedure: CORONARY STENT INTERVENTION;  Surgeon: Ammon Blunt, MD;  Location: [facility];  Service: Cardiovascular;  Laterality: N/A;   EYE SURGERY     HERNIA REPAIR Left 04/18/1996   inguinal hernia repair/ Dr Dessa   HERNIA REPAIR Right 02/26/1996   Dr Dessa   HERNIA REPAIR Left 08/07/2002   Dr Dessa   JOINT REPLACEMENT     KNEE ARTHROPLASTY Right 04/30/2020   Procedure: COMPUTER ASSISTED TOTAL KNEE ARTHROPLASTY;  Surgeon: Mardee Lynwood SQUIBB, MD;  Location: [facility];  Service: Orthopedics;  Laterality: Right;   KNEE ARTHROSCOPY  Right 01/02/2018   Procedure: ARTHROSCOPY KNEE;  Surgeon: Mardee Lynwood SQUIBB, MD;  Location: [facility];  Service: Orthopedics;  Laterality: Right;   KNEE ARTHROSCOPY Left 02/14/2012   partial menisectomy and chondroplasty   KNEE ARTHROSCOPY WITH MEDIAL MENISECTOMY Right 01/02/2018   Procedure: KNEE ARTHROSCOPY WITH MEDIAL MENISECTOMY;  Surgeon: Mardee Lynwood SQUIBB, MD;  Location: [facility];  Service: Orthopedics;  Laterality: Right;   LEFT HEART CATH AND CORONARY ANGIOGRAPHY N/A 10/19/2021   Procedure: LEFT HEART CATH AND CORONARY ANGIOGRAPHY;  Surgeon: Ammon Blunt, MD;  Location: [facility];  Service: Cardiovascular;  Laterality: N/A;   LEFT HEART CATH AND CORONARY ANGIOGRAPHY Left 10/10/2022   Procedure: LEFT HEART CATH AND CORONARY ANGIOGRAPHY;  Surgeon: Ammon Blunt, MD;  Location: [facility];  Service: Cardiovascular;  Laterality: Left;   TONSILLECTOMY     as a child   TOTAL KNEE ARTHROPLASTY Left 03/29/2015   [facility] Dr. Hooten   VASECTOMY       Current Facility-Administered Medications:    acetaminophen  (TYLENOL ) tablet 650 mg, 650 mg, Oral, Q6H PRN **OR** acetaminophen  (TYLENOL ) suppository 650 mg, 650 mg, Rectal, Q6H PRN, Paudel, Keshab, MD   amLODipine  (NORVASC ) tablet 5 mg, 5 mg, Oral, Daily, Paudel, Keshab, MD, 5 mg at 07/12/24 0847   lactated ringers  infusion, , Intravenous, Continuous, Marjean Imperato, Gary Skiff, MD, Last Rate: 75 mL/hr at 07/12/24 1527, New Bag at 07/12/24 1527   levothyroxine  (SYNTHROID ) tablet 112 mcg, 112 mcg, Oral, Daily, Paudel, Keshab, MD, 112 mcg at 07/12/24 0540   metoprolol  succinate (TOPROL -XL) 24 hr tablet 12.5 mg, 12.5 mg, Oral, QPM, Paudel, Keshab, MD, 12.5 mg at 07/11/24 1840   ondansetron  (ZOFRAN ) tablet 4 mg, 4 mg, Oral, Q6H PRN **OR** ondansetron  (ZOFRAN ) injection 4 mg, 4 mg, Intravenous, Q6H PRN, Paudel, Keshab, MD   pantoprazole  (PROTONIX ) injection 40 mg, 40 mg, Intravenous, Q12H, Paudel, Keshab, MD, 40 mg at 07/12/24 0847    polyethylene glycol (MIRALAX  / GLYCOLAX ) packet 17 g, 17 g, Oral, Daily PRN, Paudel, Keshab, MD   polyethylene glycol powder (GLYCOLAX /MIRALAX ) container 238 g, 238 g, Oral, Once, Atharva Mirsky, Gary Skiff, MD   Family History  Problem Relation Age of Onset   Heart disease Mother        A fib   Healthy Sister      Social History   Tobacco Use   Smoking status: Former    Current packs/day: 0.00    Types: Cigarettes    Quit date: 12/24/1964    Years since quitting: 59.5   Smokeless tobacco: Never  Vaping Use   Vaping status: Never Used  Substance Use Topics   Alcohol  use: Yes    Comment: 0-1 beer or glass of wine monthly   Drug use: No    Allergies as of 07/11/2024 - Review Complete 07/11/2024  Allergen Reaction Noted   Codeine  06/17/2015   Statins  02/13/2018   Tamsulosin  Cough 05/27/2024  Review of Systems:    All systems reviewed and negative except where noted in HPI.   Physical Exam:  Vital signs in last 24 hours: Temp:  [97.4 F (36.3 C)-98.5 F (36.9 C)] 98.3 F (36.8 C) (07/19 1508) Pulse Rate:  [52-60] 60 (07/19 1508) Resp:  [18-20] 20 (07/19 1508) BP: (102-154)/(47-66) 118/59 (07/19 1508) SpO2:  [96 %-100 %] 96 % (07/19 1508) Last BM Date : 07/12/24 General:   Alert,  Well-developed, well-nourished, pleasant and cooperative in NAD Eyes:  Sclera clear, no icterus.   Conjunctiva pink. Lungs:  Respirations even and unlabored.  Clear throughout to auscultation.   No wheezes, crackles, or rhonchi. No acute distress. Heart:  Regular rate and rhythm; no murmurs, clicks, rubs, or gallops. Abdomen:  Normal bowel sounds. Soft, non-tender and non-distended without masses, hepatosplenomegaly or hernias noted.  No guarding or rebound tenderness.   Rectal: Not performed Extremities:  No clubbing or edema.  No cyanosis. Neurologic:  Alert and oriented x3 Skin:  Intact without significant lesions or rashes. No jaundice. Psych:  Alert and cooperative. Normal mood and  affect.  LAB RESULTS:    Latest Ref Rng & Units 07/12/2024    3:46 AM 07/11/2024   12:45 PM 08/17/2022   10:27 AM  CBC  WBC 4.0 - 10.5 K/uL 6.9  12.0  5.4   Hemoglobin 13.0 - 17.0 g/dL 9.5  88.3  88.4   Hematocrit 39.0 - 52.0 % 28.9  35.4  34.3   Platelets 150 - 400 K/uL 156  172  187     BMET    Latest Ref Rng & Units 07/12/2024    3:46 AM 07/11/2024   12:45 PM 08/17/2022   10:27 AM  BMP  Glucose 70 - 99 mg/dL 97  876  899   BUN 8 - 23 mg/dL 58  47  30   Creatinine 0.61 - 1.24 mg/dL 8.61  8.44  8.69   BUN/Creat Ratio 10 - 24   23   Sodium 135 - 145 mmol/L 142  140  140   Potassium 3.5 - 5.1 mmol/L 4.7  5.0  4.9   Chloride 98 - 111 mmol/L 111  111  104   CO2 22 - 32 mmol/L 24  20  22    Calcium  8.9 - 10.3 mg/dL 9.1  9.4  9.8     LFT    Latest Ref Rng & Units 07/11/2024   12:45 PM 08/17/2022   10:27 AM 08/09/2022    9:47 AM  Hepatic Function  Total Protein 6.5 - 8.1 g/dL 6.2  6.2  6.0   Albumin 3.5 - 5.0 g/dL 3.6  4.4  4.2   AST 15 - 41 U/L 20  18  17    ALT 0 - 44 U/L 17  13  17    Alk Phosphatase 38 - 126 U/L 48  77  77   Total Bilirubin 0.0 - 1.2 mg/dL 0.8  0.5  0.4      STUDIES: CT ANGIO GI BLEED Result Date: 07/12/2024 CLINICAL DATA:  Lower GI bleed EXAM: CTA ABDOMEN AND PELVIS WITHOUT AND WITH CONTRAST TECHNIQUE: Multidetector CT imaging of the abdomen and pelvis was performed using the standard protocol during bolus administration of intravenous contrast. Multiplanar reconstructed images and MIPs were obtained and reviewed to evaluate the vascular anatomy. RADIATION DOSE REDUCTION: This exam was performed according to the departmental dose-optimization program which includes automated exposure control, adjustment of the mA and/or kV according to patient size and/or  use of iterative reconstruction technique. CONTRAST:  OMNIPAQUE  IOHEXOL  350 MG/ML SOLN COMPARISON:  CTA chest 03/20/2024 FINDINGS: VASCULAR Aorta: Moderate calcified atheromatous plaque throughout, with a  1.5 cm penetrating atheromatous ulcer along the left posterolateral wall in the infrarenal segment. No aneurysm or stenosis. Celiac: Partially calcified ostial plaque resulting in mild short-segment stenosis, patent distally with classic trifurcation anatomy. SMA: Calcified ostial plaque without significant stenosis, mildly atheromatous but patent distally with classic branch anatomy. Renals: Single bilaterally, both with calcified ostial plaque resulting in at least mild short-segment stenosis, patent distally. IMA: Patent without evidence of aneurysm, dissection, vasculitis or significant stenosis. Inflow: Moderate scattered atheromatous plaque without dissection or stenosis. Fusiform 2.1 cm dilatation of the right common iliac artery, left 1.5 cm. Proximal Outflow: Mild plaque, no aneurysm, dissection, or stenosis. Veins: Patent hepatic veins, portal vein, splenic vein, SMV. Incomplete opacification of iliac venous system and IVC which appear grossly unremarkable. Review of the MIP images confirms the above findings. NON-VASCULAR Lower chest: No pleural or pericardial effusion. Coarse peripheral interstitial opacities in both lung bases right greater than left. Hepatobiliary: No focal liver abnormality is seen. Status post cholecystectomy. No biliary dilatation. Pancreas: Unremarkable. No pancreatic ductal dilatation or surrounding inflammatory changes. Spleen: Normal in size without focal abnormality. Adrenals/Urinary Tract: No adrenal mass. Symmetric renal parenchymal enhancement without hydronephrosis. Multiple cortical lesions in both kidneys, some of which can be characterized as cysts, largest 2.2 cm 15 HU right upper pole; no followup recommended. Urinary bladder is distended, with left anterolateral mass or diverticulum extending into the left inguinal hernia, containing a small punctate calcification. Stomach/Bowel: Stomach is partially distended, that acute finding. Small bowel decompressed. Normal  appendix. Colon is incompletely distended, with scattered sigmoid diverticula; no adjacent inflammatory change. No active extravasation into the lumen. Lymphatic: No abdominal or pelvic adenopathy. Reproductive: Moderate prostate enlargement. Other: No ascites.  No free air. Musculoskeletal: Mild lumbar levoscoliosis apex L2 with multilevel spondylitic changes in the lower thoracic and lumbar spine. Probable AVN in the right femoral head without subchondral collapse. IMPRESSION: 1. No evidence of active GI bleed. 2. Sigmoid diverticulosis. 3. Left anterolateral urinary bladder mass or diverticulum extending into the left inguinal hernia. Consider urologic consultation. 4. Probable AVN in the right femoral head without subchondral collapse. 5.  Aortic Atherosclerosis (ICD10-I70.0). Electronically Signed   By: JONETTA Faes M.D.   On: 07/12/2024 13:59   DG Chest Portable 1 View Result Date: 07/11/2024 CLINICAL DATA:  Shortness of breath, weakness EXAM: PORTABLE CHEST 1 VIEW COMPARISON:  March 20, 2024 and prior studies FINDINGS: Unchanged elevation of the right hemidiaphragm with right lung volume loss. Likely bibasilar scarring/atelectasis no new consolidation. Unchanged cardiomediastinal silhouette. No acute osseous findings. Radiodensity projecting over the right lung apex and possibly external to the patient. IMPRESSION: Unchanged elevation of the right hemidiaphragm. Electronically Signed   By: Michaeline Blanch M.D.   On: 07/11/2024 14:04      Impression / Plan:   Gary Mueller is a 83 y.o. male with history of coronary artery disease, NSTEMI status post PCI in 2022 on Plavix, prediabetes presented with epigastric pain, coffee-ground emesis as well as melena/hematochezia with acute blood loss anemia  Epigastric pain with coffee-ground emesis, melena/hematochezia Elevated BUN/creatinine concern for upper GI bleed, small bowel bleed or proximal colonic bleed CT angio GI bleed protocol did not reveal any  active extravasation Monitor CBC closely, every 8 hours to maintain hemoglobin greater than 8 Maintain 2 large-bore IVs Continue Protonix  40 mg IV  twice daily Recommend maintenance IV fluids to normalize BUN/creatinine ratio and adequate hydration to prevent AKI Continue clear liquids Last dose of Plavix was on 7/17 PM, given ongoing GI bleed, discussed with patient and his daughter regarding upper endoscopy and colonoscopy on day 3, off Plavix.  Also, explained to them that I will not be able to use cautery if there is an ulcer that needs to be treated, however I would be able to perform other hemostatic measures Patient is agreeable with the plan Bowel prep ordered NPO effective 5 AM tomorrow  History of coronary artery disease, worsening shortness of breath Likely in setting of anemia, however, recommend to check troponin levels given his history of coronary artery disease, if elevated, please consult cardiology   I have discussed alternative options, risks & benefits,  which include, but are not limited to, bleeding, infection, perforation,respiratory complication & drug reaction.  The patient agrees with this plan & written consent will be obtained.    Thank you for involving me in the care of this patient.  GI will follow along with you    LOS: 0 days   Gary Brooklyn, MD  07/12/2024, 4:33 PM    Note: This dictation was prepared with Dragon dictation along with smaller phrase technology. Any transcriptional errors that result from this process are unintentional.

## 2024-07-11 NOTE — ED Notes (Signed)
 See triage note  Presents with some abd pain which started 2-3 days ago No fever States he id have some vomiting today  Noticed some blood

## 2024-07-12 ENCOUNTER — Observation Stay

## 2024-07-12 DIAGNOSIS — I4819 Other persistent atrial fibrillation: Secondary | ICD-10-CM | POA: Diagnosis not present

## 2024-07-12 DIAGNOSIS — I10 Essential (primary) hypertension: Secondary | ICD-10-CM

## 2024-07-12 DIAGNOSIS — K219 Gastro-esophageal reflux disease without esophagitis: Secondary | ICD-10-CM | POA: Diagnosis present

## 2024-07-12 DIAGNOSIS — F419 Anxiety disorder, unspecified: Secondary | ICD-10-CM | POA: Diagnosis present

## 2024-07-12 DIAGNOSIS — Z885 Allergy status to narcotic agent status: Secondary | ICD-10-CM | POA: Diagnosis not present

## 2024-07-12 DIAGNOSIS — Z85828 Personal history of other malignant neoplasm of skin: Secondary | ICD-10-CM | POA: Diagnosis not present

## 2024-07-12 DIAGNOSIS — K254 Chronic or unspecified gastric ulcer with hemorrhage: Secondary | ICD-10-CM | POA: Diagnosis present

## 2024-07-12 DIAGNOSIS — Z96653 Presence of artificial knee joint, bilateral: Secondary | ICD-10-CM | POA: Diagnosis present

## 2024-07-12 DIAGNOSIS — I4891 Unspecified atrial fibrillation: Secondary | ICD-10-CM | POA: Diagnosis present

## 2024-07-12 DIAGNOSIS — K589 Irritable bowel syndrome without diarrhea: Secondary | ICD-10-CM | POA: Diagnosis present

## 2024-07-12 DIAGNOSIS — E785 Hyperlipidemia, unspecified: Secondary | ICD-10-CM | POA: Diagnosis present

## 2024-07-12 DIAGNOSIS — K922 Gastrointestinal hemorrhage, unspecified: Secondary | ICD-10-CM | POA: Diagnosis present

## 2024-07-12 DIAGNOSIS — R7303 Prediabetes: Secondary | ICD-10-CM | POA: Diagnosis present

## 2024-07-12 DIAGNOSIS — Z955 Presence of coronary angioplasty implant and graft: Secondary | ICD-10-CM | POA: Diagnosis not present

## 2024-07-12 DIAGNOSIS — D62 Acute posthemorrhagic anemia: Secondary | ICD-10-CM | POA: Diagnosis present

## 2024-07-12 DIAGNOSIS — Z8249 Family history of ischemic heart disease and other diseases of the circulatory system: Secondary | ICD-10-CM | POA: Diagnosis not present

## 2024-07-12 DIAGNOSIS — E538 Deficiency of other specified B group vitamins: Secondary | ICD-10-CM | POA: Diagnosis present

## 2024-07-12 DIAGNOSIS — I2510 Atherosclerotic heart disease of native coronary artery without angina pectoris: Secondary | ICD-10-CM | POA: Diagnosis present

## 2024-07-12 DIAGNOSIS — E039 Hypothyroidism, unspecified: Secondary | ICD-10-CM | POA: Diagnosis present

## 2024-07-12 DIAGNOSIS — Z79899 Other long term (current) drug therapy: Secondary | ICD-10-CM | POA: Diagnosis not present

## 2024-07-12 DIAGNOSIS — Z79890 Hormone replacement therapy (postmenopausal): Secondary | ICD-10-CM | POA: Diagnosis not present

## 2024-07-12 DIAGNOSIS — I252 Old myocardial infarction: Secondary | ICD-10-CM | POA: Diagnosis not present

## 2024-07-12 DIAGNOSIS — I255 Ischemic cardiomyopathy: Secondary | ICD-10-CM | POA: Diagnosis present

## 2024-07-12 DIAGNOSIS — Z7982 Long term (current) use of aspirin: Secondary | ICD-10-CM | POA: Diagnosis not present

## 2024-07-12 DIAGNOSIS — Z87891 Personal history of nicotine dependence: Secondary | ICD-10-CM | POA: Diagnosis not present

## 2024-07-12 DIAGNOSIS — Z7902 Long term (current) use of antithrombotics/antiplatelets: Secondary | ICD-10-CM | POA: Diagnosis not present

## 2024-07-12 LAB — BASIC METABOLIC PANEL WITH GFR
Anion gap: 7 (ref 5–15)
BUN: 58 mg/dL — ABNORMAL HIGH (ref 8–23)
CO2: 24 mmol/L (ref 22–32)
Calcium: 9.1 mg/dL (ref 8.9–10.3)
Chloride: 111 mmol/L (ref 98–111)
Creatinine, Ser: 1.38 mg/dL — ABNORMAL HIGH (ref 0.61–1.24)
GFR, Estimated: 50 mL/min — ABNORMAL LOW (ref >60)
Glucose, Bld: 97 mg/dL (ref 70–99)
Potassium: 4.7 mmol/L (ref 3.5–5.1)
Sodium: 142 mmol/L (ref 135–145)

## 2024-07-12 LAB — CBC
HCT: 28.9 % — ABNORMAL LOW (ref 39.0–52.0)
HCT: 28.8 % — ABNORMAL LOW (ref 39.0–52.0)
Hemoglobin: 9.5 g/dL — ABNORMAL LOW (ref 13.0–17.0)
Hemoglobin: 9.8 g/dL — ABNORMAL LOW (ref 13.0–17.0)
MCH: 31.8 pg (ref 26.0–34.0)
MCH: 32.7 pg (ref 26.0–34.0)
MCHC: 32.9 g/dL (ref 30.0–36.0)
MCHC: 34 g/dL (ref 30.0–36.0)
MCV: 96.7 fL (ref 80.0–100.0)
MCV: 96 fL (ref 80.0–100.0)
Platelets: 156 K/uL (ref 150–400)
Platelets: 144 K/uL — ABNORMAL LOW (ref 150–400)
RBC: 2.99 MIL/uL — ABNORMAL LOW (ref 4.22–5.81)
RBC: 3 MIL/uL — ABNORMAL LOW (ref 4.22–5.81)
RDW: 13.2 % (ref 11.5–15.5)
RDW: 13.2 % (ref 11.5–15.5)
WBC: 6.9 K/uL (ref 4.0–10.5)
WBC: 6.6 K/uL (ref 4.0–10.5)
nRBC: 0 % (ref 0.0–0.2)
nRBC: 0 % (ref 0.0–0.2)

## 2024-07-12 LAB — TROPONIN I (HIGH SENSITIVITY)
Troponin I (High Sensitivity): 15 ng/L (ref <18)
Troponin I (High Sensitivity): 15 ng/L (ref <18)

## 2024-07-12 LAB — PROTIME-INR
INR: 1.1 (ref 0.8–1.2)
Prothrombin Time: 14.8 s (ref 11.4–15.2)

## 2024-07-12 LAB — IRON AND TIBC
Iron: 90 ug/dL (ref 45–182)
Saturation Ratios: 29 % (ref 17.9–39.5)
TIBC: 315 ug/dL (ref 250–450)
UIBC: 225 ug/dL

## 2024-07-12 LAB — VITAMIN B12: Vitamin B-12: 260 pg/mL (ref 180–914)

## 2024-07-12 LAB — FOLATE: Folate: 26 ng/mL (ref >5.9)

## 2024-07-12 LAB — FERRITIN: Ferritin: 38 ng/mL (ref 24–336)

## 2024-07-12 MED ORDER — IOHEXOL 350 MG/ML SOLN
100.0000 mL | Freq: Once | INTRAVENOUS | Status: AC | PRN
  Administered 2024-07-12: 100 mL via INTRAVENOUS

## 2024-07-12 MED ORDER — LACTATED RINGERS IV SOLN: INTRAVENOUS | Status: DC

## 2024-07-12 MED ORDER — POLYETHYLENE GLYCOL 3350 17 GM/SCOOP PO POWD
238.0000 g | Freq: Once | ORAL | Status: AC
  Administered 2024-07-12: 238 g via ORAL
  Filled 2024-07-12: qty 238

## 2024-07-12 NOTE — Plan of Care (Signed)
  Problem: Education: Goal: Knowledge of General Education information will improve Description: Including pain rating scale, medication(s)/side effects and non-pharmacologic comfort measures Outcome: Progressing   Problem: Health Behavior/Discharge Planning: Goal: Ability to manage health-related needs will improve Outcome: Progressing   Problem: Clinical Measurements: Goal: Will remain free from infection Outcome: Progressing Goal: Respiratory complications will improve Outcome: Progressing Goal: Cardiovascular complication will be avoided Outcome: Progressing   Problem: Activity: Goal: Risk for activity intolerance will decrease Outcome: Progressing   Problem: Nutrition: Goal: Adequate nutrition will be maintained Outcome: Progressing   Problem: Coping: Goal: Level of anxiety will decrease Outcome: Progressing   Problem: Elimination: Goal: Will not experience complications related to urinary retention Outcome: Progressing   Problem: Pain Managment: Goal: General experience of comfort will improve and/or be controlled Outcome: Progressing   Problem: Safety: Goal: Ability to remain free from injury will improve Outcome: Progressing   Problem: Skin Integrity: Goal: Risk for impaired skin integrity will decrease Outcome: Progressing

## 2024-07-12 NOTE — Care Management Obs Status (Signed)
 MEDICARE OBSERVATION STATUS NOTIFICATION   Patient Details  Name: Gary Mueller MRN: [id number] Date of Birth: 1941/11/05   Medicare Observation Status Notification Given:  No (patient did not want a copy)    [name] 07/12/2024, 1:31 PM

## 2024-07-12 NOTE — Progress Notes (Signed)
 [facility]  - [place] [facility]   PATIENT NAME: Gary Mueller    MR#:  [id number]  DATE OF BIRTH:  26-Oct-1941  SUBJECTIVE:  daughter at bedside. Patient came in with coffee ground emesis and dark-colored stools for couple days. Vomiting started yesterday none today. Denies any abdominal pain.   VITALS:  Blood pressure (!) 118/59, pulse 60, temperature 98.3 F (36.8 C), temperature source Oral, resp. rate 20, height 6' (1.829 m), weight 77.1 kg, SpO2 96%.  PHYSICAL EXAMINATION:   GENERAL:  83 y.o.-year-old patient with no acute distress.  LUNGS: Normal breath sounds bilaterally, no wheezing CARDIOVASCULAR: S1, S2 normal. No murmur   ABDOMEN: Soft, nontender, nondistended. Bowel sounds present.  EXTREMITIES: No  edema b/l.    NEUROLOGIC: nonfocal  patient is alert and awake SKIN: No obvious rash, lesion, or ulcer.   LABORATORY PANEL:  CBC Recent Labs  Lab 07/12/24 0346  WBC 6.9  HGB 9.5*  HCT 28.9*  PLT 156    Chemistries  Recent Labs  Lab 07/11/24 1245 07/12/24 0346  NA 140 142  K 5.0 4.7  CL 111 111  CO2 20* 24  GLUCOSE 123* 97  BUN 47* 58*  CREATININE 1.55* 1.38*  CALCIUM  9.4 9.1  AST 20  --   ALT 17  --   ALKPHOS 48  --   BILITOT 0.8  --    Cardiac Enzymes No results for input(s): TROPONINI in the last 168 hours. RADIOLOGY:  CT ANGIO GI BLEED Result Date: 07/12/2024 CLINICAL DATA:  Lower GI bleed EXAM: CTA ABDOMEN AND PELVIS WITHOUT AND WITH CONTRAST TECHNIQUE: Multidetector CT imaging of the abdomen and pelvis was performed using the standard protocol during bolus administration of intravenous contrast. Multiplanar reconstructed images and MIPs were obtained and reviewed to evaluate the vascular anatomy. RADIATION DOSE REDUCTION: This exam was performed according to the departmental dose-optimization program which includes automated exposure control, adjustment of the mA and/or kV according to patient size and/or use of iterative  reconstruction technique. CONTRAST:  OMNIPAQUE  IOHEXOL  350 MG/ML SOLN COMPARISON:  CTA chest 03/20/2024 FINDINGS: VASCULAR Aorta: Moderate calcified atheromatous plaque throughout, with a 1.5 cm penetrating atheromatous ulcer along the left posterolateral wall in the infrarenal segment. No aneurysm or stenosis. Celiac: Partially calcified ostial plaque resulting in mild short-segment stenosis, patent distally with classic trifurcation anatomy. SMA: Calcified ostial plaque without significant stenosis, mildly atheromatous but patent distally with classic branch anatomy. Renals: Single bilaterally, both with calcified ostial plaque resulting in at least mild short-segment stenosis, patent distally. IMA: Patent without evidence of aneurysm, dissection, vasculitis or significant stenosis. Inflow: Moderate scattered atheromatous plaque without dissection or stenosis. Fusiform 2.1 cm dilatation of the right common iliac artery, left 1.5 cm. Proximal Outflow: Mild plaque, no aneurysm, dissection, or stenosis. Veins: Patent hepatic veins, portal vein, splenic vein, SMV. Incomplete opacification of iliac venous system and IVC which appear grossly unremarkable. Review of the MIP images confirms the above findings. NON-VASCULAR Lower chest: No pleural or pericardial effusion. Coarse peripheral interstitial opacities in both lung bases right greater than left. Hepatobiliary: No focal liver abnormality is seen. Status post cholecystectomy. No biliary dilatation. Pancreas: Unremarkable. No pancreatic ductal dilatation or surrounding inflammatory changes. Spleen: Normal in size without focal abnormality. Adrenals/Urinary Tract: No adrenal mass. Symmetric renal parenchymal enhancement without hydronephrosis. Multiple cortical lesions in both kidneys, some of which can be characterized as cysts, largest 2.2 cm 15 HU right upper pole; no followup recommended. Urinary bladder is distended, with  left anterolateral mass or  diverticulum extending into the left inguinal hernia, containing a small punctate calcification. Stomach/Bowel: Stomach is partially distended, that acute finding. Small bowel decompressed. Normal appendix. Colon is incompletely distended, with scattered sigmoid diverticula; no adjacent inflammatory change. No active extravasation into the lumen. Lymphatic: No abdominal or pelvic adenopathy. Reproductive: Moderate prostate enlargement. Other: No ascites.  No free air. Musculoskeletal: Mild lumbar levoscoliosis apex L2 with multilevel spondylitic changes in the lower thoracic and lumbar spine. Probable AVN in the right femoral head without subchondral collapse. IMPRESSION: 1. No evidence of active GI bleed. 2. Sigmoid diverticulosis. 3. Left anterolateral urinary bladder mass or diverticulum extending into the left inguinal hernia. Consider urologic consultation. 4. Probable AVN in the right femoral head without subchondral collapse. 5.  Aortic Atherosclerosis (ICD10-I70.0). Electronically Signed   By: JONETTA Faes M.D.   On: 07/12/2024 13:59   DG Chest Portable 1 View Result Date: 07/11/2024 CLINICAL DATA:  Shortness of breath, weakness EXAM: PORTABLE CHEST 1 VIEW COMPARISON:  March 20, 2024 and prior studies FINDINGS: Unchanged elevation of the right hemidiaphragm with right lung volume loss. Likely bibasilar scarring/atelectasis no new consolidation. Unchanged cardiomediastinal silhouette. No acute osseous findings. Radiodensity projecting over the right lung apex and possibly external to the patient. IMPRESSION: Unchanged elevation of the right hemidiaphragm. Electronically Signed   By: Michaeline Blanch M.D.   On: 07/11/2024 14:04    Assessment and Plan  Gary Mueller is a pleasant 83 y.o. male with medical history significant for CAD s/p stenting in 2023 now on Plavix, A-fib not on anticoagulation, HTN, hypothyroidism, HLD who came into the ED complaining of epigastric pain and coffee-ground emesis 4 times  today.  Patient stated that he was [place] 9:00 this morning when he started feeling dizzy and not feeling well.  He went to his car, rested for some time, he started vomiting.  In his car he vomited 3 times black coffee-ground material no food.  Then he went home and he had fourth episode of vomiting.  He called his daughter [name] who came in to help him and drove him to emergency room.   Patient has a chronic shortness of breath and he had seen cardiologist.   Coffee ground emesis suspect upper acute GI bleeding - Patient denies any NSAIDs except Plavix. - c/o dark colored stools x2 today -  Protonix  IV twice a day, type and screen, monitor CBC - GI consult with Dr. Rushie. Plans to do for G.I. endoscopy and colonoscopy tomorrow -- CT angiogram abdomen for bleed negative for acute bleed  Incidental finding of bladder mass versus diverticulum -- will have patient follow-up urology as outpatient   CAD s/p stenting - Will hold off Plavix at this point   Atrial fibrillation - Continue on metoprolol     HTN - Blood pressure is stable - Continue on amlodipine  and metoprolol    Hypothyroidism - Continue levothyroxine   Procedures: Family communication : daughter at bedside Consults : G.I. CODE STATUS: full DVT Prophylaxis : SCD Level of care: Med-Surg Status is: Observation The patient remains OBS appropriate and will d/c before 2 midnights.    TOTAL TIME TAKING CARE OF THIS PATIENT: .  >50% time spent on counselling and coordination of care  Note: This dictation was prepared with Dragon dictation along with smaller phrase technology. Any transcriptional errors that result from this process are unintentional.  Leita Blanch M.D    [facility]   CC: Primary care physician; Bertrum,  Charlie CROME, MD

## 2024-07-12 NOTE — Progress Notes (Signed)
 Pt reports having 2 BM coffee grounds appearance. MD and GI Dr made aware

## 2024-07-13 ENCOUNTER — Encounter
Admission: EM | Disposition: A | Discharge status: Home / Self Care | Payer: Self-pay | Source: Home / Self Care | Attending: Internal Medicine

## 2024-07-13 ENCOUNTER — Inpatient Hospital Stay: Admitting: Anesthesiology

## 2024-07-13 ENCOUNTER — Encounter: Payer: Self-pay | Admitting: Hospitalist

## 2024-07-13 DIAGNOSIS — I2510 Atherosclerotic heart disease of native coronary artery without angina pectoris: Secondary | ICD-10-CM | POA: Diagnosis not present

## 2024-07-13 DIAGNOSIS — I4819 Other persistent atrial fibrillation: Secondary | ICD-10-CM | POA: Diagnosis not present

## 2024-07-13 DIAGNOSIS — K254 Chronic or unspecified gastric ulcer with hemorrhage: Secondary | ICD-10-CM

## 2024-07-13 DIAGNOSIS — I10 Essential (primary) hypertension: Secondary | ICD-10-CM | POA: Diagnosis not present

## 2024-07-13 HISTORY — PX: ESOPHAGOGASTRODUODENOSCOPY: SHX5428

## 2024-07-13 HISTORY — PX: POLYPECTOMY: SHX149

## 2024-07-13 HISTORY — PX: COLONOSCOPY: SHX5424

## 2024-07-13 LAB — CBC
HCT: 30.1 % — ABNORMAL LOW (ref 39.0–52.0)
Hemoglobin: 10 g/dL — ABNORMAL LOW (ref 13.0–17.0)
MCH: 31.9 pg (ref 26.0–34.0)
MCHC: 33.2 g/dL (ref 30.0–36.0)
MCV: 96.2 fL (ref 80.0–100.0)
Platelets: 160 K/uL (ref 150–400)
RBC: 3.13 MIL/uL — ABNORMAL LOW (ref 4.22–5.81)
RDW: 13.5 % (ref 11.5–15.5)
WBC: 8.6 K/uL (ref 4.0–10.5)
nRBC: 0 % (ref 0.0–0.2)

## 2024-07-13 SURGERY — EGD (ESOPHAGOGASTRODUODENOSCOPY)
Anesthesia: General

## 2024-07-13 MED ORDER — PANTOPRAZOLE SODIUM 40 MG PO TBEC: 40.0000 mg | DELAYED_RELEASE_TABLET | Freq: Two times a day (BID) | ORAL | Status: DC

## 2024-07-13 MED ORDER — PROPOFOL 10 MG/ML IV BOLUS
INTRAVENOUS | Status: DC | PRN
Start: 2024-07-13 — End: 2024-07-13
  Administered 2024-07-13 (×2): 20 mg via INTRAVENOUS
  Administered 2024-07-13: 50 mg via INTRAVENOUS

## 2024-07-13 MED ORDER — LIDOCAINE HCL (PF) 2 % IJ SOLN
INTRAMUSCULAR | Status: AC
  Filled 2024-07-13: qty 5

## 2024-07-13 MED ORDER — GLYCOPYRROLATE 0.2 MG/ML IJ SOLN
INTRAMUSCULAR | Status: AC
  Filled 2024-07-13: qty 1

## 2024-07-13 MED ORDER — IRON SUCROSE 200 MG IVPB - SIMPLE MED
200.0000 mg | Freq: Once | Status: AC
  Administered 2024-07-13: 200 mg via INTRAVENOUS
  Filled 2024-07-13: qty 200

## 2024-07-13 MED ORDER — CYANOCOBALAMIN 1000 MCG PO TABS: 1000.0000 ug | ORAL_TABLET | Freq: Every day | ORAL | 3 refills | Status: AC

## 2024-07-13 MED ORDER — SODIUM CHLORIDE 0.9 % IV SOLN: INTRAVENOUS | Status: DC

## 2024-07-13 MED ORDER — PROPOFOL 500 MG/50ML IV EMUL
INTRAVENOUS | Status: DC | PRN
  Administered 2024-07-13: 100 ug/kg/min via INTRAVENOUS

## 2024-07-13 MED ORDER — PROPOFOL 1000 MG/100ML IV EMUL
INTRAVENOUS | Status: AC
  Filled 2024-07-13: qty 100

## 2024-07-13 MED ORDER — VITAMIN B-12 1000 MCG PO TABS
1000.0000 ug | ORAL_TABLET | Freq: Every day | ORAL | Status: DC
  Administered 2024-07-13: 1000 ug via ORAL
  Filled 2024-07-13: qty 1

## 2024-07-13 MED ORDER — PANTOPRAZOLE SODIUM 40 MG PO TBEC: 40.0000 mg | DELAYED_RELEASE_TABLET | Freq: Two times a day (BID) | ORAL | 3 refills | Status: AC

## 2024-07-13 MED ORDER — GLYCOPYRROLATE 0.2 MG/ML IJ SOLN
INTRAMUSCULAR | Status: DC | PRN
  Administered 2024-07-13: .2 mg via INTRAVENOUS

## 2024-07-13 NOTE — Anesthesia Preprocedure Evaluation (Signed)
 Anesthesia Evaluation  Patient identified by MRN, date of birth, ID band Patient awake    Reviewed: Allergy & Precautions, NPO status , Patient's Chart, lab work & pertinent test results  History of Anesthesia Complications Negative for: history of anesthetic complications  Airway Mallampati: II       Dental  (+) Dental Advidsory Given, Missing, Chipped, Teeth Intact   Pulmonary shortness of breath and with exertion, neg sleep apnea, neg COPD, neg recent URI, Not current smoker, former smoker          Cardiovascular Exercise Tolerance: Good hypertension, Pt. on medications (-) angina + CAD, + Past MI and + Cardiac Stents  (-) CHF + dysrhythmias (hx of afib) Atrial Fibrillation (-) Valvular Problems/Murmurs     Neuro/Psych neg Seizures PSYCHIATRIC DISORDERS Anxiety Depression       GI/Hepatic Neg liver ROS,GERD  Medicated and Controlled,,  Endo/Other  neg diabetesHypothyroidism    Renal/GU negative Renal ROS     Musculoskeletal   Abdominal   Peds  Hematology   Anesthesia Other Findings Past Medical History: No date: Allergy No date: Anxiety No date: Arthritis 10/20/2016: Cancer [facility])     Comment:  Basal Cell Skin Cancer; lip, neck 07/07/2019: Cystoid macular edema of left eye No date: Depression No date: Dysrhythmia     Comment:  A FIB No date: GERD (gastroesophageal reflux disease) No date: HOH (hard of hearing)     Comment:  Bilateral No date: Hyperlipidemia No date: Hypertension     Comment:  Patient denies No date: Hypothyroidism No date: IBS (irritable bowel syndrome) No date: Inguinal hernia     Comment:  bilateral No date: Pneumonia No date: Thyroid  disease No date: Tinnitus of both ears   Reproductive/Obstetrics                              Anesthesia Physical Anesthesia Plan  ASA: 3  Anesthesia Plan: General   Post-op Pain Management:    Induction:  Intravenous  PONV Risk Score and Plan: 2 and Propofol  infusion, TIVA and Treatment may vary due to age or medical condition  Airway Management Planned: Natural Airway and Nasal Cannula  Additional Equipment:   Intra-op Plan:   Post-operative Plan:   Informed Consent: I have reviewed the patients History and Physical, chart, labs and discussed the procedure including the risks, benefits and alternatives for the proposed anesthesia with the patient or authorized representative who has indicated his/her understanding and acceptance.       Plan Discussed with:   Anesthesia Plan Comments:          Anesthesia Quick Evaluation

## 2024-07-13 NOTE — Op Note (Signed)
 [facility] Patient Name: Gary Mueller Procedure Date: 07/13/2024 7:23 AM MRN: [id number] Account #: [id number] Date of Birth: 07/28/1941 Admit Type: Inpatient Age: 83 Room: [facility] ENDO ROOM 4 Gender: Male Note Status: Finalized Instrument Name: [id number] Procedure:             Colonoscopy Indications:           Hematochezia, Acute post hemorrhagic anemia Providers:             Corinn Jess Brooklyn MD, MD Referring MD:          Charlie CROME. Bertrum, MD (Referring MD) Medicines:             General Anesthesia Complications:         No immediate complications. Estimated blood loss: None. Procedure:             Pre-Anesthesia Assessment:                        - Prior to the procedure, a History and Physical was                         performed, and patient medications and allergies were                         reviewed. The patient is competent. The risks and                         benefits of the procedure and the sedation options and                         risks were discussed with the patient. All questions                         were answered and informed consent was obtained.                         Patient identification and proposed procedure were                         verified by the physician, the nurse, the                         anesthesiologist, the anesthetist and the technician                         in the pre-procedure area in the procedure room in the                         endoscopy suite. Mental Status Examination: alert and                         oriented. Airway Examination: normal oropharyngeal                         airway and neck mobility. Respiratory Examination:                         clear to auscultation. CV Examination: normal.  Prophylactic Antibiotics: The patient does not require                         prophylactic antibiotics. Prior Anticoagulants: The                          patient has taken Plavix (clopidogrel). ASA Grade                         Assessment: III - A patient with severe systemic                         disease. After reviewing the risks and benefits, the                         patient was deemed in satisfactory condition to                         undergo the procedure. The anesthesia plan was to use                         general anesthesia. Immediately prior to                         administration of medications, the patient was                         re-assessed for adequacy to receive sedatives. The                         heart rate, respiratory rate, oxygen  saturations,                         blood pressure, adequacy of pulmonary ventilation, and                         response to care were monitored throughout the                         procedure. The physical status of the patient was                         re-assessed after the procedure.                        After obtaining informed consent, the colonoscope was                         passed under direct vision. Throughout the procedure,                         the patient's blood pressure, pulse, and oxygen                          saturations were monitored continuously. The                         Colonoscope was introduced through the anus and  advanced to the the cecum, identified by appendiceal                         orifice and ileocecal valve. The colonoscopy was                         performed without difficulty. The patient tolerated                         the procedure well. The quality of the bowel                         preparation was inadequate. The ileocecal valve,                         appendiceal orifice, and rectum were photographed. Findings:      The perianal and digital rectal examinations were normal. Pertinent       negatives include normal sphincter tone and no palpable rectal lesions.      Three sessile polyps were  found in the transverse colon and ascending       colon. The polyps were 3 to 5 mm in size. These polyps were removed with       a cold snare. Resection and retrieval were complete. Estimated blood       loss: none.      Copious quantities of semi-liquid stool was found in the entire colon,       precluding visualization.      The retroflexed view of the distal rectum and anal verge was normal and       showed no anal or rectal abnormalities.      The exam was otherwise without abnormality. Impression:            - Preparation of the colon was inadequate.                        - Three 3 to 5 mm polyps in the transverse colon and                         in the ascending colon, removed with a cold snare.                         Resected and retrieved.                        - Stool in the entire examined colon.                        - The distal rectum and anal verge are normal on                         retroflexion view.                        - The examination was otherwise normal. Recommendation:        - Cardiac diet today.                        - Return patient to hospital ward for ongoing care.                        -  Resume Plavix (clopidogrel) in 5 days at prior dose.                         Refer to managing physician for further adjustment of                         therapy.                        - Ok to start aspirin  81mg  when off plavix                        - Return to my office in 2 months. Procedure Code(s):     --- Professional ---                        [phone], Colonoscopy, flexible; with removal of                         tumor(s), polyp(s), or other lesion(s) by snare                         technique Diagnosis Code(s):     --- Professional ---                        D12.3, Benign neoplasm of transverse colon (hepatic                         flexure or splenic flexure)                        K92.1, Melena (includes Hematochezia)                        D12.2,  Benign neoplasm of ascending colon                        D62, Acute posthemorrhagic anemia CPT copyright 2022 American [employer] Association. All rights reserved. The codes documented in this report are preliminary and upon coder review may  be revised to meet current compliance requirements. Dr. Corinn Brooklyn Corinn Jess Brooklyn MD, MD 07/13/2024 9:01:24 AM This report has been signed electronically. Number of Addenda: 0 Note Initiated On: 07/13/2024 7:23 AM Scope Withdrawal Time: 0 hours 8 minutes 18 seconds  Total Procedure Duration: 0 hours 10 minutes 20 seconds  Estimated Blood Loss:  Estimated blood loss: none.      [facility]

## 2024-07-13 NOTE — Progress Notes (Signed)
 Discharge instructions and med details reviewed with patient. All questions answered. Printed AVS given to patient.  2x IV removed.  Transported patient out  by wheelchair.

## 2024-07-13 NOTE — Anesthesia Postprocedure Evaluation (Signed)
 Anesthesia Post Note  Patient: Gary Mueller  Procedure(s) Performed: EGD (ESOPHAGOGASTRODUODENOSCOPY) COLONOSCOPY POLYPECTOMY, INTESTINE  Patient location during evaluation: PACU Anesthesia Type: General Level of consciousness: awake and alert Pain management: pain level controlled Vital Signs Assessment: post-procedure vital signs reviewed and stable Respiratory status: spontaneous breathing, nonlabored ventilation, respiratory function stable and patient connected to nasal cannula oxygen  Cardiovascular status: blood pressure returned to baseline and stable Postop Assessment: no apparent nausea or vomiting Anesthetic complications: no   No notable events documented.   Last Vitals:  Vitals:   07/13/24 0915 07/13/24 1017  BP: (!) 139/58 (!) 147/64  Pulse: (!) 54 (!) 56  Resp: 13 20  Temp:  36.7 C  SpO2: 97% 100%    Last Pain:  Vitals:   07/13/24 1017  TempSrc: Oral  PainSc: 0-No pain                 Prentice Murphy

## 2024-07-13 NOTE — Op Note (Signed)
 [facility] Gastroenterology Patient Name: Gary Mueller Procedure Date: 07/13/2024 7:24 AM MRN: [id number] Account #: [id number] Date of Birth: [birth year] Admit Type: Inpatient Age: 83 Room: [facility] ENDO ROOM 4 Gender: Male Note Status: Finalized Instrument Name: Upper Endoscope [id number] Procedure:             Upper GI endoscopy Indications:           Acute post hemorrhagic anemia, Coffee-ground emesis,                         Melena Providers:             Corinn Jess Brooklyn MD, MD Referring MD:          Charlie CROME. Bertrum, MD (Referring MD) Medicines:             General Anesthesia Complications:         No immediate complications. Estimated blood loss: None. Procedure:             Pre-Anesthesia Assessment:                        - Prior to the procedure, a History and Physical was                         performed, and patient medications and allergies were                         reviewed. The patient is competent. The risks and                         benefits of the procedure and the sedation options and                         risks were discussed with the patient. All questions                         were answered and informed consent was obtained.                         Patient identification and proposed procedure were                         verified by the physician, the nurse, the                         anesthesiologist, the anesthetist and the technician                         in the pre-procedure area in the procedure room in the                         endoscopy suite. Mental Status Examination: alert and                         oriented. Airway Examination: normal oropharyngeal                         airway and neck mobility. Respiratory Examination:  clear to auscultation. CV Examination: normal.                         Prophylactic Antibiotics: The patient does not require                         prophylactic  antibiotics. Prior Anticoagulants: The                         patient has taken Plavix (clopidogrel), last dose was                         3 days prior to procedure. ASA Grade Assessment: III -                         A patient with severe systemic disease. After                         reviewing the risks and benefits, the patient was                         deemed in satisfactory condition to undergo the                         procedure. The anesthesia plan was to use general                         anesthesia. Immediately prior to administration of                         medications, the patient was re-assessed for adequacy                         to receive sedatives. The heart rate, respiratory                         rate, oxygen  saturations, blood pressure, adequacy of                         pulmonary ventilation, and response to care were                         monitored throughout the procedure. The physical                         status of the patient was re-assessed after the                         procedure.                        After obtaining informed consent, the endoscope was                         passed under direct vision. Throughout the procedure,                         the patient's blood pressure, pulse, and oxygen   saturations were monitored continuously. The Endoscope                         was introduced through the mouth, and advanced to the                         second part of duodenum. The upper GI endoscopy was                         accomplished without difficulty. The patient tolerated                         the procedure well. Findings:      The duodenal bulb and second portion of the duodenum were normal.      One non-bleeding cratered gastric ulcer with a clean ulcer base (Forrest       Class III) was found in the prepyloric region of the stomach. The lesion       was 10 mm in largest dimension.      Multiple  dispersed medium erosions with no bleeding and no stigmata of       recent bleeding were found in the gastric antrum. Biopsies were taken       with a cold forceps for Helicobacter pylori testing.      The gastric body was normal. Biopsies were taken with a cold forceps for       Helicobacter pylori testing.      A small hiatal hernia was present.      The cardia and gastric fundus were normal on retroflexion.      The gastroesophageal junction and examined esophagus were normal. Impression:            - Normal duodenal bulb and second portion of the                         duodenum.                        - Non-bleeding gastric ulcer with a clean ulcer base                         (Forrest Class III).                        - Erosive gastropathy with no bleeding and no stigmata                         of recent bleeding. Biopsied.                        - Normal gastric body. Biopsied.                        - Small hiatal hernia.                        - Normal gastroesophageal junction and esophagus. Recommendation:        - Await pathology results.                        - Follow an antireflux regimen indefinitely.                        -  Use Prilosec (omeprazole ) 40 mg PO BID indefinitely.                        - Repeat upper endoscopy in 3 months to check healing. Procedure Code(s):     --- Professional ---                        [phone], Esophagogastroduodenoscopy, flexible,                         transoral; with biopsy, single or multiple Diagnosis Code(s):     --- Professional ---                        K25.9, Gastric ulcer, unspecified as acute or chronic,                         without hemorrhage or perforation                        K31.89, Other diseases of stomach and duodenum                        K44.9, Diaphragmatic hernia without obstruction or                         gangrene                        D62, Acute posthemorrhagic anemia                        K92.0,  Hematemesis                        K92.1, Melena (includes Hematochezia) CPT copyright 2022 American Medical Association. All rights reserved. The codes documented in this report are preliminary and upon coder review may  be revised to meet current compliance requirements. Dr. Corinn Brooklyn Corinn Jess Brooklyn MD, MD 07/13/2024 8:35:17 AM This report has been signed electronically. Number of Addenda: 0 Note Initiated On: 07/13/2024 7:24 AM Estimated Blood Loss:  Estimated blood loss: none.      [facility]

## 2024-07-13 NOTE — Transfer of Care (Addendum)
 Immediate Anesthesia Transfer of Care Note  Patient: Gary Mueller  Procedure(s) Performed: EGD (ESOPHAGOGASTRODUODENOSCOPY) COLONOSCOPY POLYPECTOMY, INTESTINE  Patient Location: PACU  Anesthesia Type:General  Level of Consciousness: awake alert oriented  Airway & Oxygen  Therapy: Patient Spontanous Breathing  Post-op Assessment: Report given to RN  Post vital signs: Reviewed  Last Vitals:  Vitals Value Taken Time  BP 85/52   Temp 97.81f   Pulse 58   Resp 20   SpO2 96     Last Pain:  Vitals:   07/13/24 0740  TempSrc: Temporal  PainSc: 0-No pain         Complications: No notable events documented.

## 2024-07-13 NOTE — Progress Notes (Signed)
 EGD/colonoscopy postprocedure note  EGD revealed clean-based prepyloric ulcer and gastric erosions Random gastric biopsies performed to rule out H. pylori  Colonoscopy revealed 3 small subcentimeter polyps, removed with cold snare Liquid brown stool in the colon  Recommend omeprazole  40 mg twice daily before meals as long as patient is on Plavix Okay to start aspirin  81 mg daily while patient is off Plavix Recommended to hold Plavix for next 5 days at the minimum or 4 weeks if okay with cardiology GI clinic follow-up with me in next 2 to 3 months IV iron  ordered Recommend oral B12 Resume diet GI will sign off at this time, please call us  back with questions or concerns  Corinn Brooklyn, MD

## 2024-07-13 NOTE — Discharge Summary (Signed)
 Physician Discharge Summary   Patient: Gary Mueller MRN: [id number] DOB: March 26, 1941  Admit date:     07/11/2024  Discharge date: 07/13/24  Discharge Physician: Leita Blanch   PCP: Bertrum Charlie LITTIE, MD   Recommendations at discharge: follow G.I. Dr. Unk IN one month follow-up urology Dr. Twylla regarding abnormal CT showing bladder mass versus diverticulum follow PCP in 1 to 2 weeks keep your follow-up appointment with cardiology in August   Discharge Diagnoses: Principal Problem:   GI bleeding Active Problems:   CAD in native artery   A-fib [facility])   Essential hypertension   Hypothyroidism  Gary Mueller is a pleasant 83 y.o. male with medical history significant for CAD s/p stenting in 2023 now on Plavix, A-fib not on anticoagulation, HTN, hypothyroidism, HLD who came into the ED complaining of epigastric pain and coffee-ground emesis 4 times today.  Patient stated that he [place] 9:00 this morning when he started feeling dizzy and not feeling well.  He went to his car, rested for some time, he started vomiting.  In his car he vomited 3 times black coffee-ground material no food.  Then he went home and he had fourth episode of vomiting.  He called his daughter [name] who came in to help him and drove him to emergency room.    Patient has a chronic shortness of breath and he had seen cardiologist.    Coffee ground emesis suspect upper acute GI bleeding due to Gatric ulcer (on EGD) - Patient denies any NSAIDs except Plavix. - c/o dark colored stools x2 today -  Protonix  IV twice a day, type and screen, monitor CBC - GI consult with Dr.Vanga. Plans to do for G.I. endoscopy and colonoscopy tomorrow -- CT angiogram abdomen for bleed negative for acute bleed --EGD :- Normal duodenal bulb and second portion of the                         duodenum.                        - Non-bleeding gastric ulcer with a clean ulcer base                         (Forrest Class III).                         - Erosive gastropathy with no bleeding and no stigmata                         of recent bleeding. Biopsied.                        - Normal gastric body. Biopsied.                        - Small hiatal hernia.                        - Normal gastroesophageal junction and esophagus. Recommendation:        - Await pathology results.                        - Follow an antireflux regimen indefinitely.                        -  Use Prilosec (omeprazole ) 40 mg PO BID indefinitely.                        - Repeat upper endoscopy in 3 months to check healing. Colonoscopy : - Preparation of the colon was inadequate.                        - Three 3 to 5 mm polyps in the transverse colon and                         in the ascending colon, removed with a cold snare.                         Resected and retrieved.                        - Stool in the entire examined colon.                        - The distal rectum and anal verge are normal on                         retroflexion view.                        - The examination was otherwise normal. Recommendation:        - Cardiac diet today.                        - Return patient to hospital ward for ongoing care.                        - Resume Plavix (clopidogrel) in 5 days at prior dose.                         Refer to managing physician for further adjustment of                         therapy.                        - Ok to start aspirin  81mg  when off plavix  --will place pt on HOLD for plavix till he sees Dr carnella on his upcoming appt. D/w dter [name]   Incidental finding of bladder mass versus diverticulum -- will have patient follow-up urology as outpatient--email sent to Dr Twylla for appt    CAD s/p stenting - Will hold off Plavix at this point   Atrial fibrillation - Continue on metoprolol     HTN - Blood pressure is stable - Continue on amlodipine  and metoprolol    Hypothyroidism - Continue  levothyroxine   B12 deficiency --start po b12 daily  Overall patient hemodynamically stable. Hemoglobin is 10.2. Will discharge to home and okay with G.I. with the plan. Discharge plan discussed with patient and daughter at length.   Procedures: EGD, colonoscopy Family communication : daughter at bedside Consults : G.I. CODE STATUS: full DVT Prophylaxis : SCD    Disposition: Home Diet recommendation:  Discharge Diet Orders (From admission, onward)     Start     Ordered   07/13/24 0000  Diet - low sodium heart healthy        07/13/24 1230           Cardiac diet DISCHARGE MEDICATION: Allergies as of 07/13/2024       Reactions   Codeine    GI intolerance; hallucinations    Statins    Muscle pain   Tamsulosin  Cough        Medication List     PAUSE taking these medications    clopidogrel 75 MG tablet Wait to take this until your doctor or other care provider tells you to start again. Commonly known as: PLAVIX Take 75 mg by mouth daily.       TAKE these medications    acetaminophen  500 MG tablet Commonly known as: TYLENOL  Take 1,000 mg by mouth daily as needed for moderate pain.   amLODipine  5 MG tablet Commonly known as: NORVASC  Take 1 tablet (5 mg total) by mouth daily. Please schedule office visit before any future refill.   ascorbic acid  1000 MG tablet Commonly known as: VITAMIN C Take 1 tablet by mouth daily.   cyanocobalamin  1000 MCG tablet Take 1 tablet (1,000 mcg total) by mouth daily. Start taking on: July 14, 2024   furosemide 20 MG tablet Commonly known as: LASIX Take 20 mg by mouth daily.   levothyroxine  112 MCG tablet Commonly known as: SYNTHROID  Take 1 tablet (112 mcg total) by mouth daily. What changed: Another medication with the same name was removed. Continue taking this medication, and follow the directions you see here.   losartan  50 MG tablet Commonly known as: COZAAR  TAKE ONE TABLET BY MOUTH EVERY DAY   magnesium  oxide  400 MG tablet Commonly known as: MAG-OX Take 400 mg by mouth. Take 1 tablet (400 mg total) by mouth.   metoprolol  succinate 25 MG 24 hr tablet Commonly known as: TOPROL -XL TAKE 1/2 TABLET BY MOUTH EVERY EVENING   multivitamin with minerals Tabs tablet Take 1 tablet by mouth every evening.   pantoprazole  40 MG tablet Commonly known as: PROTONIX  Take 1 tablet (40 mg total) by mouth 2 (two) times daily before a meal. What changed: See the new instructions.   pimecrolimus 1 % cream Commonly known as: ELIDEL Apply topically. Apply topically two (2) times a day.   potassium chloride 10 MEQ tablet Commonly known as: KLOR-CON Take 10 mEq by mouth daily.   rosuvastatin  40 MG tablet Commonly known as: CRESTOR  TAKE ONE TABLET BY MOUTH EVERY DAY   vitamin E  180 MG (400 UNITS) capsule Take 1 capsule by mouth daily.        Follow-up Information     Stoioff, Glendia BROCKS, MD. Schedule an appointment as soon as possible for a visit.   Specialty: Urology Why: Bladder mass vs Diverticulum evaluation Contact information: [address] [place] [address] [phone]         Bertrum Charlie CROME, MD Follow up.   Specialty: Family Medicine Contact information: [address] [place] [address] [phone]         Unk Corinn Skiff, MD. Schedule an appointment as soon as possible for a visit in 1 month(s).   Specialty: Gastroenterology Why: f/u PUD Contact information: [address] [place] [address] [phone]                Discharge Exam: Fredricka Weights   07/11/24 1235  Weight: 77.1 kg   GENERAL:  83 y.o.-year-old patient with no acute distress.  LUNGS:  Normal breath sounds bilaterally, no wheezing CARDIOVASCULAR: S1, S2 normal. No murmur   ABDOMEN: Soft, nontender, nondistended. Bowel sounds present.  EXTREMITIES: No  edema b/l.    NEUROLOGIC: nonfocal  patient is alert and awake  Condition at discharge: fair  The  results of significant diagnostics from this hospitalization (including imaging, microbiology, ancillary and laboratory) are listed below for reference.   Imaging Studies: CT ANGIO GI BLEED Result Date: 07/12/2024 CLINICAL DATA:  Lower GI bleed EXAM: CTA ABDOMEN AND PELVIS WITHOUT AND WITH CONTRAST TECHNIQUE: Multidetector CT imaging of the abdomen and pelvis was performed using the standard protocol during bolus administration of intravenous contrast. Multiplanar reconstructed images and MIPs were obtained and reviewed to evaluate the vascular anatomy. RADIATION DOSE REDUCTION: This exam was performed according to the departmental dose-optimization program which includes automated exposure control, adjustment of the mA and/or kV according to patient size and/or use of iterative reconstruction technique. CONTRAST:  OMNIPAQUE  IOHEXOL  350 MG/ML SOLN COMPARISON:  CTA chest 03/20/2024 FINDINGS: VASCULAR Aorta: Moderate calcified atheromatous plaque throughout, with a 1.5 cm penetrating atheromatous ulcer along the left posterolateral wall in the infrarenal segment. No aneurysm or stenosis. Celiac: Partially calcified ostial plaque resulting in mild short-segment stenosis, patent distally with classic trifurcation anatomy. SMA: Calcified ostial plaque without significant stenosis, mildly atheromatous but patent distally with classic branch anatomy. Renals: Single bilaterally, both with calcified ostial plaque resulting in at least mild short-segment stenosis, patent distally. IMA: Patent without evidence of aneurysm, dissection, vasculitis or significant stenosis. Inflow: Moderate scattered atheromatous plaque without dissection or stenosis. Fusiform 2.1 cm dilatation of the right common iliac artery, left 1.5 cm. Proximal Outflow: Mild plaque, no aneurysm, dissection, or stenosis. Veins: Patent hepatic veins, portal vein, splenic vein, SMV. Incomplete opacification of iliac venous system and IVC which appear  grossly unremarkable. Review of the MIP images confirms the above findings. NON-VASCULAR Lower chest: No pleural or pericardial effusion. Coarse peripheral interstitial opacities in both lung bases right greater than left. Hepatobiliary: No focal liver abnormality is seen. Status post cholecystectomy. No biliary dilatation. Pancreas: Unremarkable. No pancreatic ductal dilatation or surrounding inflammatory changes. Spleen: Normal in size without focal abnormality. Adrenals/Urinary Tract: No adrenal mass. Symmetric renal parenchymal enhancement without hydronephrosis. Multiple cortical lesions in both kidneys, some of which can be characterized as cysts, largest 2.2 cm 15 HU right upper pole; no followup recommended. Urinary bladder is distended, with left anterolateral mass or diverticulum extending into the left inguinal hernia, containing a small punctate calcification. Stomach/Bowel: Stomach is partially distended, that acute finding. Small bowel decompressed. Normal appendix. Colon is incompletely distended, with scattered sigmoid diverticula; no adjacent inflammatory change. No active extravasation into the lumen. Lymphatic: No abdominal or pelvic adenopathy. Reproductive: Moderate prostate enlargement. Other: No ascites.  No free air. Musculoskeletal: Mild lumbar levoscoliosis apex L2 with multilevel spondylitic changes in the lower thoracic and lumbar spine. Probable AVN in the right femoral head without subchondral collapse. IMPRESSION: 1. No evidence of active GI bleed. 2. Sigmoid diverticulosis. 3. Left anterolateral urinary bladder mass or diverticulum extending into the left inguinal hernia. Consider urologic consultation. 4. Probable AVN in the right femoral head without subchondral collapse. 5.  Aortic Atherosclerosis (ICD10-I70.0). Electronically Signed   By: JONETTA Faes M.D.   On: 07/12/2024 13:59   DG Chest Portable 1 View Result Date: 07/11/2024 CLINICAL DATA:  Shortness of breath, weakness EXAM:  PORTABLE CHEST 1 VIEW COMPARISON:  March 20, 2024 and prior studies FINDINGS: Unchanged elevation of the right hemidiaphragm  with right lung volume loss. Likely bibasilar scarring/atelectasis no new consolidation. Unchanged cardiomediastinal silhouette. No acute osseous findings. Radiodensity projecting over the right lung apex and possibly external to the patient. IMPRESSION: Unchanged elevation of the right hemidiaphragm. Electronically Signed   By: Michaeline Blanch M.D.   On: 07/11/2024 14:04    Microbiology: Results for orders placed or performed during the hospital encounter of 10/18/21  Resp Panel by RT-PCR (Flu A&B, Covid) Nasopharyngeal Swab     Status: None   Collection Time: 10/19/21  6:13 AM   Specimen: Nasopharyngeal Swab; Nasopharyngeal(NP) swabs in vial transport medium  Result Value Ref Range Status   SARS Coronavirus 2 by RT PCR NEGATIVE NEGATIVE Final    Comment: (NOTE) SARS-CoV-2 target nucleic acids are NOT DETECTED.  The SARS-CoV-2 RNA is generally detectable in upper respiratory specimens during the acute phase of infection. The lowest concentration of SARS-CoV-2 viral copies this assay can detect is 138 copies/mL. A negative result does not preclude SARS-Cov-2 infection and should not be used as the sole basis for treatment or other patient management decisions. A negative result may occur with  improper specimen collection/handling, submission of specimen other than nasopharyngeal swab, presence of viral mutation(s) within the areas targeted by this assay, and inadequate number of viral copies(<138 copies/mL). A negative result must be combined with clinical observations, patient history, and epidemiological information. The expected result is Negative.  Fact Sheet for Patients:  BloggerCourse.com  Fact Sheet for Healthcare Providers:  SeriousBroker.it  This test is no t yet approved or cleared by the [place]   FDA and  has been authorized for detection and/or diagnosis of SARS-CoV-2 by FDA under an Emergency Use Authorization (EUA). This EUA will remain  in effect (meaning this test can be used) for the duration of the COVID-19 declaration under Section 564(b)(1) of the Act, 21 U.S.C.section 360bbb-3(b)(1), unless the authorization is terminated  or revoked sooner.       Influenza A by PCR NEGATIVE NEGATIVE Final   Influenza B by PCR NEGATIVE NEGATIVE Final    Comment: (NOTE) The Xpert Xpress SARS-CoV-2/FLU/RSV plus assay is intended as an aid in the diagnosis of influenza from Nasopharyngeal swab specimens and should not be used as a sole basis for treatment. Nasal washings and aspirates are unacceptable for Xpert Xpress SARS-CoV-2/FLU/RSV testing.  Fact Sheet for Patients: BloggerCourse.com  Fact Sheet for Healthcare Providers: SeriousBroker.it  This test is not yet approved or cleared by the [place]  FDA and has been authorized for detection and/or diagnosis of SARS-CoV-2 by FDA under an Emergency Use Authorization (EUA). This EUA will remain in effect (meaning this test can be used) for the duration of the COVID-19 declaration under Section 564(b)(1) of the Act, 21 U.S.C. section 360bbb-3(b)(1), unless the authorization is terminated or revoked.  Performed at [facility], [address] [place], [place] [address]     Labs: CBC: Recent Labs  Lab 07/11/24 1245 07/12/24 0346 07/12/24 1627 07/12/24 2352  WBC 12.0* 6.9 6.6 8.6  HGB 11.6* 9.5* 9.8* 10.0*  HCT 35.4* 28.9* 28.8* 30.1*  MCV 97.3 96.7 96.0 96.2  PLT 172 156 144* 160   Basic Metabolic Panel: Recent Labs  Lab 07/11/24 1245 07/12/24 0346  NA 140 142  K 5.0 4.7  CL 111 111  CO2 20* 24  GLUCOSE 123* 97  BUN 47* 58*  CREATININE 1.55* 1.38*  CALCIUM  9.4 9.1   Liver Function Tests: Recent Labs  Lab 07/11/24 1245  AST 20  ALT 17   ALKPHOS 48  BILITOT 0.8  PROT 6.2*  ALBUMIN 3.6    Discharge time spent: greater than 30 minutes.  Signed: Leita Blanch, MD [facility] 07/13/2024

## 2024-07-14 ENCOUNTER — Ambulatory Visit

## 2024-07-14 NOTE — Progress Notes (Signed)
 Ciprofloxacin 250 twice daily sent in for 5 days.

## 2024-07-15 ENCOUNTER — Other Ambulatory Visit: Payer: Self-pay

## 2024-07-15 ENCOUNTER — Inpatient Hospital Stay
Admit: 2024-07-15 | Discharge: 2024-07-15 | Disposition: A | Discharge status: Home / Self Care | Attending: Internal Medicine | Admitting: Internal Medicine

## 2024-07-15 ENCOUNTER — Encounter: Payer: Self-pay | Admitting: Emergency Medicine

## 2024-07-15 ENCOUNTER — Inpatient Hospital Stay

## 2024-07-15 ENCOUNTER — Inpatient Hospital Stay
Admission: EM | Admit: 2024-07-15 | Discharge: 2024-07-17 | DRG: 394 | Disposition: A | Discharge status: Home / Self Care | Attending: Internal Medicine | Admitting: Internal Medicine

## 2024-07-15 DIAGNOSIS — K219 Gastro-esophageal reflux disease without esophagitis: Secondary | ICD-10-CM | POA: Diagnosis present

## 2024-07-15 DIAGNOSIS — K5730 Diverticulosis of large intestine without perforation or abscess without bleeding: Secondary | ICD-10-CM | POA: Diagnosis present

## 2024-07-15 DIAGNOSIS — F32A Depression, unspecified: Secondary | ICD-10-CM | POA: Diagnosis present

## 2024-07-15 DIAGNOSIS — Z96653 Presence of artificial knee joint, bilateral: Secondary | ICD-10-CM | POA: Diagnosis present

## 2024-07-15 DIAGNOSIS — E872 Acidosis, unspecified: Secondary | ICD-10-CM | POA: Diagnosis present

## 2024-07-15 DIAGNOSIS — K4090 Unilateral inguinal hernia, without obstruction or gangrene, not specified as recurrent: Secondary | ICD-10-CM | POA: Diagnosis present

## 2024-07-15 DIAGNOSIS — Z7902 Long term (current) use of antithrombotics/antiplatelets: Secondary | ICD-10-CM | POA: Diagnosis not present

## 2024-07-15 DIAGNOSIS — N179 Acute kidney failure, unspecified: Secondary | ICD-10-CM | POA: Diagnosis present

## 2024-07-15 DIAGNOSIS — F419 Anxiety disorder, unspecified: Secondary | ICD-10-CM | POA: Diagnosis present

## 2024-07-15 DIAGNOSIS — K4091 Unilateral inguinal hernia, without obstruction or gangrene, recurrent: Secondary | ICD-10-CM | POA: Diagnosis not present

## 2024-07-15 DIAGNOSIS — E785 Hyperlipidemia, unspecified: Secondary | ICD-10-CM | POA: Diagnosis present

## 2024-07-15 DIAGNOSIS — R338 Other retention of urine: Secondary | ICD-10-CM | POA: Diagnosis present

## 2024-07-15 DIAGNOSIS — R339 Retention of urine, unspecified: Secondary | ICD-10-CM

## 2024-07-15 DIAGNOSIS — Z9049 Acquired absence of other specified parts of digestive tract: Secondary | ICD-10-CM

## 2024-07-15 DIAGNOSIS — Z87891 Personal history of nicotine dependence: Secondary | ICD-10-CM

## 2024-07-15 DIAGNOSIS — D500 Iron deficiency anemia secondary to blood loss (chronic): Secondary | ICD-10-CM | POA: Diagnosis present

## 2024-07-15 DIAGNOSIS — Z961 Presence of intraocular lens: Secondary | ICD-10-CM | POA: Diagnosis present

## 2024-07-15 DIAGNOSIS — I480 Paroxysmal atrial fibrillation: Secondary | ICD-10-CM | POA: Diagnosis present

## 2024-07-15 DIAGNOSIS — Z79890 Hormone replacement therapy (postmenopausal): Secondary | ICD-10-CM

## 2024-07-15 DIAGNOSIS — R011 Cardiac murmur, unspecified: Secondary | ICD-10-CM | POA: Diagnosis present

## 2024-07-15 DIAGNOSIS — Z9841 Cataract extraction status, right eye: Secondary | ICD-10-CM

## 2024-07-15 DIAGNOSIS — I2510 Atherosclerotic heart disease of native coronary artery without angina pectoris: Secondary | ICD-10-CM | POA: Diagnosis present

## 2024-07-15 DIAGNOSIS — N182 Chronic kidney disease, stage 2 (mild): Secondary | ICD-10-CM | POA: Diagnosis present

## 2024-07-15 DIAGNOSIS — N138 Other obstructive and reflux uropathy: Secondary | ICD-10-CM | POA: Diagnosis present

## 2024-07-15 DIAGNOSIS — D519 Vitamin B12 deficiency anemia, unspecified: Secondary | ICD-10-CM | POA: Diagnosis present

## 2024-07-15 DIAGNOSIS — Z8249 Family history of ischemic heart disease and other diseases of the circulatory system: Secondary | ICD-10-CM

## 2024-07-15 DIAGNOSIS — Z85828 Personal history of other malignant neoplasm of skin: Secondary | ICD-10-CM

## 2024-07-15 DIAGNOSIS — Z885 Allergy status to narcotic agent status: Secondary | ICD-10-CM

## 2024-07-15 DIAGNOSIS — E039 Hypothyroidism, unspecified: Secondary | ICD-10-CM | POA: Diagnosis present

## 2024-07-15 DIAGNOSIS — I129 Hypertensive chronic kidney disease with stage 1 through stage 4 chronic kidney disease, or unspecified chronic kidney disease: Secondary | ICD-10-CM | POA: Diagnosis present

## 2024-07-15 DIAGNOSIS — Z888 Allergy status to other drugs, medicaments and biological substances status: Secondary | ICD-10-CM

## 2024-07-15 DIAGNOSIS — Z9842 Cataract extraction status, left eye: Secondary | ICD-10-CM

## 2024-07-15 DIAGNOSIS — Z79899 Other long term (current) drug therapy: Secondary | ICD-10-CM

## 2024-07-15 DIAGNOSIS — K589 Irritable bowel syndrome without diarrhea: Secondary | ICD-10-CM | POA: Diagnosis present

## 2024-07-15 DIAGNOSIS — Z955 Presence of coronary angioplasty implant and graft: Secondary | ICD-10-CM | POA: Diagnosis not present

## 2024-07-15 DIAGNOSIS — N401 Enlarged prostate with lower urinary tract symptoms: Secondary | ICD-10-CM | POA: Diagnosis present

## 2024-07-15 LAB — COMPREHENSIVE METABOLIC PANEL WITH GFR
ALT: 16 U/L (ref 0–44)
AST: 22 U/L (ref 15–41)
Albumin: 3.7 g/dL (ref 3.5–5.0)
Alkaline Phosphatase: 47 U/L (ref 38–126)
Anion gap: 14 (ref 5–15)
BUN: 50 mg/dL — ABNORMAL HIGH (ref 8–23)
CO2: 19 mmol/L — ABNORMAL LOW (ref 22–32)
Calcium: 9.4 mg/dL (ref 8.9–10.3)
Chloride: 105 mmol/L (ref 98–111)
Creatinine, Ser: 4.47 mg/dL — ABNORMAL HIGH (ref 0.61–1.24)
GFR, Estimated: 12 mL/min — ABNORMAL LOW (ref >60)
Glucose, Bld: 140 mg/dL — ABNORMAL HIGH (ref 70–99)
Potassium: 4 mmol/L (ref 3.5–5.1)
Sodium: 138 mmol/L (ref 135–145)
Total Bilirubin: 0.9 mg/dL (ref 0.0–1.2)
Total Protein: 5.9 g/dL — ABNORMAL LOW (ref 6.5–8.1)

## 2024-07-15 LAB — CBC
HCT: 28.5 % — ABNORMAL LOW (ref 39.0–52.0)
Hemoglobin: 9.5 g/dL — ABNORMAL LOW (ref 13.0–17.0)
MCH: 32.1 pg (ref 26.0–34.0)
MCHC: 33.3 g/dL (ref 30.0–36.0)
MCV: 96.3 fL (ref 80.0–100.0)
Platelets: 139 K/uL — ABNORMAL LOW (ref 150–400)
RBC: 2.96 MIL/uL — ABNORMAL LOW (ref 4.22–5.81)
RDW: 13.5 % (ref 11.5–15.5)
WBC: 5.1 K/uL (ref 4.0–10.5)
nRBC: 0 % (ref 0.0–0.2)

## 2024-07-15 LAB — URINALYSIS, W/ REFLEX TO CULTURE (INFECTION SUSPECTED)
Bacteria, UA: NONE SEEN
Bilirubin Urine: NEGATIVE
Glucose, UA: NEGATIVE mg/dL
Ketones, ur: NEGATIVE mg/dL
Leukocytes,Ua: NEGATIVE
Nitrite: NEGATIVE
Protein, ur: NEGATIVE mg/dL
Specific Gravity, Urine: 1.013 (ref 1.005–1.030)
Squamous Epithelial / HPF: 0 /HPF (ref 0–5)
pH: 5 (ref 5.0–8.0)

## 2024-07-15 LAB — LIPASE, BLOOD: Lipase: 34 U/L (ref 11–51)

## 2024-07-15 LAB — LACTIC ACID, PLASMA
Lactic Acid, Venous: 2.2 mmol/L (ref 0.5–1.9)
Lactic Acid, Venous: 0.7 mmol/L (ref 0.5–1.9)

## 2024-07-15 LAB — SURGICAL PATHOLOGY

## 2024-07-15 MED ORDER — MORPHINE SULFATE (PF) 4 MG/ML IV SOLN
4.0000 mg | Freq: Once | INTRAVENOUS | Status: AC
  Administered 2024-07-15: 4 mg via INTRAVENOUS
  Filled 2024-07-15: qty 1

## 2024-07-15 MED ORDER — TAMSULOSIN HCL 0.4 MG PO CAPS
0.4000 mg | ORAL_CAPSULE | Freq: Every day | ORAL | Status: DC
  Administered 2024-07-15 – 2024-07-17 (×3): 0.4 mg via ORAL
  Filled 2024-07-15 (×3): qty 1

## 2024-07-15 MED ORDER — ONDANSETRON HCL 4 MG/2ML IJ SOLN
4.0000 mg | Freq: Once | INTRAMUSCULAR | Status: AC
  Administered 2024-07-15: 4 mg via INTRAVENOUS
  Filled 2024-07-15: qty 2

## 2024-07-15 MED ORDER — LACTATED RINGERS IV BOLUS
1000.0000 mL | Freq: Once | INTRAVENOUS | Status: AC
  Administered 2024-07-15: 1000 mL via INTRAVENOUS

## 2024-07-15 MED ORDER — SODIUM CHLORIDE 0.9 % IV SOLN: INTRAVENOUS | Status: DC

## 2024-07-15 MED ORDER — ACETAMINOPHEN 325 MG PO TABS: 650.0000 mg | ORAL_TABLET | Freq: Four times a day (QID) | ORAL | Status: DC | PRN

## 2024-07-15 MED ORDER — PANTOPRAZOLE SODIUM 40 MG PO TBEC
40.0000 mg | DELAYED_RELEASE_TABLET | Freq: Two times a day (BID) | ORAL | Status: DC
  Administered 2024-07-15 – 2024-07-17 (×4): 40 mg via ORAL
  Filled 2024-07-15 (×4): qty 1

## 2024-07-15 MED ORDER — HEPARIN SODIUM (PORCINE) 5000 UNIT/ML IJ SOLN
5000.0000 [IU] | Freq: Three times a day (TID) | INTRAMUSCULAR | Status: DC
  Administered 2024-07-15 – 2024-07-17 (×6): 5000 [IU] via SUBCUTANEOUS
  Filled 2024-07-15 (×6): qty 1

## 2024-07-15 MED ORDER — TAMSULOSIN HCL 0.4 MG PO CAPS: 0.4000 mg | ORAL_CAPSULE | Freq: Every day | ORAL | Status: DC

## 2024-07-15 MED ORDER — ALBUTEROL SULFATE (2.5 MG/3ML) 0.083% IN NEBU: 2.5000 mg | INHALATION_SOLUTION | Freq: Four times a day (QID) | RESPIRATORY_TRACT | Status: DC | PRN

## 2024-07-15 MED ORDER — SODIUM CHLORIDE 0.9 % IV BOLUS
500.0000 mL | Freq: Once | INTRAVENOUS | Status: AC
  Administered 2024-07-15: 500 mL via INTRAVENOUS

## 2024-07-15 MED ORDER — SENNOSIDES-DOCUSATE SODIUM 8.6-50 MG PO TABS
1.0000 | ORAL_TABLET | Freq: Every day | ORAL | Status: DC
  Administered 2024-07-15 – 2024-07-16 (×2): 1 via ORAL
  Filled 2024-07-15 (×2): qty 1

## 2024-07-15 MED ORDER — HYDRALAZINE HCL 20 MG/ML IJ SOLN: 10.0000 mg | Freq: Four times a day (QID) | INTRAMUSCULAR | Status: DC | PRN

## 2024-07-15 MED ORDER — BISACODYL 5 MG PO TBEC: 5.0000 mg | DELAYED_RELEASE_TABLET | Freq: Every day | ORAL | Status: DC | PRN

## 2024-07-15 MED ORDER — LEVOTHYROXINE SODIUM 50 MCG PO TABS
75.0000 ug | ORAL_TABLET | Freq: Every day | ORAL | Status: DC
  Administered 2024-07-16 – 2024-07-17 (×2): 75 ug via ORAL
  Filled 2024-07-15 (×2): qty 2

## 2024-07-15 MED ORDER — VITAMIN B-12 1000 MCG PO TABS
1000.0000 ug | ORAL_TABLET | Freq: Every day | ORAL | Status: DC
  Administered 2024-07-16 – 2024-07-17 (×2): 1000 ug via ORAL
  Filled 2024-07-15 (×2): qty 1

## 2024-07-15 MED ORDER — METOPROLOL SUCCINATE ER 25 MG PO TB24
12.5000 mg | ORAL_TABLET | Freq: Every evening | ORAL | Status: DC
  Administered 2024-07-15 – 2024-07-16 (×2): 12.5 mg via ORAL
  Filled 2024-07-15 (×2): qty 1

## 2024-07-15 MED ORDER — POLYETHYLENE GLYCOL 3350 17 G PO PACK
17.0000 g | PACK | Freq: Every day | ORAL | Status: DC | PRN
Start: 2024-07-15 — End: 2024-07-17
  Administered 2024-07-17: 17 g via ORAL
  Filled 2024-07-15: qty 1

## 2024-07-15 MED ORDER — ACETAMINOPHEN 650 MG RE SUPP
650.0000 mg | Freq: Four times a day (QID) | RECTAL | Status: DC | PRN
Start: 2024-07-15 — End: 2024-07-17

## 2024-07-15 NOTE — ED Triage Notes (Signed)
 Patient to ED via POV for lower abd pain. States he is having difficulty urinating- only going in small amounts. States swelling in groin- started over night. Last BM Sunday. Dc'd from hospital Sunday for a bleeding ulcer. Pt noted to be SOB and pale. Unable to speak in full sentences.   Lactic Acid sent to lab if needed.

## 2024-07-15 NOTE — H&P (Signed)
 [facility] History and Physical  Gary Mueller FMW:[id number] DOB: [birth year] DOA: 07/15/2024 PCP: Bertrum Charlie LITTIE, MD  Presented from: Home Chief Complaint: Lower abdominal pain  History of Present Illness: Gary Mueller is a 83 y.o. male with PMH significant for HTN, HLD, CAD/stent 2023 on Plavix, A-fib not on anticoagulation, hypothyroidism, GERD, arthritis, anxiety/depression. Recently hospitalized 7/18-7/20/2025 for coffee-ground emesis, underwent EGD/colonoscopy.  Found to have nonbleeding gastric ulcer, erosive gastropathy, 3 to 5 mm polyp in the transverse colon in the ascending colon that was removed.  He was discharged home on Prilosec.  Plavix was kept on hold until seen by cardiologist as an outpatient. Patient presented to the ED this morning with complaint of lower abdominal pain, difficulty urinating.  He reports he has not been able to have a bowel movement since colonoscopy on Sunday.  He has previously had worsening lower abdominal pain.  He has been dribbling urine but not able to void completely.  He was seen at [facility] clinic yesterday.  Urinary frequency led to the suspicion of UTI and he was started on ciprofloxacin.  Symptoms continued at home and hence he presented to the ED today   In the ED, patient had temperature of 99, heart rate 60s, respiratory rate in 20s, blood pressure 133/61, breathing on room air. Patient was noted to have urinary retention.  Foley catheter inserted, more than 2.5 L drained per EDP. Labs with WBC count 5.1, hemoglobin 9.5, platelet 139, pCO2 19, BUN/creatinine 50/4.47 compared to baseline of 1.38 from 7/19, lactic acid 2.2>0.7 Urinalysis showed clear yellow urine with moderate hemoglobin, negative leukocytes, no bacteria. CT abdomen pelvis showed -Left inguinal hernia containing a portion of the urinary bladder with increasing stranding in the fat since prior exam. Recommend correlation for incarceration. Urology consultation is  recommended. -Unchanged bilateral perinephric edema. -Colonic diverticulosis without diverticulitis. -Enlarged prostate.  Hospitalist service was consulted for inpatient management  At the time of my evaluation, patient was lying on bed.  Not in distress. Alert, awake, oriented x 3.  Able to give details of his history. Accompanied by daughter at bedside. History reviewed and detailed as above. Also states he had a cardiac stent placed on October 2023 and was placed on aspirin  and Plavix.  Few months ago, aspirin  was stopped and only Plavix was continued.  Since last hospitalization, Plavix is on hold as well.   Review of Systems:  All systems were reviewed and were negative unless otherwise mentioned in the HPI   Past medical history: Past Medical History:  Diagnosis Date   Allergy    Anxiety    Arthritis    Cancer (HCC) 10/20/2016   Basal Cell Skin Cancer; lip, neck   Cystoid macular edema of left eye 07/07/2019   Depression    Dysrhythmia    A FIB   GERD (gastroesophageal reflux disease)    HOH (hard of hearing)    Bilateral   Hyperlipidemia    Hypertension    Patient denies   Hypothyroidism    IBS (irritable bowel syndrome)    Inguinal hernia    bilateral   Pneumonia    Thyroid  disease    Tinnitus of both ears     Past surgical history: Past Surgical History:  Procedure Laterality Date   ANTERIOR VITRECTOMY Left 11/28/2018   Procedure: ANTERIOR VITRECTOMY;  Surgeon: Ferol Rogue, MD;  Location: [facility] ORS;  Service: Ophthalmology;  Laterality: Left;   CARDIAC CATHETERIZATION     CATARACT EXTRACTION W/PHACO Right 10/10/2018  Procedure: CATARACT EXTRACTION PHACO AND INTRAOCULAR LENS PLACEMENT (IOC);  Surgeon: Ferol Rogue, MD;  Location: [facility] ORS;  Service: Ophthalmology;  Laterality: Right;  US  00:41 CDE 6.87 Fluid pack lot # [id number]   CATARACT EXTRACTION W/PHACO Left 11/28/2018   Procedure: CATARACT EXTRACTION PHACO AND INTRAOCULAR LENS PLACEMENT  (IOC)-LEFT;  Surgeon: Ferol Rogue, MD;  Location: [facility] ORS;  Service: Ophthalmology;  Laterality: Left;  US  00:47.7 CDE 8.92 Fluid Pack lot # [id number]   CHOLECYSTECTOMY  1968   CHONDROPLASTY Right 01/02/2018   Procedure: CHONDROPLASTY;  Surgeon: Mardee Lynwood SQUIBB, MD;  Location: [facility] ORS;  Service: Orthopedics;  Laterality: Right;   COLONOSCOPY  2014   COLONOSCOPY N/A 07/13/2024   Procedure: COLONOSCOPY;  Surgeon: Unk Corinn Skiff, MD;  Location: [facility] ENDOSCOPY;  Service: Gastroenterology;  Laterality: N/A;   CORONARY STENT INTERVENTION N/A 10/19/2021   Procedure: CORONARY STENT INTERVENTION;  Surgeon: Ammon Blunt, MD;  Location: [facility] INVASIVE CV LAB;  Service: Cardiovascular;  Laterality: N/A;   ESOPHAGOGASTRODUODENOSCOPY N/A 07/13/2024   Procedure: EGD (ESOPHAGOGASTRODUODENOSCOPY);  Surgeon: Unk Corinn Skiff, MD;  Location: [facility] ENDOSCOPY;  Service: Gastroenterology;  Laterality: N/A;   EYE SURGERY     HERNIA REPAIR Left 04/18/1996   inguinal hernia repair/ Dr Dessa   HERNIA REPAIR Right 02/26/1996   Dr Dessa   HERNIA REPAIR Left 08/07/2002   Dr Dessa   JOINT REPLACEMENT     KNEE ARTHROPLASTY Right 04/30/2020   Procedure: COMPUTER ASSISTED TOTAL KNEE ARTHROPLASTY;  Surgeon: Mardee Lynwood SQUIBB, MD;  Location: [facility] ORS;  Service: Orthopedics;  Laterality: Right;   KNEE ARTHROSCOPY Right 01/02/2018   Procedure: ARTHROSCOPY KNEE;  Surgeon: Mardee Lynwood SQUIBB, MD;  Location: [facility] ORS;  Service: Orthopedics;  Laterality: Right;   KNEE ARTHROSCOPY Left 02/14/2012   partial menisectomy and chondroplasty   KNEE ARTHROSCOPY WITH MEDIAL MENISECTOMY Right 01/02/2018   Procedure: KNEE ARTHROSCOPY WITH MEDIAL MENISECTOMY;  Surgeon: Mardee Lynwood SQUIBB, MD;  Location: [facility] ORS;  Service: Orthopedics;  Laterality: Right;   LEFT HEART CATH AND CORONARY ANGIOGRAPHY N/A 10/19/2021   Procedure: LEFT HEART CATH AND CORONARY ANGIOGRAPHY;  Surgeon: Ammon Blunt, MD;  Location: [facility] INVASIVE CV LAB;   Service: Cardiovascular;  Laterality: N/A;   LEFT HEART CATH AND CORONARY ANGIOGRAPHY Left 10/10/2022   Procedure: LEFT HEART CATH AND CORONARY ANGIOGRAPHY;  Surgeon: Ammon Blunt, MD;  Location: [facility] INVASIVE CV LAB;  Service: Cardiovascular;  Laterality: Left;   POLYPECTOMY  07/13/2024   Procedure: POLYPECTOMY, INTESTINE;  Surgeon: Unk Corinn Skiff, MD;  Location: [facility] ENDOSCOPY;  Service: Gastroenterology;;   TONSILLECTOMY     as a child   TOTAL KNEE ARTHROPLASTY Left 03/29/2015   [facility] Dr. Mardee   VASECTOMY      Social History:  reports that he quit smoking about 59 years ago. His smoking use included cigarettes. He has never used smokeless tobacco. He reports current alcohol  use. He reports that he does not use drugs.  Allergies:  Allergies  Allergen Reactions   Codeine     GI intolerance; hallucinations    Statins     Muscle pain   Tamsulosin  Cough   Codeine, Statins, and Tamsulosin    Family history:  Family History  Problem Relation Age of Onset   Heart disease Mother        A fib   Healthy Sister      Physical Exam: Vitals:   07/15/24 1100 07/15/24 1455 07/15/24 1456 07/15/24 1519  BP: (!) 116/58 (!) 106/53 130/65 (!) 130/40  Pulse: ROLLEN)  58 67  62  Resp: 20 16  18   Temp:  98.6 F (37 C)  97.8 F (36.6 C)  TempSrc:  Oral  Oral  SpO2: 100% 96%  97%  Weight:      Height:       Wt Readings from Last 3 Encounters:  07/15/24 77.1 kg  07/11/24 77.1 kg  10/10/22 74.8 kg   Body mass index is 23.06 kg/m.  General exam: Pleasant, elderly Caucasian male.  Feels better than at presentation Skin: No rashes, lesions or ulcers. HEENT: Atraumatic, normocephalic, no obvious bleeding Lungs: Clear to auscultation bilaterally,  CVS: S1, S2, ejection systolic murmur,   GI/Abd: Soft, nontender, nondistended, bowel sound present,   CNS: Alert, awake, oriented x 3 Psychiatry: Mood appropriate Extremities: No pedal edema, no calf tenderness,     ----------------------------------------------------------------------------------------------------------------------------------------- ----------------------------------------------------------------------------------------------------------------------------------------- -----------------------------------------------------------------------------------------------------------------------------------------  Assessment/Plan: Principal Problem:   Acute urinary retention  Acute urinary retention Possible incarceration of urinary bladder on left inguinal hernia Presented with lower abdominal pain, urinary tension Foley catheter inserted, more than 2.5 L drained per EDP. Urinalysis not suggestive of acute infection.  Patient reports she was started on oral ciprofloxacin yesterday.  I do not see indication at this time May have underlying BPH CT abdomen and pelvis raise suspicion of possible incarceration of the urinary bladder on the left inguinal hernia.  Also showed enlarged prostate. I have requested urology consultation to Dr. Twylla.  AKI on CKD Baseline creatinine around 1.5.  Presented with creatinine elevated at 4.47.  Postrenal etiology. Started on IV hydration with NS at 100ml/hr. hold Lasix Continue to monitor Recent Labs    07/11/24 1245 07/12/24 0346 07/15/24 1013  BUN 47* 58* 50*  CREATININE 1.55* 1.38* 4.47*  CO2 20* 24 19*   Lactic acidosis WBC count not elevated.  Urinalysis not suggestive of infection.  Lactic acid level was initially elevated over 2.2.  Rapidly improved to 0.7.  Wonder if this is related to AKI Recent Labs  Lab 07/11/24 1245 07/12/24 0346 07/12/24 1627 07/12/24 2352 07/15/24 1013 07/15/24 1054 07/15/24 1252  WBC 12.0* 6.9 6.6 8.6 5.1  --   --   LATICACIDVEN  --   --   --   --   --  2.2* 0.7    Recent GI bleeding Vitamin B12 deficiency Recently hospitalized 7/18-7/20/2025 for coffee-ground emesis, underwent EGD/colonoscopy.   Found to have nonbleeding gastric ulcer, erosive gastropathy, 3 to 5 mm polyp in the transverse colon in the ascending colon that was removed.  He was discharged home on Prilosec. Continue PPI Hemoglobin at baseline. Recent Labs    07/11/24 1245 07/12/24 0346 07/12/24 1627 07/12/24 2352 07/15/24 1013  HGB 11.6* 9.5* 9.8* 10.0* 9.5*  MCV 97.3 96.7 96.0 96.2 96.3  VITAMINB12  --   --  260  --   --   FOLATE  --  26.0  --   --   --   FERRITIN  --  38  --   --   --   TIBC  --  315  --   --   --   IRON   --  90  --   --   --    CAD/stent 2023 HLD After stent, he was was on aspirin  and Plavix.  Few months ago, aspirin  was stopped and only Plavix was continued.  Since last hospitalization, Plavix is on hold as well. Continue statin.  Systolic ejection murmur Patient seems unaware.  Obtain echo  Hypertension  PTA meds- metoprolol ,  amlodipine , Lasix,  Continue metoprolol .  Keep others on hold.  Continue to monitor blood pressure.  IV hydralazine  as needed  A-fib  Rate controlled on metoprolol   not on anticoagulation,  Hypothyroidism Continue Synthroid   Diverticulosis Schedule and as needed bowel regimen  Mobility: Encourage ambulation  Goals of care:   Code Status: Full Code    DVT prophylaxis:  heparin  injection 5,000 Units Start: 07/15/24 1400   Antimicrobials: None Fluid: NS at 100 mL/h Consultants: Urology Family Communication: daughter at bedside  Status: inpatient Level of care:  Telemetry Medical   Patient is from: home Anticipated d/c to: pending clinical course  Diet:  Diet Order             Diet regular Room service appropriate? Yes; Fluid consistency: Thin  Diet effective now                    ------------------------------------------------------------------------------------- Severity of Illness: The appropriate patient status for this patient is INPATIENT. Inpatient status is judged to be reasonable and necessary in order to provide the  required intensity of service to ensure the patient's safety. The patient's presenting symptoms, physical exam findings, and initial radiographic and laboratory data in the context of their chronic comorbidities is felt to place them at high risk for further clinical deterioration. Furthermore, it is not anticipated that the patient will be medically stable for discharge from the hospital within 2 midnights of admission.   * I certify that at the point of admission it is my clinical judgment that the patient will require inpatient hospital care spanning beyond 2 midnights from the point of admission due to high intensity of service, high risk for further deterioration and high frequency of surveillance required.* -------------------------------------------------------------------------------------  Home Meds: Prior to Admission medications   Medication Sig Start Date End Date Taking? Authorizing Provider  acetaminophen  (TYLENOL ) 500 MG tablet Take 1,000 mg by mouth daily as needed for moderate pain.    [provider]  amLODipine  (NORVASC ) 5 MG tablet Take 1 tablet (5 mg total) by mouth daily. Please schedule office visit before any future refill. 04/16/23   Simmons-Robinson, Rockie, MD  ascorbic acid  (VITAMIN C) 1000 MG tablet Take 1 tablet by mouth daily.    [provider]  clopidogrel (PLAVIX) 75 MG tablet Take 75 mg by mouth daily. 03/13/22   [provider]  cyanocobalamin  1000 MCG tablet Take 1 tablet (1,000 mcg total) by mouth daily. 07/14/24   Patel, Sona, MD  furosemide (LASIX) 20 MG tablet Take 20 mg by mouth daily. 04/22/24   [provider]  levothyroxine  (SYNTHROID ) 112 MCG tablet Take 1 tablet (112 mcg total) by mouth daily. 08/17/22   Bertrum Charlie CROME, MD  losartan  (COZAAR ) 50 MG tablet TAKE ONE TABLET BY MOUTH EVERY DAY 05/15/22   Bertrum Charlie CROME, MD  magnesium  oxide (MAG-OX) 400 MG tablet Take 400 mg by mouth. Take 1 tablet (400 mg total) by  mouth.    [provider]  metoprolol  succinate (TOPROL -XL) 25 MG 24 hr tablet TAKE 1/2 TABLET BY MOUTH EVERY EVENING 04/23/23   Simmons-Robinson, Makiera, MD  Multiple Vitamin (MULTIVITAMIN WITH MINERALS) TABS tablet Take 1 tablet by mouth every evening.     [provider]  pantoprazole  (PROTONIX ) 40 MG tablet Take 1 tablet (40 mg total) by mouth 2 (two) times daily before a meal. 07/13/24   Tobie Calix, MD  pimecrolimus (ELIDEL) 1 % cream Apply topically. Apply topically two (2) times a day.  03/11/24 03/11/25  [provider]  potassium chloride (KLOR-CON) 10 MEQ tablet Take 10 mEq by mouth daily. 03/19/24   [provider]  rosuvastatin  (CRESTOR ) 40 MG tablet TAKE ONE TABLET BY MOUTH EVERY DAY 02/06/22   Bertrum Charlie CROME, MD  vitamin E  180 MG (400 UNITS) capsule Take 1 capsule by mouth daily.    [provider]    Labs on Admission:   CBC: Recent Labs  Lab 07/11/24 1245 07/12/24 0346 07/12/24 1627 07/12/24 2352 07/15/24 1013  WBC 12.0* 6.9 6.6 8.6 5.1  HGB 11.6* 9.5* 9.8* 10.0* 9.5*  HCT 35.4* 28.9* 28.8* 30.1* 28.5*  MCV 97.3 96.7 96.0 96.2 96.3  PLT 172 156 144* 160 139*    Basic Metabolic Panel: Recent Labs  Lab 07/11/24 1245 07/12/24 0346 07/15/24 1013  NA 140 142 138  K 5.0 4.7 4.0  CL 111 111 105  CO2 20* 24 19*  GLUCOSE 123* 97 140*  BUN 47* 58* 50*  CREATININE 1.55* 1.38* 4.47*  CALCIUM  9.4 9.1 9.4    Liver Function Tests: Recent Labs  Lab 07/11/24 1245 07/15/24 1013  AST 20 22  ALT 17 16  ALKPHOS 48 47  BILITOT 0.8 0.9  PROT 6.2* 5.9*  ALBUMIN 3.6 3.7   Recent Labs  Lab 07/11/24 1245 07/15/24 1013  LIPASE 39 34   No results for input(s): AMMONIA in the last 168 hours.  Cardiac Enzymes: No results for input(s): CKTOTAL, CKMB, CKMBINDEX, TROPONINI in the last 168 hours.  BNP (last 3 results) No results for input(s): BNP in the last 8760 hours.  ProBNP (last 3 results) No results for  input(s): PROBNP in the last 8760 hours.  CBG: No results for input(s): GLUCAP in the last 168 hours.  Lipase     Component Value Date/Time   LIPASE 34 07/15/2024 1013     Urinalysis    Component Value Date/Time   COLORURINE YELLOW (A) 07/15/2024 1054   APPEARANCEUR CLEAR (A) 07/15/2024 1054   APPEARANCEUR CLEAR 03/17/2015 0928   LABSPEC 1.013 07/15/2024 1054   LABSPEC 1.016 03/17/2015 0928   PHURINE 5.0 07/15/2024 1054   GLUCOSEU NEGATIVE 07/15/2024 1054   GLUCOSEU NEGATIVE 03/17/2015 0928   HGBUR MODERATE (A) 07/15/2024 1054   BILIRUBINUR NEGATIVE 07/15/2024 1054   BILIRUBINUR NEGATIVE 03/17/2015 0928   KETONESUR NEGATIVE 07/15/2024 1054   PROTEINUR NEGATIVE 07/15/2024 1054   NITRITE NEGATIVE 07/15/2024 1054   LEUKOCYTESUR NEGATIVE 07/15/2024 1054   LEUKOCYTESUR NEGATIVE 03/17/2015 0928     Drugs of Abuse  No results found for: LABOPIA, COCAINSCRNUR, LABBENZ, AMPHETMU, THCU, LABBARB    Radiological Exams on Admission: CT ABDOMEN PELVIS WO CONTRAST Result Date: 07/15/2024 CLINICAL DATA:  Acute abdominal pain. Patient reports difficulty urinating. Swelling and groin. Recent hospital discharge. EXAM: CT ABDOMEN AND PELVIS WITHOUT CONTRAST TECHNIQUE: Multidetector CT imaging of the abdomen and pelvis was performed following the standard protocol without IV contrast. RADIATION DOSE REDUCTION: This exam was performed according to the departmental dose-optimization program which includes automated exposure control, adjustment of the mA and/or kV according to patient size and/or use of iterative reconstruction technique. COMPARISON:  GI bleeding study 07/12/2024 FINDINGS: Lower chest: Subpleural reticulation in the lung bases, right greater than left, unchanged. No confluent consolidation or pleural effusion. Hepatobiliary: Scattered punctate hepatic granuloma. No evidence of focal liver abnormality on this unenhanced exam. Cholecystectomy without biliary dilatation.  Pancreas: Unremarkable. No pancreatic ductal dilatation or surrounding inflammatory changes. Spleen: Normal in size without focal abnormality. Adrenals/Urinary Tract:  No adrenal nodule. Unchanged bilateral perinephric edema. Unchanged bilateral low-density renal lesions typical of cysts. No further follow-up imaging is recommended. The urinary bladder is decompressed by Foley catheter. Left aspect of the bladder dome versus diverticulum again extends into a left inguinal hernia. There is increasing stranding adjacent to the herniated bladder in the inguinal canal. Stomach/Bowel: Decompressed urinary bladder. No bowel obstruction or inflammation. Colonic diverticulosis without diverticulitis. Small to moderate colonic stool burden. Normal appendix visualized. Vascular/Lymphatic: Extensive aortic and branch atherosclerosis. The penetrating ulcer within the infrarenal aorta on contrast-enhanced exam is not well demonstrated in the absence of IV contrast. There is no periaortic stranding to suggest rupture. No abdominopelvic adenopathy. Reproductive: Enlarged prostate spans 5.2 cm. Other: Left inguinal hernia containing portion of the urinary bladder with increased stranding in the fat since prior exam. No ascites or free air. Postsurgical change in the right upper anterior abdominal wall. Musculoskeletal: No acute osseous finding. 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 Degenerative change in the spine. IMPRESSION: 1. Left inguinal hernia containing a portion of the urinary bladder with increasing stranding in the fat since prior exam. Recommend correlation for incarceration. Urology consultation is recommended. 2. Unchanged bilateral perinephric edema. 3. Colonic diverticulosis without diverticulitis. 4. Enlarged prostate. Aortic Atherosclerosis (ICD10-I70.0). Electronically Signed   By: Andrea Gasman M.D.   On: 07/15/2024 14:34     Signed, Chapman Rota, MD [facility] 07/15/2024

## 2024-07-15 NOTE — Consult Note (Addendum)
 Patient ID: Gary Mueller, male   DOB: Mar 05, 1941, 83 y.o.   MRN: [id number]  HPI Gary Mueller is a 83 y.o. male seen in consultation at the request of Dr. Arlice.  He does have significant history of coronary artery disease status post drug-eluting stent in 2023 on Plavix he is chronically on atrial fibrillation.  Recently was discharged from the hospital secondary to upper GI bleed and was found to have an ulcer.  His antiplatelet medicine was stopped, he did have small hiatal hernia.  Please note I have personally reviewed the EGD showing small hiatal hernia without gastric ulcer I was able gastritis. Now came back due to urinary obstruction and a Foley catheter was placed with good relief of the urinary retention.  He did have also a CT scan of the abdomen pelvis that I personally reviewed showing evidence of a left inguinal hernia with a sliding bladder component.  Of note he did have a prior history of bilateral open inguinal hernia repair several years ago by Dr. Dessa, he has had knee replacements as well.  He also had had a prior cholecystectomy. He reports bulge left inguinal area w some discomfort, .but no pain. He is an avid golfer and his mind is pristine HPI  Past Medical History:  Diagnosis Date   Allergy    Anxiety    Arthritis    Cancer (HCC) 10/20/2016   Basal Cell Skin Cancer; lip, neck   Cystoid macular edema of left eye 07/07/2019   Depression    Dysrhythmia    A FIB   GERD (gastroesophageal reflux disease)    HOH (hard of hearing)    Bilateral   Hyperlipidemia    Hypertension    Patient denies   Hypothyroidism    IBS (irritable bowel syndrome)    Inguinal hernia    bilateral   Pneumonia    Thyroid  disease    Tinnitus of both ears     Past Surgical History:  Procedure Laterality Date   ANTERIOR VITRECTOMY Left 11/28/2018   Procedure: ANTERIOR VITRECTOMY;  Surgeon: Ferol Rogue, MD;  Location: [facility];  Service: Ophthalmology;  Laterality: Left;   CARDIAC  CATHETERIZATION     CATARACT EXTRACTION W/PHACO Right 10/10/2018   Procedure: CATARACT EXTRACTION PHACO AND INTRAOCULAR LENS PLACEMENT (IOC);  Surgeon: Ferol Rogue, MD;  Location: [facility];  Service: Ophthalmology;  Laterality: Right;  US  00:41 CDE 6.87 Fluid pack lot # [id number]   CATARACT EXTRACTION W/PHACO Left 11/28/2018   Procedure: CATARACT EXTRACTION PHACO AND INTRAOCULAR LENS PLACEMENT (IOC)-LEFT;  Surgeon: Ferol Rogue, MD;  Location: [facility];  Service: Ophthalmology;  Laterality: Left;  US  00:47.7 CDE 8.92 Fluid Pack lot # [id number]   CHOLECYSTECTOMY  1968   CHONDROPLASTY Right 01/02/2018   Procedure: CHONDROPLASTY;  Surgeon: Mardee Lynwood SQUIBB, MD;  Location: [facility];  Service: Orthopedics;  Laterality: Right;   COLONOSCOPY  2014   COLONOSCOPY N/A 07/13/2024   Procedure: COLONOSCOPY;  Surgeon: Unk Corinn Skiff, MD;  Location: [facility];  Service: Gastroenterology;  Laterality: N/A;   CORONARY STENT INTERVENTION N/A 10/19/2021   Procedure: CORONARY STENT INTERVENTION;  Surgeon: Ammon Blunt, MD;  Location: [facility];  Service: Cardiovascular;  Laterality: N/A;   ESOPHAGOGASTRODUODENOSCOPY N/A 07/13/2024   Procedure: EGD (ESOPHAGOGASTRODUODENOSCOPY);  Surgeon: Unk Corinn Skiff, MD;  Location: [facility];  Service: Gastroenterology;  Laterality: N/A;   EYE SURGERY     HERNIA REPAIR Left 04/18/1996   inguinal hernia repair/ Dr Dessa  HERNIA REPAIR Right 02/26/1996   Dr Dessa   HERNIA REPAIR Left 08/07/2002   Dr Dessa   JOINT REPLACEMENT     KNEE ARTHROPLASTY Right 04/30/2020   Procedure: COMPUTER ASSISTED TOTAL KNEE ARTHROPLASTY;  Surgeon: Mardee Lynwood SQUIBB, MD;  Location: [facility];  Service: Orthopedics;  Laterality: Right;   KNEE ARTHROSCOPY Right 01/02/2018   Procedure: ARTHROSCOPY KNEE;  Surgeon: Mardee Lynwood SQUIBB, MD;  Location: [facility];  Service: Orthopedics;  Laterality: Right;   KNEE ARTHROSCOPY Left 02/14/2012   partial menisectomy and  chondroplasty   KNEE ARTHROSCOPY WITH MEDIAL MENISECTOMY Right 01/02/2018   Procedure: KNEE ARTHROSCOPY WITH MEDIAL MENISECTOMY;  Surgeon: Mardee Lynwood SQUIBB, MD;  Location: [facility];  Service: Orthopedics;  Laterality: Right;   LEFT HEART CATH AND CORONARY ANGIOGRAPHY N/A 10/19/2021   Procedure: LEFT HEART CATH AND CORONARY ANGIOGRAPHY;  Surgeon: Ammon Blunt, MD;  Location: [facility];  Service: Cardiovascular;  Laterality: N/A;   LEFT HEART CATH AND CORONARY ANGIOGRAPHY Left 10/10/2022   Procedure: LEFT HEART CATH AND CORONARY ANGIOGRAPHY;  Surgeon: Ammon Blunt, MD;  Location: [facility];  Service: Cardiovascular;  Laterality: Left;   POLYPECTOMY  07/13/2024   Procedure: POLYPECTOMY, INTESTINE;  Surgeon: Unk Corinn Skiff, MD;  Location: [facility];  Service: Gastroenterology;;   TONSILLECTOMY     as a child   TOTAL KNEE ARTHROPLASTY Left 03/29/2015   [facility] Dr. Mardee   VASECTOMY      Family History  Problem Relation Age of Onset   Heart disease Mother        A fib   Healthy Sister     Social History Social History   Tobacco Use   Smoking status: Former    Current packs/day: 0.00    Types: Cigarettes    Quit date: 12/24/1964    Years since quitting: 59.5   Smokeless tobacco: Never  Vaping Use   Vaping status: Never Used  Substance Use Topics   Alcohol  use: Yes    Comment: 0-1 beer or glass of wine monthly   Drug use: No    Allergies  Allergen Reactions   Codeine     GI intolerance; hallucinations    Statins     Muscle pain   Tamsulosin  Cough    Current Facility-Administered Medications  Medication Dose Route Frequency Provider Last Rate Last Admin   0.9 %  sodium chloride  infusion   Intravenous Continuous Dahal, Binaya, MD       acetaminophen  (TYLENOL ) tablet 650 mg  650 mg Oral Q6H PRN Dahal, Chapman, MD       Or   acetaminophen  (TYLENOL ) suppository 650 mg  650 mg Rectal Q6H PRN Dahal, Binaya, MD       albuterol   (PROVENTIL ) (2.5 MG/3ML) 0.083% nebulizer solution 2.5 mg  2.5 mg Nebulization Q6H PRN Dahal, Binaya, MD       bisacodyl  (DULCOLAX) EC tablet 5 mg  5 mg Oral Daily PRN Dahal, Chapman, MD       [START ON 07/16/2024] cyanocobalamin  (VITAMIN B12) tablet 1,000 mcg  1,000 mcg Oral Daily Dahal, Binaya, MD       heparin  injection 5,000 Units  5,000 Units Subcutaneous Q8H Dahal, Binaya, MD   5,000 Units at 07/15/24 1452   hydrALAZINE  (APRESOLINE ) injection 10 mg  10 mg Intravenous Q6H PRN Dahal, Chapman, MD       [START ON 07/16/2024] levothyroxine  (SYNTHROID ) tablet 75 mcg  75 mcg Oral QAC breakfast Dahal, Chapman, MD  metoprolol  succinate (TOPROL -XL) 24 hr tablet 12.5 mg  12.5 mg Oral QPM Dahal, Binaya, MD       pantoprazole  (PROTONIX ) EC tablet 40 mg  40 mg Oral BID AC Dahal, Binaya, MD       polyethylene glycol (MIRALAX  / GLYCOLAX ) packet 17 g  17 g Oral Daily PRN Dahal, Binaya, MD       senna-docusate (Senokot-S) tablet 1 tablet  1 tablet Oral QHS Dahal, Binaya, MD         Review of Systems Full ROS  was asked and was negative except for the information on the HPI  Physical Exam Blood pressure (!) 130/40, pulse 62, temperature 97.8 F (36.6 C), temperature source Oral, resp. rate 18, height 6' (1.829 m), weight 77.1 kg, SpO2 97%. CONSTITUTIONAL: NAD. EYES: Pupils are equal, round, Sclera are non-icteric. EARS, NOSE, MOUTH AND THROAT: The oropharynx is clear. The oral mucosa is pink and moist. Hearing is intact to voice. LYMPH NODES:  Lymph nodes in the neck are normal. RESPIRATORY:  Lungs are clear. There is normal respiratory effort, with equal breath sounds bilaterally, and without pathologic use of accessory muscles. CARDIOVASCULAR: Heart is regular without murmurs, gallops, or rubs. GI: The abdomen is  soft, nontender, and nondistended.  There is recurrent reducible left inguinal hernia.  There are no palpable masses. There is no hepatosplenomegaly.  GU: Rectal deferred.    MUSCULOSKELETAL: Normal muscle strength and tone. No cyanosis or edema.   SKIN: Turgor is good and there are no pathologic skin lesions or ulcers. NEUROLOGIC: Motor and sensation is grossly normal. Cranial nerves are grossly intact. PSYCH:  Oriented to person, place and time. Affect is normal.  Data Reviewed I have personally reviewed the patient's imaging, laboratory findings and medical records.    Assessment/Plan 89 very functional male with significant medical issues presents with urinary retention.  Inguinal hernia might be contributing to his some degree but definitely is not the culprit.  I am able to reduce the hernia.  Given his recent hospitalization I would like him to recover from all these insults before performing elective surgical intervention.  I had an extensive discussion with the patient and his daughter.  They are in agreement.  We may have to hold his Plavix for a few days if okay by cardiology.  I will be happy to see him in the outpatient setting.  No need for urgent or emergent surgical intervention at this time.  Case discussed with urology team and hospitalist team in detail I personally spent a total of 75 minutes in the care of the patient today including performing a medically appropriate exam/evaluation, counseling and educating, placing orders, referring and communicating with other health care professionals, documenting clinical information in the EHR, independently interpreting and reviewing images studies and coordinating care.    Laneta Luna, MD FACS General Surgeon 07/15/2024, 3:44 PM

## 2024-07-15 NOTE — Consult Note (Cosign Needed Addendum)
 Urology Consult  I have been asked to see the patient by Dr. Dahal, for evaluation and management of urinary retention and inguinal hernia with urinary bladder involvement.  Chief Complaint: Lower abdominal pain, difficulty urinating, groin swelling  History of Present Illness: Gary Mueller is a 83 y.o. year old male with PMH A-fib not on anticoagulation, CAD, and recent admission with upper GI bleed with incidental imaging findings of bladder mass versus diverticulum scheduled for outpatient follow-up with me who presented to the ED today with reports of difficulty urinating and constipation x 2 days.    A Foley catheter was placed in the emergency department with immediate drainage of >2551mL of urine.  CTAP without contrast on presentation notable for left inguinal hernia containing a portion of the urinary bladder with surrounding fat stranding concerning for incarceration.  Urinary bladder is decompressed with Foley catheter in place.  He also has BPH.  Admission labs notable for UA with 11-20 RBC/hpf, no pyuria, no bacteriuria, and no nitrites; creatinine 4.47 (baseline 1.3); white count 5.1; and lactate 2.2, which subsequently normalized to 0.7.  Dr. Jordis has evaluated the patient and found the hernia to be reducible.  They will plan for hernia repair on an outpatient basis.  He is accompanied today by his daughter, Gary Mueller, at the bedside.  He is s/p remote vasectomy, but otherwise has not seen a urologist.  He reports several months of increased urinary frequency, though this was not bothersome enough to seek care for it.  He denies hesitancy, weak stream, straining, or intermittency.  Foley catheter in place draining clear, yellow urine.  Past Medical History:  Diagnosis Date   Allergy    Anxiety    Arthritis    Cancer (HCC) 10/20/2016   Basal Cell Skin Cancer; lip, neck   Cystoid macular edema of left eye 07/07/2019   Depression    Dysrhythmia    A FIB   GERD  (gastroesophageal reflux disease)    HOH (hard of hearing)    Bilateral   Hyperlipidemia    Hypertension    Patient denies   Hypothyroidism    IBS (irritable bowel syndrome)    Inguinal hernia    bilateral   Pneumonia    Thyroid  disease    Tinnitus of both ears     Past Surgical History:  Procedure Laterality Date   ANTERIOR VITRECTOMY Left 11/28/2018   Procedure: ANTERIOR VITRECTOMY;  Surgeon: Ferol Rogue, MD;  Location: [facility] ORS;  Service: Ophthalmology;  Laterality: Left;   CARDIAC CATHETERIZATION     CATARACT EXTRACTION W/PHACO Right 10/10/2018   Procedure: CATARACT EXTRACTION PHACO AND INTRAOCULAR LENS PLACEMENT (IOC);  Surgeon: Ferol Rogue, MD;  Location: [facility];  Service: Ophthalmology;  Laterality: Right;  US  00:41 CDE 6.87 Fluid pack lot # [id number]   CATARACT EXTRACTION W/PHACO Left 11/28/2018   Procedure: CATARACT EXTRACTION PHACO AND INTRAOCULAR LENS PLACEMENT (IOC)-LEFT;  Surgeon: Ferol Rogue, MD;  Location: [facility];  Service: Ophthalmology;  Laterality: Left;  US  00:47.7 CDE 8.92 Fluid Pack lot # [id number]   CHOLECYSTECTOMY  1968   CHONDROPLASTY Right 01/02/2018   Procedure: CHONDROPLASTY;  Surgeon: Mardee Lynwood SQUIBB, MD;  Location: [facility];  Service: Orthopedics;  Laterality: Right;   COLONOSCOPY  2014   COLONOSCOPY N/A 07/13/2024   Procedure: COLONOSCOPY;  Surgeon: Unk Corinn Skiff, MD;  Location: [facility] ENDOSCOPY;  Service: Gastroenterology;  Laterality: N/A;   CORONARY STENT INTERVENTION N/A 10/19/2021   Procedure: CORONARY STENT INTERVENTION;  Surgeon: Ammon Blunt,  MD;  Location: [facility] INV[facility]IVE [facility];  Service: Cardiovascular;  Laterality: N/A;   ESOPHAGOGASTRODUODENOSCOPY N/A 07/13/2024   Procedure: EGD (ESOPHAGOGASTRODUODENOSCOPY);  Surgeon: Unk Corinn Skiff, MD;  Location[facility] ENDOSCOPY;  Service: Gastroenterology;  Laterality: N/A;   EYE SURGERY     HERNIA REPAIR Left 04/18/1996   inguinal hernia repair/ Dr Dessa   HERNIA REPAIR Right  02/26/1996   Dr Dessa   HERNIA REPAIR Left 08/07/2002   Dr Dessa   JOINT REPLACEMENT     KNEE ARTHROPLASTY Right 04/30/2020   Procedure: COMPUTER ASSISTED TOTAL KNEE ARTHROPLASTY;  Surgeon: Mardee Lynwood SQUIBB, MD;  Location: [facility];  Service: Orthopedics;  Laterality: Right;   KNEE ARTHROSCOPY Right 01/02/2018   Procedure: ARTHROSCOPY KNEE;  Surgeon: Mardee Lynwood SQUIBB, MD;  Location: [facility] OR[facility];  Service: Orthopedics;  Laterality: Right;   KNEE ARTHROSCOPY Left 02/14/2012   partial menisectomy and chondroplasty   KNEE ARTHROSCOPY WITH MEDIAL MENISECTOMY Right 01/02/2018   Procedure: KNEE ARTHROSCOPY WITH MEDIAL MENISECTOMY;  Surgeon: Mardee Lynwood SQUIBB, MD;  Location: [facility];  Service: Orthopedics;  Laterality: Right;   LEFT HEART CATH AND CORONARY ANGIOGRAPHY N/A 10/19/2021   Procedure: LEFT HEART CATH AND CORONARY ANGIOGRAPHY;  Surgeon: Ammon Blunt, MD;  Location: [facility];  Service: Cardiovascular;  Laterality: N/A;   LEFT HEART CATH AND CORONARY ANGIOGRAPHY Left 10/10/2022   Procedure: LEFT HEART CATH AND CORONARY ANGIOGRAPHY;  Surgeon: Ammon Blunt, MD;  Location: [facility];  Service: Cardiovascular;  Laterality: Left;   POLYPECTOMY  07/13/2024   Procedure: POLYPECTOMY, INTESTINE;  Surgeon: Unk Corinn Skiff, MD;  Location: [facility] ENDOSCOPY;  Service: Gastroenterology;;   TONSILLECTOMY     as a child   TOTAL KNEE ARTHROPLASTY Left 03/29/2015   [facility] Dr. Hooten   VASECTOMY      Home Medications:  Current Meds  Medication Sig   acetaminophen  (TYLENOL ) 500 MG tablet Take 1,000 mg by mouth daily as needed for moderate pain.   amLODipine  (NORVASC ) 5 MG tablet Take 1 tablet (5 mg total) by mouth daily. Please schedule office visit before any future refill.   cyanocobalamin  1000 MCG tablet Take 1 tablet (1,000 mcg total) by mouth daily.   furosemide (LASIX) 20 MG tablet Take 20 mg by mouth daily.   levothyroxine  (SYNTHROID ) 75 MCG tablet Take 75 mcg by  mouth daily before breakfast.   losartan  (COZAAR ) 50 MG tablet TAKE ONE TABLET BY MOUTH EVERY DAY   metoprolol  succinate (TOPROL -XL) 25 MG 24 hr tablet TAKE 1/2 TABLET BY MOUTH EVERY EVENING   Multiple Vitamin (MULTIVITAMIN WITH MINERALS) TABS tablet Take 1 tablet by mouth every evening.    pantoprazole  (PROTONIX ) 40 MG tablet Take 1 tablet (40 mg total) by mouth 2 (two) times daily before a meal.   potassium chloride (KLOR-CON) 10 MEQ tablet Take 10 mEq by mouth daily.   rosuvastatin  (CRESTOR ) 40 MG tablet TAKE ONE TABLET BY MOUTH EVERY DAY    Allergies:  Allergies  Allergen Reactions   Codeine     GI intolerance; hallucinations    Statins     Muscle pain   Tamsulosin  Cough    Family History  Problem Relation Age of Onset   Heart disease Mother        A fib   Healthy Sister     Social History:  reports that he quit smoking about 59 years ago. His smoking use included cigarettes. He has never used smokeless tobacco. He reports current alcohol  use. He reports  that he does not use drugs.  ROS: A complete review of systems was performed.  All systems are negative except for pertinent findings as noted.  Physical Exam:  Vital signs in last 24 hours: Temp:  [97.8 F (36.6 C)-99 F (37.2 C)] 97.8 F (36.6 C) (07/22 1519) Pulse Rate:  [58-67] 62 (07/22 1519) Resp:  [16-26] 18 (07/22 1519) BP: (106-133)/(40-65) 130/40 (07/22 1519) SpO2:  [96 %-100 %] 97 % (07/22 1519) Weight:  [77.1 kg] 77.1 kg (07/22 1009) Constitutional:  Alert and oriented, no acute distress HEENT: [place] AT, moist mucus membranes Cardiovascular: No clubbing, cyanosis, or edema Respiratory: Normal respiratory effort GI: Abdomen is soft.  Slight tenderness at the site of his left inguinal hernia, which is reduced. Skin: No rashes, bruises or suspicious lesions Neurologic: Grossly intact, no focal deficits, moving all 4 extremities Psychiatric: Normal mood and affect  Laboratory Data:  Recent Labs     07/12/24 2352 07/15/24 1013  WBC 8.6 5.1  HGB 10.0* 9.5*  HCT 30.1* 28.5*   Recent Labs    07/15/24 1013  NA 138  K 4.0  CL 105  CO2 19*  GLUCOSE 140*  BUN 50*  CREATININE 4.47*  CALCIUM  9.4   Urinalysis    Component Value Date/Time   COLORURINE YELLOW (A) 07/15/2024 1054   APPEARANCEUR CLEAR (A) 07/15/2024 1054   APPEARANCEUR CLEAR 03/17/2015 0928   LABSPEC 1.013 07/15/2024 1054   LABSPEC 1.016 03/17/2015 0928   PHURINE 5.0 07/15/2024 1054   GLUCOSEU NEGATIVE 07/15/2024 1054   GLUCOSEU NEGATIVE 03/17/2015 0928   HGBUR MODERATE (A) 07/15/2024 1054   BILIRUBINUR NEGATIVE 07/15/2024 1054   BILIRUBINUR NEGATIVE 03/17/2015 0928   KETONESUR NEGATIVE 07/15/2024 1054   PROTEINUR NEGATIVE 07/15/2024 1054   NITRITE NEGATIVE 07/15/2024 1054   LEUKOCYTESUR NEGATIVE 07/15/2024 1054   LEUKOCYTESUR NEGATIVE 03/17/2015 0928   Results for orders placed or performed during the hospital encounter of 10/18/21  Resp Panel by RT-PCR (Flu A&B, Covid) Nasopharyngeal Swab     Status: None   Collection Time: 10/19/21  6:13 AM   Specimen: Nasopharyngeal Swab; Nasopharyngeal(NP) swabs in vial transport medium  Result Value Ref Range Status   SARS Coronavirus 2 by RT PCR NEGATIVE NEGATIVE Final    Comment: (NOTE) SARS-CoV-2 target nucleic acids are NOT DETECTED.  The SARS-CoV-2 RNA is generally detectable in upper respiratory specimens during the acute phase of infection. The lowest concentration of SARS-CoV-2 viral copies this assay can detect is 138 copies/mL. A negative result does not preclude SARS-Cov-2 infection and should not be used as the sole basis for treatment or other patient management decisions. A negative result may occur with  improper specimen collection/handling, submission of specimen other than nasopharyngeal swab, presence of viral mutation(s) within the areas targeted by this assay, and inadequate number of viral copies(<138 copies/mL). A negative result must be  combined with clinical observations, patient history, and epidemiological information. The expected result is Negative.  Fact Sheet for Patients:  BloggerCourse.com  Fact Sheet for Healthcare Providers:  SeriousBroker.it  This test is no t yet approved or cleared by the [place]  FDA and  has been authorized for detection and/or diagnosis of SARS-CoV-2 by FDA under an Emergency Use Authorization (EUA). This EUA will remain  in effect (meaning this test can be used) for the duration of the COVID-19 declaration under Section 564(b)(1) of the Act, 21 U.S.C.section 360bbb-3(b)(1), unless the authorization is terminated  or revoked sooner.       Influenza A  by PCR NEGATIVE NEGATIVE Final   Influenza B by PCR NEGATIVE NEGATIVE Final    Comment: (NOTE) The Xpert Xpress SARS-CoV-2/FLU/RSV plus assay is intended as an aid in the diagnosis of influenza from Nasopharyngeal swab specimens and should not be used as a sole basis for treatment. Nasal washings and aspirates are unacceptable for Xpert Xpress SARS-CoV-2/FLU/RSV testing.  Fact Sheet for Patients: BloggerCourse.com  Fact Sheet for Healthcare Providers: SeriousBroker.it  This test is not yet approved or cleared by the [place]  FDA and has been authorized for detection and/or diagnosis of SARS-CoV-2 by FDA under an Emergency Use Authorization (EUA). This EUA will remain in effect (meaning this test can be used) for the duration of the COVID-19 declaration under Section 564(b)(1) of the Act, 21 U.S.C. section 360bbb-3(b)(1), unless the authorization is terminated or revoked.  Performed at [facility], [address], [place], [place] [address]     Radiologic Imaging: CT ABDOMEN PELVIS WO CONTRAST Result Date: 07/15/2024 CLINICAL DATA:  Acute abdominal pain. Patient reports difficulty urinating. Swelling and  groin. Recent hospital discharge. EXAM: CT ABDOMEN AND PELVIS WITHOUT CONTRAST TECHNIQUE: Multidetector CT imaging of the abdomen and pelvis was performed following the standard protocol without IV contrast. RADIATION DOSE REDUCTION: This exam was performed according to the departmental dose-optimization program which includes automated exposure control, adjustment of the mA and/or kV according to patient size and/or use of iterative reconstruction technique. COMPARISON:  GI bleeding study 07/12/2024 FINDINGS: Lower chest: Subpleural reticulation in the lung bases, right greater than left, unchanged. No confluent consolidation or pleural effusion. Hepatobiliary: Scattered punctate hepatic granuloma. No evidence of focal liver abnormality on this unenhanced exam. Cholecystectomy without biliary dilatation. Pancreas: Unremarkable. No pancreatic ductal dilatation or surrounding inflammatory changes. Spleen: Normal in size without focal abnormality. Adrenals/Urinary Tract: No adrenal nodule. Unchanged bilateral perinephric edema. Unchanged bilateral low-density renal lesions typical of cysts. No further follow-up imaging is recommended. The urinary bladder is decompressed by Foley catheter. Left aspect of the bladder dome versus diverticulum again extends into a left inguinal hernia. There is increasing stranding adjacent to the herniated bladder in the inguinal canal. Stomach/Bowel: Decompressed urinary bladder. No bowel obstruction or inflammation. Colonic diverticulosis without diverticulitis. Small to moderate colonic stool burden. Normal appendix visualized. Vascular/Lymphatic: Extensive aortic and branch atherosclerosis. The penetrating ulcer within the infrarenal aorta on contrast-enhanced exam is not well demonstrated in the absence of IV contrast. There is no periaortic stranding to suggest rupture. No abdominopelvic adenopathy. Reproductive: Enlarged prostate spans 5.2 cm. Other: Left inguinal hernia  containing portion of the urinary bladder with increased stranding in the fat since prior exam. No ascites or free air. Postsurgical change in the right upper anterior abdominal wall. Musculoskeletal: No acute osseous finding. 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 Degenerative change in the spine. IMPRESSION: 1. Left inguinal hernia containing a portion of the urinary bladder with increasing stranding in the fat since prior exam. Recommend correlation for incarceration. Urology consultation is recommended. 2. Unchanged bilateral perinephric edema. 3. Colonic diverticulosis without diverticulitis. 4. Enlarged prostate. Aortic Atherosclerosis (ICD10-I70.0). Electronically Signed   By: Andrea Gasman M.D.   On: 07/15/2024 14:34   Assessment & Plan:  83 y.o. male with PMH CAD, A-fib, BPH, and recent upper GI bleed with possible bladder mass versus diverticulum awaiting outpatient urology follow-up, now admitted with acute urinary retention with AKI and reducible left inguinal hernia with bladder involvement.  Urinary retention likely multifactorial in the setting of constipation, BPH, and  recent hospitalization.  Question prodrome of incomplete bladder emptying given reports of increased urinary frequency in the past several months.  Hernia is reduced this afternoon, no evidence of incarceration.  I think his urinary retention distended his bladder and caused temporary incarceration, however Foley placement has resolved this.  No indication for urgent urologic intervention at this time.  Agree with Foley catheter for maximum urinary decompression.  Will plan to keep his catheter in place until he undergoes outpatient hernia repair with Dr. Jordis.  Will keep plans for outpatient follow-up with me; he will ultimately require cystoscopy for further evaluation of bladder tumor versus diverticulum.  Recommendations: - Trend BMP - Start Flomax  0.4 mg daily - Continue Foley  catheter; please give Foley catheter teaching, okay to have a leg bag - Appreciate general surgery recommendations - Outpatient follow-up with me next month as scheduled  Thank you for involving me in this patient's care, please page with any further questions or concerns.  Lucie Hones, PA-C 07/15/2024 5:10 PM

## 2024-07-15 NOTE — ED Provider Notes (Signed)
 [facility] Provider Note    Event Date/Time   First MD Initiated Contact with Patient 07/15/24 1019     (approximate)   History   Chief Complaint: Abdominal Pain   HPI  Gary Mueller is a 83 y.o. male with history of atrial fibrillation, GERD, bleeding peptic ulcer who was brought to the ED due to diffuse lower abdominal pain which is severe, started about 2 hours after his recent upper and lower endoscopy in the hospital.  Also complains of pain and swelling of the groin and penis and difficulty urinating, minimal urine output over the last 12 to 16 hours.        Past Medical History:  Diagnosis Date   Allergy    Anxiety    Arthritis    Cancer (HCC) 10/20/2016   Basal Cell Skin Cancer; lip, neck   Cystoid macular edema of left eye 07/07/2019   Depression    Dysrhythmia    A FIB   GERD (gastroesophageal reflux disease)    HOH (hard of hearing)    Bilateral   Hyperlipidemia    Hypertension    Patient denies   Hypothyroidism    IBS (irritable bowel syndrome)    Inguinal hernia    bilateral   Pneumonia    Thyroid  disease    Tinnitus of both ears     Current Outpatient Rx   Order #: [id number] Class: Historical Med   Order #: [id number] Class: Normal   Order #: [id number] Class: Historical Med   [Paused] Order #: [id number] Class: Historical Med   Order #: [id number] Class: Normal   Order #: [id number] Class: Historical Med   Order #: [id number] Class: Normal   Order #: [id number] Class: Normal   Order #: [id number] Class: Historical Med   Order #: [id number] Class: Normal   Order #: [id number] Class: Historical Med   Order #: [id number] Class: Normal   Order #: [id number] Class: Historical Med   Order #: [id number] Class: Historical Med   Order #: [id number] Class: Normal   Order #: [id number] Class: Historical Med    Past Surgical History:  Procedure Laterality Date   ANTERIOR VITRECTOMY Left 11/28/2018   Procedure: ANTERIOR VITRECTOMY;  Surgeon: Ferol Rogue, MD;  Location: [facility];  Service: Ophthalmology;  Laterality: Left;   CARDIAC CATHETERIZATION     CATARACT EXTRACTION W/PHACO Right 10/10/2018   Procedure: CATARACT EXTRACTION PHACO AND INTRAOCULAR LENS PLACEMENT (IOC);  Surgeon: Ferol Rogue, MD;  Location: [facility];  Service: Ophthalmology;  Laterality: Right;  US  00:41 CDE 6.87 Fluid pack lot # [id number]   CATARACT EXTRACTION W/PHACO Left 11/28/2018   Procedure: CATARACT EXTRACTION PHACO AND INTRAOCULAR LENS PLACEMENT (IOC)-LEFT;  Surgeon: Ferol Rogue, MD;  Location: [facility];  Service: Ophthalmology;  Laterality: Left;  US  00:47.7 CDE 8.92 Fluid Pack lot # [id number]   CHOLECYSTECTOMY  1968   CHONDROPLASTY Right 01/02/2018   Procedure: CHONDROPLASTY;  Surgeon: Mardee Lynwood SQUIBB, MD;  Location: [facility];  Service: Orthopedics;  Laterality: Right;   COLONOSCOPY  2014   COLONOSCOPY N/A 07/13/2024   Procedure: COLONOSCOPY;  Surgeon: Unk Corinn Skiff, MD;  Location: [facility] ENDOSCOPY;  Service: Gastroenterology;  Laterality: N/A;   CORONARY STENT INTERVENTION N/A 10/19/2021   Procedure: CORONARY STENT INTERVENTION;  Surgeon: Ammon Blunt, MD;  Location: [facility];  Service: Cardiovascular;  Laterality: N/A;   ESOPHAGOGASTRODUODENOSCOPY N/A 07/13/2024   Procedure: EGD (ESOPHAGOGASTRODUODENOSCOPY);  Surgeon: Unk Corinn Skiff, MD;  Location: [facility] ENDOSCOPY;  Service: Gastroenterology;  Laterality: N/A;   EYE SURGERY     HERNIA  REPAIR Left 04/18/1996   inguinal hernia repair/ Dr Dessa   HERNIA REPAIR Right 02/26/1996   Dr Dessa   HERNIA REPAIR Left 08/07/2002   Dr Dessa   JOINT REPLACEMENT     KNEE ARTHROPLASTY Right 04/30/2020   Procedure: COMPUTER ASSISTED TOTAL KNEE ARTHROPLASTY;  Surgeon: Mardee Lynwood SQUIBB, MD;  Location: [facility];  Service: Orthopedics;  Laterality: Right;   KNEE ARTHROSCOPY Right 01/02/2018   Procedure: ARTHROSCOPY KNEE;  Surgeon: Mardee Lynwood SQUIBB, MD;  Location: [facility];  Service: Orthopedics;   Laterality: Right;   KNEE ARTHROSCOPY Left 02/14/2012   partial menisectomy and chondroplasty   KNEE ARTHROSCOPY WITH MEDIAL MENISECTOMY Right 01/02/2018   Procedure: KNEE ARTHROSCOPY WITH MEDIAL MENISECTOMY;  Surgeon: Mardee Lynwood SQUIBB, MD;  Location: [facility];  Service: Orthopedics;  Laterality: Right;   LEFT HEART CATH AND CORONARY ANGIOGRAPHY N/A 10/19/2021   Procedure: LEFT HEART CATH AND CORONARY ANGIOGRAPHY;  Surgeon: Ammon Blunt, MD;  Location: [facility];  Service: Cardiovascular;  Laterality: N/A;   LEFT HEART CATH AND CORONARY ANGIOGRAPHY Left 10/10/2022   Procedure: LEFT HEART CATH AND CORONARY ANGIOGRAPHY;  Surgeon: Ammon Blunt, MD;  Location: [facility];  Service: Cardiovascular;  Laterality: Left;   POLYPECTOMY  07/13/2024   Procedure: POLYPECTOMY, INTESTINE;  Surgeon: Unk Corinn Skiff, MD;  Location: [facility] END[facility]COPY;  Service: Gastroenterology;;   TONSILLECTOMY     as a child   TOTAL KNEE ARTHROPLASTY Left 03/29/2015   [facility] Dr. Mardee   VASECTOMY      Physical Exam   Triage Vital Signs: ED Triage Vitals  Encounter Vitals Group     BP 07/15/24 1008 133/61     Girls Systolic BP Percentile --      Girls Diastolic BP Percentile --      Boys Systolic BP Percentile --      Boys Diastolic BP Percentile --      Pulse Rate 07/15/24 1008 65     Resp 07/15/24 1008 17     Temp 07/15/24 1008 99 F (37.2 C)     Temp Source 07/15/24 1008 Oral     SpO2 07/15/24 1008 100 %     Weight 07/15/24 1009 170 lb (77.1 kg)     Height 07/15/24 1009 6' (1.829 m)     Head Circumference --      Peak Flow --      Pain Score 07/15/24 1009 9     Pain Loc --      Pain Education --      Exclude from Growth Chart --     Most recent vital signs: Vitals:   07/15/24 1010 07/15/24 1100  BP:  (!) 116/58  Pulse:  (!) 58  Resp: (!) 26 20  Temp:    SpO2:  100%    General: Awake, mild distress CV:  Good peripheral perfusion.  Regular rate and  rhythm Resp:  Normal effort.  Clear to auscultation bilaterally Abd:  No distention.  Guarding, diffuse tenderness Other:  Moist oral mucosa.  No conjunctival pallor   ED Results / Procedures / Treatments   Labs (all labs ordered are listed, but only abnormal results are displayed) Labs Reviewed  COMPREHENSIVE METABOLIC PANEL WITH GFR - Abnormal; Notable for the following components:      Result Value   CO2 19 (*)    Glucose, Bld 140 (*)    BUN 50 (*)    Creatinine, Ser 4.47 (*)    Total Protein 5.9 (*)  GFR, Estimated 12 (*)    All other components within normal limits  CBC - Abnormal; Notable for the following components:   RBC 2.96 (*)    Hemoglobin 9.5 (*)    HCT 28.5 (*)    Platelets 139 (*)    All other components within normal limits  URINALYSIS, W/ REFLEX TO CULTURE (INFECTION SUSPECTED) - Abnormal; Notable for the following components:   Color, Urine YELLOW (*)    APPearance CLEAR (*)    Hgb urine dipstick MODERATE (*)    All other components within normal limits  LACTIC ACID, PLASMA - Abnormal; Notable for the following components:   Lactic Acid, Venous 2.2 (*)    All other components within normal limits  LIPASE, BLOOD  LACTIC ACID, PLASMA     EKG Interpreted by me Normal sinus rhythm rate of 67.  Left axis, right bundle branch block.  No acute ischemic changes.   RADIOLOGY CT abdomen pelvis pending    PROCEDURES:  Procedures   MEDICATIONS ORDERED IN ED: Medications  sodium chloride  0.9 % bolus 500 mL (0 mLs Intravenous Stopped 07/15/24 1150)  ondansetron  (ZOFRAN ) injection 4 mg (4 mg Intravenous Given 07/15/24 1044)  morphine  (PF) 4 MG/ML injection 4 mg (4 mg Intravenous Given 07/15/24 1045)  lactated ringers  bolus 1,000 mL (1,000 mLs Intravenous New Bag/Given 07/15/24 1316)     IMPRESSION / MDM / ASSESSMENT AND PLAN / ED COURSE  I reviewed the triage vital signs and the nursing notes.  DDx: Ileus, bowel obstruction, bowel perforation,  intra-abdominal abscess, incarcerated hernia, urinary retention, cystitis, pyelonephritis  Patient's presentation is most consistent with acute presentation with potential threat to life or bodily function.  Patient presents with severe abdominal pain with tenderness and guarding after recent endoscopies.  Vital signs unremarkable, but he is very uncomfortable and tachypneic.  Doubt ACS PE or dissection, will need CT abdomen pelvis to evaluate for procedural complications versus other acute issues.  Will check labs.  Bedside bladder scan shows 700 mL.  Will insert Foley catheter.   ----------------------------------------- 1:23 PM on 07/15/2024 ----------------------------------------- Pain improved after Foley.  2500 mL urine output so far.  Still has some abdominal tenderness.  Will need to proceed with CT given recent endoscopy procedures.  Case discussed with hospitalist for further management.      FINAL CLINICAL IMPRESSION(S) / ED DIAGNOSES   Final diagnoses:  Acute urinary retention  AKI (acute kidney injury) (HCC)     Rx / DC Orders   ED Discharge Orders     None        Note:  This document was prepared using Dragon voice recognition software and may include unintentional dictation errors.   Viviann Pastor, MD 07/15/24 1323

## 2024-07-16 ENCOUNTER — Ambulatory Visit

## 2024-07-16 ENCOUNTER — Ambulatory Visit: Payer: Self-pay | Admitting: Gastroenterology

## 2024-07-16 DIAGNOSIS — R338 Other retention of urine: Secondary | ICD-10-CM | POA: Diagnosis not present

## 2024-07-16 LAB — IRON AND TIBC
Iron: 24 ug/dL — ABNORMAL LOW (ref 45–182)
Saturation Ratios: 10 % — ABNORMAL LOW (ref 17.9–39.5)
TIBC: 251 ug/dL (ref 250–450)
UIBC: 227 ug/dL

## 2024-07-16 LAB — CBC
HCT: 23.4 % — ABNORMAL LOW (ref 39.0–52.0)
Hemoglobin: 7.8 g/dL — ABNORMAL LOW (ref 13.0–17.0)
MCH: 32.5 pg (ref 26.0–34.0)
MCHC: 33.3 g/dL (ref 30.0–36.0)
MCV: 97.5 fL (ref 80.0–100.0)
Platelets: 125 K/uL — ABNORMAL LOW (ref 150–400)
RBC: 2.4 MIL/uL — ABNORMAL LOW (ref 4.22–5.81)
RDW: 13.5 % (ref 11.5–15.5)
WBC: 4.5 K/uL (ref 4.0–10.5)
nRBC: 0 % (ref 0.0–0.2)

## 2024-07-16 LAB — ECHOCARDIOGRAM COMPLETE
Area-P 1/2: 2.64 cm2
Height: 72 in
S' Lateral: 3.8 cm
Weight: 2720 [oz_av]

## 2024-07-16 LAB — FOLATE: Folate: 29 ng/mL (ref >5.9)

## 2024-07-16 LAB — BASIC METABOLIC PANEL WITH GFR
Anion gap: 5 (ref 5–15)
BUN: 35 mg/dL — ABNORMAL HIGH (ref 8–23)
CO2: 22 mmol/L (ref 22–32)
Calcium: 8.1 mg/dL — ABNORMAL LOW (ref 8.9–10.3)
Chloride: 112 mmol/L — ABNORMAL HIGH (ref 98–111)
Creatinine, Ser: 1.97 mg/dL — ABNORMAL HIGH (ref 0.61–1.24)
GFR, Estimated: 33 mL/min — ABNORMAL LOW (ref >60)
Glucose, Bld: 103 mg/dL — ABNORMAL HIGH (ref 70–99)
Potassium: 3.7 mmol/L (ref 3.5–5.1)
Sodium: 139 mmol/L (ref 135–145)

## 2024-07-16 LAB — FERRITIN: Ferritin: 130 ng/mL (ref 24–336)

## 2024-07-16 LAB — VITAMIN B12: Vitamin B-12: 636 pg/mL (ref 180–914)

## 2024-07-16 MED ORDER — IRON SUCROSE 300 MG IVPB - SIMPLE MED
300.0000 mg | Freq: Once | Status: AC
  Administered 2024-07-16: 300 mg via INTRAVENOUS
  Filled 2024-07-16: qty 300

## 2024-07-16 NOTE — Progress Notes (Signed)
 Please inform patient that the pathology results from stomach biopsies did not reveal any infection.  He should continue taking Protonix  40 mg twice daily before meals as prescribed.  The polyps removed came back as benign.  I will see him for follow-up in clinic as scheduled in next 2 months  RV

## 2024-07-16 NOTE — Progress Notes (Signed)
 PROGRESS NOTE    Gary Mueller  FMW:[id number] DOB: [birth year] DOA: 07/15/2024 PCP: Gary Charlie LITTIE, MD    Brief Narrative:  83 y.o. male with PMH significant for HTN, HLD, CAD/stent 2023 on Plavix, A-fib not on anticoagulation, hypothyroidism, GERD, arthritis, anxiety/depression. Recently hospitalized 7/18-7/20/2025 for coffee-ground emesis, underwent EGD/colonoscopy.  Found to have nonbleeding gastric ulcer, erosive gastropathy, 3 to 5 mm polyp in the transverse colon in the ascending colon that was removed.  He was discharged home on Prilosec.  Plavix was kept on hold until seen by cardiologist as an outpatient. Patient presented to the ED this morning with complaint of lower abdominal pain, difficulty urinating.  He reports he has not been able to have a bowel movement since colonoscopy on Sunday.  He has previously had worsening lower abdominal pain.  He has been dribbling urine but not able to void completely.  He was seen at [facility] clinic yesterday.  Urinary frequency led to the suspicion of UTI and he was started on ciprofloxacin.  Symptoms continued at home and hence he presented to the ED today    In the ED, patient had temperature of 99, heart rate 60s, respiratory rate in 20s, blood pressure 133/61, breathing on room air. Patient was noted to have urinary retention.  Foley catheter inserted, more than 2.5 L drained per EDP. Labs with WBC count 5.1, hemoglobin 9.5, platelet 139, pCO2 19, BUN/creatinine 50/4.47 compared to baseline of 1.38 from 7/19, lactic acid 2.2>0.7 Urinalysis showed clear yellow urine with moderate hemoglobin, negative leukocytes, no bacteria. CT abdomen pelvis showed -Left inguinal hernia containing a portion of the urinary bladder with increasing stranding in the fat since prior exam. Recommend correlation for incarceration. Urology consultation is recommended. -Unchanged bilateral perinephric edema. -Colonic diverticulosis without  diverticulitis. -Enlarged prostate.   Hospitalist service was consulted for inpatient management   At the time of my evaluation, patient was lying on bed.  Not in distress. Alert, awake, oriented x 3.  Able to give details of his history. Accompanied by daughter at bedside. History reviewed and detailed as above. Also states he had a cardiac stent placed on October 2023 and was placed on aspirin  and Plavix.  Few months ago, aspirin  was stopped and only Plavix was continued.  Since last hospitalization, Plavix is on hold as well.  Assessment & Plan:   Principal Problem:   Acute urinary retention Active Problems:   Recurrent inguinal hernia of left side without obstruction or gangrene  Acute urinary retention Possible incarceration of urinary bladder on left inguinal hernia Presented with lower abdominal pain, urinary tension Foley catheter inserted, more than 2.5 L drained per EDP. Urinalysis not suggestive of acute infection.  Patient reports he was started on oral ciprofloxacin yesterday.  I do not see indication at this time May have underlying BPH CT abdomen and pelvis raise suspicion of possible incarceration of the urinary bladder on the left inguinal hernia.  Also showed enlarged prostate. Urology consulted Plan: Continue Foley, will continue on dc   AKI on CKD Baseline creatinine around 1.5.  Presented with creatinine elevated at 4.47.  Postobstructive uropathy suspected Plan: Continue IVF Continue Foley Hold Lasix  Lactic acidosis WBC count not elevated.  Urinalysis not suggestive of infection.  Lactic acid level was initially elevated over 2.2.  Likely related to postobstructive uropathy  Recent GI bleeding Iron  deficiency anemia History of B12 deficiency anemia Recently hospitalized 7/18-7/20/2025 for coffee-ground emesis, underwent EGD/colonoscopy.  Found to have nonbleeding gastric ulcer, erosive gastropathy,  3 to 5 mm polyp in the transverse colon in the  ascending colon that was removed.  He was discharged home on Prilosec.  Hemoglobin drifting downward.  No evidence of bleeding.  Suspect dilutional effect with IV fluids. Plan: Anemia labs Continue PPI Repeat CBC in a.m.  CAD/stent 2023 HLD After stent, he was was on aspirin  and Plavix.  Few months ago, aspirin  was stopped and only Plavix was continued.  Since last hospitalization, Plavix is on hold as well. Continue statin.  Outpatient cardiology follow-up   Systolic ejection murmur Echocardiogram reassuring   Hypertension  PTA meds- metoprolol , amlodipine , Lasix,  Continue metoprolol .  Hold Norvasc  and Lasix for now.  IV hydralazine  as needed.     Paroxysmal A-fib  Rate controlled on metoprolol   No anticoagulation   Hypothyroidism Continue Synthroid    Diverticulosis Bowel regimen  DVT prophylaxis: SQ heparin  Code Status: Full Family Communication: Daughter at bedside 7/23 Disposition Plan: Status is: Inpatient Remains inpatient appropriate because: Severe AKI.  Postobstructive uropathy.  Anticipate discharge 7/24.   Level of care: Telemetry Medical  Consultants:  Urology Surgery  Procedures:  Foley  Antimicrobials: None   Subjective: Seen and examined.  Resting comfortably in bed.  No visible distress.  No complaints of pain.  Feels well overall.  Objective: Vitals:   07/15/24 1456 07/15/24 1519 07/15/24 2032 07/16/24 0340  BP: 130/65 (!) 130/40 (!) 115/50 (!) 118/51  Pulse:  62 (!) 57 (!) 58  Resp:  18 18 20   Temp:  97.8 F (36.6 C) 98 F (36.7 C) 98.3 F (36.8 C)  TempSrc:  Oral Oral Oral  SpO2:  97% 96% 94%  Weight:      Height:        Intake/Output Summary (Last 24 hours) at 07/16/2024 1241 Last data filed at 07/16/2024 0340 Gross per 24 hour  Intake 896.31 ml  Output 1250 ml  Net -353.69 ml   Filed Weights   07/15/24 1009  Weight: 77.1 kg    Examination:  General exam: Appears calm and comfortable  Respiratory system: Clear to  auscultation. Respiratory effort normal. Cardiovascular system: S1-S2, RRR, 2/6 systolic murmur, no pedal edema Gastrointestinal system: Soft, NT/ND, normal bowel sounds GU: Foley Central nervous system: Alert and oriented. No focal neurological deficits. Extremities: Symmetric 5 x 5 power. Skin: No rashes, lesions or ulcers Psychiatry: Judgement and insight appear normal. Mood & affect appropriate.     Data Reviewed: I have personally reviewed following labs and imaging studies  CBC: Recent Labs  Lab 07/12/24 0346 07/12/24 1627 07/12/24 2352 07/15/24 1013 07/16/24 0213  WBC 6.9 6.6 8.6 5.1 4.5  HGB 9.5* 9.8* 10.0* 9.5* 7.8*  HCT 28.9* 28.8* 30.1* 28.5* 23.4*  MCV 96.7 96.0 96.2 96.3 97.5  PLT 156 144* 160 139* 125*   Basic Metabolic Panel: Recent Labs  Lab 07/11/24 1245 07/12/24 0346 07/15/24 1013 07/16/24 0213  NA 140 142 138 139  K 5.0 4.7 4.0 3.7  CL 111 111 105 112*  CO2 20* 24 19* 22  GLUCOSE 123* 97 140* 103*  BUN 47* 58* 50* 35*  CREATININE 1.55* 1.38* 4.47* 1.97*  CALCIUM  9.4 9.1 9.4 8.1*   GFR: Estimated Creatinine Clearance: 30.4 mL/min (A) (by C-G formula based on SCr of 1.97 mg/dL (H)). Liver Function Tests: Recent Labs  Lab 07/11/24 1245 07/15/24 1013  AST 20 22  ALT 17 16  ALKPHOS 48 47  BILITOT 0.8 0.9  PROT 6.2* 5.9*  ALBUMIN 3.6 3.7   Recent  Labs  Lab 07/11/24 1245 07/15/24 1013  LIPASE 39 34   No results for input(s): AMMONIA in the last 168 hours. Coagulation Profile: Recent Labs  Lab 07/11/24 1238 07/12/24 0346  INR 1.2 1.1   Cardiac Enzymes: No results for input(s): CKTOTAL, CKMB, CKMBINDEX, TROPONINI in the last 168 hours. BNP (last 3 results) No results for input(s): PROBNP in the last 8760 hours. HbA1C: No results for input(s): HGBA1C in the last 72 hours. CBG: No results for input(s): GLUCAP in the last 168 hours. Lipid Profile: No results for input(s): CHOL, HDL, LDLCALC, TRIG,  CHOLHDL, LDLDIRECT in the last 72 hours. Thyroid  Function Tests: No results for input(s): TSH, T4TOTAL, FREET4, T3FREE, THYROIDAB in the last 72 hours. Anemia Panel: Recent Labs    07/16/24 0213  FOLATE 29.0  FERRITIN 130  TIBC 251  IRON  24*   Sepsis Labs: Recent Labs  Lab 07/15/24 1054 07/15/24 1252  LATICACIDVEN 2.2* 0.7    No results found for this or any previous visit (from the past 240 hours).       Radiology Studies: ECHOCARDIOGRAM COMPLETE Result Date: 07/16/2024    ECHOCARDIOGRAM REPORT   Patient Name:   Gary Mueller Date of Exam: 07/15/2024 Medical Rec #:  [id number]      Height:       72.0 in Accession #:    [id number]     Weight:       170.0 lb Date of Birth:  Nov 12, 1941     BSA:          1.988 m Patient Age:    82 years       BP:           116/58 mmHg Patient Gender: M              HR:           58 bpm. Exam Location:  [facility] Procedure: 2D Echo, Cardiac Doppler and Color Doppler (Both Spectral and Color            Flow Doppler were utilized during procedure). Indications:     R94.31 Abnormal EKG  History:         Patient has no prior history of Echocardiogram examinations.                  Risk Factors:Hypertension, Dyslipidemia and Former Smoker.  Sonographer:     Carl Coma RDCS Referring Phys:  [id number] [facility] DAHAL Diagnosing Phys: Marsa Dooms MD IMPRESSIONS  1. Left ventricular ejection fraction, by estimation, is 55 to 60%. The left ventricle has normal function. The left ventricle has no regional wall motion abnormalities. Left ventricular diastolic parameters are consistent with Grade I diastolic dysfunction (impaired relaxation).  2. Right ventricular systolic function is normal. The right ventricular size is normal.  3. The mitral valve is normal in structure. Mild mitral valve regurgitation. No evidence of mitral stenosis.  4. The aortic valve is normal in structure. Aortic valve regurgitation is not visualized. No aortic stenosis is  present.  5. The inferior vena cava is normal in size with greater than 50% respiratory variability, suggesting right atrial pressure of 3 mmHg. FINDINGS  Left Ventricle: Left ventricular ejection fraction, by estimation, is 55 to 60%. The left ventricle has normal function. The left ventricle has no regional wall motion abnormalities. Strain was performed and the global longitudinal strain is indeterminate. The left ventricular internal cavity size was normal in size. There is no left ventricular hypertrophy. Left ventricular  diastolic parameters are consistent with Grade I diastolic dysfunction (impaired relaxation). Right Ventricle: The right ventricular size is normal. No increase in right ventricular wall thickness. Right ventricular systolic function is normal. Left Atrium: Left atrial size was normal in size. Right Atrium: Right atrial size was normal in size. Pericardium: There is no evidence of pericardial effusion. Mitral Valve: The mitral valve is normal in structure. Mild mitral valve regurgitation. No evidence of mitral valve stenosis. Tricuspid Valve: The tricuspid valve is normal in structure. Tricuspid valve regurgitation is mild . No evidence of tricuspid stenosis. Aortic Valve: The aortic valve is normal in structure. Aortic valve regurgitation is not visualized. No aortic stenosis is present. Pulmonic Valve: The pulmonic valve was normal in structure. Pulmonic valve regurgitation is not visualized. No evidence of pulmonic stenosis. Aorta: The aortic root is normal in size and structure. Venous: The inferior vena cava is normal in size with greater than 50% respiratory variability, suggesting right atrial pressure of 3 mmHg. IAS/Shunts: No atrial level shunt detected by color flow Doppler. Additional Comments: 3D was performed not requiring image post processing on an independent workstation and was indeterminate.  LEFT VENTRICLE PLAX 2D LVIDd:         5.40 cm   Diastology LVIDs:         3.80 cm    LV e' medial:    7.94 cm/s LV PW:         1.10 cm   LV E/e' medial:  9.5 LV IVS:        1.10 cm   LV e' lateral:   12.80 cm/s LVOT diam:     2.20 cm   LV E/e' lateral: 5.9 LV SV:         86 LV SV Index:   43 LVOT Area:     3.80 cm  RIGHT VENTRICLE             IVC RV Basal diam:  4.10 cm     IVC diam: 1.80 cm RV S prime:     12.45 cm/s TAPSE (M-mode): 2.8 cm LEFT ATRIUM           Index        RIGHT ATRIUM          Index LA diam:      4.40 cm 2.21 cm/m   RA Area:     9.84 cm LA Vol (A2C): 67.4 ml 33.90 ml/m  RA Volume:   18.30 ml 9.20 ml/m LA Vol (A4C): 44.6 ml 22.43 ml/m  AORTIC VALVE LVOT Vmax:   95.60 cm/s LVOT Vmean:  64.150 cm/s LVOT VTI:    0.225 m  AORTA Ao Root diam: 3.80 cm MITRAL VALVE MV Area (PHT): 2.64 cm    SHUNTS MV Decel Time: 287 msec    Systemic VTI:  0.22 m MV E velocity: 75.70 cm/s  Systemic Diam: 2.20 cm MV A velocity: 84.65 cm/s MV E/A ratio:  0.89 Marsa Dooms MD Electronically signed by Marsa Dooms MD Signature Date/Time: 07/16/2024/8:14:05 AM    Final    CT ABDOMEN PELVIS WO CONTRAST Result Date: 07/15/2024 CLINICAL DATA:  Acute abdominal pain. Patient reports difficulty urinating. Swelling and groin. Recent hospital discharge. EXAM: CT ABDOMEN AND PELVIS WITHOUT CONTRAST TECHNIQUE: Multidetector CT imaging of the abdomen and pelvis was performed following the standard protocol without IV contrast. RADIATION DOSE REDUCTION: This exam was performed according to the departmental dose-optimization program which includes automated exposure control, adjustment of the mA and/or kV according  to patient size and/or use of iterative reconstruction technique. COMPARISON:  GI bleeding study 07/12/2024 FINDINGS: Lower chest: Subpleural reticulation in the lung bases, right greater than left, unchanged. No confluent consolidation or pleural effusion. Hepatobiliary: Scattered punctate hepatic granuloma. No evidence of focal liver abnormality on this unenhanced exam. Cholecystectomy  without biliary dilatation. Pancreas: Unremarkable. No pancreatic ductal dilatation or surrounding inflammatory changes. Spleen: Normal in size without focal abnormality. Adrenals/Urinary Tract: No adrenal nodule. Unchanged bilateral perinephric edema. Unchanged bilateral low-density renal lesions typical of cysts. No further follow-up imaging is recommended. The urinary bladder is decompressed by Foley catheter. Left aspect of the bladder dome versus diverticulum again extends into a left inguinal hernia. There is increasing stranding adjacent to the herniated bladder in the inguinal canal. Stomach/Bowel: Decompressed urinary bladder. No bowel obstruction or inflammation. Colonic diverticulosis without diverticulitis. Small to moderate colonic stool burden. Normal appendix visualized. Vascular/Lymphatic: Extensive aortic and branch atherosclerosis. The penetrating ulcer within the infrarenal aorta on contrast-enhanced exam is not well demonstrated in the absence of IV contrast. There is no periaortic stranding to suggest rupture. No abdominopelvic adenopathy. Reproductive: Enlarged prostate spans 5.2 cm. Other: Left inguinal hernia containing portion of the urinary bladder with increased stranding in the fat since prior exam. No ascites or free air. Postsurgical change in the right upper anterior abdominal wall. Musculoskeletal: No acute osseous finding. 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 Degenerative change in the spine. IMPRESSION: 1. Left inguinal hernia containing a portion of the urinary bladder with increasing stranding in the fat since prior exam. Recommend correlation for incarceration. Urology consultation is recommended. 2. Unchanged bilateral perinephric edema. 3. Colonic diverticulosis without diverticulitis. 4. Enlarged prostate. Aortic Atherosclerosis (ICD10-I70.0). Electronically Signed   By: Andrea Gasman M.D.   On: 07/15/2024 14:34        Scheduled Meds:   cyanocobalamin   1,000 mcg Oral Daily   heparin   5,000 Units Subcutaneous Q8H   levothyroxine   75 mcg Oral QAC breakfast   metoprolol  succinate  12.5 mg Oral QPM   pantoprazole   40 mg Oral BID AC   senna-docusate  1 tablet Oral QHS   tamsulosin   0.4 mg Oral Daily   Continuous Infusions:  sodium chloride  100 mL/hr at 07/16/24 0201     LOS: 1 day     Calvin KATHEE Robson, MD [facility]   If 7PM-7AM, please contact night-coverage  07/16/2024, 12:41 PM

## 2024-07-16 NOTE — Progress Notes (Signed)
 Brief Urology Progress Note  After conferring with Dr. Jordis and the patient's daughter, [name], I will adjust his upcoming appointment with me to a 2-part voiding trial on Flomax .  Edrian Melucci, PA-C 07/16/24  4:31 PM

## 2024-07-16 NOTE — TOC CM/SW Note (Signed)
 Transition of Care [facility]) - Inpatient Brief Assessment   Patient Details  Name: Gary Mueller MRN: [id number] Date of Birth: [birth year]  Transition of Care [facility]) CM/SW Contact:    Asberry CHRISTELLA Jaksch, RN Phone Number: 07/16/2024, 9:56 AM   Clinical Narrative:  Transition of Care [facility]) Screening Note   Patient Details  Name: Gary Mueller Date of Birth: 05-03-1941   Transition of Care [facility]) CM/SW Contact:    Asberry CHRISTELLA Jaksch, RN Phone Number: 07/16/2024, 9:56 AM    Transition of Care Department [facility]) has reviewed patient and no TOC needs have been identified at this time. If new patient transition needs arise, please place a TOC consult.     Transition of Care Asessment: Insurance and Status: Insurance coverage has been reviewed Patient has primary care physician: Yes     Prior/Current Home Services: No current home services Social Drivers of Health Review: SDOH reviewed no interventions necessary Readmission risk has been reviewed: Yes Transition of care needs: no transition of care needs at this time

## 2024-07-16 NOTE — Care Management Important Message (Signed)
 Important Message  Patient Details  Name: Gary Mueller MRN: [id number] Date of Birth: [birth year]   Important Message Given:  Yes - Medicare IM     Gary Mueller 07/16/2024, 12:28 PM

## 2024-07-17 DIAGNOSIS — R338 Other retention of urine: Secondary | ICD-10-CM | POA: Diagnosis not present

## 2024-07-17 LAB — CBC WITH DIFFERENTIAL/PLATELET
Abs Immature Granulocytes: 0.03 K/uL (ref 0.00–0.07)
Basophils Absolute: 0 K/uL (ref 0.0–0.1)
Basophils Relative: 1 %
Eosinophils Absolute: 0.1 K/uL (ref 0.0–0.5)
Eosinophils Relative: 3 %
HCT: 26.3 % — ABNORMAL LOW (ref 39.0–52.0)
Hemoglobin: 8.6 g/dL — ABNORMAL LOW (ref 13.0–17.0)
Immature Granulocytes: 1 %
Lymphocytes Relative: 28 %
Lymphs Abs: 1.2 K/uL (ref 0.7–4.0)
MCH: 31.7 pg (ref 26.0–34.0)
MCHC: 32.7 g/dL (ref 30.0–36.0)
MCV: 97 fL (ref 80.0–100.0)
Monocytes Absolute: 0.5 K/uL (ref 0.1–1.0)
Monocytes Relative: 11 %
Neutro Abs: 2.4 K/uL (ref 1.7–7.7)
Neutrophils Relative %: 56 %
Platelets: 119 K/uL — ABNORMAL LOW (ref 150–400)
RBC: 2.71 MIL/uL — ABNORMAL LOW (ref 4.22–5.81)
RDW: 13.9 % (ref 11.5–15.5)
WBC: 4.2 K/uL (ref 4.0–10.5)
nRBC: 0 % (ref 0.0–0.2)

## 2024-07-17 LAB — BASIC METABOLIC PANEL WITH GFR
Anion gap: 5 (ref 5–15)
BUN: 19 mg/dL (ref 8–23)
CO2: 25 mmol/L (ref 22–32)
Calcium: 8.4 mg/dL — ABNORMAL LOW (ref 8.9–10.3)
Chloride: 112 mmol/L — ABNORMAL HIGH (ref 98–111)
Creatinine, Ser: 1.06 mg/dL (ref 0.61–1.24)
GFR, Estimated: >60 mL/min (ref >60)
Glucose, Bld: 91 mg/dL (ref 70–99)
Potassium: 4 mmol/L (ref 3.5–5.1)
Sodium: 142 mmol/L (ref 135–145)

## 2024-07-17 LAB — MAGNESIUM: Magnesium: 1.9 mg/dL (ref 1.7–2.4)

## 2024-07-17 MED ORDER — TAMSULOSIN HCL 0.4 MG PO CAPS: 0.4000 mg | ORAL_CAPSULE | Freq: Every day | ORAL | 0 refills | Status: DC

## 2024-07-17 MED ORDER — POLYVINYL ALCOHOL 1.4 % OP SOLN
2.0000 [drp] | OPHTHALMIC | Status: DC | PRN
  Administered 2024-07-17: 2 [drp] via OPHTHALMIC
  Filled 2024-07-17: qty 15

## 2024-07-17 NOTE — Plan of Care (Signed)
   Problem: Education: Goal: Knowledge of General Education information will improve Description Including pain rating scale, medication(s)/side effects and non-pharmacologic comfort measures Outcome: Progressing

## 2024-07-17 NOTE — Discharge Summary (Signed)
 Physician Discharge Summary  Gary Mueller FMW:[id number] DOB: 1940-12-31 DOA: 07/15/2024  PCP: Bertrum Charlie LITTIE, MD  Admit date: 07/15/2024 Discharge date: 07/17/2024  Admitted From: Home Disposition:  Home  Recommendations for Outpatient Follow-up:  Follow up with PCP in 1-2 weeks Follow up urology 2 weeks  Home Health:No  Equipment/Devices:Foley catheter   Discharge Condition:Stable  CODE STATUS:FULL  Diet recommendation: Reg  Brief/Interim Summary:   83 y.o. male with PMH significant for HTN, HLD, CAD/stent 2023 on Plavix, A-fib not on anticoagulation, hypothyroidism, GERD, arthritis, anxiety/depression. Recently hospitalized 7/18-7/20/2025 for coffee-ground emesis, underwent EGD/colonoscopy.  Found to have nonbleeding gastric ulcer, erosive gastropathy, 3 to 5 mm polyp in the transverse colon in the ascending colon that was removed.  He was discharged home on Prilosec.  Plavix was kept on hold until seen by cardiologist as an outpatient. Patient presented to the ED this morning with complaint of lower abdominal pain, difficulty urinating.  He reports he has not been able to have a bowel movement since colonoscopy on Sunday.  He has previously had worsening lower abdominal pain.  He has been dribbling urine but not able to void completely.  He was seen at [facility] clinic yesterday.  Urinary frequency led to the suspicion of UTI and he was started on ciprofloxacin.  Symptoms continued at home and hence he presented to the ED today    In the ED, patient had temperature of 99, heart rate 60s, respiratory rate in 20s, blood pressure 133/61, breathing on room air. Patient was noted to have urinary retention.  Foley catheter inserted, more than 2.5 L drained per EDP. Labs with WBC count 5.1, hemoglobin 9.5, platelet 139, pCO2 19, BUN/creatinine 50/4.47 compared to baseline of 1.38 from 7/19, lactic acid 2.2>0.7 Urinalysis showed clear yellow urine with moderate hemoglobin, negative  leukocytes, no bacteria. CT abdomen pelvis showed -Left inguinal hernia containing a portion of the urinary bladder with increasing stranding in the fat since prior exam. Recommend correlation for incarceration. Urology consultation is recommended. -Unchanged bilateral perinephric edema. -Colonic diverticulosis without diverticulitis. -Enlarged prostate.   Hospitalist service was consulted for inpatient management   At the time of my evaluation, patient was lying on bed.  Not in distress. Alert, awake, oriented x 3.  Able to give details of his history. Accompanied by daughter at bedside. History reviewed and detailed as above. Also states he had a cardiac stent placed on October 2023 and was placed on aspirin  and Plavix.  Few months ago, aspirin  was stopped and only Plavix was continued.  Since last hospitalization, Plavix is on hold as well.   Discharge Diagnoses:  Principal Problem:   Acute urinary retention Active Problems:   Recurrent inguinal hernia of left side without obstruction or gangrene  Acute urinary retention Possible incarceration of urinary bladder on left inguinal hernia Presented with lower abdominal pain, urinary tension Foley catheter inserted, more than 2.5 L drained per EDP. Urinalysis not suggestive of acute infection.  Patient reports he was started on oral ciprofloxacin yesterday.  I do not see indication at this time May have underlying BPH CT abdomen and pelvis raise suspicion of possible incarceration of the urinary bladder on the left inguinal hernia.  Also showed enlarged prostate. Urology consulted Plan: Continue Foley, will continue on dc  Flomax  Outpatient voiding trial   AKI on CKD Baseline creatinine around 1.5.  Presented with creatinine elevated at 4.47.  Postobstructive uropathy suspected Creatinine normalized on discharge Plan: Continue Foley Outpatient PCP follow-up   Lactic  acidosis WBC count not elevated.  Urinalysis not  suggestive of infection.  Lactic acid level was initially elevated over 2.2.  Likely related to postobstructive uropathy   Recent GI bleeding Iron  deficiency anemia History of B12 deficiency anemia Recently hospitalized 7/18-7/20/2025 for coffee-ground emesis, underwent EGD/colonoscopy.  Found to have nonbleeding gastric ulcer, erosive gastropathy, 3 to 5 mm polyp in the transverse colon in the ascending colon that was removed.  He was discharged home on Prilosec.  Hemoglobin drifting downward.  No evidence of bleeding.  Suspect dilutional effect with IV fluids. Plan: Iron  in house.  Follow-up outpatient PCP to determine need for oral iron  replacement   CAD/stent 2023 HLD After stent, he was was on aspirin  and Plavix.  Few months ago, aspirin  was stopped and only Plavix was continued.  Since last hospitalization, Plavix is on hold as well. Continue statin.  Outpatient cardiology follow-up   Systolic ejection murmur Echocardiogram reassuring   Hypertension  PTA meds- metoprolol , amlodipine , Lasix,  Resume on discharge   Paroxysmal A-fib  Rate controlled on metoprolol   No anticoagulation   Hypothyroidism Continue Synthroid    Diverticulosis Bowel regimen   Discharge Instructions  Discharge Instructions     Diet - low sodium heart healthy   Complete by: As directed    Increase activity slowly   Complete by: As directed       Allergies as of 07/17/2024       Reactions   Codeine    GI intolerance; hallucinations    Statins    Muscle pain   Tamsulosin  Cough        Medication List     PAUSE taking these medications    clopidogrel 75 MG tablet Wait to take this until your doctor or other care provider tells you to start again. Commonly known as: PLAVIX Take 75 mg by mouth daily.       TAKE these medications    acetaminophen  500 MG tablet Commonly known as: TYLENOL  Take 1,000 mg by mouth daily as needed for moderate pain.   amLODipine  5 MG  tablet Commonly known as: NORVASC  Take 1 tablet (5 mg total) by mouth daily. Please schedule office visit before any future refill.   ascorbic acid  1000 MG tablet Commonly known as: VITAMIN C Take 1 tablet by mouth daily.   cyanocobalamin  1000 MCG tablet Take 1 tablet (1,000 mcg total) by mouth daily.   furosemide 20 MG tablet Commonly known as: LASIX Take 20 mg by mouth daily.   levothyroxine  75 MCG tablet Commonly known as: SYNTHROID  Take 75 mcg by mouth daily before breakfast. What changed: Another medication with the same name was removed. Continue taking this medication, and follow the directions you see here.   losartan  50 MG tablet Commonly known as: COZAAR  TAKE ONE TABLET BY MOUTH EVERY DAY   magnesium  oxide 400 MG tablet Commonly known as: MAG-OX Take 400 mg by mouth. Take 1 tablet (400 mg total) by mouth.   metoprolol  succinate 25 MG 24 hr tablet Commonly known as: TOPROL -XL TAKE 1/2 TABLET BY MOUTH EVERY EVENING   multivitamin with minerals Tabs tablet Take 1 tablet by mouth every evening.   pantoprazole  40 MG tablet Commonly known as: PROTONIX  Take 1 tablet (40 mg total) by mouth 2 (two) times daily before a meal.   pimecrolimus 1 % cream Commonly known as: ELIDEL Apply topically. Apply topically two (2) times a day.   potassium chloride 10 MEQ tablet Commonly known as: KLOR-CON Take 10 mEq by  mouth daily.   rosuvastatin  40 MG tablet Commonly known as: CRESTOR  TAKE ONE TABLET BY MOUTH EVERY DAY   tamsulosin  0.4 MG Caps capsule Commonly known as: FLOMAX  Take 1 capsule (0.4 mg total) by mouth daily. Start taking on: July 18, 2024   vitamin E  180 MG (400 UNITS) capsule Take 1 capsule by mouth daily.        Follow-up Information     Dudley, [place], MD Follow up on 07/21/2024.   Specialty: General Surgery Contact information: [address] [place] [address] [phone]                Allergies  Allergen  Reactions   Codeine     GI intolerance; hallucinations    Statins     Muscle pain   Tamsulosin  Cough    Consultations: Urology   Procedures/Studies: ECHOCARDIOGRAM COMPLETE Result Date: 07/16/2024    ECHOCARDIOGRAM REPORT   Patient Name:   LEONARD FEIGEL Date of Exam: 07/15/2024 Medical Rec #:  [id number]      Height:       72.0 in Accession #:    [id number]     Weight:       170.0 lb Date of Birth:  [birth year]     BSA:          1.988 m Patient Age:    82 years       BP:           116/58 mmHg Patient Gender: M              HR:           58 bpm. Exam Location:  [facility] Procedure: 2D Echo, Cardiac Doppler and Color Doppler (Both Spectral and Color            Flow Doppler were utilized during procedure). Indications:     R94.31 Abnormal EKG  History:         Patient has no prior history of Echocardiogram examinations.                  Risk Factors:Hypertension, Dyslipidemia and Former Smoker.  Sonographer:     Carl Coma RDCS Referring Phys:  [id number] [facility] DAHAL Diagnosing Phys: Marsa Dooms MD IMPRESSIONS  1. Left ventricular ejection fraction, by estimation, is 55 to 60%. The left ventricle has normal function. The left ventricle has no regional wall motion abnormalities. Left ventricular diastolic parameters are consistent with Grade I diastolic dysfunction (impaired relaxation).  2. Right ventricular systolic function is normal. The right ventricular size is normal.  3. The mitral valve is normal in structure. Mild mitral valve regurgitation. No evidence of mitral stenosis.  4. The aortic valve is normal in structure. Aortic valve regurgitation is not visualized. No aortic stenosis is present.  5. The inferior vena cava is normal in size with greater than 50% respiratory variability, suggesting right atrial pressure of 3 mmHg. FINDINGS  Left Ventricle: Left ventricular ejection fraction, by estimation, is 55 to 60%. The left ventricle has normal function. The left ventricle has no  regional wall motion abnormalities. Strain was performed and the global longitudinal strain is indeterminate. The left ventricular internal cavity size was normal in size. There is no left ventricular hypertrophy. Left ventricular diastolic parameters are consistent with Grade I diastolic dysfunction (impaired relaxation). Right Ventricle: The right ventricular size is normal. No increase in right ventricular wall thickness. Right ventricular systolic function is normal. Left Atrium: Left atrial size was  normal in size. Right Atrium: Right atrial size was normal in size. Pericardium: There is no evidence of pericardial effusion. Mitral Valve: The mitral valve is normal in structure. Mild mitral valve regurgitation. No evidence of mitral valve stenosis. Tricuspid Valve: The tricuspid valve is normal in structure. Tricuspid valve regurgitation is mild . No evidence of tricuspid stenosis. Aortic Valve: The aortic valve is normal in structure. Aortic valve regurgitation is not visualized. No aortic stenosis is present. Pulmonic Valve: The pulmonic valve was normal in structure. Pulmonic valve regurgitation is not visualized. No evidence of pulmonic stenosis. Aorta: The aortic root is normal in size and structure. Venous: The inferior vena cava is normal in size with greater than 50% respiratory variability, suggesting right atrial pressure of 3 mmHg. IAS/Shunts: No atrial level shunt detected by color flow Doppler. Additional Comments: 3D was performed not requiring image post processing on an independent workstation and was indeterminate.  LEFT VENTRICLE PLAX 2D LVIDd:         5.40 cm   Diastology LVIDs:         3.80 cm   LV e' medial:    7.94 cm/s LV PW:         1.10 cm   LV E/e' medial:  9.5 LV IVS:        1.10 cm   LV e' lateral:   12.80 cm/s LVOT diam:     2.20 cm   LV E/e' lateral: 5.9 LV SV:         86 LV SV Index:   43 LVOT Area:     3.80 cm  RIGHT VENTRICLE             IVC RV Basal diam:  4.10 cm     IVC diam:  1.80 cm RV S prime:     12.45 cm/s TAPSE (M-mode): 2.8 cm LEFT ATRIUM           Index        RIGHT ATRIUM          Index LA diam:      4.40 cm 2.21 cm/m   RA Area:     9.84 cm LA Vol (A2C): 67.4 ml 33.90 ml/m  RA Volume:   18.30 ml 9.20 ml/m LA Vol (A4C): 44.6 ml 22.43 ml/m  AORTIC VALVE LVOT Vmax:   95.60 cm/s LVOT Vmean:  64.150 cm/s LVOT VTI:    0.225 m  AORTA Ao Root diam: 3.80 cm MITRAL VALVE MV Area (PHT): 2.64 cm    SHUNTS MV Decel Time: 287 msec    Systemic VTI:  0.22 m MV E velocity: 75.70 cm/s  Systemic Diam: 2.20 cm MV A velocity: 84.65 cm/s MV E/A ratio:  0.89 Marsa Dooms MD Electronically signed by Marsa Dooms MD Signature Date/Time: 07/16/2024/8:14:05 AM    Final    CT ABDOMEN PELVIS WO CONTRAST Result Date: 07/15/2024 CLINICAL DATA:  Acute abdominal pain. Patient reports difficulty urinating. Swelling and groin. Recent hospital discharge. EXAM: CT ABDOMEN AND PELVIS WITHOUT CONTRAST TECHNIQUE: Multidetector CT imaging of the abdomen and pelvis was performed following the standard protocol without IV contrast. RADIATION DOSE REDUCTION: This exam was performed according to the departmental dose-optimization program which includes automated exposure control, adjustment of the mA and/or kV according to patient size and/or use of iterative reconstruction technique. COMPARISON:  GI bleeding study 07/12/2024 FINDINGS: Lower chest: Subpleural reticulation in the lung bases, right greater than left, unchanged. No confluent consolidation or pleural effusion. Hepatobiliary: Scattered punctate  hepatic granuloma. No evidence of focal liver abnormality on this unenhanced exam. Cholecystectomy without biliary dilatation. Pancreas: Unremarkable. No pancreatic ductal dilatation or surrounding inflammatory changes. Spleen: Normal in size without focal abnormality. Adrenals/Urinary Tract: No adrenal nodule. Unchanged bilateral perinephric edema. Unchanged bilateral low-density renal lesions  typical of cysts. No further follow-up imaging is recommended. The urinary bladder is decompressed by Foley catheter. Left aspect of the bladder dome versus diverticulum again extends into a left inguinal hernia. There is increasing stranding adjacent to the herniated bladder in the inguinal canal. Stomach/Bowel: Decompressed urinary bladder. No bowel obstruction or inflammation. Colonic diverticulosis without diverticulitis. Small to moderate colonic stool burden. Normal appendix visualized. Vascular/Lymphatic: Extensive aortic and branch atherosclerosis. The penetrating ulcer within the infrarenal aorta on contrast-enhanced exam is not well demonstrated in the absence of IV contrast. There is no periaortic stranding to suggest rupture. No abdominopelvic adenopathy. Reproductive: Enlarged prostate spans 5.2 cm. Other: Left inguinal hernia containing portion of the urinary bladder with increased stranding in the fat since prior exam. No ascites or free air. Postsurgical change in the right upper anterior abdominal wall. Musculoskeletal: No acute osseous finding. 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 Degenerative change in the spine. IMPRESSION: 1. Left inguinal hernia containing a portion of the urinary bladder with increasing stranding in the fat since prior exam. Recommend correlation for incarceration. Urology consultation is recommended. 2. Unchanged bilateral perinephric edema. 3. Colonic diverticulosis without diverticulitis. 4. Enlarged prostate. Aortic Atherosclerosis (ICD10-I70.0). Electronically Signed   By: Andrea Gasman M.D.   On: 07/15/2024 14:34   CT ANGIO GI BLEED Result Date: 07/12/2024 CLINICAL DATA:  Lower GI bleed EXAM: CTA ABDOMEN AND PELVIS WITHOUT AND WITH CONTRAST TECHNIQUE: Multidetector CT imaging of the abdomen and pelvis was performed using the standard protocol during bolus administration of intravenous contrast. Multiplanar reconstructed images and MIPs  were obtained and reviewed to evaluate the vascular anatomy. RADIATION DOSE REDUCTION: This exam was performed according to the departmental dose-optimization program which includes automated exposure control, adjustment of the mA and/or kV according to patient size and/or use of iterative reconstruction technique. CONTRAST:  OMNIPAQUE  IOHEXOL  350 MG/ML SOLN COMPARISON:  CTA chest 03/20/2024 FINDINGS: VASCULAR Aorta: Moderate calcified atheromatous plaque throughout, with a 1.5 cm penetrating atheromatous ulcer along the left posterolateral wall in the infrarenal segment. No aneurysm or stenosis. Celiac: Partially calcified ostial plaque resulting in mild short-segment stenosis, patent distally with classic trifurcation anatomy. SMA: Calcified ostial plaque without significant stenosis, mildly atheromatous but patent distally with classic branch anatomy. Renals: Single bilaterally, both with calcified ostial plaque resulting in at least mild short-segment stenosis, patent distally. IMA: Patent without evidence of aneurysm, dissection, vasculitis or significant stenosis. Inflow: Moderate scattered atheromatous plaque without dissection or stenosis. Fusiform 2.1 cm dilatation of the right common iliac artery, left 1.5 cm. Proximal Outflow: Mild plaque, no aneurysm, dissection, or stenosis. Veins: Patent hepatic veins, portal vein, splenic vein, SMV. Incomplete opacification of iliac venous system and IVC which appear grossly unremarkable. Review of the MIP images confirms the above findings. NON-VASCULAR Lower chest: No pleural or pericardial effusion. Coarse peripheral interstitial opacities in both lung bases right greater than left. Hepatobiliary: No focal liver abnormality is seen. Status post cholecystectomy. No biliary dilatation. Pancreas: Unremarkable. No pancreatic ductal dilatation or surrounding inflammatory changes. Spleen: Normal in size without focal abnormality. Adrenals/Urinary Tract: No adrenal  mass. Symmetric renal parenchymal enhancement without hydronephrosis. Multiple cortical lesions in both kidneys, some of which can be characterized as  cysts, largest 2.2 cm 15 HU right upper pole; no followup recommended. Urinary bladder is distended, with left anterolateral mass or diverticulum extending into the left inguinal hernia, containing a small punctate calcification. Stomach/Bowel: Stomach is partially distended, that acute finding. Small bowel decompressed. Normal appendix. Colon is incompletely distended, with scattered sigmoid diverticula; no adjacent inflammatory change. No active extravasation into the lumen. Lymphatic: No abdominal or pelvic adenopathy. Reproductive: Moderate prostate enlargement. Other: No ascites.  No free air. Musculoskeletal: Mild lumbar levoscoliosis apex L2 with multilevel spondylitic changes in the lower thoracic and lumbar spine. Probable AVN in the right femoral head without subchondral collapse. IMPRESSION: 1. No evidence of active GI bleed. 2. Sigmoid diverticulosis. 3. Left anterolateral urinary bladder mass or diverticulum extending into the left inguinal hernia. Consider urologic consultation. 4. Probable AVN in the right femoral head without subchondral collapse. 5.  Aortic Atherosclerosis (ICD10-I70.0). Electronically Signed   By: JONETTA Faes M.D.   On: 07/12/2024 13:59   DG Chest Portable 1 View Result Date: 07/11/2024 CLINICAL DATA:  Shortness of breath, weakness EXAM: PORTABLE CHEST 1 VIEW COMPARISON:  March 20, 2024 and prior studies FINDINGS: Unchanged elevation of the right hemidiaphragm with right lung volume loss. Likely bibasilar scarring/atelectasis no new consolidation. Unchanged cardiomediastinal silhouette. No acute osseous findings. Radiodensity projecting over the right lung apex and possibly external to the patient. IMPRESSION: Unchanged elevation of the right hemidiaphragm. Electronically Signed   By: Michaeline Blanch M.D.   On: 07/11/2024 14:04       Subjective: Seen and examined on the day of discharge.  Stable no distress.  Appropriate for discharge home.  Discharge Exam: Vitals:   07/17/24 0341 07/17/24 0853  BP: (!) 131/59 (!) 132/55  Pulse: (!) 56 (!) 56  Resp: 16 16  Temp: 98.2 F (36.8 C) (!) 97.4 F (36.3 C)  SpO2: 96% 96%   Vitals:   07/16/24 1446 07/16/24 2020 07/17/24 0341 07/17/24 0853  BP: (!) 131/55 (!) 127/48 (!) 131/59 (!) 132/55  Pulse: (!) 58 (!) 58 (!) 56 (!) 56  Resp:  20 16 16   Temp: 97.7 F (36.5 C) 98.4 F (36.9 C) 98.2 F (36.8 C) (!) 97.4 F (36.3 C)  TempSrc: Oral Oral Oral Oral  SpO2: 97% 94% 96% 96%  Weight:      Height:        General: Pt is alert, awake, not in acute distress Cardiovascular: RRR, S1/S2 +, no rubs, no gallops Respiratory: CTA bilaterally, no wheezing, no rhonchi Abdominal: Soft, NT, ND, bowel sounds + Extremities: no edema, no cyanosis    The results of significant diagnostics from this hospitalization (including imaging, microbiology, ancillary and laboratory) are listed below for reference.     Microbiology: No results found for this or any previous visit (from the past 240 hours).   Labs: BNP (last 3 results) No results for input(s): BNP in the last 8760 hours. Basic Metabolic Panel: Recent Labs  Lab 07/11/24 1245 07/12/24 0346 07/15/24 1013 07/16/24 0213 07/17/24 0825  NA 140 142 138 139 142  K 5.0 4.7 4.0 3.7 4.0  CL 111 111 105 112* 112*  CO2 20* 24 19* 22 25  GLUCOSE 123* 97 140* 103* 91  BUN 47* 58* 50* 35* 19  CREATININE 1.55* 1.38* 4.47* 1.97* 1.06  CALCIUM  9.4 9.1 9.4 8.1* 8.4*  MG  --   --   --   --  1.9   Liver Function Tests: Recent Labs  Lab 07/11/24 1245 07/15/24 1013  AST 20 22  ALT 17 16  ALKPHOS 48 47  BILITOT 0.8 0.9  PROT 6.2* 5.9*  ALBUMIN 3.6 3.7   Recent Labs  Lab 07/11/24 1245 07/15/24 1013  LIPASE 39 34   No results for input(s): AMMONIA in the last 168 hours. CBC: Recent Labs  Lab  07/12/24 1627 07/12/24 2352 07/15/24 1013 07/16/24 0213 07/17/24 0919  WBC 6.6 8.6 5.1 4.5 4.2  NEUTROABS  --   --   --   --  2.4  HGB 9.8* 10.0* 9.5* 7.8* 8.6*  HCT 28.8* 30.1* 28.5* 23.4* 26.3*  MCV 96.0 96.2 96.3 97.5 97.0  PLT 144* 160 139* 125* 119*   Cardiac Enzymes: No results for input(s): CKTOTAL, CKMB, CKMBINDEX, TROPONINI in the last 168 hours. BNP: Invalid input(s): POCBNP CBG: No results for input(s): GLUCAP in the last 168 hours. D-Dimer No results for input(s): DDIMER in the last 72 hours. Hgb A1c No results for input(s): HGBA1C in the last 72 hours. Lipid Profile No results for input(s): CHOL, HDL, LDLCALC, TRIG, CHOLHDL, LDLDIRECT in the last 72 hours. Thyroid  function studies No results for input(s): TSH, T4TOTAL, T3FREE, THYROIDAB in the last 72 hours.  Invalid input(s): FREET3 Anemia work up Recent Labs    07/16/24 0213 07/16/24 0907  VITAMINB12  --  636  FOLATE 29.0  --   FERRITIN 130  --   TIBC 251  --   IRON  24*  --    Urinalysis    Component Value Date/Time   COLORURINE YELLOW (A) 07/15/2024 1054   APPEARANCEUR CLEAR (A) 07/15/2024 1054   APPEARANCEUR CLEAR 03/17/2015 0928   LABSPEC 1.013 07/15/2024 1054   LABSPEC 1.016 03/17/2015 0928   PHURINE 5.0 07/15/2024 1054   GLUCOSEU NEGATIVE 07/15/2024 1054   GLUCOSEU NEGATIVE 03/17/2015 0928   HGBUR MODERATE (A) 07/15/2024 1054   BILIRUBINUR NEGATIVE 07/15/2024 1054   BILIRUBINUR NEGATIVE 03/17/2015 0928   KETONESUR NEGATIVE 07/15/2024 1054   PROTEINUR NEGATIVE 07/15/2024 1054   NITRITE NEGATIVE 07/15/2024 1054   LEUKOCYTESUR NEGATIVE 07/15/2024 1054   LEUKOCYTESUR NEGATIVE 03/17/2015 0928   Sepsis Labs Recent Labs  Lab 07/12/24 2352 07/15/24 1013 07/16/24 0213 07/17/24 0919  WBC 8.6 5.1 4.5 4.2   Microbiology No results found for this or any previous visit (from the past 240 hours).   Time coordinating discharge: 45  minutes  SIGNED:   Calvin KATHEE Robson, MD  [facility] 07/17/2024, 12:14 PM Pager   If 7PM-7AM, please contact night-coverage

## 2024-07-21 ENCOUNTER — Ambulatory Visit

## 2024-07-23 ENCOUNTER — Ambulatory Visit (INDEPENDENT_AMBULATORY_CARE_PROVIDER_SITE_OTHER): Admitting: Surgery

## 2024-07-23 ENCOUNTER — Telehealth: Payer: Self-pay | Admitting: Surgery

## 2024-07-23 ENCOUNTER — Encounter: Payer: Self-pay | Admitting: Surgery

## 2024-07-23 VITALS — BP 136/64 | HR 74 | Temp 97.6°F | Ht 72.0 in | Wt 172.6 lb

## 2024-07-23 DIAGNOSIS — K4031 Unilateral inguinal hernia, with obstruction, without gangrene, recurrent: Secondary | ICD-10-CM

## 2024-07-23 DIAGNOSIS — K4091 Unilateral inguinal hernia, without obstruction or gangrene, recurrent: Secondary | ICD-10-CM

## 2024-07-23 NOTE — Progress Notes (Signed)
 Outpatient Surgical Follow Up  07/23/2024  Gary Mueller is an 83 y.o. male.   Chief Complaint  Patient presents with   New Patient (Initial Visit)    Left inguinal hernia    HPI: Gary Mueller is a patient well-known to me with history of bilateral open inguinal hernia repairs several years ago presented recently with urine retention recovering Foley catheter.  He also had recent upper GI bleed and antiplatelet therapy was withheld.  He is currently holding his Plavix. He endorses more pain on the left inguinal area.  Intermittent worsening with Valsalva and certain activities.  He has been very active. He did have CT that I personally reviewed showing evidence of left inguinal hernia with a sliding bladder component.   Past Medical History:  Diagnosis Date   Allergy    Anxiety    Arthritis    Cancer (HCC) 10/20/2016   Basal Cell Skin Cancer; lip, neck   Cystoid macular edema of left eye 07/07/2019   Depression    Dysrhythmia    A FIB   GERD (gastroesophageal reflux disease)    HOH (hard of hearing)    Bilateral   Hyperlipidemia    Hypertension    Patient denies   Hypothyroidism    IBS (irritable bowel syndrome)    Inguinal hernia    bilateral   Pneumonia    Thyroid  disease    Tinnitus of both ears     Past Surgical History:  Procedure Laterality Date   ANTERIOR VITRECTOMY Left 11/28/2018   Procedure: ANTERIOR VITRECTOMY;  Surgeon: Ferol Rogue, MD;  Location: [facility] ORS;  Service: Ophthalmology;  Laterality: Left;   CARDIAC CATHETERIZATION     CATARACT EXTRACTION W/PHACO Right 10/10/2018   Procedure: CATARACT EXTRACTION PHACO AND INTRAOCULAR LENS PLACEMENT (IOC);  Surgeon: Ferol Rogue, MD;  Location: [facility] OR[facility];  Service: Ophthalmology;  Laterality: Right;  US  00:41 CDE 6.87 Fluid pack lot # [id number]   CATARACT EXTRACTION W/PHACO Left 11/28/2018   Procedure: CATARACT EXTRACTION PHACO AND INTRAOCULAR LENS PLACEMENT (IOC)-LEFT;  Surgeon: Ferol Rogue, MD;  Location: [facility]  ORS;  Service: Ophthalmology;  Laterality: Left;  US  00:47.7 CDE 8.92 Fluid Pack lot # [id number]   CHOLECYSTECTOMY  1968   CHONDROPLASTY Right 01/02/2018   Procedure: CHONDROPLASTY;  Surgeon: Mardee Lynwood SQUIBB, MD;  Location: [facility] OR[facility];  Service: Orthopedics;  Laterality: Right;   COLONOSCOPY  2014   COLONOSCOPY N/A 07/13/2024   Procedure: COLONOSCOPY;  Surgeon: Unk Corinn Skiff, MD;  Location: [facility] ENDOSCOPY;  Service: Gastroenterology;  Laterality: N/A;   CORONARY STENT INTERVENTION N/A 10/19/2021   Procedure: CORONARY STENT INTERVENTION;  Surgeon: Ammon Blunt, MD;  Location: [facility];  Service: Cardiovascular;  Laterality: N/A;   ESOPHAGOGASTRODUODENOSCOPY N/A 07/13/2024   Procedure: EGD (ESOPHAGOGASTRODUODENOSCOPY);  Surgeon: Unk Corinn Skiff, MD;  Location: [facility] ENDOSCOPY;  Service: Gastroenterology;  Laterality: N/A;   EYE SURGERY     HERNIA REPAIR Left 04/18/1996   inguinal hernia repair/ Dr Dessa   HERNIA REPAIR Right 02/26/1996   Dr Dessa   HERNIA REPAIR Left 08/07/2002   Dr Dessa   JOINT REPLACEMENT     KNEE ARTHROPLASTY Right 04/30/2020   Procedure: COMPUTER ASSISTED TOTAL KNEE ARTHROPLASTY;  Surgeon: Mardee Lynwood SQUIBB, MD;  Location: [facility] ORS;  Service: Orthopedics;  Laterality: Right;   KNEE ARTHROSCOPY Right 01/02/2018   Procedure: ARTHROSCOPY KNEE;  Surgeon: Mardee Lynwood SQUIBB, MD;  Location: [facility] ORS;  Service: Orthopedics;  Laterality: Right;   KNEE ARTHROSCOPY Left 02/14/2012  partial menisectomy and chondroplasty   KNEE ARTHROSCOPY WITH MEDIAL MENISECTOMY Right 01/02/2018   Procedure: KNEE ARTHROSCOPY WITH MEDIAL MENISECTOMY;  Surgeon: Mardee Lynwood SQUIBB, MD;  Location: [facility] OR[facility];  Service: Orthopedics;  Laterality: Right;   LEFT HEART CATH AND CORONARY ANGIOGRAPHY N/A 10/19/2021   Procedure: LEFT HEART CATH AND CORONARY ANGIOGRAPHY;  Surgeon: Ammon Blunt, MD;  Location: [facility];  Service: Cardiovascular;  Laterality: N/A;   LEFT  HEART CATH AND CORONARY ANGIOGRAPHY Left 10/10/2022   Procedure: LEFT HEART CATH AND CORONARY ANGIOGRAPHY;  Surgeon: Ammon Blunt, MD;  Location: [facility];  Service: Cardiovascular;  Laterality: Left;   POLYPECTOMY  07/13/2024   Procedure: POLYPECTOMY, INTESTINE;  Surgeon: Unk Corinn Skiff, MD;  Location: [facility] ENDOSCOPY;  Service: Gastroenterology;;   TONSILLECTOMY     as a child   TOTAL KNEE ARTHROPLASTY Left 03/29/2015   [facility] Dr. Mardee   VASECTOMY      Family History  Problem Relation Age of Onset   Heart disease Mother        A fib   Healthy Sister     Social History:  reports that he quit smoking about 59 years ago. His smoking use included cigarettes. He has never used smokeless tobacco. He reports current alcohol  use. He reports that he does not use drugs.  Allergies:  Allergies  Allergen Reactions   Codeine     GI intolerance; hallucinations    Statins     Muscle pain   Tamsulosin  Cough    Medications reviewed.    ROS Full ROS performed and is otherwise negative other than what is stated in HPI   BP 136/64   Pulse 74   Temp 97.6 F (36.4 C) (Oral)   Ht 6' (1.829 m)   Wt 172 lb 9.6 oz (78.3 kg)   SpO2 96%   BMI 23.41 kg/m   Physical Exam Vitals and nursing note reviewed. Exam conducted with a chaperone present.  Constitutional:      General: He is not in acute distress.    Appearance: Normal appearance. He is not ill-appearing.  Cardiovascular:     Rate and Rhythm: Normal rate and regular rhythm.     Heart sounds: No murmur heard.    No friction rub.  Pulmonary:     Effort: Pulmonary effort is normal. No respiratory distress.     Breath sounds: Normal breath sounds. No stridor. No wheezing or rhonchi.  Abdominal:     General: Abdomen is flat. There is no distension.     Palpations: Abdomen is soft. There is no mass.     Tenderness: There is no abdominal tenderness. There is no guarding or rebound.     Hernia: No hernia is  present.     Comments: LEft inguinal hernia now chronically incarcerated, no peritonitis  Musculoskeletal:     Cervical back: Normal range of motion and neck supple. No rigidity or tenderness.  Lymphadenopathy:     Cervical: No cervical adenopathy.  Skin:    General: Skin is warm and dry.     Capillary Refill: Capillary refill takes less than 2 seconds.  Neurological:     General: No focal deficit present.     Mental Status: He is alert and oriented to person, place, and time.  Psychiatric:        Mood and Affect: Mood normal.        Behavior: Behavior normal.        Thought Content: Thought content  normal.        Judgment: Judgment normal.      Assessment/Plan: Recurrent left inguinal hernia with chronic incarceration.  No evidence of bowel compromise.  No need for emergent surgical intervention however I do think that we need to move rather quickly given his symptoms.  Also because of the indwelling Foley catheter I would rather do that while he still has a Foley.  Will schedule him for surgery next week for robotic approach.  Procedure discussed with him his daughter detail.  The risk, and benefits of the possible complications were discussed with the patient detail including but not limited to: Bleeding, infection bowel or bladder injuries, pain and recurrence . He understand and wishes to proceed I personally spent a total of 45 minutes in the care of the patient today including performing a medically appropriate exam/evaluation, counseling and educating, placing orders, referring and communicating with other health care professionals, documenting clinical information in the EHR, independently interpreting and reviewing images studies and coordinating care.   Laneta Luna, MD [facility] General Surgeon

## 2024-07-23 NOTE — Patient Instructions (Signed)
 You have chose to have your hernia repaired. This will be done by Dr. Everlene Farrier at [facility].  If you are on any injectable weight loss medication, you will need to stop taking your GLP-1 injectable (weight loss) medications 8 days before your surgery to avoid any complications with anesthesia.   Please see your (blue) Pre-care information that you have been given today. Our surgery scheduler will call you to verify surgery date and to go over information.   You will need to arrange to be out of work for approximately 1-2 weeks and then you may return with a lifting restriction for 4 more weeks. If you have FMLA or Disability paperwork that needs to be filled out, please have your company fax your paperwork to [phone] or you may drop this by either office. This paperwork will be filled out within 3 days after your surgery has been completed.  You may have a bruise in your groin and also swelling and brusing in your testicle area. You may use ice 4-5 times daily for 15-20 minutes each time. Make sure that you place a barrier between you and the ice pack. To decrease the swelling, you may roll up a bath towel and place it vertically in between your thighs with your testicles resting on the towel. You will want to keep this area elevated as much as possible for several days following surgery.    Inguinal Hernia, Adult Muscles help keep everything in the body in its proper place. But if a weak spot in the muscles develops, something can poke through. That is called a hernia. When this happens in the lower part of the belly (abdomen), it is called an inguinal hernia. (It takes its name from a part of the body in this region called the inguinal canal.) A weak spot in the wall of muscles lets some fat or part of the small intestine bulge through. An inguinal hernia can develop at any age. Men get them more often than women. CAUSES  In adults, an inguinal hernia develops over time. It can be triggered  by: Suddenly straining the muscles of the lower abdomen. Lifting heavy objects. Straining to have a bowel movement. Difficult bowel movements (constipation) can lead to this. Constant coughing. This may be caused by smoking or lung disease. Being overweight. Being pregnant. Working at a job that requires long periods of standing or heavy lifting. Having had an inguinal hernia before. One type can be an emergency situation. It is called a strangulated inguinal hernia. It develops if part of the small intestine slips through the weak spot and cannot get back into the abdomen. The blood supply can be cut off. If that happens, part of the intestine may die. This situation requires emergency surgery. SYMPTOMS  Often, a small inguinal hernia has no symptoms. It is found when a healthcare provider does a physical exam. Larger hernias usually have symptoms.  In adults, symptoms may include: A lump in the groin. This is easier to see when the person is standing. It might disappear when lying down. In men, a lump in the scrotum. Pain or burning in the groin. This occurs especially when lifting, straining or coughing. A dull ache or feeling of pressure in the groin. Signs of a strangulated hernia can include: A bulge in the groin that becomes very painful and tender to the touch. A bulge that turns red or purple. Fever, nausea and vomiting. Inability to have a bowel movement or to pass gas.  DIAGNOSIS  To decide if you have an inguinal hernia, a healthcare provider will probably do a physical examination. This will include asking questions about any symptoms you have noticed. The healthcare provider might feel the groin area and ask you to cough. If an inguinal hernia is felt, the healthcare provider may try to slide it back into the abdomen. Usually no other tests are needed. TREATMENT  Treatments can vary. The size of the hernia makes a difference. Options include: Watchful waiting. This is often  suggested if the hernia is small and you have had no symptoms. No medical procedure will be done unless symptoms develop. You will need to watch closely for symptoms. If any occur, contact your healthcare provider right away. Surgery. This is used if the hernia is larger or you have symptoms. Open surgery. This is usually an outpatient procedure (you will not stay overnight in a hospital). An cut (incision) is made through the skin in the groin. The hernia is put back inside the abdomen. The weak area in the muscles is then repaired by herniorrhaphy or hernioplasty. Herniorrhaphy: in this type of surgery, the weak muscles are sewn back together. Hernioplasty: a patch or mesh is used to close the weak area in the abdominal wall. Laparoscopy. In this procedure, a surgeon makes small incisions. A thin tube with a tiny video camera (called a laparoscope) is put into the abdomen. The surgeon repairs the hernia with mesh by looking with the video camera and using two long instruments. HOME CARE INSTRUCTIONS  After surgery to repair an inguinal hernia: You will need to take pain medicine prescribed by your healthcare provider. Follow all directions carefully. You will need to take care of the wound from the incision. Your activity will be restricted for awhile. This will probably include no heavy lifting for several weeks. You also should not do anything too active for a few weeks. When you can return to work will depend on the type of job that you have. During "watchful waiting" periods, you should: Maintain a healthy weight. Eat a diet high in fiber (fruits, vegetables and whole grains). Drink plenty of fluids to avoid constipation. This means drinking enough water and other liquids to keep your urine clear or pale yellow. Do not lift heavy objects. Do not stand for long periods of time. Quit smoking. This should keep you from developing a frequent cough. SEEK MEDICAL CARE IF:  A bulge develops in your  groin area. You feel pain, a burning sensation or pressure in the groin. This might be worse if you are lifting or straining. You develop a fever of more than 100.5 F (38.1 C). SEEK IMMEDIATE MEDICAL CARE IF:  Pain in the groin increases suddenly. A bulge in the groin gets bigger suddenly and does not go down. For men, there is sudden pain in the scrotum. Or, the size of the scrotum increases. A bulge in the groin area becomes red or purple and is painful to touch. You have nausea or vomiting that does not go away. You feel your heart beating much faster than normal. You cannot have a bowel movement or pass gas. You develop a fever of more than 102.0 F (38.9 C).   This information is not intended to replace advice given to you by your health care provider. Make sure you discuss any questions you have with your health care provider.   Document Released: 04/29/2009 Document Revised: 03/04/2012 Document Reviewed: 06/14/2015 [employer] Interactive Patient Education [employer]

## 2024-07-23 NOTE — Progress Notes (Signed)
 History of Present Illness Gary Mueller is an 83 year old male with coronary artery disease and congestive heart failure who presents for transition into care following recent hospitalizations for gastric ulcer with upper GI bleeding 7/18-7/20/25 and urinary retention due to left inguinal hernia 7/22-7/24/25.  He was hospitalized from July 18 to July 13, 2024, for a gastric ulcer and upper GI bleeding. On the morning of the incident, he felt unwell but continued with his usual activities, including playing golf. During the game, he experienced nausea and hematemesis, prompting him to return home. He vomited blood multiple times before his son called for medical assistance. An endoscopy confirmed a gastric ulcer, and a couple of polyps were found but were not considered significant at the time. He has been off anticoagulation since the bleeding incident.  He was subsequently hospitalized from July 22 to July 17, 2024, for urinary retention, during which a left inguinal hernia was identified with a portion of the urinary bladder within the hernia. He currently has a Foley catheter in place and reports discomfort from the hernia and catheter. He is scheduled for surgical repair of the hernia next week.  He has a history of coronary artery disease and congestive heart failure. No new cardiovascular symptoms are reported, and he has been monitoring his blood pressure at home, which has been stable around 130/60 mmHg.  He has chronic kidney disease stage 3A and is adequately hydrating. He is currently on Flomax , although its necessity is questioned due to the presence of the catheter.  His appetite has returned, and he is eating well. He is taking a stool softener daily after experiencing constipation post-hospitalization. He is engaging in light physical activity around the house.   Current Outpatient Medications  Medication Sig Dispense Refill  . amLODIPine  (NORVASC ) 5 MG tablet Take 1 tablet (5 mg  total) by mouth once daily 90 tablet 3  . ascorbic acid , vitamin C, (VITAMIN C) 1000 MG tablet Take 1,000 mg by mouth once daily    . cholecalciferol (VITAMIN D3) 1000 unit tablet 1,000 Units once daily       . cyanocobalamin  (VITAMIN B12) 1000 MCG tablet Take 1,000 mcg by mouth once daily    . FUROsemide (LASIX) 20 MG tablet TAKE ONE TABLET BY MOUTH ONCE DAILY AS NEEDED FOR EDEMA 30 tablet 5  . levothyroxine  (SYNTHROID ) 75 MCG tablet TAKE 1 TABLET EVERY DAY ON EMPTY STOMACHWITH A GLASS OF WATER AT LEAST 30-60 MINBEFORE BREAKFAST 30 tablet 11  . losartan  (COZAAR ) 50 MG tablet TAKE ONE TABLET BY MOUTH EVERY DAY 90 tablet 3  . magnesium  oxide (MAG-OX) 400 mg (241.3 mg magnesium ) tablet Take 400 mg by mouth    . metoprolol  SUCCinate (TOPROL -XL) 25 MG XL tablet TAKE 1/2 TABLET BY MOUTH EVERY EVENING 45 tablet 1  . multivitamin tablet Take 1 tablet by mouth once daily.    . nitroGLYcerin  (NITROSTAT ) 0.4 MG SL tablet Place 1 tablet (0.4 mg total) under the tongue every 5 (five) minutes as needed for Chest pain 25 tablet 5  . pantoprazole  (PROTONIX ) 40 MG DR tablet Take 40 mg by mouth 2 (two) times daily before meals    . rosuvastatin  (CRESTOR ) 40 MG tablet Take 1 tablet (40 mg total) by mouth once daily 90 tablet 3  . tamsulosin  (FLOMAX ) 0.4 mg capsule Take 0.4 mg by mouth once daily    . vitamin E  400 UNIT capsule Take 400 Units by mouth once daily     No current facility-administered  medications for this visit.    Allergies as of 07/24/2024 - Reviewed 07/24/2024  Allergen Reaction Noted  . Tamsulosin  Cough 05/27/2024    Past Medical History:  Diagnosis Date  . Anxiety   . Arthritis     affects right knee and right hand  . Basal cell carcinoma   . CME (cystoid macular edema), left 07/07/2019  . Coronary artery disease    Insignificant coronary artery disease by cardiac catheterization 12/01/09 revealing 30% stenosis left main.  . Gastroesophageal reflux disease without esophagitis  12/29/2021  . Hypertension   . Hypothyroidism   . Nonexudative age-related macular degeneration, bilateral, intermediate dry stage 07/07/2019  . Premature ventricular contractions    Occasional    Past Surgical History:  Procedure Laterality Date  . CHOLECYSTECTOMY  1967  . Left total knee arthoplasty using computer assisted navigation Left 03/29/2015  . Right knee arthroscopy, partial medial meniscectomy, and chondroplasty  01/02/2018   Dr Mardee  . LENS EYE SURGERY Right 09/2018   CE and PCIOL OD (elsewhere)  . LENS EYE SURGERY Left 11/28/2018   CE and PCIOL OS (elsewhere)  . INJECTION INTRAVITREAL PHARMACOLOGIC AGENT Left 06/02/2019   ASK  OS  . REPOSITIONING INTRAOCULAR LENS Left 06/10/2019   Procedure: L- REPOSITIONING OF INTRAOCULAR LENS PROSTHESIS, REQUIRING AN INCISION (SEPARATE PROCEDURE);  Surgeon: Trudy Willma Hamel, MD;  Location: [facility] OR;  Service: Ophthalmology;  Laterality: Left;  . Right total knee arthroplasty using computer-assisted navigation  04/30/2020   Dr Mardee  . HERNIA REPAIR Bilateral   . KNEE ARTHROSCOPY Left    Left Knee Arthroscopy, partial menisectomy, and chondroplasty 02/14/12  . SKIN BIOPSY    . VASECTOMY      Health Maintenance Topics with due status: Overdue     Topic Date Due   COVID-19 Vaccine 04/09/2024     Family History  Problem Relation Name Age of Onset  . Osteoporosis (Thinning of bones) Mother    . High blood pressure (Hypertension) Mother    . Myocardial Infarction (Heart attack) Maternal Grandfather    . Glaucoma Neg Hx    . Macular degeneration Neg Hx    . Diabetes Neg Hx    . Blindness Neg Hx    . Strabismus Neg Hx      Social History   Socioeconomic History  . Marital status: Widowed    Spouse name: Jenkins  . Number of children: 3  . Years of education: 14  Occupational History  . Occupation: Retired- Education officer, community  Tobacco Use  . Smoking status: Former    Current packs/day: 0.00    Average  packs/day: 3.0 packs/day for 5.0 years (15.0 ttl pk-yrs)    Types: Cigarettes    Start date: 65    Quit date: 57    Years since quitting: 60.6    Passive exposure: Past  . Smokeless tobacco: Never  Vaping Use  . Vaping status: Never Used  Substance and Sexual Activity  . Alcohol  use: Not Currently  . Drug use: No  . Sexual activity: Defer    Partners: Female   Social Drivers of Health   Financial Resource Strain: Low Risk  (04/26/2024)   Overall Financial Resource Strain (CARDIA)   . Difficulty of Paying Living Expenses: Not hard at all  Food Insecurity: No Food Insecurity (07/15/2024)   Received from [facility]   Hunger Vital Sign   . Within the past 12 months, you worried that your food would run out before you got  the money to buy more.: Never true   . Within the past 12 months, the food you bought just didn't last and you didn't have money to get more.: Never true  Transportation Needs: No Transportation Needs (07/15/2024)   Received from [facility] - Transportation   . In the past 12 months, has lack of transportation kept you from medical appointments or from getting medications?: No   . In the past 12 months, has lack of transportation kept you from meetings, work, or from getting things needed for daily living?: No     Goals Addressed             This Visit's Progress   . Maintain health/healthy lifestyle   On track        BP 130/68   Pulse 55   Ht 182.9 cm (6')   Wt 79.1 kg (174 lb 6.4 oz)   SpO2 95%   BMI 23.65 kg/m   General. Well-developed, well-nourished patient, in no distress   Neck. Trachea midline.  No thyromegaly Lungs.  Reduced airflow to the right base, chronic without wheeze or retractions. Cardiovascular.Regular rate and rhythm without murmurs, gallops, or rubs.  Carotid and radial pulses 2+.  Abdomen. Soft; non distended; mild tenderness to deep palpation without masses or organomegaly..  Left inguinal hernia Extremities.No  clubbing, cyanosis, or edema Neurologic.  Hearing aids in place.  Decreased visual acuity, unchanged.    Recurrent left inguinal hernia  (primary encounter diagnosis) Acute gastric ulcer with hemorrhage Essential hypertension Coronary artery disease involving native coronary artery of native heart without angina pectoris Prediabetes Hypothyroidism due to acquired atrophy of thyroid  Hyperlipidemia, unspecified hyperlipidemia type Seronegative inflammatory arthritis CKD stage 3a, GFR 45-59 ml/min (CMS/HHS-HCC) Exudative age-related macular degeneration, unspecified laterality, unspecified stage (CMS/HHS-HCC)  Hospital notes, labs, imaging study results reviewed Assessment & Plan Acute gastric ulcer with upper gastrointestinal bleeding Recent hospitalization for gastric ulcer and upper GI bleeding. Symptoms improved, no current abdominal pain. Off anticoagulation due to bleeding risk. Proton pump inhibitor therapy ongoing. - Continue proton pump inhibitor therapy. - Adhere to bland diet, advance as tolerated. - Schedule follow-up with GI for potential repeat endoscopy to assess healing and determine timing for resumption of anticoagulation.  Left inguinal hernia with bladder involvement and urinary retention Left inguinal hernia with bladder involvement causing urinary retention. Foley catheter in place. Surgery scheduled for next week. Off anticoagulation due to upcoming surgery. - Maintain Foley catheter until surgery. - Proceed with scheduled hernia repair surgery next week.  Systolic congestive heart failure No signs of fluid overload or new cardiovascular symptoms. Aware of increased cardiovascular risk due to being off anticoagulation, unavoidable at present.  Limit sodium.  Follow weight [place].  Coronary artery disease No recent chest pain or cardiovascular symptoms. Off anticoagulation due to recent GI bleeding and upcoming surgery.  Follow-up with cardiology as planned.  Monitor  heart rate and blood pressure at home  Chronic kidney disease stage 3a Adequate hydration maintained. Monitoring of urine output facilitated by current catheterization. - Regular follow-up of metabolic panel, urinalysis, micro PTH, and phosphorus.  Essential hypertension Blood pressure well-controlled, recent readings around 130/60 mmHg. - Continue monitoring closely over the next 1-2 weeks.  Dyslipidemia Continue current treatment regimen. - Regular monitoring of liver function and lipid levels.  Hypothyroidism due to acquired atrophy of thyroid  Continue current treatment regimen. - Regular monitoring of thyroid  function.  Seronegative inflammatory arthritis No active issues or synovitis reported.  Exudative age-related macular  degeneration, unspecified eye Vision stable. - Regular follow-up with ophthalmology.  Follow-up as scheduled, returning sooner if needed  CODE STATUS.  Patient is DNR   Gary JACK KLEIN III, MD  Portions of this note were created using dictation software and may contain typographical errors.    This note has been created using automated tools and reviewed for accuracy by Gary Mueller.

## 2024-07-23 NOTE — Telephone Encounter (Signed)
 Patient has been advised of Pre-Admission date/time, and Surgery date at [facility].  Surgery Date: 07/29/24 Preadmission Testing Date: 07/24/24 (phone 1p-4p)  Patient informed of the scheduling process and surgery information given at time of office visit.   Patient has been made aware to call [phone], between 1-3:00pm the day before surgery, to find out what time to arrive for surgery.

## 2024-07-23 NOTE — H&P (View-Only) (Signed)
 Outpatient Surgical Follow Up  07/23/2024  Gary Mueller is an 83 y.o. male.   Chief Complaint  Patient presents with   New Patient (Initial Visit)    Left inguinal hernia    HPI: Gary Mueller is a patient well-known to me with history of bilateral open inguinal hernia repairs several years ago presented recently with urine retention recovering Foley catheter.  He also had recent upper GI bleed and antiplatelet therapy was withheld.  He is currently holding his Plavix. He endorses more pain on the left inguinal area.  Intermittent worsening with Valsalva and certain activities.  He has been very active. He did have CT that I personally reviewed showing evidence of left inguinal hernia with a sliding bladder component.   Past Medical History:  Diagnosis Date   Allergy    Anxiety    Arthritis    Cancer (HCC) 10/20/2016   Basal Cell Skin Cancer; lip, neck   Cystoid macular edema of left eye 07/07/2019   Depression    Dysrhythmia    A FIB   GERD (gastroesophageal reflux disease)    HOH (hard of hearing)    Bilateral   Hyperlipidemia    Hypertension    Patient denies   Hypothyroidism    IBS (irritable bowel syndrome)    Inguinal hernia    bilateral   Pneumonia    Thyroid  disease    Tinnitus of both ears     Past Surgical History:  Procedure Laterality Date   ANTERIOR VITRECTOMY Left 11/28/2018   Procedure: ANTERIOR VITRECTOMY;  Surgeon: Ferol Rogue, MD;  Location: [facility] ORS;  Service: Ophthalmology;  Laterality: Left;   CARDIAC CATHETERIZATION     CATARACT EXTRACTION W/PHACO Right 10/10/2018   Procedure: CATARACT EXTRACTION PHACO AND INTRAOCULAR LENS PLACEMENT (IOC);  Surgeon: Ferol Rogue, MD;  Location: [facility] ORS;  Service: Ophthalmology;  Laterality: Right;  US  00:41 CDE 6.87 Fluid pack lot # [id number]   CATARACT EXTRACTION W/PHACO Left 11/28/2018   Procedure: CATARACT EXTRACTION PHACO AND INTRAOCULAR LENS PLACEMENT (IOC)-LEFT;  Surgeon: Ferol Rogue, MD;  Location: [facility]  ORS;  Service: Ophthalmology;  Laterality: Left;  US  00:47.7 CDE 8.92 Fluid Pack lot # [id number]   CHOLECYSTECTOMY  1968   CHONDROPLASTY Right 01/02/2018   Procedure: CHONDROPLASTY;  Surgeon: Mardee Lynwood SQUIBB, MD;  Location: [facility] ORS;  Service: Orthopedics;  Laterality: Right;   COLONOSCOPY  2014   COLONOSCOPY N/A 07/13/2024   Procedure: COLONOSCOPY;  Surgeon: Unk Corinn Skiff, MD;  Location: [facility] ENDOSCOPY;  Service: Gastroenterology;  Laterality: N/A;   CORONARY STENT INTERVENTION N/A 10/19/2021   Procedure: CORONARY STENT INTERVENTION;  Surgeon: Ammon Blunt, MD;  Location: [facility] INVASIVE CV LAB;  Service: Cardiovascular;  Laterality: N/A;   ESOPHAGOGASTRODUODENOSCOPY N/A 07/13/2024   Procedure: EGD (ESOPHAGOGASTRODUODENOSCOPY);  Surgeon: Unk Corinn Skiff, MD;  Location: [facility] ENDOSCOPY;  Service: Gastroenterology;  Laterality: N/A;   EYE SURGERY     HERNIA REPAIR Left 04/18/1996   inguinal hernia repair/ Dr Dessa   HERNIA REPAIR Right 02/26/1996   Dr Dessa   HERNIA REPAIR Left 08/07/2002   Dr Dessa   JOINT REPLACEMENT     KNEE ARTHROPLASTY Right 04/30/2020   Procedure: COMPUTER ASSISTED TOTAL KNEE ARTHROPLASTY;  Surgeon: Mardee Lynwood SQUIBB, MD;  Location: [facility] ORS;  Service: Orthopedics;  Laterality: Right;   KNEE ARTHROSCOPY Right 01/02/2018   Procedure: ARTHROSCOPY KNEE;  Surgeon: Mardee Lynwood SQUIBB, MD;  Location: [facility] ORS;  Service: Orthopedics;  Laterality: Right;   KNEE ARTHROSCOPY Left 02/14/2012  partial menisectomy and chondroplasty   KNEE ARTHROSCOPY WITH MEDIAL MENISECTOMY Right 01/02/2018   Procedure: KNEE ARTHROSCOPY WITH MEDIAL MENISECTOMY;  Surgeon: Mardee Lynwood SQUIBB, MD;  Location: [facility] ORS;  Service: Orthopedics;  Laterality: Right;   LEFT HEART CATH AND CORONARY ANGIOGRAPHY N/A 10/19/2021   Procedure: LEFT HEART CATH AND CORONARY ANGIOGRAPHY;  Surgeon: Ammon Blunt, MD;  Location: [facility] INVASIVE CV LAB;  Service: Cardiovascular;  Laterality: N/A;   LEFT  HEART CATH AND CORONARY ANGIOGRAPHY Left 10/10/2022   Procedure: LEFT HEART CATH AND CORONARY ANGIOGRAPHY;  Surgeon: Ammon Blunt, MD;  Location: [facility] INVASIVE CV LAB;  Service: Cardiovascular;  Laterality: Left;   POLYPECTOMY  07/13/2024   Procedure: POLYPECTOMY, INTESTINE;  Surgeon: Unk Corinn Skiff, MD;  Location: [facility] ENDOSCOPY;  Service: Gastroenterology;;   TONSILLECTOMY     as a child   TOTAL KNEE ARTHROPLASTY Left 03/29/2015   [facility] Dr. Mardee   VASECTOMY      Family History  Problem Relation Age of Onset   Heart disease Mother        A fib   Healthy Sister     Social History:  reports that he quit smoking about 59 years ago. His smoking use included cigarettes. He has never used smokeless tobacco. He reports current alcohol  use. He reports that he does not use drugs.  Allergies:  Allergies  Allergen Reactions   Codeine     GI intolerance; hallucinations    Statins     Muscle pain   Tamsulosin  Cough    Medications reviewed.    ROS Full ROS performed and is otherwise negative other than what is stated in HPI   BP 136/64   Pulse 74   Temp 97.6 F (36.4 C) (Oral)   Ht 6' (1.829 m)   Wt 172 lb 9.6 oz (78.3 kg)   SpO2 96%   BMI 23.41 kg/m   Physical Exam Vitals and nursing note reviewed. Exam conducted with a chaperone present.  Constitutional:      General: He is not in acute distress.    Appearance: Normal appearance. He is not ill-appearing.  Cardiovascular:     Rate and Rhythm: Normal rate and regular rhythm.     Heart sounds: No murmur heard.    No friction rub.  Pulmonary:     Effort: Pulmonary effort is normal. No respiratory distress.     Breath sounds: Normal breath sounds. No stridor. No wheezing or rhonchi.  Abdominal:     General: Abdomen is flat. There is no distension.     Palpations: Abdomen is soft. There is no mass.     Tenderness: There is no abdominal tenderness. There is no guarding or rebound.     Hernia: No hernia is  present.     Comments: LEft inguinal hernia now chronically incarcerated, no peritonitis  Musculoskeletal:     Cervical back: Normal range of motion and neck supple. No rigidity or tenderness.  Lymphadenopathy:     Cervical: No cervical adenopathy.  Skin:    General: Skin is warm and dry.     Capillary Refill: Capillary refill takes less than 2 seconds.  Neurological:     General: No focal deficit present.     Mental Status: He is alert and oriented to person, place, and time.  Psychiatric:        Mood and Affect: Mood normal.        Behavior: Behavior normal.        Thought Content: Thought content  normal.        Judgment: Judgment normal.      Assessment/Plan: Recurrent left inguinal hernia with chronic incarceration.  No evidence of bowel compromise.  No need for emergent surgical intervention however I do think that we need to move rather quickly given his symptoms.  Also because of the indwelling Foley catheter I would rather do that while he still has a Foley.  Will schedule him for surgery next week for robotic approach.  Procedure discussed with him his daughter detail.  The risk, and benefits of the possible complications were discussed with the patient detail including but not limited to: Bleeding, infection bowel or bladder injuries, pain and recurrence . He understand and wishes to proceed I personally spent a total of 45 minutes in the care of the patient today including performing a medically appropriate exam/evaluation, counseling and educating, placing orders, referring and communicating with other health care professionals, documenting clinical information in the EHR, independently interpreting and reviewing images studies and coordinating care.   Laneta Luna, MD [facility] General Surgeon

## 2024-07-24 ENCOUNTER — Encounter
Admission: RE | Admit: 2024-07-24 | Discharge: 2024-07-24 | Disposition: A | Discharge status: Home / Self Care | Source: Ambulatory Visit | Attending: Surgery | Admitting: Surgery

## 2024-07-24 ENCOUNTER — Encounter: Admitting: Occupational Therapy

## 2024-07-24 ENCOUNTER — Other Ambulatory Visit: Payer: Self-pay

## 2024-07-24 HISTORY — DX: Atherosclerotic heart disease of native coronary artery without angina pectoris: I25.10

## 2024-07-24 HISTORY — DX: Unspecified right bundle-branch block: I45.10

## 2024-07-24 HISTORY — DX: Unilateral inguinal hernia, without obstruction or gangrene, recurrent: K40.91

## 2024-07-24 HISTORY — DX: Chronic kidney disease, stage 3a: N18.31

## 2024-07-24 HISTORY — DX: Dyspnea, unspecified: R06.00

## 2024-07-24 HISTORY — DX: Prediabetes: R73.03

## 2024-07-24 HISTORY — DX: Exudative age-related macular degeneration, unspecified eye, stage unspecified: H35.3290

## 2024-07-24 HISTORY — DX: Acute gastric ulcer with hemorrhage: K25.0

## 2024-07-24 HISTORY — DX: Heart failure, unspecified: I50.9

## 2024-07-24 HISTORY — DX: Anemia, unspecified: D64.9

## 2024-07-24 HISTORY — DX: Acute myocardial infarction, unspecified: I21.9

## 2024-07-24 NOTE — Patient Instructions (Addendum)
 Your procedure is scheduled on: 07/29/24 - Tuesday Report to the Registration Desk on the 1st floor of the Medical Mall. To find out your arrival time, please call [phone] between 1PM - 3PM on: 07/28/24 - Monday If your arrival time is 6:00 am, do not arrive before that time as the Medical Mall entrance doors do not open until 6:00 am.  REMEMBER: Instructions that are not followed completely may result in serious medical risk, up to and including death; or upon the discretion of your surgeon and anesthesiologist your surgery may need to be rescheduled.  Do not eat food or drink any liquids after midnight the night before surgery.  No gum chewing or hard candies.   One week prior to surgery: Stop Anti-inflammatories (NSAIDS) such as Advil, Aleve, Ibuprofen, Motrin, Naproxen, Naprosyn and Aspirin  based products such as Excedrin, Goody's Powder, BC Powder. You may continue to take Tylenol  if needed for pain up until the day of surgery.  Stop ANY OVER THE COUNTER supplements until after surgery : Multiple Vitamin   ON THE DAY OF SURGERY ONLY TAKE THESE MEDICATIONS WITH SIPS OF WATER:  amLODipine  (NORVASC )  levothyroxine  (SYNTHROID )  pantoprazole  (PROTONIX )    No Alcohol  for 24 hours before or after surgery.  No Smoking including e-cigarettes for 24 hours before surgery.  No chewable tobacco products for at least 6 hours before surgery.  No nicotine patches on the day of surgery.  Do not use any recreational drugs for at least a week (preferably 2 weeks) before your surgery.  Please be advised that the combination of cocaine and anesthesia may have negative outcomes, up to and including death. If you test positive for cocaine, your surgery will be cancelled.  On the morning of surgery brush your teeth with toothpaste and water, you may rinse your mouth with mouthwash if you wish. Do not swallow any toothpaste or mouthwash.  Do not wear jewelry, make-up, hairpins, clips or  nail polish.  For welded (permanent) jewelry: bracelets, anklets, waist bands, etc.  Please have this removed prior to surgery.  If it is not removed, there is a chance that hospital personnel will need to cut it off on the day of surgery.  Do not wear lotions, powders, or perfumes.   Do not shave body hair from the neck down 48 hours before surgery.  Contact lenses, hearing aids and dentures may not be worn into surgery.  Do not bring valuables to the hospital. [facility] is not responsible for any missing/lost belongings or valuables.   Notify your doctor if there is any change in your medical condition (cold, fever, infection).  Wear comfortable clothing (specific to your surgery type) to the hospital.  After surgery, you can help prevent lung complications by doing breathing exercises.  Take deep breaths and cough every 1-2 hours. Your doctor may order a device called an Incentive Spirometer to help you take deep breaths.  When coughing or sneezing, hold a pillow firmly against your incision with both hands. This is called "splinting." Doing this helps protect your incision. It also decreases belly discomfort.  If you are being admitted to the hospital overnight, leave your suitcase in the car. After surgery it may be brought to your room.  In case of increased patient census, it may be necessary for you, the patient, to continue your postoperative care in the Same Day Surgery department.  If you are being discharged the day of surgery, you will not be allowed to drive home.  You will need a responsible individual to drive you home and stay with you for 24 hours after surgery.   If you are taking public transportation, you will need to have a responsible individual with you.  Please call the Pre-admissions Testing Dept. at [phone] if you have any questions about these instructions.  Surgery Visitation Policy:  Patients having surgery or a procedure may have two visitors.   Children under the age of 37 must have an adult with them who is not the patient.  Inpatient Visitation:    Visiting hours are 7 a.m. to 8 p.m. Up to four visitors are allowed at one time in a patient room. The visitors may rotate out with other people during the day.  One visitor age 8 or older may stay with the patient overnight and must be in the room by 8 p.m.   Merchandiser, retail to address health-related social needs:  [email or web address]

## 2024-07-24 NOTE — Progress Notes (Signed)
 Goals     . Maintain health/healthy lifestyle

## 2024-07-25 ENCOUNTER — Encounter: Payer: Self-pay | Admitting: Surgery

## 2024-07-25 NOTE — Progress Notes (Signed)
 Perioperative / Anesthesia Services  Pre-Admission Testing Clinical Review / Pre-Operative Anesthesia Consult  Date: 07/25/24  PATIENT DEMOGRAPHICS: Name: Gary Mueller DOB: Jun 15, 1941 MRN:   [id number]  Note: Available PAT nursing documentation and vital signs have been reviewed. Clinical nursing staff has updated patient's PMH/PSHx, current medication list, and drug allergies/intolerances to ensure complete and comprehensive history available to assist care teams in MDM as it pertains to the aforementioned surgical procedure and anticipated anesthetic course. Extensive review of available clinical information personally performed.  PMH and PSHx updated with any diagnoses/procedures that  may have been inadvertently omitted during his intake with the pre-admission testing department's nursing staff.  PLANNED SURGICAL PROCEDURE(S):   Case: [id number] Date/Time: 07/29/24 0715   Procedure: HERNIORRHAPHY, INGUINAL, ROBOT-ASSISTED, LAPAROSCOPIC (Left: Inguinal)   Anesthesia type: General   Diagnosis: Unilateral recurrent inguinal hernia without obstruction or gangrene [K40.91]   Pre-op diagnosis: recurrent inguinal hernia   Location: [facility] OR ROOM 07 / [facility]S FOR AN[facility]   Surgeons: Jordis Laneta FALCON, MD        CLINICAL DISCUSSION: Gary Mueller is a 83 y.o. male who is submitted for pre-surgical anesthesia review and clearance prior to him undergoing the above procedure. Patient is a Former Smoker (quit 11/1964). Pertinent PMH includes: CAD, NSTEMI, ischemic cardiomyopathy, CHF, atrial fibrillation, symptomatic PVCs, RBBB, aortic atherosclerosis, HTN, HLD, pre-diabetes, hypothyroidism, CKD-III, DOE, GERD (on daily PPI), bleeding gastric ulcer, anemia, BILATERAL inguinal hernia, BPH, lumbar levoscoliosis, OA, depression, anxiety.   Patient is followed by cardiology Andrea, MD). He was last seen in the cardiology clinic on 04/08/2024; notes reviewed. At the time of his  clinic visit, patient doing well overall from a cardiovascular perspective.  Dyspnea that was reported to be stable and at baseline.  Chest patient denied any chest pain, shortness of breath, PND, orthopnea, palpitations, significant peripheral edema, weakness, fatigue, vertiginous symptoms, or presyncope/syncope. Patient with a past medical history significant for cardiovascular diagnoses. Documented physical exam was grossly benign, providing no evidence of acute exacerbation and/or decompensation of the patient's known cardiovascular conditions.  The patient suffered an NSTEMI on 10/18/2021. Troponins were trended and peaked at a 22 ng/L.  Patient underwent diagnostic LEFT heart catheterization on 10/19/2021 that demonstrated a moderately reduced left ventricular systolic function with an EF of 35-45%. Study revealed multivessel CAD; 20% proximal RCA, 30% ostial-mid LM, 100% proximal LCx, 50% proximal LAD, and 40% D1.  PCI was subsequently performed placing a 2.5 x 22 mm Onyx Frontier DES x 1 to the proximal LCx lesion yielding excellent angiographic result and TIMI-3 flow.  Repeat diagnostic LEFT heart catheterization was performed on 07/10/2022 revealing an improved left ventricular systolic function with an EF of 50-55%.  Again, multivessel CAD was noted; 50% proximal LCx, 20% proximal RCA, 40% ostial-mid LM, 60% D1, and 20% mid RCA.  Previously placed stent to the LCx was widely patent.  No further intervention deemed necessary at that time.  Recommendations were for aggressive risk factor modification and medical management of patient's coronary artery disease.  Most recent myocardial perfusion imaging study was performed on 04/01/2024 revealing mildly reduced left ventricular systolic function with an EF of 48%.  There were no regional wall motion abnormalities.  No artifact .left ventricular cavity size was noted to be enlarged appreciated on review of imaging. SPECT images demonstrated a moderate  mixed perfusion abnormality of mild intensity in the inferior region on stress images consistent with scar. TID ratio = 1.10. Study determined to be normal and  low risk.  Most recent TTE performed on 07/15/2024 revealed a normal left ventricular systolic function with an EF of 55-60%. There were no regional wall motion abnormalities. Left ventricular diastolic Doppler parameters consistent with abnormal relaxation (G1DD). Right ventricular size and function normal with a TAPSE measuring 2.8 cm  (normal range >/= 1.6 cm). There was mild mitral and tricuspid valve regurgitation.  All transvalvular gradients were noted to be normal providing no evidence of hemodynamically significant valvular stenosis. Aorta normal in size with no evidence of ectasia or aneurysmal dilatation.  Patient with an atrial fibrillation diagnosis; CHA2DS2-VASc Score = 5 (age x 2 CHF, HTN, prior MI/vascular disease). His rate and rhythm are currently being maintained on oral metoprolol  succinate; not on chronic OAC. Following NSTEMI, patient remains on daily antithrombotic therapy using clopidogrel.  Patient reportedly compliant with therapy with no evidence of reports of GI/GU related bleeding. Blood pressure well controlled at 128/74 mmHg on currently prescribed CCB (amlodipine ) and diuretic (furosemide) therapies.  Patient is on rosuvastatin  for his HLD diagnosis and ASCVD prevention.  Patient has a prediabetes diagnosis that he is managing with diet and lifestyle modifications.  Most recent hemoglobin A1c was 5.7% when checked on 05/20/2024.  Functional capacity somewhat limited by patient's exertional dyspnea.  With that said, he is able to complete all of his ADLs/IADLs without significant cardiovascular limitation.  Per the [facility], patient is able to exceed 4 METS physical activity without experiencing any significant degree of angina/anginal equivalent symptoms. No changes were made to his medication regimen during his visit with  cardiology.  Patient scheduled to follow-up with outpatient cardiology in 4 months or sooner if needed.  Gary Mueller is scheduled for an elective HERNIORRHAPHY, INGUINAL, ROBOT-ASSISTED, LAPAROSCOPIC (Left: Inguinal) on 07/29/2024 with Dr. Laneta Luna, MD.  Given patient's past medical history significant for cardiovascular diagnoses, presurgical cardiac clearance was sought by the PAT team. Per cardiology, this patient is optimized for surgery and may proceed with the planned procedural course with a LOW risk of significant perioperative cardiovascular complications.  Again, this patient is on daily antithrombotic therapy.  He has been instructed on recommendations for holding his clopidogrel prior to his procedure with plans to restart as soon as postoperative bleeding risk felt to be minimized by his primary attending surgeon.  During this PAT interview, reminded that patient's clopidogrel dose on hold following recent admission for GI bleeding.  He reports that his last dose of clopidogrel was taken on 07/13/2024.  In review of his discharge note, medicine noted that clopidogrel on hold pending outpatient follow-up with cardiology.  Patient denies previous perioperative complications with anesthesia in the past. In review his EMR, it is noted that patient underwent a general anesthetic course here at [facility] (ASA III) in 06/2024 without documented complications.   MOST RECENT VITAL SIGNS:    07/23/2024   11:04 AM 07/17/2024    8:53 AM 07/17/2024    3:41 AM  Vitals with BMI  Height 6' 0    Weight 172 lbs 10 oz    BMI 23.4    Systolic 136 132 868  Diastolic 64 55 59  Pulse 74 56 56   PROVIDERS/SPECIALISTS: NOTE: Primary physician provider listed below. Patient may have been seen by APP or partner within same practice.   PROVIDER ROLE / SPECIALTY LAST Gary Luna Laneta JULIANNA, MD General Surgery (Surgeon) 07/23/2024  Bertrum Charlie LITTIE, MD Primary Care  Provider 07/24/2024  Ammon Blunt, MD Cardiology 04/08/2024  Parris Manna, MD Pulmonary Medicine 05/26/2024  Tobie Solo, MD Rheumatology 05/05/2024   ALLERGIES: Allergies  Allergen Reactions   Codeine     GI intolerance; hallucinations    Statins     Muscle pain   Tamsulosin  Cough    CURRENT HOME MEDICATIONS: No current facility-administered medications for this encounter.    acetaminophen  (TYLENOL ) 500 MG tablet   amLODipine  (NORVASC ) 5 MG tablet   cyanocobalamin  1000 MCG tablet   furosemide (LASIX) 20 MG tablet   levothyroxine  (SYNTHROID ) 75 MCG tablet   metoprolol  succinate (TOPROL -XL) 25 MG 24 hr tablet   rosuvastatin  (CRESTOR ) 40 MG tablet   tamsulosin  (FLOMAX ) 0.4 MG CAPS capsule   ascorbic acid  (VITAMIN C) 1000 MG tablet   [Paused] clopidogrel (PLAVIX) 75 MG tablet   Multiple Vitamin (MULTIVITAMIN WITH MINERALS) TABS tablet   pantoprazole  (PROTONIX ) 40 MG tablet   HISTORY: Past Medical History:  Diagnosis Date   Acute gastric ulcer with hemorrhage    Allergy    Anemia    Anxiety    Aortic atherosclerosis (HCC)    Arthritis    Atrial fibrillation (HCC)    a.) CHA2DS2VASc = 5 (age x 2, CHF, HTN, prior MI/vascular disease) as of 07/25/2024; b.) rate/rhythm maintained on oral metoprolol  succinate; no OAC (does take clopidogrel)   BILATERAL recurrent inguinal hernia    a.) s/p repair RIGHT 02/1996; b.) s/p repair LEFT 03/1996; c.) s/p repair recurrent LEFT 07/2002; d.) planned repair of second recurrence on LEFT 07/2024   BPH (benign prostatic hyperplasia)    CHF (congestive heart failure) (HCC)    CKD stage 3a, GFR 45-59 ml/min (HCC)    Colon polyps    Coronary artery disease    a.) s/p NSTEMI 10/18/2021 with PCI of LCx 10/19/2021 (2.5 x 22 mm Onyx Frontier DES)   Cystoid macular edema of left eye 07/07/2019   Depression    Diverticulosis    DOE (dyspnea on exertion)    Exudative age related macular degeneration (HCC)    GERD (gastroesophageal  reflux disease)    HOH (hard of hearing)    Hyperlipidemia    Hypertension    Hypothyroidism    IBS (irritable bowel syndrome)    Ischemic cardiomyopathy    Levoscoliosis of lumbar spine    Long term current use of clopidogrel    NSTEMI (non-ST elevated myocardial infarction) (HCC) 10/18/2021   a.) Troponin peaked at 822 ng/L; b.) LHC 10/19/2021: subtotal occlusion of the LCx (2.5 x 22 mm Onyx Frontier DES)   Pneumonia    Pre-diabetes    RBBB    Right bundle branch block (RBBB)    Skin cancer, basal cell 10/20/2016   a.) lip, neck   Status post bilateral cataract extraction 2019   Symptomatic PVCs    Tinnitus of both ears    Past Surgical History:  Procedure Laterality Date   ANTERIOR VITRECTOMY Left 11/28/2018   Procedure: ANTERIOR VITRECTOMY;  Surgeon: Ferol Rogue, MD;  Location: [facility] ORS;  Service: Ophthalmology;  Laterality: Left;   CATARACT EXTRACTION W/PHACO Right 10/10/2018   Procedure: CATARACT EXTRACTION PHACO AND INTRAOCULAR LENS PLACEMENT (IOC);  Surgeon: Ferol Rogue, MD;  Location: [facility] ORS;  Service: Ophthalmology;  Laterality: Right;  US  00:41 CDE 6.87 Fluid pack lot # [id number]   CATARACT EXTRACTION W/PHACO Left 11/28/2018   Procedure: CATARACT EXTRACTION PHACO AND INTRAOCULAR LENS PLACEMENT (IOC)-LEFT;  Surgeon: Ferol Rogue, MD;  Location: [facility] ORS;  Service: Ophthalmology;  Laterality: Left;  US  00:47.7 CDE 8.92 Fluid  Pack lot # [id number]   CHOLECYSTECTOMY  1968   CHONDROPLASTY Right 01/02/2018   Procedure: CHONDROPLASTY;  Surgeon: Mardee Lynwood SQUIBB, MD;  Location: [facility] ORS;  Service: Orthopedics;  Laterality: Right;   COLONOSCOPY  2014   COLONOSCOPY N/A 07/13/2024   Procedure: COLONOSCOPY;  Surgeon: Unk Corinn Skiff, MD;  Location: [facility] ENDOSCOPY;  Service: Gastroenterology;  Laterality: N/A;   CORONARY STENT INTERVENTION N/A 10/19/2021   Procedure: CORONARY STENT INTERVENTION;  Surgeon: Ammon Blunt, MD;  Location: [facility] INVASIVE CV LAB;   Service: Cardiovascular;  Laterality: N/A;   ESOPHAGOGASTRODUODENOSCOPY N/A 07/13/2024   Procedure: EGD (ESOPHAGOGASTRODUODENOSCOPY);  Surgeon: Unk Corinn Skiff, MD;  Location: [facility] ENDOSCOPY;  Service: Gastroenterology;  Laterality: N/A;   HERNIA REPAIR Left 04/18/1996   inguinal hernia repair/ Dr Dessa   HERNIA REPAIR Right 02/26/1996   Dr Dessa   HERNIA REPAIR Left 08/07/2002   Dr Dessa   JOINT REPLACEMENT     KNEE ARTHROPLASTY Right 04/30/2020   Procedure: COMPUTER ASSISTED TOTAL KNEE ARTHROPLASTY;  Surgeon: Mardee Lynwood SQUIBB, MD;  Location: [facility] ORS;  Service: Orthopedics;  Laterality: Right;   KNEE ARTHROSCOPY Right 01/02/2018   Procedure: ARTHROSCOPY KNEE;  Surgeon: Mardee Lynwood SQUIBB, MD;  Location: [facility] ORS;  Service: Orthopedics;  Laterality: Right;   KNEE ARTHROSCOPY Left 02/14/2012   partial menisectomy and chondroplasty   KNEE ARTHROSCOPY WITH MEDIAL MENISECTOMY Right 01/02/2018   Procedure: KNEE ARTHROSCOPY WITH MEDIAL MENISECTOMY;  Surgeon: Mardee Lynwood SQUIBB, MD;  Location: [facility] ORS;  Service: Orthopedics;  Laterality: Right;   LEFT HEART CATH AND CORONARY ANGIOGRAPHY N/A 10/19/2021   Procedure: LEFT HEART CATH AND CORONARY ANGIOGRAPHY;  Surgeon: Ammon Blunt, MD;  Location: [facility] INVASIVE CV LAB;  Service: Cardiovascular;  Laterality: N/A;   LEFT HEART CATH AND CORONARY ANGIOGRAPHY Left 10/10/2022   Procedure: LEFT HEART CATH AND CORONARY ANGIOGRAPHY;  Surgeon: Ammon Blunt, MD;  Location: [facility] INVASIVE CV LAB;  Service: Cardiovascular;  Laterality: Left;   POLYPECTOMY  07/13/2024   Procedure: POLYPECTOMY, INTESTINE;  Surgeon: Unk Corinn Skiff, MD;  Location: [facility] ENDOSCOPY;  Service: Gastroenterology;;   TONSILLECTOMY     as a child   TOTAL KNEE ARTHROPLASTY Left 03/29/2015   [facility] Dr. Mardee   VASECTOMY     Family History  Problem Relation Age of Onset   Heart disease Mother        A fib   Healthy Sister    Social History   Tobacco Use    Smoking status: Former    Current packs/day: 0.00    Types: Cigarettes    Quit date: 12/24/1964    Years since quitting: 59.6   Smokeless tobacco: Never  Substance Use Topics   Alcohol  use: Yes    Comment: 0-1 beer or glass of wine monthly   LABS:  Lab Results  Component Value Date   WBC 4.2 07/17/2024   HGB 8.6 (L) 07/17/2024   HCT 26.3 (L) 07/17/2024   MCV 97.0 07/17/2024   PLT 119 (L) 07/17/2024   Lab Results  Component Value Date   NA 142 07/17/2024   CL 112 (H) 07/17/2024   K 4.0 07/17/2024   CO2 25 07/17/2024   BUN 19 07/17/2024   CREATININE 1.06 07/17/2024   GFRNONAA >60 07/17/2024   CALCIUM  8.4 (L) 07/17/2024   ALBUMIN 3.7 07/15/2024   GLUCOSE 91 07/17/2024    ECG: Date: 07/15/2024  Time ECG obtained: 1013 AM Rate: 67 bpm Rhythm: normal sinus; RBBB Axis (leads I and aVF): left Intervals:  PR 192 ms. QRS 146 ms. QTc 469 ms. ST segment and T wave changes: No evidence of acute T wave abnormalities or significant ST segment elevation or depression.  Evidence of a possible, age undetermined, prior infarct:  No Comparison: Similar to previous tracing obtained on 07/11/2024   IMAGING / PROCEDURES: TRANSTHORACIC ECHOCARDIOGRAM performed on 07/15/2024 Left ventricular ejection fraction, by estimation, is 55 to 60%. The left ventricle has normal function. The left ventricle has no regional wall motion abnormalities. Left ventricular diastolic parameters are consistent with Grade I diastolic dysfunction (impaired relaxation).  Right ventricular systolic function is normal. The right ventricular size is normal.  The mitral valve is normal in structure. Mild mitral valve regurgitation. No evidence of mitral stenosis.  The aortic valve is normal in structure. Aortic valve regurgitation is not visualized. No aortic stenosis is present.  The inferior vena cava is normal in size with greater than 50% respiratory variability, suggesting right atrial pressure of 3 mmHg.   CT  ABDOMEN PELVIS WO CONTRAST performed on 07/15/2024 Left inguinal hernia containing a portion of the urinary bladder with increasing stranding in the fat since prior exam. Recommend correlation for incarceration. Urology consultation is recommended. Unchanged bilateral perinephric edema Colonic diverticulosis without diverticulitis. Enlarged prostate.  CT ANGIO GI BLEED performed on 07/12/2024 No evidence of active GI bleed. Sigmoid diverticulosis. Left anterolateral urinary bladder mass or diverticulum extending into the left inguinal hernia. Consider urologic consultation. Probable AVN in the right femoral head without subchondral collapse. Aortic atherosclerosis  LEFT HEART CATHETERIZATION AND CORONARY ANGIOGRAPHY performed on 10/10/2022 Low normal left ventricular systolic function with an EF of 50-55% Multivessel CAD Prox LAD lesion is 50% stenosed. Prox RCA lesion is 20% stenosed. Ost LM to Mid LM lesion is 40% stenosed. 1st Diag lesion is 60% stenosed. Mid RCA lesion is 20% stenosed. Non-stenotic Prox Cx lesion was previously treated. Recommendations Medical therapy. Aggressive risk factor modification   IMPRESSION AND PLAN: ABIE CHEEK has been referred for pre-anesthesia review and clearance prior to him undergoing the planned anesthetic and procedural courses. Available labs, pertinent testing, and imaging results were personally reviewed by me in preparation for upcoming operative/procedural course. [facility] medical record has been updated following extensive record review and patient interview with PAT staff.   This patient has been appropriately cleared by cardiology with an overall LOW risk of patient experiencing significant perioperative cardiovascular complications. Based on clinical review performed today (07/25/24), barring any significant acute changes in the patient's overall condition, it is anticipated that he will be able to proceed with the planned surgical  intervention. Any acute changes in clinical condition may necessitate his procedure being postponed and/or cancelled. Patient will meet with anesthesia team (MD and/or CRNA) on the day of his procedure for preoperative evaluation/assessment. Questions regarding anesthetic course will be fielded at that time.   Pre-surgical instructions were reviewed with the patient during his PAT appointment, and questions were fielded to satisfaction by PAT clinical staff. He has been instructed on which medications that he will need to hold prior to surgery, as well as the ones that have been deemed safe/appropriate to take on the day of his procedure. As part of the general education provided by PAT, patient made aware both verbally and in writing, that he would need to abstain from the use of any illegal substances during his perioperative course. He was advised that failure to follow the provided instructions could necessitate case cancellation or result in serious perioperative complications up to  and including death. Patient encouraged to contact PAT and/or his surgeon's office to discuss any questions or concerns that may arise prior to surgery; verbalized understanding.   Dorise Pereyra, MSN, APRN, FNP-C, C[facility]  Perioperative Services Nurse Practitioner Phone: [phone] Fax: [phone] 07/25/24 12:49 PM  NOTE: This note has been prepared using Dragon dictation software. Despite my best ability to proofread, there is always the potential that unintentional transcriptional errors may still occur from this process.

## 2024-07-28 ENCOUNTER — Encounter: Payer: Self-pay | Admitting: Surgery

## 2024-07-28 ENCOUNTER — Ambulatory Visit: Admitting: Physician Assistant

## 2024-07-28 ENCOUNTER — Encounter

## 2024-07-29 ENCOUNTER — Ambulatory Visit: Admitting: Physician Assistant

## 2024-07-29 ENCOUNTER — Ambulatory Visit: Payer: Self-pay | Admitting: Urgent Care

## 2024-07-29 ENCOUNTER — Other Ambulatory Visit: Payer: Self-pay

## 2024-07-29 ENCOUNTER — Encounter
Admission: RE | Disposition: A | Discharge status: Home / Self Care | Payer: Self-pay | Source: Home / Self Care | Attending: Surgery

## 2024-07-29 ENCOUNTER — Ambulatory Visit
Admission: RE | Admit: 2024-07-29 | Discharge: 2024-07-29 | Disposition: A | Discharge status: Home / Self Care | Attending: Surgery | Admitting: Surgery

## 2024-07-29 ENCOUNTER — Encounter: Payer: Self-pay | Admitting: Surgery

## 2024-07-29 DIAGNOSIS — Z79899 Other long term (current) drug therapy: Secondary | ICD-10-CM | POA: Insufficient documentation

## 2024-07-29 DIAGNOSIS — Z79890 Hormone replacement therapy (postmenopausal): Secondary | ICD-10-CM | POA: Insufficient documentation

## 2024-07-29 DIAGNOSIS — M1990 Unspecified osteoarthritis, unspecified site: Secondary | ICD-10-CM | POA: Diagnosis not present

## 2024-07-29 DIAGNOSIS — I493 Ventricular premature depolarization: Secondary | ICD-10-CM | POA: Insufficient documentation

## 2024-07-29 DIAGNOSIS — F419 Anxiety disorder, unspecified: Secondary | ICD-10-CM | POA: Insufficient documentation

## 2024-07-29 DIAGNOSIS — I509 Heart failure, unspecified: Secondary | ICD-10-CM | POA: Insufficient documentation

## 2024-07-29 DIAGNOSIS — F32A Depression, unspecified: Secondary | ICD-10-CM | POA: Insufficient documentation

## 2024-07-29 DIAGNOSIS — K4031 Unilateral inguinal hernia, with obstruction, without gangrene, recurrent: Secondary | ICD-10-CM | POA: Insufficient documentation

## 2024-07-29 DIAGNOSIS — K4091 Unilateral inguinal hernia, without obstruction or gangrene, recurrent: Secondary | ICD-10-CM

## 2024-07-29 DIAGNOSIS — I4891 Unspecified atrial fibrillation: Secondary | ICD-10-CM | POA: Diagnosis not present

## 2024-07-29 DIAGNOSIS — N401 Enlarged prostate with lower urinary tract symptoms: Secondary | ICD-10-CM | POA: Insufficient documentation

## 2024-07-29 DIAGNOSIS — R7303 Prediabetes: Secondary | ICD-10-CM | POA: Insufficient documentation

## 2024-07-29 DIAGNOSIS — K219 Gastro-esophageal reflux disease without esophagitis: Secondary | ICD-10-CM | POA: Diagnosis not present

## 2024-07-29 DIAGNOSIS — Z87891 Personal history of nicotine dependence: Secondary | ICD-10-CM | POA: Insufficient documentation

## 2024-07-29 DIAGNOSIS — E039 Hypothyroidism, unspecified: Secondary | ICD-10-CM | POA: Insufficient documentation

## 2024-07-29 DIAGNOSIS — I255 Ischemic cardiomyopathy: Secondary | ICD-10-CM | POA: Diagnosis not present

## 2024-07-29 DIAGNOSIS — I2510 Atherosclerotic heart disease of native coronary artery without angina pectoris: Secondary | ICD-10-CM | POA: Diagnosis not present

## 2024-07-29 DIAGNOSIS — I252 Old myocardial infarction: Secondary | ICD-10-CM | POA: Insufficient documentation

## 2024-07-29 DIAGNOSIS — N1831 Chronic kidney disease, stage 3a: Secondary | ICD-10-CM | POA: Insufficient documentation

## 2024-07-29 DIAGNOSIS — Z7902 Long term (current) use of antithrombotics/antiplatelets: Secondary | ICD-10-CM | POA: Diagnosis not present

## 2024-07-29 DIAGNOSIS — M419 Scoliosis, unspecified: Secondary | ICD-10-CM | POA: Insufficient documentation

## 2024-07-29 DIAGNOSIS — I4510 Unspecified right bundle-branch block: Secondary | ICD-10-CM | POA: Diagnosis not present

## 2024-07-29 DIAGNOSIS — Z8711 Personal history of peptic ulcer disease: Secondary | ICD-10-CM | POA: Insufficient documentation

## 2024-07-29 DIAGNOSIS — R338 Other retention of urine: Secondary | ICD-10-CM | POA: Insufficient documentation

## 2024-07-29 DIAGNOSIS — I130 Hypertensive heart and chronic kidney disease with heart failure and stage 1 through stage 4 chronic kidney disease, or unspecified chronic kidney disease: Secondary | ICD-10-CM | POA: Insufficient documentation

## 2024-07-29 DIAGNOSIS — E785 Hyperlipidemia, unspecified: Secondary | ICD-10-CM | POA: Diagnosis not present

## 2024-07-29 HISTORY — DX: Diverticulosis of intestine, part unspecified, without perforation or abscess without bleeding: K57.90

## 2024-07-29 HISTORY — DX: Unspecified atrial fibrillation: I48.91

## 2024-07-29 HISTORY — PX: HERNIORRHAPHY, INGUINAL, ROBOT-ASSISTED, LAPAROSCOPIC: SHX7585

## 2024-07-29 HISTORY — PX: INSERTION OF MESH: SHX5868

## 2024-07-29 HISTORY — DX: Other forms of scoliosis, lumbar region: M41.86

## 2024-07-29 HISTORY — DX: Ischemic cardiomyopathy: I25.5

## 2024-07-29 HISTORY — DX: Long term (current) use of antithrombotics/antiplatelets: Z79.02

## 2024-07-29 HISTORY — DX: Benign prostatic hyperplasia without lower urinary tract symptoms: N40.0

## 2024-07-29 HISTORY — DX: Ventricular premature depolarization: I49.3

## 2024-07-29 HISTORY — DX: Unspecified right bundle-branch block: I45.10

## 2024-07-29 HISTORY — DX: Bilateral inguinal hernia, without obstruction or gangrene, not specified as recurrent: K40.20

## 2024-07-29 HISTORY — DX: Atherosclerosis of aorta: I70.0

## 2024-07-29 HISTORY — DX: Polyp of colon: K63.5

## 2024-07-29 HISTORY — DX: Other forms of dyspnea: R06.09

## 2024-07-29 SURGERY — HERNIORRHAPHY, INGUINAL, ROBOT-ASSISTED, LAPAROSCOPIC
Anesthesia: General | Site: Inguinal | Laterality: Left

## 2024-07-29 MED ORDER — FENTANYL CITRATE (PF) 100 MCG/2ML IJ SOLN
INTRAMUSCULAR | Status: AC
Start: 2024-07-29 — End: 2024-07-29
  Filled 2024-07-29: qty 2

## 2024-07-29 MED ORDER — ROCURONIUM BROMIDE 100 MG/10ML IV SOLN
INTRAVENOUS | Status: DC | PRN
  Administered 2024-07-29: 25 mg via INTRAVENOUS
  Administered 2024-07-29: 50 mg via INTRAVENOUS

## 2024-07-29 MED ORDER — EPHEDRINE SULFATE-NACL 50-0.9 MG/10ML-% IV SOSY
PREFILLED_SYRINGE | INTRAVENOUS | Status: DC | PRN
  Administered 2024-07-29: 10 mg via INTRAVENOUS
  Administered 2024-07-29: 5 mg via INTRAVENOUS

## 2024-07-29 MED ORDER — DEXAMETHASONE SODIUM PHOSPHATE 10 MG/ML IJ SOLN
INTRAMUSCULAR | Status: AC
  Filled 2024-07-29: qty 1

## 2024-07-29 MED ORDER — GABAPENTIN 300 MG PO CAPS
ORAL_CAPSULE | ORAL | Status: AC
  Filled 2024-07-29: qty 1

## 2024-07-29 MED ORDER — KETOROLAC TROMETHAMINE 30 MG/ML IJ SOLN
INTRAMUSCULAR | Status: DC | PRN
Start: 2024-07-29 — End: 2024-07-29
  Administered 2024-07-29: 30 mg via INTRAVENOUS

## 2024-07-29 MED ORDER — ROCURONIUM BROMIDE 10 MG/ML (PF) SYRINGE
PREFILLED_SYRINGE | INTRAVENOUS | Status: AC
  Filled 2024-07-29: qty 10

## 2024-07-29 MED ORDER — BUPIVACAINE LIPOSOME 1.3 % IJ SUSP
INTRAMUSCULAR | Status: DC | PRN
  Administered 2024-07-29: 20 mL

## 2024-07-29 MED ORDER — DROPERIDOL 2.5 MG/ML IJ SOLN: 0.6250 mg | Freq: Once | INTRAMUSCULAR | Status: DC | PRN

## 2024-07-29 MED ORDER — ACETAMINOPHEN 500 MG PO TABS
ORAL_TABLET | ORAL | Status: AC
  Filled 2024-07-29: qty 2

## 2024-07-29 MED ORDER — CHLORHEXIDINE GLUCONATE CLOTH 2 % EX PADS
6.0000 | MEDICATED_PAD | Freq: Once | CUTANEOUS | Status: AC
  Administered 2024-07-29: 6 via TOPICAL

## 2024-07-29 MED ORDER — OXYCODONE HCL 5 MG PO TABS
ORAL_TABLET | ORAL | Status: AC
Start: 2024-07-29 — End: 2024-07-29
  Filled 2024-07-29: qty 1

## 2024-07-29 MED ORDER — OXYCODONE HCL 5 MG PO TABS
5.0000 mg | ORAL_TABLET | Freq: Once | ORAL | Status: AC
  Administered 2024-07-29: 5 mg via ORAL

## 2024-07-29 MED ORDER — LACTATED RINGERS IV SOLN: INTRAVENOUS | Status: DC | PRN

## 2024-07-29 MED ORDER — CEFAZOLIN SODIUM-DEXTROSE 2-4 GM/100ML-% IV SOLN
2.0000 g | INTRAVENOUS | Status: AC
  Administered 2024-07-29: 2 g via INTRAVENOUS

## 2024-07-29 MED ORDER — FENTANYL CITRATE (PF) 100 MCG/2ML IJ SOLN
INTRAMUSCULAR | Status: DC | PRN
  Administered 2024-07-29: 50 ug via INTRAVENOUS
  Administered 2024-07-29: 100 ug via INTRAVENOUS
  Administered 2024-07-29: 50 ug via INTRAVENOUS

## 2024-07-29 MED ORDER — BUPIVACAINE LIPOSOME 1.3 % IJ SUSP
INTRAMUSCULAR | Status: AC
  Filled 2024-07-29: qty 20

## 2024-07-29 MED ORDER — PROPOFOL 10 MG/ML IV BOLUS
INTRAVENOUS | Status: AC
  Filled 2024-07-29: qty 20

## 2024-07-29 MED ORDER — SEVOFLURANE IN SOLN
RESPIRATORY_TRACT | Status: AC
  Filled 2024-07-29: qty 250

## 2024-07-29 MED ORDER — LIDOCAINE HCL (PF) 2 % IJ SOLN
INTRAMUSCULAR | Status: AC
  Filled 2024-07-29: qty 5

## 2024-07-29 MED ORDER — ONDANSETRON HCL 4 MG/2ML IJ SOLN
INTRAMUSCULAR | Status: DC | PRN
  Administered 2024-07-29: 4 mg via INTRAVENOUS

## 2024-07-29 MED ORDER — HYDROCODONE-ACETAMINOPHEN 5-325 MG PO TABS: 1.0000 | ORAL_TABLET | Freq: Four times a day (QID) | ORAL | 0 refills | Status: DC | PRN

## 2024-07-29 MED ORDER — SUGAMMADEX SODIUM 200 MG/2ML IV SOLN
INTRAVENOUS | Status: DC | PRN
  Administered 2024-07-29: 200 mg via INTRAVENOUS

## 2024-07-29 MED ORDER — ORAL CARE MOUTH RINSE: 15.0000 mL | Freq: Once | OROMUCOSAL | Status: AC

## 2024-07-29 MED ORDER — BUPIVACAINE-EPINEPHRINE 0.25% -1:200000 IJ SOLN
INTRAMUSCULAR | Status: DC | PRN
  Administered 2024-07-29: 30 mL

## 2024-07-29 MED ORDER — PROPOFOL 1000 MG/100ML IV EMUL
INTRAVENOUS | Status: AC
  Filled 2024-07-29: qty 100

## 2024-07-29 MED ORDER — PROPOFOL 500 MG/50ML IV EMUL
INTRAVENOUS | Status: DC | PRN
  Administered 2024-07-29: 125 ug/kg/min via INTRAVENOUS

## 2024-07-29 MED ORDER — ACETAMINOPHEN 500 MG PO TABS
1000.0000 mg | ORAL_TABLET | ORAL | Status: AC
  Administered 2024-07-29: 1000 mg via ORAL

## 2024-07-29 MED ORDER — CHLORHEXIDINE GLUCONATE 0.12 % MT SOLN
15.0000 mL | Freq: Once | OROMUCOSAL | Status: AC
  Administered 2024-07-29: 15 mL via OROMUCOSAL

## 2024-07-29 MED ORDER — CHLORHEXIDINE GLUCONATE 0.12 % MT SOLN
OROMUCOSAL | Status: AC
  Filled 2024-07-29: qty 15

## 2024-07-29 MED ORDER — GABAPENTIN 300 MG PO CAPS
300.0000 mg | ORAL_CAPSULE | ORAL | Status: AC
  Administered 2024-07-29: 300 mg via ORAL

## 2024-07-29 MED ORDER — PROPOFOL 10 MG/ML IV BOLUS
INTRAVENOUS | Status: DC | PRN
  Administered 2024-07-29: 100 mg via INTRAVENOUS

## 2024-07-29 MED ORDER — LACTATED RINGERS IV SOLN: INTRAVENOUS | Status: DC

## 2024-07-29 MED ORDER — FENTANYL CITRATE (PF) 100 MCG/2ML IJ SOLN: 25.0000 ug | INTRAMUSCULAR | Status: DC | PRN

## 2024-07-29 MED ORDER — KETOROLAC TROMETHAMINE 30 MG/ML IJ SOLN
INTRAMUSCULAR | Status: AC
  Filled 2024-07-29: qty 1

## 2024-07-29 MED ORDER — BUPIVACAINE-EPINEPHRINE (PF) 0.25% -1:200000 IJ SOLN
INTRAMUSCULAR | Status: AC
  Filled 2024-07-29: qty 30

## 2024-07-29 MED ORDER — PHENYLEPHRINE 80 MCG/ML (10ML) SYRINGE FOR IV PUSH (FOR BLOOD PRESSURE SUPPORT)
PREFILLED_SYRINGE | INTRAVENOUS | Status: DC | PRN
  Administered 2024-07-29 (×2): 80 ug via INTRAVENOUS

## 2024-07-29 MED ORDER — DEXAMETHASONE SODIUM PHOSPHATE 10 MG/ML IJ SOLN
INTRAMUSCULAR | Status: DC | PRN
  Administered 2024-07-29: 10 mg via INTRAVENOUS

## 2024-07-29 MED ORDER — ONDANSETRON HCL 4 MG/2ML IJ SOLN
INTRAMUSCULAR | Status: AC
  Filled 2024-07-29: qty 2

## 2024-07-29 MED ORDER — LIDOCAINE HCL (CARDIAC) PF 100 MG/5ML IV SOSY
PREFILLED_SYRINGE | INTRAVENOUS | Status: DC | PRN
  Administered 2024-07-29: 100 mg via INTRAVENOUS

## 2024-07-29 MED ORDER — CEFAZOLIN SODIUM-DEXTROSE 2-4 GM/100ML-% IV SOLN
INTRAVENOUS | Status: AC
  Filled 2024-07-29: qty 100

## 2024-07-29 SURGICAL SUPPLY — 37 items
CANNULA REDUCER 12-8 DVNC XI (CANNULA) ×2 IMPLANT
COVER TIP SHEARS 8 DVNC (MISCELLANEOUS) ×2 IMPLANT
COVER WAND RF STERILE (DRAPES) ×2 IMPLANT
DEFOGGER SCOPE WARM SEASHARP (MISCELLANEOUS) ×2 IMPLANT
DERMABOND ADVANCED .7 DNX12 (GAUZE/BANDAGES/DRESSINGS) ×2 IMPLANT
DRAPE ARM DVNC X/XI (DISPOSABLE) ×6 IMPLANT
DRAPE COLUMN DVNC XI (DISPOSABLE) ×2 IMPLANT
ELECTRODE REM PT RTRN 9FT ADLT (ELECTROSURGICAL) ×2 IMPLANT
FORCEPS BPLR R/ABLATION 8 DVNC (INSTRUMENTS) ×2 IMPLANT
FORCEPS PROGRASP DVNC XI (FORCEP) ×2 IMPLANT
GLOVE BIO SURGEON STRL SZ7 (GLOVE) ×8 IMPLANT
GOWN STRL REUS W/ TWL LRG LVL3 (GOWN DISPOSABLE) ×8 IMPLANT
IRRIGATION STRYKERFLOW (MISCELLANEOUS) ×2 IMPLANT
IV NS 1000ML BAXH (IV SOLUTION) IMPLANT
KIT PINK PAD W/HEAD ARM REST (MISCELLANEOUS) ×2 IMPLANT
LABEL OR SOLS (LABEL) ×2 IMPLANT
MANIFOLD NEPTUNE II (INSTRUMENTS) ×2 IMPLANT
MESH 3DMAX 4X6 RT LRG (Mesh General) IMPLANT
NDL DRIVE SUT CUT DVNC (INSTRUMENTS) ×2 IMPLANT
NDL HYPO 22X1.5 SAFETY MO (MISCELLANEOUS) ×2 IMPLANT
NEEDLE DRIVE SUT CUT DVNC (INSTRUMENTS) ×2 IMPLANT
NEEDLE HYPO 22X1.5 SAFETY MO (MISCELLANEOUS) ×2 IMPLANT
OBTURATOR OPTICALSTD 8 DVNC (TROCAR) ×2 IMPLANT
PACK LAP CHOLECYSTECTOMY (MISCELLANEOUS) ×2 IMPLANT
SCISSORS MNPLR CVD DVNC XI (INSTRUMENTS) ×2 IMPLANT
SEAL UNIV 5-12 XI (MISCELLANEOUS) ×4 IMPLANT
SET TUBE SMOKE EVAC HIGH FLOW (TUBING) ×2 IMPLANT
SOLUTION ELECTROSURG ANTI STCK (MISCELLANEOUS) ×2 IMPLANT
SPONGE T-LAP 18X18 ~~LOC~~+RFID (SPONGE) ×2 IMPLANT
SUT MNCRL AB 4-0 PS2 18 (SUTURE) ×2 IMPLANT
SUT STRATA 3-0 SH (SUTURE) ×4 IMPLANT
SUT VIC AB 2-0 SH 27XBRD (SUTURE) IMPLANT
SUT VICRYL 0 UR6 27IN ABS (SUTURE) ×4 IMPLANT
SYR 30ML LL (SYRINGE) ×2 IMPLANT
TAPE TRANSPORE STRL 2 31045 (GAUZE/BANDAGES/DRESSINGS) ×2 IMPLANT
TRAP FLUID SMOKE EVACUATOR (MISCELLANEOUS) ×2 IMPLANT
WATER STERILE IRR 500ML POUR (IV SOLUTION) ×2 IMPLANT

## 2024-07-29 NOTE — Anesthesia Preprocedure Evaluation (Signed)
 Anesthesia Evaluation  Patient identified by MRN, date of birth, ID band Patient awake    Reviewed: Allergy & Precautions, NPO status , Patient's Chart, lab work & pertinent test results  History of Anesthesia Complications Negative for: history of anesthetic complications  Airway Mallampati: II       Dental  (+) Dental Advidsory Given, Missing, Chipped, Teeth Intact   Pulmonary shortness of breath and with exertion, neg sleep apnea, neg COPD, neg recent URI, Not current smoker, former smoker          Cardiovascular Exercise Tolerance: Good hypertension, Pt. on medications (-) angina + CAD, + Past MI and + Cardiac Stents  (-) CHF + dysrhythmias (hx of afib) Atrial Fibrillation (-) Valvular Problems/Murmurs  ECHO 07/15/24: 1. Left ventricular ejection fraction, by estimation, is 55 to 60%. The left ventricle has normal function. The left ventricle has no regional wall motion abnormalities. Left ventricular diastolic parameters are consistent with Grade I diastolic dysfunction (impaired relaxation).   2. Right ventricular systolic function is normal. The right ventricular size is normal.   3. The mitral valve is normal in structure. Mild mitral valve regurgitation. No evidence of mitral stenosis.   4. The aortic valve is normal in structure. Aortic valve regurgitation is not visualized. No aortic stenosis is present.   5. The inferior vena cava is normal in size with greater than 50% respiratory variability, suggesting right atrial pressure of 3 mmHg.      Neuro/Psych neg Seizures PSYCHIATRIC DISORDERS Anxiety Depression       GI/Hepatic Neg liver ROS,GERD  Medicated and Controlled,,  Endo/Other  neg diabetesHypothyroidism    Renal/GU Renal disease (CKD stage 3a)     Musculoskeletal   Abdominal   Peds  Hematology   Anesthesia Other Findings Past Medical History: No date: Allergy No date: Anxiety No date:  Arthritis 10/20/2016: Cancer [facility])     Comment:  Basal Cell Skin Cancer; lip, neck 07/07/2019: Cystoid macular edema of left eye No date: Depression No date: Dysrhythmia     Comment:  A FIB No date: GERD (gastroesophageal reflux disease) No date: HOH (hard of hearing)     Comment:  Bilateral No date: Hyperlipidemia No date: Hypertension     Comment:  Patient denies No date: Hypothyroidism No date: IBS (irritable bowel syndrome) No date: Inguinal hernia     Comment:  bilateral No date: Pneumonia No date: Thyroid  disease No date: Tinnitus of both ears   Reproductive/Obstetrics                              Anesthesia Physical Anesthesia Plan  ASA: 3  Anesthesia Plan: General   Post-op Pain Management:    Induction: Intravenous  PONV Risk Score and Plan: 2 and Treatment may vary due to age or medical condition, Ondansetron  and Dexamethasone   Airway Management Planned: Oral ETT  Additional Equipment:   Intra-op Plan:   Post-operative Plan: Extubation in OR  Informed Consent: I have reviewed the patients History and Physical, chart, labs and discussed the procedure including the risks, benefits and alternatives for the proposed anesthesia with the patient or authorized representative who has indicated his/her understanding and acceptance.       Plan Discussed with:   Anesthesia Plan Comments:          Anesthesia Quick Evaluation

## 2024-07-29 NOTE — Transfer of Care (Signed)
 Immediate Anesthesia Transfer of Care Note  Patient: Gary Mueller  Procedure(s) Performed: HERNIORRHAPHY, INGUINAL, ROBOT-ASSISTED, LAPAROSCOPIC (Left: Inguinal) INSERTION OF MESH  Patient Location: PACU  Anesthesia Type:General  Level of Consciousness: drowsy and patient cooperative  Airway & Oxygen  Therapy: Patient Spontanous Breathing and Patient connected to face mask oxygen   Post-op Assessment: Report given to RN and Post -op Vital signs reviewed and stable  Post vital signs: Reviewed and stable  Last Vitals:  Vitals Value Taken Time  BP 128/47 07/29/24 09:15  Temp    Pulse 69 07/29/24 09:24  Resp 21 07/29/24 09:24  SpO2 92 % 07/29/24 09:24  Vitals shown include unfiled device data.  Last Pain:  Vitals:   07/29/24 0622  TempSrc: Temporal         Complications: There were no known notable events for this encounter.

## 2024-07-29 NOTE — Discharge Instructions (Signed)
 Laparoscopic Surgery for Groin Hernia in Adults: What to Expect  Laparoscopic surgery for groin hernia is a surgery to treat a weak spot in the groin muscles where tissue from inside your belly pushes out. Groin hernia is also called inguinal hernia.  This surgery may be planned or it may be done as an emergency surgery. During the surgery, tissue that has pushed out of the belly is moved back into place. The opening in the groin muscles is closed.  Laparoscopic surgery is done through small cuts in the belly. A scope with a light and camera (laparoscope) is used. Tell a health care provider about: Any allergies you have. All medicines you're taking. These include vitamins, herbs, eye drops, and creams. Any problems you or family members have had with anesthesia. Any bleeding problems you have. Any surgeries you've had. Any medical problems you have. Whether you're pregnant or may be pregnant. What are the risks? Your health care provider will talk with you about risks. These may include: Infection. Bleeding, blood clots, or fluid build up in the area of the hernia. Allergy to medicines or the mesh, if a mesh was used. Damage to nearby structures in the belly. Long-term pain and swelling of the scrotum. Trouble peeing or pooping. The hernia coming back after the surgery. In some cases, your provider may need to switch from a laparoscopic surgery to one large cut in the groin (open surgery). You may need an open surgery if: You have a hernia that's hard to repair. You bleed a lot during the laparoscopic surgery. You have problems when gas is put into your belly. What happens before? When to stop eating and drinking Eat and drink only as you've been told. You may be told this: 8 hours before your surgery Stop eating most foods. Do not eat meat, fried foods, or fatty foods. Eat only light foods such as toast or crackers. All liquids are OK except energy drinks and alcohol . 6 hours  before your surgery Stop eating. Drink only clear liquids, such as water, clear fruit juice, black coffee, plain tea, and sports drinks. Do not drink energy drinks or alcohol . 2 hours before your surgery Stop drinking all liquids. You may be allowed to take medicines with small sips of water. If you do not eat and drink as told, your surgery may be delayed or canceled. Medicines Ask about changing or stopping: Any medicines you take. Any vitamins, herbs, or supplements you take. Do not take aspirin  or ibuprofen unless you're told to. Surgery safety For your safety, you may: Need to wash your skin with a soap that kills germs. Get antibiotics. Have your surgery site marked. Have hair removed at the surgery site. General instructions Do not smoke, vape, or use nicotine or tobacco for at least 4 weeks before your surgery. Ask if you'll be staying overnight in the hospital. If you'll be going home right after the surgery, plan to have a responsible adult: Drive you home from the hospital or clinic. You won't be allowed to drive. Stay with you for the time you're told. What happens during laparoscopic surgery for groin hernia? An IV will be put into a vein in your hand or arm. You may be given: A sedative to help you relax. Anesthesia to keep you from feeling pain. Three small cuts will be made in your belly. Gas will be put into your belly through one of the cuts. This will make it easier for your surgeon to see inside your  belly during the surgery. A laparoscope will be put into one of the cuts in your belly. This will send pictures to a screen in the operating room. The instruments needed for the surgery will be put through the other cuts. The tissue that make up the hernia may be removed or moved back into place. The edges of the hernia may be stitched together. A piece of mesh may be used to close the hernia. Stitches or clips will be used to keep the mesh in place. The  instruments and laparoscope will be removed. Your cuts will be closed with stitches, skin glue, or tape strips. A bandage may be placed over your cuts. These steps may vary. Ask what you can expect. What happens after? You'll be watched closely until you leave. This includes checking your pain level, blood pressure, heart rate, and breathing rate. You'll continue to receive fluids and medicines through an IV. Your IV will be removed when you can drink clear fluids. This information is not intended to replace advice given to you by your health care provider. Make sure you discuss any questions you have with your health care provider. Document Revised: 09/25/2023 Document Reviewed: 09/25/2023 Elsevier Patient Education  2025 [employer].

## 2024-07-29 NOTE — Anesthesia Procedure Notes (Signed)
 Procedure Name: Intubation Date/Time: 07/29/2024 7:41 AM  Performed by: Blair Suzen BRAVO, RNPre-anesthesia Checklist: Patient identified, Emergency Drugs available, Suction available and Patient being monitored Patient Re-evaluated:Patient Re-evaluated prior to induction Oxygen  Delivery Method: Circle system utilized Preoxygenation: Pre-oxygenation with 100% oxygen  Induction Type: IV induction Ventilation: Mask ventilation without difficulty Laryngoscope Size: McGrath and 4 Grade View: Grade I Tube type: Oral Tube size: 7.5 mm Number of attempts: 1 Airway Equipment and Method: Stylet and Oral airway Placement Confirmation: ETT inserted through vocal cords under direct vision, positive ETCO2 and breath sounds checked- equal and bilateral Secured at: 22 cm Tube secured with: Tape Dental Injury: Teeth and Oropharynx as per pre-operative assessment

## 2024-07-29 NOTE — Interval H&P Note (Signed)
 History and Physical Interval Note:  07/29/2024 7:28 AM  Gary Mueller  has presented today for surgery, with the diagnosis of recurrent inguinal hernia.  The various methods of treatment have been discussed with the patient and family. After consideration of risks, benefits and other options for treatment, the patient has consented to  Procedure(s): HERNIORRHAPHY, INGUINAL, ROBOT-ASSISTED, LAPAROSCOPIC (Left) INSERTION OF MESH as a surgical intervention.  The patient's history has been reviewed, patient examined, no change in status, stable for surgery.  I have reviewed the patient's chart and labs.  Questions were answered to the patient's satisfaction.     Reika Callanan F Jeanna Giuffre

## 2024-07-29 NOTE — Op Note (Signed)
 Robotic assisted Laparoscopic Transabdominal Left Inguinal Hernia Repair with 3 D large Mesh       Pre-operative Diagnosis:  Recurrent and incarcerated Left Inguinal Hernia   Post-operative Diagnosis: Same   Procedure: Robotic  Laparoscopic  repair of Left inguinal hernias   Surgeon: Laneta Luna, MD FACS   Anesthesia: Gen. with endotracheal tube   Findings: Recurrent Left direct inguinal hernia w chronically incarcerated bladder Evidence of prior mesh already incorporated and adhere to the spermatic structures, old mesh left in situ     Difficult case due  to incarcerated nature of the hernia and significant adhesions and scar tissue from prior surgery    Procedure Details  The patient was seen again in the Holding Room. The benefits, complications, treatment options, and expected outcomes were discussed with the patient. The risks of bleeding, infection, recurrence of symptoms, failure to resolve symptoms, recurrence of hernia, ischemic orchitis, chronic pain syndrome or neuroma, were discussed again. The likelihood of improving the patient's symptoms with return to their baseline status is good.  The patient and/or family concurred with the proposed plan, giving informed consent.  The patient was taken to Operating Room, identified  and the procedure verified as Laparoscopic Inguinal Hernia Repair. Laterality confirmed.  A Time Out was held and the above information confirmed.   Prior to the induction of general anesthesia, antibiotic prophylaxis was administered. VTE prophylaxis was in place. General endotracheal anesthesia was then administered and tolerated well. After the induction, the abdomen was prepped with Chloraprep and draped in the sterile fashion. The patient was positioned in the supine position.    Supraumbilical incision created and cut down technique used to enter the abdominal cavity. Fascia elevated and incised and hasson trochar placed. Pneumoperitoneum obtained w/o HD  changes. No evidence of bowel injuries.  Two 8 mm placed under direct vision. The laparoscopy revealed Left direct defects. I inserted the needles and the mesh. The robot was brought ot the table and docked in the standard fashion, no collision between arms was observed. Instruments were kept under direct view at all times. We started on the Left side were a flap was created. The sac was reduced and dissected free from adjacent structures. There was significant scar tissue related to prior repair, the prior mesh was incorporated and densely adhere to the spermatic cord, We preserved the vas and the vessels, the dissection required additional significant time but I was able to slowly reduced the bladder that was chronically incarcerated within the defect, no bladder injuries encountered.. Once dissection was completed a large 3D mesh was placed and secured with two interrupted vicryl attached to the pubic tubercle. There was good coverage of the direct, indirect and femoral spaces. The flap was closed with v lock suture.  Once assuring that hemostasis was adequate the ports were removed and a figure-of-eight 0 Vicryl suture was placed at the fascial edges. 4-0 subcuticular Monocryl was used at all skin edges. Dermabond was placed.  Patient tolerated the procedure well. There were no complications. He was taken to the recovery room in stable condition.  Please note that  the work required to complete this procedure  was significantly greater than what is typically expected. It required time-consuming dissection and  a substantially increased level of effort to complete a safe surgery.             Laneta Luna, MD, FACS

## 2024-07-30 ENCOUNTER — Encounter: Payer: Self-pay | Admitting: Surgery

## 2024-07-30 NOTE — Anesthesia Postprocedure Evaluation (Signed)
 Anesthesia Post Note  Patient: Gary Mueller  Procedure(s) Performed: HERNIORRHAPHY, INGUINAL, ROBOT-ASSISTED, LAPAROSCOPIC (Left: Inguinal) INSERTION OF MESH  Patient location during evaluation: PACU Anesthesia Type: General Level of consciousness: awake and alert Pain management: pain level controlled Vital Signs Assessment: post-procedure vital signs reviewed and stable Respiratory status: spontaneous breathing, nonlabored ventilation, respiratory function stable and patient connected to nasal cannula oxygen  Cardiovascular status: blood pressure returned to baseline and stable Postop Assessment: no apparent nausea or vomiting Anesthetic complications: no   There were no known notable events for this encounter.   Last Vitals:  Vitals:   07/29/24 0945 07/29/24 1012  BP: (!) 129/53 (!) 132/50  Pulse: 63 61  Resp: 16 17  Temp:  (!) 35.8 C  SpO2: 92% 96%    Last Pain:  Vitals:   07/29/24 1012  TempSrc: Temporal  PainSc: 0-No pain                 Prentice Murphy

## 2024-07-31 ENCOUNTER — Encounter

## 2024-08-04 ENCOUNTER — Encounter: Payer: Self-pay | Admitting: Physician Assistant

## 2024-08-04 ENCOUNTER — Encounter

## 2024-08-04 ENCOUNTER — Ambulatory Visit: Admitting: Physician Assistant

## 2024-08-04 ENCOUNTER — Ambulatory Visit (INDEPENDENT_AMBULATORY_CARE_PROVIDER_SITE_OTHER): Admitting: Physician Assistant

## 2024-08-04 VITALS — BP 139/73 | HR 68 | Ht 72.0 in | Wt 172.0 lb

## 2024-08-04 DIAGNOSIS — R338 Other retention of urine: Secondary | ICD-10-CM | POA: Diagnosis not present

## 2024-08-04 LAB — URINALYSIS, COMPLETE
Bilirubin, UA: NEGATIVE
Glucose, UA: NEGATIVE
Ketones, UA: NEGATIVE
Nitrite, UA: POSITIVE — AB
Protein,UA: NEGATIVE
Specific Gravity, UA: 1.01 (ref 1.005–1.030)
Urobilinogen, Ur: 0.2 mg/dL (ref 0.2–1.0)
pH, UA: 6 (ref 5.0–7.5)

## 2024-08-04 LAB — MICROSCOPIC EXAMINATION: WBC, UA: >30 /HPF — AB (ref 0–5)

## 2024-08-04 LAB — BLADDER SCAN AMB NON-IMAGING

## 2024-08-04 NOTE — Progress Notes (Signed)
 Catheter Removal  Patient is present today for a catheter removal.  8.90ml of water was drained from the balloon. A 16FR foley cath was removed from the bladder, no complications were noted. Patient tolerated well.  Performed by: Laymon Debby LATHER  Follow up/ Additional notes: appt this afternoon

## 2024-08-04 NOTE — Patient Instructions (Signed)
 [email or web address]  Cystoscopy Cystoscopy is a procedure that is used to help diagnose and sometimes treat conditions that affect the lower urinary tract. The lower urinary tract includes the bladder and the urethra. The urethra is the tube that drains urine from the bladder. Cystoscopy is done using a thin, tube-shaped instrument with a light and camera at the end (cystoscope). The cystoscope may be hard or flexible, depending on the goal of the procedure. The cystoscope is inserted through the urethra, into the bladder. Cystoscopy may be recommended if you have: Urinary tract infections that keep coming back. Blood in the urine (hematuria). An inability to control when you urinate (urinary incontinence) or an overactive bladder. Unusual cells found in a urine sample. A blockage in the urethra, such as a urinary stone. Painful urination. An abnormality in the bladder found during an intravenous pyelogram (IVP) or CT scan. What are the risks? Generally, this is a safe procedure. However, problems may occur, including: Infection. Bleeding.  What happens during the procedure?  You will be given one or more of the following: A medicine to numb the area (local anesthetic). The area around the opening of your urethra will be cleaned. The cystoscope will be passed through your urethra into your bladder. Germ-free (sterile) fluid will flow through the cystoscope to fill your bladder. The fluid will stretch your bladder so that your health care provider can clearly examine your bladder walls. Your doctor will look at the urethra and bladder. The cystoscope will be removed The procedure may vary among health care providers  What can I expect after the procedure? After the procedure, it is common to have: Some soreness or pain in your urethra. Urinary symptoms. These include: Mild pain or burning when you urinate. Pain should stop within a few minutes after you urinate. This may last for up to a  few days after the procedure. A small amount of blood in your urine for several days. Feeling like you need to urinate but producing only a small amount of urine. Follow these instructions at home: General instructions Return to your normal activities as told by your health care provider.  Drink plenty of fluids after the procedure. Keep all follow-up visits as told by your health care provider. This is important. Contact a health care provider if you: Have pain that gets worse or does not get better with medicine, especially pain when you urinate lasting longer than 72 hours after the procedure. Have trouble urinating. Get help right away if you: Have blood clots in your urine. Have a fever or chills. Are unable to urinate. Summary Cystoscopy is a procedure that is used to help diagnose and sometimes treat conditions that affect the lower urinary tract. Cystoscopy is done using a thin, tube-shaped instrument with a light and camera at the end. After the procedure, it is common to have some soreness or pain in your urethra. It is normal to have blood in your urine after the procedure.  If you were prescribed an antibiotic medicine, take it as told by your health care provider.  This information is not intended to replace advice given to you by your health care provider. Make sure you discuss any questions you have with your health care provider. Document Revised: 12/03/2018 Document Reviewed: 12/03/2018 Elsevier Patient Education  2020 [employer].

## 2024-08-04 NOTE — Progress Notes (Signed)
 08/04/2024 3:42 PM   Gary Mueller 27-Oct-1941 [id number]  CC: Chief Complaint  Patient presents with   cath removal   HPI: Gary Mueller is a 83 y.o. male with PMH CAD, A-fib, BPH, recent upper GI bleed, possible bladder mass versus diverticulum needing further workup, and urinary retention involving a left inguinal hernia s/p repair with Dr. Jordis on 07/29/2024 who presents today for postop voiding trial.  Foley removed in the morning, see separate note.  He returned in the afternoon and has been unable to void.  He is somewhat uncomfortable.  Bladder scan on arrival .  PMH: Past Medical History:  Diagnosis Date   Acute gastric ulcer with hemorrhage    Allergy    Anemia    Anxiety    Aortic atherosclerosis (HCC)    Arthritis    Atrial fibrillation (HCC)    a.) CHA2DS2VASc = 5 (age x 2, CHF, HTN, prior MI/vascular disease) as of 07/25/2024; b.) rate/rhythm maintained on oral metoprolol  succinate; no OAC (does take clopidogrel)   BILATERAL recurrent inguinal hernia    a.) s/p repair RIGHT 02/1996; b.) s/p repair LEFT 03/1996; c.) s/p repair recurrent LEFT 07/2002; d.) planned repair of second recurrence on LEFT 07/2024   BPH (benign prostatic hyperplasia)    CHF (congestive heart failure) (HCC)    CKD stage 3a, GFR 45-59 ml/min (HCC)    Colon polyps    Coronary artery disease    a.) s/p NSTEMI 10/18/2021 with PCI of LCx 10/19/2021 (2.5 x 22 mm Onyx Frontier DES)   Cystoid macular edema of left eye 07/07/2019   Depression    Diverticulosis    DOE (dyspnea on exertion)    Exudative age related macular degeneration (HCC)    GERD (gastroesophageal reflux disease)    HOH (hard of hearing)    Hyperlipidemia    Hypertension    Hypothyroidism    IBS (irritable bowel syndrome)    Ischemic cardiomyopathy    Levoscoliosis of lumbar spine    Long term current use of clopidogrel    NSTEMI (non-ST elevated myocardial infarction) (HCC) 10/18/2021   a.) Troponin peaked at  822 ng/L; b.) LHC 10/19/2021: subtotal occlusion of the LCx (2.5 x 22 mm Onyx Frontier DES)   Pneumonia    Pre-diabetes    RBBB    Right bundle branch block (RBBB)    Skin cancer, basal cell 10/20/2016   a.) lip, neck   Status post bilateral cataract extraction 2019   Symptomatic PVCs    Tinnitus of both ears     Surgical History: Past Surgical History:  Procedure Laterality Date   ANTERIOR VITRECTOMY Left 11/28/2018   Procedure: ANTERIOR VITRECTOMY;  Surgeon: Ferol Rogue, MD;  Location: [facility];  Service: Ophthalmology;  Laterality: Left;   CATARACT EXTRACTION W/PHACO Right 10/10/2018   Procedure: CATARACT EXTRACTION PHACO AND INTRAOCULAR LENS PLACEMENT (IOC);  Surgeon: Ferol Rogue, MD;  Location: [facility];  Service: Ophthalmology;  Laterality: Right;  US  00:41 CDE 6.87 Fluid pack lot # [id number]   CATARACT EXTRACTION W/PHACO Left 11/28/2018   Procedure: CATARACT EXTRACTION PHACO AND INTRAOCULAR LENS PLACEMENT (IOC)-LEFT;  Surgeon: Ferol Rogue, MD;  Location: [facility];  Service: Ophthalmology;  Laterality: Left;  US  00:47.7 CDE 8.92 Fluid Pack lot # [id number]   CHOLECYSTECTOMY  1968   CHONDROPLASTY Right 01/02/2018   Procedure: CHONDROPLASTY;  Surgeon: Mardee Lynwood SQUIBB, MD;  Location: [facility];  Service: Orthopedics;  Laterality: Right;   COLONOSCOPY  2014   COLONOSCOPY  N/A 07/13/2024   Procedure: COLONOSCOPY;  Surgeon: Unk Corinn Skiff, MD;  Location: [facility];  Service: Gastroenterology;  Laterality: N/A;   CORONARY STENT INTERVENTION N/A 10/19/2021   Procedure: CORONARY STENT INTERVENTION;  Surgeon: Ammon Blunt, MD;  Location: [facility];  Service: Cardiovascular;  Laterality: N/A;   ESOPHAGOGASTRODUODENOSCOPY N/A 07/13/2024   Procedure: EGD (ESOPHAGOGASTRODUODENOSCOPY);  Surgeon: Unk Corinn Skiff, MD;  Location: [facility];  Service: Gastroenterology;  Laterality: N/A;   HERNIA REPAIR Left 04/18/1996   inguinal hernia repair/ Dr Dessa    HERNIA REPAIR Right 02/26/1996   Dr Dessa   HERNIA REPAIR Left 08/07/2002   Dr Dessa   HERNIORRHAPHY, INGUINAL, ROBOT-ASSISTED, LAPAROSCOPIC Left 07/29/2024   Procedure: HERNIORRHAPHY, INGUINAL, ROBOT-ASSISTED, LAPAROSCOPIC;  Surgeon: Jordis Laneta FALCON, MD;  Location: [facility];  Service: General;  Laterality: Left;   INSERTION OF MESH  07/29/2024   Procedure: INSERTION OF MESH;  Surgeon: Jordis Laneta FALCON, MD;  Location: [facility];  Service: General;;   JOINT REPLACEMENT     KNEE ARTHROPLASTY Right 04/30/2020   Procedure: COMPUTER ASSISTED TOTAL KNEE ARTHROPLASTY;  Surgeon: Mardee Lynwood SQUIBB, MD;  Location: [facility];  Service: Orthopedics;  Laterality: Right;   KNEE ARTHROSCOPY Right 01/02/2018   Procedure: ARTHROSCOPY KNEE;  Surgeon: Mardee Lynwood SQUIBB, MD;  Location: [facility];  Service: Orthopedics;  Laterality: Right;   KNEE ARTHROSCOPY Left 02/14/2012   partial menisectomy and chondroplasty   KNEE ARTHROSCOPY WITH MEDIAL MENISECTOMY Right 01/02/2018   Procedure: KNEE ARTHROSCOPY WITH MEDIAL MENISECTOMY;  Surgeon: Mardee Lynwood SQUIBB, MD;  Location: [facility];  Service: Orthopedics;  Laterality: Right;   LEFT HEART CATH AND CORONARY ANGIOGRAPHY N/A 10/19/2021   Procedure: LEFT HEART CATH AND CORONARY ANGIOGRAPHY;  Surgeon: Ammon Blunt, MD;  Location: [facility];  Service: Cardiovascular;  Laterality: N/A;   LEFT HEART CATH AND CORONARY ANGIOGRAPHY Left 10/10/2022   Procedure: LEFT HEART CATH AND CORONARY ANGIOGRAPHY;  Surgeon: Ammon Blunt, MD;  Location: [facility];  Service: Cardiovascular;  Laterality: Left;   POLYPECTOMY  07/13/2024   Procedure: POLYPECTOMY, INTESTINE;  Surgeon: Unk Corinn Skiff, MD;  Location: [facility] END[facility];  Service: Gastroenterology;;   TONSILLECTOMY     as a child   TOTAL KNEE ARTHROPLASTY Left 03/29/2015   [facility] Dr. Hooten   VASECTOMY      Home Medications:  Allergies as of 08/04/2024       Reactions   Codeine    GI intolerance;  hallucinations    Statins    Muscle pain   Tamsulosin  Cough        Medication List        Accurate as of August 04, 2024  3:42 PM. If you have any questions, ask your nurse or doctor.          amLODipine  5 MG tablet Commonly known as: NORVASC  Take 1 tablet (5 mg total) by mouth daily. Please schedule office visit before any future refill.   ascorbic acid  1000 MG tablet Commonly known as: VITAMIN C Take 1 tablet by mouth daily.   cyanocobalamin  1000 MCG tablet Take 1 tablet (1,000 mcg total) by mouth daily.   furosemide 20 MG tablet Commonly known as: LASIX Take 20 mg by mouth daily.   HYDROcodone -acetaminophen  5-325 MG tablet Commonly known as: NORCO/VICODIN Take 1-2 tablets by mouth every 6 (six) hours as needed for moderate pain (pain score 4-6).   levothyroxine  75 MCG tablet Commonly known as: SYNTHROID  Take 75 mcg by mouth daily  before breakfast.   metoprolol  succinate 25 MG 24 hr tablet Commonly known as: TOPROL -XL TAKE 1/2 TABLET BY MOUTH EVERY EVENING   multivitamin with minerals Tabs tablet Take 1 tablet by mouth every evening.   pantoprazole  40 MG tablet Commonly known as: PROTONIX  Take 1 tablet (40 mg total) by mouth 2 (two) times daily before a meal.   rosuvastatin  40 MG tablet Commonly known as: CRESTOR  TAKE ONE TABLET BY MOUTH EVERY DAY   tamsulosin  0.4 MG Caps capsule Commonly known as: FLOMAX  Take 1 capsule (0.4 mg total) by mouth daily.        Allergies:  Allergies  Allergen Reactions   Codeine     GI intolerance; hallucinations    Statins     Muscle pain   Tamsulosin  Cough    Family History: Family History  Problem Relation Age of Onset   Heart disease Mother        A fib   Healthy Sister     Social History:   reports that he quit smoking about 59 years ago. His smoking use included cigarettes. He has never used smokeless tobacco. He reports current alcohol  use. He reports that he does not use drugs.  Physical  Exam: BP 139/73   Pulse 68   Ht 6' (1.829 m)   Wt 172 lb (78 kg)   BMI 23.33 kg/m   Constitutional:  Alert and oriented, no acute distress, nontoxic appearing HEENT: [place], AT Cardiovascular: No clubbing, cyanosis, or edema Respiratory: Normal respiratory effort, no increased work of breathing Skin: No rashes, bruises or suspicious lesions Neurologic: Grossly intact, no focal deficits, moving all 4 extremities Psychiatric: Normal mood and affect  Laboratory Data: Results for orders placed or performed in visit on 08/04/24  BLADDER SCAN AMB NON-IMAGING   Collection Time: 08/04/24  3:01 PM  Result Value Ref Range   Scan Result    Simple Catheter Placement  Due to urinary retention patient is present today for a foley cath placement.  Patient was cleaned and prepped in a sterile fashion with betadine  and 2% lidocaine  jelly was instilled into the urethra. A 16 FR coude foley catheter was inserted, urine return was noted 700+ml, urine was yellow in color.  The balloon was filled with 10cc of sterile water.  A leg bag was attached for drainage. Patient tolerated well, no complications were noted   Performed by: Zunaira Lamy, PA-C   Assessment & Plan:   1. Acute urinary retention (Primary) Voiding trial failed today.  Foley replaced as above.  I recommended proceeding directly to cystoscopy with Dr. Twylla for further evaluation of his possible bladder mass versus diverticulum and consideration of outlet procedures and they agreed.  Will get this scheduled.  Addendum: Urinalysis resulted after they left and was consistent with recent catheterization.  Vitals were WNL with me in clinic, so I did not pursue antibiotics at the time.  He subsequently developed fevers overnight and has been admitted with UTI.  Agree with antibiotics per admitting team. - BLADDER SCAN AMB NON-IMAGING - Urinalysis, Complete  Return in about 2 weeks (around 08/18/2024) for Cysto with Dr.  Twylla.  Gary Hones, PA-C  [facility] [place] [address] [place], [place] [address] [phone]

## 2024-08-05 ENCOUNTER — Emergency Department

## 2024-08-05 ENCOUNTER — Other Ambulatory Visit: Payer: Self-pay

## 2024-08-05 ENCOUNTER — Inpatient Hospital Stay
Admission: EM | Admit: 2024-08-05 | Discharge: 2024-08-08 | DRG: 698 | Disposition: A | Discharge status: Home Health | Attending: Hospitalist | Admitting: Hospitalist

## 2024-08-05 DIAGNOSIS — N390 Urinary tract infection, site not specified: Secondary | ICD-10-CM | POA: Diagnosis not present

## 2024-08-05 DIAGNOSIS — Z9842 Cataract extraction status, left eye: Secondary | ICD-10-CM

## 2024-08-05 DIAGNOSIS — D649 Anemia, unspecified: Secondary | ICD-10-CM | POA: Diagnosis present

## 2024-08-05 DIAGNOSIS — I255 Ischemic cardiomyopathy: Secondary | ICD-10-CM | POA: Diagnosis present

## 2024-08-05 DIAGNOSIS — Z96653 Presence of artificial knee joint, bilateral: Secondary | ICD-10-CM | POA: Diagnosis present

## 2024-08-05 DIAGNOSIS — I2510 Atherosclerotic heart disease of native coronary artery without angina pectoris: Secondary | ICD-10-CM | POA: Diagnosis present

## 2024-08-05 DIAGNOSIS — Z85828 Personal history of other malignant neoplasm of skin: Secondary | ICD-10-CM

## 2024-08-05 DIAGNOSIS — F419 Anxiety disorder, unspecified: Secondary | ICD-10-CM | POA: Diagnosis present

## 2024-08-05 DIAGNOSIS — Z87440 Personal history of urinary (tract) infections: Secondary | ICD-10-CM

## 2024-08-05 DIAGNOSIS — Z860100 Personal history of colon polyps, unspecified: Secondary | ICD-10-CM

## 2024-08-05 DIAGNOSIS — Z888 Allergy status to other drugs, medicaments and biological substances status: Secondary | ICD-10-CM

## 2024-08-05 DIAGNOSIS — E039 Hypothyroidism, unspecified: Secondary | ICD-10-CM | POA: Diagnosis present

## 2024-08-05 DIAGNOSIS — N182 Chronic kidney disease, stage 2 (mild): Secondary | ICD-10-CM | POA: Diagnosis present

## 2024-08-05 DIAGNOSIS — Z9049 Acquired absence of other specified parts of digestive tract: Secondary | ICD-10-CM

## 2024-08-05 DIAGNOSIS — N400 Enlarged prostate without lower urinary tract symptoms: Secondary | ICD-10-CM | POA: Diagnosis present

## 2024-08-05 DIAGNOSIS — B961 Klebsiella pneumoniae [K. pneumoniae] as the cause of diseases classified elsewhere: Secondary | ICD-10-CM | POA: Diagnosis present

## 2024-08-05 DIAGNOSIS — T83511A Infection and inflammatory reaction due to indwelling urethral catheter, initial encounter: Secondary | ICD-10-CM | POA: Diagnosis not present

## 2024-08-05 DIAGNOSIS — E785 Hyperlipidemia, unspecified: Secondary | ICD-10-CM | POA: Diagnosis present

## 2024-08-05 DIAGNOSIS — I4510 Unspecified right bundle-branch block: Secondary | ICD-10-CM | POA: Diagnosis present

## 2024-08-05 DIAGNOSIS — K91872 Postprocedural seroma of a digestive system organ or structure following a digestive system procedure: Secondary | ICD-10-CM | POA: Diagnosis present

## 2024-08-05 DIAGNOSIS — Z87891 Personal history of nicotine dependence: Secondary | ICD-10-CM

## 2024-08-05 DIAGNOSIS — Z9841 Cataract extraction status, right eye: Secondary | ICD-10-CM

## 2024-08-05 DIAGNOSIS — Z79899 Other long term (current) drug therapy: Secondary | ICD-10-CM

## 2024-08-05 DIAGNOSIS — F32A Depression, unspecified: Secondary | ICD-10-CM | POA: Diagnosis present

## 2024-08-05 DIAGNOSIS — Z885 Allergy status to narcotic agent status: Secondary | ICD-10-CM

## 2024-08-05 DIAGNOSIS — I129 Hypertensive chronic kidney disease with stage 1 through stage 4 chronic kidney disease, or unspecified chronic kidney disease: Secondary | ICD-10-CM | POA: Diagnosis present

## 2024-08-05 DIAGNOSIS — Y846 Urinary catheterization as the cause of abnormal reaction of the patient, or of later complication, without mention of misadventure at the time of the procedure: Secondary | ICD-10-CM | POA: Diagnosis present

## 2024-08-05 DIAGNOSIS — Z79890 Hormone replacement therapy (postmenopausal): Secondary | ICD-10-CM

## 2024-08-05 DIAGNOSIS — Z961 Presence of intraocular lens: Secondary | ICD-10-CM | POA: Diagnosis present

## 2024-08-05 DIAGNOSIS — I252 Old myocardial infarction: Secondary | ICD-10-CM

## 2024-08-05 DIAGNOSIS — A419 Sepsis, unspecified organism: Secondary | ICD-10-CM | POA: Diagnosis present

## 2024-08-05 DIAGNOSIS — Z8249 Family history of ischemic heart disease and other diseases of the circulatory system: Secondary | ICD-10-CM

## 2024-08-05 DIAGNOSIS — Z955 Presence of coronary angioplasty implant and graft: Secondary | ICD-10-CM

## 2024-08-05 DIAGNOSIS — R319 Hematuria, unspecified: Secondary | ICD-10-CM | POA: Diagnosis present

## 2024-08-05 DIAGNOSIS — I480 Paroxysmal atrial fibrillation: Secondary | ICD-10-CM | POA: Diagnosis present

## 2024-08-05 LAB — CBC WITH DIFFERENTIAL/PLATELET
Abs Immature Granulocytes: 0.04 K/uL (ref 0.00–0.07)
Basophils Absolute: 0 K/uL (ref 0.0–0.1)
Basophils Relative: 0 %
Eosinophils Absolute: 0.1 K/uL (ref 0.0–0.5)
Eosinophils Relative: 1 %
HCT: 34.7 % — ABNORMAL LOW (ref 39.0–52.0)
Hemoglobin: 11.2 g/dL — ABNORMAL LOW (ref 13.0–17.0)
Immature Granulocytes: 0 %
Lymphocytes Relative: 8 %
Lymphs Abs: 0.8 K/uL (ref 0.7–4.0)
MCH: 32 pg (ref 26.0–34.0)
MCHC: 32.3 g/dL (ref 30.0–36.0)
MCV: 99.1 fL (ref 80.0–100.0)
Monocytes Absolute: 0.4 K/uL (ref 0.1–1.0)
Monocytes Relative: 4 %
Neutro Abs: 9.4 K/uL — ABNORMAL HIGH (ref 1.7–7.7)
Neutrophils Relative %: 87 %
Platelets: 188 K/uL (ref 150–400)
RBC: 3.5 MIL/uL — ABNORMAL LOW (ref 4.22–5.81)
RDW: 13.7 % (ref 11.5–15.5)
WBC: 10.8 K/uL — ABNORMAL HIGH (ref 4.0–10.5)
nRBC: 0 % (ref 0.0–0.2)

## 2024-08-05 LAB — COMPREHENSIVE METABOLIC PANEL WITH GFR
ALT: 16 U/L (ref 0–44)
AST: 23 U/L (ref 15–41)
Albumin: 3.7 g/dL (ref 3.5–5.0)
Alkaline Phosphatase: 56 U/L (ref 38–126)
Anion gap: 13 (ref 5–15)
BUN: 32 mg/dL — ABNORMAL HIGH (ref 8–23)
CO2: 23 mmol/L (ref 22–32)
Calcium: 9.4 mg/dL (ref 8.9–10.3)
Chloride: 104 mmol/L (ref 98–111)
Creatinine, Ser: 1.37 mg/dL — ABNORMAL HIGH (ref 0.61–1.24)
GFR, Estimated: 51 mL/min — ABNORMAL LOW (ref >60)
Glucose, Bld: 169 mg/dL — ABNORMAL HIGH (ref 70–99)
Potassium: 4.4 mmol/L (ref 3.5–5.1)
Sodium: 140 mmol/L (ref 135–145)
Total Bilirubin: 0.9 mg/dL (ref 0.0–1.2)
Total Protein: 6.5 g/dL (ref 6.5–8.1)

## 2024-08-05 LAB — URINALYSIS, W/ REFLEX TO CULTURE (INFECTION SUSPECTED)
Bilirubin Urine: NEGATIVE
Glucose, UA: NEGATIVE mg/dL
Ketones, ur: NEGATIVE mg/dL
Nitrite: POSITIVE — AB
Protein, ur: 100 mg/dL — AB
RBC / HPF: >50 RBC/hpf (ref 0–5)
Specific Gravity, Urine: 1.016 (ref 1.005–1.030)
WBC, UA: >50 WBC/hpf (ref 0–5)
pH: 6 (ref 5.0–8.0)

## 2024-08-05 LAB — PROTIME-INR
INR: 1 (ref 0.8–1.2)
Prothrombin Time: 14.2 s (ref 11.4–15.2)

## 2024-08-05 LAB — LACTIC ACID, PLASMA
Lactic Acid, Venous: 2.4 mmol/L (ref 0.5–1.9)
Lactic Acid, Venous: 1.1 mmol/L (ref 0.5–1.9)

## 2024-08-05 MED ORDER — ONDANSETRON HCL 4 MG/2ML IJ SOLN: 4.0000 mg | Freq: Four times a day (QID) | INTRAMUSCULAR | Status: DC | PRN

## 2024-08-05 MED ORDER — ACETAMINOPHEN 500 MG PO TABS
1000.0000 mg | ORAL_TABLET | ORAL | Status: AC
  Administered 2024-08-05 (×2): 1000 mg via ORAL
  Filled 2024-08-05: qty 2

## 2024-08-05 MED ORDER — MORPHINE SULFATE (PF) 2 MG/ML IV SOLN
2.0000 mg | Freq: Once | INTRAVENOUS | Status: AC
  Administered 2024-08-05 (×2): 2 mg via INTRAVENOUS
  Filled 2024-08-05: qty 1

## 2024-08-05 MED ORDER — LACTULOSE 10 GM/15ML PO SOLN: 30.0000 g | Freq: Two times a day (BID) | ORAL | Status: DC | PRN

## 2024-08-05 MED ORDER — HYDRALAZINE HCL 20 MG/ML IJ SOLN: 5.0000 mg | Freq: Four times a day (QID) | INTRAMUSCULAR | Status: DC | PRN

## 2024-08-05 MED ORDER — POLYETHYLENE GLYCOL 3350 17 G PO PACK
17.0000 g | PACK | Freq: Two times a day (BID) | ORAL | Status: DC
  Administered 2024-08-05 – 2024-08-07 (×9): 17 g via ORAL
  Filled 2024-08-05 (×7): qty 1

## 2024-08-05 MED ORDER — ONDANSETRON HCL 4 MG/2ML IJ SOLN
4.0000 mg | INTRAMUSCULAR | Status: AC
  Administered 2024-08-05 (×2): 4 mg via INTRAVENOUS
  Filled 2024-08-05: qty 2

## 2024-08-05 MED ORDER — SODIUM CHLORIDE 0.9 % IV BOLUS
1000.0000 mL | Freq: Once | INTRAVENOUS | Status: AC
  Administered 2024-08-05 (×2): 1000 mL via INTRAVENOUS

## 2024-08-05 MED ORDER — SODIUM CHLORIDE 0.9 % IV SOLN
1.0000 g | INTRAVENOUS | Status: DC
  Administered 2024-08-06 – 2024-08-08 (×4): 1 g via INTRAVENOUS
  Filled 2024-08-05 (×3): qty 10

## 2024-08-05 MED ORDER — IOHEXOL 300 MG/ML  SOLN
100.0000 mL | Freq: Once | INTRAMUSCULAR | Status: AC | PRN
  Administered 2024-08-05 (×2): 100 mL via INTRAVENOUS

## 2024-08-05 MED ORDER — ENOXAPARIN SODIUM 40 MG/0.4ML IJ SOSY
40.0000 mg | PREFILLED_SYRINGE | INTRAMUSCULAR | Status: DC
  Administered 2024-08-05 – 2024-08-07 (×5): 40 mg via SUBCUTANEOUS
  Filled 2024-08-05 (×3): qty 0.4

## 2024-08-05 MED ORDER — SODIUM CHLORIDE 0.9 % IV SOLN
2.0000 g | INTRAVENOUS | Status: AC
  Administered 2024-08-05 (×2): 2 g via INTRAVENOUS
  Filled 2024-08-05: qty 20

## 2024-08-05 MED ORDER — ONDANSETRON HCL 4 MG PO TABS: 4.0000 mg | ORAL_TABLET | Freq: Four times a day (QID) | ORAL | Status: DC | PRN

## 2024-08-05 MED ORDER — ROSUVASTATIN CALCIUM 10 MG PO TABS
40.0000 mg | ORAL_TABLET | Freq: Every day | ORAL | Status: DC
  Administered 2024-08-05 – 2024-08-07 (×5): 40 mg via ORAL
  Filled 2024-08-05 (×3): qty 4
  Filled 2024-08-05: qty 2

## 2024-08-05 MED ORDER — PANTOPRAZOLE SODIUM 40 MG PO TBEC
40.0000 mg | DELAYED_RELEASE_TABLET | Freq: Two times a day (BID) | ORAL | Status: DC
  Administered 2024-08-05 – 2024-08-08 (×9): 40 mg via ORAL
  Filled 2024-08-05 (×5): qty 1

## 2024-08-05 MED ORDER — LEVOTHYROXINE SODIUM 75 MCG PO TABS
75.0000 ug | ORAL_TABLET | Freq: Every day | ORAL | Status: DC
  Administered 2024-08-06 – 2024-08-08 (×4): 75 ug via ORAL
  Filled 2024-08-05 (×3): qty 1

## 2024-08-05 MED ORDER — TAMSULOSIN HCL 0.4 MG PO CAPS
0.4000 mg | ORAL_CAPSULE | Freq: Every day | ORAL | Status: DC
  Administered 2024-08-06 – 2024-08-08 (×4): 0.4 mg via ORAL
  Filled 2024-08-05 (×3): qty 1

## 2024-08-05 MED ORDER — HYDROCODONE-ACETAMINOPHEN 5-325 MG PO TABS
1.0000 | ORAL_TABLET | Freq: Four times a day (QID) | ORAL | Status: DC | PRN
  Administered 2024-08-05 (×2): 1 via ORAL
  Filled 2024-08-05: qty 1

## 2024-08-05 NOTE — Plan of Care (Signed)

## 2024-08-05 NOTE — H&P (Signed)
 History and Physical    Gary Mueller:[id number] DOB: April 06, 1941 DOA: 08/05/2024  PCP: Fernande Ophelia [name], MD (Confirm with patient/family/NH records and if not entered, this has to be entered at [facility] point of entry) Patient coming from: Home  I have personally briefly reviewed patient's old medical records in [facility]  Chief Complaint: Fever, abd pain  HPI: Gary Mueller is a 84 y.o. male with medical history significant of recent inguinal hernia repair, chronic urinary retention on Foley, HTN, HLD, PAF not on anticoagulation, CKD stage III, BPH, CAD status post stenting, presented with new onset fever and worsening of abdominal pain.  Patient underwent left inguinal hernia repair 1 week ago and discharged home.  Since then, he has had intermittent left-sided abdominal pain, he attributed to surgical insertion site pain, he takes oxycodone  for pain control and developed severe constipation for which he uses Colace twice daily with some relief.  Patient has a chronic Foley for BPH.  Saturday he started to notice streaks of blood in the Foley and went to see urology yesterday, a voiding trial was not successful and patient had Foley exchanged and sent home.  Overnight patient started to have chills and subjective fever and worsening of left lower abdominal pain and came back to hospital.  Feeling nauseous last 2 days and vomited 1 time of stomach content nonbilious nonbloody.  He still feels nauseous now but no more vomiting.  Denies any chest pain shortness of breath, no cough. ED Course: Temperature 100.1, blood work showed WBC 10.8 hemoglobin 11.2 BUN 32 creatinine 1.3 bicarb 23K 4.4 lactic acid 2.4> 1.6 after 1 L IV bolus.  And patient was given broad-spectrum antibiotics.  UA showed 3+ WBC and 3+ RBC.  Review of Systems: As per HPI otherwise 14 point review of systems negative.    Past Medical History:  Diagnosis Date   Acute gastric ulcer with hemorrhage    Allergy    Anemia     Anxiety    Aortic atherosclerosis (HCC)    Arthritis    Atrial fibrillation (HCC)    a.) CHA2DS2VASc = 5 (age x 2, CHF, HTN, prior MI/vascular disease) as of 07/25/2024; b.) rate/rhythm maintained on oral metoprolol  succinate; no OAC (does take clopidogrel)   BILATERAL recurrent inguinal hernia    a.) s/p repair RIGHT 02/1996; b.) s/p repair LEFT 03/1996; c.) s/p repair recurrent LEFT 07/2002; d.) planned repair of second recurrence on LEFT 07/2024   BPH (benign prostatic hyperplasia)    CHF (congestive heart failure) (HCC)    CKD stage 3a, GFR 45-59 ml/min (HCC)    Colon polyps    Coronary artery disease    a.) s/p NSTEMI 10/18/2021 with PCI of LCx 10/19/2021 (2.5 x 22 mm Onyx Frontier DES)   Cystoid macular edema of left eye 07/07/2019   Depression    Diverticulosis    DOE (dyspnea on exertion)    Exudative age related macular degeneration (HCC)    GERD (gastroesophageal reflux disease)    HOH (hard of hearing)    Hyperlipidemia    Hypertension    Hypothyroidism    IBS (irritable bowel syndrome)    Ischemic cardiomyopathy    Levoscoliosis of lumbar spine    Long term current use of clopidogrel    NSTEMI (non-ST elevated myocardial infarction) (HCC) 10/18/2021   a.) Troponin peaked at 822 ng/L; b.) LHC 10/19/2021: subtotal occlusion of the LCx (2.5 x 22 mm Onyx Frontier DES)   Pneumonia  Pre-diabetes    RBBB    Right bundle branch block (RBBB)    Skin cancer, basal cell 10/20/2016   a.) lip, neck   Status post bilateral cataract extraction 2019   Symptomatic PVCs    Tinnitus of both ears     Past Surgical History:  Procedure Laterality Date   ANTERIOR VITRECTOMY Left 11/28/2018   Procedure: ANTERIOR VITRECTOMY;  Surgeon: Ferol Rogue, MD;  Location: [facility] ORS;  Service: Ophthalmology;  Laterality: Left;   CATARACT EXTRACTION W/PHACO Right 10/10/2018   Procedure: CATARACT EXTRACTION PHACO AND INTRAOCULAR LENS PLACEMENT (IOC);  Surgeon: Ferol Rogue, MD;  Location:  [facility];  Service: Ophthalmology;  Laterality: Right;  US  00:41 CDE 6.87 Fluid pack lot # [id number]   CATARACT EXTRACTION W/PHACO Left 11/28/2018   Procedure: CATARACT EXTRACTION PHACO AND INTRAOCULAR LENS PLACEMENT (IOC)-LEFT;  Surgeon: Ferol Rogue, MD;  Location: [facility];  Service: Ophthalmology;  Laterality: Left;  US  00:47.7 CDE 8.92 Fluid Pack lot # [id number]   CHOLECYSTECTOMY  1968   CHONDROPLASTY Right 01/02/2018   Procedure: CHONDROPLASTY;  Surgeon: Mardee Lynwood SQUIBB, MD;  Location: [facility];  Service: Orthopedics;  Laterality: Right;   COLONOSCOPY  2014   COLONOSCOPY N/A 07/13/2024   Procedure: COLONOSCOPY;  Surgeon: Unk Corinn Skiff, MD;  Location: [facility] ENDOSCOPY;  Service: Gastroenterology;  Laterality: N/A;   CORONARY STENT INTERVENTION N/A 10/19/2021   Procedure: CORONARY STENT INTERVENTION;  Surgeon: Ammon Blunt, MD;  Location: [facility];  Service: Cardiovascular;  Laterality: N/A;   ESOPHAGOGASTRODUODENOSCOPY N/A 07/13/2024   Procedure: EGD (ESOPHAGOGASTRODUODENOSCOPY);  Surgeon: Unk Corinn Skiff, MD;  Location: [facility] ENDOSCOPY;  Service: Gastroenterology;  Laterality: N/A;   HERNIA REPAIR Left 04/18/1996   inguinal hernia repair/ Dr Dessa   HERNIA REPAIR Right 02/26/1996   Dr Dessa   HERNIA REPAIR Left 08/07/2002   Dr Dessa   HERNIORRHAPHY, INGUINAL, ROBOT-ASSISTED, LAPAROSCOPIC Left 07/29/2024   Procedure: HERNIORRHAPHY, INGUINAL, ROBOT-ASSISTED, LAPAROSCOPIC;  Surgeon: Jordis Laneta FALCON, MD;  Location: [facility];  Service: General;  Laterality: Left;   INSERTION OF MESH  07/29/2024   Procedure: INSERTION OF MESH;  Surgeon: Jordis Laneta FALCON, MD;  Location: [facility] OR[facility];  Service: General;;   JOINT REPLACEMENT     KNEE ARTHROPLASTY Right 04/30/2020   Procedure: COMPUTER ASSISTED TOTAL KNEE ARTHROPLASTY;  Surgeon: Mardee Lynwood SQUIBB, MD;  Location: [facility];  Service: Orthopedics;  Laterality: Right;   KNEE ARTHROSCOPY Right 01/02/2018   Procedure:  ARTHROSCOPY KNEE;  Surgeon: Mardee Lynwood SQUIBB, MD;  Location: [facility];  Service: Orthopedics;  Laterality: Right;   KNEE ARTHROSCOPY Left 02/14/2012   partial menisectomy and chondroplasty   KNEE ARTHROSCOPY WITH MEDIAL MENISECTOMY Right 01/02/2018   Procedure: KNEE ARTHROSCOPY WITH MEDIAL MENISECTOMY;  Surgeon: Mardee Lynwood SQUIBB, MD;  Location: [facility];  Service: Orthopedics;  Laterality: Right;   LEFT HEART CATH AND CORONARY ANGIOGRAPHY N/A 10/19/2021   Procedure: LEFT HEART CATH AND CORONARY ANGIOGRAPHY;  Surgeon: Ammon Blunt, MD;  Location: [facility];  Service: Cardiovascular;  Laterality: N/A;   LEFT HEART CATH AND CORONARY ANGIOGRAPHY Left 10/10/2022   Procedure: LEFT HEART CATH AND CORONARY ANGIOGRAPHY;  Surgeon: Ammon Blunt, MD;  Location: [facility];  Service: Cardiovascular;  Laterality: Left;   POLYPECTOMY  07/13/2024   Procedure: POLYPECTOMY, INTESTINE;  Surgeon: Unk Corinn Skiff, MD;  Location: [facility] ENDOSCOPY;  Service: Gastroenterology;;   TONSILLECTOMY     as a child   TOTAL KNEE ARTHROPLASTY Left 03/29/2015   [facility] Dr.  Hooten   VASECTOMY       reports that he quit smoking about 59 years ago. His smoking use included cigarettes. He has never used smokeless tobacco. He reports current alcohol  use. He reports that he does not use drugs.  Allergies  Allergen Reactions   Codeine     GI intolerance; hallucinations    Statins     Muscle pain   Tamsulosin  Cough    Family History  Problem Relation Age of Onset   Heart disease Mother        A fib   Healthy Sister      Prior to Admission medications   Medication Sig Start Date End Date Taking? Authorizing Provider  potassium chloride (KLOR-CON) 10 MEQ tablet TAKE ONE TABLET BY MOUTH ONCE DAILY AS NEEDED WITH FUROSEMIDE 07/30/24  Yes [provider]  amLODipine  (NORVASC ) 5 MG tablet Take 1 tablet (5 mg total) by mouth daily. Please schedule office visit before any future  refill. 04/16/23   Simmons-Robinson, Rockie, MD  ascorbic acid  (VITAMIN C) 1000 MG tablet Take 1 tablet by mouth daily.    [provider]  cyanocobalamin  1000 MCG tablet Take 1 tablet (1,000 mcg total) by mouth daily. 07/14/24   Patel, Sona, MD  furosemide (LASIX) 20 MG tablet Take 20 mg by mouth daily. 04/22/24   [provider]  HYDROcodone -acetaminophen  (NORCO/VICODIN) 5-325 MG tablet Take 1-2 tablets by mouth every 6 (six) hours as needed for moderate pain (pain score 4-6). 07/29/24   Pabon, Diego F, MD  levothyroxine  (SYNTHROID ) 75 MCG tablet Take 75 mcg by mouth daily before breakfast.    [provider]  metoprolol  succinate (TOPROL -XL) 25 MG 24 hr tablet TAKE 1/2 TABLET BY MOUTH EVERY EVENING 04/23/23   Simmons-Robinson, Makiera, MD  Multiple Vitamin (MULTIVITAMIN WITH MINERALS) TABS tablet Take 1 tablet by mouth every evening.     [provider]  pantoprazole  (PROTONIX ) 40 MG tablet Take 1 tablet (40 mg total) by mouth 2 (two) times daily before a meal. 07/13/24   Tobie Calix, MD  rosuvastatin  (CRESTOR ) 40 MG tablet TAKE ONE TABLET BY MOUTH EVERY DAY 02/06/22   Bertrum Charlie CROME, MD  tamsulosin  (FLOMAX ) 0.4 MG CAPS capsule Take 1 capsule (0.4 mg total) by mouth daily. 07/18/24   Jhonny Calvin NOVAK, MD    Physical Exam: Vitals:   08/05/24 0900 08/05/24 0930 08/05/24 0952 08/05/24 1101  BP: (!) 116/59 (!) 129/54    Pulse: 83 79    Resp: (!) 24 (!) 21    Temp:   98.7 F (37.1 C) 100.1 F (37.8 C)  TempSrc:    Oral  SpO2: 95% 99%    Weight:      Height:        Constitutional: NAD, calm, comfortable Vitals:   08/05/24 0900 08/05/24 0930 08/05/24 0952 08/05/24 1101  BP: (!) 116/59 (!) 129/54    Pulse: 83 79    Resp: (!) 24 (!) 21    Temp:   98.7 F (37.1 C) 100.1 F (37.8 C)  TempSrc:    Oral  SpO2: 95% 99%    Weight:      Height:       Eyes: PERRL, lids and conjunctivae normal ENMT: Mucous membranes are moist. Posterior pharynx clear of  any exudate or lesions.Normal dentition.  Neck: normal, supple, no masses, no thyromegaly Respiratory: clear to auscultation bilaterally, no wheezing, no crackles. Normal respiratory effort. No accessory muscle use.  Cardiovascular: Regular rate and rhythm,  no murmurs / rubs / gallops. No extremity edema. 2+ pedal pulses. No carotid bruits.  Abdomen: mild tenderness on left groin surgical site but no significant swelling or drainage or bleeding, no masses palpated. No hepatosplenomegaly. Bowel sounds positive.  Musculoskeletal: no clubbing / cyanosis. No joint deformity upper and lower extremities. Good ROM, no contractures. Normal muscle tone.  Skin: no rashes, lesions, ulcers. No induration Neurologic: CN 2-12 grossly intact. Sensation intact, DTR normal. Strength 5/5 in all 4.  Psychiatric: Normal judgment and insight. Alert and oriented x 3. Normal mood.    Labs on Admission: I have personally reviewed following labs and imaging studies  CBC: Recent Labs  Lab 08/05/24 0826  WBC 10.8*  NEUTROABS 9.4*  HGB 11.2*  HCT 34.7*  MCV 99.1  PLT 188   Basic Metabolic Panel: Recent Labs  Lab 08/05/24 0826  NA 140  K 4.4  CL 104  CO2 23  GLUCOSE 169*  BUN 32*  CREATININE 1.37*  CALCIUM  9.4   GFR: Estimated Creatinine Clearance: 43.8 mL/min (A) (by C-G formula based on SCr of 1.37 mg/dL (H)). Liver Function Tests: Recent Labs  Lab 08/05/24 0826  AST 23  ALT 16  ALKPHOS 56  BILITOT 0.9  PROT 6.5  ALBUMIN 3.7   No results for input(s): LIPASE, AMYLASE in the last 168 hours. No results for input(s): AMMONIA in the last 168 hours. Coagulation Profile: Recent Labs  Lab 08/05/24 0826  INR 1.0   Cardiac Enzymes: No results for input(s): CKTOTAL, CKMB, CKMBINDEX, TROPONINI in the last 168 hours. BNP (last 3 results) No results for input(s): PROBNP in the last 8760 hours. HbA1C: No results for input(s): HGBA1C in the last 72 hours. CBG: No results for  input(s): GLUCAP in the last 168 hours. Lipid Profile: No results for input(s): CHOL, HDL, LDLCALC, TRIG, CHOLHDL, LDLDIRECT in the last 72 hours. Thyroid  Function Tests: No results for input(s): TSH, T4TOTAL, FREET4, T3FREE, THYROIDAB in the last 72 hours. Anemia Panel: No results for input(s): VITAMINB12, FOLATE, FERRITIN, TIBC, IRON , RETICCTPCT in the last 72 hours. Urine analysis:    Component Value Date/Time   COLORURINE YELLOW (A) 08/05/2024 0826   APPEARANCEUR HAZY (A) 08/05/2024 0826   APPEARANCEUR Cloudy (A) 08/04/2024 1501   LABSPEC 1.016 08/05/2024 0826   LABSPEC 1.016 03/17/2015 0928   PHURINE 6.0 08/05/2024 0826   GLUCOSEU NEGATIVE 08/05/2024 0826   GLUCOSEU NEGATIVE 03/17/2015 0928   HGBUR MODERATE (A) 08/05/2024 0826   BILIRUBINUR NEGATIVE 08/05/2024 0826   BILIRUBINUR Negative 08/04/2024 1501   BILIRUBINUR NEGATIVE 03/17/2015 0928   KETONESUR NEGATIVE 08/05/2024 0826   PROTEINUR 100 (A) 08/05/2024 0826   NITRITE POSITIVE (A) 08/05/2024 0826   LEUKOCYTESUR LARGE (A) 08/05/2024 0826   LEUKOCYTESUR NEGATIVE 03/17/2015 0928    Radiological Exams on Admission: CT ABDOMEN PELVIS W CONTRAST Result Date: 08/05/2024 CLINICAL DATA:  Left lower quadrant abdominal pain. Status post surgical hernia repair. EXAM: CT ABDOMEN AND PELVIS WITH CONTRAST TECHNIQUE: Multidetector CT imaging of the abdomen and pelvis was performed using the standard protocol following bolus administration of intravenous contrast. RADIATION DOSE REDUCTION: This exam was performed according to the departmental dose-optimization program which includes automated exposure control, adjustment of the mA and/or kV according to patient size and/or use of iterative reconstruction technique. CONTRAST:  OMNIPAQUE  IOHEXOL  300 MG/ML  SOLN COMPARISON:  July 15, 2024. FINDINGS: Lower chest: Minimal right basilar subsegmental atelectasis. Hepatobiliary: No focal liver abnormality is  seen. Status post cholecystectomy. No biliary dilatation. Pancreas:  Unremarkable. No pancreatic ductal dilatation or surrounding inflammatory changes. Spleen: Normal in size without focal abnormality. Adrenals/Urinary Tract: Adrenal glands appear normal. Bilateral renal cysts are noted. No hydronephrosis or renal obstruction is noted. Urinary bladder is decompressed secondary to Foley catheter. Stomach/Bowel: Stomach is within normal limits. Appendix appears normal. No evidence of bowel wall thickening, distention, or inflammatory changes. Vascular/Lymphatic: Aortic atherosclerosis. No enlarged abdominal or pelvic lymph nodes. Reproductive: Stable moderate prostatic enlargement. Other: No ascites. 6.7 x 2.9 cm fluid collection is noted in left inguinal canal most consistent with postoperative seroma. Small fat containing supraumbilical ventral hernia is noted adjacent to postoperative change from prior ventral hernia repair. Musculoskeletal: No acute or significant osseous findings. IMPRESSION: 6.7 x 2.9 cm fluid collection is noted in left inguinal canal most consistent with postoperative seroma. Small fat containing supraumbilical ventral hernia is noted. Stable moderate prostatic enlargement. Aortic Atherosclerosis (ICD10-I70.0). Electronically Signed   By: Lynwood Landy Raddle M.D.   On: 08/05/2024 11:15   DG Chest Port 1 View Result Date: 08/05/2024 CLINICAL DATA:  Sepsis, fever. EXAM: PORTABLE CHEST 1 VIEW COMPARISON:  July 11, 2024. FINDINGS: The heart size and mediastinal contours are within normal limits. Both lungs are clear. Stable elevated right hemidiaphragm. The visualized skeletal structures are unremarkable. IMPRESSION: No active disease. Electronically Signed   By: Lynwood Landy Raddle M.D.   On: 08/05/2024 09:20    EKG: None  Assessment/Plan Principal Problem:   UTI (urinary tract infection) Active Problems:   Sepsis secondary to UTI (HCC)  (please populate well all problems here in Problem  List. (For example, if patient is on BP meds at home and you resume or decide to hold them, it is a problem that needs to be her. Same for CAD, COPD, HLD and so on)  SIRS - Likely secondary to completed UTI - Continue IV resuscitation - Continue ceftriaxone  - Other DDx, no symptoms signs of pneumonia, surgical site appears to be within normal limits, underneath fluid collection on CT scan appears to be seroma.  General surgeon will evaluate at bedside.  Recent left inguinal hernia repair - Still have some postsurgical pain and tenderness, image study showed fluid collection likely seroma, general surgeon will see the patient in consult.  CKD stage II - Creatinine has been stable, on IV fluid  Urinary retention on chronic indwelling Foley catheter - Foley exchange was done yesterday - On ceftriaxone  for UTI. - Outpatient urology follow-up for void trial and urodynamic study, clinically suspect BPH  HTN - BP borderline low, monitor off home BP meds - As needed hydralazine   PAF - In sinus rhythm - On aspirin , not on anticoagulation  Hypothyroidism - Continue Synthroid   DVT prophylaxis: Lovenox  Code Status: Full code Family Communication: Daughter at bedside Disposition Plan: Expect less than 2 midnight hospital stay Consults called: General Surgery Admission status: Telemetry observation   Cort ONEIDA Mana MD [facility] Pager [phone]  08/05/2024, 1:13 PM

## 2024-08-05 NOTE — ED Triage Notes (Signed)
 First nurse note: Pt to ED via POV from home. Pt with recent hernia surgery and now reporting fever.

## 2024-08-05 NOTE — Consult Note (Signed)
 Patient ID: Gary Mueller, male   DOB: 09/05/1941, 83 y.o.   MRN: [id number]  HPI Gary Mueller is a 83 y.o. male seen in consultation at the request of Dr. Dicky.  He did have an inguinal hernia repair by me last week and he did have evidence of incarceration of bladder.  He did have chronic indwelling catheter that was added to keep perioperatively.  Today saw urology and unfortunately he failed voiding trial.  The catheter had to be reinserted.  He did have fevers and chills last night.  Some abdominal pain related to recent surgery.  He did have nausea.  He is UA clearly shows no urinary tract infections.  He did have a CT scan that I personally reviewed showing appropriate postsurgical changes within the inguinal area.  No evidence of bowel injuries  HPI  Past Medical History:  Diagnosis Date   Acute gastric ulcer with hemorrhage    Allergy    Anemia    Anxiety    Aortic atherosclerosis (HCC)    Arthritis    Atrial fibrillation (HCC)    a.) CHA2DS2VASc = 5 (age x 2, CHF, HTN, prior MI/vascular disease) as of 07/25/2024; b.) rate/rhythm maintained on oral metoprolol  succinate; no OAC (does take clopidogrel)   BILATERAL recurrent inguinal hernia    a.) s/p repair RIGHT 02/1996; b.) s/p repair LEFT 03/1996; c.) s/p repair recurrent LEFT 07/2002; d.) planned repair of second recurrence on LEFT 07/2024   BPH (benign prostatic hyperplasia)    CHF (congestive heart failure) (HCC)    CKD stage 3a, GFR 45-59 ml/min (HCC)    Colon polyps    Coronary artery disease    a.) s/p NSTEMI 10/18/2021 with PCI of LCx 10/19/2021 (2.5 x 22 mm Onyx Frontier DES)   Cystoid macular edema of left eye 07/07/2019   Depression    Diverticulosis    DOE (dyspnea on exertion)    Exudative age related macular degeneration (HCC)    GERD (gastroesophageal reflux disease)    HOH (hard of hearing)    Hyperlipidemia    Hypertension    Hypothyroidism    IBS (irritable bowel syndrome)    Ischemic cardiomyopathy     Levoscoliosis of lumbar spine    Long term current use of clopidogrel    NSTEMI (non-ST elevated myocardial infarction) (HCC) 10/18/2021   a.) Troponin peaked at 822 ng/L; b.) LHC 10/19/2021: subtotal occlusion of the LCx (2.5 x 22 mm Onyx Frontier DES)   Pneumonia    Pre-diabetes    RBBB    Right bundle branch block (RBBB)    Skin cancer, basal cell 10/20/2016   a.) lip, neck   Status post bilateral cataract extraction 2019   Symptomatic PVCs    Tinnitus of both ears     Past Surgical History:  Procedure Laterality Date   ANTERIOR VITRECTOMY Left 11/28/2018   Procedure: ANTERIOR VITRECTOMY;  Surgeon: Ferol Rogue, MD;  Location: [facility] ORS;  Service: Ophthalmology;  Laterality: Left;   CATARACT EXTRACTION W/PHACO Right 10/10/2018   Procedure: CATARACT EXTRACTION PHACO AND INTRAOCULAR LENS PLACEMENT (IOC);  Surgeon: Ferol Rogue, MD;  Location: [facility];  Service: Ophthalmology;  Laterality: Right;  US  00:41 CDE 6.87 Fluid pack lot # [id number]   CATARACT EXTRACTION W/PHACO Left 11/28/2018   Procedure: CATARACT EXTRACTION PHACO AND INTRAOCULAR LENS PLACEMENT (IOC)-LEFT;  Surgeon: Ferol Rogue, MD;  Location: [facility];  Service: Ophthalmology;  Laterality: Left;  US  00:47.7 CDE 8.92 Fluid Pack lot # [id number]  CHOLECYSTECTOMY  1968   CHONDROPLASTY Right 01/02/2018   Procedure: CHONDROPLASTY;  Surgeon: Mardee Lynwood SQUIBB, MD;  Location: [facility];  Service: Orthopedics;  Laterality: Right;   COLONOSCOPY  2014   COLONOSCOPY N/A 07/13/2024   Procedure: COLONOSCOPY;  Surgeon: Unk Corinn Skiff, MD;  Location: [facility];  Service: Gastroenterology;  Laterality: N/A;   CORONARY STENT INTERVENTION N/A 10/19/2021   Procedure: CORONARY STENT INTERVENTION;  Surgeon: Ammon Blunt, MD;  Location: [facility];  Service: Cardiovascular;  Laterality: N/A;   ESOPHAGOGASTRODUODENOSCOPY N/A 07/13/2024   Procedure: EGD (ESOPHAGOGASTRODUODENOSCOPY);  Surgeon: Unk Corinn Skiff,  MD;  Location: [facility] ENDOSCOPY;  Service: Gastroenterology;  Laterality: N/A;   HERNIA REPAIR Left 04/18/1996   inguinal hernia repair/ Dr Dessa   HERNIA REPAIR Right 02/26/1996   Dr Dessa   HERNIA REPAIR Left 08/07/2002   Dr Dessa   HERNIORRHAPHY, INGUINAL, ROBOT-ASSISTED, LAPAROSCOPIC Left 07/29/2024   Procedure: HERNIORRHAPHY, INGUINAL, ROBOT-ASSISTED, LAPAROSCOPIC;  Surgeon: Jordis Laneta FALCON, MD;  Location: [facility];  Service: General;  Laterality: Left;   INSERTION OF MESH  07/29/2024   Procedure: INSERTION OF MESH;  Surgeon: Jordis Laneta FALCON, MD;  Location: [facility];  Service: General;;   JOINT REPLACEMENT     KNEE ARTHROPLASTY Right 04/30/2020   Procedure: COMPUTER ASSISTED TOTAL KNEE ARTHROPLASTY;  Surgeon: Mardee Lynwood SQUIBB, MD;  Location: [facility];  Service: Orthopedics;  Laterality: Right;   KNEE ARTHROSCOPY Right 01/02/2018   Procedure: ARTHROSCOPY KNEE;  Surgeon: Mardee Lynwood SQUIBB, MD;  Location: [facility];  Service: Orthopedics;  Laterality: Right;   KNEE ARTHROSCOPY Left 02/14/2012   partial menisectomy and chondroplasty   KNEE ARTHROSCOPY WITH MEDIAL MENISECTOMY Right 01/02/2018   Procedure: KNEE ARTHROSCOPY WITH MEDIAL MENISECTOMY;  Surgeon: Mardee Lynwood SQUIBB, MD;  Location: [facility];  Service: Orthopedics;  Laterality: Right;   LEFT HEART CATH AND CORONARY ANGIOGRAPHY N/A 10/19/2021   Procedure: LEFT HEART CATH AND CORONARY ANGIOGRAPHY;  Surgeon: Ammon Blunt, MD;  Location: [facility];  Service: Cardiovascular;  Laterality: N/A;   LEFT HEART CATH AND CORONARY ANGIOGRAPHY Left 10/10/2022   Procedure: LEFT HEART CATH AND CORONARY ANGIOGRAPHY;  Surgeon: Ammon Blunt, MD;  Location: [facility];  Service: Cardiovascular;  Laterality: Left;   POLYPECTOMY  07/13/2024   Procedure: POLYPECTOMY, INTESTINE;  Surgeon: Unk Corinn Skiff, MD;  Location: [facility] ENDOSCOPY;  Service: Gastroenterology;;   TONSILLECTOMY     as a child   TOTAL KNEE ARTHROPLASTY  Left 03/29/2015   [facility] Dr. Mardee   VASECTOMY      Family History  Problem Relation Age of Onset   Heart disease Mother        A fib   Healthy Sister     Social History Social History   Tobacco Use   Smoking status: Former    Current packs/day: 0.00    Types: Cigarettes    Quit date: 12/24/1964    Years since quitting: 59.6   Smokeless tobacco: Never  Vaping Use   Vaping status: Never Used  Substance Use Topics   Alcohol  use: Yes    Comment: 0-1 beer or glass of wine monthly   Drug use: No    Allergies  Allergen Reactions   Codeine     GI intolerance; hallucinations    Statins     Muscle pain   Tamsulosin  Cough    Current Facility-Administered Medications  Medication Dose Route Frequency Provider Last Rate Last Admin   [START ON 08/06/2024] cefTRIAXone  (ROCEPHIN ) 1  g in sodium chloride  0.9 % 100 mL IVPB  1 g Intravenous Q24H Laurita Manor T, MD       enoxaparin  (LOVENOX ) injection 40 mg  40 mg Subcutaneous Q24H Laurita Manor T, MD       hydrALAZINE  (APRESOLINE ) injection 5 mg  5 mg Intravenous Q6H PRN Laurita Manor DASEN, MD       HYDROcodone -acetaminophen  (NORCO/VICODIN) 5-325 MG per tablet 1-2 tablet  1-2 tablet Oral Q6H PRN Laurita Manor DASEN, MD       lactulose  (CHRONULAC ) 10 GM/15ML solution 30 g  30 g Oral BID PRN Laurita Manor DASEN, MD       [START ON 08/06/2024] levothyroxine  (SYNTHROID ) tablet 75 mcg  75 mcg Oral QAC breakfast Laurita Manor T, MD       ondansetron  (ZOFRAN ) tablet 4 mg  4 mg Oral Q6H PRN Laurita Manor DASEN, MD       Or   ondansetron  (ZOFRAN ) injection 4 mg  4 mg Intravenous Q6H PRN Laurita Manor DASEN, MD       pantoprazole  (PROTONIX ) EC tablet 40 mg  40 mg Oral BID AC Zhang, Ping T, MD       polyethylene glycol (MIRALAX  / GLYCOLAX ) packet 17 g  17 g Oral BID Laurita Manor T, MD       rosuvastatin  (CRESTOR ) tablet 40 mg  40 mg Oral Daily Laurita Manor T, MD       sodium chloride  0.9 % bolus 1,000 mL  1,000 mL Intravenous Once Laurita Manor DASEN, MD       [START ON 08/06/2024]  tamsulosin  (FLOMAX ) capsule 0.4 mg  0.4 mg Oral Daily Laurita Manor DASEN, MD       Current Outpatient Medications  Medication Sig Dispense Refill   amLODipine  (NORVASC ) 5 MG tablet Take 1 tablet (5 mg total) by mouth daily. Please schedule office visit before any future refill. 90 tablet 0   ascorbic acid  (VITAMIN C) 1000 MG tablet Take 1 tablet by mouth daily.     cyanocobalamin  1000 MCG tablet Take 1 tablet (1,000 mcg total) by mouth daily. 90 tablet 3   HYDROcodone -acetaminophen  (NORCO/VICODIN) 5-325 MG tablet Take 1-2 tablets by mouth every 6 (six) hours as needed for moderate pain (pain score 4-6). 20 tablet 0   levothyroxine  (SYNTHROID ) 75 MCG tablet Take 75 mcg by mouth daily before breakfast.     metoprolol  succinate (TOPROL -XL) 25 MG 24 hr tablet TAKE 1/2 TABLET BY MOUTH EVERY EVENING 45 tablet 0   Multiple Vitamin (MULTIVITAMIN WITH MINERALS) TABS tablet Take 1 tablet by mouth every evening.      pantoprazole  (PROTONIX ) 40 MG tablet Take 1 tablet (40 mg total) by mouth 2 (two) times daily before a meal. 60 tablet 3   potassium chloride (KLOR-CON) 10 MEQ tablet TAKE ONE TABLET BY MOUTH ONCE DAILY AS NEEDED WITH FUROSEMIDE     rosuvastatin  (CRESTOR ) 40 MG tablet TAKE ONE TABLET BY MOUTH EVERY DAY 90 tablet 3   tamsulosin  (FLOMAX ) 0.4 MG CAPS capsule Take 1 capsule (0.4 mg total) by mouth daily. 30 capsule 0   furosemide (LASIX) 20 MG tablet Take 20 mg by mouth daily. (Patient not taking: Reported on 08/05/2024)       Review of Systems Full ROS  was asked and was negative except for the information on the HPI  Physical Exam Blood pressure (!) 112/47, pulse 71, temperature 98.9 F (37.2 C), temperature source Oral, resp. rate 18, height 6' (1.829 m), weight 77.1 kg, SpO2 95%.  CONSTITUTIONAL: NAD. EYES: Pupils are equal, round, Sclera are non-icteric. EARS, NOSE, MOUTH AND THROAT: The oropharynx is clear. The oral mucosa is pink and moist. Hearing is intact to voice. LYMPH NODES:  Lymph  nodes in the neck are normal. RESPIRATORY:  Lungs are clear. There is normal respiratory effort, with equal breath sounds bilaterally, and without pathologic use of accessory muscles. CARDIOVASCULAR: Heart is regular without murmurs, gallops, or rubs. GI: The abdomen is  soft, incisions healing well without evidence of infection no peritonitis or rebound there are no palpable masses. There is no hepatosplenomegaly. There are normal bowel sounds in all quadrants.  Appropriate left inguinal induration GU: Rectal deferred.   MUSCULOSKELETAL: Normal muscle strength and tone. No cyanosis or edema.   SKIN: Turgor is good and there are no pathologic skin lesions or ulcers. NEUROLOGIC: Motor and sensation is grossly normal. Cranial nerves are grossly intact. PSYCH:  Oriented to person, place and time. Affect is normal.  Data Reviewed I have personally reviewed the patient's imaging, laboratory findings and medical records.    Assessment/Plan [name] is a very pleasant 83 year old male with recurrent UTIs and urological tract manipulation recently failed voiding trial.  He did have a recent inguinal hernia by me and on physical exam on CT scan does not seem to be having any complicating features.  No evidence of bowel injury no evidence of abscess.  At this time we will be on standby.  Discussed with medical team regarding his disease process.  Needs to be worked up and probably UTI needs to be treated.  I will be happy to follow him as an outpatient.  No need for urgent surgical invention at this time I personally spent a total of 55 minutes in the care of the patient today including performing a medically appropriate exam/evaluation, counseling and educating, placing orders, referring and communicating with other health care professionals, documenting clinical information in the EHR, independently interpreting and reviewing images studies and coordinating care.    Laneta Luna, MD FACS General  Surgeon 08/05/2024, 2:14 PM

## 2024-08-05 NOTE — ED Provider Notes (Signed)
 [facility] Provider Note    Event Date/Time   First MD Initiated Contact with Patient 08/05/24 [phone]     (approximate)   History   Post-op Problem   HPI  Gary Mueller is a 83 y.o. male   recent history of inability to void postoperative, also Foley catheter placement, and treatment for incarcerated left inguinal hernia  Patient reports doing well until last night he started to develop chills and then a sense of increasing pain having to take his postoperative pain prescription last night.  Today awakening he reports vomited once and having increasing pain in his left lower abdomen and above his pelvis.  He is urinating he is emptying his Foley catheter bag couple times yesterday and does not feel a sense to void.  He reports a moderate pain over the left lower abdomen feeling chills and vomited once nonbloody.      Physical Exam   Triage Vital Signs: ED Triage Vitals  Encounter Vitals Group     BP 08/05/24 0820 (!) 116/94     Girls Systolic BP Percentile --      Girls Diastolic BP Percentile --      Boys Systolic BP Percentile --      Boys Diastolic BP Percentile --      Pulse Rate 08/05/24 0820 91     Resp 08/05/24 0820 (!) 24     Temp 08/05/24 0820 99.1 F (37.3 C)     Temp src --      SpO2 08/05/24 0820 98 %     Weight 08/05/24 0825 170 lb (77.1 kg)     Height 08/05/24 0825 6' (1.829 m)     Head Circumference --      Peak Flow --      Pain Score 08/05/24 0822 7     Pain Loc --      Pain Education --      Exclude from Growth Chart --     Most recent vital signs: Vitals:   08/05/24 0952 08/05/24 1101  BP:    Pulse:    Resp:    Temp: 98.7 F (37.1 C) 100.1 F (37.8 C)  SpO2:       General: Awake, no distress.  Pleasant appears in some pain, after discussion agreeable to starting with a low-dose of morphine  CV:  Good peripheral perfusion.  Normal tones Resp:  Normal effort.  Clear bilateral.  Denies any cough or sputum  production or shortness of breath Abd:  No distention.  Tender primarily suprapubic and left lower quadrant.  Surgical sites clean dry intact without surrounding erythema.  He is tender however and there is slight firmness in the left lower quadrant/inguinal region though he reports this tenderness to be more in the superior region overlying the left lower quadrant and in the inguinal fold of the left.  Scrotum and testicles appear normal.  Foley catheter emerging from the urethra which appears normal without surrounding blood.  Draining clear urine patient reports last emptied Foley catheter prior to come to the ER catheter bag currently about half full Other:     ED Results / Procedures / Treatments   Labs (all labs ordered are listed, but only abnormal results are displayed) Labs Reviewed  COMPREHENSIVE METABOLIC PANEL WITH GFR - Abnormal; Notable for the following components:      Result Value   Glucose, Bld 169 (*)    BUN 32 (*)    Creatinine, Ser 1.37 (*)  GFR, Estimated 51 (*)    All other components within normal limits  LACTIC ACID, PLASMA - Abnormal; Notable for the following components:   Lactic Acid, Venous 2.4 (*)    All other components within normal limits  CBC WITH DIFFERENTIAL/PLATELET - Abnormal; Notable for the following components:   WBC 10.8 (*)    RBC 3.50 (*)    Hemoglobin 11.2 (*)    HCT 34.7 (*)    Neutro Abs 9.4 (*)    All other components within normal limits  URINALYSIS, W/ REFLEX TO CULTURE (INFECTION SUSPECTED) - Abnormal; Notable for the following components:   Color, Urine YELLOW (*)    APPearance HAZY (*)    Hgb urine dipstick MODERATE (*)    Protein, ur 100 (*)    Nitrite POSITIVE (*)    Leukocytes,Ua LARGE (*)    Bacteria, UA FEW (*)    All other components within normal limits  CULTURE, BLOOD (ROUTINE X 2)  CULTURE, BLOOD (ROUTINE X 2)  URINE CULTURE  LACTIC ACID, PLASMA  PROTIME-INR   Labs demonstrate bacteria in urine.  Catheterized  sample.  However based on the patient's symptoms of pain chills will initiate empiric Rocephin  for potential UTI and send for culture at this time.  Other causes still considered including bacteremia.  Mild leukocytosis.  Lactic acid slightly elevated and mild AKI  EKG  RADIOLOGY  CT ABDOMEN PELVIS W CONTRAST Result Date: 08/05/2024 CLINICAL DATA:  Left lower quadrant abdominal pain. Status post surgical hernia repair. EXAM: CT ABDOMEN AND PELVIS WITH CONTRAST TECHNIQUE: Multidetector CT imaging of the abdomen and pelvis was performed using the standard protocol following bolus administration of intravenous contrast. RADIATION DOSE REDUCTION: This exam was performed according to the departmental dose-optimization program which includes automated exposure control, adjustment of the mA and/or kV according to patient size and/or use of iterative reconstruction technique. CONTRAST:  OMNIPAQUE  IOHEXOL  300 MG/ML  SOLN COMPARISON:  July 15, 2024. FINDINGS: Lower chest: Minimal right basilar subsegmental atelectasis. Hepatobiliary: No focal liver abnormality is seen. Status post cholecystectomy. No biliary dilatation. Pancreas: Unremarkable. No pancreatic ductal dilatation or surrounding inflammatory changes. Spleen: Normal in size without focal abnormality. Adrenals/Urinary Tract: Adrenal glands appear normal. Bilateral renal cysts are noted. No hydronephrosis or renal obstruction is noted. Urinary bladder is decompressed secondary to Foley catheter. Stomach/Bowel: Stomach is within normal limits. Appendix appears normal. No evidence of bowel wall thickening, distention, or inflammatory changes. Vascular/Lymphatic: Aortic atherosclerosis. No enlarged abdominal or pelvic lymph nodes. Reproductive: Stable moderate prostatic enlargement. Other: No ascites. 6.7 x 2.9 cm fluid collection is noted in left inguinal canal most consistent with postoperative seroma. Small fat containing supraumbilical ventral hernia is  noted adjacent to postoperative change from prior ventral hernia repair. Musculoskeletal: No acute or significant osseous findings. IMPRESSION: 6.7 x 2.9 cm fluid collection is noted in left inguinal canal most consistent with postoperative seroma. Small fat containing supraumbilical ventral hernia is noted. Stable moderate prostatic enlargement. Aortic Atherosclerosis (ICD10-I70.0). Electronically Signed   By: Lynwood Landy Raddle M.D.   On: 08/05/2024 11:15   DG Chest Port 1 View Result Date: 08/05/2024 CLINICAL DATA:  Sepsis, fever. EXAM: PORTABLE CHEST 1 VIEW COMPARISON:  July 11, 2024. FINDINGS: The heart size and mediastinal contours are within normal limits. Both lungs are clear. Stable elevated right hemidiaphragm. The visualized skeletal structures are unremarkable. IMPRESSION: No active disease. Electronically Signed   By: Lynwood Landy Raddle M.D.   On: 08/05/2024 09:20    Chest  x-ray interpreted by me as negative for acute finding.  CT did notes fluid collection, Dr. Phillip to see and provide consultation.   PROCEDURES:  Critical Care performed: No  Procedures   MEDICATIONS ORDERED IN ED: Medications  morphine  (PF) 2 MG/ML injection 2 mg (has no administration in time range)  acetaminophen  (TYLENOL ) tablet 1,000 mg (has no administration in time range)  sodium chloride  0.9 % bolus 1,000 mL (0 mLs Intravenous Stopped 08/05/24 1101)  cefTRIAXone  (ROCEPHIN ) 2 g in sodium chloride  0.9 % 100 mL IVPB (0 g Intravenous Stopped 08/05/24 1101)  ondansetron  (ZOFRAN ) injection 4 mg (4 mg Intravenous Given 08/05/24 0921)  morphine  (PF) 2 MG/ML injection 2 mg (2 mg Intravenous Given 08/05/24 0924)  iohexol  (OMNIPAQUE ) 300 MG/ML solution 100 mL (100 mLs Intravenous Contrast Given 08/05/24 1000)     IMPRESSION / MDM / ASSESSMENT AND PLAN / ED COURSE  I reviewed the triage vital signs and the nursing notes.                              Differential diagnosis includes, but is not limited to,  postoperative left inguinal hernia repair.  Foley catheter in place query possible urinary tract infection though other causes such as abscess, medication cause, postoperative infection, recurrent incarceration, perforation etc. are also strongly considered.  Proceed forward obtaining surgical consultation CT scan of the abdomen pelvis.  Hydrating providing pain relief, antiemetic.  No acute cardiopulmonary or neurologic symptoms.  No acute vascular symptoms.  Foley appears to be draining well and functioning though query possible UTI though he did just have the Foley catheter exchanged yesterday as well.  Patient's presentation is most consistent with acute complicated illness / injury requiring diagnostic workup.      Clinical Course as of 08/05/24 1143  Tue Aug 05, 2024  1055 Dr. Jordis aware CT completed. [MQ]    Clinical Course User Index [MQ] Dicky Anes, MD   ----------------------------------------- 9:47 AM on 08/05/2024 ----------------------------------------- Dr. Tye has acknowledged consult.  Either himself or Dr. Jordis will provide consult in ER today  ----------------------------------------- 11:42 AM on 08/05/2024 ----------------------------------------- Suspect symptoms likely related to catheter/UTI.  Had catheter exchange yesterday.  Will start with Rocephin .  Culture pending.  He does have mild tachypnea low-grade fever vomited and suprapubic pain with mild leukocytosis I am suspicious that he may be developing an early sepsis-like picture though he does not technically qualify by SIRS criteria but moderate to high concern for developing sepsis from a clinical perspective.  Will admit for further care and treatment.  Advises morphine  was helpful for pain, starting to increase slightly and now developing more of a body aches and chills with low-grade fever.  Patient accepted to care of Dr. Laurita, and general surgery consult to follow  FINAL CLINICAL IMPRESSION(S) / ED  DIAGNOSES   Final diagnoses:  Urinary tract infection associated with indwelling urethral catheter, initial encounter (HCC)  Urinary tract infection associated with catheterization of urinary tract, unspecified indwelling urinary catheter type, initial encounter [facility])     Rx / DC Orders   ED Discharge Orders     None        Note:  This document was prepared using Dragon voice recognition software and may include unintentional dictation errors.   Dicky Anes, MD 08/05/24 1143

## 2024-08-05 NOTE — ED Triage Notes (Signed)
 Pt to ED with daughter for post op problem. Had hernia surgery 8/6 and last night began having chills, fever (no temp taken),  groin pain and periumbilical pain. Pt also has indwelling foley cath that was replaced yesterday because was still retaining >639mL urine after other one was removed.  Hx sepsis.

## 2024-08-05 NOTE — ED Notes (Signed)
 Pt stated he began experiencing chills yesterday. Pt had recent abdominal surgery and still experiencing pain at surgical site. Pt has foley from surgery still in place. Pt had foley bag replaced yesterday. Pt is reporting feeling cold and shaking at the moment.

## 2024-08-05 NOTE — ED Notes (Signed)
 Called CCMD for monitoring

## 2024-08-06 DIAGNOSIS — N3000 Acute cystitis without hematuria: Secondary | ICD-10-CM | POA: Diagnosis not present

## 2024-08-06 DIAGNOSIS — E785 Hyperlipidemia, unspecified: Secondary | ICD-10-CM | POA: Diagnosis present

## 2024-08-06 DIAGNOSIS — Z96653 Presence of artificial knee joint, bilateral: Secondary | ICD-10-CM | POA: Diagnosis present

## 2024-08-06 DIAGNOSIS — N390 Urinary tract infection, site not specified: Secondary | ICD-10-CM | POA: Diagnosis present

## 2024-08-06 DIAGNOSIS — R339 Retention of urine, unspecified: Secondary | ICD-10-CM | POA: Diagnosis not present

## 2024-08-06 DIAGNOSIS — I2510 Atherosclerotic heart disease of native coronary artery without angina pectoris: Secondary | ICD-10-CM | POA: Diagnosis present

## 2024-08-06 DIAGNOSIS — K91872 Postprocedural seroma of a digestive system organ or structure following a digestive system procedure: Secondary | ICD-10-CM | POA: Diagnosis present

## 2024-08-06 DIAGNOSIS — D649 Anemia, unspecified: Secondary | ICD-10-CM | POA: Diagnosis present

## 2024-08-06 DIAGNOSIS — B961 Klebsiella pneumoniae [K. pneumoniae] as the cause of diseases classified elsewhere: Secondary | ICD-10-CM | POA: Diagnosis present

## 2024-08-06 DIAGNOSIS — I480 Paroxysmal atrial fibrillation: Secondary | ICD-10-CM | POA: Diagnosis present

## 2024-08-06 DIAGNOSIS — A419 Sepsis, unspecified organism: Secondary | ICD-10-CM | POA: Diagnosis present

## 2024-08-06 DIAGNOSIS — Z87891 Personal history of nicotine dependence: Secondary | ICD-10-CM | POA: Diagnosis not present

## 2024-08-06 DIAGNOSIS — E039 Hypothyroidism, unspecified: Secondary | ICD-10-CM | POA: Diagnosis present

## 2024-08-06 DIAGNOSIS — Z8249 Family history of ischemic heart disease and other diseases of the circulatory system: Secondary | ICD-10-CM | POA: Diagnosis not present

## 2024-08-06 DIAGNOSIS — N400 Enlarged prostate without lower urinary tract symptoms: Secondary | ICD-10-CM | POA: Diagnosis present

## 2024-08-06 DIAGNOSIS — Z955 Presence of coronary angioplasty implant and graft: Secondary | ICD-10-CM | POA: Diagnosis not present

## 2024-08-06 DIAGNOSIS — Y846 Urinary catheterization as the cause of abnormal reaction of the patient, or of later complication, without mention of misadventure at the time of the procedure: Secondary | ICD-10-CM | POA: Diagnosis present

## 2024-08-06 DIAGNOSIS — I252 Old myocardial infarction: Secondary | ICD-10-CM | POA: Diagnosis not present

## 2024-08-06 DIAGNOSIS — I129 Hypertensive chronic kidney disease with stage 1 through stage 4 chronic kidney disease, or unspecified chronic kidney disease: Secondary | ICD-10-CM | POA: Diagnosis present

## 2024-08-06 DIAGNOSIS — T83511A Infection and inflammatory reaction due to indwelling urethral catheter, initial encounter: Secondary | ICD-10-CM | POA: Diagnosis present

## 2024-08-06 DIAGNOSIS — Z860100 Personal history of colon polyps, unspecified: Secondary | ICD-10-CM | POA: Diagnosis not present

## 2024-08-06 DIAGNOSIS — Z85828 Personal history of other malignant neoplasm of skin: Secondary | ICD-10-CM | POA: Diagnosis not present

## 2024-08-06 DIAGNOSIS — I4510 Unspecified right bundle-branch block: Secondary | ICD-10-CM | POA: Diagnosis present

## 2024-08-06 DIAGNOSIS — N182 Chronic kidney disease, stage 2 (mild): Secondary | ICD-10-CM | POA: Diagnosis present

## 2024-08-06 DIAGNOSIS — I255 Ischemic cardiomyopathy: Secondary | ICD-10-CM | POA: Diagnosis present

## 2024-08-06 DIAGNOSIS — F32A Depression, unspecified: Secondary | ICD-10-CM | POA: Diagnosis present

## 2024-08-06 DIAGNOSIS — Z79890 Hormone replacement therapy (postmenopausal): Secondary | ICD-10-CM | POA: Diagnosis not present

## 2024-08-06 LAB — BASIC METABOLIC PANEL WITH GFR
Anion gap: 13 (ref 5–15)
BUN: 29 mg/dL — ABNORMAL HIGH (ref 8–23)
CO2: 21 mmol/L — ABNORMAL LOW (ref 22–32)
Calcium: 8.3 mg/dL — ABNORMAL LOW (ref 8.9–10.3)
Chloride: 105 mmol/L (ref 98–111)
Creatinine, Ser: 1.58 mg/dL — ABNORMAL HIGH (ref 0.61–1.24)
GFR, Estimated: 43 mL/min — ABNORMAL LOW (ref >60)
Glucose, Bld: 117 mg/dL — ABNORMAL HIGH (ref 70–99)
Potassium: 4.3 mmol/L (ref 3.5–5.1)
Sodium: 139 mmol/L (ref 135–145)

## 2024-08-06 LAB — RESP PANEL BY RT-PCR (RSV, FLU A&B, COVID)  RVPGX2
Influenza A by PCR: NEGATIVE
Influenza B by PCR: NEGATIVE
Resp Syncytial Virus by PCR: NEGATIVE
SARS Coronavirus 2 by RT PCR: NEGATIVE

## 2024-08-06 LAB — CBC
HCT: 28.8 % — ABNORMAL LOW (ref 39.0–52.0)
Hemoglobin: 9.3 g/dL — ABNORMAL LOW (ref 13.0–17.0)
MCH: 32.1 pg (ref 26.0–34.0)
MCHC: 32.3 g/dL (ref 30.0–36.0)
MCV: 99.3 fL (ref 80.0–100.0)
Platelets: 123 K/uL — ABNORMAL LOW (ref 150–400)
RBC: 2.9 MIL/uL — ABNORMAL LOW (ref 4.22–5.81)
RDW: 13.9 % (ref 11.5–15.5)
WBC: 8.8 K/uL (ref 4.0–10.5)
nRBC: 0 % (ref 0.0–0.2)

## 2024-08-06 MED ORDER — ACETAMINOPHEN 650 MG RE SUPP: 650.0000 mg | Freq: Four times a day (QID) | RECTAL | Status: DC | PRN

## 2024-08-06 MED ORDER — GUAIFENESIN-DM 100-10 MG/5ML PO SYRP
10.0000 mL | ORAL_SOLUTION | Freq: Four times a day (QID) | ORAL | Status: DC | PRN
  Administered 2024-08-06 – 2024-08-08 (×5): 10 mL via ORAL
  Filled 2024-08-06 (×4): qty 10

## 2024-08-06 MED ORDER — ORAL CARE MOUTH RINSE: 15.0000 mL | OROMUCOSAL | Status: DC | PRN

## 2024-08-06 MED ORDER — ACETAMINOPHEN 325 MG PO TABS
650.0000 mg | ORAL_TABLET | Freq: Four times a day (QID) | ORAL | Status: DC | PRN
  Administered 2024-08-06 – 2024-08-08 (×7): 650 mg via ORAL
  Filled 2024-08-06 (×5): qty 2

## 2024-08-06 MED ORDER — CHLORHEXIDINE GLUCONATE CLOTH 2 % EX PADS
6.0000 | MEDICATED_PAD | Freq: Every day | CUTANEOUS | Status: DC
  Administered 2024-08-06 – 2024-08-07 (×3): 6 via TOPICAL

## 2024-08-06 MED ORDER — POLYVINYL ALCOHOL 1.4 % OP SOLN
1.0000 [drp] | OPHTHALMIC | Status: DC | PRN
  Administered 2024-08-06 – 2024-08-07 (×3): 1 [drp] via OPHTHALMIC
  Filled 2024-08-06: qty 15

## 2024-08-06 NOTE — Care Management Obs Status (Signed)
 MEDICARE OBSERVATION STATUS NOTIFICATION   Patient Details  Name: Gary Mueller MRN: [id number] Date of Birth: [birth year]   Medicare Observation Status Notification Given:  No (did not want a copy)    [name] 08/06/2024, 12:29 PM

## 2024-08-06 NOTE — Progress Notes (Signed)
 Per [facility] ADT, DW MOD, MSSP, CCM  MSSP CCM Inpatient and ED at [facility] 08/06/24 (Wednesday)  Recent ED visit Location: [facility]. Date: 08/05/2024.  08/06/2024- Inpatient   08/05/2024 8:27 AM EDT - Present Hospital Encounter [facility] GENERAL SURGERY  [address]  [place], [place] [address]  [phone]     [name] Brow To Enroll Patient  A [facility] Coordinator has reviewed patients chart.   Gary Mueller, was identified by admission/discharge/transfer alert as a candidate for [facility] care management services.   At this time, Gary Mueller has not been discharged from the hospital and is unable to be enrolled in [facility] Management services.  Please consider discussing the benefits of [facility] with Gary Mueller and referring again at a later date.  CRYSTAL WILLIAMS    [address]; [place], [place] [address] l  [email or web address] l [phone]   For more information on [facility] services, click here.

## 2024-08-06 NOTE — Progress Notes (Signed)
 PROGRESS NOTE    Gary Mueller  FMW:[id number] DOB: [birth year] DOA: 08/05/2024 PCP: Fernande Ophelia [name], MD  [id number]  LOS: 0 days   Brief hospital course:   Assessment & Plan: Gary Mueller is a 83 y.o. male with medical history significant of recent inguinal hernia repair, chronic urinary retention on Foley, HTN, HLD, PAF not on anticoagulation, CKD stage III, BPH, CAD status post stenting, presented with new onset fever and worsening of abdominal pain.   Patient underwent left inguinal hernia repair 1 week ago and discharged home.  Since then, he has had intermittent left-sided abdominal pain, he attributed to surgical insertion site pain, he takes oxycodone  for pain control and developed severe constipation for which he uses Colace twice daily with some relief.  Patient has a chronic Foley for BPH.  Saturday he started to notice streaks of blood in the Foley and went to see urology yesterday, a voiding trial was not successful and patient had Foley exchanged and sent home.  Overnight patient started to have chills and subjective fever and worsening of left lower abdominal pain and came back to hospital.   SIRS - tachycardia, tachypnea, leukocytosis.  Presume source UTI --no symptoms signs of pneumonia, surgical site appears to be within normal limits, underneath fluid collection on CT scan appears to be seroma, agreed with General surgeon.  Resp panel neg. --RVP  Complicated UTI --foley exchanged recently --cont ceftriaxone   Cough --recently developed.  Resp panel neg. --RVP   Recent left inguinal hernia repair - Still have some postsurgical pain and tenderness, image study showed fluid collection likely seroma, general surgeon consulted. --pain management   CKD stage II - Creatinine has been stable   Urinary retention on chronic indwelling Foley catheter - Foley exchange was done on 8/11 after failing outpatient voiding trial. --cont Foley - Outpatient urology  follow-up    HTN - BP borderline low, monitor off home BP meds - As needed hydralazine    PAF - In sinus rhythm --not on anticoagulation   Hypothyroidism - Continue Synthroid    DVT prophylaxis: Lovenox  SQ Code Status: Full code  Family Communication: daughter updated at bedside today Level of care: Med-Surg Dispo:   The patient is from: home Anticipated d/c is to: home Anticipated d/c date is: 1-2 days   Subjective and Interval History:  Pt reported left groin pain improved with pain med.  No urinary symptom.  Started having cough.   Objective: Vitals:   08/06/24 0321 08/06/24 0754 08/06/24 1720 08/06/24 1943  BP: (!) 116/49 (!) 104/46 (!) 122/48 (!) 126/52  Pulse: 72 66 69 84  Resp: 18 17 16 20   Temp: 98.6 F (37 C) 98.5 F (36.9 C) 98.1 F (36.7 C) 98.9 F (37.2 C)  TempSrc: Oral   Oral  SpO2: 94% 92% 95% 92%  Weight:      Height:        Intake/Output Summary (Last 24 hours) at 08/06/2024 2228 Last data filed at 08/06/2024 1615 Gross per 24 hour  Intake 420 ml  Output 500 ml  Net -80 ml   Filed Weights   08/05/24 0825  Weight: 77.1 kg    Examination:   Constitutional: NAD, AAOx3 HEENT: conjunctivae and lids normal, EOMI CV: No cyanosis.   RESP: normal respiratory effort, on RA Neuro: II - XII grossly intact.   Psych: Normal mood and affect.  Appropriate judgement and reason   Data Reviewed: I have personally reviewed labs and imaging studies  Time spent:  50 minutes  Ellouise Haber, MD [facility] If 7PM-7AM, please contact night-coverage 08/06/2024, 10:28 PM

## 2024-08-07 ENCOUNTER — Encounter

## 2024-08-07 DIAGNOSIS — N3000 Acute cystitis without hematuria: Secondary | ICD-10-CM | POA: Diagnosis not present

## 2024-08-07 LAB — MAGNESIUM: Magnesium: 1.9 mg/dL (ref 1.7–2.4)

## 2024-08-07 LAB — URINE CULTURE: Culture: >=100000 — AB

## 2024-08-07 LAB — CBC
HCT: 26.5 % — ABNORMAL LOW (ref 39.0–52.0)
Hemoglobin: 8.7 g/dL — ABNORMAL LOW (ref 13.0–17.0)
MCH: 31.8 pg (ref 26.0–34.0)
MCHC: 32.8 g/dL (ref 30.0–36.0)
MCV: 96.7 fL (ref 80.0–100.0)
Platelets: 100 K/uL — ABNORMAL LOW (ref 150–400)
RBC: 2.74 MIL/uL — ABNORMAL LOW (ref 4.22–5.81)
RDW: 14 % (ref 11.5–15.5)
WBC: 9.3 K/uL (ref 4.0–10.5)
nRBC: 0 % (ref 0.0–0.2)

## 2024-08-07 LAB — BASIC METABOLIC PANEL WITH GFR
Anion gap: 7 (ref 5–15)
BUN: 28 mg/dL — ABNORMAL HIGH (ref 8–23)
CO2: 22 mmol/L (ref 22–32)
Calcium: 8 mg/dL — ABNORMAL LOW (ref 8.9–10.3)
Chloride: 106 mmol/L (ref 98–111)
Creatinine, Ser: 1.6 mg/dL — ABNORMAL HIGH (ref 0.61–1.24)
GFR, Estimated: 42 mL/min — ABNORMAL LOW (ref >60)
Glucose, Bld: 125 mg/dL — ABNORMAL HIGH (ref 70–99)
Potassium: 3.7 mmol/L (ref 3.5–5.1)
Sodium: 135 mmol/L (ref 135–145)

## 2024-08-07 LAB — RESPIRATORY PANEL BY PCR

## 2024-08-07 LAB — D-DIMER, QUANTITATIVE: D-Dimer, Quant: 0.76 ug{FEU}/mL — ABNORMAL HIGH (ref 0.00–0.50)

## 2024-08-07 LAB — BRAIN NATRIURETIC PEPTIDE: B Natriuretic Peptide: 176.1 pg/mL — ABNORMAL HIGH (ref 0.0–100.0)

## 2024-08-07 MED ORDER — SODIUM CHLORIDE 0.9 % IV SOLN: INTRAVENOUS | Status: AC

## 2024-08-07 NOTE — Care Management Important Message (Signed)
 Important Message  Patient Details  Name: Gary Mueller MRN: [id number] Date of Birth: 30-Nov-1941   Important Message Given:  Yes - Medicare IM     [name] 08/07/2024, 4:46 PM

## 2024-08-07 NOTE — Progress Notes (Addendum)
 PROGRESS NOTE    Gary Mueller  FMW:[id number] DOB: 04-05-1941 DOA: 08/05/2024 PCP: Fernande Ophelia [name], MD  209A/[id number]  LOS: 1 day   Brief hospital course:   Assessment & Plan: Gary Mueller is a 83 y.o. male with medical history significant of recent inguinal hernia repair, chronic urinary retention on Foley, HTN, HLD, PAF not on anticoagulation, CKD stage III, BPH, CAD status post stenting, presented with new onset fever and worsening of abdominal pain.   Patient underwent left inguinal hernia repair 1 week ago and discharged home.  Since then, he has had intermittent left-sided abdominal pain, he attributed to surgical insertion site pain, he takes oxycodone  for pain control and developed severe constipation for which he uses Colace twice daily with some relief.  Patient has a chronic Foley for BPH.  Saturday he started to notice streaks of blood in the Foley and went to see urology yesterday, a voiding trial was not successful and patient had Foley exchanged and sent home.  Overnight patient started to have chills and subjective fever and worsening of left lower abdominal pain and came back to hospital.   Sepsis - tachycardia, tachypnea, leukocytosis.  Presume source UTI --CXR clear, no symptoms signs of pneumonia, surgical site appears to be within normal limits, underneath fluid collection on CT scan appears to be seroma, agreed by General surgeon.    Complicated UTI 2/2 Klebsiella --foley exchanged on 8/11 --cont ceftriaxone , can discharge with cefadroxil  1g po BID to finish 5-day course.  Cough and dyspnea --CXR clear, resp panel and RVP neg, no hypoxia, no respiratory distress.  D-dimer 0.76 which is not considered high for this age group.  I talked to pt and daughter extensively yesterday and today due to their persistent concern for cough and dyspnea.  If concerned about PNA, pt is already on ceftriaxone , so essentially being treated. If it's viral, then supportive care. No hx  of COPD or asthma, so no need for steroid.  Recent Echo unremarkable, BNP 176, CXR clear, not consistent with fluid overload.  --dyspnea reported worse with conversation, however, pt was able to ambulate around nurse's station today. --Robitussin PRN  Recent left inguinal hernia repair - Still have some postsurgical pain and tenderness, image study showed fluid collection likely seroma, general surgeon consulted. --Norco PRN   CKD stage II --Cr mildly elevated --start gentle MIVF   Urinary retention on chronic indwelling Foley catheter - Foley exchange was done on 8/11 after failing outpatient voiding trial. --urology plans to exchange Foley tomorrow - Outpatient urology follow-up    HTN - BP borderline low, monitor off home BP meds - As needed hydralazine    PAF - In sinus rhythm --not on anticoagulation   Hypothyroidism - Continue Synthroid   Chronic anemia --some drop in Hgb likely due to hematuria, blood draws and dilutional    DVT prophylaxis: Lovenox  SQ Code Status: Full code  Family Communication: daughter updated at bedside today Level of care: Med-Surg Dispo:   The patient is from: home Anticipated d/c is to: home Anticipated d/c date is: 1-2 days   Subjective and Interval History:  RN and daughter reported pt had severe dyspnea and cough.  During rounds, pt appeared comfortable, no hypoxia.  Reported no pain.   Objective: Vitals:   08/07/24 0422 08/07/24 0814 08/07/24 1534 08/07/24 2005  BP: 119/68 132/69 (!) 99/51 (!) 106/92  Pulse: 72 80 63 72  Resp: 18 (!) 22 16 18   Temp: 98.5 F (36.9 C) 98.3 F (  36.8 C) 98 F (36.7 C) 98.1 F (36.7 C)  TempSrc: Oral   Oral  SpO2: 94% 92% 93% 96%  Weight:      Height:        Intake/Output Summary (Last 24 hours) at 08/07/2024 2155 Last data filed at 08/07/2024 2005 Gross per 24 hour  Intake 120 ml  Output 250 ml  Net -130 ml   Filed Weights   08/05/24 0825  Weight: 77.1 kg    Examination:    Constitutional: NAD, AAOx3 HEENT: conjunctivae and lids normal, EOMI CV: No cyanosis.   RESP: normal respiratory effort and rate, on RA Neuro: II - XII grossly intact.   Psych: Normal mood and affect.     Data Reviewed: I have personally reviewed labs and imaging studies  Time spent: 50 minutes  Ellouise Haber, MD [facility] If 7PM-7AM, please contact night-coverage 08/07/2024, 9:55 PM

## 2024-08-07 NOTE — Plan of Care (Signed)

## 2024-08-08 ENCOUNTER — Other Ambulatory Visit: Payer: Self-pay

## 2024-08-08 DIAGNOSIS — N3000 Acute cystitis without hematuria: Secondary | ICD-10-CM | POA: Diagnosis not present

## 2024-08-08 DIAGNOSIS — R339 Retention of urine, unspecified: Secondary | ICD-10-CM | POA: Diagnosis not present

## 2024-08-08 LAB — CREATININE, SERUM
Creatinine, Ser: 1.3 mg/dL — ABNORMAL HIGH (ref 0.61–1.24)
GFR, Estimated: 54 mL/min — ABNORMAL LOW (ref >60)

## 2024-08-08 MED ORDER — CEFADROXIL 500 MG PO CAPS
1000.0000 mg | ORAL_CAPSULE | Freq: Two times a day (BID) | ORAL | 0 refills | Status: AC
  Filled 2024-08-08: qty 4, 1d supply, fill #0

## 2024-08-08 MED ORDER — GUAIFENESIN-DM 100-10 MG/5ML PO SYRP: 10.0000 mL | ORAL_SOLUTION | Freq: Four times a day (QID) | ORAL | Status: DC | PRN

## 2024-08-08 NOTE — Progress Notes (Signed)
 Cath Change/ Replacement  Patient is present today for a catheter change due to urinary retention.  8ml of water was removed from the balloon, a 16FR coude foley cath was removed without difficulty.  Patient was cleaned and prepped in a sterile fashion with betadine  and 2% lidocaine  jelly was instilled into the urethra. A 16 FR coude foley cath was replaced into the bladder, no complications were noted. Urine return was not noted but catheter was hubbed. The balloon was filled with 10ml of sterile water. A night bag was attached for drainage.  Patient tolerated well.    Performed by: Silvester Reierson, PA-C   Follow up: Outpatient cysto with Dr. Twylla next week

## 2024-08-08 NOTE — Plan of Care (Signed)

## 2024-08-08 NOTE — Discharge Summary (Signed)
 Physician Discharge Summary   Gary Mueller  male DOB: Jul 20, 1941  FMW:[id number]  PCP: Gary Ophelia [name], MD  Admit date: 08/05/2024 Discharge date: 08/08/2024  Admitted From: home Disposition:  home Daughter updated at bedside prior to discharge. Home Health: Yes CODE STATUS: Full code  Discharge Instructions     Diet - low sodium heart healthy   Complete by: As directed       Hospital Course:  For full details, please see H&P, progress notes, consult notes and ancillary notes.  Briefly,  Gary Mueller is a 83 y.o. male with medical history significant of recent inguinal hernia repair, chronic urinary retention on Foley, HTN, PAF not on anticoagulation, CKD stage III, BPH, CAD status post stenting, presented with new onset fever and worsening of abdominal pain.   Patient underwent left inguinal hernia repair 1 week ago and discharged home.  Since then, he has had intermittent left-sided abdominal pain, he attributed to surgical insertion site pain, he takes oxycodone  for pain control and developed severe constipation for which he uses Colace twice daily with some relief.  Patient has a chronic Foley for BPH.  Saturday he started to notice streaks of blood in the Foley and went to see urology, a voiding trial was not successful and patient had Foley exchanged and sent home.  Overnight patient started to have chills and subjective fever and worsening of left lower abdominal pain and came back to hospital.   Sepsis - tachycardia, tachypnea, leukocytosis.  Presume source UTI --CXR clear, no symptoms signs of pneumonia, surgical site appears to be within normal limits, underneath fluid collection on CT scan appears to be seroma, agreed by General surgeon.     Complicated UTI 2/2 Klebsiella --foley exchanged on 8/11 PTA. --received 5 days of ceftriaxone  f/b 1 day of cefdroxil. --Foley was exchanged again while inpatient by urology on 08/08/24 while pt was on ceftriaxone .   Cough  and dyspnea --CXR clear, resp panel and RVP neg, no hypoxia, no respiratory distress.  D-dimer 0.76 which is not considered high for this age group.  I talked to pt and daughter extensively due to their persistent concern for cough and dyspnea.  If concerned about PNA, pt is already on ceftriaxone , so essentially being treated. If it's viral, then supportive care. No hx of COPD or asthma, so no need for steroid.  Recent Echo unremarkable, BNP 176, CXR clear, not consistent with fluid overload.  --dyspnea reported worse with conversation, however, pt was able to ambulate around nurse's station on room air. --Robitussin PRN   Recent left inguinal hernia repair - Still had some postsurgical pain and tenderness, image study showed fluid collection likely seroma, general surgeon consulted.  Pain had improved prior to discharge.   CKD stage II --Cr mildly elevated, improved with gentle MIVF.   Urinary retention on chronic indwelling Foley catheter - Foley exchange was done on 8/11 after failing outpatient voiding trial. --Foley was exchanged again while inpatient by urology on 08/08/24 while pt was on ceftriaxone . - Outpatient urology follow-up    HTN - BP borderline low, antihypertensives held during hospitalization. --resume home Toprol  after discharge. --not taking lasix PTA --d/c'ed home amlodipine .   PAF - In sinus rhythm --not on anticoagulation   Hypothyroidism - Continue Synthroid    Chronic anemia --some drop in Hgb likely due to hematuria, blood draws and dilutional    Discharge Diagnoses:  Principal Problem:   UTI (urinary tract infection) Active Problems:   Sepsis secondary  to UTI [facility])   30 Day Unplanned Readmission Risk Score    Flowsheet Row ED to Hosp-Admission (Current) from 08/05/2024 in [facility]  30 Day Unplanned Readmission Risk Score (%) 21.88 Filed at 08/08/2024 0801    This score is the patient's risk of an unplanned  readmission within 30 days of being discharged (0 -100%). The score is based on dignosis, age, lab data, medications, orders, and past utilization.   Low:  0-14.9   Medium: 15-21.9   High: 22-29.9   Extreme: 30 and above         Discharge Instructions:  Allergies as of 08/08/2024       Reactions   Codeine    GI intolerance; hallucinations    Statins    Muscle pain   Tamsulosin  Cough        Medication List     STOP taking these medications    amLODipine  5 MG tablet Commonly known as: NORVASC    furosemide 20 MG tablet Commonly known as: LASIX   HYDROcodone -acetaminophen  5-325 MG tablet Commonly known as: NORCO/VICODIN   potassium chloride 10 MEQ tablet Commonly known as: KLOR-CON       TAKE these medications    ascorbic acid  1000 MG tablet Commonly known as: VITAMIN C Take 1 tablet by mouth daily.   cefadroxil  500 MG capsule Commonly known as: DURICEF Take 2 capsules (1,000 mg total) by mouth 2 (two) times daily for 1 day. Start taking on: August 09, 2024   cyanocobalamin  1000 MCG tablet Take 1 tablet (1,000 mcg total) by mouth daily.   guaiFENesin -dextromethorphan  100-10 MG/5ML syrup Commonly known as: ROBITUSSIN DM Take 10 mLs by mouth every 6 (six) hours as needed for cough.   levothyroxine  75 MCG tablet Commonly known as: SYNTHROID  Take 75 mcg by mouth daily before breakfast.   metoprolol  succinate 25 MG 24 hr tablet Commonly known as: TOPROL -XL TAKE 1/2 TABLET BY MOUTH EVERY EVENING   multivitamin with minerals Tabs tablet Take 1 tablet by mouth every evening.   pantoprazole  40 MG tablet Commonly known as: PROTONIX  Take 1 tablet (40 mg total) by mouth 2 (two) times daily before a meal.   rosuvastatin  40 MG tablet Commonly known as: CRESTOR  TAKE ONE TABLET BY MOUTH EVERY DAY   tamsulosin  0.4 MG Caps capsule Commonly known as: FLOMAX  Take 1 capsule (0.4 mg total) by mouth daily.         Follow-up Information     Gary Ophelia PARAS  III, MD Follow up in 1 week(s).   Specialty: Internal Medicine Contact information: [address] [facility] [place] [address] [phone]                 Allergies  Allergen Reactions   Codeine     GI intolerance; hallucinations    Statins     Muscle pain   Tamsulosin  Cough     The results of significant diagnostics from this hospitalization (including imaging, microbiology, ancillary and laboratory) are listed below for reference.   Consultations:   Procedures/Studies: CT ABDOMEN PELVIS W CONTRAST Result Date: 08/05/2024 CLINICAL DATA:  Left lower quadrant abdominal pain. Status post surgical hernia repair. EXAM: CT ABDOMEN AND PELVIS WITH CONTRAST TECHNIQUE: Multidetector CT imaging of the abdomen and pelvis was performed using the standard protocol following bolus administration of intravenous contrast. RADIATION DOSE REDUCTION: This exam was performed according to the departmental dose-optimization program which includes automated exposure control, adjustment of the mA and/or  kV according to patient size and/or use of iterative reconstruction technique. CONTRAST:  OMNIPAQUE  IOHEXOL  300 MG/ML  SOLN COMPARISON:  July 15, 2024. FINDINGS: Lower chest: Minimal right basilar subsegmental atelectasis. Hepatobiliary: No focal liver abnormality is seen. Status post cholecystectomy. No biliary dilatation. Pancreas: Unremarkable. No pancreatic ductal dilatation or surrounding inflammatory changes. Spleen: Normal in size without focal abnormality. Adrenals/Urinary Tract: Adrenal glands appear normal. Bilateral renal cysts are noted. No hydronephrosis or renal obstruction is noted. Urinary bladder is decompressed secondary to Foley catheter. Stomach/Bowel: Stomach is within normal limits. Appendix appears normal. No evidence of bowel wall thickening, distention, or inflammatory changes. Vascular/Lymphatic: Aortic atherosclerosis. No enlarged abdominal or pelvic  lymph nodes. Reproductive: Stable moderate prostatic enlargement. Other: No ascites. 6.7 x 2.9 cm fluid collection is noted in left inguinal canal most consistent with postoperative seroma. Small fat containing supraumbilical ventral hernia is noted adjacent to postoperative change from prior ventral hernia repair. Musculoskeletal: No acute or significant osseous findings. IMPRESSION: 6.7 x 2.9 cm fluid collection is noted in left inguinal canal most consistent with postoperative seroma. Small fat containing supraumbilical ventral hernia is noted. Stable moderate prostatic enlargement. Aortic Atherosclerosis (ICD10-I70.0). Electronically Signed   By: Lynwood Landy Raddle M.D.   On: 08/05/2024 11:15   DG Chest Port 1 View Result Date: 08/05/2024 CLINICAL DATA:  Sepsis, fever. EXAM: PORTABLE CHEST 1 VIEW COMPARISON:  July 11, 2024. FINDINGS: The heart size and mediastinal contours are within normal limits. Both lungs are clear. Stable elevated right hemidiaphragm. The visualized skeletal structures are unremarkable. IMPRESSION: No active disease. Electronically Signed   By: Lynwood Landy Raddle M.D.   On: 08/05/2024 09:20   ECHOCARDIOGRAM COMPLETE Result Date: 07/16/2024    ECHOCARDIOGRAM REPORT   Patient Name:   RIGHTEOUS CLAIBORNE Date of Exam: 07/15/2024 Medical Rec #:  [id number]      Height:       72.0 in Accession #:    [id number]     Weight:       170.0 lb Date of Birth:  1941/09/19     BSA:          1.988 m Patient Age:    82 years       BP:           116/58 mmHg Patient Gender: M              HR:           58 bpm. Exam Location:  [facility] Procedure: 2D Echo, Cardiac Doppler and Color Doppler (Both Spectral and Color            Flow Doppler were utilized during procedure). Indications:     R94.31 Abnormal EKG  History:         Patient has no prior history of Echocardiogram examinations.                  Risk Factors:Hypertension, Dyslipidemia and Former Smoker.  Sonographer:     Carl Coma RDCS Referring Phys:   [id number] [facility] Diagnosing Phys: Marsa Dooms MD IMPRESSIONS  1. Left ventricular ejection fraction, by estimation, is 55 to 60%. The left ventricle has normal function. The left ventricle has no regional wall motion abnormalities. Left ventricular diastolic parameters are consistent with Grade I diastolic dysfunction (impaired relaxation).  2. Right ventricular systolic function is normal. The right ventricular size is normal.  3. The mitral valve is normal in structure. Mild mitral valve regurgitation. No evidence of mitral stenosis.  4. The aortic valve is normal in structure. Aortic valve regurgitation is not visualized. No aortic stenosis is present.  5. The inferior vena cava is normal in size with greater than 50% respiratory variability, suggesting right atrial pressure of 3 mmHg. FINDINGS  Left Ventricle: Left ventricular ejection fraction, by estimation, is 55 to 60%. The left ventricle has normal function. The left ventricle has no regional wall motion abnormalities. Strain was performed and the global longitudinal strain is indeterminate. The left ventricular internal cavity size was normal in size. There is no left ventricular hypertrophy. Left ventricular diastolic parameters are consistent with Grade I diastolic dysfunction (impaired relaxation). Right Ventricle: The right ventricular size is normal. No increase in right ventricular wall thickness. Right ventricular systolic function is normal. Left Atrium: Left atrial size was normal in size. Right Atrium: Right atrial size was normal in size. Pericardium: There is no evidence of pericardial effusion. Mitral Valve: The mitral valve is normal in structure. Mild mitral valve regurgitation. No evidence of mitral valve stenosis. Tricuspid Valve: The tricuspid valve is normal in structure. Tricuspid valve regurgitation is mild . No evidence of tricuspid stenosis. Aortic Valve: The aortic valve is normal in structure. Aortic valve regurgitation  is not visualized. No aortic stenosis is present. Pulmonic Valve: The pulmonic valve was normal in structure. Pulmonic valve regurgitation is not visualized. No evidence of pulmonic stenosis. Aorta: The aortic root is normal in size and structure. Venous: The inferior vena cava is normal in size with greater than 50% respiratory variability, suggesting right atrial pressure of 3 mmHg. IAS/Shunts: No atrial level shunt detected by color flow Doppler. Additional Comments: 3D was performed not requiring image post processing on an independent workstation and was indeterminate.  LEFT VENTRICLE PLAX 2D LVIDd:         5.40 cm   Diastology LVIDs:         3.80 cm   LV e' medial:    7.94 cm/s LV PW:         1.10 cm   LV E/e' medial:  9.5 LV IVS:        1.10 cm   LV e' lateral:   12.80 cm/s LVOT diam:     2.20 cm   LV E/e' lateral: 5.9 LV SV:         86 LV SV Index:   43 LVOT Area:     3.80 cm  RIGHT VENTRICLE             IVC RV Basal diam:  4.10 cm     IVC diam: 1.80 cm RV S prime:     12.45 cm/s TAPSE (M-mode): 2.8 cm LEFT ATRIUM           Index        RIGHT ATRIUM          Index LA diam:      4.40 cm 2.21 cm/m   RA Area:     9.84 cm LA Vol (A2C): 67.4 ml 33.90 ml/m  RA Volume:   18.30 ml 9.20 ml/m LA Vol (A4C): 44.6 ml 22.43 ml/m  AORTIC VALVE LVOT Vmax:   95.60 cm/s LVOT Vmean:  64.150 cm/s LVOT VTI:    0.225 m  AORTA Ao Root diam: 3.80 cm MITRAL VALVE MV Area (PHT): 2.64 cm    SHUNTS MV Decel Time: 287 msec    Systemic VTI:  0.22 m MV E velocity: 75.70 cm/s  Systemic Diam: 2.20 cm MV A velocity: 84.65 cm/s  MV E/A ratio:  0.89 Marsa Dooms MD Electronically signed by Marsa Dooms MD Signature Date/Time: 07/16/2024/8:14:05 AM    Final    CT ABDOMEN PELVIS WO CONTRAST Result Date: 07/15/2024 CLINICAL DATA:  Acute abdominal pain. Patient reports difficulty urinating. Swelling and groin. Recent hospital discharge. EXAM: CT ABDOMEN AND PELVIS WITHOUT CONTRAST TECHNIQUE: Multidetector CT imaging of the  abdomen and pelvis was performed following the standard protocol without IV contrast. RADIATION DOSE REDUCTION: This exam was performed according to the departmental dose-optimization program which includes automated exposure control, adjustment of the mA and/or kV according to patient size and/or use of iterative reconstruction technique. COMPARISON:  GI bleeding study 07/12/2024 FINDINGS: Lower chest: Subpleural reticulation in the lung bases, right greater than left, unchanged. No confluent consolidation or pleural effusion. Hepatobiliary: Scattered punctate hepatic granuloma. No evidence of focal liver abnormality on this unenhanced exam. Cholecystectomy without biliary dilatation. Pancreas: Unremarkable. No pancreatic ductal dilatation or surrounding inflammatory changes. Spleen: Normal in size without focal abnormality. Adrenals/Urinary Tract: No adrenal nodule. Unchanged bilateral perinephric edema. Unchanged bilateral low-density renal lesions typical of cysts. No further follow-up imaging is recommended. The urinary bladder is decompressed by Foley catheter. Left aspect of the bladder dome versus diverticulum again extends into a left inguinal hernia. There is increasing stranding adjacent to the herniated bladder in the inguinal canal. Stomach/Bowel: Decompressed urinary bladder. No bowel obstruction or inflammation. Colonic diverticulosis without diverticulitis. Small to moderate colonic stool burden. Normal appendix visualized. Vascular/Lymphatic: Extensive aortic and branch atherosclerosis. The penetrating ulcer within the infrarenal aorta on contrast-enhanced exam is not well demonstrated in the absence of IV contrast. There is no periaortic stranding to suggest rupture. No abdominopelvic adenopathy. Reproductive: Enlarged prostate spans 5.2 cm. Other: Left inguinal hernia containing portion of the urinary bladder with increased stranding in the fat since prior exam. No ascites or free air.  Postsurgical change in the right upper anterior abdominal wall. Musculoskeletal: No acute osseous finding. 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 Degenerative change in the spine. IMPRESSION: 1. Left inguinal hernia containing a portion of the urinary bladder with increasing stranding in the fat since prior exam. Recommend correlation for incarceration. Urology consultation is recommended. 2. Unchanged bilateral perinephric edema. 3. Colonic diverticulosis without diverticulitis. 4. Enlarged prostate. Aortic Atherosclerosis (ICD10-I70.0). Electronically Signed   By: Andrea Gasman M.D.   On: 07/15/2024 14:34   CT ANGIO GI BLEED Result Date: 07/12/2024 CLINICAL DATA:  Lower GI bleed EXAM: CTA ABDOMEN AND PELVIS WITHOUT AND WITH CONTRAST TECHNIQUE: Multidetector CT imaging of the abdomen and pelvis was performed using the standard protocol during bolus administration of intravenous contrast. Multiplanar reconstructed images and MIPs were obtained and reviewed to evaluate the vascular anatomy. RADIATION DOSE REDUCTION: This exam was performed according to the departmental dose-optimization program which includes automated exposure control, adjustment of the mA and/or kV according to patient size and/or use of iterative reconstruction technique. CONTRAST:  OMNIPAQUE  IOHEXOL  350 MG/ML SOLN COMPARISON:  CTA chest 03/20/2024 FINDINGS: VASCULAR Aorta: Moderate calcified atheromatous plaque throughout, with a 1.5 cm penetrating atheromatous ulcer along the left posterolateral wall in the infrarenal segment. No aneurysm or stenosis. Celiac: Partially calcified ostial plaque resulting in mild short-segment stenosis, patent distally with classic trifurcation anatomy. SMA: Calcified ostial plaque without significant stenosis, mildly atheromatous but patent distally with classic branch anatomy. Renals: Single bilaterally, both with calcified ostial plaque resulting in at least mild  short-segment stenosis, patent distally. IMA: Patent without evidence of aneurysm, dissection, vasculitis or  significant stenosis. Inflow: Moderate scattered atheromatous plaque without dissection or stenosis. Fusiform 2.1 cm dilatation of the right common iliac artery, left 1.5 cm. Proximal Outflow: Mild plaque, no aneurysm, dissection, or stenosis. Veins: Patent hepatic veins, portal vein, splenic vein, SMV. Incomplete opacification of iliac venous system and IVC which appear grossly unremarkable. Review of the MIP images confirms the above findings. NON-VASCULAR Lower chest: No pleural or pericardial effusion. Coarse peripheral interstitial opacities in both lung bases right greater than left. Hepatobiliary: No focal liver abnormality is seen. Status post cholecystectomy. No biliary dilatation. Pancreas: Unremarkable. No pancreatic ductal dilatation or surrounding inflammatory changes. Spleen: Normal in size without focal abnormality. Adrenals/Urinary Tract: No adrenal mass. Symmetric renal parenchymal enhancement without hydronephrosis. Multiple cortical lesions in both kidneys, some of which can be characterized as cysts, largest 2.2 cm 15 HU right upper pole; no followup recommended. Urinary bladder is distended, with left anterolateral mass or diverticulum extending into the left inguinal hernia, containing a small punctate calcification. Stomach/Bowel: Stomach is partially distended, that acute finding. Small bowel decompressed. Normal appendix. Colon is incompletely distended, with scattered sigmoid diverticula; no adjacent inflammatory change. No active extravasation into the lumen. Lymphatic: No abdominal or pelvic adenopathy. Reproductive: Moderate prostate enlargement. Other: No ascites.  No free air. Musculoskeletal: Mild lumbar levoscoliosis apex L2 with multilevel spondylitic changes in the lower thoracic and lumbar spine. Probable AVN in the right femoral head without subchondral collapse.  IMPRESSION: 1. No evidence of active GI bleed. 2. Sigmoid diverticulosis. 3. Left anterolateral urinary bladder mass or diverticulum extending into the left inguinal hernia. Consider urologic consultation. 4. Probable AVN in the right femoral head without subchondral collapse. 5.  Aortic Atherosclerosis (ICD10-I70.0). Electronically Signed   By: JONETTA Faes M.D.   On: 07/12/2024 13:59   DG Chest Portable 1 View Result Date: 07/11/2024 CLINICAL DATA:  Shortness of breath, weakness EXAM: PORTABLE CHEST 1 VIEW COMPARISON:  March 20, 2024 and prior studies FINDINGS: Unchanged elevation of the right hemidiaphragm with right lung volume loss. Likely bibasilar scarring/atelectasis no new consolidation. Unchanged cardiomediastinal silhouette. No acute osseous findings. Radiodensity projecting over the right lung apex and possibly external to the patient. IMPRESSION: Unchanged elevation of the right hemidiaphragm. Electronically Signed   By: Michaeline Blanch M.D.   On: 07/11/2024 14:04      Labs: BNP (last 3 results) Recent Labs    08/07/24 0336  BNP 176.1*   Basic Metabolic Panel: Recent Labs  Lab 08/05/24 0826 08/06/24 0528 08/07/24 0336 08/08/24 0417  NA 140 139 135  --   K 4.4 4.3 3.7  --   CL 104 105 106  --   CO2 23 21* 22  --   GLUCOSE 169* 117* 125*  --   BUN 32* 29* 28*  --   CREATININE 1.37* 1.58* 1.60* 1.30*  CALCIUM  9.4 8.3* 8.0*  --   MG  --   --  1.9  --    Liver Function Tests: Recent Labs  Lab 08/05/24 0826  AST 23  ALT 16  ALKPHOS 56  BILITOT 0.9  PROT 6.5  ALBUMIN 3.7   No results for input(s): LIPASE, AMYLASE in the last 168 hours. No results for input(s): AMMONIA in the last 168 hours. CBC: Recent Labs  Lab 08/05/24 0826 08/06/24 0528 08/07/24 0336  WBC 10.8* 8.8 9.3  NEUTROABS 9.4*  --   --   HGB 11.2* 9.3* 8.7*  HCT 34.7* 28.8* 26.5*  MCV 99.1 99.3 96.7  PLT 188 123*  100*   Cardiac Enzymes: No results for input(s): CKTOTAL, CKMB,  CKMBINDEX, TROPONINI in the last 168 hours. BNP: Invalid input(s): POCBNP CBG: No results for input(s): GLUCAP in the last 168 hours. D-Dimer Recent Labs    08/07/24 1418  DDIMER 0.76*   Hgb A1c No results for input(s): HGBA1C in the last 72 hours. Lipid Profile No results for input(s): CHOL, HDL, LDLCALC, TRIG, CHOLHDL, LDLDIRECT in the last 72 hours. Thyroid  function studies No results for input(s): TSH, T4TOTAL, T3FREE, THYROIDAB in the last 72 hours.  Invalid input(s): FREET3 Anemia work up No results for input(s): VITAMINB12, FOLATE, FERRITIN, TIBC, IRON , RETICCTPCT in the last 72 hours. Urinalysis    Component Value Date/Time   COLORURINE YELLOW (A) 08/05/2024 0826   APPEARANCEUR HAZY (A) 08/05/2024 0826   APPEARANCEUR Cloudy (A) 08/04/2024 1501   LABSPEC 1.016 08/05/2024 0826   LABSPEC 1.016 03/17/2015 0928   PHURINE 6.0 08/05/2024 0826   GLUCOSEU NEGATIVE 08/05/2024 0826   GLUCOSEU NEGATIVE 03/17/2015 0928   HGBUR MODERATE (A) 08/05/2024 0826   BILIRUBINUR NEGATIVE 08/05/2024 0826   BILIRUBINUR Negative 08/04/2024 1501   BILIRUBINUR NEGATIVE 03/17/2015 0928   KETONESUR NEGATIVE 08/05/2024 0826   PROTEINUR 100 (A) 08/05/2024 0826   NITRITE POSITIVE (A) 08/05/2024 0826   LEUKOCYTESUR LARGE (A) 08/05/2024 0826   LEUKOCYTESUR NEGATIVE 03/17/2015 0928   Sepsis Labs Recent Labs  Lab 08/05/24 0826 08/06/24 0528 08/07/24 0336  WBC 10.8* 8.8 9.3   Microbiology Recent Results (from the past 240 hours)  Microscopic Examination     Status: Abnormal   Collection Time: 08/04/24  3:01 PM   Urine  Result Value Ref Range Status   WBC, UA >30 (A) 0 - 5 /hpf Final   RBC, Urine 3-10 (A) 0 - 2 /hpf Final   Epithelial Cells (non renal) 0-10 0 - 10 /hpf Final   Bacteria, UA Many (A) None seen/Few Final  Culture, blood (Routine x 2)     Status: None (Preliminary result)   Collection Time: 08/05/24  8:26 AM   Specimen: BLOOD   Result Value Ref Range Status   Specimen Description BLOOD RIGHT [facility]  Final   Special Requests   Final    BOTTLES DRAWN AEROBIC AND ANAEROBIC Blood Culture adequate volume   Culture   Final    NO GROWTH 3 DAYS Performed at [facility], [address] [place], [place] [address]    Report Status PENDING  Incomplete  Urine culture     Status: Abnormal   Collection Time: 08/05/24  8:26 AM   Specimen: Urine, Catheterized  Result Value Ref Range Status   Specimen Description   Final    URINE, CATHETERIZED Performed at [facility], [address] [place], [place] [address]    Special Requests   Final    NONE Performed at [facility], [address], [place], [place] [address]    Culture >=100,000 COLONIES/mL KLEBSIELLA PNEUMONIAE (A)  Final   Report Status 08/07/2024 FINAL  Final   Organism ID, Bacteria KLEBSIELLA PNEUMONIAE (A)  Final      Susceptibility   Klebsiella pneumoniae - MIC*    AMPICILLIN >=32 RESISTANT Resistant     CEFAZOLIN  Value in next row Sensitive      2 SENSITIVEThis is a modified FDA-approved test that has been validated and its performance characteristics determined by the reporting laboratory.  This laboratory is certified under the Clinical Laboratory Improvement Amendments CLIA as qualified to perform high complexity clinical  laboratory testing.    CEFEPIME Value in next row Sensitive      2 SENSITIVEThis is a modified FDA-approved test that has been validated and its performance characteristics determined by the reporting laboratory.  This laboratory is certified under the Clinical Laboratory Improvement Amendments CLIA as qualified to perform high complexity clinical laboratory testing.    ERTAPENEM Value in next row Sensitive      2 SENSITIVEThis is a modified FDA-approved test that has been validated and its performance characteristics determined by the reporting laboratory.  This laboratory is certified under the Clinical  Laboratory Improvement Amendments CLIA as qualified to perform high complexity clinical laboratory testing.    CEFTRIAXONE  Value in next row Sensitive      2 SENSITIVEThis is a modified FDA-approved test that has been validated and its performance characteristics determined by the reporting laboratory.  This laboratory is certified under the Clinical Laboratory Improvement Amendments CLIA as qualified to perform high complexity clinical laboratory testing.    CIPROFLOXACIN Value in next row Sensitive      2 SENSITIVEThis is a modified FDA-approved test that has been validated and its performance characteristics determined by the reporting laboratory.  This laboratory is certified under the Clinical Laboratory Improvement Amendments CLIA as qualified to perform high complexity clinical laboratory testing.    GENTAMICIN Value in next row Sensitive      2 SENSITIVEThis is a modified FDA-approved test that has been validated and its performance characteristics determined by the reporting laboratory.  This laboratory is certified under the Clinical Laboratory Improvement Amendments CLIA as qualified to perform high complexity clinical laboratory testing.    NITROFURANTOIN Value in next row Intermediate      2 SENSITIVEThis is a modified FDA-approved test that has been validated and its performance characteristics determined by the reporting laboratory.  This laboratory is certified under the Clinical Laboratory Improvement Amendments CLIA as qualified to perform high complexity clinical laboratory testing.    TRIMETH /SULFA  Value in next row Sensitive      2 SENSITIVEThis is a modified FDA-approved test that has been validated and its performance characteristics determined by the reporting laboratory.  This laboratory is certified under the Clinical Laboratory Improvement Amendments CLIA as qualified to perform high complexity clinical laboratory testing.    AMPICILLIN/SULBACTAM Value in next row Sensitive       2 SENSITIVEThis is a modified FDA-approved test that has been validated and its performance characteristics determined by the reporting laboratory.  This laboratory is certified under the Clinical Laboratory Improvement Amendments CLIA as qualified to perform high complexity clinical laboratory testing.    PIP/TAZO Value in next row Sensitive ug/mL     <=4 SENSITIVEThis is a modified FDA-approved test that has been validated and its performance characteristics determined by the reporting laboratory.  This laboratory is certified under the Clinical Laboratory Improvement Amendments CLIA as qualified to perform high complexity clinical laboratory testing.    MEROPENEM Value in next row Sensitive      <=4 SENSITIVEThis is a modified FDA-approved test that has been validated and its performance characteristics determined by the reporting laboratory.  This laboratory is certified under the Clinical Laboratory Improvement Amendments CLIA as qualified to perform high complexity clinical laboratory testing.    * >=100,000 COLONIES/mL KLEBSIELLA PNEUMONIAE  Culture, blood (Routine x 2)     Status: None (Preliminary result)   Collection Time: 08/05/24  8:56 AM   Specimen: BLOOD  Result Value Ref Range Status   Specimen Description BLOOD BLOOD  RIGHT FOREARM  Final   Special Requests   Final    BOTTLES DRAWN AEROBIC AND ANAEROBIC Blood Culture results may not be optimal due to an inadequate volume of blood received in culture bottles   Culture   Final    NO GROWTH 3 DAYS Performed at [facility], [address] [place], [place] [address]    Report Status PENDING  Incomplete  Resp panel by RT-PCR (RSV, Flu A&B, Covid) Anterior Nasal Swab     Status: None   Collection Time: 08/06/24  5:34 PM   Specimen: Anterior Nasal Swab  Result Value Ref Range Status   SARS Coronavirus 2 by RT PCR NEGATIVE NEGATIVE Final    Comment: (NOTE) SARS-CoV-2 target nucleic acids are NOT DETECTED.  The SARS-CoV-2  RNA is generally detectable in upper respiratory specimens during the acute phase of infection. The lowest concentration of SARS-CoV-2 viral copies this assay can detect is 138 copies/mL. A negative result does not preclude SARS-Cov-2 infection and should not be used as the sole basis for treatment or other patient management decisions. A negative result may occur with  improper specimen collection/handling, submission of specimen other than nasopharyngeal swab, presence of viral mutation(s) within the areas targeted by this assay, and inadequate number of viral copies(<138 copies/mL). A negative result must be combined with clinical observations, patient history, and epidemiological information. The expected result is Negative.  Fact Sheet for Patients:  BloggerCourse.com  Fact Sheet for Healthcare Providers:  SeriousBroker.it  This test is no t yet approved or cleared by the [place]  FDA and  has been authorized for detection and/or diagnosis of SARS-CoV-2 by FDA under an Emergency Use Authorization (EUA). This EUA will remain  in effect (meaning this test can be used) for the duration of the COVID-19 declaration under Section 564(b)(1) of the Act, 21 U.S.C.section 360bbb-3(b)(1), unless the authorization is terminated  or revoked sooner.       Influenza A by PCR NEGATIVE NEGATIVE Final   Influenza B by PCR NEGATIVE NEGATIVE Final    Comment: (NOTE) The Xpert Xpress SARS-CoV-2/FLU/RSV plus assay is intended as an aid in the diagnosis of influenza from Nasopharyngeal swab specimens and should not be used as a sole basis for treatment. Nasal washings and aspirates are unacceptable for Xpert Xpress SARS-CoV-2/FLU/RSV testing.  Fact Sheet for Patients: BloggerCourse.com  Fact Sheet for Healthcare Providers: SeriousBroker.it  This test is not yet approved or cleared by the  [place]  FDA and has been authorized for detection and/or diagnosis of SARS-CoV-2 by FDA under an Emergency Use Authorization (EUA). This EUA will remain in effect (meaning this test can be used) for the duration of the COVID-19 declaration under Section 564(b)(1) of the Act, 21 U.S.C. section 360bbb-3(b)(1), unless the authorization is terminated or revoked.     Resp Syncytial Virus by PCR NEGATIVE NEGATIVE Final    Comment: (NOTE) Fact Sheet for Patients: BloggerCourse.com  Fact Sheet for Healthcare Providers: SeriousBroker.it  This test is not yet approved or cleared by the [place]  FDA and has been authorized for detection and/or diagnosis of SARS-CoV-2 by FDA under an Emergency Use Authorization (EUA). This EUA will remain in effect (meaning this test can be used) for the duration of the COVID-19 declaration under Section 564(b)(1) of the Act, 21 U.S.C. section 360bbb-3(b)(1), unless the authorization is terminated or revoked.  Performed at [facility], [address] [place], [place] [address]   Respiratory (~20 pathogens) panel by PCR  Status: None   Collection Time: 08/06/24  9:10 PM   Specimen: Nasopharyngeal Swab; Respiratory  Result Value Ref Range Status   Adenovirus NOT DETECTED NOT DETECTED Final   Coronavirus 229E NOT DETECTED NOT DETECTED Final    Comment: (NOTE) The Coronavirus on the Respiratory Panel, DOES NOT test for the novel  Coronavirus (2019 nCoV)    Coronavirus HKU1 NOT DETECTED NOT DETECTED Final   Coronavirus NL63 NOT DETECTED NOT DETECTED Final   Coronavirus OC43 NOT DETECTED NOT DETECTED Final   Metapneumovirus NOT DETECTED NOT DETECTED Final   Rhinovirus / Enterovirus NOT DETECTED NOT DETECTED Final   Influenza A NOT DETECTED NOT DETECTED Final   Influenza B NOT DETECTED NOT DETECTED Final   Parainfluenza Virus 1 NOT DETECTED NOT DETECTED Final   Parainfluenza Virus 2  NOT DETECTED NOT DETECTED Final   Parainfluenza Virus 3 NOT DETECTED NOT DETECTED Final   Parainfluenza Virus 4 NOT DETECTED NOT DETECTED Final   Respiratory Syncytial Virus NOT DETECTED NOT DETECTED Final   Bordetella pertussis NOT DETECTED NOT DETECTED Final   Bordetella Parapertussis NOT DETECTED NOT DETECTED Final   Chlamydophila pneumoniae NOT DETECTED NOT DETECTED Final   Mycoplasma pneumoniae NOT DETECTED NOT DETECTED Final    Comment: Performed at [facility], [address], [place], [place] [address]     Total time spend on discharging this patient, including the last patient exam, discussing the hospital stay, instructions for ongoing care as it relates to all pertinent caregivers, as well as preparing the medical discharge records, prescriptions, and/or referrals as applicable, is 50 minutes.    Ellouise Haber, MD  [facility] 08/08/2024, 10:22 AM

## 2024-08-08 NOTE — Progress Notes (Signed)
 IV removed. Discharge education completed. Family and mobility staff assisted patient to get dressed. Patient still exhibiting shortness of air upon exertion. MD aware. Patient educated on the importance of continuing the incentive spirometer and ambulation when he gets home. Patient and family verbalized understanding. Patient being wheeled to the medical mall exit to be discharged to the care of daughter in stable condition.  Quenisha Lovins V Shanel Prazak

## 2024-08-08 NOTE — TOC Transition Note (Signed)
 Transition of Care [facility]) - Discharge Note   Patient Details  Name: Gary Mueller MRN: [id number] Date of Birth: 11-May-1941  [facility]) CM/SW Contact:  Asberry CHRISTELLA Jaksch, RN Phone Number: 08/08/2024, 11:18 AM   Clinical Narrative:     Patient will DC to: Home w/ home health services Anticipated DC date: 08/08/2024 Family notified: Daughter [name] Transport by: Daughter [name]  Per MD patient ready for DC home w/ home health services . RN, patient, and patient's family notified of DC. I spoke with patient and daughter [name] at bedside regarding discharge planning. They are agreeable to home health services. They state they have no preference of agency. Cory with [facility] accepted referral for [facility] PT/OT/RN and has been notified of patient's DC. Patient and daughter state that he has a RW at home and denies need for other DME. Patient's daughter [name] to transport at DC.  TOC signing off.   Final next level of care: Home w Home Health Services Barriers to Discharge: Barriers Resolved   Patient Goals and CMS Choice Patient states their goals for this hospitalization and ongoing recovery are:: Going home          Discharge Placement                       Discharge Plan and Services Additional resources added to the After Visit Summary for                            HH Arranged: PT, OT, RN [facility] Agency: [facility] Date [facility] Agency Contacted: 08/08/24 Time HH Agency Contacted: [phone] Representative spoke with at [facility] Agency: Darleene  Social Drivers of Health (SDOH) Interventions SDOH Screenings   Food Insecurity: No Food Insecurity (08/05/2024)  Housing: Low Risk  (08/05/2024)  Transportation Needs: No Transportation Needs (08/05/2024)  Utilities: Not At Risk (08/05/2024)  Alcohol  Screen: Low Risk  (04/05/2022)  Depression (PHQ2-9): Low Risk  (08/09/2022)  Financial Resource Strain: Low Risk  (04/26/2024)   Received from [facility]  Physical  Activity: Sufficiently Active (08/09/2022)  Social Connections: Moderately Integrated (08/05/2024)  Stress: No Stress Concern Present (04/05/2022)  Tobacco Use: Medium Risk (08/05/2024)     Readmission Risk Interventions     No data to display

## 2024-08-10 LAB — CULTURE, BLOOD (ROUTINE X 2)
Culture: NO GROWTH
Culture: NO GROWTH
Special Requests: ADEQUATE

## 2024-08-11 ENCOUNTER — Encounter: Admitting: Surgery

## 2024-08-11 ENCOUNTER — Encounter

## 2024-08-12 ENCOUNTER — Ambulatory Visit (INDEPENDENT_AMBULATORY_CARE_PROVIDER_SITE_OTHER): Admitting: Urology

## 2024-08-12 ENCOUNTER — Encounter: Payer: Self-pay | Admitting: Urology

## 2024-08-12 ENCOUNTER — Ambulatory Visit: Admitting: Urology

## 2024-08-12 VITALS — BP 145/81 | HR 69 | Ht 72.0 in | Wt 166.1 lb

## 2024-08-12 DIAGNOSIS — N401 Enlarged prostate with lower urinary tract symptoms: Secondary | ICD-10-CM

## 2024-08-12 DIAGNOSIS — R338 Other retention of urine: Secondary | ICD-10-CM

## 2024-08-12 LAB — MICROSCOPIC EXAMINATION

## 2024-08-12 LAB — URINALYSIS, COMPLETE
Bilirubin, UA: NEGATIVE
Glucose, UA: NEGATIVE
Ketones, UA: NEGATIVE
Leukocytes,UA: NEGATIVE
Nitrite, UA: NEGATIVE
Protein,UA: NEGATIVE
RBC, UA: NEGATIVE
Specific Gravity, UA: 1.015 (ref 1.005–1.030)
Urobilinogen, Ur: 1 mg/dL (ref 0.2–1.0)
pH, UA: 6 (ref 5.0–7.5)

## 2024-08-12 LAB — BLADDER SCAN AMB NON-IMAGING

## 2024-08-12 NOTE — Progress Notes (Signed)
 Catheter Removal  Patient is present today for a catheter removal.  10ml of water was drained from the balloon. A 16FR foley cath was removed from the bladder, no complications were noted. Patient tolerated well.  Performed by: Laymon Ned CMA  Follow up/ Additional notes: No follow-ups on file.

## 2024-08-12 NOTE — Addendum Note (Signed)
 Addended by: DEBBY LAYMON HERO on: 08/12/2024 01:36 PM   Modules accepted: Orders

## 2024-08-12 NOTE — Progress Notes (Signed)
   08/12/24  CC:  Chief Complaint  Patient presents with   Cysto    HPI: Refer to Sam Vaillancourt's previous notes 07/15/2024 and 08/04/2024  Blood pressure (!) 145/81, pulse 69, height 6' (1.829 m), weight 166 lb 1.6 oz (75.3 kg). NED. A&Ox3.   No respiratory distress   Abd soft, NT, ND Normal phallus with bilateral descended testicles  Cystoscopy Procedure Note  Patient identification was confirmed, informed consent was obtained, and patient was prepped using Betadine  solution.  Lidocaine  jelly was administered per urethral meatus.     Pre-Procedure: - Inspection reveals a normal caliber urethral meatus.  Procedure: The flexible cystoscope was introduced without difficulty - No urethral strictures/lesions are present. - Moderate lateral lobe enlargement prostate  - Elevated bladder neck - Bilateral ureteral orifices identified - Bladder mucosa erythema bladder base consistent with chronic indwelling Foley - No bladder stones - Moderate trabeculation  Retroflexion shows no intravesical median lobe   Post-Procedure: - Patient tolerated the procedure well  Assessment/ Plan: Desired a repeat voiding trial and catheter was not replaced He will follow-up this afternoon for bladder scan Prostate volume calculated on recent CT was 112 cc Discussed HoLEP if recurrent retention on voiding trial    Glendia JAYSON Barba, MD

## 2024-08-12 NOTE — Progress Notes (Signed)
 Simple Catheter Placement  Due to urinary retention patient is present today for a foley cath placement.  Patient was cleaned and prepped in a sterile fashion with betadine  and 2% lidocaine  jelly was instilled into the urethra. A 16 FR coude foley catheter was inserted, urine return was noted  600+ml, urine was yellow in color.  The balloon was filled with 10cc of sterile water.  A leg bag was attached for drainage. Patient was also given a night bag to take home and was given instruction on how to change from one bag to another.  Patient was given instruction on proper catheter care.  Patient tolerated well, no complications were noted   Performed by: Sherill LATHER

## 2024-08-13 ENCOUNTER — Encounter: Payer: Self-pay | Admitting: Surgery

## 2024-08-13 ENCOUNTER — Ambulatory Visit (INDEPENDENT_AMBULATORY_CARE_PROVIDER_SITE_OTHER): Admitting: Surgery

## 2024-08-13 ENCOUNTER — Ambulatory Visit: Admitting: Physician Assistant

## 2024-08-13 ENCOUNTER — Encounter

## 2024-08-13 VITALS — BP 145/75 | HR 76 | Temp 98.6°F | Ht 72.0 in | Wt 168.2 lb

## 2024-08-13 DIAGNOSIS — N390 Urinary tract infection, site not specified: Secondary | ICD-10-CM

## 2024-08-13 DIAGNOSIS — K4031 Unilateral inguinal hernia, with obstruction, without gangrene, recurrent: Secondary | ICD-10-CM

## 2024-08-13 DIAGNOSIS — Z09 Encounter for follow-up examination after completed treatment for conditions other than malignant neoplasm: Secondary | ICD-10-CM

## 2024-08-13 DIAGNOSIS — R339 Retention of urine, unspecified: Secondary | ICD-10-CM

## 2024-08-13 NOTE — Patient Instructions (Signed)

## 2024-08-13 NOTE — Progress Notes (Signed)
 Outpatient Surgical Follow Up  08/13/2024  Gary Mueller is an 83 y.o. male.   Chief Complaint  Patient presents with   Routine Post Op    Left inguinal hernia repair 07/29/24    HPI: s/p rob IH, has had a lot of issues w urinary retention, he failed again voiding trail and foley was replaced Tolerating PO, no fever Recent hospitalization due to UTI klesiella CT w small periumbilical hernia w/o complicating featres  Past Medical History:  Diagnosis Date   Acute gastric ulcer with hemorrhage    Allergy    Anemia    Anxiety    Aortic atherosclerosis (HCC)    Arthritis    Atrial fibrillation (HCC)    a.) CHA2DS2VASc = 5 (age x 2, CHF, HTN, prior MI/vascular disease) as of 07/25/2024; b.) rate/rhythm maintained on oral metoprolol  succinate; no OAC (does take clopidogrel)   BILATERAL recurrent inguinal hernia    a.) s/p repair RIGHT 02/1996; b.) s/p repair LEFT 03/1996; c.) s/p repair recurrent LEFT 07/2002; d.) planned repair of second recurrence on LEFT 07/2024   BPH (benign prostatic hyperplasia)    CHF (congestive heart failure) (HCC)    CKD stage 3a, GFR 45-59 ml/min (HCC)    Colon polyps    Coronary artery disease    a.) s/p NSTEMI 10/18/2021 with PCI of LCx 10/19/2021 (2.5 x 22 mm Onyx Frontier DES)   Cystoid macular edema of left eye 07/07/2019   Depression    Diverticulosis    DOE (dyspnea on exertion)    Exudative age related macular degeneration (HCC)    GERD (gastroesophageal reflux disease)    HOH (hard of hearing)    Hyperlipidemia    Hypertension    Hypothyroidism    IBS (irritable bowel syndrome)    Ischemic cardiomyopathy    Levoscoliosis of lumbar spine    Long term current use of clopidogrel    NSTEMI (non-ST elevated myocardial infarction) (HCC) 10/18/2021   a.) Troponin peaked at 822 ng/L; b.) LHC 10/19/2021: subtotal occlusion of the LCx (2.5 x 22 mm Onyx Frontier DES)   Pneumonia    Pre-diabetes    RBBB    Right bundle branch block (RBBB)    Skin  cancer, basal cell 10/20/2016   a.) lip, neck   Status post bilateral cataract extraction 2019   Symptomatic PVCs    Tinnitus of both ears     Past Surgical History:  Procedure Laterality Date   ANTERIOR VITRECTOMY Left 11/28/2018   Procedure: ANTERIOR VITRECTOMY;  Surgeon: Ferol Rogue, MD;  Location: [facility] ORS;  Service: Ophthalmology;  Laterality: Left;   CATARACT EXTRACTION W/PHACO Right 10/10/2018   Procedure: CATARACT EXTRACTION PHACO AND INTRAOCULAR LENS PLACEMENT (IOC);  Surgeon: Ferol Rogue, MD;  Location: [facility] OR[facility];  Service: Ophthalmology;  Laterality: Right;  US  00:41 CDE 6.87 Fluid pack lot # [id number]   CATARACT EXTRACTION W/PHACO Left 11/28/2018   Procedure: CATARACT EXTRACTION PHACO AND INTRAOCULAR LENS PLACEMENT (IOC)-LEFT;  Surgeon: Ferol Rogue, MD;  Location: [facility] ORS;  Service: Ophthalmology;  Laterality: Left;  US  00:47.7 CDE 8.92 Fluid Pack lot # [id number]   CHOLECYSTECTOMY  1968   CHONDROPLASTY Right 01/02/2018   Procedure: CHONDROPLASTY;  Surgeon: Mardee Lynwood SQUIBB, MD;  Location: [facility] OR[facility];  Service: Orthopedics;  Laterality: Right;   COLONOSCOPY  2014   COLONOSCOPY N/A 07/13/2024   Procedure: COLONOSCOPY;  Surgeon: Unk Corinn Skiff, MD;  Location: [facility] ENDOSCOPY;  Service: Gastroenterology;  Laterality: N/A;   CORONARY STENT INTERVENTION N/A 10/19/2021  Procedure: CORONARY STENT INTERVENTION;  Surgeon: Ammon Blunt, MD;  Location: [facility] INVASIVE CV LAB;  Service: Cardiovascular;  Laterality: N/A;   ESOPHAGOGASTRODUODENOSCOPY N/A 07/13/2024   Procedure: EGD (ESOPHAGOGASTRODUODENOSCOPY);  Surgeon: Unk Corinn Skiff, MD;  Location: [facility] ENDOSCOPY;  Service: Gastroenterology;  Laterality: N/A;   HERNIA REPAIR Left 04/18/1996   inguinal hernia repair/ Dr Dessa   HERNIA REPAIR Right 02/26/1996   Dr Dessa   HERNIA REPAIR Left 08/07/2002   Dr Dessa   HERNIORRHAPHY, INGUINAL, ROBOT-ASSISTED, LAPAROSCOPIC Left 07/29/2024   Procedure: HERNIORRHAPHY,  INGUINAL, ROBOT-ASSISTED, LAPAROSCOPIC;  Surgeon: Jordis Laneta FALCON, MD;  Location: [facility] OR[facility];  Service: General;  Laterality: Left;   INSERTION OF MESH  07/29/2024   Procedure: INSERTION OF MESH;  Surgeon: Jordis Laneta FALCON, MD;  Location: [facility] ORS;  Service: General;;   JOINT REPLACEMENT     KNEE ARTHROPLASTY Right 04/30/2020   Procedure: COMPUTER ASSISTED TOTAL KNEE ARTHROPLASTY;  Surgeon: Mardee Lynwood SQUIBB, MD;  Location: [facility] OR[facility];  Service: Orthopedics;  Laterality: Right;   KNEE ARTHROSCOPY Right 01/02/2018   Procedure: ARTHROSCOPY KNEE;  Surgeon: Mardee Lynwood SQUIBB, MD;  Location: [facility];  Service: Orthopedics;  Laterality: Right;   KNEE ARTHROSCOPY Left 02/14/2012   partial menisectomy and chondroplasty   KNEE ARTHROSCOPY WITH MEDIAL MENISECTOMY Right 01/02/2018   Procedure: KNEE ARTHROSCOPY WITH MEDIAL MENISECTOMY;  Surgeon: Mardee Lynwood SQUIBB, MD;  Location: [facility];  Service: Orthopedics;  Laterality: Right;   LEFT HEART CATH AND CORONARY ANGIOGRAPHY N/A 10/19/2021   Procedure: LEFT HEART CATH AND CORONARY ANGIOGRAPHY;  Surgeon: Ammon Blunt, MD;  Location: [facility]VAS[facility] CV LAB;  Service: Cardiovascular;  Laterality: N/A;   LEFT HEART CATH AND CORONARY ANGIOGRAPHY Left 10/10/2022   Procedure: LEFT HEART CATH AND CORONARY ANGIOGRAPHY;  Surgeon: Ammon Blunt, MD;  Location: [facility]VASIVE CV LAB;  Service: Cardiovascular;  Laterality: Left;   POLYPECTOMY  07/13/2024   Procedure: POLYPECTOMY, INTESTINE;  Surgeon: Unk Corinn Skiff, MD;  Location: [facility] ENDOSCOPY;  Service: Gastroenterology;;   TONSILLECTOMY     as a child   TOTAL KNEE ARTHROPLASTY Left 03/29/2015   [facility] Dr. Mardee   VASECTOMY      Family History  Problem Relation Age of Onset   Heart disease Mother        A fib   Healthy Sister     Social History:  reports that he quit smoking about 59 years ago. His smoking use included cigarettes. He has never used smokeless tobacco. He reports current alcohol  use. He  reports that he does not use drugs.  Allergies:  Allergies  Allergen Reactions   Codeine     GI intolerance; hallucinations    Statins     Muscle pain   Tamsulosin  Cough    Medications reviewed.    ROS Full ROS performed and is otherwise negative other than what is stated in HPI   BP (!) 145/75   Pulse 76   Temp 98.6 F (37 C) (Oral)   Ht 6' (1.829 m)   Wt 168 lb 3.2 oz (76.3 kg)   SpO2 95%   BMI 22.81 kg/m   Physical Exam NAD Abd: soft, periumbilical incision w small defect, mildly tender w/o peritonitis, inguinal hernia completely repair, no recurrence , no wound infection    Results for orders placed or performed in visit on 08/12/24 (from the past 48 hours)  Urinalysis, Complete     Status: None   Collection Time: 08/12/24  9:29 AM  Result Value Ref Range  Specific Gravity, UA 1.015 1.005 - 1.030   pH, UA 6.0 5.0 - 7.5   Color, UA Yellow Yellow   Appearance Ur Clear Clear   Leukocytes,UA Negative Negative   Protein,UA Negative Negative/Trace   Glucose, UA Negative Negative   Ketones, UA Negative Negative   RBC, UA Negative Negative   Bilirubin, UA Negative Negative   Urobilinogen, Ur 1.0 0.2 - 1.0 mg/dL   Nitrite, UA Negative Negative   Microscopic Examination Comment     Comment: Microscopic follows if indicated.   Microscopic Examination See below:     Comment: Microscopic was indicated and was performed.  Microscopic Examination     Status: None   Collection Time: 08/12/24  9:29 AM   Urine  Result Value Ref Range   WBC, UA 0-5 0 - 5 /hpf   RBC, Urine 0-2 0 - 2 /hpf   Epithelial Cells (non renal) 0-10 0 - 10 /hpf   Bacteria, UA Few None seen/Few   No results found.  Assessment/Plan: U. Retention and UTI is main issues, he is debilitated from infection. I do want him to recover before addressing umbilical hernia  He is in agreement Rtc 4 wks  Laneta Luna, MD [facility] General Surgeon

## 2024-08-18 ENCOUNTER — Encounter

## 2024-08-21 ENCOUNTER — Encounter

## 2024-08-28 ENCOUNTER — Encounter

## 2024-09-01 ENCOUNTER — Ambulatory Visit (INDEPENDENT_AMBULATORY_CARE_PROVIDER_SITE_OTHER): Admitting: Surgery

## 2024-09-01 ENCOUNTER — Encounter

## 2024-09-01 ENCOUNTER — Encounter: Payer: Self-pay | Admitting: Surgery

## 2024-09-01 VITALS — BP 133/72 | HR 74 | Temp 97.6°F | Ht 72.0 in | Wt 166.0 lb

## 2024-09-01 DIAGNOSIS — K4031 Unilateral inguinal hernia, with obstruction, without gangrene, recurrent: Secondary | ICD-10-CM

## 2024-09-01 DIAGNOSIS — Z09 Encounter for follow-up examination after completed treatment for conditions other than malignant neoplasm: Secondary | ICD-10-CM

## 2024-09-01 NOTE — Patient Instructions (Signed)

## 2024-09-01 NOTE — Progress Notes (Signed)
 Outpatient Surgical Follow Up  09/01/2024  Gary Mueller is an 83 y.o. male.   Chief Complaint  Patient presents with   Routine Post Op    HPI: Gary Mueller yo s/p rob IH 5 weeks ago, has had a lot of issues w urinary retention, he failed  voiding trail and has a foley  Tolerating PO, no fever Recent hospitalization due to UTI klesiella CT w small periumbilical hernia w/o complicating features, he does feel bulge on abdominal wall. Urology has planned potential intervention. He did have cough that now is better  Past Medical History:  Diagnosis Date   Acute gastric ulcer with hemorrhage    Allergy    Anemia    Anxiety    Aortic atherosclerosis (HCC)    Arthritis    Atrial fibrillation (HCC)    a.) CHA2DS2VASc = 5 (age x 2, CHF, HTN, prior MI/vascular disease) as of 07/25/2024; b.) rate/rhythm maintained on oral metoprolol  succinate; no OAC (does take clopidogrel)   BILATERAL recurrent inguinal hernia    a.) s/p repair RIGHT 02/1996; b.) s/p repair LEFT 03/1996; c.) s/p repair recurrent LEFT 07/2002; d.) planned repair of second recurrence on LEFT 07/2024   BPH (benign prostatic hyperplasia)    CHF (congestive heart failure) (HCC)    CKD stage 3a, GFR 45-59 ml/min (HCC)    Colon polyps    Coronary artery disease    a.) s/p NSTEMI 10/18/2021 with PCI of LCx 10/19/2021 (2.5 x 22 mm Onyx Frontier DES)   Cystoid macular edema of left eye 07/07/2019   Depression    Diverticulosis    DOE (dyspnea on exertion)    Exudative age related macular degeneration (HCC)    GERD (gastroesophageal reflux disease)    HOH (hard of hearing)    Hyperlipidemia    Hypertension    Hypothyroidism    IBS (irritable bowel syndrome)    Ischemic cardiomyopathy    Levoscoliosis of lumbar spine    Long term current use of clopidogrel    NSTEMI (non-ST elevated myocardial infarction) (HCC) 10/18/2021   a.) Troponin peaked at 822 ng/L; b.) LHC 10/19/2021: subtotal occlusion of the LCx (2.5 x 22 mm Onyx  Frontier DES)   Pneumonia    Pre-diabetes    RBBB    Right bundle branch block (RBBB)    Skin cancer, basal cell 10/20/2016   a.) lip, neck   Status post bilateral cataract extraction 2019   Symptomatic PVCs    Tinnitus of both ears     Past Surgical History:  Procedure Laterality Date   ANTERIOR VITRECTOMY Left 11/28/2018   Procedure: ANTERIOR VITRECTOMY;  Surgeon: Ferol Rogue, MD;  Location: [facility];  Service: Ophthalmology;  Laterality: Left;   CATARACT EXTRACTION W/PHACO Right 10/10/2018   Procedure: CATARACT EXTRACTION PHACO AND INTRAOCULAR LENS PLACEMENT (IOC);  Surgeon: Ferol Rogue, MD;  Location: [facility];  Service: Ophthalmology;  Laterality: Right;  US  00:41 CDE 6.87 Fluid pack lot # [id number]   CATARACT EXTRACTION W/PHACO Left 11/28/2018   Procedure: CATARACT EXTRACTION PHACO AND INTRAOCULAR LENS PLACEMENT (IOC)-LEFT;  Surgeon: Ferol Rogue, MD;  Location: [facility];  Service: Ophthalmology;  Laterality: Left;  US  00:47.7 CDE 8.92 Fluid Pack lot # [id number]   CHOLECYSTECTOMY  1968   CHONDROPLASTY Right 01/02/2018   Procedure: CHONDROPLASTY;  Surgeon: Mardee Lynwood SQUIBB, MD;  Location: [facility];  Service: Orthopedics;  Laterality: Right;   COLONOSCOPY  2014   COLONOSCOPY N/A 07/13/2024   Procedure: COLONOSCOPY;  Surgeon: Unk Cotton  Jess, MD;  Location: [facility] ENDOSCOPY;  Service: Gastroenterology;  Laterality: N/A;   CORONARY STENT INTERVENTION N/A 10/19/2021   Procedure: CORONARY STENT INTERVENTION;  Surgeon: Ammon Blunt, MD;  Location: [facility];  Service: Cardiovascular;  Laterality: N/A;   ESOPHAGOGASTRODUODENOSCOPY N/A 07/13/2024   Procedure: EGD (ESOPHAGOGASTRODUODENOSCOPY);  Surgeon: Unk Corinn Jess, MD;  Location: [facility] ENDOSCOPY;  Service: Gastroenterology;  Laterality: N/A;   HERNIA REPAIR Left 04/18/1996   inguinal hernia repair/ Dr Dessa   HERNIA REPAIR Right 02/26/1996   Dr Dessa   HERNIA REPAIR Left 08/07/2002   Dr Dessa    HERNIORRHAPHY, INGUINAL, ROBOT-ASSISTED, LAPAROSCOPIC Left 07/29/2024   Procedure: HERNIORRHAPHY, INGUINAL, ROBOT-ASSISTED, LAPAROSCOPIC;  Surgeon: Jordis Laneta FALCON, MD;  Location: [facility];  Service: General;  Laterality: Left;   INSERTION OF MESH  07/29/2024   Procedure: INSERTION OF MESH;  Surgeon: Jordis Laneta FALCON, MD;  Location: [facility];  Service: General;;   JOINT REPLACEMENT     KNEE ARTHROPLASTY Right 04/30/2020   Procedure: COMPUTER ASSISTED TOTAL KNEE ARTHROPLASTY;  Surgeon: Mardee Lynwood SQUIBB, MD;  Location: [facility];  Service: Orthopedics;  Laterality: Right;   KNEE ARTHROSCOPY Right 01/02/2018   Procedure: ARTHROSCOPY KNEE;  Surgeon: Mardee Lynwood SQUIBB, MD;  Location: [facility];  Service: Orthopedics;  Laterality: Right;   KNEE ARTHROSCOPY Left 02/14/2012   partial menisectomy and chondroplasty   KNEE ARTHROSCOPY WITH MEDIAL MENISECTOMY Right 01/02/2018   Procedure: KNEE ARTHROSCOPY WITH MEDIAL MENISECTOMY;  Surgeon: Mardee Lynwood SQUIBB, MD;  Location: [facility];  Service: Orthopedics;  Laterality: Right;   LEFT HEART CATH AND CORONARY ANGIOGRAPHY N/A 10/19/2021   Procedure: LEFT HEART CATH AND CORONARY ANGIOGRAPHY;  Surgeon: Ammon Blunt, MD;  Location: [facility];  Service: Cardiovascular;  Laterality: N/A;   LEFT HEART CATH AND CORONARY ANGIOGRAPHY Left 10/10/2022   Procedure: LEFT HEART CATH AND CORONARY ANGIOGRAPHY;  Surgeon: Ammon Blunt, MD;  Location: [facility];  Service: Cardiovascular;  Laterality: Left;   POLYPECTOMY  07/13/2024   Procedure: POLYPECTOMY, INTESTINE;  Surgeon: Unk Corinn Jess, MD;  Location: [facility] ENDOSCOPY;  Service: Gastroenterology;;   TONSILLECTOMY     as a child   TOTAL KNEE ARTHROPLASTY Left 03/29/2015   [facility] Dr. Mardee   VASECTOMY      Family History  Problem Relation Age of Onset   Heart disease Mother        A fib   Healthy Sister     Social History:  reports that he quit smoking about 59 years ago. His smoking use  included cigarettes. He has never used smokeless tobacco. He reports current alcohol  use. He reports that he does not use drugs.  Allergies:  Allergies  Allergen Reactions   Codeine     GI intolerance; hallucinations    Statins     Muscle pain   Tamsulosin  Cough    Medications reviewed.    ROS Full ROS performed and is otherwise negative other than what is stated in HPI   BP 133/72   Pulse 74   Temp 97.6 F (36.4 C) (Oral)   Ht 6' (1.829 m)   Wt 166 lb (75.3 kg)   SpO2 97%   BMI 22.51 kg/m   Physical Exam  Physical Exam NAD Abd: soft, periumbilical incision w 3 cm defect, mildly tender w/o peritonitis, inguinal hernia completely repair, no recurrence , no wound infection  Assessment/Plan: 83 yo male w ventral hernia that is symptomatic, We will need to fix it at some  point in time but not urgently. He wishes to develop a plan w  Urology and will call us  back when he is ready for ventral H repair    Laneta Luna, MD [facility] General Surgeon

## 2024-09-03 ENCOUNTER — Ambulatory Visit (INDEPENDENT_AMBULATORY_CARE_PROVIDER_SITE_OTHER): Admitting: Urology

## 2024-09-03 ENCOUNTER — Other Ambulatory Visit: Payer: Self-pay

## 2024-09-03 VITALS — BP 120/71 | HR 74 | Wt 165.7 lb

## 2024-09-03 DIAGNOSIS — N401 Enlarged prostate with lower urinary tract symptoms: Secondary | ICD-10-CM | POA: Diagnosis not present

## 2024-09-03 DIAGNOSIS — N138 Other obstructive and reflux uropathy: Secondary | ICD-10-CM

## 2024-09-03 NOTE — Progress Notes (Signed)
 Surgical Physician Order Form [facility]  Dr. Redell Burnet, MD  * Scheduling expectation : Next Available  *Length of Case: 1.5 hours  *Clearance needed: no  *Anticoagulation Instructions: Hold all anticoagulants  *Aspirin  Instructions: Hold Aspirin   *Post-op visit Date/Instructions:  1-3 day cath removal  *Diagnosis: BPH w/urinary obstruction  *Procedure:  HOLEP (47350)   Additional orders: N/A  -Admit type: OUTpatient  -Anesthesia: General  -VTE Prophylaxis Standing Order SCD's       Other:   -Standing Lab Orders Per Anesthesia    Lab other: UA&Urine Culture (appointment already scheduled for 1 week from now for Foley change and preop culture)  -Standing Test orders EKG/Chest x-ray per Anesthesia       Test other:   - Medications:  Cipro 400mg  IV  -Other orders:  N/A

## 2024-09-03 NOTE — Patient Instructions (Signed)

## 2024-09-03 NOTE — Progress Notes (Signed)
   09/03/2024 11:06 AM   [name] 12-24-1941 [id number]  Reason for visit: Follow up BPH and retention, discuss HOLEP  History: Initially hospitalized July 2025 with lower abdominal pain and found to be in retention with greater than 2 L urine output and Foley placed Failed multiple voiding trials since that time Prostate measures 70 g on recent CT Baseline urinary symptoms were urgency/frequency August 2025 hernia repair with Dr. Jordis and part of bladder was removed from inguinal hernia Recent cystoscopy with Dr. Twylla showed lateral lobe hypertrophy but otherwise benign  Physical Exam: BP 120/71 (BP Location: Left Arm, Patient Position: Sitting, Cuff Size: Normal)   Pulse 74   Wt 165 lb 11.2 oz (75.2 kg)   SpO2 97%   BMI 22.47 kg/m   Imaging/labs: I personally viewed and interpreted the most recent CT scan from 08/05/2024 showing bladder decompressed with Foley, no evidence of residual bladder within hernia, and prostate measured 70g  Today: Here with daughter who helps provide some of the history, he is interested in resuming spontaneous voiding  Plan:   83 year old male with Foley dependent urinary retention, 70 g prostate, failed multiple voiding trials and interested in HOLEP for definitive treatment.  We discussed the risks and benefits of HoLEP at length.  The procedure requires general anesthesia and takes 1 to 2 hours, and a holmium laser is used to enucleate the prostate and push this tissue into the bladder.  A morcellator is then used to remove this tissue, which is sent for pathology.  The vast majority(>95%) of patients are able to discharge the same day with a catheter in place for 2 to 3 days, and will follow-up in clinic for a voiding trial.  We specifically discussed the risks of bleeding, infection, retrograde ejaculation, temporary urgency and urge incontinence, very low risk of long-term incontinence, urethral stricture/bladder neck contracture,  pathologic evaluation of prostate tissue and possible detection of prostate cancer or other malignancy, and possible need for additional procedures.  Schedule HOLEP(70g) RTC 1 week Foley change and preop urine culture  Redell JAYSON Burnet, MD  [facility] [address], [address] [place], [place] [address] [phone]

## 2024-09-04 ENCOUNTER — Encounter

## 2024-09-08 ENCOUNTER — Encounter

## 2024-09-09 ENCOUNTER — Other Ambulatory Visit: Admitting: Urology

## 2024-09-10 ENCOUNTER — Ambulatory Visit: Admitting: Urology

## 2024-09-10 ENCOUNTER — Ambulatory Visit (INDEPENDENT_AMBULATORY_CARE_PROVIDER_SITE_OTHER): Admitting: Physician Assistant

## 2024-09-10 ENCOUNTER — Other Ambulatory Visit: Payer: Self-pay

## 2024-09-10 VITALS — BP 118/69 | HR 69 | Ht 72.0 in | Wt 166.7 lb

## 2024-09-10 DIAGNOSIS — N138 Other obstructive and reflux uropathy: Secondary | ICD-10-CM

## 2024-09-10 DIAGNOSIS — N401 Enlarged prostate with lower urinary tract symptoms: Secondary | ICD-10-CM | POA: Diagnosis not present

## 2024-09-10 DIAGNOSIS — R338 Other retention of urine: Secondary | ICD-10-CM

## 2024-09-10 NOTE — Progress Notes (Signed)
 Cath Change/ Replacement  Patient is present today for a catheter change due to urinary retention.  8ml of water was removed from the balloon, a 16FR coude foley cath was removed without difficulty.  Patient was cleaned and prepped in a sterile fashion with betadine  and 2% lidocaine  jelly was instilled into the urethra. A 16 FR coude foley cath was replaced into the bladder, no complications were noted. Urine return was noted 10ml and urine was yellow in color. The balloon was filled with 10ml of sterile water. A leg bag was attached for drainage.  Patient tolerated well.    Performed by: [name] Sax, PA-C  Additional notes: HOLEP scheduled 11/3. Will get preop UA/CX at next visit. Will update Dr. Jordis on plan given incisional hernia.  Follow up: Return in about 4 weeks (around 10/08/2024) for Catheter exchange + preop UA/CX.

## 2024-09-11 ENCOUNTER — Encounter

## 2024-09-11 ENCOUNTER — Telehealth: Payer: Self-pay

## 2024-09-11 NOTE — Telephone Encounter (Signed)
 Per Dr. Francisca, Patient is to be scheduled for Holmium Laser Enucleation of the Prostate   [name] was contacted and possible surgical dates were discussed, Monday November 3rd, 2025 was agreed upon for surgery.   Patient was instructed that Dr. Francisca will require them to provide a pre-op UA & CX prior to surgery. This was ordered and scheduled drop off appointment was made for 10/09/2024.    Patient was directed to call [phone] between 1-3pm the day before surgery to find out surgical arrival time.  Instructions were given not to eat or drink from midnight on the night before surgery and have a driver for the day of surgery. On the surgery day patient was instructed to enter through the [facility] entrance of [facility] report the Same Day Surgery desk.   Pre-Admit Testing will be in contact via phone to set up an interview with the anesthesia team to review your history and medications prior to surgery.   Reminder of this information was sent via MyChart to the patient.

## 2024-09-11 NOTE — Progress Notes (Signed)
    [facility] Surgical Posting Form  Surgery Date: Date: 10/27/2024  Surgeon: Dr. Redell Burnet, MD  Inpt ( No  )   Outpt (Yes)   Obs ( No  )   Diagnosis: N40.1, N13.8 Benign Prostatic Hyperplasia with Urinary Obstruction   -CPT: 47350  Surgery: Holmium Laser Enucleation of the Prostate  Stop Anticoagulations: Yes and also hold ASA  Cardiac/Medical/Pulmonary Clearance needed: no  *Orders entered into EPIC  Date: 09/11/24   *Case booked in [place]  Date: 09/03/2024  *Notified pt of Surgery: Date: 09/03/2024  PRE-OP UA & CX: Yes, will obtain in clinic on 10/09/2024  *Placed into Prior Authorization Work [place] Date: 09/11/24  Assistant/laser/rep:No

## 2024-09-15 ENCOUNTER — Ambulatory Visit (INDEPENDENT_AMBULATORY_CARE_PROVIDER_SITE_OTHER): Admitting: Surgery

## 2024-09-15 ENCOUNTER — Encounter

## 2024-09-15 ENCOUNTER — Encounter: Payer: Self-pay | Admitting: Surgery

## 2024-09-15 VITALS — BP 145/71 | HR 65 | Temp 98.3°F | Ht 72.0 in | Wt 167.0 lb

## 2024-09-15 DIAGNOSIS — K439 Ventral hernia without obstruction or gangrene: Secondary | ICD-10-CM | POA: Diagnosis not present

## 2024-09-15 NOTE — Progress Notes (Signed)
 Outpatient Surgical Follow Up  09/15/2024  Gary Mueller is an 83 y.o. male.   Chief Complaint  Patient presents with   Routine Post Op    hernia    HPI: Gary Mueller is a very pleasant 83 yo s/p rob IH 8 weeks ago, has had a lot of issues w urinary retention, he failed  voiding trail and has a foley  Tolerating PO, no fever Recent hospitalization due to UTI klesiella CT w small periumbilical hernia w/o complicating features, he does feel bulge on abdominal wall. Urology has planned potential intervention. He did have cough that now is better  Past Medical History:  Diagnosis Date   Acute gastric ulcer with hemorrhage    Allergy    Anemia    Anxiety    Aortic atherosclerosis (HCC)    Arthritis    Atrial fibrillation (HCC)    a.) CHA2DS2VASc = 5 (age x 2, CHF, HTN, prior MI/vascular disease) as of 07/25/2024; b.) rate/rhythm maintained on oral metoprolol  succinate; no OAC (does take clopidogrel)   BILATERAL recurrent inguinal hernia    a.) s/p repair RIGHT 02/1996; b.) s/p repair LEFT 03/1996; c.) s/p repair recurrent LEFT 07/2002; d.) planned repair of second recurrence on LEFT 07/2024   BPH (benign prostatic hyperplasia)    CHF (congestive heart failure) (HCC)    CKD stage 3a, GFR 45-59 ml/min (HCC)    Colon polyps    Coronary artery disease    a.) s/p NSTEMI 10/18/2021 with PCI of LCx 10/19/2021 (2.5 x 22 mm Onyx Frontier DES)   Cystoid macular edema of left eye 07/07/2019   Depression    Diverticulosis    DOE (dyspnea on exertion)    Exudative age related macular degeneration (HCC)    GERD (gastroesophageal reflux disease)    HOH (hard of hearing)    Hyperlipidemia    Hypertension    Hypothyroidism    IBS (irritable bowel syndrome)    Ischemic cardiomyopathy    Levoscoliosis of lumbar spine    Long term current use of clopidogrel    NSTEMI (non-ST elevated myocardial infarction) (HCC) 10/18/2021   a.) Troponin peaked at 822 ng/L; b.) LHC 10/19/2021: subtotal occlusion of  the LCx (2.5 x 22 mm Onyx Frontier DES)   Pneumonia    Pre-diabetes    RBBB    Right bundle branch block (RBBB)    Skin cancer, basal cell 10/20/2016   a.) lip, neck   Status post bilateral cataract extraction 2019   Symptomatic PVCs    Tinnitus of both ears     Past Surgical History:  Procedure Laterality Date   ANTERIOR VITRECTOMY Left 11/28/2018   Procedure: ANTERIOR VITRECTOMY;  Surgeon: Ferol Rogue, MD;  Location: [facility];  Service: Ophthalmology;  Laterality: Left;   CATARACT EXTRACTION W/PHACO Right 10/10/2018   Procedure: CATARACT EXTRACTION PHACO AND INTRAOCULAR LENS PLACEMENT (IOC);  Surgeon: Ferol Rogue, MD;  Location: [facility];  Service: Ophthalmology;  Laterality: Right;  US  00:41 CDE 6.87 Fluid pack lot # [id number]   CATARACT EXTRACTION W/PHACO Left 11/28/2018   Procedure: CATARACT EXTRACTION PHACO AND INTRAOCULAR LENS PLACEMENT (IOC)-LEFT;  Surgeon: Ferol Rogue, MD;  Location: [facility];  Service: Ophthalmology;  Laterality: Left;  US  00:47.7 CDE 8.92 Fluid Pack lot # [id number]   CHOLECYSTECTOMY  1968   CHONDROPLASTY Right 01/02/2018   Procedure: CHONDROPLASTY;  Surgeon: Mardee Lynwood SQUIBB, MD;  Location: [facility];  Service: Orthopedics;  Laterality: Right;   COLONOSCOPY  2014   COLONOSCOPY N/A 07/13/2024  Procedure: COLONOSCOPY;  Surgeon: Unk Corinn Skiff, MD;  Location: [facility] ENDOSCOPY;  Service: Gastroenterology;  Laterality: N/A;   CORONARY STENT INTERVENTION N/A 10/19/2021   Procedure: CORONARY STENT INTERVENTION;  Surgeon: Ammon Blunt, MD;  Location: [facility];  Service: Cardiovascular;  Laterality: N/A;   ESOPHAGOGASTRODUODENOSCOPY N/A 07/13/2024   Procedure: EGD (ESOPHAGOGASTRODUODENOSCOPY);  Surgeon: Unk Corinn Skiff, MD;  Location: [facility] ENDOSCOPY;  Service: Gastroenterology;  Laterality: N/A;   HERNIA REPAIR Left 04/18/1996   inguinal hernia repair/ Dr Dessa   HERNIA REPAIR Right 02/26/1996   Dr Dessa   HERNIA REPAIR Left  08/07/2002   Dr Dessa   HERNIORRHAPHY, INGUINAL, ROBOT-ASSISTED, LAPAROSCOPIC Left 07/29/2024   Procedure: HERNIORRHAPHY, INGUINAL, ROBOT-ASSISTED, LAPAROSCOPIC;  Surgeon: Jordis Laneta FALCON, MD;  Location: [facility];  Service: General;  Laterality: Left;   INSERTION OF MESH  07/29/2024   Procedure: INSERTION OF MESH;  Surgeon: Jordis Laneta FALCON, MD;  Location: [facility];  Service: General;;   JOINT REPLACEMENT     KNEE ARTHROPLASTY Right 04/30/2020   Procedure: COMPUTER ASSISTED TOTAL KNEE ARTHROPLASTY;  Surgeon: Mardee Lynwood SQUIBB, MD;  Location: [facility];  Service: Orthopedics;  Laterality: Right;   KNEE ARTHROSCOPY Right 01/02/2018   Procedure: ARTHROSCOPY KNEE;  Surgeon: Mardee Lynwood SQUIBB, MD;  Location: [facility];  Service: Orthopedics;  Laterality: Right;   KNEE ARTHROSCOPY Left 02/14/2012   partial menisectomy and chondroplasty   KNEE ARTHROSCOPY WITH MEDIAL MENISECTOMY Right 01/02/2018   Procedure: KNEE ARTHROSCOPY WITH MEDIAL MENISECTOMY;  Surgeon: Mardee Lynwood SQUIBB, MD;  Location: [facility];  Service: Orthopedics;  Laterality: Right;   LEFT HEART CATH AND CORONARY ANGIOGRAPHY N/A 10/19/2021   Procedure: LEFT HEART CATH AND CORONARY ANGIOGRAPHY;  Surgeon: Ammon Blunt, MD;  Location: [facility];  Service: Cardiovascular;  Laterality: N/A;   LEFT HEART CATH AND CORONARY ANGIOGRAPHY Left 10/10/2022   Procedure: LEFT HEART CATH AND CORONARY ANGIOGRAPHY;  Surgeon: Ammon Blunt, MD;  Location: [facility];  Service: Cardiovascular;  Laterality: Left;   POLYPECTOMY  07/13/2024   Procedure: POLYPECTOMY, INTESTINE;  Surgeon: Unk Corinn Skiff, MD;  Location: [facility] ENDOSCOPY;  Service: Gastroenterology;;   TONSILLECTOMY     as a child   TOTAL KNEE ARTHROPLASTY Left 03/29/2015   [facility] Dr. Mardee   VASECTOMY      Family History  Problem Relation Age of Onset   Heart disease Mother        A fib   Healthy Sister     Social History:  reports that he quit smoking about 59  years ago. His smoking use included cigarettes. He has never used smokeless tobacco. He reports current alcohol  use. He reports that he does not use drugs.  Allergies:  Allergies  Allergen Reactions   Codeine     GI intolerance; hallucinations    Statins     Muscle pain   Tamsulosin  Cough    Medications reviewed.    ROS Full ROS performed and is otherwise negative other than what is stated in HPI   BP (!) 145/71   Pulse 65   Temp 98.3 F (36.8 C) (Oral)   Ht 6' (1.829 m)   Wt 167 lb (75.8 kg)   SpO2 98%   BMI 22.65 kg/m   Physical Exam Vitals and nursing note reviewed. Exam conducted with a chaperone present.  Constitutional:      General: He is not in acute distress.    Appearance: Normal appearance. He is normal weight.  Cardiovascular:  Rate and Rhythm: Normal rate.     Heart sounds: No murmur heard.    No friction rub.  Pulmonary:     Effort: Pulmonary effort is normal.     Breath sounds: No stridor.  Abdominal:     General: Abdomen is flat. There is no distension.     Palpations: Abdomen is soft. There is no mass.     Tenderness: There is no right CVA tenderness, guarding or rebound.     Hernia: A hernia is present.     Comments: Reducible ventral hernia 3 cms  Musculoskeletal:     Cervical back: Normal range of motion and neck supple.  Skin:    General: Skin is warm and dry.     Capillary Refill: Capillary refill takes less than 2 seconds.  Neurological:     General: No focal deficit present.     Mental Status: He is alert and oriented to person, place, and time.  Psychiatric:        Mood and Affect: Mood normal.        Behavior: Behavior normal.        Thought Content: Thought content normal.        Judgment: Judgment normal.      Assessment/Plan: 83 yo with symptomatic ventral hernia, he looks much better, and has clinically improved. I do think he is optimized as compared to last time. He wishes to have hernia repair next week. Procedure  d/w the pt in detail R, B and possible complications including but not limited to bleeding, infection, pain, bowel injuries. He understands and agrees.  I personally spent a total of 30 minutes in the care of the patient today including performing a medically appropriate exam/evaluation, counseling and educating, placing orders, referring and communicating with other health care professionals, documenting clinical information in the EHR, independently interpreting and reviewing images studies and coordinating care.   Laneta Luna, MD [facility] General Surgeon

## 2024-09-15 NOTE — Patient Instructions (Signed)
 You have requested to have a Ventral Hernia Repair. This will be done by Dr Dana Duncan at [facility]. Please see your (BLUE) Pre-care sheet for more information. Our surgery scheduler will call you to look at surgery dates and to go over surgery information.   If you are on any injectable weight loss medication, you will need to stop taking your GLP-1 injectable (weight loss) medications 8 days before your surgery to avoid any complications with anesthesia.   You will need to arrange to be out of work for approximately 1-2 weeks and then you may return with a lifting restriction for 4 more weeks. If you have FMLA or Disability paperwork that needs to be filled out, please have your company fax your paperwork to [phone] or you may drop this by either office. This paperwork will be filled out within 3 days after your surgery has been completed.     Ventral Hernia A ventral hernia (also called an incisional hernia) is a hernia that occurs at the site of a previous surgical cut (incision) in the abdomen. The abdominal wall spans from your lower chest down to your pelvis. If the abdominal wall is weakened from a surgical incision, a hernia can occur. A hernia is a bulge of bowel or muscle tissue pushing out on the weakened part of the abdominal wall. Ventral hernias can get bigger from straining or lifting. Obese and older people are at higher risk for a ventral hernia. People who develop infections after surgery or require repeat incisions at the same site on the abdomen are also at increased risk. CAUSES  A ventral hernia occurs because of weakness in the abdominal wall at an incision site.  SYMPTOMS  Common symptoms include: A visible bulge or lump on the abdominal wall. Pain or tenderness around the lump. Increased discomfort if you cough or make a sudden movement. If the hernia has blocked part of the intestine, a serious complication can occur (incarcerated or strangulated hernia). This can become a  problem that requires emergency surgery because the blood flow to the blocked intestine may be cut off. Symptoms may include: Feeling sick to your stomach (nauseous). Throwing up (vomiting). Stomach swelling (distention) or bloating. Fever. Rapid heartbeat. DIAGNOSIS  Your health care provider will take a medical history and perform a physical exam. Various tests may be ordered, such as: Blood tests. Urine tests. Ultrasonography. X-rays. Computed tomography (CT). TREATMENT  Watchful waiting may be all that is needed for a smaller hernia that does not cause symptoms. Your health care provider may recommend the use of a supportive belt (truss) that helps to keep the abdominal wall intact. For larger hernias or those that cause pain, surgery to repair the hernia is usually recommended. If a hernia becomes strangulated, emergency surgery needs to be done right away. HOME CARE INSTRUCTIONS Avoid putting pressure or strain on the abdominal area. Avoid heavy lifting. Use good body positioning for physical tasks. Ask your health care provider about proper body positioning. Use a supportive belt as directed by your health care provider. Maintain a healthy weight. Eat foods that are high in fiber, such as whole grains, fruits, and vegetables. Fiber helps prevent difficult bowel movements (constipation). Drink enough fluids to keep your urine clear or pale yellow. Follow up with your health care provider as directed. SEEK MEDICAL CARE IF:  Your hernia seems to be getting larger or more painful. SEEK IMMEDIATE MEDICAL CARE IF:  You have abdominal pain that is sudden and sharp. Your pain  becomes severe. You have repeated vomiting. You are sweating a lot. You notice a rapid heartbeat. You develop a fever. MAKE SURE YOU:  Understand these instructions. Will watch your condition. Will get help right away if you are not doing well or get worse.     Open Ventral Hernia Repair Open ventral  hernia repair is a surgery to fix a ventral hernia. A ventral hernia,  is a bulge of body tissue or intestines that pushes through the front part of the abdomen. This can happen if the connective tissue covering the muscles over the abdomen has a weak spot or is torn because of a surgical cut (incision) from a previous surgery. A ventral hernia repair is often done soon after diagnosis to stop the hernia from getting bigger, becoming uncomfortable, or becoming an emergency. This surgery usually takes about 2 hours, but the time can vary greatly.  LET [facility] CARE PROVIDER KNOW ABOUT: Any allergies you have. All medicines you are taking, including steroids, vitamins, herbs, eye drops, creams, and over-the-counter medicines. Previous problems you or members of your family have had with the use of anesthetics. Any blood disorders you have. Previous surgeries you have had. Medical conditions you have.  RISKS AND COMPLICATIONS  Generally, Open ventral hernia repair is a safe procedure. However, as with any surgical procedure, problems can occur. Possible problems include: Bleeding. Trouble passing urine or having a bowel movement after the surgery. Infection. Pneumonia. Blood clots. Pain in the area of the hernia. A bulge in the area of the hernia that may be caused by a collection of fluid. Injury to intestines or other structures in the abdomen. Return of the hernia after surgery.  BEFORE THE PROCEDURE  You may need to have blood tests, urine tests, a chest X-ray, or an electrocardiogram done before the day of the surgery. Ask your health care provider about changing or stopping your regular medicines. This is especially important if you are taking diabetes medicines or blood thinners. You may need to wash with a special type of germ-killing soap. Do not eat or drink anything after midnight the night before the procedure or as directed by your health care provider. Make plans to have  someone drive you home after the procedure.  PROCEDURE  Small monitors will be put on your body. They are used to check your heart, blood pressure, and oxygen  level. An IV access tube will be put into a vein in your hand or arm. Fluids and medicine will flow directly into your body through the IV tube. You will be given medicine that makes you go to sleep (general anesthetic). Your abdomen will be cleaned with a special soap to kill any germs on your skin. Once you are asleep, a moderate - large size incision will be made in your abdomen. The size of incision depends on how large your hernia is. Your surgeon puts the tissue or intestines that formed the hernia back in place. A screen-like patch (mesh) is used to close the hernia. This helps make the area stronger. Stitches, tacks, or staples are used to keep the mesh in place. Medicine and a bandage (dressing) or skin glue will be put over the incision.  AFTER THE PROCEDURE  You will stay in a recovery area until the anesthetic wears off. Your blood pressure and pulse will be checked often. You may be able to go home the same day or may need to stay in the hospital for 1-2 days after surgery. Your  surgeon will decide when you can go home depending upon your recovery. You may feel some pain. You will be given medicine for pain. You will be urged to do breathing exercises that involve taking deep breaths. This helps prevent a lung infection after a surgery. You may have to wear compression stockings while you are in the hospital. These stockings help keep blood clots from forming in your legs.   This information is not intended to replace advice given to you by your health care provider. Make sure you discuss any questions you have with your health care provider.   Document Released: 11/27/2012 Document Revised: 12/16/2013 Document Reviewed: 11/27/2012 Elsevier Interactive Patient Education [employer]

## 2024-09-15 NOTE — H&P (View-Only) (Signed)
 Outpatient Surgical Follow Up  09/15/2024  Gary Mueller is an 83 y.o. male.   Chief Complaint  Patient presents with   Routine Post Op    hernia    HPI: Gary Mueller is a very pleasant 83 yo s/p rob IH 8 weeks ago, has had a lot of issues w urinary retention, he failed  voiding trail and has a foley  Tolerating PO, no fever Recent hospitalization due to UTI klesiella CT w small periumbilical hernia w/o complicating features, he does feel bulge on abdominal wall. Urology has planned potential intervention. He did have cough that now is better  Past Medical History:  Diagnosis Date   Acute gastric ulcer with hemorrhage    Allergy    Anemia    Anxiety    Aortic atherosclerosis (HCC)    Arthritis    Atrial fibrillation (HCC)    a.) CHA2DS2VASc = 5 (age x 2, CHF, HTN, prior MI/vascular disease) as of 07/25/2024; b.) rate/rhythm maintained on oral metoprolol  succinate; no OAC (does take clopidogrel)   BILATERAL recurrent inguinal hernia    a.) s/p repair RIGHT 02/1996; b.) s/p repair LEFT 03/1996; c.) s/p repair recurrent LEFT 07/2002; d.) planned repair of second recurrence on LEFT 07/2024   BPH (benign prostatic hyperplasia)    CHF (congestive heart failure) (HCC)    CKD stage 3a, GFR 45-59 ml/min (HCC)    Colon polyps    Coronary artery disease    a.) s/p NSTEMI 10/18/2021 with PCI of LCx 10/19/2021 (2.5 x 22 mm Onyx Frontier DES)   Cystoid macular edema of left eye 07/07/2019   Depression    Diverticulosis    DOE (dyspnea on exertion)    Exudative age related macular degeneration (HCC)    GERD (gastroesophageal reflux disease)    HOH (hard of hearing)    Hyperlipidemia    Hypertension    Hypothyroidism    IBS (irritable bowel syndrome)    Ischemic cardiomyopathy    Levoscoliosis of lumbar spine    Long term current use of clopidogrel    NSTEMI (non-ST elevated myocardial infarction) (HCC) 10/18/2021   a.) Troponin peaked at 822 ng/L; b.) LHC 10/19/2021: subtotal occlusion of  the LCx (2.5 x 22 mm Onyx Frontier DES)   Pneumonia    Pre-diabetes    RBBB    Right bundle branch block (RBBB)    Skin cancer, basal cell 10/20/2016   a.) lip, neck   Status post bilateral cataract extraction 2019   Symptomatic PVCs    Tinnitus of both ears     Past Surgical History:  Procedure Laterality Date   ANTERIOR VITRECTOMY Left 11/28/2018   Procedure: ANTERIOR VITRECTOMY;  Surgeon: Ferol Rogue, MD;  Location: [facility] OR[facility];  Service: Ophthalmology;  Laterality: Left;   CATARACT EXTRACTION W/PHACO Right 10/10/2018   Procedure: CATARACT EXTRACTION PHACO AND INTRAOCULAR LENS PLACEMENT (IOC);  Surgeon: Ferol Rogue, MD;  Location: [facility];  Service: Ophthalmology;  Laterality: Right;  US  00:41 CDE 6.87 Fluid pack lot # [id number]   CATARACT EXTRACTION W/PHACO Left 11/28/2018   Procedure: CATARACT EXTRACTION PHACO AND INTRAOCULAR LENS PLACEMENT (IOC)-LEFT;  Surgeon: Ferol Rogue, MD;  Location: [facility];  Service: Ophthalmology;  Laterality: Left;  US  00:47.7 CDE 8.92 Fluid Pack lot # [id number]   CHOLECYSTECTOMY  1968   CHONDROPLASTY Right 01/02/2018   Procedure: CHONDROPLASTY;  Surgeon: Mardee Lynwood SQUIBB, MD;  Location: [facility];  Service: Orthopedics;  Laterality: Right;   COLONOSCOPY  2014   COLONOSCOPY N/A 07/13/2024  Procedure: COLONOSCOPY;  Surgeon: Unk Corinn Skiff, MD;  Location: [facility]COPY;  Service: Gastroenterology;  Laterality: N/A;   CORONARY STENT INTERVENTION N/A 10/19/2021   Procedure: CORONARY STENT INTERVENTION;  Surgeon: Ammon Blunt, MD;  Location: [facility];  Service: Cardiovascular;  Laterality: N/A;   ESOPHAGOGASTRODUODENOSCOPY N/A 07/13/2024   Procedure: EGD (ESOPHAGOGASTRODUODENOSCOPY);  Surgeon: Unk Corinn Skiff, MD;  Location: [facility];  Service: Gastroenterology;  Laterality: N/A;   HERNIA REPAIR Left 04/18/1996   inguinal hernia repair/ Dr Dessa   HERNIA REPAIR Right 02/26/1996   Dr Dessa   HERNIA REPAIR Left  08/07/2002   Dr Dessa   HERNIORRHAPHY, INGUINAL, ROBOT-ASSISTED, LAPAROSCOPIC Left 07/29/2024   Procedure: HERNIORRHAPHY, INGUINAL, ROBOT-ASSISTED, LAPAROSCOPIC;  Surgeon: Jordis Laneta FALCON, MD;  Location: [facility];  Service: General;  Laterality: Left;   INSERTION OF MESH  07/29/2024   Procedure: INSERTION OF MESH;  Surgeon: Jordis Laneta FALCON, MD;  Location: [facility];  Service: General;;   JOINT REPLACEMENT     KNEE ARTHROPLASTY Right 04/30/2020   Procedure: COMPUTER ASSISTED TOTAL KNEE ARTHROPLASTY;  Surgeon: Mardee Lynwood SQUIBB, MD;  Location: [facility];  Service: Orthopedics;  Laterality: Right;   KNEE ARTHROSCOPY Right 01/02/2018   Procedure: ARTHROSCOPY KNEE;  Surgeon: Mardee Lynwood SQUIBB, MD;  Location: [facility];  Service: Orthopedics;  Laterality: Right;   KNEE ARTHROSCOPY Left 02/14/2012   partial menisectomy and chondroplasty   KNEE ARTHROSCOPY WITH MEDIAL MENISECTOMY Right 01/02/2018   Procedure: KNEE ARTHROSCOPY WITH MEDIAL MENISECTOMY;  Surgeon: Mardee Lynwood SQUIBB, MD;  Location: [facility];  Service: Orthopedics;  Laterality: Right;   LEFT HEART CATH AND CORONARY ANGIOGRAPHY N/A 10/19/2021   Procedure: LEFT HEART CATH AND CORONARY ANGIOGRAPHY;  Surgeon: Ammon Blunt, MD;  Location: [facility];  Service: Cardiovascular;  Laterality: N/A;   LEFT HEART CATH AND CORONARY ANGIOGRAPHY Left 10/10/2022   Procedure: LEFT HEART CATH AND CORONARY ANGIOGRAPHY;  Surgeon: Ammon Blunt, MD;  Location: [facility] CV [facility];  Service: Cardiovascular;  Laterality: Left;   POLYPECTOMY  07/13/2024   Procedure: POLYPECTOMY, INTESTINE;  Surgeon: Unk Corinn Skiff, MD;  Location: [facility];  Service: Gastroenterology;;   TONSILLECTOMY     as a child   TOTAL KNEE ARTHROPLASTY Left 03/29/2015   [facility] Dr. Mardee   VASECTOMY      Family History  Problem Relation Age of Onset   Heart disease Mother        A fib   Healthy Sister     Social History:  reports that he quit smoking about 59  years ago. His smoking use included cigarettes. He has never used smokeless tobacco. He reports current alcohol  use. He reports that he does not use drugs.  Allergies:  Allergies  Allergen Reactions   Codeine     GI intolerance; hallucinations    Statins     Muscle pain   Tamsulosin  Cough    Medications reviewed.    ROS Full ROS performed and is otherwise negative other than what is stated in HPI   BP (!) 145/71   Pulse 65   Temp 98.3 F (36.8 C) (Oral)   Ht 6' (1.829 m)   Wt 167 lb (75.8 kg)   SpO2 98%   BMI 22.65 kg/m   Physical Exam Vitals and nursing note reviewed. Exam conducted with a chaperone present.  Constitutional:      General: He is not in acute distress.    Appearance: Normal appearance. He is normal weight.  Cardiovascular:  Rate and Rhythm: Normal rate.     Heart sounds: No murmur heard.    No friction rub.  Pulmonary:     Effort: Pulmonary effort is normal.     Breath sounds: No stridor.  Abdominal:     General: Abdomen is flat. There is no distension.     Palpations: Abdomen is soft. There is no mass.     Tenderness: There is no right CVA tenderness, guarding or rebound.     Hernia: A hernia is present.     Comments: Reducible ventral hernia 3 cms  Musculoskeletal:     Cervical back: Normal range of motion and neck supple.  Skin:    General: Skin is warm and dry.     Capillary Refill: Capillary refill takes less than 2 seconds.  Neurological:     General: No focal deficit present.     Mental Status: He is alert and oriented to person, place, and time.  Psychiatric:        Mood and Affect: Mood normal.        Behavior: Behavior normal.        Thought Content: Thought content normal.        Judgment: Judgment normal.      Assessment/Plan: 83 yo with symptomatic ventral hernia, he looks much better, and has clinically improved. I do think he is optimized as compared to last time. He wishes to have hernia repair next week. Procedure  d/w the pt in detail R, B and possible complications including but not limited to bleeding, infection, pain, bowel injuries. He understands and agrees.  I personally spent a total of 30 minutes in the care of the patient today including performing a medically appropriate exam/evaluation, counseling and educating, placing orders, referring and communicating with other health care professionals, documenting clinical information in the EHR, independently interpreting and reviewing images studies and coordinating care.   Laneta Luna, MD [facility] General Surgeon

## 2024-09-16 ENCOUNTER — Telehealth: Payer: Self-pay | Admitting: Surgery

## 2024-09-16 NOTE — Telephone Encounter (Signed)
 Patient has been advised of Pre-Admission date/time, and Surgery date at [facility].  Surgery Date: 09/25/24 Preadmission Testing Date: 09/19/24 (phone 8a-1p)  Patient informed of the scheduling process and surgery information given at time of office visit.   Patient has been made aware to call [phone], between 1-3:00pm the day before surgery, to find out what time to arrive for surgery.

## 2024-09-17 ENCOUNTER — Ambulatory Visit: Admitting: Urology

## 2024-09-18 ENCOUNTER — Encounter

## 2024-09-19 ENCOUNTER — Other Ambulatory Visit: Payer: Self-pay

## 2024-09-19 ENCOUNTER — Encounter
Admission: RE | Admit: 2024-09-19 | Discharge: 2024-09-19 | Disposition: A | Discharge status: Home / Self Care | Source: Ambulatory Visit | Attending: Surgery | Admitting: Surgery

## 2024-09-19 DIAGNOSIS — Z01818 Encounter for other preprocedural examination: Secondary | ICD-10-CM

## 2024-09-19 DIAGNOSIS — I214 Non-ST elevation (NSTEMI) myocardial infarction: Secondary | ICD-10-CM

## 2024-09-19 HISTORY — DX: Ventral hernia without obstruction or gangrene: K43.9

## 2024-09-19 NOTE — Patient Instructions (Addendum)
 Your procedure is scheduled on: THURSDAY 09/25/24 Report to the Registration Desk on the 1st floor of the Medical Mall.  To find out your arrival time, please call [phone] between 1PM - 3PM on: [facility] 09/24/24 If your arrival time is 6:00 am, do not arrive before that time as the Medical Mall entrance doors do not open until 6:00 am.  REMEMBER: Instructions that are not followed completely may result in serious medical risk, up to and including death; or upon the discretion of your surgeon and anesthesiologist your surgery may need to be rescheduled.  Do not eat food after midnight the night before surgery.  No gum chewing or hard candies.  You may however, drink CLEAR liquids up to 2 hours before you are scheduled to arrive for your surgery. Do not drink anything within 2 hours of your scheduled arrival time.  Clear liquids include: - water  - apple juice without pulp - gatorade (not RED colors) - black coffee or tea (Do NOT add milk or creamers to the coffee or tea) Do NOT drink anything that is not on this list.  One week prior to surgery: Stop Anti-inflammatories (NSAIDS) such as Advil, Aleve, Ibuprofen, Motrin, Naproxen, Naprosyn and Aspirin  based products such as Excedrin, Goody's Powder, BC Powder.  Stop ANY OVER THE COUNTER supplements until after surgery.   You may however, continue to take Tylenol  if needed for pain up until the day of surgery.  Continue taking all of your other prescription medications up until the day of surgery.  ON THE DAY OF SURGERY ONLY TAKE THESE MEDICATIONS WITH SIPS OF WATER:  levothyroxine  (SYNTHROID )  pantoprazole  (PROTONIX )   Use inhalers on the day of surgery and bring to the hospital.  No Alcohol  for 24 hours before or after surgery.  No Smoking including e-cigarettes for 24 hours before surgery.  No chewable tobacco products for at least 6 hours before surgery.  No nicotine patches on the day of surgery.  Do not use any  recreational drugs for at least a week (preferably 2 weeks) before your surgery.  Please be advised that the combination of cocaine and anesthesia may have negative outcomes, up to and including death. If you test positive for cocaine, your surgery will be cancelled.   On the morning of surgery brush your teeth with toothpaste and water, you may rinse your mouth with mouthwash if you wish. Do not swallow any toothpaste or mouthwash.   Use CHG Soap or wipes as directed on instruction sheet.  Do not wear jewelry, make-up, hairpins, clips or nail polish.  For welded (permanent) jewelry: bracelets, anklets, waist bands, etc.  Please have this removed prior to surgery.  If it is not removed, there is a chance that hospital personnel will need to cut it off on the day of surgery.  Do not wear lotions, powders, or perfumes.   Do not shave body hair from the neck down 48 hours before surgery.  Contact lenses, hearing aids and dentures may not be worn into surgery.  Do not bring valuables to the hospital. [facility] is not responsible for any missing/lost belongings or valuables.   Notify your doctor if there is any change in your medical condition (cold, fever, infection).  Wear comfortable clothing (specific to your surgery type) to the hospital.  After surgery, you can help prevent lung complications by doing breathing exercises.  Take deep breaths and cough every 1-2 hours. Your doctor may order a device called an Facilities manager  to help you take deep breaths. When coughing or sneezing, hold a pillow firmly against your incision with both hands. This is called "splinting." Doing this helps protect your incision. It also decreases belly discomfort.  If you are being discharged the day of surgery, you will not be allowed to drive home.  You will need a responsible individual to drive you home and stay with you for 24 hours after surgery.   If you are taking public transportation, you  will need to have a responsible individual with you.   Please call the Pre-admissions Testing Dept. at [phone] if you have any questions about these instructions.  Surgery Visitation Policy:  Patients having surgery or a procedure may have two visitors.  Children under the age of 86 must have an adult with them who is not the patient.  Merchandiser, retail to address health-related social needs:  [email or web address]                                                                                                             Preparing for Surgery with CHLORHEXIDINE  GLUCONATE (CHG) Soap  Chlorhexidine  Gluconate (CHG) Soap  o An antiseptic cleaner that kills germs and bonds with the skin to continue killing germs even after washing  o Used for showering the night before surgery and morning of surgery  Before surgery, you can play an important role by reducing the number of germs on your skin.  CHG (Chlorhexidine  gluconate) soap is an antiseptic cleanser which kills germs and bonds with the skin to continue killing germs even after washing.  Please do not use if you have an allergy to CHG or antibacterial soaps. If your skin becomes reddened/irritated stop using the CHG.  1. Shower the NIGHT BEFORE SURGERY and the MORNING OF SURGERY with CHG soap.  2. If you choose to wash your hair, wash your hair first as usual with your normal shampoo.  3. After shampooing, rinse your hair and body thoroughly to remove the shampoo.  4. Use CHG as you would any other liquid soap. You can apply CHG directly to the skin and wash gently with a scrungie or a clean washcloth.  5. Apply the CHG soap to your body only from the neck down. Do not use on open wounds or open sores. Avoid contact with your eyes, ears, mouth, and genitals (private parts). Wash face and genitals (private parts) with your normal soap.  6. Wash thoroughly, paying special attention to the area where your  surgery will be performed.  7. Thoroughly rinse your body with warm water.  8. Do not shower/wash with your normal soap after using and rinsing off the CHG soap.  9. Pat yourself dry with a clean towel.  10. Wear clean pajamas to bed the night before surgery.  12. Place clean sheets on your bed the night of your first shower and do not sleep with pets.  13. Shower again with the CHG soap on the day of surgery prior to arriving at the hospital.  14.  Do not apply any deodorants/lotions/powders.  15. Please wear clean clothes to the hospital.

## 2024-09-22 ENCOUNTER — Encounter
Admission: RE | Admit: 2024-09-22 | Discharge: 2024-09-22 | Disposition: A | Discharge status: Home / Self Care | Source: Ambulatory Visit | Attending: Surgery | Admitting: Surgery

## 2024-09-22 ENCOUNTER — Encounter

## 2024-09-22 DIAGNOSIS — I214 Non-ST elevation (NSTEMI) myocardial infarction: Secondary | ICD-10-CM | POA: Insufficient documentation

## 2024-09-22 DIAGNOSIS — Z01818 Encounter for other preprocedural examination: Secondary | ICD-10-CM

## 2024-09-22 DIAGNOSIS — Z01812 Encounter for preprocedural laboratory examination: Secondary | ICD-10-CM | POA: Insufficient documentation

## 2024-09-22 LAB — CBC
HCT: 36.4 % — ABNORMAL LOW (ref 39.0–52.0)
Hemoglobin: 11.4 g/dL — ABNORMAL LOW (ref 13.0–17.0)
MCH: 30.4 pg (ref 26.0–34.0)
MCHC: 31.3 g/dL (ref 30.0–36.0)
MCV: 97.1 fL (ref 80.0–100.0)
Platelets: 178 K/uL (ref 150–400)
RBC: 3.75 MIL/uL — ABNORMAL LOW (ref 4.22–5.81)
RDW: 13.3 % (ref 11.5–15.5)
WBC: 5.8 K/uL (ref 4.0–10.5)
nRBC: 0 % (ref 0.0–0.2)

## 2024-09-23 NOTE — Progress Notes (Signed)
 Perioperative / Anesthesia Services  Pre-Admission Testing Clinical Review / Pre-Operative Anesthesia Consult  Date: 09/24/24  PATIENT DEMOGRAPHICS: Name: Gary Mueller DOB: [birth year] MRN:   [id number]  Note: Available PAT nursing documentation and vital signs have been reviewed. Clinical nursing staff has updated patient's PMH/PSHx, current medication list, and drug allergies/intolerances to ensure complete and comprehensive history available to assist care teams in MDM as it pertains to the aforementioned surgical procedure and anticipated anesthetic course. Extensive review of available clinical information personally performed. [place] PMH and PSHx updated with any diagnoses/procedures that  may have been inadvertently omitted during his intake with the pre-admission testing department's nursing staff.  PLANNED SURGICAL PROCEDURE(S):   Case: [id number] Date/Time: 09/25/24 0943   Procedure: REPAIR, HERNIA, VENTRAL - open proceduren   Anesthesia type: General   Diagnosis: Ventral hernia without obstruction or gangrene [K43.9]   Pre-op diagnosis: ventral hernia non-recurrent, reducible 3 cm   Location: [facility] OR ROOM 07 / [facility]S [facility]   Surgeons: Gary Gary FALCON, MD        CLINICAL DISCUSSION: Gary Mueller is a 83 y.o. male who is submitted for pre-surgical anesthesia review and clearance prior to him undergoing the above procedure. Patient is a Former Smoker (quit 11/1964). Pertinent PMH includes: CAD, NSTEMI, ischemic cardiomyopathy, CHF, atrial fibrillation, symptomatic PVCs, RBBB, aortic atherosclerosis, HTN, HLD, pre-diabetes, hypothyroidism, CKD-III, DOE, GERD (on daily PPI), bleeding gastric ulcer, anemia, ventral hernia, BPH, lumbar levoscoliosis, OA, depression, anxiety.   Patient is followed by cardiology Andrea, MD). He was last seen in the cardiology clinic on 08/04/2024; notes reviewed. At the time of his clinic visit, patient complained of chronic  exertional dyspnea that was reported to be stable and at baseline. Patient denied any chest pain, PND, orthopnea, palpitations, significant peripheral edema, weakness, fatigue, vertiginous symptoms, or presyncope/syncope. Patient with a past medical history significant for cardiovascular diagnoses. Documented physical exam was grossly benign, providing no evidence of acute exacerbation and/or decompensation of the patient's known cardiovascular conditions.  The patient suffered an NSTEMI on 10/18/2021. Troponins were trended and peaked at a 22 ng/L.  Patient underwent diagnostic LEFT heart catheterization on 10/19/2021 that demonstrated a moderately reduced left ventricular systolic function with an EF of 35-45%. Study revealed multivessel CAD; 20% proximal RCA, 30% ostial-mid LM, 100% proximal LCx, 50% proximal LAD, and 40% D1.  PCI was subsequently performed placing a 2.5 x 22 mm Onyx Frontier DES x 1 to the proximal LCx lesion yielding excellent angiographic result and TIMI-3 flow.   Repeat diagnostic LEFT heart catheterization was performed on 07/10/2022 revealing an improved left ventricular systolic function with an EF of 50-55%.  Again, multivessel CAD was noted; 50% proximal LCx, 20% proximal RCA, 40% ostial-mid LM, 60% D1, and 20% mid RCA.  Previously placed stent to the LCx was widely patent.  No further intervention deemed necessary at that time.  Recommendations were for aggressive risk factor modification and medical management of patient's coronary artery disease.   Most recent myocardial perfusion imaging study was performed on 04/01/2024 revealing mildly reduced left ventricular systolic function with an EF of 48%.  There were no regional wall motion abnormalities.  No artifact .left ventricular cavity size was noted to be enlarged appreciated on review of imaging. SPECT images demonstrated a moderate mixed perfusion abnormality of mild intensity in the inferior region on stress images  consistent with scar. TID ratio = 1.10. Study determined to be normal and low risk.   Most recent TTE  performed on 07/15/2024 revealed a normal left ventricular systolic function with an EF of 55-60%. There were no regional wall motion abnormalities. Left ventricular diastolic Doppler parameters consistent with abnormal relaxation (G1DD). Right ventricular size and function normal with a TAPSE measuring 2.8 cm  (normal range >/= 1.6 cm). There was mild mitral and tricuspid valve regurgitation.  All transvalvular gradients were noted to be normal providing no evidence of hemodynamically significant valvular stenosis. Aorta normal in size with no evidence of ectasia or aneurysmal dilatation.  Patient with an atrial fibrillation diagnosis; CHA2DS2-VASc Score = 5 (age x 2 CHF, HTN, prior MI/vascular disease). His rate and rhythm are currently being maintained on oral metoprolol  succinate; not on chronic OAC. Following NSTEMI, patient remains on daily antithrombotic therapy using clopidogrel.  Patient reportedly compliant with therapy with no evidence of reports of GI/GU related bleeding. Blood pressure well controlled at 110/70 mmHg on currently prescribed beta-blocker (metoprolol  succinate) and diuretic (furosemide) therapies.  Patient is on rosuvastatin  for his HLD diagnosis and ASCVD prevention.  Patient has a prediabetes diagnosis that he is managing with diet and lifestyle modifications.  Most recent hemoglobin A1c was 5.7% when checked on 05/20/2024.  Functional capacity somewhat limited by patient's exertional dyspnea.  With that said, he is able to complete all of his ADLs/IADLs without significant cardiovascular limitation.  Per the [facility], patient is able to exceed 4 METS physical activity without experiencing any significant degree of angina/anginal equivalent symptoms. No changes were made to his medication regimen during his visit with cardiology.  Patient scheduled to follow-up with outpatient cardiology  in 3 months or sooner if needed.   Gary Mueller is scheduled for an elective REPAIR, HERNIA, VENTRAL on 09/25/2024 with Dr. [name], MD. Given patient's past medical history significant for cardiovascular diagnoses, presurgical cardiac clearance was sought by the PAT team. Per cardiology, this patient is optimized for surgery and may proceed with the planned procedural course with a LOW risk of significant perioperative cardiovascular complications.  Patient has been prescribed clopidogrel, however due to GI bleeding, medication is on hold.  Patient recently seen by cardiology and  hold.  Was extended.  He is not on any other type of anticoagulation/antithrombotic therapies that need to be held during his perioperative course.  Patient denies previous perioperative complications with anesthesia in the past. In review his EMR, it is noted that patient underwent a general anesthetic course here at [facility] ([facility]) in 07/2024 without documented complications.   MOST RECENT VITAL SIGNS:    09/15/2024    1:15 PM 09/10/2024    9:00 AM 09/03/2024   11:02 AM  Vitals with BMI  Height 6' 0 6' 0   Weight 167 lbs 166 lbs 11 oz 165 lbs 11 oz  BMI 22.64 22.6 22.47  Systolic 145 118 879  Diastolic 71 69 71  Pulse 65 69 74   PROVIDERS/SPECIALISTS: NOTE: Primary physician provider listed below. Patient may have been seen by APP or partner within same practice.   PROVIDER ROLE / SPECIALTY LAST Gary Mueller Gary JULIANNA, MD General Surgery (Surgeon) 09/15/2024  Fernande Ophelia [name], MD Primary Care Provider 08/14/2024  Ammon Blunt, MD Cardiology 08/04/2024  Parris Manna, MD Pulmonary Medicine 05/26/2024  Tobie Solo, MD Rheumatology 05/05/2024   ALLERGIES: Allergies  Allergen Reactions   Codeine     GI intolerance; hallucinations    Statins     Muscle pain   Tamsulosin  Cough  CURRENT HOME MEDICATIONS: No current facility-administered  medications for this encounter.    ascorbic acid  (VITAMIN C) 1000 MG tablet   cyanocobalamin  1000 MCG tablet   levothyroxine  (SYNTHROID ) 75 MCG tablet   metoprolol  succinate (TOPROL -XL) 25 MG 24 hr tablet   Multiple Vitamin (MULTIVITAMIN WITH MINERALS) TABS tablet   pantoprazole  (PROTONIX ) 40 MG tablet   Polyethyl Glycol-Propyl Glycol (SYSTANE) 0.4-0.3 % SOLN   rosuvastatin  (CRESTOR ) 40 MG tablet   tamsulosin  (FLOMAX ) 0.4 MG CAPS capsule   HISTORY: Past Medical History:  Diagnosis Date   Acute gastric ulcer with hemorrhage    Allergy    Anemia    Anxiety    Aortic atherosclerosis    Arthritis    Atrial fibrillation (HCC)    a.) CHA2DS2VASc = 5 (age x 2, CHF, HTN, prior MI/vascular disease) as of 07/25/2024; b.) rate/rhythm maintained on oral metoprolol  succinate; no OAC (does take clopidogrel)   BILATERAL recurrent inguinal hernia    a.) s/p repair RIGHT 02/1996; b.) s/p repair LEFT 03/1996; c.) s/p repair recurrent LEFT 07/2002; d.) planned repair of second recurrence on LEFT 07/2024   BPH (benign prostatic hyperplasia)    CHF (congestive heart failure) (HCC)    CKD stage 3a, GFR 45-59 ml/min (HCC)    Colon polyps    Coronary artery disease    a.) s/p NSTEMI 10/18/2021 with PCI of LCx 10/19/2021 (2.5 x 22 mm Onyx Frontier DES)   Cystoid macular edema of left eye 07/07/2019   Depression    Diverticulosis    DOE (dyspnea on exertion)    Exudative age related macular degeneration (HCC)    GERD (gastroesophageal reflux disease)    HOH (hard of hearing)    Hyperlipidemia    Hypertension    Hypothyroidism    IBS (irritable bowel syndrome)    Ischemic cardiomyopathy    Levoscoliosis of lumbar spine    Long term current use of clopidogrel    NSTEMI (non-ST elevated myocardial infarction) (HCC) 10/18/2021   a.) Troponin peaked at 822 ng/L; b.) LHC 10/19/2021: subtotal occlusion of the LCx (2.5 x 22 mm Onyx Frontier DES)   Pneumonia    Pre-diabetes    RBBB    Right bundle  branch block (RBBB)    Skin cancer, basal cell 10/20/2016   a.) lip, neck   Status post bilateral cataract extraction 2019   Symptomatic PVCs    Tinnitus of both ears    Ventral hernia    Past Surgical History:  Procedure Laterality Date   ANTERIOR VITRECTOMY Left 11/28/2018   Procedure: ANTERIOR VITRECTOMY;  Surgeon: Ferol Rogue, MD;  Location: [facility] ORS;  Service: Ophthalmology;  Laterality: Left;   CATARACT EXTRACTION W/PHACO Right 10/10/2018   Procedure: CATARACT EXTRACTION PHACO AND INTRAOCULAR LENS PLACEMENT (IOC);  Surgeon: Ferol Rogue, MD;  Location: [facility] ORS;  Service: Ophthalmology;  Laterality: Right;  US  00:41 CDE 6.87 Fluid pack lot # [id number]   CATARACT EXTRACTION W/PHACO Left 11/28/2018   Procedure: CATARACT EXTRACTION PHACO AND INTRAOCULAR LENS PLACEMENT (IOC)-LEFT;  Surgeon: Ferol Rogue, MD;  Location: [facility] ORS;  Service: Ophthalmology;  Laterality: Left;  US  00:47.7 CDE 8.92 Fluid Pack lot # [id number]   CHOLECYSTECTOMY  1968   CHONDROPLASTY Right 01/02/2018   Procedure: CHONDROPLASTY;  Surgeon: Mardee Lynwood SQUIBB, MD;  Location: [facility] ORS;  Service: Orthopedics;  Laterality: Right;   COLONOSCOPY  2014   COLONOSCOPY N/A 07/13/2024   Procedure: COLONOSCOPY;  Surgeon: Unk Corinn Skiff, MD;  Location: [facility] ENDOSCOPY;  Service: Gastroenterology;  Laterality: N/A;   CORONARY STENT INTERVENTION N/A 10/19/2021   Procedure: CORONARY STENT INTERVENTION;  Surgeon: Ammon Blunt, MD;  Location: [facility] INVASIVE CV LAB;  Service: Cardiovascular;  Laterality: N/A;   ESOPHAGOGASTRODUODENOSCOPY N/A 07/13/2024   Procedure: EGD (ESOPHAGOGASTRODUODENOSCOPY);  Surgeon: Unk Corinn Skiff, MD;  Location: [facility] ENDOSCOPY;  Service: Gastroenterology;  Laterality: N/A;   HERNIA REPAIR Left 04/18/1996   inguinal hernia repair/ Dr Dessa   HERNIA REPAIR Right 02/26/1996   Dr Dessa   HERNIA REPAIR Left 08/07/2002   Dr Dessa   HERNIORRHAPHY, INGUINAL, ROBOT-ASSISTED,  LAPAROSCOPIC Left 07/29/2024   Procedure: HERNIORRHAPHY, INGUINAL, ROBOT-ASSISTED, LAPAROSCOPIC;  Surgeon: Gary Gary FALCON, MD;  Location: [facility] ORS;  Service: General;  Laterality: Left;   INSERTION OF MESH  07/29/2024   Procedure: INSERTION OF MESH;  Surgeon: Gary Gary FALCON, MD;  Location: [facility] ORS;  Service: General;;   JOINT REPLACEMENT     KNEE ARTHROPLASTY Right 04/30/2020   Procedure: COMPUTER ASSISTED TOTAL KNEE ARTHROPLASTY;  Surgeon: Mardee Lynwood SQUIBB, MD;  Location: [facility] ORS;  Service: Orthopedics;  Laterality: Right;   KNEE ARTHROSCOPY Right 01/02/2018   Procedure: ARTHROSCOPY KNEE;  Surgeon: Mardee Lynwood SQUIBB, MD;  Location: [facility] ORS;  Service: Orthopedics;  Laterality: Right;   KNEE ARTHROSCOPY Left 02/14/2012   partial menisectomy and chondroplasty   KNEE ARTHROSCOPY WITH MEDIAL MENISECTOMY Right 01/02/2018   Procedure: KNEE ARTHROSCOPY WITH MEDIAL MENISECTOMY;  Surgeon: Mardee Lynwood SQUIBB, MD;  Location: [facility] ORS;  Service: Orthopedics;  Laterality: Right;   LEFT HEART CATH AND CORONARY ANGIOGRAPHY N/A 10/19/2021   Procedure: LEFT HEART CATH AND CORONARY ANGIOGRAPHY;  Surgeon: Ammon Blunt, MD;  Location: [facility] INVASIVE CV LAB;  Service: Cardiovascular;  Laterality: N/A;   LEFT HEART CATH AND CORONARY ANGIOGRAPHY Left 10/10/2022   Procedure: LEFT HEART CATH AND CORONARY ANGIOGRAPHY;  Surgeon: Ammon Blunt, MD;  Location: [facility] INVASIVE CV LAB;  Service: Cardiovascular;  Laterality: Left;   POLYPECTOMY  07/13/2024   Procedure: POLYPECTOMY, INTESTINE;  Surgeon: Unk Corinn Skiff, MD;  Location: [facility] ENDOSCOPY;  Service: Gastroenterology;;   TONSILLECTOMY     as a child   TOTAL KNEE ARTHROPLASTY Left 03/29/2015   [facility] Dr. Mardee   VASECTOMY     Family History  Problem Relation Age of Onset   Heart disease Mother        A fib   Healthy Sister    Social History   Tobacco Use   Smoking status: Former    Current packs/day: 0.00    Types: Cigarettes    Quit date:  12/24/1964    Years since quitting: 59.7   Smokeless tobacco: Never  Substance Use Topics   Alcohol  use: Yes    Comment: 0-1 beer or glass of wine monthly   LABS:  Hospital Outpatient Visit on 09/22/2024  Component Date Value Ref Range Status   WBC 09/22/2024 5.8  4.0 - 10.5 K/uL Final   RBC 09/22/2024 3.75 (L)  4.22 - 5.81 MIL/uL Final   Hemoglobin 09/22/2024 11.4 (L)  13.0 - 17.0 g/dL Final   HCT 90/70/7974 36.4 (L)  39.0 - 52.0 % Final   MCV 09/22/2024 97.1  80.0 - 100.0 fL Final   MCH 09/22/2024 30.4  26.0 - 34.0 pg Final   MCHC 09/22/2024 31.3  30.0 - 36.0 g/dL Final   RDW 90/70/7974 13.3  11.5 - 15.5 % Final   Platelets 09/22/2024 178  150 - 400 K/uL Final   nRBC 09/22/2024 0.0  0.0 - 0.2 % Final  Performed at [facility], [address] [place], [place] [address]   Lab Results  Component Value Date   NA 135 08/07/2024   CL 106 08/07/2024   K 3.7 08/07/2024   CO2 22 08/07/2024   BUN 28 (H) 08/07/2024   CREATININE 1.30 (H) 08/08/2024   GFRNONAA 54 (L) 08/08/2024   CALCIUM  8.0 (L) 08/07/2024   ALBUMIN 3.7 08/05/2024   GLUCOSE 125 (H) 08/07/2024    ECG: Date: 07/15/2024  Time ECG obtained: 1013 AM Rate: 67 bpm Rhythm: normal sinus; RBBB Axis (leads I and aVF): left Intervals: PR 192 ms. QRS 146 ms. QTc 469 ms. ST segment and T wave changes: No evidence of acute T wave abnormalities or significant ST segment elevation or depression.  Evidence of a possible, age undetermined, prior infarct:  No Comparison: Similar to previous tracing obtained on 07/11/2024   IMAGING / PROCEDURES: CT ABDOMEN PELVIS W CONTRAST performed on 08/05/2024 6.7 x 2.9 cm fluid collection is noted in left inguinal canal most consistent with postoperative seroma. Small fat containing supraumbilical ventral hernia is noted. Stable moderate prostatic enlargement. Aortic atherosclerosis  TRANSTHORACIC ECHOCARDIOGRAM performed on 07/15/2024 Left ventricular ejection fraction, by  estimation, is 55 to 60%. The left ventricle has normal function. The left ventricle has no regional wall motion abnormalities. Left ventricular diastolic parameters are consistent with Grade I diastolic dysfunction (impaired relaxation).  Right ventricular systolic function is normal. The right ventricular size is normal.  The mitral valve is normal in structure. Mild mitral valve regurgitation. No evidence of mitral stenosis.  The aortic valve is normal in structure. Aortic valve regurgitation is not visualized. No aortic stenosis is present.  The inferior vena cava is normal in size with greater than 50% respiratory variability, suggesting right atrial pressure of 3 mmHg.    CT ANGIO GI BLEED performed on 07/12/2024 No evidence of active GI bleed. Sigmoid diverticulosis. Left anterolateral urinary bladder mass or diverticulum extending into the left inguinal hernia. Consider urologic consultation. Probable AVN in the right femoral head without subchondral collapse. Aortic atherosclerosis   LEFT HEART CATHETERIZATION AND CORONARY ANGIOGRAPHY performed on 10/10/2022 Low normal left ventricular systolic function with an EF of 50-55% Multivessel CAD Prox LAD lesion is 50% stenosed. Prox RCA lesion is 20% stenosed. Ost LM to Mid LM lesion is 40% stenosed. 1st Diag lesion is 60% stenosed. Mid RCA lesion is 20% stenosed. Non-stenotic Prox Cx lesion was previously treated. Recommendations Medical therapy. Aggressive risk factor modification    IMPRESSION AND PLAN: Gary Mueller has been referred for pre-anesthesia review and clearance prior to him undergoing the planned anesthetic and procedural courses. Available labs, pertinent testing, and imaging results were personally reviewed by me in preparation for upcoming operative/procedural course. [facility] medical record has been updated following extensive record review and patient interview with PAT staff.   This patient has been  appropriately cleared by cardiology with an overall LOW risk of patient experiencing significant perioperative cardiovascular complications. Based on clinical review performed today (09/24/24), barring any significant acute changes in the patient's overall condition, it is anticipated that he will be able to proceed with the planned surgical intervention. Any acute changes in clinical condition may necessitate his procedure being postponed and/or cancelled. Patient will meet with anesthesia team (MD and/or CRNA) on the day of his procedure for preoperative evaluation/assessment. Questions regarding anesthetic course will be fielded at that time.   Pre-surgical instructions were reviewed with the patient during his PAT appointment, and questions  were fielded to satisfaction by PAT clinical staff. He has been instructed on which medications that he will need to hold prior to surgery, as well as the ones that have been deemed safe/appropriate to take on the day of his procedure. As part of the general education provided by PAT, patient made aware both verbally and in writing, that he would need to abstain from the use of any illegal substances during his perioperative course. He was advised that failure to follow the provided instructions could necessitate case cancellation or result in serious perioperative complications up to and including death. Patient encouraged to contact PAT and/or his surgeon's office to discuss any questions or concerns that may arise prior to surgery; verbalized understanding.   Dorise Pereyra, MSN, APRN, FNP-C, CEN [facility]  Perioperative Services Nurse Practitioner Phone: [phone] Fax: [phone] 09/24/24 1:46 PM  NOTE: This note has been prepared using Dragon dictation software. Despite my best ability to proofread, there is always the potential that unintentional transcriptional errors may still occur from this process.

## 2024-09-24 MED ORDER — PROPOFOL 1000 MG/100ML IV EMUL
INTRAVENOUS | Status: AC
  Filled 2024-09-24: qty 300

## 2024-09-24 MED ORDER — PHENYLEPHRINE 80 MCG/ML (10ML) SYRINGE FOR IV PUSH (FOR BLOOD PRESSURE SUPPORT)
PREFILLED_SYRINGE | INTRAVENOUS | Status: AC
  Filled 2024-09-24: qty 10

## 2024-09-24 MED ORDER — PROPOFOL 1000 MG/100ML IV EMUL
INTRAVENOUS | Status: AC
  Filled 2024-09-24: qty 200

## 2024-09-24 MED ORDER — PROPOFOL 10 MG/ML IV BOLUS
INTRAVENOUS | Status: AC
Start: 2024-09-24 — End: 2024-09-24
  Filled 2024-09-24: qty 20

## 2024-09-24 MED ORDER — PROPOFOL 1000 MG/100ML IV EMUL
INTRAVENOUS | Status: AC
  Filled 2024-09-24: qty 100

## 2024-09-25 ENCOUNTER — Ambulatory Visit
Admission: RE | Admit: 2024-09-25 | Discharge: 2024-09-25 | Disposition: A | Discharge status: Home / Self Care | Attending: Surgery | Admitting: Surgery

## 2024-09-25 ENCOUNTER — Ambulatory Visit: Payer: Self-pay | Admitting: Urgent Care

## 2024-09-25 ENCOUNTER — Encounter
Admission: RE | Disposition: A | Discharge status: Home / Self Care | Payer: Self-pay | Source: Home / Self Care | Attending: Surgery

## 2024-09-25 ENCOUNTER — Encounter: Payer: Self-pay | Admitting: Surgery

## 2024-09-25 ENCOUNTER — Other Ambulatory Visit: Payer: Self-pay

## 2024-09-25 ENCOUNTER — Encounter

## 2024-09-25 DIAGNOSIS — I255 Ischemic cardiomyopathy: Secondary | ICD-10-CM | POA: Diagnosis not present

## 2024-09-25 DIAGNOSIS — N1831 Chronic kidney disease, stage 3a: Secondary | ICD-10-CM | POA: Insufficient documentation

## 2024-09-25 DIAGNOSIS — I509 Heart failure, unspecified: Secondary | ICD-10-CM | POA: Diagnosis not present

## 2024-09-25 DIAGNOSIS — I130 Hypertensive heart and chronic kidney disease with heart failure and stage 1 through stage 4 chronic kidney disease, or unspecified chronic kidney disease: Secondary | ICD-10-CM | POA: Diagnosis not present

## 2024-09-25 DIAGNOSIS — I252 Old myocardial infarction: Secondary | ICD-10-CM | POA: Insufficient documentation

## 2024-09-25 DIAGNOSIS — K436 Other and unspecified ventral hernia with obstruction, without gangrene: Secondary | ICD-10-CM

## 2024-09-25 DIAGNOSIS — F32A Depression, unspecified: Secondary | ICD-10-CM | POA: Insufficient documentation

## 2024-09-25 DIAGNOSIS — I2510 Atherosclerotic heart disease of native coronary artery without angina pectoris: Secondary | ICD-10-CM | POA: Insufficient documentation

## 2024-09-25 DIAGNOSIS — K439 Ventral hernia without obstruction or gangrene: Secondary | ICD-10-CM | POA: Diagnosis present

## 2024-09-25 DIAGNOSIS — M1990 Unspecified osteoarthritis, unspecified site: Secondary | ICD-10-CM | POA: Diagnosis not present

## 2024-09-25 DIAGNOSIS — Z87891 Personal history of nicotine dependence: Secondary | ICD-10-CM | POA: Insufficient documentation

## 2024-09-25 DIAGNOSIS — K219 Gastro-esophageal reflux disease without esophagitis: Secondary | ICD-10-CM | POA: Diagnosis not present

## 2024-09-25 DIAGNOSIS — Z79899 Other long term (current) drug therapy: Secondary | ICD-10-CM | POA: Diagnosis not present

## 2024-09-25 DIAGNOSIS — F419 Anxiety disorder, unspecified: Secondary | ICD-10-CM | POA: Insufficient documentation

## 2024-09-25 DIAGNOSIS — I4891 Unspecified atrial fibrillation: Secondary | ICD-10-CM | POA: Insufficient documentation

## 2024-09-25 HISTORY — PX: INSERTION OF MESH: SHX5868

## 2024-09-25 HISTORY — PX: VENTRAL HERNIA REPAIR: SHX424

## 2024-09-25 SURGERY — REPAIR, HERNIA, VENTRAL
Anesthesia: General

## 2024-09-25 MED ORDER — DEXAMETHASONE SODIUM PHOSPHATE 10 MG/ML IJ SOLN
INTRAMUSCULAR | Status: DC | PRN
  Administered 2024-09-25: 10 mg via INTRAVENOUS

## 2024-09-25 MED ORDER — CHLORHEXIDINE GLUCONATE CLOTH 2 % EX PADS
6.0000 | MEDICATED_PAD | Freq: Once | CUTANEOUS | Status: AC
  Administered 2024-09-25: 6 via TOPICAL

## 2024-09-25 MED ORDER — SODIUM CHLORIDE 0.9 % IV SOLN
INTRAVENOUS | Status: DC
Start: 2024-09-25 — End: 2024-09-25

## 2024-09-25 MED ORDER — LACTATED RINGERS IV SOLN: INTRAVENOUS | Status: DC | PRN

## 2024-09-25 MED ORDER — SUGAMMADEX SODIUM 200 MG/2ML IV SOLN
INTRAVENOUS | Status: DC | PRN
Start: 2024-09-25 — End: 2024-09-25
  Administered 2024-09-25: 200 mg via INTRAVENOUS

## 2024-09-25 MED ORDER — OXYCODONE HCL 5 MG PO TABS
ORAL_TABLET | ORAL | Status: AC
  Filled 2024-09-25: qty 1

## 2024-09-25 MED ORDER — FENTANYL CITRATE (PF) 100 MCG/2ML IJ SOLN
INTRAMUSCULAR | Status: DC | PRN
  Administered 2024-09-25 (×2): 50 ug via INTRAVENOUS

## 2024-09-25 MED ORDER — FENTANYL CITRATE (PF) 100 MCG/2ML IJ SOLN
INTRAMUSCULAR | Status: AC
  Filled 2024-09-25: qty 2

## 2024-09-25 MED ORDER — LIDOCAINE HCL (CARDIAC) PF 100 MG/5ML IV SOSY
PREFILLED_SYRINGE | INTRAVENOUS | Status: DC | PRN
Start: 2024-09-25 — End: 2024-09-25
  Administered 2024-09-25: 80 mg via INTRAVENOUS

## 2024-09-25 MED ORDER — BUPIVACAINE-EPINEPHRINE (PF) 0.25% -1:200000 IJ SOLN
INTRAMUSCULAR | Status: DC | PRN
  Administered 2024-09-25: 30 mL

## 2024-09-25 MED ORDER — ONDANSETRON HCL 4 MG/2ML IJ SOLN
INTRAMUSCULAR | Status: DC | PRN
Start: 2024-09-25 — End: 2024-09-25
  Administered 2024-09-25: 4 mg via INTRAVENOUS

## 2024-09-25 MED ORDER — ACETAMINOPHEN 500 MG PO TABS
ORAL_TABLET | ORAL | Status: AC
  Filled 2024-09-25: qty 2

## 2024-09-25 MED ORDER — CEFAZOLIN SODIUM-DEXTROSE 2-4 GM/100ML-% IV SOLN
INTRAVENOUS | Status: AC
  Filled 2024-09-25: qty 100

## 2024-09-25 MED ORDER — HYDROCODONE-ACETAMINOPHEN 5-325 MG PO TABS: 1.0000 | ORAL_TABLET | Freq: Four times a day (QID) | ORAL | 0 refills | Status: DC | PRN

## 2024-09-25 MED ORDER — CEFAZOLIN SODIUM-DEXTROSE 2-4 GM/100ML-% IV SOLN
2.0000 g | INTRAVENOUS | Status: AC
  Administered 2024-09-25: 2 g via INTRAVENOUS

## 2024-09-25 MED ORDER — ACETAMINOPHEN 500 MG PO TABS
1000.0000 mg | ORAL_TABLET | ORAL | Status: AC
  Administered 2024-09-25: 1000 mg via ORAL

## 2024-09-25 MED ORDER — CHLORHEXIDINE GLUCONATE 0.12 % MT SOLN
15.0000 mL | Freq: Once | OROMUCOSAL | Status: AC
  Administered 2024-09-25: 15 mL via OROMUCOSAL

## 2024-09-25 MED ORDER — FENTANYL CITRATE (PF) 100 MCG/2ML IJ SOLN
25.0000 ug | INTRAMUSCULAR | Status: DC | PRN
  Administered 2024-09-25: 50 ug via INTRAVENOUS
  Administered 2024-09-25: 25 ug via INTRAVENOUS

## 2024-09-25 MED ORDER — EPHEDRINE SULFATE-NACL 50-0.9 MG/10ML-% IV SOSY
PREFILLED_SYRINGE | INTRAVENOUS | Status: DC | PRN
  Administered 2024-09-25: 10 mg via INTRAVENOUS
  Administered 2024-09-25 (×2): 5 mg via INTRAVENOUS

## 2024-09-25 MED ORDER — ROCURONIUM BROMIDE 100 MG/10ML IV SOLN
INTRAVENOUS | Status: DC | PRN
  Administered 2024-09-25: 50 mg via INTRAVENOUS

## 2024-09-25 MED ORDER — DROPERIDOL 2.5 MG/ML IJ SOLN: 0.6250 mg | Freq: Once | INTRAMUSCULAR | Status: DC | PRN

## 2024-09-25 MED ORDER — OXYCODONE HCL 5 MG PO TABS
5.0000 mg | ORAL_TABLET | Freq: Once | ORAL | Status: AC
  Administered 2024-09-25: 5 mg via ORAL

## 2024-09-25 MED ORDER — CHLORHEXIDINE GLUCONATE 0.12 % MT SOLN
OROMUCOSAL | Status: AC
  Filled 2024-09-25: qty 15

## 2024-09-25 MED ORDER — PROPOFOL 10 MG/ML IV BOLUS
INTRAVENOUS | Status: DC | PRN
  Administered 2024-09-25: 100 mg via INTRAVENOUS

## 2024-09-25 MED ORDER — GABAPENTIN 300 MG PO CAPS
ORAL_CAPSULE | ORAL | Status: AC
  Filled 2024-09-25: qty 1

## 2024-09-25 MED ORDER — BUPIVACAINE-EPINEPHRINE (PF) 0.25% -1:200000 IJ SOLN
INTRAMUSCULAR | Status: AC
  Filled 2024-09-25: qty 30

## 2024-09-25 MED ORDER — ORAL CARE MOUTH RINSE: 15.0000 mL | Freq: Once | OROMUCOSAL | Status: AC

## 2024-09-25 MED ORDER — GABAPENTIN 300 MG PO CAPS
300.0000 mg | ORAL_CAPSULE | ORAL | Status: AC
  Administered 2024-09-25: 300 mg via ORAL

## 2024-09-25 SURGICAL SUPPLY — 29 items
BLADE CLIPPER SURG (BLADE) IMPLANT
CHLORAPREP W/TINT 26 (MISCELLANEOUS) IMPLANT
CLIP APPLIE 11 MED OPEN (CLIP) IMPLANT
CLIP APPLIE 13 LRG OPEN (CLIP) IMPLANT
DERMABOND ADVANCED .7 DNX12 (GAUZE/BANDAGES/DRESSINGS) IMPLANT
DRAPE LAPAROTOMY 100X77 ABD (DRAPES) ×2 IMPLANT
DRSG OPSITE POSTOP 4X10 (GAUZE/BANDAGES/DRESSINGS) IMPLANT
DRSG OPSITE POSTOP 4X8 (GAUZE/BANDAGES/DRESSINGS) IMPLANT
ELECT CAUTERY BLADE 6.4 (BLADE) ×2 IMPLANT
ELECTRODE REM PT RTRN 9FT ADLT (ELECTROSURGICAL) ×2 IMPLANT
GLOVE BIO SURGEON STRL SZ7 (GLOVE) ×2 IMPLANT
GOWN STRL REUS W/ TWL LRG LVL3 (GOWN DISPOSABLE) ×4 IMPLANT
KIT TURNOVER KIT A (KITS) ×2 IMPLANT
MANIFOLD NEPTUNE II (INSTRUMENTS) ×2 IMPLANT
MESH VENTRALEX ST 2.5 CRC MED (Mesh General) IMPLANT
NDL FRENCH SPRG EYE 1/2 CRC (NEEDLE) IMPLANT
NDL HYPO 22X1.5 SAFETY MO (MISCELLANEOUS) ×2 IMPLANT
NEEDLE HYPO 22X1.5 SAFETY MO (MISCELLANEOUS) ×2 IMPLANT
PACK BASIN MINOR ARMC (MISCELLANEOUS) ×2 IMPLANT
SOLN STERILE WATER 1000 ML (IV SOLUTION) ×2 IMPLANT
SOLN STERILE WATER BTL 1000 ML (IV SOLUTION) ×2 IMPLANT
SPONGE T-LAP 18X18 ~~LOC~~+RFID (SPONGE) ×2 IMPLANT
STAPLER SKIN PROX 35W (STAPLE) IMPLANT
SUT ETHIBOND 0 MO6 C/R (SUTURE) ×2 IMPLANT
SUT VIC AB 2-0 SH 27XBRD (SUTURE) ×4 IMPLANT
SUTURE MNCRL 4-0 27XMF (SUTURE) IMPLANT
SYR 20ML LL LF (SYRINGE) ×4 IMPLANT
TRAP FLUID SMOKE EVACUATOR (MISCELLANEOUS) ×2 IMPLANT
WATER STERILE IRR 500ML POUR (IV SOLUTION) ×2 IMPLANT

## 2024-09-25 NOTE — Interval H&P Note (Signed)
 History and Physical Interval Note:  09/25/2024 8:49 AM  Gary Mueller  has presented today for surgery, with the diagnosis of ventral hernia non-recurrent, reducible 3 cm.  The various methods of treatment have been discussed with the patient and family. After consideration of risks, benefits and other options for treatment, the patient has consented to  Procedure(s) with comments: REPAIR, HERNIA, VENTRAL (N/A) - open proceduren as a surgical intervention.  The patient's history has been reviewed, patient examined, no change in status, stable for surgery.  I have reviewed the patient's chart and labs.  Questions were answered to the patient's satisfaction.     March Steyer F Rip Hawes

## 2024-09-25 NOTE — Discharge Instructions (Signed)
 Open Hernia Repair, Adult, Care After After an open hernia repair, it's common to have: Pain in your belly. Slight bruising. A little swelling. A small amount of blood from the cut from surgery. Follow these instructions at home: Medicines Take your medicines only as told by your health care provider. If told, take steps to prevent trouble pooping. You [place] need to: Drink enough fluid to keep your pee (urine) pale yellow. Take medicines to help you poop. Eat foods high in fiber. These include beans, whole grains, and fresh fruits and vegetables. Ask your provider if you should avoid driving or using machines while you're taking your medicine. If you were given antibiotics, take them as told by your provider. Do not stop taking them even if you start to feel better. Incision care  Take care of your cut from surgery as told by your provider. Make sure you: Wash your hands with soap and water for at least 20 seconds before and after you change your bandage. If you can't use soap and water, use hand sanitizer. Change your bandage. Leave stitches or skin glue in place for at least 2 weeks. Leave tape strips alone unless you're told to take them off. You [place] trim the edges of the tape strips if they curl up. Check the area around your cut every day for signs of infection. Check for: More redness, swelling, or pain. More fluid or blood. Warmth. Pus or a bad smell. Wear loose clothes while your cut heals. Activity Rest as told. You [place] have to avoid lifting. Ask how much weight you can safely lift. Do not play contact sports until your provider says it's safe. Return to normal activities when you're told. Ask what things are safe for you to do. General instructions If you were given a sedative during your surgery, do not drive or use machines until you're told it's safe. A sedative can make you sleepy. Do not take baths, swim, or use a hot tub until told. Ask if showers are okay. You [place]  need to take sponge baths only. Hold a pillow over your belly when you cough or sneeze. This helps with pain. Do not smoke, vape, or use products with nicotine or tobacco in them. If you need help quitting, talk with your provider. Your provider [place] give you more instructions. Make sure you know what you can and can't do. Contact a health care provider if: You have signs of infection. You have a fever or chills. You have blood in your poop. You haven't pooped in 2-3 days. Your pain doesn't get better with medicine. Get help right away if: You have chest pain. You're short of breath. You feel faint or light-headed. You have very bad pain. You start to vomit. You have pain, swelling, or redness in a leg. These symptoms [place] be an emergency. Call 911 right away. Do not wait to see if the symptoms will go away. Do not drive yourself to the hospital. This information is not intended to replace advice given to you by your health care provider. Make sure you discuss any questions you have with your health care provider. Document Revised: 09/13/2023 Document Reviewed: 03/06/2023 Elsevier Patient Education  2024 [employer].

## 2024-09-25 NOTE — Transfer of Care (Signed)
 Immediate Anesthesia Transfer of Care Note  Patient: Gary Mueller  Procedure(s) Performed: REPAIR, HERNIA, VENTRAL INSERTION OF MESH  Patient Location: PACU  Anesthesia Type:General  Level of Consciousness: awake, alert , and oriented  Airway & Oxygen  Therapy: Patient Spontanous Breathing and Patient connected to face mask oxygen   Post-op Assessment: Report given to RN and Post -op Vital signs reviewed and stable  Post vital signs: Reviewed and stable  Last Vitals:  Vitals Value Taken Time  BP 144/63   Temp    Pulse 75 09/25/24 10:33  Resp 23 09/25/24 10:33  SpO2 97 % 09/25/24 10:33  Vitals shown include unfiled device data.  Last Pain:  Vitals:   09/25/24 0845  TempSrc: Temporal  PainSc: 0-No pain         Complications: No notable events documented.

## 2024-09-25 NOTE — Op Note (Addendum)
 Ventral Hernia Repair with 6.4 cm round BARD mesh  Pre-operative Diagnosis: ventral hernia  Post-operative Diagnosis: same  Surgeon: Laneta Luna, MD FACS  Anesthesia: Gen. with endotracheal tube   Findings: 3 cm hernia w  chronically incarcerated piece of omentum  Estimated Blood Loss: 5cc                      Complications: none              Procedure Details  The patient was seen again in the Holding Room. The benefits, complications, treatment options, and expected outcomes were discussed with the patient. The risks of bleeding, infection, recurrence of symptoms, failure to resolve symptoms, bowel injury, mesh placement, mesh infection, any of which could require further surgery were reviewed with the patient. The likelihood of improving the patient's symptoms with return to their baseline status is good.  The patient and/or family concurred with the proposed plan, giving informed consent.  The patient was taken to Operating Room, identified and the procedure verified.  A Time Out was held and the above information confirmed.  Prior to the induction of general anesthesia, antibiotic prophylaxis was administered. VTE prophylaxis was in place. General endotracheal anesthesia was then administered and tolerated well. After the induction, the abdomen was prepped with Chloraprep and draped in the sterile fashion. The patient was positioned in the supine position.   Incision was created with a scalpel over the hernia defect. Electrocautery was used to dissect through subcutaneous tissue, the hernia sac was opened excised , chronic omentum was encountered and was reduced back to the abdominal cavity. The hernia was measured 3 cms.  I selected 6.4 cm ventralex patch mesh.  I placed 6 interrupted Ethibond stitches in the mesh and secured the mesh to the abd wall by parachuting the sutures. We placed the mesh in an underlay fashion. THe mesh laid flat and nicely against the abdominal wall. I  closed the hernia defect with interrupted 0 Ethibond sutures.   Incision was closed in a 2 layer fashion with 3-0 Vicryl and 4-0 Monocryl. Dermabond was used to coat the skin. Marcaine   was used to inject all the incision sites. Patient tolerated procedure well and there were no immediate complications. Needle and laparotomy counts were correct   Laneta Luna, MD, FACS

## 2024-09-25 NOTE — Anesthesia Preprocedure Evaluation (Signed)
 Anesthesia Evaluation  Patient identified by MRN, date of birth, ID band Patient awake    Reviewed: Allergy & Precautions, NPO status , Patient's Chart, lab work & pertinent test results  History of Anesthesia Complications Negative for: history of anesthetic complications  Airway Mallampati: II       Dental  (+) Dental Advidsory Given, Missing, Chipped, Teeth Intact   Pulmonary shortness of breath and with exertion, neg sleep apnea, neg COPD, neg recent URI, Not current smoker, former smoker          Cardiovascular Exercise Tolerance: Good hypertension, Pt. on medications (-) angina + CAD, + Past MI and + Cardiac Stents  (-) CHF + dysrhythmias (hx of afib) Atrial Fibrillation (-) Valvular Problems/Murmurs  ECHO 07/15/24: 1. Left ventricular ejection fraction, by estimation, is 55 to 60%. The left ventricle has normal function. The left ventricle has no regional wall motion abnormalities. Left ventricular diastolic parameters are consistent with Grade I diastolic dysfunction (impaired relaxation).   2. Right ventricular systolic function is normal. The right ventricular size is normal.   3. The mitral valve is normal in structure. Mild mitral valve regurgitation. No evidence of mitral stenosis.   4. The aortic valve is normal in structure. Aortic valve regurgitation is not visualized. No aortic stenosis is present.   5. The inferior vena cava is normal in size with greater than 50% respiratory variability, suggesting right atrial pressure of 3 mmHg.      Neuro/Psych neg Seizures PSYCHIATRIC DISORDERS Anxiety Depression       GI/Hepatic Neg liver ROS,GERD  Medicated and Controlled,,  Endo/Other  neg diabetesHypothyroidism    Renal/GU Renal disease (CKD stage 3a)     Musculoskeletal   Abdominal   Peds  Hematology   Anesthesia Other Findings Past Medical History: No date: Allergy No date: Anxiety No date:  Arthritis 10/20/2016: Cancer [facility])     Comment:  Basal Cell Skin Cancer; lip, neck 07/07/2019: Cystoid macular edema of left eye No date: Depression No date: Dysrhythmia     Comment:  A FIB No date: GERD (gastroesophageal reflux disease) No date: HOH (hard of hearing)     Comment:  Bilateral No date: Hyperlipidemia No date: Hypertension     Comment:  Patient denies No date: Hypothyroidism No date: IBS (irritable bowel syndrome) No date: Inguinal hernia     Comment:  bilateral No date: Pneumonia No date: Thyroid  disease No date: Tinnitus of both ears   Reproductive/Obstetrics                              Anesthesia Physical Anesthesia Plan  ASA: 3  Anesthesia Plan: General   Post-op Pain Management:    Induction: Intravenous  PONV Risk Score and Plan: 2 and Treatment may vary due to age or medical condition, Ondansetron  and Dexamethasone   Airway Management Planned: Oral ETT  Additional Equipment:   Intra-op Plan:   Post-operative Plan: Extubation in OR  Informed Consent: I have reviewed the patients History and Physical, chart, labs and discussed the procedure including the risks, benefits and alternatives for the proposed anesthesia with the patient or authorized representative who has indicated his/her understanding and acceptance.       Plan Discussed with:   Anesthesia Plan Comments:          Anesthesia Quick Evaluation

## 2024-09-25 NOTE — Anesthesia Procedure Notes (Signed)
 Procedure Name: Intubation Date/Time: 09/25/2024 9:30 AM  Performed by: Erie Jyl MATSU, CRNAPre-anesthesia Checklist: Patient identified, Patient being monitored, Timeout performed, Emergency Drugs available and Suction available Patient Re-evaluated:Patient Re-evaluated prior to induction Oxygen  Delivery Method: Circle system utilized Preoxygenation: Pre-oxygenation with 100% oxygen  Induction Type: IV induction Ventilation: Mask ventilation without difficulty Laryngoscope Size: Mac and 4 Grade View: Grade I Tube type: Oral Tube size: 7.5 mm Number of attempts: 1 Airway Equipment and Method: Stylet Placement Confirmation: ETT inserted through vocal cords under direct vision, positive ETCO2 and breath sounds checked- equal and bilateral Secured at: 21 cm Tube secured with: Tape Dental Injury: Teeth and Oropharynx as per pre-operative assessment

## 2024-09-26 ENCOUNTER — Encounter: Payer: Self-pay | Admitting: Surgery

## 2024-09-29 ENCOUNTER — Encounter

## 2024-10-01 ENCOUNTER — Ambulatory Visit (INDEPENDENT_AMBULATORY_CARE_PROVIDER_SITE_OTHER): Admitting: Physician Assistant

## 2024-10-01 ENCOUNTER — Encounter: Payer: Self-pay | Admitting: Physician Assistant

## 2024-10-01 VITALS — BP 145/76 | HR 65 | Ht 72.0 in | Wt 167.2 lb

## 2024-10-01 DIAGNOSIS — N401 Enlarged prostate with lower urinary tract symptoms: Secondary | ICD-10-CM

## 2024-10-01 DIAGNOSIS — N138 Other obstructive and reflux uropathy: Secondary | ICD-10-CM

## 2024-10-01 LAB — MICROSCOPIC EXAMINATION: WBC, UA: >30 /HPF — AB (ref 0–5)

## 2024-10-01 LAB — URINALYSIS, COMPLETE
Bilirubin, UA: NEGATIVE
Glucose, UA: NEGATIVE
Ketones, UA: NEGATIVE
Nitrite, UA: NEGATIVE
Specific Gravity, UA: 1.015 (ref 1.005–1.030)
Urobilinogen, Ur: 0.2 mg/dL (ref 0.2–1.0)
pH, UA: 6 (ref 5.0–7.5)

## 2024-10-01 NOTE — Progress Notes (Signed)
 Cath Change/ Replacement  Patient is present today for a catheter change due to urinary retention.  8ml of water was removed from the balloon, a 16FR coude foley cath was removed without difficulty.  Patient was cleaned and prepped in a sterile fashion with betadine  and 2% lidocaine  jelly was instilled into the urethra. A 16 FR coude foley cath was replaced into the bladder, no complications were noted. Urine return was noted 20ml and urine was yellow in color. The balloon was filled with 10ml of sterile water. A leg bag was attached for drainage.  Patient tolerated well.    Performed by: Asharia Lotter, PA-C   Additional notes: Urine sample obtained from new Foley today for preop UA/CX.  Dry cough on Flomax ; will stop it.  Follow up: Return for Keep follow-up as scheduled.

## 2024-10-02 ENCOUNTER — Encounter

## 2024-10-08 ENCOUNTER — Ambulatory Visit: Payer: Self-pay | Admitting: Physician Assistant

## 2024-10-08 DIAGNOSIS — N401 Enlarged prostate with lower urinary tract symptoms: Secondary | ICD-10-CM

## 2024-10-08 DIAGNOSIS — N138 Other obstructive and reflux uropathy: Secondary | ICD-10-CM

## 2024-10-08 LAB — CULTURE, URINE COMPREHENSIVE

## 2024-10-08 MED ORDER — SULFAMETHOXAZOLE-TRIMETHOPRIM 800-160 MG PO TABS: 1.0000 | ORAL_TABLET | Freq: Two times a day (BID) | ORAL | 0 refills | Status: AC

## 2024-10-09 ENCOUNTER — Ambulatory Visit: Admitting: Physician Assistant

## 2024-10-11 NOTE — Anesthesia Postprocedure Evaluation (Signed)
 Anesthesia Post Note  Patient: Gary Mueller  Procedure(s) Performed: REPAIR, HERNIA, VENTRAL INSERTION OF MESH  Patient location during evaluation: PACU Anesthesia Type: General Level of consciousness: awake and alert Pain management: pain level controlled Vital Signs Assessment: post-procedure vital signs reviewed and stable Respiratory status: spontaneous breathing, nonlabored ventilation, respiratory function stable and patient connected to nasal cannula oxygen  Cardiovascular status: blood pressure returned to baseline and stable Postop Assessment: no apparent nausea or vomiting Anesthetic complications: no   No notable events documented.   Last Vitals:  Vitals:   09/25/24 1130 09/25/24 1146  BP: 133/66 (!) 161/60  Pulse: 60 61  Resp: 10 18  Temp:  (!) 36.3 C  SpO2: 97% 95%    Last Pain:  Vitals:   09/25/24 1146  TempSrc: Temporal  PainSc: 0-No pain                 Prentice Murphy

## 2024-10-13 ENCOUNTER — Ambulatory Visit: Admitting: Surgery

## 2024-10-13 ENCOUNTER — Encounter: Payer: Self-pay | Admitting: Surgery

## 2024-10-13 VITALS — BP 174/79 | HR 52 | Temp 97.8°F | Ht 72.0 in | Wt 168.0 lb

## 2024-10-13 DIAGNOSIS — K436 Other and unspecified ventral hernia with obstruction, without gangrene: Secondary | ICD-10-CM

## 2024-10-13 DIAGNOSIS — Z09 Encounter for follow-up examination after completed treatment for conditions other than malignant neoplasm: Secondary | ICD-10-CM

## 2024-10-13 NOTE — Progress Notes (Signed)
 Outpatient Surgical Follow Up  10/13/2024  Gary Mueller is an 83 y.o. male.   Chief Complaint  Patient presents with   Routine Post Op    HPI: [name] is 4 1/2 weeks out from ventral H repair. Doing very well, had minimal. They are very appreciative. Tolerating PO, back to baseline, ambulating Still has foley  Past Medical History:  Diagnosis Date   Acute gastric ulcer with hemorrhage    Allergy    Anemia    Anxiety    Aortic atherosclerosis    Arthritis    Atrial fibrillation (HCC)    a.) CHA2DS2VASc = 5 (age x 2, CHF, HTN, prior MI/vascular disease) as of 07/25/2024; b.) rate/rhythm maintained on oral metoprolol  succinate; no OAC (does take clopidogrel)   BILATERAL recurrent inguinal hernia    a.) s/p repair RIGHT 02/1996; b.) s/p repair LEFT 03/1996; c.) s/p repair recurrent LEFT 07/2002; d.) planned repair of second recurrence on LEFT 07/2024   BPH (benign prostatic hyperplasia)    CHF (congestive heart failure) (HCC)    CKD stage 3a, GFR 45-59 ml/min (HCC)    Colon polyps    Coronary artery disease    a.) s/p NSTEMI 10/18/2021 with PCI of LCx 10/19/2021 (2.5 x 22 mm Onyx Frontier DES)   Cystoid macular edema of left eye 07/07/2019   Depression    Diverticulosis    DOE (dyspnea on exertion)    Exudative age related macular degeneration (HCC)    GERD (gastroesophageal reflux disease)    HOH (hard of hearing)    Hyperlipidemia    Hypertension    Hypothyroidism    IBS (irritable bowel syndrome)    Ischemic cardiomyopathy    Levoscoliosis of lumbar spine    Long term current use of clopidogrel    NSTEMI (non-ST elevated myocardial infarction) (HCC) 10/18/2021   a.) Troponin peaked at 822 ng/L; b.) LHC 10/19/2021: subtotal occlusion of the LCx (2.5 x 22 mm Onyx Frontier DES)   Pneumonia    Pre-diabetes    RBBB    Right bundle branch block (RBBB)    Skin cancer, basal cell 10/20/2016   a.) lip, neck   Status post bilateral cataract extraction 2019   Symptomatic  PVCs    Tinnitus of both ears    Ventral hernia     Past Surgical History:  Procedure Laterality Date   ANTERIOR VITRECTOMY Left 11/28/2018   Procedure: ANTERIOR VITRECTOMY;  Surgeon: Ferol Rogue, MD;  Location: [facility] ORS;  Service: Ophthalmology;  Laterality: Left;   CATARACT EXTRACTION W/PHACO Right 10/10/2018   Procedure: CATARACT EXTRACTION PHACO AND INTRAOCULAR LENS PLACEMENT (IOC);  Surgeon: Ferol Rogue, MD;  Location: [facility];  Service: Ophthalmology;  Laterality: Right;  US  00:41 CDE 6.87 Fluid pack lot # [id number]   CATARACT EXTRACTION W/PHACO Left 11/28/2018   Procedure: CATARACT EXTRACTION PHACO AND INTRAOCULAR LENS PLACEMENT (IOC)-LEFT;  Surgeon: Ferol Rogue, MD;  Location: [facility] ORS;  Service: Ophthalmology;  Laterality: Left;  US  00:47.7 CDE 8.92 Fluid Pack lot # [id number]   CHOLECYSTECTOMY  1968   CHONDROPLASTY Right 01/02/2018   Procedure: CHONDROPLASTY;  Surgeon: Mardee Lynwood SQUIBB, MD;  Location: [facility];  Service: Orthopedics;  Laterality: Right;   COLONOSCOPY  2014   COLONOSCOPY N/A 07/13/2024   Procedure: COLONOSCOPY;  Surgeon: Unk Corinn Skiff, MD;  Location: [facility] ENDOSCOPY;  Service: Gastroenterology;  Laterality: N/A;   CORONARY STENT INTERVENTION N/A 10/19/2021   Procedure: CORONARY STENT INTERVENTION;  Surgeon: Ammon Blunt, MD;  Location: [facility] INVASIVE  CV LAB;  Service: Cardiovascular;  Laterality: N/A;   ESOPHAGOGASTRODUODENOSCOPY N/A 07/13/2024   Procedure: EGD (ESOPHAGOGASTRODUODENOSCOPY);  Surgeon: Unk Corinn Skiff, MD;  Location: [facility] ENDOSCOPY;  Service: Gastroenterology;  Laterality: N/A;   HERNIA REPAIR Left 04/18/1996   inguinal hernia repair/ Dr Dessa   HERNIA REPAIR Right 02/26/1996   Dr Dessa   HERNIA REPAIR Left 08/07/2002   Dr Dessa   HERNIORRHAPHY, INGUINAL, ROBOT-ASSISTED, LAPAROSCOPIC Left 07/29/2024   Procedure: HERNIORRHAPHY, INGUINAL, ROBOT-ASSISTED, LAPAROSCOPIC;  Surgeon: Jordis Laneta FALCON, MD;  Location: [facility] ORS;   Service: General;  Laterality: Left;   INSERTION OF MESH  07/29/2024   Procedure: INSERTION OF MESH;  Surgeon: Jordis Laneta FALCON, MD;  Location: [facility] ORS;  Service: General;;   INSERTION OF MESH  09/25/2024   Procedure: INSERTION OF MESH;  Surgeon: Jordis Laneta FALCON, MD;  Location: [facility] ORS;  Service: General;;   JOINT REPLACEMENT     KNEE ARTHROPLASTY Right 04/30/2020   Procedure: COMPUTER ASSISTED TOTAL KNEE ARTHROPLASTY;  Surgeon: Mardee Lynwood SQUIBB, MD;  Location: [facility] ORS;  Service: Orthopedics;  Laterality: Right;   KNEE ARTHROSCOPY Right 01/02/2018   Procedure: ARTHROSCOPY KNEE;  Surgeon: Mardee Lynwood SQUIBB, MD;  Location: [facility] ORS;  Service: Orthopedics;  Laterality: Right;   KNEE ARTHROSCOPY Left 02/14/2012   partial menisectomy and chondroplasty   KNEE ARTHROSCOPY WITH MEDIAL MENISECTOMY Right 01/02/2018   Procedure: KNEE ARTHROSCOPY WITH MEDIAL MENISECTOMY;  Surgeon: Mardee Lynwood SQUIBB, MD;  Location: [facility] ORS;  Service: Orthopedics;  Laterality: Right;   LEFT HEART CATH AND CORONARY ANGIOGRAPHY N/A 10/19/2021   Procedure: LEFT HEART CATH AND CORONARY ANGIOGRAPHY;  Surgeon: Ammon Blunt, MD;  Location: [facility] INVASIVE CV LAB;  Service: Cardiovascular;  Laterality: N/A;   LEFT HEART CATH AND CORONARY ANGIOGRAPHY Left 10/10/2022   Procedure: LEFT HEART CATH AND CORONARY ANGIOGRAPHY;  Surgeon: Ammon Blunt, MD;  Location: [facility] INVASIVE CV LAB;  Service: Cardiovascular;  Laterality: Left;   POLYPECTOMY  07/13/2024   Procedure: POLYPECTOMY, INTESTINE;  Surgeon: Unk Corinn Skiff, MD;  Location: [facility] ENDOSCOPY;  Service: Gastroenterology;;   TONSILLECTOMY     as a child   TOTAL KNEE ARTHROPLASTY Left 03/29/2015   [facility] Dr. Mardee   VASECTOMY     VENTRAL HERNIA REPAIR N/A 09/25/2024   Procedure: REPAIR, HERNIA, VENTRAL;  Surgeon: Jordis Laneta FALCON, MD;  Location: [facility] ORS;  Service: General;  Laterality: N/A;  open proceduren    Family History  Problem Relation Age of Onset   Heart  disease Mother        A fib   Healthy Sister     Social History:  reports that he quit smoking about 59 years ago. His smoking use included cigarettes. He has never used smokeless tobacco. He reports current alcohol  use. He reports that he does not use drugs.  Allergies:  Allergies  Allergen Reactions   Codeine     GI intolerance; hallucinations    Statins     Muscle pain   Tamsulosin  Cough    Medications reviewed.    ROS Full ROS performed and is otherwise negative other than what is stated in HPI   BP (!) 174/79   Pulse (!) 52   Temp 97.8 F (36.6 C) (Oral)   Ht 6' (1.829 m)   Wt 168 lb (76.2 kg)   SpO2 98%   BMI 22.78 kg/m   Physical Exam Vitals and nursing note reviewed. Exam conducted with a chaperone present.  Constitutional:      General:  He is not in acute distress.    Appearance: Normal appearance.  Pulmonary:     Effort: Pulmonary effort is normal.     Breath sounds: No stridor.  Abdominal:     General: Abdomen is flat. There is no distension.     Palpations: Abdomen is soft. There is no mass.     Tenderness: There is no abdominal tenderness. There is no guarding or rebound.     Hernia: No hernia is present.     Comments: Incision c/d/I, no recurrence  Skin:    General: Skin is warm.     Capillary Refill: Capillary refill takes less than 2 seconds.  Neurological:     General: No focal deficit present.     Mental Status: He is alert and oriented to person, place, and time.  Psychiatric:        Mood and Affect: Mood normal.        Behavior: Behavior normal.        Thought Content: Thought content normal.        Judgment: Judgment normal.      Assessment/Plan: S/p open ventral H repair doing well w/o complications. I will be happy to see him in a few months. May return back to nml activities.  I personally spent a total of 20 minutes in the care of the patient today including performing a medically appropriate exam/evaluation, counseling and  educating, placing orders, referring and communicating with other health care professionals, documenting clinical information in the EHR, independently interpreting and reviewing images studies and coordinating care.   Laneta Luna, MD [facility] General Surgeon

## 2024-10-13 NOTE — Patient Instructions (Signed)

## 2024-10-20 ENCOUNTER — Encounter
Admission: RE | Admit: 2024-10-20 | Discharge: 2024-10-20 | Disposition: A | Discharge status: Home / Self Care | Source: Ambulatory Visit | Attending: Urology | Admitting: Urology

## 2024-10-20 HISTORY — DX: Sepsis, unspecified organism: A41.9

## 2024-10-20 NOTE — Patient Instructions (Signed)
 Your procedure is scheduled on:10-27-24 Monday Report to the Registration Desk on the 1st floor of the Medical Mall.Then proceed to the 2nd floor Surgery Desk To find out your arrival time, please call [phone] between 1PM - 3PM on:10-24-24 Friday If your arrival time is 6:00 am, do not arrive before that time as the Medical Mall entrance doors do not open until 6:00 am.  REMEMBER: Instructions that are not followed completely may result in serious medical risk, up to and including death; or upon the discretion of your surgeon and anesthesiologist your surgery may need to be rescheduled.  Do not eat food OR drink liquids after midnight the night before surgery.  No gum chewing or hard candies.  One week prior to surgery:Stop NOW (10-20-24) Stop Anti-inflammatories (NSAIDS) such as Advil, Aleve, Ibuprofen, Motrin, Naproxen, Naprosyn and Aspirin  based products such as Excedrin, Goody's Powder, BC Powder. Stop ANY OVER THE COUNTER supplements until after surgery (Vitamin C, Vitamin B12, Multivitamin)  You may however, continue to take Tylenol  if needed for pain up until the day of surgery.  Continue taking all of your other prescription medications up until the day of surgery.  ON THE DAY OF SURGERY ONLY TAKE THESE MEDICATIONS WITH SIPS OF WATER: -amLODipine  (NORVASC )  -levothyroxine  (SYNTHROID )  -pantoprazole  (PROTONIX )   No Alcohol  for 24 hours before or after surgery.  No Smoking including e-cigarettes for 24 hours before surgery.  No chewable tobacco products for at least 6 hours before surgery.  No nicotine patches on the day of surgery.  Do not use any recreational drugs for at least a week (preferably 2 weeks) before your surgery.  Please be advised that the combination of cocaine and anesthesia may have negative outcomes, up to and including death. If you test positive for cocaine, your surgery will be cancelled.  On the morning of surgery brush your teeth with  toothpaste and water, you may rinse your mouth with mouthwash if you wish. Do not swallow any toothpaste or mouthwash.  Do not wear jewelry, make-up, hairpins, clips or nail polish.  For welded (permanent) jewelry: bracelets, anklets, waist bands, etc.  Please have this removed prior to surgery.  If it is not removed, there is a chance that hospital personnel will need to cut it off on the day of surgery.  Do not wear lotions, powders, or perfumes.   Do not shave body hair from the neck down 48 hours before surgery.  Contact lenses, hearing aids and dentures may not be worn into surgery.  Do not bring valuables to the hospital. [facility] is not responsible for any missing/lost belongings or valuables.   Notify your doctor if there is any change in your medical condition (cold, fever, infection).  Wear comfortable clothing (specific to your surgery type) to the hospital.  After surgery, you can help prevent lung complications by doing breathing exercises.  Take deep breaths and cough every 1-2 hours. Your doctor may order a device called an Incentive Spirometer to help you take deep breaths. When coughing or sneezing, hold a pillow firmly against your incision with both hands. This is called "splinting." Doing this helps protect your incision. It also decreases belly discomfort.  If you are being admitted to the hospital overnight, leave your suitcase in the car. After surgery it may be brought to your room.  In case of increased patient census, it may be necessary for you, the patient, to continue your postoperative care in the Same Day Surgery department.  If you are being discharged the day of surgery, you will not be allowed to drive home. You will need a responsible individual to drive you home and stay with you for 24 hours after surgery.   If you are taking public transportation, you will need to have a responsible individual with you.  Please call the Pre-admissions Testing  Dept. at [phone] if you have any questions about these instructions.  Surgery Visitation Policy:  Patients having surgery or a procedure may have two visitors.  Children under the age of 16 must have an adult with them who is not the patient.   Merchandiser, Retail to address health-related social needs:  [email or web address]

## 2024-10-22 ENCOUNTER — Encounter: Payer: Self-pay | Admitting: Urology

## 2024-10-22 NOTE — Progress Notes (Signed)
 Perioperative / Anesthesia Services  Pre-Admission Testing Clinical Review / Pre-Operative Anesthesia Consult  Date: 10/22/24  PATIENT DEMOGRAPHICS: Name: Gary Mueller DOB: 1941-10-11 MRN:   [id number]  Note: Available PAT nursing documentation and vital signs have been reviewed. Clinical nursing staff has updated patient's PMH/PSHx, current medication list, and drug allergies/intolerances to ensure complete and comprehensive history available to assist care teams in MDM as it pertains to the aforementioned surgical procedure and anticipated anesthetic course. Extensive review of available clinical information personally performed. [place] PMH and PSHx updated with any diagnoses/procedures that  may have been inadvertently omitted during his intake with the pre-admission testing department's nursing staff.  PLANNED SURGICAL PROCEDURE(S):   Case: [id number] Date/Time: 10/27/24 0900   Procedure: ENUCLEATION, PROSTATE, USING LASER, WITH MORCELLATION   Anesthesia type: General   Diagnosis: Benign prostatic hyperplasia with urinary obstruction [N40.1, N13.8]   Pre-op diagnosis: Benign Prostatic Hyperplasia with Urinary Obstruction   Location: [facility] OR ROOM 10 / [facility] FOR [facility]   Surgeons: Francisca Redell BROCKS, MD        CLINICAL DISCUSSION: Gary Mueller is a 83 y.o. male who is submitted for pre-surgical anesthesia review and clearance prior to him undergoing the above procedure. Patient is a Former Smoker (quit 11/1964). Pertinent PMH includes: CAD, NSTEMI, ischemic cardiomyopathy, CHF, atrial fibrillation, symptomatic PVCs, RBBB, aortic atherosclerosis, HTN, HLD, pre-diabetes, hypothyroidism, CKD-III, DOE, GERD (on daily PPI), bleeding gastric ulcer, anemia, ventral hernia, BPH, lumbar levoscoliosis, OA, depression, anxiety.   Patient is followed by cardiology Andrea, MD). He was last seen in the cardiology clinic on 08/04/2024; notes reviewed. At the time of his clinic  visit, patient complained of chronic exertional dyspnea that was reported to be stable and at baseline. Patient denied any chest pain, PND, orthopnea, palpitations, significant peripheral edema, weakness, fatigue, vertiginous symptoms, or presyncope/syncope. Patient with a past medical history significant for cardiovascular diagnoses. Documented physical exam was grossly benign, providing no evidence of acute exacerbation and/or decompensation of the patient's known cardiovascular conditions.  The patient suffered an NSTEMI on 10/18/2021. Troponins were trended and peaked at a 22 ng/L.  Patient underwent diagnostic LEFT heart catheterization on 10/19/2021 that demonstrated a moderately reduced left ventricular systolic function with an EF of 35-45%. Study revealed multivessel CAD; 20% proximal RCA, 30% ostial-mid LM, 100% proximal LCx, 50% proximal LAD, and 40% D1.  PCI was subsequently performed placing a 2.5 x 22 mm Onyx Frontier DES x 1 to the proximal LCx lesion yielding excellent angiographic result and TIMI-3 flow.   Repeat diagnostic LEFT heart catheterization was performed on 07/10/2022 revealing an improved left ventricular systolic function with an EF of 50-55%.  Again, multivessel CAD was noted; 50% proximal LCx, 20% proximal RCA, 40% ostial-mid LM, 60% D1, and 20% mid RCA.  Previously placed stent to the LCx was widely patent.  No further intervention deemed necessary at that time.  Recommendations were for aggressive risk factor modification and medical management of patient's coronary artery disease.   Most recent myocardial perfusion imaging study was performed on 04/01/2024 revealing mildly reduced left ventricular systolic function with an EF of 48%.  There were no regional wall motion abnormalities.  No artifact .left ventricular cavity size was noted to be enlarged appreciated on review of imaging. SPECT images demonstrated a moderate mixed perfusion abnormality of mild intensity in the  inferior region on stress images consistent with scar. TID ratio = 1.10. Study determined to be normal and low risk.   Most recent  TTE performed on 07/15/2024 revealed a normal left ventricular systolic function with an EF of 55-60%. There were no regional wall motion abnormalities. Left ventricular diastolic Doppler parameters consistent with abnormal relaxation (G1DD). Right ventricular size and function normal with a TAPSE measuring 2.8 cm  (normal range >/= 1.6 cm). There was mild mitral and tricuspid valve regurgitation.  All transvalvular gradients were noted to be normal providing no evidence of hemodynamically significant valvular stenosis. Aorta normal in size with no evidence of ectasia or aneurysmal dilatation.  Patient with an atrial fibrillation diagnosis; CHA2DS2-VASc Score = 5 (age x 2 CHF, HTN, prior MI/vascular disease). His rate and rhythm are currently being maintained on oral metoprolol  succinate; not on chronic OAC. Following NSTEMI, patient remains on daily antithrombotic therapy using clopidogrel.  Patient reportedly compliant with therapy with no evidence of reports of GI/GU related bleeding. Blood pressure well controlled at 110/70 mmHg on currently prescribed beta-blocker (metoprolol  succinate) and diuretic (furosemide) therapies.  Patient is on rosuvastatin  for his HLD diagnosis and ASCVD prevention.  Patient has a prediabetes diagnosis that he is managing with diet and lifestyle modifications.  Most recent hemoglobin A1c was 5.7% when checked on 05/20/2024.  Functional capacity somewhat limited by patient's exertional dyspnea.  With that said, he is able to complete all of his ADLs/IADLs without significant cardiovascular limitation.  Per the [facility], patient is able to exceed 4 METS physical activity without experiencing any significant degree of angina/anginal equivalent symptoms. No changes were made to his medication regimen during his visit with cardiology.  Patient scheduled to  follow-up with outpatient cardiology in 3 months or sooner if needed.   Gary Mueller is scheduled for an elective ENUCLEATION, PROSTATE, USING LASER, WITH MORCELLATION on 10/27/2024 with Dr. Redell Burnet, MD. Given patient's past medical history significant for cardiovascular diagnoses, presurgical cardiac clearance was sought by the PAT team. Per cardiology, this patient is optimized for surgery and may proceed with the planned procedural course with a LOW risk of significant perioperative cardiovascular complications.  Patient has been prescribed clopidogrel, however due to GI bleeding, medication is on hold.  Patient recently seen by cardiology and  hold.  Was extended.  He is not on any other type of anticoagulation/antithrombotic therapies that need to be held during his perioperative course.  Patient denies previous perioperative complications with anesthesia in the past. In review his EMR, it is noted that patient underwent a general anesthetic course here at [facility] ([facility]) in 09/2024 without documented complications.   MOST RECENT VITAL SIGNS:    10/13/2024   11:07 AM 10/01/2024    9:20 AM 09/25/2024   11:46 AM  Vitals with BMI  Height 6' 0 6' 0   Weight 168 lbs 167 lbs 3 oz   BMI 22.78 22.67   Systolic 174 145 838  Diastolic 79 76 60  Pulse 52 65 61   PROVIDERS/SPECIALISTS: NOTE: Primary physician provider listed below. Patient may have been seen by APP or partner within same practice.   PROVIDER ROLE / SPECIALTY LAST SHERLEAN Burnet Redell JAYSON, MD Urology (Surgeon) 10/01/2024  Fernande Ophelia [name], MD Primary Care Provider 08/14/2024  Ammon Blunt, MD Cardiology 08/04/2024  Parris Manna, MD Pulmonary Medicine 05/26/2024  Tobie Solo, MD Rheumatology 05/05/2024   ALLERGIES: Allergies  Allergen Reactions   Codeine     GI intolerance; hallucinations    Statins     Muscle pain   Tamsulosin  Cough   CURRENT HOME  MEDICATIONS: No current facility-administered medications for this encounter.    amLODipine  (NORVASC ) 5 MG tablet   ascorbic acid  (VITAMIN C) 1000 MG tablet   cyanocobalamin  1000 MCG tablet   levothyroxine  (SYNTHROID ) 75 MCG tablet   metoprolol  succinate (TOPROL -XL) 25 MG 24 hr tablet   Multiple Vitamin (MULTIVITAMIN WITH MINERALS) TABS tablet   pantoprazole  (PROTONIX ) 40 MG tablet   Polyethyl Glycol-Propyl Glycol (SYSTANE) 0.4-0.3 % SOLN   rosuvastatin  (CRESTOR ) 40 MG tablet   sulfamethoxazole -trimethoprim  (BACTRIM  DS) 800-160 MG tablet   HISTORY: Past Medical History:  Diagnosis Date   Acute gastric ulcer with hemorrhage    Allergy    Anemia    Anxiety    Aortic atherosclerosis    Arthritis    Atrial fibrillation (HCC)    a.) CHA2DS2VASc = 5 (age x 2, CHF, HTN, prior MI/vascular disease) as of 07/25/2024; b.) rate/rhythm maintained on oral metoprolol  succinate; no OAC (does take clopidogrel)   BILATERAL recurrent inguinal hernia    a.) s/p repair RIGHT 02/1996; b.) s/p repair LEFT 03/1996; c.) s/p repair recurrent LEFT 07/2002; d.) planned repair of second recurrence on LEFT 07/2024   BPH (benign prostatic hyperplasia)    CHF (congestive heart failure) (HCC)    CKD stage 3a, GFR 45-59 ml/min (HCC)    Colon polyps    Coronary artery disease    a.) s/p NSTEMI 10/18/2021 with PCI of LCx 10/19/2021 (2.5 x 22 mm Onyx Frontier DES)   Cystoid macular edema of left eye 07/07/2019   Depression    Diverticulosis    DOE (dyspnea on exertion)    Exudative age related macular degeneration (HCC)    GERD (gastroesophageal reflux disease)    HOH (hard of hearing)    Hyperlipidemia    Hypertension    Hypothyroidism    IBS (irritable bowel syndrome)    Ischemic cardiomyopathy    Levoscoliosis of lumbar spine    Long term current use of clopidogrel    NSTEMI (non-ST elevated myocardial infarction) (HCC) 10/18/2021   a.) Troponin peaked at 822 ng/L; b.) LHC 10/19/2021: subtotal  occlusion of the LCx (2.5 x 22 mm Onyx Frontier DES)   Pneumonia    Pre-diabetes    RBBB    Right bundle branch block (RBBB)    Sepsis secondary to UTI (HCC)    Skin cancer, basal cell 10/20/2016   a.) lip, neck   Status post bilateral cataract extraction 2019   Symptomatic PVCs    Tinnitus of both ears    Ventral hernia    Past Surgical History:  Procedure Laterality Date   ANTERIOR VITRECTOMY Left 11/28/2018   Procedure: ANTERIOR VITRECTOMY;  Surgeon: Ferol Rogue, MD;  Location: [facility] ORS;  Service: Ophthalmology;  Laterality: Left;   CATARACT EXTRACTION W/PHACO Right 10/10/2018   Procedure: CATARACT EXTRACTION PHACO AND INTRAOCULAR LENS PLACEMENT (IOC);  Surgeon: Ferol Rogue, MD;  Location: [facility] ORS;  Service: Ophthalmology;  Laterality: Right;  US  00:41 CDE 6.87 Fluid pack lot # [id number]   CATARACT EXTRACTION W/PHACO Left 11/28/2018   Procedure: CATARACT EXTRACTION PHACO AND INTRAOCULAR LENS PLACEMENT (IOC)-LEFT;  Surgeon: Ferol Rogue, MD;  Location: [facility] ORS;  Service: Ophthalmology;  Laterality: Left;  US  00:47.7 CDE 8.92 Fluid Pack lot # [id number]   CHOLECYSTECTOMY  1968   CHONDROPLASTY Right 01/02/2018   Procedure: CHONDROPLASTY;  Surgeon: Mardee Lynwood SQUIBB, MD;  Location: [facility] ORS;  Service: Orthopedics;  Laterality: Right;   COLONOSCOPY  2014   COLONOSCOPY N/A 07/13/2024   Procedure: COLONOSCOPY;  Surgeon: Unk Corinn Skiff, MD;  Location: [facility] ENDOSCOPY;  Service: Gastroenterology;  Laterality: N/A;   CORONARY STENT INTERVENTION N/A 10/19/2021   Procedure: CORONARY STENT INTERVENTION;  Surgeon: Ammon Blunt, MD;  Location: [facility] INVASIVE CV LAB;  Service: Cardiovascular;  Laterality: N/A;   ESOPHAGOGASTRODUODENOSCOPY N/A 07/13/2024   Procedure: EGD (ESOPHAGOGASTRODUODENOSCOPY);  Surgeon: Unk Corinn Skiff, MD;  Location: [facility] ENDOSCOPY;  Service: Gastroenterology;  Laterality: N/A;   HERNIA REPAIR Left 04/18/1996   inguinal hernia repair/ Dr Dessa    HERNIA REPAIR Right 02/26/1996   Dr Dessa   HERNIA REPAIR Left 08/07/2002   Dr Dessa   HERNIORRHAPHY, INGUINAL, ROBOT-ASSISTED, LAPAROSCOPIC Left 07/29/2024   Procedure: HERNIORRHAPHY, INGUINAL, ROBOT-ASSISTED, LAPAROSCOPIC;  Surgeon: Jordis Laneta FALCON, MD;  Location: [facility] ORS;  Service: General;  Laterality: Left;   INSERTION OF MESH  07/29/2024   Procedure: INSERTION OF MESH;  Surgeon: Jordis Laneta FALCON, MD;  Location: [facility] ORS;  Service: General;;   INSERTION OF MESH  09/25/2024   Procedure: INSERTION OF MESH;  Surgeon: Jordis Laneta FALCON, MD;  Location: [facility] ORS;  Service: General;;   JOINT REPLACEMENT     KNEE ARTHROPLASTY Right 04/30/2020   Procedure: COMPUTER ASSISTED TOTAL KNEE ARTHROPLASTY;  Surgeon: Mardee Lynwood SQUIBB, MD;  Location: [facility] ORS;  Service: Orthopedics;  Laterality: Right;   KNEE ARTHROSCOPY Right 01/02/2018   Procedure: ARTHROSCOPY KNEE;  Surgeon: Mardee Lynwood SQUIBB, MD;  Location: [facility] ORS;  Service: Orthopedics;  Laterality: Right;   KNEE ARTHROSCOPY Left 02/14/2012   partial menisectomy and chondroplasty   KNEE ARTHROSCOPY WITH MEDIAL MENISECTOMY Right 01/02/2018   Procedure: KNEE ARTHROSCOPY WITH MEDIAL MENISECTOMY;  Surgeon: Mardee Lynwood SQUIBB, MD;  Location: [facility] ORS;  Service: Orthopedics;  Laterality: Right;   LEFT HEART CATH AND CORONARY ANGIOGRAPHY N/A 10/19/2021   Procedure: LEFT HEART CATH AND CORONARY ANGIOGRAPHY;  Surgeon: Ammon Blunt, MD;  Location: [facility] INVASIVE CV LAB;  Service: Cardiovascular;  Laterality: N/A;   LEFT HEART CATH AND CORONARY ANGIOGRAPHY Left 10/10/2022   Procedure: LEFT HEART CATH AND CORONARY ANGIOGRAPHY;  Surgeon: Ammon Blunt, MD;  Location: [facility] INVASIVE CV LAB;  Service: Cardiovascular;  Laterality: Left;   POLYPECTOMY  07/13/2024   Procedure: POLYPECTOMY, INTESTINE;  Surgeon: Unk Corinn Skiff, MD;  Location: [facility] ENDOSCOPY;  Service: Gastroenterology;;   TONSILLECTOMY     as a child   TOTAL KNEE ARTHROPLASTY Left 03/29/2015    [facility] Dr. Mardee   VASECTOMY     VENTRAL HERNIA REPAIR N/A 09/25/2024   Procedure: REPAIR, HERNIA, VENTRAL;  Surgeon: Jordis Laneta FALCON, MD;  Location: [facility] ORS;  Service: General;  Laterality: N/A;  open proceduren   Family History  Problem Relation Age of Onset   Atrial fibrillation Mother    Healthy Sister    Social History   Tobacco Use   Smoking status: Former    Current packs/day: 0.00    Types: Cigarettes    Quit date: 12/24/1964    Years since quitting: 59.8   Smokeless tobacco: Never  Substance Use Topics   Alcohol  use: Yes    Comment: 0-1 beer or glass of wine monthly   LABS:  Office Visit on 10/01/2024  Component Date Value Ref Range Status   Specific Gravity, UA 10/01/2024 1.015  1.005 - 1.030 Final   pH, UA 10/01/2024 6.0  5.0 - 7.5 Final   Color, UA 10/01/2024 Yellow  Yellow Final   Appearance Ur 10/01/2024 Clear  Clear Final   Leukocytes,UA 10/01/2024 1+ (A)  Negative Final  Protein,UA 10/01/2024 1+ (A)  Negative/Trace Final   Glucose, UA 10/01/2024 Negative  Negative Final   Ketones, UA 10/01/2024 Negative  Negative Final   RBC, UA 10/01/2024 1+ (A)  Negative Final   Bilirubin, UA 10/01/2024 Negative  Negative Final   Urobilinogen, Ur 10/01/2024 0.2  0.2 - 1.0 mg/dL Final   Nitrite, UA 89/91/7974 Negative  Negative Final   Microscopic Examination 10/01/2024 See below:   Final   Microscopic was indicated and was performed.   Urine Culture, Comprehensive 10/01/2024 Final report (A)   Final   Organism ID, Bacteria 10/01/2024 Klebsiella oxytoca (A)   Final   Greater than 100,000 colony forming units per mL   Organism ID, Bacteria 10/01/2024 Not applicable   Final   Organism ID, Bacteria 10/01/2024 Not applicable   Final   ANTIMICROBIAL SUSCEPTIBILITY 10/01/2024 Comment   Final   Comment:       ** S = Susceptible; I = Intermediate; R = Resistant **                    P = Positive; N = Negative             MICS are expressed in micrograms per mL    Antibiotic                  RSLT#1    RSLT#2    RSLT#3    RSLT#4 Amoxicillin /Clavulanic Acid    S Ampicillin                     R Cefepime                       S Cefoxitin                      S Cefpodoxime                    S Ceftriaxone                     S Ciprofloxacin                  S Ertapenem                      S Gentamicin                     S Levofloxacin                   S Meropenem                      S Nitrofurantoin                 S Tetracycline                   S Tobramycin                     S Trimethoprim /Sulfa              S    WBC, UA 10/01/2024 >30 (A)  0 - 5 /hpf Final   RBC, Urine 10/01/2024 11-30 (A)  0 - 2 /hpf Final   Epithelial Cells (non renal) 10/01/2024 0-10  0 - 10 /hpf Final   Casts 10/01/2024 Present (A)  None seen /lpf Final   Cast Type  10/01/2024 Hyaline casts  N/A Final   Bacteria, UA 10/01/2024 Many (A)  None seen/Few Final   Lab Results  Component Value Date   WBC 5.8 09/22/2024   HGB 11.4 (L) 09/22/2024   HCT 36.4 (L) 09/22/2024   MCV 97.1 09/22/2024   PLT 178 09/22/2024   Lab Results  Component Value Date   NA 135 08/07/2024   CL 106 08/07/2024   K 3.7 08/07/2024   CO2 22 08/07/2024   BUN 28 (H) 08/07/2024   CREATININE 1.30 (H) 08/08/2024   GFRNONAA 54 (L) 08/08/2024   CALCIUM  8.0 (L) 08/07/2024   ALBUMIN 3.7 08/05/2024   GLUCOSE 125 (H) 08/07/2024    ECG: Date: 07/15/2024  Time ECG obtained: 1013 AM Rate: 67 bpm Rhythm: normal sinus; RBBB Axis (leads I and aVF): left Intervals: PR 192 ms. QRS 146 ms. QTc 469 ms. ST segment and T wave changes: No evidence of acute T wave abnormalities or significant ST segment elevation or depression.  Evidence of a possible, age undetermined, prior infarct:  No Comparison: Similar to previous tracing obtained on 07/11/2024   IMAGING / PROCEDURES: CT ABDOMEN PELVIS W CONTRAST performed on 08/05/2024 6.7 x 2.9 cm fluid collection is noted in left inguinal canal most consistent with  postoperative seroma. Small fat containing supraumbilical ventral hernia is noted. Stable moderate prostatic enlargement. Aortic atherosclerosis  TRANSTHORACIC ECHOCARDIOGRAM performed on 07/15/2024 Left ventricular ejection fraction, by estimation, is 55 to 60%. The left ventricle has normal function. The left ventricle has no regional wall motion abnormalities. Left ventricular diastolic parameters are consistent with Grade I diastolic dysfunction (impaired relaxation).  Right ventricular systolic function is normal. The right ventricular size is normal.  The mitral valve is normal in structure. Mild mitral valve regurgitation. No evidence of mitral stenosis.  The aortic valve is normal in structure. Aortic valve regurgitation is not visualized. No aortic stenosis is present.  The inferior vena cava is normal in size with greater than 50% respiratory variability, suggesting right atrial pressure of 3 mmHg.    CT ANGIO GI BLEED performed on 07/12/2024 No evidence of active GI bleed. Sigmoid diverticulosis. Left anterolateral urinary bladder mass or diverticulum extending into the left inguinal hernia. Consider urologic consultation. Probable AVN in the right femoral head without subchondral collapse. Aortic atherosclerosis   LEFT HEART CATHETERIZATION AND CORONARY ANGIOGRAPHY performed on 10/10/2022 Low normal left ventricular systolic function with an EF of 50-55% Multivessel CAD Prox LAD lesion is 50% stenosed. Prox RCA lesion is 20% stenosed. Ost LM to Mid LM lesion is 40% stenosed. 1st Diag lesion is 60% stenosed. Mid RCA lesion is 20% stenosed. Non-stenotic Prox Cx lesion was previously treated. Recommendations Medical therapy. Aggressive risk factor modification    IMPRESSION AND PLAN: Gary Mueller has been referred for pre-anesthesia review and clearance prior to him undergoing the planned anesthetic and procedural courses. Available labs, pertinent testing, and imaging  results were personally reviewed by me in preparation for upcoming operative/procedural course. [facility] medical record has been updated following extensive record review and patient interview with PAT staff.   This patient has been appropriately cleared by cardiology with an overall LOW risk of patient experiencing significant perioperative cardiovascular complications. Based on clinical review performed today (10/22/24), barring any significant acute changes in the patient's overall condition, it is anticipated that he will be able to proceed with the planned surgical intervention. Any acute changes in clinical condition may necessitate his procedure being postponed and/or cancelled. Patient will meet  with anesthesia team (MD and/or CRNA) on the day of his procedure for preoperative evaluation/assessment. Questions regarding anesthetic course will be fielded at that time.   Pre-surgical instructions were reviewed with the patient during his PAT appointment, and questions were fielded to satisfaction by PAT clinical staff. He has been instructed on which medications that he will need to hold prior to surgery, as well as the ones that have been deemed safe/appropriate to take on the day of his procedure. As part of the general education provided by PAT, patient made aware both verbally and in writing, that he would need to abstain from the use of any illegal substances during his perioperative course. He was advised that failure to follow the provided instructions could necessitate case cancellation or result in serious perioperative complications up to and including death. Patient encouraged to contact PAT and/or his surgeon's office to discuss any questions or concerns that may arise prior to surgery; verbalized understanding.   Dorise Pereyra, MSN, APRN, FNP-C, CEN [facility]  Perioperative Services Nurse Practitioner Phone: [phone] Fax: [phone] 10/22/24 9:59 AM  NOTE:  This note has been prepared using Dragon dictation software. Despite my best ability to proofread, there is always the potential that unintentional transcriptional errors may still occur from this process.

## 2024-10-27 ENCOUNTER — Ambulatory Visit: Payer: Self-pay | Admitting: Urgent Care

## 2024-10-27 ENCOUNTER — Ambulatory Visit
Admission: RE | Admit: 2024-10-27 | Discharge: 2024-10-27 | Disposition: A | Discharge status: Home / Self Care | Attending: Urology | Admitting: Urology

## 2024-10-27 ENCOUNTER — Other Ambulatory Visit: Payer: Self-pay

## 2024-10-27 ENCOUNTER — Encounter
Admission: RE | Disposition: A | Discharge status: Home / Self Care | Payer: Self-pay | Source: Home / Self Care | Attending: Urology

## 2024-10-27 ENCOUNTER — Encounter: Payer: Self-pay | Admitting: Urology

## 2024-10-27 DIAGNOSIS — D649 Anemia, unspecified: Secondary | ICD-10-CM | POA: Diagnosis not present

## 2024-10-27 DIAGNOSIS — K279 Peptic ulcer, site unspecified, unspecified as acute or chronic, without hemorrhage or perforation: Secondary | ICD-10-CM | POA: Diagnosis not present

## 2024-10-27 DIAGNOSIS — K219 Gastro-esophageal reflux disease without esophagitis: Secondary | ICD-10-CM | POA: Diagnosis not present

## 2024-10-27 DIAGNOSIS — I252 Old myocardial infarction: Secondary | ICD-10-CM | POA: Insufficient documentation

## 2024-10-27 DIAGNOSIS — I4891 Unspecified atrial fibrillation: Secondary | ICD-10-CM | POA: Diagnosis not present

## 2024-10-27 DIAGNOSIS — R338 Other retention of urine: Secondary | ICD-10-CM | POA: Diagnosis not present

## 2024-10-27 DIAGNOSIS — N411 Chronic prostatitis: Secondary | ICD-10-CM | POA: Diagnosis not present

## 2024-10-27 DIAGNOSIS — N138 Other obstructive and reflux uropathy: Secondary | ICD-10-CM | POA: Diagnosis present

## 2024-10-27 DIAGNOSIS — N401 Enlarged prostate with lower urinary tract symptoms: Secondary | ICD-10-CM | POA: Diagnosis not present

## 2024-10-27 DIAGNOSIS — E039 Hypothyroidism, unspecified: Secondary | ICD-10-CM | POA: Diagnosis not present

## 2024-10-27 DIAGNOSIS — M1990 Unspecified osteoarthritis, unspecified site: Secondary | ICD-10-CM | POA: Diagnosis not present

## 2024-10-27 DIAGNOSIS — Z7902 Long term (current) use of antithrombotics/antiplatelets: Secondary | ICD-10-CM | POA: Diagnosis not present

## 2024-10-27 DIAGNOSIS — I2510 Atherosclerotic heart disease of native coronary artery without angina pectoris: Secondary | ICD-10-CM | POA: Diagnosis not present

## 2024-10-27 DIAGNOSIS — Z87891 Personal history of nicotine dependence: Secondary | ICD-10-CM | POA: Insufficient documentation

## 2024-10-27 DIAGNOSIS — F419 Anxiety disorder, unspecified: Secondary | ICD-10-CM | POA: Insufficient documentation

## 2024-10-27 DIAGNOSIS — I129 Hypertensive chronic kidney disease with stage 1 through stage 4 chronic kidney disease, or unspecified chronic kidney disease: Secondary | ICD-10-CM | POA: Insufficient documentation

## 2024-10-27 HISTORY — PX: HOLEP-LASER ENUCLEATION OF THE PROSTATE WITH MORCELLATION: SHX6641

## 2024-10-27 SURGERY — ENUCLEATION, PROSTATE, USING LASER, WITH MORCELLATION
Anesthesia: General | Site: Prostate

## 2024-10-27 MED ORDER — LIDOCAINE HCL (CARDIAC) PF 100 MG/5ML IV SOSY
PREFILLED_SYRINGE | INTRAVENOUS | Status: DC | PRN
  Administered 2024-10-27: 60 mg via INTRAVENOUS

## 2024-10-27 MED ORDER — STERILE WATER FOR IRRIGATION IR SOLN
Status: DC | PRN
  Administered 2024-10-27: 1000 mL

## 2024-10-27 MED ORDER — FENTANYL CITRATE (PF) 100 MCG/2ML IJ SOLN
INTRAMUSCULAR | Status: DC | PRN
  Administered 2024-10-27 (×2): 25 ug via INTRAVENOUS
  Administered 2024-10-27: 50 ug via INTRAVENOUS

## 2024-10-27 MED ORDER — ACETAMINOPHEN 10 MG/ML IV SOLN
INTRAVENOUS | Status: DC | PRN
  Administered 2024-10-27: 1000 mg via INTRAVENOUS

## 2024-10-27 MED ORDER — PROPOFOL 1000 MG/100ML IV EMUL
INTRAVENOUS | Status: AC
  Filled 2024-10-27: qty 100

## 2024-10-27 MED ORDER — ACETAMINOPHEN 10 MG/ML IV SOLN
INTRAVENOUS | Status: AC
  Filled 2024-10-27: qty 100

## 2024-10-27 MED ORDER — FENTANYL CITRATE (PF) 100 MCG/2ML IJ SOLN
INTRAMUSCULAR | Status: AC
  Filled 2024-10-27: qty 2

## 2024-10-27 MED ORDER — ROCURONIUM BROMIDE 100 MG/10ML IV SOLN
INTRAVENOUS | Status: DC | PRN
  Administered 2024-10-27: 40 mg via INTRAVENOUS

## 2024-10-27 MED ORDER — ONDANSETRON HCL 4 MG/2ML IJ SOLN
INTRAMUSCULAR | Status: DC | PRN
  Administered 2024-10-27: 4 mg via INTRAVENOUS

## 2024-10-27 MED ORDER — CHLORHEXIDINE GLUCONATE 0.12 % MT SOLN
OROMUCOSAL | Status: AC
Start: 2024-10-27 — End: 2024-10-27
  Filled 2024-10-27: qty 15

## 2024-10-27 MED ORDER — LACTATED RINGERS IV SOLN: INTRAVENOUS | Status: DC

## 2024-10-27 MED ORDER — SUGAMMADEX SODIUM 200 MG/2ML IV SOLN
INTRAVENOUS | Status: DC | PRN
  Administered 2024-10-27: 200 mg via INTRAVENOUS

## 2024-10-27 MED ORDER — CIPROFLOXACIN IN D5W 400 MG/200ML IV SOLN
INTRAVENOUS | Status: AC
  Filled 2024-10-27: qty 200

## 2024-10-27 MED ORDER — CHLORHEXIDINE GLUCONATE 0.12 % MT SOLN
15.0000 mL | Freq: Once | OROMUCOSAL | Status: AC
  Administered 2024-10-27: 15 mL via OROMUCOSAL

## 2024-10-27 MED ORDER — DEXAMETHASONE SOD PHOSPHATE PF 10 MG/ML IJ SOLN
INTRAMUSCULAR | Status: DC | PRN
  Administered 2024-10-27: 5 mg via INTRAVENOUS

## 2024-10-27 MED ORDER — ONDANSETRON HCL 4 MG/2ML IJ SOLN: 4.0000 mg | Freq: Once | INTRAMUSCULAR | Status: DC | PRN

## 2024-10-27 MED ORDER — FENTANYL CITRATE (PF) 100 MCG/2ML IJ SOLN: 25.0000 ug | INTRAMUSCULAR | Status: DC | PRN

## 2024-10-27 MED ORDER — TRAMADOL HCL 50 MG PO TABS: 25.0000 mg | ORAL_TABLET | Freq: Four times a day (QID) | ORAL | 0 refills | Status: AC | PRN

## 2024-10-27 MED ORDER — SODIUM CHLORIDE 0.9 % IR SOLN
Status: DC | PRN
  Administered 2024-10-27: 15000 mL
  Administered 2024-10-27: 6000 mL

## 2024-10-27 MED ORDER — CIPROFLOXACIN IN D5W 400 MG/200ML IV SOLN
400.0000 mg | INTRAVENOUS | Status: AC
  Administered 2024-10-27: 400 mg via INTRAVENOUS

## 2024-10-27 MED ORDER — ORAL CARE MOUTH RINSE: 15.0000 mL | Freq: Once | OROMUCOSAL | Status: AC

## 2024-10-27 MED ORDER — PROPOFOL 10 MG/ML IV BOLUS
INTRAVENOUS | Status: DC | PRN
  Administered 2024-10-27: 80 mg via INTRAVENOUS

## 2024-10-27 SURGICAL SUPPLY — 24 items
ADAPTER IRRIG TUBE 2 SPIKE SOL (ADAPTER) ×2 IMPLANT
BAG URO DRAIN 4000ML (MISCELLANEOUS) ×1 IMPLANT
CATH URETL OPEN END 4X70 (CATHETERS) ×1 IMPLANT
CATH URTH STD 24FR FL 3W 2 (CATHETERS) ×1 IMPLANT
CONTAINER COLLECT MORCELLATR (MISCELLANEOUS) ×1 IMPLANT
DRAPE UTILITY 15X26 TOWEL STRL (DRAPES) IMPLANT
FIBER LASER MOSES 550 DFL (Laser) ×1 IMPLANT
FILTER OVERFLOW MORCELLATOR (FILTER) ×1 IMPLANT
GLOVE BIOGEL PI IND STRL 7.5 (GLOVE) ×1 IMPLANT
GOWN STRL REUS W/ TWL LRG LVL3 (GOWN DISPOSABLE) ×1 IMPLANT
GOWN STRL REUS W/ TWL XL LVL3 (GOWN DISPOSABLE) ×1 IMPLANT
HOLDER FOLEY CATH W/STRAP (MISCELLANEOUS) ×1 IMPLANT
KIT TURNOVER CYSTO (KITS) ×1 IMPLANT
MEMBRANE SLNG YLW 17 FOR INST (MISCELLANEOUS) ×1 IMPLANT
MORCELLATOR ROTATION 4.75 335 (MISCELLANEOUS) ×1 IMPLANT
PACK CYSTO AR (MISCELLANEOUS) ×1 IMPLANT
SET CYSTO IRRIGATION (SET/KITS/TRAYS/PACK) ×1 IMPLANT
SET IRRIG Y TYPE TUR BLADDER L (SET/KITS/TRAYS/PACK) ×1 IMPLANT
SLEEVE PROTECTION STRL DISP (MISCELLANEOUS) ×2 IMPLANT
SOL .9 NS 3000ML IRR UROMATIC (IV SOLUTION) ×5 IMPLANT
SOLN STERILE WATER BTL 1000 ML (IV SOLUTION) ×1 IMPLANT
SURGILUBE 2OZ TUBE FLIPTOP (MISCELLANEOUS) ×1 IMPLANT
SYRINGE TOOMEY IRRIG 70ML (MISCELLANEOUS) ×1 IMPLANT
TUBE PUMP MORCELLATOR PIRANHA (TUBING) ×1 IMPLANT

## 2024-10-27 NOTE — Op Note (Signed)
 Date of procedure: 10/27/24  Preoperative diagnosis:  BPH with retention  Postoperative diagnosis:  Same  Procedure: HoLEP (Holmium Laser Enucleation of the Prostate)  Surgeon: Redell Burnet, MD  Anesthesia: General  Complications: None  Intraoperative findings:  Large prostate with obstructing lateral lobes, ureteral orifices orthotopic bilaterally, moderate to severe bladder trabeculations, mild posterior wall catheter cystitis, no suspicious lesions Uncomplicated HOLEP, good hemostasis, ureteral orifices and verumontanum intact at conclusion of case  EBL: 20ml  Specimens: Prostate chips  Enucleation time: 16 minutes  Morcellation time: 10 minutes  Intra-op weight: 50g  Drains: 24 French three-way, 60 cc in balloon  Indication: [name] is a 83 y.o. patient with Foley dependent urinary retention who opted for HOLEP.  After reviewing the management options for treatment, they elected to proceed with the above surgical procedure(s). We have discussed the potential benefits and risks of the procedure, side effects of the proposed treatment, the likelihood of the patient achieving the goals of the procedure, and any potential problems that might occur during the procedure or recuperation.  We specifically discussed the risks of bleeding, infection, hematuria and clot retention, need for additional procedures, possible overnight hospital stay, temporary urgency and incontinence, rare long-term incontinence, and retrograde ejaculation.  Informed consent has been obtained.   Description of procedure:  The patient was taken to the operating room and general anesthesia was induced.  The patient was placed in the dorsal lithotomy position, prepped and draped in the usual sterile fashion, and preoperative antibiotics(Cipro) were administered.  SCDs were placed for DVT prophylaxis.  A preoperative time-out was performed.   Fleeta Needs sounds were used to gently dilated the urethra up  to 67F. The 108 French continuous flow resectoscope was inserted into the urethra using the visual obturator  The prostate was large with obstructing lateral lobes. The bladder was thoroughly inspected and notable for moderate to severe trabeculations, mild posterior wall catheter cystitis, no suspicious lesions.  The ureteral orifices were located in orthotopic position.    The laser was set to 2 J and 60 Hz and early apical release was performed by making a circumferential mucosal incision proximal to the sphincter.  A lambda incision was then made proximal to the verumontanum.  The prostate was enucleated en bloc circumferentially into the bladder.  The capsule was examined and laser was used for meticulous hemostasis.    The 26 French resectoscope was then switched out for the 26 French nephroscope and prostate tissue was morcellated(Piranha) and the tissue sent to pathology.  A 24 French three-way catheter was inserted easily with the aid of a catheter guide, and 60 cc were placed in the balloon.  Urine was pink.  The catheter irrigated easily with a Toomey syringe.  CBI was initiated.   The patient tolerated the procedure well without any immediate complications and was extubated and transferred to the recovery room in stable condition.  Urine was clear on fast CBI.  Disposition: Stable to PACU  Plan: Wean CBI in PACU, anticipate discharge home today with Foley removal in clinic in 2-3 days  Redell Burnet, MD 10/27/2024

## 2024-10-27 NOTE — Anesthesia Preprocedure Evaluation (Signed)
 Anesthesia Evaluation  Patient identified by MRN, date of birth, ID band Patient awake    Reviewed: Allergy & Precautions, NPO status , Patient's Chart, lab work & pertinent test results  Airway Mallampati: II  TM Distance: >3 FB Neck ROM: Full    Dental  (+) Teeth Intact   Pulmonary neg pulmonary ROS, Patient abstained from smoking., former smoker   Pulmonary exam normal        Cardiovascular Exercise Tolerance: Good hypertension, Pt. on medications + CAD, + Past MI and + Cardiac Stents  negative cardio ROS Normal cardiovascular exam+ dysrhythmias  Rhythm:Regular Rate:Normal     Neuro/Psych   Anxiety     negative neurological ROS  negative psych ROS   GI/Hepatic negative GI ROS, Neg liver ROS, PUD,GERD  Medicated,,  Endo/Other  negative endocrine ROSHypothyroidism    Renal/GU      Musculoskeletal  (+) Arthritis ,    Abdominal Normal abdominal exam  (+)   Peds negative pediatric ROS (+)  Hematology negative hematology ROS (+) Blood dyscrasia, anemia   Anesthesia Other Findings Past Medical History: No date: Acute gastric ulcer with hemorrhage No date: Allergy No date: Anemia No date: Anxiety No date: Aortic atherosclerosis No date: Arthritis No date: Atrial fibrillation [facility])     Comment:  a.) CHA2DS2VASc = 5 (age x 2, CHF, HTN, prior               MI/vascular disease) as of 07/25/2024; b.) rate/rhythm               maintained on oral metoprolol  succinate; no OAC (does               take clopidogrel) No date: BILATERAL recurrent inguinal hernia     Comment:  a.) s/p repair RIGHT 02/1996; b.) s/p repair LEFT               03/1996; c.) s/p repair recurrent LEFT 07/2002; d.)               planned repair of second recurrence on LEFT 07/2024 No date: BPH (benign prostatic hyperplasia) No date: CHF (congestive heart failure) (HCC) No date: CKD stage 3a, GFR 45-59 ml/min (HCC) No date: Colon polyps No date:  Coronary artery disease     Comment:  a.) s/p NSTEMI 10/18/2021 with PCI of LCx 10/19/2021               (2.5 x 22 mm Onyx Frontier DES) 07/07/2019: Cystoid macular edema of left eye No date: Depression No date: Diverticulosis No date: DOE (dyspnea on exertion) No date: Exudative age related macular degeneration (HCC) No date: GERD (gastroesophageal reflux disease) No date: HOH (hard of hearing) No date: Hyperlipidemia No date: Hypertension No date: Hypothyroidism No date: IBS (irritable bowel syndrome) No date: Ischemic cardiomyopathy No date: Levoscoliosis of lumbar spine No date: Long term current use of clopidogrel 10/18/2021: NSTEMI (non-ST elevated myocardial infarction) (HCC)     Comment:  a.) Troponin peaked at 822 ng/L; b.) LHC 10/19/2021:               subtotal occlusion of the LCx (2.5 x 22 mm Onyx Frontier               DES) No date: Pneumonia No date: Pre-diabetes No date: RBBB No date: Right bundle branch block (RBBB) No date: Sepsis secondary to UTI (HCC) 10/20/2016: Skin cancer, basal cell     Comment:  a.) lip, neck 2019: Status post bilateral cataract  extraction No date: Symptomatic PVCs No date: Tinnitus of both ears No date: Ventral hernia  Past Surgical History: 11/28/2018: ANTERIOR VITRECTOMY; Left     Comment:  Procedure: ANTERIOR VITRECTOMY;  Surgeon: Ferol Rogue,              MD;  Location: [facility] ORS;  Service: Ophthalmology;                Laterality: Left; 10/10/2018: CATARACT EXTRACTION W/PHACO; Right     Comment:  Procedure: CATARACT EXTRACTION PHACO AND INTRAOCULAR               LENS PLACEMENT (IOC);  Surgeon: Ferol Rogue, MD;                Location: [facility] ORS;  Service: Ophthalmology;  Laterality:              Right;  US  00:41 CDE 6.87 Fluid pack lot # [id number] 11/28/2018: CATARACT EXTRACTION W/PHACO; Left     Comment:  Procedure: CATARACT EXTRACTION PHACO AND INTRAOCULAR               LENS PLACEMENT (IOC)-LEFT;  Surgeon: Ferol Rogue,  MD;                Location: [facility] ORS;  Service: Ophthalmology;  Laterality:              Left;  US  00:47.7 CDE 8.92 Fluid Pack lot # [id number] 1968: CHOLECYSTECTOMY 01/02/2018: CHONDROPLASTY; Right     Comment:  Procedure: CHONDROPLASTY;  Surgeon: Mardee Lynwood SQUIBB, MD;              Location: [facility] ORS;  Service: Orthopedics;  Laterality:               Right; 2014: COLONOSCOPY 07/13/2024: COLONOSCOPY; N/A     Comment:  Procedure: COLONOSCOPY;  Surgeon: Unk Corinn Skiff,               MD;  Location: [facility] ENDOSCOPY;  Service:               Gastroenterology;  Laterality: N/A; 10/19/2021: CORONARY STENT INTERVENTION; N/A     Comment:  Procedure: CORONARY STENT INTERVENTION;  Surgeon:               Ammon Blunt, MD;  Location: [facility] INVASIVE CV               LAB;  Service: Cardiovascular;  Laterality: N/A; 07/13/2024: ESOPHAGOGASTRODUODENOSCOPY; N/A     Comment:  Procedure: EGD (ESOPHAGOGASTRODUODENOSCOPY);  Surgeon:               Unk Corinn Skiff, MD;  Location: [facility] ENDOSCOPY;                Service: Gastroenterology;  Laterality: N/A; 04/18/1996: HERNIA REPAIR; Left     Comment:  inguinal hernia repair/ Dr Dessa 02/26/1996: HERNIA REPAIR; Right     Comment:  Dr Dessa 08/07/2002: HERNIA REPAIR; Left     Comment:  Dr Dessa 07/29/2024: HERNIORRHAPHY, INGUINAL, ROBOT-ASSISTED, LAPAROSCOPIC; Left     Comment:  Procedure: HERNIORRHAPHY, INGUINAL, ROBOT-ASSISTED,               LAPAROSCOPIC;  Surgeon: Jordis Laneta FALCON, MD;  Location:               [facility] ORS;  Service: General;  Laterality: Left; 07/29/2024: INSERTION OF MESH     Comment:  Procedure: INSERTION OF MESH;  Surgeon: Jordis Laneta FALCON,  MD;  Location: [facility] ORS;  Service: General;; 09/25/2024: INSERTION OF MESH     Comment:  Procedure: INSERTION OF MESH;  Surgeon: Jordis Laneta FALCON,               MD;  Location: [facility] ORS;  Service: General;; No date: JOINT REPLACEMENT 04/30/2020: KNEE ARTHROPLASTY; Right      Comment:  Procedure: COMPUTER ASSISTED TOTAL KNEE ARTHROPLASTY;                Surgeon: Mardee Lynwood SQUIBB, MD;  Location: [facility] ORS;                Service: Orthopedics;  Laterality: Right; 01/02/2018: KNEE ARTHROSCOPY; Right     Comment:  Procedure: ARTHROSCOPY KNEE;  Surgeon: Mardee Lynwood SQUIBB,               MD;  Location: [facility] ORS;  Service: Orthopedics;                Laterality: Right; 02/14/2012: KNEE ARTHROSCOPY; Left     Comment:  partial menisectomy and chondroplasty 01/02/2018: KNEE ARTHROSCOPY WITH MEDIAL MENISECTOMY; Right     Comment:  Procedure: KNEE ARTHROSCOPY WITH MEDIAL MENISECTOMY;                Surgeon: Mardee Lynwood SQUIBB, MD;  Location: [facility] ORS;                Service: Orthopedics;  Laterality: Right; 10/19/2021: LEFT HEART CATH AND CORONARY ANGIOGRAPHY; N/A     Comment:  Procedure: LEFT HEART CATH AND CORONARY ANGIOGRAPHY;                Surgeon: Ammon Blunt, MD;  Location: [facility]               INVASIVE CV LAB;  Service: Cardiovascular;  Laterality:               N/A; 10/10/2022: LEFT HEART CATH AND CORONARY ANGIOGRAPHY; Left     Comment:  Procedure: LEFT HEART CATH AND CORONARY ANGIOGRAPHY;                Surgeon: Ammon Blunt, MD;  Location: [facility]               INVASIVE CV LAB;  Service: Cardiovascular;  Laterality:               Left; 07/13/2024: POLYPECTOMY     Comment:  Procedure: POLYPECTOMY, INTESTINE;  Surgeon: Unk Corinn Skiff, MD;  Location: [facility] ENDOSCOPY;  Service:               Gastroenterology;; No date: TONSILLECTOMY     Comment:  as a child 03/29/2015: TOTAL KNEE ARTHROPLASTY; Left     Comment:  [facility] Dr. Mardee No date: VASECTOMY 09/25/2024: VENTRAL HERNIA REPAIR; N/A     Comment:  Procedure: REPAIR, HERNIA, VENTRAL;  Surgeon: Jordis Laneta FALCON, MD;  Location: [facility] ORS;  Service: General;                Laterality: N/A;  open proceduren  BMI    Body Mass Index: 22.51 kg/m       Reproductive/Obstetrics negative OB ROS  Anesthesia Physical Anesthesia Plan  ASA: 3  Anesthesia Plan: General   Post-op Pain Management:    Induction: Intravenous  PONV Risk Score and Plan: Ondansetron , Dexamethasone , Midazolam  and Treatment may vary due to age or medical condition  Airway Management Planned: Oral ETT  Additional Equipment:   Intra-op Plan:   Post-operative Plan: Extubation in OR  Informed Consent: I have reviewed the patients History and Physical, chart, labs and discussed the procedure including the risks, benefits and alternatives for the proposed anesthesia with the patient or authorized representative who has indicated his/her understanding and acceptance.     Dental Advisory Given  Plan Discussed with: CRNA  Anesthesia Plan Comments:         Anesthesia Quick Evaluation

## 2024-10-27 NOTE — Anesthesia Procedure Notes (Signed)
 Procedure Name: Intubation Date/Time: 10/27/2024 9:12 AM  Performed by: Lacretia Camelia NOVAK, CRNAPre-anesthesia Checklist: Patient identified, Patient being monitored, Timeout performed, Emergency Drugs available and Suction available Patient Re-evaluated:Patient Re-evaluated prior to induction Oxygen  Delivery Method: Circle system utilized Preoxygenation: Pre-oxygenation with 100% oxygen  Induction Type: IV induction Ventilation: Mask ventilation without difficulty Laryngoscope Size: Mac and 4 Grade View: Grade I Tube type: Oral Tube size: 7.5 mm Number of attempts: 1 Airway Equipment and Method: Stylet Placement Confirmation: ETT inserted through vocal cords under direct vision, positive ETCO2 and breath sounds checked- equal and bilateral Secured at: 21 cm Tube secured with: Tape Dental Injury: Teeth and Oropharynx as per pre-operative assessment

## 2024-10-27 NOTE — Anesthesia Postprocedure Evaluation (Signed)
 Anesthesia Post Note  Patient: Gary Mueller  Procedure(s) Performed: ENUCLEATION, PROSTATE, USING LASER, WITH MORCELLATION (Prostate)  Patient location during evaluation: PACU Anesthesia Type: General Level of consciousness: awake Pain management: satisfactory to patient Vital Signs Assessment: post-procedure vital signs reviewed and stable Respiratory status: spontaneous breathing Cardiovascular status: stable Anesthetic complications: no   No notable events documented.   Last Vitals:  Vitals:   10/27/24 1100 10/27/24 1116  BP: 136/71 (!) 147/60  Pulse: (!) 56 62  Resp:  18  Temp: 36.4 C   SpO2: 95% 94%    Last Pain:  Vitals:   10/27/24 1116  TempSrc: Temporal  PainSc: 0-No pain                 VAN STAVEREN,Amos Gaber

## 2024-10-27 NOTE — H&P (Signed)
 10/27/24 8:52 AM   Gary Mueller Sayer 1941-10-07 [id number]  CC: Urinary retention  HPI: Comorbid 83 year old male with Foley dependent urinary retention, failed multiple voiding trials and opted for HOLEP.  Prostate measured 70 g on CT.  Likely also has a component of atonic bladder.   PMH: Past Medical History:  Diagnosis Date   Acute gastric ulcer with hemorrhage    Allergy    Anemia    Anxiety    Aortic atherosclerosis    Arthritis    Atrial fibrillation (HCC)    a.) CHA2DS2VASc = 5 (age x 2, CHF, HTN, prior MI/vascular disease) as of 07/25/2024; b.) rate/rhythm maintained on oral metoprolol  succinate; no OAC (does take clopidogrel)   BILATERAL recurrent inguinal hernia    a.) s/p repair RIGHT 02/1996; b.) s/p repair LEFT 03/1996; c.) s/p repair recurrent LEFT 07/2002; d.) planned repair of second recurrence on LEFT 07/2024   BPH (benign prostatic hyperplasia)    CHF (congestive heart failure) (HCC)    CKD stage 3a, GFR 45-59 ml/min (HCC)    Colon polyps    Coronary artery disease    a.) s/p NSTEMI 10/18/2021 with PCI of LCx 10/19/2021 (2.5 x 22 mm Onyx Frontier DES)   Cystoid macular edema of left eye 07/07/2019   Depression    Diverticulosis    DOE (dyspnea on exertion)    Exudative age related macular degeneration (HCC)    GERD (gastroesophageal reflux disease)    HOH (hard of hearing)    Hyperlipidemia    Hypertension    Hypothyroidism    IBS (irritable bowel syndrome)    Ischemic cardiomyopathy    Levoscoliosis of lumbar spine    Long term current use of clopidogrel    NSTEMI (non-ST elevated myocardial infarction) (HCC) 10/18/2021   a.) Troponin peaked at 822 ng/L; b.) LHC 10/19/2021: subtotal occlusion of the LCx (2.5 x 22 mm Onyx Frontier DES)   Pneumonia    Pre-diabetes    RBBB    Right bundle branch block (RBBB)    Sepsis secondary to UTI (HCC)    Skin cancer, basal cell 10/20/2016   a.) lip, neck   Status post bilateral cataract extraction 2019    Symptomatic PVCs    Tinnitus of both ears    Ventral hernia     Surgical History: Past Surgical History:  Procedure Laterality Date   ANTERIOR VITRECTOMY Left 11/28/2018   Procedure: ANTERIOR VITRECTOMY;  Surgeon: Ferol Rogue, MD;  Location: [facility] ORS;  Service: Ophthalmology;  Laterality: Left;   CATARACT EXTRACTION W/PHACO Right 10/10/2018   Procedure: CATARACT EXTRACTION PHACO AND INTRAOCULAR LENS PLACEMENT (IOC);  Surgeon: Ferol Rogue, MD;  Location: [facility] OR[facility];  Service: Ophthalmology;  Laterality: Right;  US  00:41 CDE 6.87 Fluid pack lot # [id number]   CATARACT EXTRACTION W/PHACO Left 11/28/2018   Procedure: CATARACT EXTRACTION PHACO AND INTRAOCULAR LENS PLACEMENT (IOC)-LEFT;  Surgeon: Ferol Rogue, MD;  Location: [facility] OR[facility];  Service: Ophthalmology;  Laterality: Left;  US  00:47.7 CDE 8.92 Fluid Pack lot # [id number]   CHOLECYSTECTOMY  1968   CHONDROPLASTY Right 01/02/2018   Procedure: CHONDROPLASTY;  Surgeon: Mardee Lynwood SQUIBB, MD;  Location: [facility];  Service: Orthopedics;  Laterality: Right;   COLONOSCOPY  2014   COLONOSCOPY N/A 07/13/2024   Procedure: COLONOSCOPY;  Surgeon: Unk Corinn Skiff, MD;  Location: [facility] ENDOSCOPY;  Service: Gastroenterology;  Laterality: N/A;   CORONARY STENT INTERVENTION N/A 10/19/2021   Procedure: CORONARY STENT INTERVENTION;  Surgeon: Ammon Blunt, MD;  Location: [facility]  INVASIVE CV LAB;  Service: Cardiovascular;  Laterality: N/A;   ESOPHAGOGASTRODUODENOSCOPY N/A 07/13/2024   Procedure: EGD (ESOPHAGOGASTRODUODENOSCOPY);  Surgeon: Unk Corinn Skiff, MD;  Location: [facility] ENDOSCOPY;  Service: Gastroenterology;  Laterality: N/A;   HERNIA REPAIR Left 04/18/1996   inguinal hernia repair/ Dr Dessa   HERNIA REPAIR Right 02/26/1996   Dr Dessa   HERNIA REPAIR Left 08/07/2002   Dr Dessa   HERNIORRHAPHY, INGUINAL, ROBOT-ASSISTED, LAPAROSCOPIC Left 07/29/2024   Procedure: HERNIORRHAPHY, INGUINAL, ROBOT-ASSISTED, LAPAROSCOPIC;  Surgeon: Jordis Laneta FALCON, MD;  Location: [facility] OR[facility];  Service: General;  Laterality: Left;   INSERTION OF MESH  07/29/2024   Procedure: INSERTION OF MESH;  Surgeon: Jordis Laneta FALCON, MD;  Location: [facility];  Service: General;;   INSERTION OF MESH  09/25/2024   Procedure: INSERTION OF MESH;  Surgeon: Jordis Laneta FALCON, MD;  Location: [facility];  Service: General;;   JOINT REPLACEMENT     KNEE ARTHROPLASTY Right 04/30/2020   Procedure: COMPUTER ASSISTED TOTAL KNEE ARTHROPLASTY;  Surgeon: Mardee Lynwood SQUIBB, MD;  Location: [facility];  Service: Orthopedics;  Laterality: Right;   KNEE ARTHROSCOPY Right 01/02/2018   Procedure: ARTHROSCOPY KNEE;  Surgeon: Mardee Lynwood SQUIBB, MD;  Location: [facility];  Service: Orthopedics;  Laterality: Right;   KNEE ARTHROSCOPY Left 02/14/2012   partial menisectomy and chondroplasty   KNEE ARTHROSCOPY WITH MEDIAL MENISECTOMY Right 01/02/2018   Procedure: KNEE ARTHROSCOPY WITH MEDIAL MENISECTOMY;  Surgeon: Mardee Lynwood SQUIBB, MD;  Location: [facility];  Service: Orthopedics;  Laterality: Right;   LEFT HEART CATH AND CORONARY ANGIOGRAPHY N/A 10/19/2021   Procedure: LEFT HEART CATH AND CORONARY ANGIOGRAPHY;  Surgeon: Ammon Blunt, MD;  Location: [facility] INVASIVE CV LAB;  Service: Cardiovascular;  Laterality: N/A;   LEFT HEART CATH AND CORONARY ANGIOGRAPHY Left 10/10/2022   Procedure: LEFT HEART CATH AND CORONARY ANGIOGRAPHY;  Surgeon: Ammon Blunt, MD;  Location: [facility] INVASIVE CV LAB;  Service: Cardiovascular;  Laterality: Left;   POLYPECTOMY  07/13/2024   Procedure: POLYPECTOMY, INTESTINE;  Surgeon: Unk Corinn Skiff, MD;  Location: [facility] ENDOSCOPY;  Service: Gastroenterology;;   TONSILLECTOMY     as a child   TOTAL KNEE ARTHROPLASTY Left 03/29/2015   [facility] Dr. Mardee   VASECTOMY     VENTRAL HERNIA REPAIR N/A 09/25/2024   Procedure: REPAIR, HERNIA, VENTRAL;  Surgeon: Jordis Laneta FALCON, MD;  Location: [facility] ORS;  Service: General;  Laterality: N/A;  open proceduren     Family History: Family History   Problem Relation Age of Onset   Atrial fibrillation Mother    Healthy Sister     Social History:  reports that he quit smoking about 59 years ago. His smoking use included cigarettes. He has never used smokeless tobacco. He reports current alcohol  use. He reports that he does not use drugs.  Physical Exam: BP 136/70   Pulse 64   Temp (!) 96.9 F (36.1 C) (Tympanic)   Resp 18   Ht 6' (1.829 m)   Wt 75.3 kg   SpO2 96%   BMI 22.51 kg/m    Constitutional:  Alert and oriented, No acute distress. Cardiovascular: Regular rate and rhythm Respiratory: Clear to auscultation bilaterally GI: Abdomen is soft, nontender, nondistended, no abdominal masses   Laboratory Data: Preop urine culture 10/8 with Klebsiella, treated with culture appropriate antibiotics  Assessment & Plan:   83 year old male with 70 g prostate and Foley dependent urinary retention who opted for HOLEP.  We discussed the risks and benefits of HoLEP at length.  The  procedure requires general anesthesia and takes 1 to 2 hours, and a holmium laser is used to enucleate the prostate and push this tissue into the bladder.  A morcellator is then used to remove this tissue, which is sent for pathology.  The vast majority(>95%) of patients are able to discharge the same day with a catheter in place for 2 to 3 days, and will follow-up in clinic for a voiding trial.  We specifically discussed the risks of bleeding, infection, retrograde ejaculation, temporary urgency and urge incontinence, very low risk of long-term incontinence, urethral stricture/bladder neck contracture, pathologic evaluation of prostate tissue and possible detection of prostate cancer or other malignancy, and possible need for additional procedures.  HOLEP today   Redell Burnet, MD 10/27/2024  [facility] [address], [address] [place], [place] [address] [phone]

## 2024-10-27 NOTE — Transfer of Care (Signed)
 Immediate Anesthesia Transfer of Care Note  Patient: Gary Mueller  Procedure(s) Performed: ENUCLEATION, PROSTATE, USING LASER, WITH MORCELLATION (Prostate)  Patient Location: PACU  Anesthesia Type:General  Level of Consciousness: awake, alert , oriented, and patient cooperative  Airway & Oxygen  Therapy: Patient Spontanous Breathing and Patient connected to face mask oxygen   Post-op Assessment: Report given to RN, Post -op Vital signs reviewed and stable, and Patient moving all extremities X 4  Post vital signs: Reviewed and stable  Last Vitals:  Vitals Value Taken Time  BP 125/79 10/27/24 10:06  Temp    Pulse 55 10/27/24 10:05  Resp 18 10/27/24 10:06  SpO2 100 % 10/27/24 10:06  Vitals shown include unfiled device data.  Last Pain:  Vitals:   10/27/24 0809  TempSrc: Tympanic  PainSc: 0-No pain         Complications: No notable events documented.

## 2024-10-28 ENCOUNTER — Encounter: Payer: Self-pay | Admitting: Urology

## 2024-10-28 LAB — SURGICAL PATHOLOGY

## 2024-10-29 ENCOUNTER — Ambulatory Visit (INDEPENDENT_AMBULATORY_CARE_PROVIDER_SITE_OTHER): Admitting: Physician Assistant

## 2024-10-29 VITALS — BP 156/74 | HR 58 | Ht 72.0 in | Wt 166.0 lb

## 2024-10-29 DIAGNOSIS — N401 Enlarged prostate with lower urinary tract symptoms: Secondary | ICD-10-CM

## 2024-10-29 DIAGNOSIS — R338 Other retention of urine: Secondary | ICD-10-CM

## 2024-10-29 MED ORDER — CIPROFLOXACIN HCL 500 MG PO TABS
500.0000 mg | ORAL_TABLET | Freq: Once | ORAL | Status: AC
  Administered 2024-10-29: 500 mg via ORAL

## 2024-10-29 NOTE — Progress Notes (Signed)
 Catheter Removal  Patient is present today for a catheter removal.  56ml of water was drained from the balloon. A 24FR three-way foley cath was removed from the bladder, no complications were noted. Patient tolerated well.  Performed by: Anetta Olvera, PA-C  Additional notes: Counseled patient on normal postoperative findings including dysuria, gross hematuria, and urinary urgency/leakage. Counseled patient to begin Kegel exercises 3x10 sets daily to increase urinary control and wear absorbent products as needed for security. Written and verbal resources provided today. Surgical pathology benign; results shared with patient.  Cipro 500mg  x1 dose administered prior to Foley removal as above.   Follow up/ Additional notes: Return in about 4 months (around 02/26/2025) for Postop follow up with Dr. Francisca.

## 2024-10-29 NOTE — Patient Instructions (Signed)

## 2024-12-04 ENCOUNTER — Telehealth: Payer: Self-pay

## 2024-12-04 NOTE — Telephone Encounter (Signed)
 Pts daughter called and left message stating that she was told to call and let us  know of any changes in medical status of patient. Pt had labs drawn by PCP and theres a few that are high that she would like you to be aware of.

## 2024-12-11 ENCOUNTER — Other Ambulatory Visit: Payer: Self-pay

## 2024-12-11 ENCOUNTER — Other Ambulatory Visit

## 2024-12-11 ENCOUNTER — Telehealth: Payer: Self-pay | Admitting: Physician Assistant

## 2024-12-11 DIAGNOSIS — N401 Enlarged prostate with lower urinary tract symptoms: Secondary | ICD-10-CM

## 2024-12-11 DIAGNOSIS — R338 Other retention of urine: Secondary | ICD-10-CM

## 2024-12-11 DIAGNOSIS — N138 Other obstructive and reflux uropathy: Secondary | ICD-10-CM

## 2024-12-11 DIAGNOSIS — R300 Dysuria: Secondary | ICD-10-CM

## 2024-12-11 LAB — URINALYSIS, COMPLETE
Bilirubin, UA: NEGATIVE
Glucose, UA: NEGATIVE
Ketones, UA: NEGATIVE
Nitrite, UA: NEGATIVE
Specific Gravity, UA: 1.02 (ref 1.005–1.030)
Urobilinogen, Ur: 0.2 mg/dL (ref 0.2–1.0)
pH, UA: 6 (ref 5.0–7.5)

## 2024-12-11 LAB — MICROSCOPIC EXAMINATION: WBC, UA: >30 /HPF — AB (ref 0–5)

## 2024-12-11 MED ORDER — DOXYCYCLINE HYCLATE 100 MG PO CAPS: 100.0000 mg | ORAL_CAPSULE | Freq: Two times a day (BID) | ORAL | 0 refills | Status: AC

## 2024-12-11 NOTE — Telephone Encounter (Signed)
 His UA from today looks a little suspicious for UTI.  While we wait for his culture results to come back, lets go ahead and start him on Doxy 100  mg twice daily x 7 days.  I just sent in the prescription to [facility].  Please have him hold his iron  supplements while on this antibiotic and take it with food to reduce stomach upset.

## 2024-12-15 ENCOUNTER — Ambulatory Visit: Payer: Self-pay | Admitting: Urology

## 2024-12-15 DIAGNOSIS — R300 Dysuria: Secondary | ICD-10-CM

## 2024-12-15 LAB — CULTURE, URINE COMPREHENSIVE

## 2024-12-16 MED ORDER — PHENAZOPYRIDINE HCL 200 MG PO TABS: 200.0000 mg | ORAL_TABLET | Freq: Three times a day (TID) | ORAL | 0 refills | Status: AC

## 2024-12-16 NOTE — Telephone Encounter (Signed)
 Spoke with patient in regards to sending in Pyridum to the pharmacy for dysuria. Patient verbalized understanding that medication should be taken 3 times a day for 2 days. Follow-up appointment was made to see S. McGowan PA.  Andrea Kirks LPN

## 2024-12-30 NOTE — Progress Notes (Signed)
" °  Dermatology Note   Assessment and Plan:    Assessment & Plan  History of NMSC:   No evidence of recurrence The importance of regular skin checks and sun protection was reviewed If pt experiences any tenderness at site or any regrowth/nodularity, he/she should contact our office for further evaluation   Seborrheic Keratoses:  I counseled the patient regarding the following:  Seborrheic keratoses are benign and no treatment is necessary. Lesions can be warty, smooth, flat or raised. Patients can get more of these lesions as they age.   If lesions change and become enlarged, tender, burn or change colors, pt was instructed to contact us  for further evaluation.   Benign appearing lentigines and actinic skin damage:  Pt was reassured.  No concerning lesions warranting biopsy The importance of sun protection and using OTC  broad spectrum, SPF 30 or higher sunscreen was reviewed.     The patient was advised to call for an appointment should any new, changing, or symptomatic lesions develop.   RTC: 9 mo  _________________________________________________________________   Chief Complaint   Chief Complaint  Patient presents with   Follow-up    Pt expresses no new problems    HPI   Gary Mueller is a 84 y.o. male who presents as a returning patient (last seen 03/11/2024) to Dermatology for a full body skin exam. Patient reports no specific lesions or rash of concern.   History of Present Illness Gary Mueller is an 84 year old male who presents with a dermatology follow-up visit.  He has no current skin concerns except for a small spot on his torso that he would like evaluated.  He recently had multiple surgeries including hernia repairs and a laser bladder procedure and developed sepsis during that period. He reports he is now recovering and remains active.   The patient denies any other new or changing lesions or areas of concern.   Pertinent Past Medical History   Problem  List       Other   History of nonmelanoma skin cancer - Primary   -SCCis on lower [place] s/p Mohs 2021      Past Medical History, Family History, Social History, Medication List, Allergies, and Problem List were reviewed in the rooming section of Epic.   Physical Examination   Physical Exam: Patient is a well developed caucasian male in no apparent distress.  Alert and oriented x 3.  Skin: Examination of the patient's head, neck, chest, abdomen, back, buttocks, bilateral upper and lower extremities was performed and all areas not specifically commented on were within normal limits: - scattered tan macules, rhytids and speckled pigment change c/w previous sun exposure  - well-healed scars at sites of previous excisions/biopsies  - several scattered stuck-on tan macules and papules over trunk and extremities         (Approved Template 01/26/2024) "

## 2025-01-05 ENCOUNTER — Ambulatory Visit: Admitting: Urology

## 2025-01-05 NOTE — Progress Notes (Unsigned)
 "    01/06/2025 3:42 PM   Gary Mueller Aug 03, 1941 [id number]  Referring provider: Fernande Ophelia [name], MD [address] [facility] [place],  [place] [address]  Urological history: 1. BPH with LU TS - HoLEP (10/2024) - negative pathology  2. Remote history of vasectomy  Chief Complaint  Patient presents with   Follow-up   HPI: Gary Mueller is a 84 y.o. man who presents today for dysuria.    Previous records reviewed.  He was seen by his PCP back in December and they performed a urinalysis for dysuria since his catheter was removed which noted greater than 182 WBCs and 107 RBCs.  He was started on doxycycline .  He called back after completing his antibiotic stating he was still having dysuria.  He was advised to take Pyridium  and he was scheduled for a follow up appointment.    He states his dysuria has resolved after the antibiotics and the Pyridium .  He states prior to taking the antibiotics his dysuria was a 10 out of 10, now its a 5 out of 10.  He feels he is continuing to improve.  He actually played golf on Friday.  He still has some urge incontinence, but this is minimal.  He continues to do his Kegel exercises.  He states he has a very strong urinary stream and he does not have to strain to urinate.  He also denies any split urinary stream.  He has not had any fevers with the dysuria.   PVR 0 mL   PMH: Past Medical History:  Diagnosis Date   Acute gastric ulcer with hemorrhage    Allergy    Anemia    Anxiety    Aortic atherosclerosis    Arthritis    Atrial fibrillation (HCC)    a.) CHA2DS2VASc = 5 (age x 2, CHF, HTN, prior MI/vascular disease) as of 07/25/2024; b.) rate/rhythm maintained on oral metoprolol  succinate; no OAC (does take clopidogrel)   BILATERAL recurrent inguinal hernia    a.) s/p repair RIGHT 02/1996; b.) s/p repair LEFT 03/1996; c.) s/p repair recurrent LEFT 07/2002; d.) planned repair of second recurrence on LEFT 07/2024   BPH  (benign prostatic hyperplasia)    CHF (congestive heart failure) (HCC)    CKD stage 3a, GFR 45-59 ml/min (HCC)    Colon polyps    Coronary artery disease    a.) s/p NSTEMI 10/18/2021 with PCI of LCx 10/19/2021 (2.5 x 22 mm Onyx Frontier DES)   Cystoid macular edema of left eye 07/07/2019   Depression    Diverticulosis    DOE (dyspnea on exertion)    Exudative age related macular degeneration (HCC)    GERD (gastroesophageal reflux disease)    HOH (hard of hearing)    Hyperlipidemia    Hypertension    Hypothyroidism    IBS (irritable bowel syndrome)    Ischemic cardiomyopathy    Levoscoliosis of lumbar spine    Long term current use of clopidogrel    NSTEMI (non-ST elevated myocardial infarction) (HCC) 10/18/2021   a.) Troponin peaked at 822 ng/L; b.) LHC 10/19/2021: subtotal occlusion of the LCx (2.5 x 22 mm Onyx Frontier DES)   Pneumonia    Pre-diabetes    RBBB    Right bundle branch block (RBBB)    Sepsis secondary to UTI (HCC)    Skin cancer, basal cell 10/20/2016   a.) lip, neck   Status post bilateral cataract extraction 2019   Symptomatic PVCs  Tinnitus of both ears    Ventral hernia     Surgical History: Past Surgical History:  Procedure Laterality Date   ANTERIOR VITRECTOMY Left 11/28/2018   Procedure: ANTERIOR VITRECTOMY;  Surgeon: Ferol Rogue, MD;  Location: [facility];  Service: Ophthalmology;  Laterality: Left;   CATARACT EXTRACTION W/PHACO Right 10/10/2018   Procedure: CATARACT EXTRACTION PHACO AND INTRAOCULAR LENS PLACEMENT (IOC);  Surgeon: Ferol Rogue, MD;  Location: [facility];  Service: Ophthalmology;  Laterality: Right;  US  00:41 CDE 6.87 Fluid pack lot # [id number]   CATARACT EXTRACTION W/PHACO Left 11/28/2018   Procedure: CATARACT EXTRACTION PHACO AND INTRAOCULAR LENS PLACEMENT (IOC)-LEFT;  Surgeon: Ferol Rogue, MD;  Location: [facility];  Service: Ophthalmology;  Laterality: Left;  US  00:47.7 CDE 8.92 Fluid Pack lot # [id number]   CHOLECYSTECTOMY   1968   CHONDROPLASTY Right 01/02/2018   Procedure: CHONDROPLASTY;  Surgeon: Mardee Lynwood SQUIBB, MD;  Location: [facility];  Service: Orthopedics;  Laterality: Right;   COLONOSCOPY  2014   COLONOSCOPY N/A 07/13/2024   Procedure: COLONOSCOPY;  Surgeon: Unk Corinn Skiff, MD;  Location: [facility];  Service: Gastroenterology;  Laterality: N/A;   CORONARY STENT INTERVENTION N/A 10/19/2021   Procedure: CORONARY STENT INTERVENTION;  Surgeon: Ammon Blunt, MD;  Location: [facility];  Service: Cardiovascular;  Laterality: N/A;   ESOPHAGOGASTRODUODENOSCOPY N/A 07/13/2024   Procedure: EGD (ESOPHAGOGASTRODUODENOSCOPY);  Surgeon: Unk Corinn Skiff, MD;  Location: [facility];  Service: Gastroenterology;  Laterality: N/A;   HERNIA REPAIR Left 04/18/1996   inguinal hernia repair/ Dr Dessa   HERNIA REPAIR Right 02/26/1996   Dr Dessa   HERNIA REPAIR Left 08/07/2002   Dr Dessa   HERNIORRHAPHY, INGUINAL, ROBOT-ASSISTED, LAPAROSCOPIC Left 07/29/2024   Procedure: HERNIORRHAPHY, INGUINAL, ROBOT-ASSISTED, LAPAROSCOPIC;  Surgeon: Jordis Laneta FALCON, MD;  Location: [facility];  Service: General;  Laterality: Left;   HOLEP-LASER ENUCLEATION OF THE PROSTATE WITH MORCELLATION N/A 10/27/2024   Procedure: ENUCLEATION, PROSTATE, USING LASER, WITH MORCELLATION;  Surgeon: Francisca Rogue BROCKS, MD;  Location: [facility];  Service: Urology;  Laterality: N/A;   INSERTION OF MESH  07/29/2024   Procedure: INSERTION OF MESH;  Surgeon: Jordis Laneta FALCON, MD;  Location: [facility];  Service: General;;   INSERTION OF MESH  09/25/2024   Procedure: INSERTION OF MESH;  Surgeon: Jordis Laneta FALCON, MD;  Location: [facility];  Service: General;;   JOINT REPLACEMENT     KNEE ARTHROPLASTY Right 04/30/2020   Procedure: COMPUTER ASSISTED TOTAL KNEE ARTHROPLASTY;  Surgeon: Mardee Lynwood SQUIBB, MD;  Location: [facility];  Service: Orthopedics;  Laterality: Right;   KNEE ARTHROSCOPY Right 01/02/2018   Procedure: ARTHROSCOPY KNEE;  Surgeon:  Mardee Lynwood SQUIBB, MD;  Location: [facility];  Service: Orthopedics;  Laterality: Right;   KNEE ARTHROSCOPY Left 02/14/2012   partial menisectomy and chondroplasty   KNEE ARTHROSCOPY WITH MEDIAL MENISECTOMY Right 01/02/2018   Procedure: KNEE ARTHROSCOPY WITH MEDIAL MENISECTOMY;  Surgeon: Mardee Lynwood SQUIBB, MD;  Location: [facility];  Service: Orthopedics;  Laterality: Right;   LEFT HEART CATH AND CORONARY ANGIOGRAPHY N/A 10/19/2021   Procedure: LEFT HEART CATH AND CORONARY ANGIOGRAPHY;  Surgeon: Ammon Blunt, MD;  Location: [facility];  Service: Cardiovascular;  Laterality: N/A;   LEFT HEART CATH AND CORONARY ANGIOGRAPHY Left 10/10/2022   Procedure: LEFT HEART CATH AND CORONARY ANGIOGRAPHY;  Surgeon: Ammon Blunt, MD;  Location: [facility];  Service: Cardiovascular;  Laterality: Left;   POLYPECTOMY  07/13/2024   Procedure: POLYPECTOMY, INTESTINE;  Surgeon: Unk Corinn Skiff, MD;  Location: [facility];  Service: Gastroenterology;;   TONSILLECTOMY     as a child   TOTAL KNEE ARTHROPLASTY Left 03/29/2015   [facility] Dr. Mardee   VASECTOMY     VENTRAL HERNIA REPAIR N/A 09/25/2024   Procedure: REPAIR, HERNIA, VENTRAL;  Surgeon: Jordis Laneta FALCON, MD;  Location: [facility];  Service: General;  Laterality: N/A;  open proceduren    Home Medications:  Allergies as of 01/06/2025       Reactions   Codeine    GI intolerance; hallucinations    Statins    Muscle pain   Tamsulosin  Cough        Medication List        Accurate as of January 06, 2025  3:42 PM. If you have any questions, ask your nurse or doctor.          amLODipine  5 MG tablet Commonly known as: NORVASC  Take 5 mg by mouth every morning.   ascorbic acid  1000 MG tablet Commonly known as: VITAMIN C Take 1,000 mg by mouth at bedtime.   cyanocobalamin  1000 MCG tablet Take 1 tablet (1,000 mcg total) by mouth daily.   ezetimibe  10 MG tablet Commonly known as: ZETIA  Take 10 mg by mouth daily.    levothyroxine  75 MCG tablet Commonly known as: SYNTHROID  Take 75 mcg by mouth daily before breakfast.   losartan  50 MG tablet Commonly known as: COZAAR  Take 50 mg by mouth daily.   metoprolol  succinate 25 MG 24 hr tablet Commonly known as: TOPROL -XL TAKE 1/2 TABLET BY MOUTH EVERY EVENING   multivitamin with minerals Tabs tablet Take 1 tablet by mouth every evening.   pantoprazole  40 MG tablet Commonly known as: PROTONIX  Take 1 tablet (40 mg total) by mouth 2 (two) times daily before a meal.   rosuvastatin  40 MG tablet Commonly known as: CRESTOR  TAKE ONE TABLET BY MOUTH EVERY DAY   sulfamethoxazole -trimethoprim  800-160 MG tablet Commonly known as: BACTRIM  DS Take 1 tablet by mouth 2 (two) times daily.   Systane 0.4-0.3 % Soln Generic drug: Polyethyl Glycol-Propyl Glycol Place 1-2 drops into both eyes 3 (three) times daily as needed (dry/irritated eyes.).        Allergies: Allergies[1]  Family History: Family History  Problem Relation Age of Onset   Atrial fibrillation Mother    Healthy Sister     Social History:  reports that he quit smoking about 60 years ago. His smoking use included cigarettes. He smoked an average of 1.5 packs per day. He has never used smokeless tobacco. He reports current alcohol  use. He reports that he does not use drugs.  ROS: Pertinent ROS in HPI  Physical Exam: BP (!) 151/76   Pulse 60   Wt 166 lb (75.3 kg)   SpO2 95%   BMI 22.51 kg/m   Constitutional:  Well nourished. Alert and oriented, No acute distress. HEENT: [place] AT, moist mucus membranes.  Trachea midline Cardiovascular: No clubbing, cyanosis, or edema. Respiratory: Normal respiratory effort, no increased work of breathing. Neurologic: Grossly intact, no focal deficits, moving all 4 extremities. Psychiatric: Normal mood and affect.  Laboratory Data: See Epic and HPI   I have reviewed the labs.   Pertinent Imaging:  01/06/25 15:29  Scan Result 0mL    Assessment  & Plan:    1. Dysuria -    2. BPH with LU TS - PVR ***  No follow-ups on file.  These notes generated with voice recognition software. I apologize for typographical errors.  Gary Mueller  Gary Mueller  [facility] [address]  [address] [place], [place] [address] [phone]     [1]  Allergies Allergen Reactions   Codeine     GI intolerance; hallucinations    Statins     Muscle pain   Tamsulosin  Cough   "

## 2025-01-06 ENCOUNTER — Ambulatory Visit: Admitting: Urology

## 2025-01-06 VITALS — BP 151/76 | HR 60 | Wt 166.0 lb

## 2025-01-06 DIAGNOSIS — N401 Enlarged prostate with lower urinary tract symptoms: Secondary | ICD-10-CM

## 2025-01-06 DIAGNOSIS — N138 Other obstructive and reflux uropathy: Secondary | ICD-10-CM

## 2025-01-06 DIAGNOSIS — R300 Dysuria: Secondary | ICD-10-CM

## 2025-01-06 LAB — BLADDER SCAN AMB NON-IMAGING

## 2025-01-19 ENCOUNTER — Ambulatory Visit: Admitting: Surgery

## 2025-01-26 ENCOUNTER — Ambulatory Visit: Admitting: Surgery

## 2025-01-29 ENCOUNTER — Other Ambulatory Visit (HOSPITAL_COMMUNITY): Payer: Self-pay

## 2025-02-02 ENCOUNTER — Ambulatory Visit: Admitting: Surgery

## 2025-02-18 ENCOUNTER — Ambulatory Visit: Admitting: Urology
